# Patient Record
Sex: Female | Born: 1970 | Race: Black or African American | Hispanic: No | Smoking: Never smoker
Health system: Southern US, Community
[De-identification: ages and names within clinical notes are randomized; demographics above are authoritative.]

## PROBLEM LIST (undated history)

## (undated) ENCOUNTER — Encounter

## (undated) ENCOUNTER — Ambulatory Visit: Payer: MEDICARE | Attending: Psychologist | Primary: Psychologist

## (undated) ENCOUNTER — Encounter: Attending: Family Medicine | Primary: Family Medicine

## (undated) ENCOUNTER — Encounter: Attending: Anesthesiology | Primary: Anesthesiology

## (undated) ENCOUNTER — Encounter: Payer: MEDICARE | Attending: Clinical | Primary: Clinical

## (undated) ENCOUNTER — Telehealth: Attending: Family Medicine | Primary: Family Medicine

## (undated) ENCOUNTER — Ambulatory Visit: Payer: MEDICARE | Attending: Pain Medicine | Primary: Pain Medicine

## (undated) ENCOUNTER — Ambulatory Visit: Payer: MEDICARE

## (undated) ENCOUNTER — Telehealth

## (undated) ENCOUNTER — Encounter: Attending: Gynecology | Primary: Gynecology

## (undated) ENCOUNTER — Ambulatory Visit: Payer: MEDICARE | Attending: Family | Primary: Family

## (undated) ENCOUNTER — Ambulatory Visit: Payer: Medicare (Managed Care)

## (undated) ENCOUNTER — Encounter: Attending: Obstetrics & Gynecology | Primary: Obstetrics & Gynecology

## (undated) ENCOUNTER — Encounter: Attending: Sports Medicine | Primary: Sports Medicine

## (undated) ENCOUNTER — Ambulatory Visit
Payer: MEDICARE | Attending: Rehabilitative and Restorative Service Providers" | Primary: Rehabilitative and Restorative Service Providers"

## (undated) ENCOUNTER — Ambulatory Visit: Payer: Medicare (Managed Care) | Attending: Family Medicine | Primary: Family Medicine

## (undated) ENCOUNTER — Ambulatory Visit: Payer: MEDICARE | Attending: Internal Medicine | Primary: Internal Medicine

## (undated) ENCOUNTER — Encounter: Attending: Psychologist | Primary: Psychologist

## (undated) ENCOUNTER — Telehealth: Attending: Internal Medicine | Primary: Internal Medicine

## (undated) ENCOUNTER — Ambulatory Visit: Payer: MEDICARE | Attending: Sports Medicine | Primary: Sports Medicine

## (undated) ENCOUNTER — Ambulatory Visit

## (undated) ENCOUNTER — Ambulatory Visit: Payer: MEDICARE | Attending: Family Medicine | Primary: Family Medicine

## (undated) ENCOUNTER — Encounter: Attending: Family | Primary: Family

## (undated) ENCOUNTER — Telehealth: Attending: Sports Medicine | Primary: Sports Medicine

## (undated) ENCOUNTER — Encounter: Attending: Internal Medicine | Primary: Internal Medicine

## (undated) ENCOUNTER — Encounter: Payer: MEDICARE | Attending: Family Medicine | Primary: Family Medicine

## (undated) ENCOUNTER — Non-Acute Institutional Stay: Payer: MEDICARE

## (undated) ENCOUNTER — Encounter
Payer: MEDICARE | Attending: Rehabilitative and Restorative Service Providers" | Primary: Rehabilitative and Restorative Service Providers"

## (undated) ENCOUNTER — Encounter: Payer: MEDICARE | Attending: Gynecology | Primary: Gynecology

## (undated) ENCOUNTER — Ambulatory Visit: Payer: MEDICARE | Attending: Anesthesiology | Primary: Anesthesiology

## (undated) ENCOUNTER — Ambulatory Visit: Payer: MEDICARE | Attending: "Endocrinology | Primary: "Endocrinology

## (undated) ENCOUNTER — Encounter
Attending: Student in an Organized Health Care Education/Training Program | Primary: Student in an Organized Health Care Education/Training Program

## (undated) ENCOUNTER — Ambulatory Visit: Payer: Medicare (Managed Care) | Attending: Sports Medicine | Primary: Sports Medicine

## (undated) ENCOUNTER — Ambulatory Visit: Attending: Pain Medicine | Primary: Pain Medicine

## (undated) ENCOUNTER — Encounter: Payer: MEDICARE | Attending: "Endocrinology | Primary: "Endocrinology

## (undated) ENCOUNTER — Encounter: Payer: MEDICARE | Attending: Registered" | Primary: Registered"

## (undated) ENCOUNTER — Ambulatory Visit: Attending: Registered" | Primary: Registered"

## (undated) ENCOUNTER — Ambulatory Visit: Payer: MEDICARE | Attending: Obstetrics & Gynecology | Primary: Obstetrics & Gynecology

## (undated) ENCOUNTER — Ambulatory Visit
Payer: Medicare (Managed Care) | Attending: Rehabilitative and Restorative Service Providers" | Primary: Rehabilitative and Restorative Service Providers"

## (undated) ENCOUNTER — Ambulatory Visit: Payer: MEDICARE | Attending: Vascular Neurology | Primary: Vascular Neurology

## (undated) ENCOUNTER — Encounter: Payer: MEDICARE | Attending: Anesthesiology | Primary: Anesthesiology

## (undated) ENCOUNTER — Non-Acute Institutional Stay: Payer: MEDICARE | Attending: Family Medicine | Primary: Family Medicine

## (undated) ENCOUNTER — Encounter: Attending: "Endocrinology | Primary: "Endocrinology

## (undated) ENCOUNTER — Encounter: Attending: Pain Medicine | Primary: Pain Medicine

## (undated) ENCOUNTER — Ambulatory Visit: Payer: MEDICAID | Attending: Clinical | Primary: Clinical

## (undated) ENCOUNTER — Encounter: Payer: Medicare (Managed Care) | Attending: Internal Medicine | Primary: Internal Medicine

## (undated) ENCOUNTER — Ambulatory Visit: Attending: Clinical | Primary: Clinical

## (undated) ENCOUNTER — Encounter: Attending: Clinical | Primary: Clinical

## (undated) ENCOUNTER — Ambulatory Visit: Payer: MEDICARE | Attending: Adult Health | Primary: Adult Health

## (undated) ENCOUNTER — Telehealth
Attending: Rehabilitative and Restorative Service Providers" | Primary: Rehabilitative and Restorative Service Providers"

## (undated) ENCOUNTER — Ambulatory Visit: Payer: MEDICAID | Attending: Sports Medicine | Primary: Sports Medicine

## (undated) ENCOUNTER — Ambulatory Visit: Payer: Medicare (Managed Care) | Attending: Internal Medicine | Primary: Internal Medicine

## (undated) ENCOUNTER — Encounter: Payer: MEDICARE | Attending: Nutritionist | Primary: Nutritionist

## (undated) ENCOUNTER — Ambulatory Visit
Payer: Medicare (Managed Care) | Attending: Student in an Organized Health Care Education/Training Program | Primary: Student in an Organized Health Care Education/Training Program

## (undated) ENCOUNTER — Encounter: Payer: MEDICARE | Attending: Sports Medicine | Primary: Sports Medicine

## (undated) ENCOUNTER — Encounter: Attending: Physical Medicine & Rehabilitation | Primary: Physical Medicine & Rehabilitation

## (undated) ENCOUNTER — Encounter: Payer: Medicare (Managed Care) | Attending: Vascular Neurology | Primary: Vascular Neurology

## (undated) ENCOUNTER — Encounter: Payer: MEDICARE | Attending: Family | Primary: Family

## (undated) ENCOUNTER — Encounter: Payer: MEDICARE | Attending: Internal Medicine | Primary: Internal Medicine

## (undated) ENCOUNTER — Institutional Professional Consult (permissible substitution): Payer: MEDICARE

## (undated) ENCOUNTER — Ambulatory Visit: Attending: Pharmacist | Primary: Pharmacist

## (undated) DIAGNOSIS — G43909 Migraine, unspecified, not intractable, without status migrainosus: Secondary | ICD-10-CM

## (undated) DIAGNOSIS — E079 Disorder of thyroid, unspecified: Secondary | ICD-10-CM

## (undated) DIAGNOSIS — M549 Dorsalgia, unspecified: Secondary | ICD-10-CM

## (undated) HISTORY — PX: BREAST LUMPECTOMY: SHX2

## (undated) HISTORY — PX: BACK SURGERY: SHX140

## (undated) HISTORY — PX: ABDOMINAL HYSTERECTOMY: SHX81

---
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
## 1998-04-28 ENCOUNTER — Emergency Department (HOSPITAL_COMMUNITY): Admission: EM | Admit: 1998-04-28 | Discharge: 1998-04-28 | Payer: Self-pay | Admitting: Emergency Medicine

## 1998-05-13 ENCOUNTER — Encounter: Admission: RE | Admit: 1998-05-13 | Discharge: 1998-08-11 | Payer: Self-pay

## 1998-05-28 ENCOUNTER — Encounter: Admission: RE | Admit: 1998-05-28 | Discharge: 1998-05-28 | Payer: Self-pay | Admitting: Family Medicine

## 1998-09-19 ENCOUNTER — Other Ambulatory Visit: Admission: RE | Admit: 1998-09-19 | Discharge: 1998-09-19 | Payer: Self-pay | Admitting: *Deleted

## 1998-09-19 ENCOUNTER — Encounter: Admission: RE | Admit: 1998-09-19 | Discharge: 1998-09-19 | Payer: Self-pay | Admitting: Family Medicine

## 1998-09-23 ENCOUNTER — Encounter: Admission: RE | Admit: 1998-09-23 | Discharge: 1998-09-23 | Payer: Self-pay | Admitting: Family Medicine

## 1998-12-06 ENCOUNTER — Emergency Department (HOSPITAL_COMMUNITY): Admission: EM | Admit: 1998-12-06 | Discharge: 1998-12-06 | Payer: Self-pay | Admitting: Emergency Medicine

## 1999-02-10 ENCOUNTER — Encounter: Admission: RE | Admit: 1999-02-10 | Discharge: 1999-02-10 | Payer: Self-pay | Admitting: Sports Medicine

## 1999-03-24 ENCOUNTER — Encounter: Admission: RE | Admit: 1999-03-24 | Discharge: 1999-03-24 | Payer: Self-pay | Admitting: Sports Medicine

## 1999-03-27 ENCOUNTER — Encounter: Admission: RE | Admit: 1999-03-27 | Discharge: 1999-03-27 | Payer: Self-pay | Admitting: Family Medicine

## 1999-06-08 ENCOUNTER — Encounter: Admission: RE | Admit: 1999-06-08 | Discharge: 1999-06-08 | Payer: Self-pay | Admitting: Family Medicine

## 1999-06-25 ENCOUNTER — Emergency Department (HOSPITAL_COMMUNITY): Admission: EM | Admit: 1999-06-25 | Discharge: 1999-06-25 | Payer: Self-pay | Admitting: Emergency Medicine

## 1999-07-08 ENCOUNTER — Encounter: Admission: RE | Admit: 1999-07-08 | Discharge: 1999-07-08 | Payer: Self-pay | Admitting: Family Medicine

## 2001-06-20 ENCOUNTER — Encounter: Admission: RE | Admit: 2001-06-20 | Discharge: 2001-09-18 | Payer: Self-pay | Admitting: Anesthesiology

## 2001-07-06 ENCOUNTER — Encounter: Admission: RE | Admit: 2001-07-06 | Discharge: 2001-10-04 | Payer: Self-pay | Admitting: Anesthesiology

## 2001-12-14 ENCOUNTER — Ambulatory Visit (HOSPITAL_COMMUNITY): Admission: RE | Admit: 2001-12-14 | Discharge: 2001-12-14 | Payer: Self-pay | Admitting: Obstetrics and Gynecology

## 2001-12-14 ENCOUNTER — Encounter: Payer: Self-pay | Admitting: Obstetrics and Gynecology

## 2002-01-09 ENCOUNTER — Encounter: Admission: RE | Admit: 2002-01-09 | Discharge: 2002-01-09 | Payer: Self-pay | Admitting: Urology

## 2002-01-09 ENCOUNTER — Encounter: Payer: Self-pay | Admitting: Urology

## 2002-01-26 ENCOUNTER — Ambulatory Visit (HOSPITAL_COMMUNITY): Admission: RE | Admit: 2002-01-26 | Discharge: 2002-01-26 | Payer: Self-pay

## 2002-04-11 ENCOUNTER — Encounter (INDEPENDENT_AMBULATORY_CARE_PROVIDER_SITE_OTHER): Payer: Self-pay

## 2002-04-11 ENCOUNTER — Ambulatory Visit (HOSPITAL_COMMUNITY): Admission: RE | Admit: 2002-04-11 | Discharge: 2002-04-11 | Payer: Self-pay | Admitting: Obstetrics and Gynecology

## 2002-05-25 ENCOUNTER — Other Ambulatory Visit: Admission: RE | Admit: 2002-05-25 | Discharge: 2002-05-25 | Payer: Self-pay | Admitting: Obstetrics and Gynecology

## 2003-08-27 ENCOUNTER — Other Ambulatory Visit: Admission: RE | Admit: 2003-08-27 | Discharge: 2003-08-27 | Payer: Self-pay | Admitting: Obstetrics and Gynecology

## 2004-10-26 ENCOUNTER — Emergency Department (HOSPITAL_COMMUNITY): Admission: EM | Admit: 2004-10-26 | Discharge: 2004-10-26 | Payer: Self-pay | Admitting: Emergency Medicine

## 2005-01-09 ENCOUNTER — Emergency Department (HOSPITAL_COMMUNITY): Admission: EM | Admit: 2005-01-09 | Discharge: 2005-01-10 | Payer: Self-pay | Admitting: Emergency Medicine

## 2005-03-12 ENCOUNTER — Emergency Department (HOSPITAL_COMMUNITY): Admission: EM | Admit: 2005-03-12 | Discharge: 2005-03-12 | Payer: Self-pay | Admitting: Emergency Medicine

## 2005-08-30 ENCOUNTER — Emergency Department (HOSPITAL_COMMUNITY): Admission: EM | Admit: 2005-08-30 | Discharge: 2005-08-30 | Payer: Self-pay | Admitting: Emergency Medicine

## 2006-01-15 ENCOUNTER — Emergency Department (HOSPITAL_COMMUNITY): Admission: EM | Admit: 2006-01-15 | Discharge: 2006-01-15 | Payer: Self-pay | Admitting: Emergency Medicine

## 2006-01-17 ENCOUNTER — Emergency Department (HOSPITAL_COMMUNITY): Admission: EM | Admit: 2006-01-17 | Discharge: 2006-01-17 | Payer: Self-pay | Admitting: Emergency Medicine

## 2006-01-18 ENCOUNTER — Emergency Department (HOSPITAL_COMMUNITY): Admission: EM | Admit: 2006-01-18 | Discharge: 2006-01-19 | Payer: Self-pay | Admitting: Emergency Medicine

## 2006-04-19 ENCOUNTER — Emergency Department (HOSPITAL_COMMUNITY): Admission: EM | Admit: 2006-04-19 | Discharge: 2006-04-19 | Payer: Self-pay | Admitting: *Deleted

## 2007-02-24 ENCOUNTER — Emergency Department (HOSPITAL_COMMUNITY): Admission: EM | Admit: 2007-02-24 | Discharge: 2007-02-24 | Payer: Self-pay | Admitting: Emergency Medicine

## 2007-09-13 ENCOUNTER — Emergency Department (HOSPITAL_COMMUNITY): Admission: EM | Admit: 2007-09-13 | Discharge: 2007-09-13 | Payer: Self-pay | Admitting: Emergency Medicine

## 2007-09-22 ENCOUNTER — Ambulatory Visit (HOSPITAL_COMMUNITY): Admission: RE | Admit: 2007-09-22 | Discharge: 2007-09-22 | Payer: Self-pay | Admitting: Family Medicine

## 2007-11-11 ENCOUNTER — Emergency Department (HOSPITAL_COMMUNITY): Admission: EM | Admit: 2007-11-11 | Discharge: 2007-11-11 | Payer: Self-pay | Admitting: Emergency Medicine

## 2008-04-11 ENCOUNTER — Ambulatory Visit (HOSPITAL_COMMUNITY): Admission: RE | Admit: 2008-04-11 | Discharge: 2008-04-11 | Payer: Self-pay | Admitting: Obstetrics

## 2008-07-06 ENCOUNTER — Emergency Department (HOSPITAL_COMMUNITY): Admission: EM | Admit: 2008-07-06 | Discharge: 2008-07-06 | Payer: Self-pay | Admitting: Emergency Medicine

## 2008-08-20 ENCOUNTER — Encounter
Admission: RE | Admit: 2008-08-20 | Discharge: 2008-10-02 | Payer: Self-pay | Admitting: Physical Medicine and Rehabilitation

## 2008-09-04 ENCOUNTER — Encounter: Admission: RE | Admit: 2008-09-04 | Discharge: 2008-09-04 | Payer: Self-pay | Admitting: Neurology

## 2009-03-17 ENCOUNTER — Emergency Department (HOSPITAL_COMMUNITY): Admission: EM | Admit: 2009-03-17 | Discharge: 2009-03-18 | Payer: Self-pay | Admitting: Emergency Medicine

## 2009-08-05 ENCOUNTER — Encounter
Admission: RE | Admit: 2009-08-05 | Discharge: 2009-08-05 | Payer: Self-pay | Admitting: Physical Medicine and Rehabilitation

## 2010-06-12 NOTE — Consult Note (Signed)
[facility]  Patient:    Alexandria Hobbs, Alexandria Hobbs Visit Number: [id number] MRN: [id number]          Service Type: PMG Location: TPC Attending Physician:  Rolly Salter Dictated by:   Jewel Baize Stevphen Rochester, M.D. Admit Date:  06/21/2001                            Consultation Report  Alexandria [facility] and I evaluate and review health and history form, 14 point review of systems.  I review her MRI which is essentially normal.  She does have some facetal overlay and modest degenerative changes but I do not see any discogenic component or over neurologic peril.  She does have persistent myofascial pain most notably left greater than right.  She does have a right-sided component that extends to levator scapular.  I am going to go ahead and continue to treat her expectantly.  TENS technology will be trialed, home based therapy is discussed, and I do think trigger point injection is a reasonable approach.  We will trial this suprascapular, follow up with her in two to three weeks, and if she does not have significant improvement would consider moving toward facetal injection.  Our goal is to maintain a very progressive movement forward as she is a young individual.  I do want her to maintain a direction as to reintroduction to the work force. Do not believe she is a long-term disability candidate.  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  She does not have any neurological features or memory disturbance.  Has been treated with conservative therapy and done well.  She does not have any neurological advancement as described.  Myofascial pain seems to be most problematic.  Instructed to maintain contact with Dr. Dorena Bodo at [facility] and predicate any further treatment on conservative management, progress through our treatment profile, and  lifestyle enhancements.  OBJECTIVE:  BACK:  She has diffuse paracervical myofascial discomfort, impaired flexion/extension, lateral rotational pain, positive cervical facetal compression test left greater than right.  NEUROLOGIC:  She has no other overt neurologic deficit, motor, sensory, or reflexive.  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  PLAN:  Trigger point injection.  She has consented.  The skin is prepped.  Using 5 cc of lidocaine and 10 mg of Aristocort, I inject suprascapular right and left side with a levator scapular injection right and left-sided independent needle access points.  This is in divided dose.  Tolerates procedure well.  Assess within the context of activities of daily living.  Will see her in follow-up.  Discharge instructions given. Dictated by:   Jewel Baize Stevphen Rochester, M.D. Attending Physician:  Rolly Salter DD:  07/04/01 TD:  07/06/01 Job: [id number]

## 2010-06-12 NOTE — H&P (Signed)
NAME:  Alexandria Hobbs, Alexandria Hobbs                       ACCOUNT NO.:  [id number]   MEDICAL RECORD NO.:  [id number]                   PATIENT TYPE:  AMB   LOCATION:  SDC                                  FACILITY:  [facility]   PHYSICIAN:  Naima A. Dillard, M.D.              DATE OF BIRTH:  24-Nov-1970   DATE OF ADMISSION:  DATE OF DISCHARGE:                                HISTORY & PHYSICAL   CHIEF COMPLAINT:  Chronic pelvic pain.   HISTORY AND PHYSICAL:  The patient is a 40 year old African-American female  gravida 2, para 2 who has had pelvic pain that has been intermittent for a  year.  In November 2003 patient developed left lower quadrant pain that  worsened with menses.  The patient states that she always had dysmenorrhea  but this time it became worse than ever.  The patient denies any current  vaginal discharge, odor, any fever.  She does have some dyspareunia.  She  denies any UTI symptoms.  She does have a history of kidney stones.  She  denies having any constipation, diarrhea, or rectal bleeding.  She denies  having any nausea, vomiting.  The patient has no chance she could be  pregnant.  Denies any history of endometriosis, fibroids.  The patient has  had ultrasounds in the past which showed an ovarian cyst which resolved.  The patient did have Chlamydia in the past in which she was treated.   PAST MEDICAL HISTORY:  1. Chronic back pain.  2. Arthritis in her knee.  3. Tendinitis in her shoulder.   PAST SURGICAL HISTORY:  1. Laparoscopic tubal ligation.  2. Breast biopsy x2, both benign.  3. Back spinal fusion of L5 and S1.   PAST OBSTETRICAL HISTORY:  Significant for full-term vaginal deliveries x2  without any complications.   PAST GYNECOLOGIC HISTORY:  Significant for menarche at age 61 occurring  every 28 days lasting for two to three days.  She had Chlamydia at the age  of 10 which was treated.  No history of abnormal Pap smear.   FAMILY HISTORY:  Significant for diabetes  and hypertension and a history of  ovarian cancer in her maternal grandmother.   SOCIAL HISTORY:  Negative x3.   ALLERGIES:  The patient is allergic to PERCOCET.   MEDICATIONS:  1. Fentanyl patch.  2. Celebrex.  3. Neurontin.  4. Baclofen.   REVIEW OF SYSTEMS:  ENDOCRINE:  Unremarkable.  CARDIOVASCULAR:  Unremarkable.  GASTROINTESTINAL:  Unremarkable.  MUSCULOSKELETAL:  Significant for chronic back pain and arthritis.  GENITOURINARY:  Chronic  pelvic pain and history of kidney stones.  PSYCHIATRY:  Unremarkable.   PERTINENT LABORATORY DATA:  She had an ultrasound in February 2004 which  showed a normal sized retroverted uterus with normal ovaries and no masses.  She had gonorrhea and Chlamydia cultures done in January of 2004 which were  negative.  She did see Lindaann Slough, M.D.  for an evaluation of kidney  stones.  The patient feels that this pain that she has is definitely  different from the pelvic pain and is not associated with dysmenorrhea.  She  had a Pap smear in February of 2002 which was normal.   ASSESSMENT:  Chronic pelvic pain.   PLAN:  The patient was offered other medical treatments as well as Lupron  versus a diagnostic laparoscopy.  The patient chose diagnostic laparoscopy.  She understands that the risks are, but not limited to, bleeding, infection,  damage to internal organs such as bowel, bladder, major blood vessels.                                               Naima A. Normand Sloop, M.D.    NAD/MEDQ  D:  04/10/2002  T:  04/10/2002  Job:  [id number]

## 2010-06-12 NOTE — Op Note (Signed)
[facility]  Patient:    Alexandria Hobbs, Alexandria Hobbs Visit Number: [id number] MRN: [id number]          Service Type: PMG Location: TPC Attending Physician:  Rolly Salter Dictated by:   Jewel Baize Stevphen Rochester, M.D. Admit Date:  06/21/2001                             Operative Report  The patient comes in for pain management today. I evaluated her healthy and history form and 14-point review of systems.  1. The patient has done very well with the conservative management but    continues to breakthrough. Originally considered for cervical facetal    injection. She does have bilateral symptoms and I think in her best    interests, it would probably be reasonable to start with the cervical    epidural, low-dose steroid, prior to moving towards facetal injection. We    would consider a facetal injection if she significantly lateralizes, but    she has adequate range of motion, really not as much pain on extension, and    describes more of a global presentation. 2. I do not believe further imaging or diagnostics are warranted. 3. Instructed to maintain contact with Dr. Otho Ket.  Objectively, diffuse paracervical myofascial discomfort, impaired flexor, extensor, and lateral rotational pain. Positive cervical facetal compression test, right greater than left but no new neurological features, motor and sensory are reflexive.  IMPRESSION: 1. Cervicalgia, degenerative spinal disease, cervical spine. 2. Cervical facet syndrome.  PLAN:  Cervical epidural. Assess within the context of activities of daily living, predicated any further injection based on need and responsiveness. We are mindful of steroid exposure. She is consented.  PROCEDURE:  The patient taken to the procedure area and placed in the upright position, neck prepped and draped in the usual fashion. Using a Hustead needle, I advanced to C5-6 interspace. No evidence of CSF, heme, or paresthesia. Test block  uneventfully, followed 20 mg of Aristocort in a fresh needle.  Tolerated the procedure well. No complications from operable procedure. Discharge instructions are given. Dictated by:   Jewel Baize Stevphen Rochester, M.D. Attending Physician:  Rolly Salter DD:  07/25/01 TD:  07/27/01 Job: [id number]

## 2010-06-12 NOTE — Op Note (Signed)
NAME:  Alexandria Hobbs, Alexandria Hobbs                       ACCOUNT NO.:  [id number]   MEDICAL RECORD NO.:  [id number]                   PATIENT TYPE:  AMB   LOCATION:  SDC                                  FACILITY:  [facility]   PHYSICIAN:  Naima A. Dillard, M.D.              DATE OF BIRTH:  22-Oct-1970   DATE OF PROCEDURE:  04/11/2002  DATE OF DISCHARGE:                                 OPERATIVE REPORT   PREOPERATIVE DIAGNOSIS:  Chronic pelvic pain.   POSTOPERATIVE DIAGNOSIS:  Chronic pelvic pain.   PROCEDURE:  1. Diagnostic laparoscopy.  2. Biopsy of posterior cul-de-sac.   SURGEON:  Naima A. Normand Sloop, M.D.   ANESTHESIA:  General endotracheal tube.   COMPLICATIONS:  None.   ESTIMATED BLOOD LOSS:  Minimal.   IV FLUIDS:  1100 mL crystalloid.   URINE OUTPUT:  400 mL clear urine.   FINDINGS:  Normal appearing uterus, tubes, and ovaries bilaterally.  There  is a _______ in the posterior cul-de-sac.  Normal appendix.  Normal  abdominal anatomy.  Normal uterosacral ligaments.  The patient returned to  recovery room in stable condition.   PROCEDURE IN DETAIL:  The patient taken to the operating room where she was  given general anesthesia.  Placed in dorsal lithotomy position.  Prepped and  draped in a normal sterile fashion.  A bivalve speculum was placed into the  vagina.  The anterior lip of the cervix was grasped with a single tooth  tenaculum.  A Hulka tenaculum was placed into the uterine cavity.  The  single tooth tenaculum and speculum was removed.  Attention was then turned  to the patient's abdomen where 5 mL 0.25% Marcaine was used in  infraumbilical area.  A 10 mm incision was then made with a scalpel.  The  Veress needle was placed at a 45 degree angle while tenting abdominal wall.  Intra-abdominal placement was confirmed with fluid filled syringe and  decrease in CO2 pressure.  Abdomen was insufflated with 3 L CO2 gas.  The  Veress needle was removed.  10 mm trocars placed into  the abdominal cavity.  Intra-abdominal placement is confirmed with the laparoscope.  Findings noted  above were seen.  A biopsy was taken of the posterior cul-de-sac.  The area  did have some bleeding.  It was irrigated with saline and then fulgurated  with Kleppingers x3.  Hemostasis was noted.  Area was again irrigated and  good hemostasis noted.  All instruments were  removed from the abdomen.  The gas was allowed to leave the abdomen.  The  trocar was removed under direct visualization.  The fascia was closed with 0  Vicryl.  The skin was closed with 3-0 Vicryl in a subcuticular fashion.  Sponge, lap, and needle counts were correct x2.  The patient went to  recovery room in stable condition.  Naima A. Normand Sloop, M.D.    NAD/MEDQ  D:  04/11/2002  T:  04/11/2002  Job:  [id number]

## 2010-06-12 NOTE — Consult Note (Signed)
[facility]  Patient:    Alexandria Hobbs, Alexandria Hobbs Visit Number: [id number] MRN: [id number]          Service Type: PMG Location: TPC Attending Physician:  Rolly Salter Dictated by:   Jewel Baize Stevphen Rochester, M.D. Proc. Date: 08/15/01 Admit Date:  06/21/2001   CC:         Saverio Danker, M.D., [facility]   Consultation Report  BRIEF HISTORY:  The patient comes in for pre management today.  I evaluated her health and history form and 14 point review of systems.  1. The patient has done fairly well with cervical epidurals.  The patient has    adequate range of motion, but is still troubled by suprascapular    discomfort.  She has full range of motion and I do not think she has a    clear classic facetal overlay, but it could be present as suspected    etiology of her pain, as compression test is positive. 2. She does require some breakthrough medications.  I think it is reasonable    she has a few at home as she uses her medications sparingly and    appropriately.  Dr. Otho Ket will determine the course of care after she    returns to him in the next couple of weeks, and I am wondering if we should    not at least trial a single cervical facet injection.  The rational would    be to move to neurotomy as prolonged release cycling may be obtained this    route in an otherwise healthy functional individual. 3. I do not believe she is a disability candidate.  I would like her to    continue with vocational rehabilitation, and move into the work force if    possible.  I have asked her to discuss this with Dr. Saverio Danker as well.    Dr. Otho Ket will also determine if she is a surgical candidate.  If not,    would consider facetal injection.  Objectively, diffuse paracervical myofascial discomfort, impaired flexion and extension, and lateral rotational pain, and positive cervical facetal compression test, left greater than right.  No other overt motor  neurological deficit or motor sensory reflexes.  IMPRESSION:  Cervicalgia and degenerative spinal disease of cervical spine, and cervical facets.  PLAN:  Cervical facet injection if non surgical.  Norco for breakthrough pain to be used sparingly and appropriately.  Follow up with Dr. Otho Ket for further directed care. Dictated by:   Jewel Baize Stevphen Rochester, M.D. Attending Physician:  Rolly Salter DD:  08/15/01 TD:  08/18/01 Job: [id number]

## 2010-06-12 NOTE — Consult Note (Signed)
[facility]  Patient:    Alexandria Hobbs, Alexandria Hobbs Visit Number: [id number] MRN: [id number]          Service Type: PMG Location: TPC Attending Physician:  Rolly Salter Dictated by:   Jewel Baize Stevphen Rochester, M.D. Admit Date:  06/21/2001   CC:         Dorena Bodo, M.D.; [facility]   Consultation Report  DATE OF BIRTH:  12/29/1970  Alexandria Hobbs is a pleasant patient kindly referred to [place] by Dr. Rayna Sexton at [facility].  Alexandria Hobbs is a pleasant 40 year old female who comes to [place] complaining of left shoulder and neck pain.  She relates her pain as a 6/10 on a subjective scale, dull, achy, and throbbing and she has had this for a number of years.  She seems to be progressively declining in functional capacity and restored sleep capacity as well as lateral rotational capacity, most notably to the left.  She has also had low back problems that had been treated with facetal injection and surgical consideration.  She is improved in the paralumbar perspective, most problematic is her cervical perspective at todays visit.  She is relating her pain as a 6/10 on a subjective scale, dull, achy, and throbbing, sometimes burning and stabbing.  It is constant. Directed right and left, left greater than right with suprascapular amplification.  Her paralumbar pain is primarily directed left.  It goes to the great toe, but is not as problematic and is not primary complaint today. She is improved with medications.  She is made worse by walking, bending, and sitting.  MRI and x-rays have been obtained.  I review x-rays but she does not have her MRI with her today.  We ask her to obtain this.  She is relating no specific injury.  MEDICATIONS:  Flexeril, Bextra, Neurontin, Ultram.  ALLERGIES:  No known drug allergies.  States no wish to harm self or others.  The 14 point review of systems, health and history form reviewed.  Remarkable for headaches which she  has been evaluated.  She has had breast biopsy both sides benign, tubal ligation, and lumbar laminectomy with some relief cycling.  PAST MEDICAL HISTORY:  Otherwise denied.  FAMILY HISTORY:  Remarkable for hypertension, disability, diabetes, lung cancer, and heart disease.  SOCIAL HISTORY:  Nonsmoker, nondrinker.  She is married.  She is currently not working.  She is disabled secondary to her low back disease and has not been involved in vocational retraining.  REVIEW OF SYSTEMS:  Otherwise noncontributory to the pain problem.  PHYSICAL EXAMINATION  GENERAL:  Pleasant female sitting comfortably in bed.  Gait, affect, appearance are normal.  Oriented x3.  HEENT:  Unremarkable.  CHEST:  Clear to auscultation and percussion.  HEART:  Regular rate and rhythm without rub, murmur, or gallop.  ABDOMEN:  Soft, nontender, benign.  No hepatosplenomegaly.  BACK:  She has diffuse paracervical, suprascapular, paralumbar myofascial discomfort.  She is distractable to suprascapular rhomboideae components and paralumbar pain notable over PSIS.  Gaenslens and Patricks equivocal.  NEUROLOGIC:  Intact motor, sensory, reflexive.  IMPRESSION:  Cervicalgia, degenerative spinal disease cervical spine, cervical facet syndrome, degenerative spinal disease lumbar spine.  PLAN: 1. Would recommend the patient seek some type of gainful reintroduction into    employment secondary to her age and her overall presentation.  She remains    on non-narcotic medication alternatives which is encouraging. 2. Instructed to maintain contact with Dr. Rayna Sexton. 3. I will evaluate her MRI  and consider interventional procedure if warranted. 4. I review this with her. 5. Consider conservative management approach.  Lifestyle enhancements    discussed.  Will see her in follow-up to bring MRI for assessment.    Discharge instructions given. Dictated by:   Jewel Baize Stevphen Rochester, M.D. Attending Physician:  Rolly Salter DD:  06/20/01 TD:  06/21/01 Job: [id number]

## 2010-07-15 ENCOUNTER — Emergency Department (HOSPITAL_COMMUNITY)
Admission: EM | Admit: 2010-07-15 | Discharge: 2010-07-15 | Disposition: A | Discharge status: Home / Self Care | Payer: Medicare Other | Attending: Emergency Medicine | Admitting: Emergency Medicine

## 2010-07-15 DIAGNOSIS — M129 Arthropathy, unspecified: Secondary | ICD-10-CM | POA: Insufficient documentation

## 2010-07-15 DIAGNOSIS — E876 Hypokalemia: Secondary | ICD-10-CM | POA: Insufficient documentation

## 2010-07-15 DIAGNOSIS — R197 Diarrhea, unspecified: Secondary | ICD-10-CM | POA: Insufficient documentation

## 2010-07-15 DIAGNOSIS — R112 Nausea with vomiting, unspecified: Secondary | ICD-10-CM | POA: Insufficient documentation

## 2010-07-15 DIAGNOSIS — G932 Benign intracranial hypertension: Secondary | ICD-10-CM | POA: Insufficient documentation

## 2010-07-15 LAB — URINALYSIS, ROUTINE W REFLEX MICROSCOPIC
Bilirubin Urine: NEGATIVE
Leukocytes, UA: NEGATIVE
Nitrite: NEGATIVE
Specific Gravity, Urine: 1.029 (ref 1.005–1.030)
Urobilinogen, UA: 0.2 mg/dL (ref 0.0–1.0)

## 2010-07-15 LAB — COMPREHENSIVE METABOLIC PANEL
AST: 26 U/L (ref 0–37)
Albumin: 4.1 g/dL (ref 3.5–5.2)
Alkaline Phosphatase: 62 U/L (ref 39–117)
BUN: 13 mg/dL (ref 6–23)
CO2: 17 mEq/L — ABNORMAL LOW (ref 19–32)
Chloride: 109 mEq/L (ref 96–112)
Creatinine, Ser: 0.66 mg/dL (ref 0.50–1.10)
GFR calc non Af Amer: >60 mL/min (ref >60)
Potassium: 2.8 mEq/L — ABNORMAL LOW (ref 3.5–5.1)
Total Bilirubin: 0.5 mg/dL (ref 0.3–1.2)

## 2010-07-15 LAB — CBC
MCV: 93.1 fL (ref 78.0–100.0)
Platelets: 159 10*3/uL (ref 150–400)
RBC: 4.52 MIL/uL (ref 3.87–5.11)
WBC: 11.9 10*3/uL — ABNORMAL HIGH (ref 4.0–10.5)

## 2010-07-15 LAB — DIFFERENTIAL
Basophils Relative: 0 % (ref 0–1)
Eosinophils Absolute: 0 10*3/uL (ref 0.0–0.7)
Eosinophils Relative: 0 % (ref 0–5)
Lymphocytes Relative: 4 % — ABNORMAL LOW (ref 12–46)
Neutro Abs: 11 10*3/uL — ABNORMAL HIGH (ref 1.7–7.7)

## 2010-07-15 LAB — URINE MICROSCOPIC-ADD ON

## 2010-08-01 ENCOUNTER — Emergency Department (HOSPITAL_COMMUNITY): Payer: Medicare Other

## 2010-08-01 ENCOUNTER — Emergency Department (HOSPITAL_COMMUNITY)
Admission: EM | Admit: 2010-08-01 | Discharge: 2010-08-02 | Disposition: A | Discharge status: Home / Self Care | Payer: Medicare Other | Attending: Emergency Medicine | Admitting: Emergency Medicine

## 2010-08-01 DIAGNOSIS — R079 Chest pain, unspecified: Secondary | ICD-10-CM | POA: Insufficient documentation

## 2010-08-01 DIAGNOSIS — R5383 Other fatigue: Secondary | ICD-10-CM | POA: Insufficient documentation

## 2010-08-01 DIAGNOSIS — E039 Hypothyroidism, unspecified: Secondary | ICD-10-CM | POA: Insufficient documentation

## 2010-08-01 DIAGNOSIS — G932 Benign intracranial hypertension: Secondary | ICD-10-CM | POA: Insufficient documentation

## 2010-08-01 DIAGNOSIS — M549 Dorsalgia, unspecified: Secondary | ICD-10-CM | POA: Insufficient documentation

## 2010-08-01 DIAGNOSIS — R5381 Other malaise: Secondary | ICD-10-CM | POA: Insufficient documentation

## 2010-08-01 DIAGNOSIS — R05 Cough: Secondary | ICD-10-CM | POA: Insufficient documentation

## 2010-08-01 DIAGNOSIS — M129 Arthropathy, unspecified: Secondary | ICD-10-CM | POA: Insufficient documentation

## 2010-08-01 DIAGNOSIS — R059 Cough, unspecified: Secondary | ICD-10-CM | POA: Insufficient documentation

## 2010-08-01 DIAGNOSIS — R509 Fever, unspecified: Secondary | ICD-10-CM | POA: Insufficient documentation

## 2010-08-01 DIAGNOSIS — G8929 Other chronic pain: Secondary | ICD-10-CM | POA: Insufficient documentation

## 2011-06-29 ENCOUNTER — Emergency Department (HOSPITAL_COMMUNITY)
Admission: EM | Admit: 2011-06-29 | Discharge: 2011-06-30 | Disposition: A | Discharge status: Home / Self Care | Payer: Medicare Other | Attending: Emergency Medicine | Admitting: Emergency Medicine

## 2011-06-29 ENCOUNTER — Encounter (HOSPITAL_COMMUNITY): Payer: Self-pay | Admitting: *Deleted

## 2011-06-29 DIAGNOSIS — H579 Unspecified disorder of eye and adnexa: Secondary | ICD-10-CM | POA: Insufficient documentation

## 2011-06-29 DIAGNOSIS — J302 Other seasonal allergic rhinitis: Secondary | ICD-10-CM

## 2011-06-29 MED ORDER — FEXOFENADINE-PSEUDOEPHED ER 60-120 MG PO TB12: 1.0000 | ORAL_TABLET | Freq: Two times a day (BID) | ORAL | Status: DC

## 2011-06-29 MED ORDER — IBUPROFEN 200 MG PO TABS
600.0000 mg | ORAL_TABLET | Freq: Once | ORAL | Status: AC
  Administered 2011-06-30: 600 mg via ORAL
  Filled 2011-06-29: qty 3

## 2011-06-29 NOTE — ED Provider Notes (Signed)
History     CSN: [id number]  Arrival date & time 06/29/11  2122   First MD Initiated Contact with Patient 06/29/11 2306      Chief Complaint  Patient presents with  . Pruritis  . Eye Drainage    (Consider location/radiation/quality/duration/timing/severity/associated sxs/prior treatment) HPI Comments: Patient reports that she has a history of seasonal allergies.  She has been taking 1/2 tablet of Benadryl daily at night, but does not feel that it is helping.  She reports that in the past her PCP has prescribed Allegra for the allergies, which has helped.  She is requesting a Rx for Allegra.  She reports that she has been having bilateral itchy watery eyes, nasal congestion, and rhinorrhea for the past couple of weeks, which has been worse over the past four days.  She denies any SOB or wheezing.  Denies sinus pain or pressure.  No rash.  The history is provided by the patient.    History reviewed. No pertinent past medical history.  Past Surgical History  Procedure Date  . Back surgery     spinal fusion L4-L5-S1  . Abdominal hysterectomy     History reviewed. No pertinent family history.  History  Substance Use Topics  . Smoking status: Not on file  . Smokeless tobacco: Not on file  . Alcohol Use: No    OB History    Grav Para Term Preterm Abortions TAB SAB Ect Mult Living                  Review of Systems  Constitutional: Negative for fever and chills.  HENT: Positive for congestion, rhinorrhea and sneezing. Negative for sore throat, facial swelling and sinus pressure.   Eyes: Positive for itching.  Respiratory: Negative for chest tightness, shortness of breath and wheezing.   Skin: Negative for rash.    Allergies  Percocet  Home Medications   Current Outpatient Rx  Name Route Sig Dispense Refill  . ACETAZOLAMIDE 250 MG PO TABS Oral Take 250 mg by mouth 3 (three) times daily.    Marland Kitchen BACLOFEN 10 MG PO TABS Oral Take 10 mg by mouth 3 (three) times daily.      Marland Kitchen ESTRACE PO Oral Take 2 tablets by mouth daily. The patient was not able to give medication dose. Pharmacy to be called 06/30/11 for information.    Marland Kitchen FEXOFENADINE HCL 180 MG PO TABS Oral Take 180 mg by mouth daily.    Marland Kitchen GABAPENTIN 300 MG PO CAPS Oral Take 300 mg by mouth at bedtime.    Marland Kitchen LEVOTHYROXINE SODIUM 25 MCG PO TABS Oral Take 25 mcg by mouth daily.      BP 115/67  Pulse 56  Temp(Src) 97 F (36.1 C) (Oral)  Resp 18  Ht 5\' 1"  (1.549 m)  Wt 128 lb (58.06 kg)  BMI 24.19 kg/m2  SpO2 100%  Physical Exam  Nursing note and vitals reviewed. Constitutional: She appears well-developed and well-nourished. No distress.  HENT:  Head: Normocephalic and atraumatic.  Right Ear: Tympanic membrane and ear canal normal.  Left Ear: Tympanic membrane and ear canal normal.  Nose: Mucosal edema and rhinorrhea present. Right sinus exhibits no maxillary sinus tenderness and no frontal sinus tenderness. Left sinus exhibits no maxillary sinus tenderness and no frontal sinus tenderness.  Mouth/Throat: Uvula is midline, oropharynx is clear and moist and mucous membranes are normal.  Eyes: EOM are normal. Pupils are equal, round, and reactive to light. Right eye exhibits no discharge. Left eye  exhibits no discharge.  Neck: Normal range of motion. Neck supple.  Cardiovascular: Normal rate, regular rhythm and normal heart sounds.   Pulmonary/Chest: Effort normal and breath sounds normal. No respiratory distress. She has no wheezes.  Neurological: She is alert.  Skin: Skin is warm and dry. No rash noted. She is not diaphoretic.  Psychiatric: She has a normal mood and affect.    ED Course  Procedures (including critical care time)  Labs Reviewed - No data to display No results found.   No diagnosis found.    MDM  Patient with a history of seasonal allergies comes in today with a chief complaint of itchy, watery eyes, sneezing, and congestion.  She is requesting a Rx for Allegra since it has  helped in the past.  Patient given prescription and discharged home.          Pascal Lux Morral, PA-C 06/30/11 [phone]

## 2011-06-29 NOTE — Discharge Instructions (Signed)
Allergies, Generic Allergies may happen from anything your body is sensitive to. This may be food, medicines, pollens, chemicals, and nearly anything around you in everyday life that produces allergens. An allergen is anything that causes an allergy producing substance. Heredity is often a factor in causing these problems. This means you may have some of the same allergies as your parents. Food allergies happen in all age groups. Food allergies are some of the most severe and life threatening. Some common food allergies are cow's milk, seafood, eggs, nuts, wheat, and soybeans. SYMPTOMS   Swelling around the mouth.   An itchy red rash or hives.   Vomiting or diarrhea.   Difficulty breathing.  SEVERE ALLERGIC REACTIONS ARE LIFE-THREATENING. This reaction is called anaphylaxis. It can cause the mouth and throat to swell and cause difficulty with breathing and swallowing. In severe reactions only a trace amount of food (for example, peanut oil in a salad) may cause death within seconds. Seasonal allergies occur in all age groups. These are seasonal because they usually occur during the same season every year. They may be a reaction to molds, grass pollens, or tree pollens. Other causes of problems are house dust mite allergens, pet dander, and mold spores. The symptoms often consist of nasal congestion, a runny itchy nose associated with sneezing, and tearing itchy eyes. There is often an associated itching of the mouth and ears. The problems happen when you come in contact with pollens and other allergens. Allergens are the particles in the air that the body reacts to with an allergic reaction. This causes you to release allergic antibodies. Through a chain of events, these eventually cause you to release histamine into the blood stream. Although it is meant to be protective to the body, it is this release that causes your discomfort. This is why you were given anti-histamines to feel better. If you are  unable to pinpoint the offending allergen, it may be determined by skin or blood testing. Allergies cannot be cured but can be controlled with medicine. Hay fever is a collection of all or some of the seasonal allergy problems. It may often be treated with simple over-the-counter medicine such as diphenhydramine. Take medicine as directed. Do not drink alcohol or drive while taking this medicine. Check with your caregiver or package insert for child dosages. If these medicines are not effective, there are many new medicines your caregiver can prescribe. Stronger medicine such as nasal spray, eye drops, and corticosteroids may be used if the first things you try do not work well. Other treatments such as immunotherapy or desensitizing injections can be used if all else fails. Follow up with your caregiver if problems continue. These seasonal allergies are usually not life threatening. They are generally more of a nuisance that can often be handled using medicine. HOME CARE INSTRUCTIONS   If unsure what causes a reaction, keep a diary of foods eaten and symptoms that follow. Avoid foods that cause reactions.   If hives or rash are present:   Take medicine as directed.   You may use an over-the-counter antihistamine (diphenhydramine) for hives and itching as needed.   Apply cold compresses (cloths) to the skin or take baths in cool water. Avoid hot baths or showers. Heat will make a rash and itching worse.   If you are severely allergic:   Following a treatment for a severe reaction, hospitalization is often required for closer follow-up.   Wear a medic-alert bracelet or necklace stating the allergy.     You and your family must learn how to give adrenaline or use an anaphylaxis kit.   If you have had a severe reaction, always carry your anaphylaxis kit or EpiPen with you. Use this medicine as directed by your caregiver if a severe reaction is occurring. Failure to do so could have a fatal  outcome.  SEEK MEDICAL CARE IF:  You suspect a food allergy. Symptoms generally happen within 30 minutes of eating a food.   Your symptoms have not gone away within 2 days or are getting worse.   You develop new symptoms.   You want to retest yourself or your child with a food or drink you think causes an allergic reaction. Never do this if an anaphylactic reaction to that food or drink has happened before. Only do this under the care of a caregiver.  SEEK IMMEDIATE MEDICAL CARE IF:   You have difficulty breathing, are wheezing, or have a tight feeling in your chest or throat.   You have a swollen mouth, or you have hives, swelling, or itching all over your body.   You have had a severe reaction that has responded to your anaphylaxis kit or an EpiPen. These reactions may return when the medicine has worn off. These reactions should be considered life threatening.  MAKE SURE YOU:   Understand these instructions.   Will watch your condition.   Will get help right away if you are not doing well or get worse.  Document Released: 04/06/2002 Document Revised: 12/31/2010 Document Reviewed: 09/11/2007 [employer] Patient Information 2012 [employer]. 

## 2011-06-29 NOTE — ED Notes (Addendum)
Pt c/o allergy symptoms since Saturday. Reports itchy, watery eyes, runny nose, sneezing. Reports no relief with home medication. Pt in NAD. Airway intact. No labored or difficulty breathing noted.

## 2011-06-30 NOTE — ED Notes (Signed)
Patient discharge via ambulatory with steady gait. Respirations equal and unlabored. Skin warm and dry. No acute distress noted. 

## 2011-06-30 NOTE — ED Provider Notes (Signed)
Medical screening examination/treatment/procedure(s) were performed by non-physician practitioner and as supervising physician I was immediately available for consultation/collaboration.    Malakhai Beitler R Shalev Helminiak, MD 06/30/11 1103 

## 2011-08-23 ENCOUNTER — Emergency Department (HOSPITAL_COMMUNITY)
Admission: EM | Admit: 2011-08-23 | Discharge: 2011-08-23 | Disposition: A | Discharge status: Home / Self Care | Payer: Medicare Other | Attending: Emergency Medicine | Admitting: Emergency Medicine

## 2011-08-23 ENCOUNTER — Encounter (HOSPITAL_COMMUNITY): Payer: Self-pay | Admitting: Emergency Medicine

## 2011-08-23 DIAGNOSIS — R05 Cough: Secondary | ICD-10-CM

## 2011-08-23 DIAGNOSIS — J069 Acute upper respiratory infection, unspecified: Secondary | ICD-10-CM | POA: Insufficient documentation

## 2011-08-23 MED ORDER — PSEUDOEPHEDRINE HCL ER 120 MG PO TB12: 120.0000 mg | ORAL_TABLET | ORAL | Status: DC

## 2011-08-23 MED ORDER — PSEUDOEPHEDRINE HCL ER 120 MG PO TB12
120.0000 mg | ORAL_TABLET | Freq: Two times a day (BID) | ORAL | Status: DC
  Administered 2011-08-23: 120 mg via ORAL
  Filled 2011-08-23 (×2): qty 1

## 2011-08-23 MED ORDER — BENZONATATE 100 MG PO CAPS
100.0000 mg | ORAL_CAPSULE | Freq: Once | ORAL | Status: AC
  Administered 2011-08-23: 100 mg via ORAL
  Filled 2011-08-23: qty 1

## 2011-08-23 MED ORDER — BENZONATATE 100 MG PO CAPS: 100.0000 mg | ORAL_CAPSULE | Freq: Two times a day (BID) | ORAL | Status: AC | PRN

## 2011-08-23 NOTE — ED Notes (Signed)
Pt alert, arrives from home, c/o "reaaly bad cough", onset was several days ago, resp even unlabored, skin pwd

## 2011-08-23 NOTE — ED Provider Notes (Signed)
History     CSN: [id number]  Arrival date & time 08/23/11  4098   First MD Initiated Contact with Patient 08/23/11 0502      Chief Complaint  Patient presents with  . URI    (Consider location/radiation/quality/duration/timing/severity/associated sxs/prior treatment) HPI Comments: Patient has had URI symptoms for the past 2, days.  She been taking over-the-counter toxin for her cough, which worsens at night, while she is lying back.  Denies fever, shortness of breath, but states there's something loose in my chest, although her cough is nonproductive.  She does not have a history of heart failure, asthma, COPD  The history is provided by the patient.    History reviewed. No pertinent past medical history.  Past Surgical History  Procedure Date  . Back surgery     spinal fusion L4-L5-S1  . Abdominal hysterectomy     No family history on file.  History  Substance Use Topics  . Smoking status: Not on file  . Smokeless tobacco: Not on file  . Alcohol Use: No    OB History    Grav Para Term Preterm Abortions TAB SAB Ect Mult Living                  Review of Systems  Constitutional: Negative for fever.  HENT: Positive for rhinorrhea and postnasal drip. Negative for ear pain, congestion and sneezing.   Respiratory: Negative for shortness of breath.   Gastrointestinal: Negative for nausea.  Neurological: Negative for dizziness, weakness and numbness.    Allergies  Percocet  Home Medications   Current Outpatient Rx  Name Route Sig Dispense Refill  . ACETAZOLAMIDE 250 MG PO TABS Oral Take 250 mg by mouth 3 (three) times daily.    Marland Kitchen BACLOFEN 10 MG PO TABS Oral Take 10 mg by mouth 3 (three) times daily.    Marland Kitchen ESTRADIOL 1 MG PO TABS Oral Take 1.5 mg by mouth daily.    Marland Kitchen FEXOFENADINE-PSEUDOEPHED ER 60-120 MG PO TB12 Oral Take 1 tablet by mouth every 12 (twelve) hours. 30 tablet 0  . GABAPENTIN 300 MG PO CAPS Oral Take 300 mg by mouth at bedtime.    Marland Kitchen LEVOTHYROXINE  SODIUM 25 MCG PO TABS Oral Take 25 mcg by mouth daily.    . OXYCODONE-ACETAMINOPHEN 10-325 MG PO TABS Oral Take 1 tablet by mouth every 8 (eight) hours as needed. Pain      BP 113/60  Pulse 58  Temp 97.7 F (36.5 C) (Oral)  Resp 16  SpO2 100%  Physical Exam  Constitutional: She appears well-developed and well-nourished.  HENT:  Head: Normocephalic.  Eyes: Pupils are equal, round, and reactive to light.  Neck: Normal range of motion.  Cardiovascular: Normal rate.   Pulmonary/Chest: Effort normal. No respiratory distress. She has no rales. She exhibits no tenderness.       Nonproductive cough  Abdominal: Soft.  Musculoskeletal: Normal range of motion.  Neurological: She is alert.  Skin: Skin is warm.    ED Course  Procedures (including critical care time)  Labs Reviewed - No data to display No results found.   No diagnosis found.    MDM  Will treat URI with Pseudo and tessalon perlas        Arman Filter, NP 08/26/11 [phone]

## 2011-08-26 NOTE — ED Provider Notes (Signed)
Medical screening examination/treatment/procedure(s) were performed by non-physician practitioner and as supervising physician I was immediately available for consultation/collaboration.    Celene Kras, MD 08/26/11 [phone]

## 2011-10-21 ENCOUNTER — Encounter (HOSPITAL_COMMUNITY): Payer: Self-pay | Admitting: Emergency Medicine

## 2011-10-21 ENCOUNTER — Emergency Department (HOSPITAL_COMMUNITY)
Admission: EM | Admit: 2011-10-21 | Discharge: 2011-10-21 | Disposition: A | Discharge status: Home / Self Care | Payer: Medicare Other | Attending: Emergency Medicine | Admitting: Emergency Medicine

## 2011-10-21 DIAGNOSIS — B373 Candidiasis of vulva and vagina: Secondary | ICD-10-CM

## 2011-10-21 DIAGNOSIS — Z885 Allergy status to narcotic agent status: Secondary | ICD-10-CM | POA: Insufficient documentation

## 2011-10-21 DIAGNOSIS — B3731 Acute candidiasis of vulva and vagina: Secondary | ICD-10-CM

## 2011-10-21 DIAGNOSIS — Z90710 Acquired absence of both cervix and uterus: Secondary | ICD-10-CM | POA: Insufficient documentation

## 2011-10-21 DIAGNOSIS — E079 Disorder of thyroid, unspecified: Secondary | ICD-10-CM | POA: Insufficient documentation

## 2011-10-21 DIAGNOSIS — H109 Unspecified conjunctivitis: Secondary | ICD-10-CM

## 2011-10-21 DIAGNOSIS — Z79899 Other long term (current) drug therapy: Secondary | ICD-10-CM | POA: Insufficient documentation

## 2011-10-21 HISTORY — DX: Disorder of thyroid, unspecified: E07.9

## 2011-10-21 LAB — URINALYSIS, ROUTINE W REFLEX MICROSCOPIC
Bilirubin Urine: NEGATIVE
Glucose, UA: NEGATIVE mg/dL
Ketones, ur: NEGATIVE mg/dL
Leukocytes, UA: NEGATIVE
Nitrite: NEGATIVE
Protein, ur: NEGATIVE mg/dL
Specific Gravity, Urine: 1.023 (ref 1.005–1.030)
Urobilinogen, UA: 1 mg/dL (ref 0.0–1.0)
pH: 6 (ref 5.0–8.0)

## 2011-10-21 LAB — WET PREP, GENITAL
Clue Cells Wet Prep HPF POC: NONE SEEN
Trich, Wet Prep: NONE SEEN
WBC, Wet Prep HPF POC: NONE SEEN
Yeast Wet Prep HPF POC: NONE SEEN

## 2011-10-21 LAB — URINE MICROSCOPIC-ADD ON

## 2011-10-21 LAB — POCT PREGNANCY, URINE: Preg Test, Ur: NEGATIVE

## 2011-10-21 MED ORDER — TOBRAMYCIN 0.3 % OP SOLN
2.0000 [drp] | OPHTHALMIC | Status: DC
  Administered 2011-10-21: 2 [drp] via OPHTHALMIC
  Filled 2011-10-21: qty 5

## 2011-10-21 MED ORDER — FLUORESCEIN SODIUM 1 MG OP STRP
1.0000 | ORAL_STRIP | Freq: Once | OPHTHALMIC | Status: AC
  Administered 2011-10-21: 11:00:00 via OPHTHALMIC
  Filled 2011-10-21: qty 1

## 2011-10-21 MED ORDER — TETRACAINE HCL 0.5 % OP SOLN
1.0000 [drp] | Freq: Once | OPHTHALMIC | Status: AC
  Administered 2011-10-21: 1 [drp] via OPHTHALMIC
  Filled 2011-10-21: qty 2

## 2011-10-21 MED ORDER — FLUCONAZOLE 200 MG PO TABS: 200.0000 mg | ORAL_TABLET | Freq: Every day | ORAL | Status: AC

## 2011-10-21 MED ORDER — FLUCONAZOLE 150 MG PO TABS
150.0000 mg | ORAL_TABLET | Freq: Once | ORAL | Status: AC
  Administered 2011-10-21: 150 mg via ORAL
  Filled 2011-10-21: qty 1

## 2011-10-21 NOTE — ED Notes (Signed)
Patient given bottle of Tobrex eye drops to use at home per MD

## 2011-10-21 NOTE — ED Notes (Signed)
Pt stated that she does not wear glasses and that she gets her vision checked once every year.

## 2011-10-21 NOTE — ED Notes (Signed)
Pt presenting to ed with c/o possible yeast infection symptoms pt states she's having a lot of vaginal itching x 2 weeks pt states she also woke up this morning with redness to her right eye. Pt states she noticed eye drainage this morning. Pt states no sexual partners in 2 years

## 2011-10-21 NOTE — ED Notes (Signed)
MD at bedside. 

## 2011-10-21 NOTE — Discharge Instructions (Signed)
 Conjunctivitis Conjunctivitis is commonly called pink eye. Conjunctivitis can be caused by bacterial or viral infection, allergies, or injuries. There is usually redness of the lining of the eye, itching, discomfort, and sometimes discharge. There may be deposits of matter along the eyelids. A viral infection usually causes a watery discharge, while a bacterial infection causes a yellowish, thick discharge. Pink eye is very contagious and spreads by direct contact. You may be given antibiotic eyedrops as part of your treatment. Before using your eye medicine, remove all drainage from the eye by washing gently with warm water and cotton balls. Continue to use the medication until you have awakened 2 mornings in a row without discharge from the eye. Do not rub your eye. This increases the irritation and helps spread infection. Use separate towels from other household members. Wash your hands with soap and water before and after touching your eyes. Use cold compresses to reduce pain and sunglasses to relieve irritation from light. Do not wear contact lenses or wear eye makeup until the infection is gone. SEEK MEDICAL CARE IF:   Your symptoms are not better after 3 days of treatment.   You have increased pain or trouble seeing.   The outer eyelids become very red or swollen.  Document Released: 02/19/2004 Document Revised: 12/31/2010 Document Reviewed: 01/11/2005 [facility] Patient Information 2012 [place], [place].    Vaginitis Vaginitis is an infection. It causes soreness, swelling, and redness (inflammation) of the vagina. Many of these infections are sexually transmitted diseases (STDs). Having unprotected sex can cause further problems and complications such as:  Chronic pelvic pain.   Infertility.   Unwanted pregnancy.   Abortion.   Tubal pregnancy.   Infection passed on to the newborn.   Cancer.  CAUSES   Monilia. This is a yeast or fungus infection, not an STD.   Bacterial  vaginosis. The normal balance of bacteria in the vagina is disrupted and is replaced by an overgrowth of certain bacteria.   Gonorrhea, chlamydia. These are bacterial infections that are STDs.   Vaginal sponges, diaphragms, and intrauterine devices.   Trichomoniasis. This is a STD infection caused by a parasite.   Viruses like herpes and human papillomavirus. Both are STDs.   Pregnancy.   Immunosuppression. This occurs with certain conditions such as HIV infection or cancer.   Using bubble bath.   Taking certain antibiotic medicines.   Sporadic recurrence can occur if you become sick.   Diabetes.   Steroids.   Allergic reaction. If you have an allergy to:   Douches.   Soaps.   Spermicides.   Condoms.   Scented tampons or vaginal sprays.  SYMPTOMS   Abnormal vaginal discharge.   Itching of the vagina.   Pain in the vagina.   Swelling of the vagina.  In some cases, there are no symptoms. TREATMENT  Treatment will vary depending on the type of infection.  Bacteria or trichomonas are usually treated with oral antibiotics and sometimes vaginal cream or suppositories.   Monilia vaginitis is usually treated with vaginal creams, suppositories, or oral antifungal pills.   Viral vaginitis has no cure. However, the symptoms of herpes (a viral vaginitis) can be treated to relieve the discomfort. Human papillomavirus has no symptoms. However, there are treatments for the diseases caused by human papillomavirus.   With allergic vaginitis, you need to stop using the product that is causing the problem. Vaginal creams can be used to treat the symptoms.   When treating an STD, the sex partner  should also be treated.  HOME CARE INSTRUCTIONS   Take all the medicines as directed by your caregiver.   Do not use scented tampons, soaps, or vaginal sprays.   Do not douche.   Tell your sex partner if you have a vaginal infection or an STD.   Do not have sexual intercourse  until you have treated the vaginitis.   Practice safe sex by using condoms.  SEEK MEDICAL CARE IF:   You have abdominal pain.   Your symptoms get worse during treatment.  Document Released: 11/08/2006 Document Revised: 12/31/2010 Document Reviewed: 07/04/2008 [facility] Patient Information 2012 [place], [place].

## 2011-10-21 NOTE — ED Provider Notes (Signed)
History     CSN: [id number]  Arrival date & time 10/21/11  0865   First MD Initiated Contact with Patient 10/21/11 0957      Chief Complaint  Patient presents with  . Vaginitis  . Eye Pain    (Consider location/radiation/quality/duration/timing/severity/associated sxs/prior treatment) HPI Comments: Pt reports h/o recurrent BV and yeast infections.  Her usual family practice physician had left, she used to have running refills of Cipro, flagyl and diflucan that she would take at home when she developed symptoms.  She no longer has any.  White, thick non odorous discharge for about 2 weeks with itching.  No abd pain, back pain, pelvic pain.  Is s/p complete hysterectomy.  No fevers, chills.  No ulcers.  She also woke with redness to right eye, no change in vision, no eye pain, no fevers, no recent cold symptoms.  She is concerned because has a 3 year old child at home that she doesn't want to spread anything to.  Pt has allergies, although not too bad currently.  No photophobia, change in vision, HA.    The history is provided by the patient.    Past Medical History  Diagnosis Date  . Thyroid disease     Past Surgical History  Procedure Date  . Back surgery     spinal fusion L4-L5-S1  . Abdominal hysterectomy     History reviewed. No pertinent family history.  History  Substance Use Topics  . Smoking status: Never Smoker   . Smokeless tobacco: Not on file  . Alcohol Use: No    OB History    Grav Para Term Preterm Abortions TAB SAB Ect Mult Living                  Review of Systems  HENT: Negative for congestion, rhinorrhea and postnasal drip.   Eyes: Positive for redness. Negative for photophobia, pain, discharge and visual disturbance.  Respiratory: Negative for cough.   Gastrointestinal: Negative for nausea and vomiting.  Genitourinary: Positive for vaginal discharge. Negative for dysuria, frequency, genital sores and vaginal pain.  Skin: Negative for rash.  All  other systems reviewed and are negative.    Allergies  Percocet  Home Medications   Current Outpatient Rx  Name Route Sig Dispense Refill  . ACETAZOLAMIDE 250 MG PO TABS Oral Take 250 mg by mouth 3 (three) times daily.    Marland Kitchen BACLOFEN 10 MG PO TABS Oral Take 10 mg by mouth 3 (three) times daily.    Marland Kitchen FEXOFENADINE-PSEUDOEPHED ER 60-120 MG PO TB12 Oral Take 1 tablet by mouth every 12 (twelve) hours. 30 tablet 0  . GABAPENTIN 300 MG PO CAPS Oral Take 300 mg by mouth at bedtime.    Marland Kitchen LEVOTHYROXINE SODIUM 25 MCG PO TABS Oral Take 25 mcg by mouth daily.    . OXYCODONE-ACETAMINOPHEN 10-325 MG PO TABS Oral Take 1 tablet by mouth every 8 (eight) hours as needed. Pain    . ESTRADIOL 1 MG PO TABS Oral Take 1.5 mg by mouth daily.    Marland Kitchen FLUCONAZOLE 200 MG PO TABS Oral Take 1 tablet (200 mg total) by mouth daily. 3 tablet 1    BP 116/69  Pulse 104  Temp 97.4 F (36.3 C) (Oral)  Resp 20  SpO2 100%  Physical Exam  Nursing note and vitals reviewed. Constitutional: She is oriented to person, place, and time. She appears well-developed and well-nourished.  HENT:  Head: Normocephalic and atraumatic.  Eyes: EOM are normal. Pupils  are equal, round, and reactive to light. Right eye exhibits no chemosis and no discharge. Right conjunctiva is injected. Right conjunctiva has no hemorrhage.  Pulmonary/Chest: Effort normal. No respiratory distress.  Abdominal: Soft. There is no tenderness. There is no rebound and no guarding.  Genitourinary: There is no rash on the right labia. There is no rash on the left labia. Uterus is not tender. No erythema, tenderness or bleeding around the vagina. Vaginal discharge found.       Chaperone present  Neurological: She is alert and oriented to person, place, and time.  Skin: Skin is warm and dry. No rash noted.    ED Course  Procedures (including critical care time)  Labs Reviewed  URINALYSIS, ROUTINE W REFLEX MICROSCOPIC - Abnormal; Notable for the following:     APPearance CLOUDY (*)     Hgb urine dipstick SMALL (*)     All other components within normal limits  URINE MICROSCOPIC-ADD ON - Abnormal; Notable for the following:    Squamous Epithelial / LPF FEW (*)     Bacteria, UA FEW (*)     All other components within normal limits  WET PREP, GENITAL  GC/CHLAMYDIA PROBE AMP, GENITAL   No results found.   1. Conjunctivitis of right eye   2. Yeast vaginitis     11:56 AM Wet prep is neg, however sig discharge is present that is thick, white, curdy.  Will  Presume yeast and treat accordingly.    MDM  Pt with likely yeast vaginitis.  Conjuntivitis.  Will start on abx drops, give diflucan.  GC/Chl sent, however pt reports no recent sexual contacts recently.  No fever, abd pain or tenderness.  Also pt is s/p hysterectomy so unlikely PID.          Gavin Pound. Vassie Kugel, MD 10/21/11 1157

## 2011-10-21 NOTE — ED Notes (Signed)
Patient reading at bedside.

## 2011-10-22 LAB — GC/CHLAMYDIA PROBE AMP, GENITAL
Chlamydia, DNA Probe: NEGATIVE
GC Probe Amp, Genital: NEGATIVE

## 2011-10-31 ENCOUNTER — Encounter (HOSPITAL_COMMUNITY): Payer: Self-pay | Admitting: Emergency Medicine

## 2011-10-31 ENCOUNTER — Emergency Department (HOSPITAL_COMMUNITY)
Admission: EM | Admit: 2011-10-31 | Discharge: 2011-10-31 | Disposition: A | Discharge status: Home / Self Care | Payer: Medicare Other | Attending: Emergency Medicine | Admitting: Emergency Medicine

## 2011-10-31 DIAGNOSIS — L03119 Cellulitis of unspecified part of limb: Secondary | ICD-10-CM

## 2011-10-31 DIAGNOSIS — E079 Disorder of thyroid, unspecified: Secondary | ICD-10-CM | POA: Insufficient documentation

## 2011-10-31 DIAGNOSIS — L02419 Cutaneous abscess of limb, unspecified: Secondary | ICD-10-CM | POA: Insufficient documentation

## 2011-10-31 MED ORDER — FLUCONAZOLE 150 MG PO TABS: 150.0000 mg | ORAL_TABLET | Freq: Once | ORAL | Status: DC

## 2011-10-31 MED ORDER — HYDROCORTISONE 1 % EX CREA: TOPICAL_CREAM | CUTANEOUS | Status: DC

## 2011-10-31 MED ORDER — CEPHALEXIN 500 MG PO CAPS: 500.0000 mg | ORAL_CAPSULE | Freq: Four times a day (QID) | ORAL | Status: DC

## 2011-10-31 NOTE — ED Notes (Signed)
Pt reports two insect bites on inner thighs. Area slightly swollen, red and itching. Drainage from "pimple" was cloudy

## 2011-10-31 NOTE — ED Provider Notes (Signed)
History     CSN: [id number]  Arrival date & time 10/31/11  1253   First MD Initiated Contact with Patient 10/31/11 1341      Chief Complaint  Patient presents with  . Insect Bite    C/o 3 days hx of stinging sensation and swelling at two .5cm sites on thighs. Areas slightly red    (Consider location/radiation/quality/duration/timing/severity/associated sxs/prior treatment) HPI Comments: Alexandria Hobbs is a 41 y.o. Female who presents with two lesions to the bilateral thighs. States 3 days ago, she had pants on and felt something bite her on the right thigh. States few minutes later felt another bite on left thigh. States areas were red, swollen, with central pimple. Pt states pimples "busted" on their own and now there is redness around them that is spreading. Denies fever, chills, malaise. States putting warm compresses with no improvement. States area is tender and itchy.    Past Medical History  Diagnosis Date  . Thyroid disease     Past Surgical History  Procedure Date  . Back surgery     spinal fusion L4-L5-S1  . Abdominal hysterectomy   . Breast lumpectomy     Family History  Problem Relation Age of Onset  . Hypertension Mother   . Heart failure Father   . Cancer Sister   . Hypertension Sister   . Heart failure Other     History  Substance Use Topics  . Smoking status: Never Smoker   . Smokeless tobacco: Not on file  . Alcohol Use: No    OB History    Grav Para Term Preterm Abortions TAB SAB Ect Mult Living                  Review of Systems  Constitutional: Negative for fever and chills.  HENT: Negative for facial swelling.   Respiratory: Negative.   Cardiovascular: Negative.   Skin: Positive for wound.  Neurological: Negative for dizziness, numbness and headaches.    Allergies  Lyrica; Morphine and related; and Percocet  Home Medications   Current Outpatient Rx  Name Route Sig Dispense Refill  . ACETAZOLAMIDE 250 MG PO TABS Oral Take  250 mg by mouth 3 (three) times daily.    Marland Kitchen BACLOFEN 10 MG PO TABS Oral Take 10 mg by mouth 3 (three) times daily.    Marland Kitchen DICLOFENAC SODIUM 1.5 % TD SOLN Transdermal Place 40 drops onto the skin 3 (three) times daily as needed. For pain.    Marland Kitchen ESTRADIOL 1 MG PO TABS Oral Take 1.5 mg by mouth daily.    Marland Kitchen FEXOFENADINE-PSEUDOEPHED ER 60-120 MG PO TB12 Oral Take 1 tablet by mouth every 12 (twelve) hours. For allergies.    Marland Kitchen GABAPENTIN 800 MG PO TABS Oral Take 3,200 mg by mouth at bedtime.    Marland Kitchen LEVOTHYROXINE SODIUM 25 MCG PO TABS Oral Take 25 mcg by mouth daily.    . OXYCODONE-ACETAMINOPHEN 10-325 MG PO TABS Oral Take 1 tablet by mouth every 8 (eight) hours as needed. Pain    . SUMATRIPTAN SUCCINATE 100 MG PO TABS Oral Take 100 mg by mouth every 2 (two) hours as needed. For migraines.      BP 94/57  Pulse 57  Temp 98.4 F (36.9 C) (Oral)  Resp 20  SpO2 100%  Physical Exam  Nursing note and vitals reviewed. Constitutional: She appears well-developed and well-nourished. No distress.  HENT:  Head: Normocephalic and atraumatic.  Eyes: Conjunctivae normal are normal.  Cardiovascular: Normal rate,  regular rhythm and normal heart sounds.   Pulmonary/Chest: Effort normal and breath sounds normal. No respiratory distress. She has no wheezes. She has no rales.  Neurological: She is alert.  Skin:       3cm area of erythema to the mid left medial thigh, warm to the touch, no induration of fluctuance. 4cm area of erythema to the left medial upper thigh, tender, small area of induration  Psychiatric: She has a normal mood and affect.    ED Course  Procedures (including critical care time)  Pt with two areas of possible cellulitis vs localized reaction to insect sting?   Pt afebrile, non toxic. Will treat wth antibiotic to cover for possible infection, topical steroid cream. Follow up closely.   1. Cellulitis of thigh       MDM          Lottie Mussel, PA 10/31/11 1423

## 2011-11-03 NOTE — ED Provider Notes (Signed)
Medical screening examination/treatment/procedure(s) were performed by non-physician practitioner and as supervising physician I was immediately available for consultation/collaboration.  Lauris Serviss L Delance Weide, MD 11/03/11 0741 

## 2012-01-06 ENCOUNTER — Ambulatory Visit (INDEPENDENT_AMBULATORY_CARE_PROVIDER_SITE_OTHER): Payer: Medicare Other | Admitting: Physician Assistant

## 2012-01-06 VITALS — BP 108/72 | HR 55 | Temp 97.9°F | Resp 16 | Ht 61.5 in | Wt 133.2 lb

## 2012-01-06 DIAGNOSIS — L678 Other hair color and hair shaft abnormalities: Secondary | ICD-10-CM

## 2012-01-06 DIAGNOSIS — L739 Follicular disorder, unspecified: Secondary | ICD-10-CM

## 2012-01-06 DIAGNOSIS — L738 Other specified follicular disorders: Secondary | ICD-10-CM

## 2012-01-06 MED ORDER — DOXYCYCLINE HYCLATE 100 MG PO CAPS: 100.0000 mg | ORAL_CAPSULE | Freq: Two times a day (BID) | ORAL | Status: DC

## 2012-01-06 NOTE — Patient Instructions (Signed)
Folliculitis   Folliculitis is redness, soreness, and swelling (inflammation) of the hair follicles. This condition can occur anywhere on the body. People with weakened immune systems, diabetes, or obesity have a greater risk of getting folliculitis.  CAUSES   Bacterial infection. This is the most common cause.   Fungal infection.   Viral infection.   Contact with certain chemicals, especially oils and tars.  Long-term folliculitis can result from bacteria that live in the nostrils. The bacteria may trigger multiple outbreaks of folliculitis over time.  SYMPTOMS  Folliculitis most commonly occurs on the scalp, thighs, legs, back, buttocks, and areas where hair is shaved frequently. An early sign of folliculitis is a small, white or yellow, pus-filled, itchy lesion (pustule). These lesions appear on a red, inflamed follicle. They are usually less than 0.2 inches (5 mm) wide. When there is an infection of the follicle that goes deeper, it becomes a boil or furuncle. A group of closely packed boils creates a larger lesion (carbuncle). Carbuncles tend to occur in hairy, sweaty areas of the body.  DIAGNOSIS   Your caregiver can usually tell what is wrong by doing a physical exam. A sample may be taken from one of the lesions and tested in a lab. This can help determine what is causing your folliculitis.  TREATMENT   Treatment may include:   Applying warm compresses to the affected areas.   Taking antibiotic medicines orally or applying them to the skin.   Draining the lesions if they contain a large amount of pus or fluid.   Laser hair removal for cases of long-lasting folliculitis. This helps to prevent regrowth of the hair.  HOME CARE INSTRUCTIONS   Apply warm compresses to the affected areas as directed by your caregiver.   If antibiotics are prescribed, take them as directed. Finish them even if you start to feel better.   You may take over-the-counter medicines to relieve itching.   Do not shave  irritated skin.   Follow up with your caregiver as directed.  SEEK IMMEDIATE MEDICAL CARE IF:    You have increasing redness, swelling, or pain in the affected area.   You have a fever.  MAKE SURE YOU:   Understand these instructions.   Will watch your condition.   Will get help right away if you are not doing well or get worse.  Document Released: 03/22/2001 Document Revised: 07/13/2011 Document Reviewed: 04/13/2011  [employer] Patient Information 2013 [employer].

## 2012-01-06 NOTE — Progress Notes (Signed)
  Subjective:    Patient ID: Alexandria Hobbs, female    DOB: 04-14-1970, 41 y.o.   MRN: [id number]  HPI 41 year old female presents with about 1 week history of "hair bumps" on right axillae.  States these arose after using Darene Lamer.  Over the past week the bumps have grown in size, drained, and then reformed.  She says they "come up" again each time after she uses deodorant.  Denies fever, chills, nausea, vomiting, or abdominal pain. States her bra was irritating this area today which is why it is more tender today. No drainage in about 4 days.  Denies warmth or fluctuance.     Review of Systems  Constitutional: Negative for fever and chills.  Genitourinary: Negative for genital sores and vaginal pain.  Skin: Positive for rash and wound.  Neurological: Negative for headaches.  All other systems reviewed and are negative.       Objective:   Physical Exam  Constitutional: She is oriented to person, place, and time. She appears well-developed and well-nourished.  HENT:  Head: Normocephalic and atraumatic.  Right Ear: External ear normal.  Left Ear: External ear normal.  Eyes: Conjunctivae normal are normal.  Neck: Normal range of motion.  Cardiovascular: Normal rate.   Pulmonary/Chest: Effort normal.  Neurological: She is alert and oriented to person, place, and time.  Skin:       1 cm indurated nodule under right axilla with erythema. No warmth, fluctuance, or drainage.   Psychiatric: She has a normal mood and affect. Her behavior is normal. Judgment and thought content normal.          Assessment & Plan:   1. Folliculitis  doxycycline (VIBRAMYCIN) 100 MG capsule, DISCONTINUED: doxycycline (VIBRAMYCIN) 100 MG capsule   Doxycycline bid x 10 days Warm compresses Keep clean and dry for at least 48 hours If no improvement after completion of antibiotic, recommend recheck. If area develops fluctuance or starts draining, return sooner for possible I&D.

## 2012-01-09 ENCOUNTER — Telehealth: Payer: Self-pay

## 2012-01-09 NOTE — Telephone Encounter (Signed)
PT IS THROWING UP AND HAS A YEAST INFECTION.    THINKS IT IS THE ANTIBIOTICS   [facility] ON [address]   BEST NUMBER IS [id number]

## 2012-01-10 ENCOUNTER — Telehealth: Payer: Self-pay | Admitting: Radiology

## 2012-01-10 MED ORDER — FLUCONAZOLE 150 MG PO TABS: 150.0000 mg | ORAL_TABLET | Freq: Once | ORAL | Status: DC

## 2012-01-10 NOTE — Telephone Encounter (Signed)
Alexandria Hobbs I will forward this to you.

## 2012-01-10 NOTE — Telephone Encounter (Signed)
Has she been using warm compresses to the area? Has it drained anything? She was advised that if it started to drain or become "softer" she may need to return for I&D. If it is not improving this is what she may have to do.

## 2012-01-10 NOTE — Telephone Encounter (Signed)
Called pt advised we can send in Diflucan,for the yeast infection. She states folliculitis no better. She is advised to discontinue the antibiotic (doxy) and I will call her back with further instructions.

## 2012-01-10 NOTE — Telephone Encounter (Signed)
error 

## 2012-01-10 NOTE — Telephone Encounter (Signed)
Called patient to advise. She will use warm compresses and return if worse.

## 2013-01-21 ENCOUNTER — Encounter (HOSPITAL_COMMUNITY): Payer: Self-pay | Admitting: Emergency Medicine

## 2013-01-21 ENCOUNTER — Emergency Department (HOSPITAL_COMMUNITY)
Admission: EM | Admit: 2013-01-21 | Discharge: 2013-01-21 | Disposition: A | Discharge status: Home / Self Care | Payer: Medicare Other | Attending: Emergency Medicine | Admitting: Emergency Medicine

## 2013-01-21 DIAGNOSIS — R509 Fever, unspecified: Secondary | ICD-10-CM | POA: Insufficient documentation

## 2013-01-21 DIAGNOSIS — R51 Headache: Secondary | ICD-10-CM | POA: Insufficient documentation

## 2013-01-21 DIAGNOSIS — Z79899 Other long term (current) drug therapy: Secondary | ICD-10-CM | POA: Insufficient documentation

## 2013-01-21 DIAGNOSIS — R109 Unspecified abdominal pain: Secondary | ICD-10-CM | POA: Insufficient documentation

## 2013-01-21 DIAGNOSIS — J029 Acute pharyngitis, unspecified: Secondary | ICD-10-CM | POA: Insufficient documentation

## 2013-01-21 DIAGNOSIS — Z8709 Personal history of other diseases of the respiratory system: Secondary | ICD-10-CM | POA: Insufficient documentation

## 2013-01-21 DIAGNOSIS — J3489 Other specified disorders of nose and nasal sinuses: Secondary | ICD-10-CM | POA: Insufficient documentation

## 2013-01-21 DIAGNOSIS — J069 Acute upper respiratory infection, unspecified: Secondary | ICD-10-CM

## 2013-01-21 DIAGNOSIS — E079 Disorder of thyroid, unspecified: Secondary | ICD-10-CM | POA: Insufficient documentation

## 2013-01-21 MED ORDER — NAPROXEN 500 MG PO TABS: 500.0000 mg | ORAL_TABLET | Freq: Two times a day (BID) | ORAL | Status: DC

## 2013-01-21 MED ORDER — OXYMETAZOLINE HCL 0.05 % NA SOLN: 1.0000 | Freq: Two times a day (BID) | NASAL | Status: AC

## 2013-01-21 MED ORDER — BENZONATATE 100 MG PO CAPS: 100.0000 mg | ORAL_CAPSULE | Freq: Three times a day (TID) | ORAL | Status: DC

## 2013-01-21 MED ORDER — KETOROLAC TROMETHAMINE 60 MG/2ML IM SOLN
60.0000 mg | Freq: Once | INTRAMUSCULAR | Status: AC
Start: 2013-01-21 — End: 2013-01-21
  Administered 2013-01-21: 60 mg via INTRAMUSCULAR
  Filled 2013-01-21: qty 2

## 2013-01-21 NOTE — ED Notes (Signed)
Pt has had toradol before OK to be discharged

## 2013-01-21 NOTE — ED Provider Notes (Signed)
CSN: [id number]     Arrival date & time 01/21/13  1557 History  This chart was scribed for non-physician practitioner Renne Crigler, PA-C, working with Hurman Horn, MD by Dorothey Baseman, ED Scribe. This patient was seen in room WTR5/WTR5 and the patient's care was started at 6:15 PM.    Chief Complaint  Patient presents with  . Cough  . Sore Throat   The history is provided by the patient. No language interpreter was used.   HPI Comments: Alexandria Hobbs is a 42 y.o. female with a history of bronchitis who presents to the Emergency Department complaining of a constant, productive cough with green/yellow sputum with associated sore throat, sinus pressure/congestion, diffuse headache, and diffuse abdominal pain secondary to the cough onset 3 days ago. Patient also reports an associated subjective fever (patient is afebrile in the ED). She reports taking OTC cold medication, Nyquil, and Dayquil at home without significant relief. She denies myalgias. She denies history of asthma. Patient has allergies to Percocet and morphine. Patient has a history of thyroid disease.    Past Medical History  Diagnosis Date  . Thyroid disease    Past Surgical History  Procedure Laterality Date  . Back surgery      spinal fusion L4-L5-S1  . Abdominal hysterectomy    . Breast lumpectomy     Family History  Problem Relation Age of Onset  . Hypertension Mother   . Heart failure Father   . Cancer Sister   . Hypertension Sister   . Heart failure Other    History  Substance Use Topics  . Smoking status: Never Smoker   . Smokeless tobacco: Not on file  . Alcohol Use: No   OB History   Grav Para Term Preterm Abortions TAB SAB Ect Mult Living                 Review of Systems  Constitutional: Positive for fever (subjective).  HENT: Positive for congestion, sinus pressure and sore throat.   Respiratory: Positive for cough.   Gastrointestinal: Positive for abdominal pain.  Musculoskeletal: Negative  for myalgias.  Neurological: Positive for headaches.  All other systems reviewed and are negative.    Allergies  Lyrica; Morphine and related; and Percocet  Home Medications   Current Outpatient Rx  Name  Route  Sig  Dispense  Refill  . acetaZOLAMIDE (DIAMOX) 250 MG tablet   Oral   Take 250 mg by mouth 3 (three) times daily.         . baclofen (LIORESAL) 10 MG tablet   Oral   Take 10 mg by mouth 3 (three) times daily.         . cephALEXin (KEFLEX) 500 MG capsule   Oral   Take 1 capsule (500 mg total) by mouth 4 (four) times daily.   28 capsule   0   . Diclofenac Sodium (PENNSAID) 1.5 % SOLN   Transdermal   Place 40 drops onto the skin 3 (three) times daily as needed. For pain.         Marland Kitchen doxycycline (VIBRAMYCIN) 100 MG capsule   Oral   Take 1 capsule (100 mg total) by mouth 2 (two) times daily.   20 capsule   0   . estradiol (ESTRACE) 1 MG tablet   Oral   Take 1.5 mg by mouth daily.         . fluconazole (DIFLUCAN) 150 MG tablet   Oral   Take 1 tablet (150  mg total) by mouth once.   1 tablet   0   . fluconazole (DIFLUCAN) 150 MG tablet   Oral   Take 1 tablet (150 mg total) by mouth once. Repeat if needed   2 tablet   0   . gabapentin (NEURONTIN) 800 MG tablet   Oral   Take 3,200 mg by mouth at bedtime.         . hydrocortisone cream 1 %      Apply to affected area 2 times daily   15 g   0   . levothyroxine (SYNTHROID, LEVOTHROID) 25 MCG tablet   Oral   Take 25 mcg by mouth daily.         Marland Kitchen oxyCODONE-acetaminophen (PERCOCET) 10-325 MG per tablet   Oral   Take 1 tablet by mouth every 8 (eight) hours as needed. Pain         . SUMAtriptan (IMITREX) 100 MG tablet   Oral   Take 100 mg by mouth every 2 (two) hours as needed. For migraines.          Triage Vitals: BP 112/59  Pulse 64  Temp(Src) 97.9 F (36.6 C) (Oral)  Resp 14  SpO2 100%  Physical Exam  Nursing note and vitals reviewed. Constitutional: She appears  well-developed and well-nourished. No distress.  HENT:  Head: Normocephalic and atraumatic.  Right Ear: Hearing, tympanic membrane, external ear and ear canal normal.  Left Ear: Hearing, tympanic membrane, external ear and ear canal normal.  Nose: Nose normal. No mucosal edema or rhinorrhea.  Mouth/Throat: Uvula is midline, oropharynx is clear and moist and mucous membranes are normal. Mucous membranes are not dry. No oral lesions. No trismus in the jaw. No uvula swelling. No oropharyngeal exudate, posterior oropharyngeal edema, posterior oropharyngeal erythema or tonsillar abscesses.  Rhinorrhea and nasal mucosa edema.  Eyes: Conjunctivae are normal. Right eye exhibits no discharge. Left eye exhibits no discharge.  Neck: Normal range of motion. Neck supple.  Cardiovascular: Normal rate, regular rhythm and normal heart sounds.   Pulmonary/Chest: Effort normal and breath sounds normal. No respiratory distress. She has no wheezes. She has no rales.  Abdominal: Soft. She exhibits no distension. There is no tenderness.  Musculoskeletal: Normal range of motion.  Lymphadenopathy:    She has no cervical adenopathy.  Neurological: She is alert.  Skin: Skin is warm and dry.  Psychiatric: She has a normal mood and affect. Her behavior is normal.    ED Course  Procedures (including critical care time)  DIAGNOSTIC STUDIES: Oxygen Saturation is 100% on room air, normal by my interpretation.    COORDINATION OF CARE: 6:19 PM- Discussed that symptoms are likely due to a viral URI and that antibiotics will not be necessary at this time. Will order medication to manage symptoms. Will discharge patient with a cough suppressant. Advised patient to use nasal spray, such as Afrin, at home. Discussed treatment plan with patient at bedside and patient verbalized agreement.   Labs Review Labs Reviewed - No data to display Imaging Review No results found.  EKG Interpretation   None      Patient urged  to return with worsening symptoms or other concerns. Patient verbalized understanding and agrees with plan.    MDM   1. Upper respiratory tract infection    Patient with symptoms consistent with a viral syndrome.  Vitals are stable, no fever.  No signs of dehydration.  Lung exam normal, no signs of pneumonia.  Supportive therapy indicated with  return if symptoms worsen.     I personally performed the services described in this documentation, which was scribed in my presence. The recorded information has been reviewed and is accurate.     Renne Crigler, PA-C 01/21/13 2037

## 2013-01-21 NOTE — ED Notes (Signed)
Pt c/o cough and sore throat since christmas.

## 2013-01-22 NOTE — ED Provider Notes (Signed)
Medical screening examination/treatment/procedure(s) were performed by non-physician practitioner and as supervising physician I was immediately available for consultation/collaboration.  Darrian Goodwill M Nautica Hotz, MD 01/22/13 1627 

## 2013-04-21 ENCOUNTER — Encounter (HOSPITAL_COMMUNITY): Payer: Self-pay | Admitting: Emergency Medicine

## 2013-04-21 ENCOUNTER — Emergency Department (HOSPITAL_COMMUNITY): Payer: Medicare Other

## 2013-04-21 ENCOUNTER — Emergency Department (HOSPITAL_COMMUNITY)
Admission: EM | Admit: 2013-04-21 | Discharge: 2013-04-21 | Disposition: A | Discharge status: Home / Self Care | Payer: Medicare Other | Attending: Emergency Medicine | Admitting: Emergency Medicine

## 2013-04-21 DIAGNOSIS — R109 Unspecified abdominal pain: Secondary | ICD-10-CM | POA: Insufficient documentation

## 2013-04-21 DIAGNOSIS — E079 Disorder of thyroid, unspecified: Secondary | ICD-10-CM | POA: Insufficient documentation

## 2013-04-21 DIAGNOSIS — R509 Fever, unspecified: Secondary | ICD-10-CM | POA: Insufficient documentation

## 2013-04-21 DIAGNOSIS — R05 Cough: Secondary | ICD-10-CM | POA: Insufficient documentation

## 2013-04-21 DIAGNOSIS — J040 Acute laryngitis: Secondary | ICD-10-CM | POA: Insufficient documentation

## 2013-04-21 DIAGNOSIS — J3489 Other specified disorders of nose and nasal sinuses: Secondary | ICD-10-CM | POA: Insufficient documentation

## 2013-04-21 DIAGNOSIS — Z79899 Other long term (current) drug therapy: Secondary | ICD-10-CM | POA: Insufficient documentation

## 2013-04-21 DIAGNOSIS — R059 Cough, unspecified: Secondary | ICD-10-CM | POA: Insufficient documentation

## 2013-04-21 DIAGNOSIS — J22 Unspecified acute lower respiratory infection: Secondary | ICD-10-CM

## 2013-04-21 DIAGNOSIS — Z791 Long term (current) use of non-steroidal anti-inflammatories (NSAID): Secondary | ICD-10-CM | POA: Insufficient documentation

## 2013-04-21 MED ORDER — HYDROCODONE-HOMATROPINE 5-1.5 MG/5ML PO SYRP: 5.0000 mL | ORAL_SOLUTION | Freq: Four times a day (QID) | ORAL | Status: DC | PRN

## 2013-04-21 MED ORDER — PREDNISONE 20 MG PO TABS: 40.0000 mg | ORAL_TABLET | Freq: Every day | ORAL | Status: DC

## 2013-04-21 NOTE — ED Notes (Signed)
Pt states she has had cough and nasal congestion x1 week.  Pt did not measure her temperature at home, but endorses chills.  Pt denies N/V/D.  Pt states cough is not productive.  Pt's lung sounds are clear.  Pt c/o throat ache, and chest and abdominal pain because she has been coughing so harshly.

## 2013-04-21 NOTE — ED Provider Notes (Signed)
Medical screening examination/treatment/procedure(s) were performed by non-physician practitioner and as supervising physician I was immediately available for consultation/collaboration.   EKG Interpretation None       Doug SouSam Martinez Boxx, MD 04/21/13 1344

## 2013-04-21 NOTE — ED Provider Notes (Signed)
CSN: [id number]     Arrival date & time 04/21/13  1140 History    Chief Complaint  Patient presents with  . Cough  . Nasal Congestion  . Sore Throat   This chart was scribed for non-physician practitioner, Arthor CaptainAbigail Khylan Sawyer, PA-C working with Doug SouSam Jacubowitz, MD, by Andrew Auaven Small, ED Scribe. This patient was seen in room WTR7/WTR7  and the patient's care was started at 12:03 PM.  Patient is a 43 y.o. female presenting with cough and pharyngitis.  Cough Associated symptoms: fever and sore throat   Associated symptoms: no chills   Sore Throat Associated symptoms include abdominal pain.    HPI Comments:  Alexandria Hobbs is a 43 y.o. female who presents to the Emergency Department complaining of worsening cough onset 1 week ago. Pt reports that when she cough she has burning and tightness in her chest. Pt reports that 2 days ago she became hoarse. Pt reports that she has taken Theraflu with minor relief to symptoms . She reports associated subjective fever, abdominal pain when coughing, congestion and sore throat. She denies dysuria, chills, body aches and weakness. Pt reports that her son has recently had an irritated throat. Pt reports that she has chronic back pain. She denies any other medical illnesses.   Past Medical History  Diagnosis Date  . Thyroid disease    Past Surgical History  Procedure Laterality Date  . Back surgery      spinal fusion L4-L5-S1  . Abdominal hysterectomy    . Breast lumpectomy     Family History  Problem Relation Age of Onset  . Hypertension Mother   . Heart failure Father   . Cancer Sister   . Hypertension Sister   . Heart failure Other    History  Substance Use Topics  . Smoking status: Never Smoker   . Smokeless tobacco: Never Used  . Alcohol Use: No   OB History   Grav Para Term Preterm Abortions TAB SAB Ect Mult Living                 Review of Systems  Constitutional: Positive for fever. Negative for chills.  HENT: Positive for  congestion and sore throat.   Respiratory: Positive for cough and chest tightness.   Gastrointestinal: Positive for abdominal pain. Negative for nausea and vomiting.  Genitourinary: Negative for dysuria.  Neurological: Negative for weakness.   Allergies  Lyrica; Morphine and related; and Percocet  Home Medications   Current Outpatient Rx  Name  Route  Sig  Dispense  Refill  . acetaZOLAMIDE (DIAMOX) 250 MG tablet   Oral   Take 250 mg by mouth 3 (three) times daily.         . baclofen (LIORESAL) 10 MG tablet   Oral   Take 10 mg by mouth 3 (three) times daily.         . butalbital-acetaminophen-caffeine (FIORICET, ESGIC) 50-325-40 MG per tablet   Oral   Take 1 tablet by mouth 2 (two) times daily as needed for headache or migraine.         Marland Kitchen. estradiol (ESTRACE) 1 MG tablet   Oral   Take 1.5 mg by mouth daily.         Marland Kitchen. gabapentin (NEURONTIN) 800 MG tablet   Oral   Take 3,200 mg by mouth at bedtime.         Marland Kitchen. ketorolac (TORADOL) 10 MG tablet   Oral   Take 10 mg by mouth every 6 (six)  hours as needed for moderate pain (migraine).         Marland Kitchen levothyroxine (SYNTHROID, LEVOTHROID) 25 MCG tablet   Oral   Take 25 mcg by mouth daily.         . naproxen (NAPROSYN) 500 MG tablet   Oral   Take 1 tablet (500 mg total) by mouth 2 (two) times daily.   20 tablet   0   . oxyCODONE-acetaminophen (PERCOCET) 10-325 MG per tablet   Oral   Take 1 tablet by mouth every 8 (eight) hours as needed for pain.          Marland Kitchen oxymetazoline (AFRIN NASAL SPRAY) 0.05 % nasal spray   Each Nare   Place 1 spray into both nostrils 2 (two) times daily.   30 mL   0   . SUMAtriptan (IMITREX) 100 MG tablet   Oral   Take 100 mg by mouth every 2 (two) hours as needed for migraine.           BP 106/64  Pulse 63  Temp(Src) 97.5 F (36.4 C) (Oral)  Resp 17  SpO2 100% Physical Exam  Nursing note and vitals reviewed. Constitutional: She is oriented to person, place, and time. She  appears well-developed and well-nourished. No distress.  HENT:  Head: Normocephalic and atraumatic.  Mouth/Throat: No posterior oropharyngeal erythema.  Eyes: EOM are normal.  Neck: Neck supple.  No pre or post lymphadenopathy   Cardiovascular: Normal rate and regular rhythm.   No murmur heard. Pulmonary/Chest: Effort normal and breath sounds normal. No respiratory distress.  Musculoskeletal: Normal range of motion.  Lymphadenopathy:    She has cervical adenopathy ( minor).  Neurological: She is alert and oriented to person, place, and time.  Skin: Skin is warm and dry.  Psychiatric: She has a normal mood and affect. Her behavior is normal.   ED Course  Procedures DIAGNOSTIC STUDIES: Oxygen Saturation is 100% on RA, normal by my interpretation.    COORDINATION OF CARE: 12:31 PM-Discussed treatment plan which includes and pt agreed to plan.   Labs Review Labs Reviewed - No data to display Imaging Review No results found.   EKG Interpretation None     MDM   Final diagnoses:  Chest cold  Laryngitis    Pt CXR negative for acute infiltrate. Patients symptoms are consistent with URI, likely viral etiology. Discussed that antibiotics are not indicated for viral infections. Pt will be discharged with symptomatic treatment.  Verbalizes understanding and is agreeable with plan. Pt is hemodynamically stable & in NAD prior to dc.  I personally performed the services described in this documentation, which was scribed in my presence. The recorded information has been reviewed and is accurate.      Arthor Captain, PA-C 04/21/13 1306

## 2013-04-21 NOTE — Discharge Instructions (Signed)
Read the instructions below on reasons to return to the emergency department and to learn more about your diagnosis.  Use over the counter medications for symptomatic relief as we discussed (musinex as a decongestant, Tylenol for fever/pain, Motrin/Ibuprofen for muscle aches). If prescribed a cough suppressant during your visit, do not operate heavy machinery with in 5 hours of taking this medication. Followup with your primary care doctor in 4 days if your symptoms persist.  Your more than welcome to return to the emergency department if symptoms worsen or become concerning.  Upper Respiratory Infection, Adult  An upper respiratory infection (URI) is also sometimes known as the common cold. Most people improve within 1 week, but symptoms can last up to 2 weeks. A residual cough may last even longer.   URI is most commonly caused by a virus. Viruses are NOT treated with antibiotics. You can easily spread the virus to others by oral contact. This includes kissing, sharing a glass, coughing, or sneezing. Touching your mouth or nose and then touching a surface, which is then touched by another person, can also spread the virus.   TREATMENT  Treatment is directed at relieving symptoms. There is no cure. Antibiotics are not effective, because the infection is caused by a virus, not by bacteria. Treatment may include:  Increased fluid intake. Sports drinks offer valuable electrolytes, sugars, and fluids.  Breathing heated mist or steam (vaporizer or shower).  Eating chicken soup or other clear broths, and maintaining good nutrition.  Getting plenty of rest.  Using gargles or lozenges for comfort.  Controlling fevers with ibuprofen or acetaminophen as directed by your caregiver.  Increasing usage of your inhaler if you have asthma.  Return to work when your temperature has returned to normal.   SEEK MEDICAL CARE IF:  After the first few days, you feel you are getting worse rather than better.  You  develop worsening shortness of breath, or brown or red sputum. These may be signs of pneumonia.  You develop yellow or brown nasal discharge or pain in the face, especially when you bend forward. These may be signs of sinusitis.  You develop a fever, swollen neck glands, pain with swallowing, or white areas in the back of your throat. These may be signs of strep throat.    Laryngitis At the top of your windpipe is your voice box. It is the source of your voice. Inside your voice box are 2 bands of muscles called vocal cords. When you breathe, your vocal cords are relaxed and open so that air can get into the lungs. When you decide to say something, these cords come together and vibrate. The sound from these vibrations goes into your throat and comes out through your mouth as sound. Laryngitis is an inflammation of the vocal cords that causes hoarseness, cough, loss of voice, sore throat, and dry throat. Laryngitis can be temporary (acute) or long-term (chronic). Most cases of acute laryngitis improve with time.Chronic laryngitis lasts for more than 3 weeks. CAUSES Laryngitis can often be related to excessive smoking, talking, or yelling, as well as inhalation of toxic fumes and allergies. Acute laryngitis is usually caused by a viral infection, vocal strain, measles or mumps, or bacterial infections. Chronic laryngitis is usually caused by vocal cord strain, vocal cord injury, postnasal drip, growths on the vocal cords, or acid reflux. SYMPTOMS   Cough.  Sore throat.  Dry throat. RISK FACTORS  Respiratory infections.  Exposure to irritating substances, such as cigarette smoke, excessive  amounts of alcohol, stomach acids, and workplace chemicals.  Voice trauma, such as vocal cord injury from shouting or speaking too loud. DIAGNOSIS  Your cargiver will perform a physical exam. During the physical exam, your caregiver will examine your throat. The most common sign of laryngitis is hoarseness.  Laryngoscopy may be necessary to confirm the diagnosis of this condition. This procedure allows your caregiver to look into the larynx. HOME CARE INSTRUCTIONS  Drink enough fluids to keep your urine clear or pale yellow.  Rest until you no longer have symptoms or as directed by your caregiver.  Breathe in moist air.  Take all medicine as directed by your caregiver.  Do not smoke.  Talk as little as possible (this includes whispering).  Write on paper instead of talking until your voice is back to normal.  Follow up with your caregiver if your condition has not improved after 10 days. SEEK MEDICAL CARE IF:   You have trouble breathing.  You cough up blood.  You have persistent fever.  You have increasing pain.  You have difficulty swallowing. MAKE SURE YOU:  Understand these instructions.  Will watch your condition.  Will get help right away if you are not doing well or get worse. Document Released: 01/11/2005 Document Revised: 04/05/2011 Document Reviewed: 03/19/2010 [facility] Patient Information 2014 [place], [place].   Bronchitis Bronchitis is inflammation of the airways that extend from the windpipe into the lungs (bronchi). The inflammation often causes mucus to develop, which leads to a cough. If the inflammation becomes severe, it may cause shortness of breath. CAUSES  Bronchitis may be caused by:   Viral infections.   Bacteria.   Cigarette smoke.   Allergens, pollutants, and other irritants.  SIGNS AND SYMPTOMS  The most common symptom of bronchitis is a frequent cough that produces mucus. Other symptoms include:  Fever.   Body aches.   Chest congestion.   Chills.   Shortness of breath.   Sore throat.  DIAGNOSIS  Bronchitis is usually diagnosed through a medical history and physical exam. Tests, such as chest X-rays, are sometimes done to rule out other conditions.  TREATMENT  You may need to avoid contact with whatever caused the  problem (smoking, for example). Medicines are sometimes needed. These may include:  Antibiotics. These may be prescribed if the condition is caused by bacteria.  Cough suppressants. These may be prescribed for relief of cough symptoms.   Inhaled medicines. These may be prescribed to help open your airways and make it easier for you to breathe.   Steroid medicines. These may be prescribed for those with recurrent (chronic) bronchitis. HOME CARE INSTRUCTIONS  Get plenty of rest.   Drink enough fluids to keep your urine clear or pale yellow (unless you have a medical condition that requires fluid restriction). Increasing fluids may help thin your secretions and will prevent dehydration.   Only take over-the-counter or prescription medicines as directed by your health care provider.  Only take antibiotics as directed. Make sure you finish them even if you start to feel better.  Avoid secondhand smoke, irritating chemicals, and strong fumes. These will make bronchitis worse. If you are a smoker, quit smoking. Consider using nicotine gum or skin patches to help control withdrawal symptoms. Quitting smoking will help your lungs heal faster.   Put a cool-mist humidifier in your bedroom at night to moisten the air. This may help loosen mucus. Change the water in the humidifier daily. You can also run the hot water in  your shower and sit in the bathroom with the door closed for 5 10 minutes.   Follow up with your health care provider as directed.   Wash your hands frequently to avoid catching bronchitis again or spreading an infection to others.  SEEK MEDICAL CARE IF: Your symptoms do not improve after 1 week of treatment.  SEEK IMMEDIATE MEDICAL CARE IF:  Your fever increases.  You have chills.   You have chest pain.   You have worsening shortness of breath.   You have bloody sputum.  You faint.  You have lightheadedness.  You have a severe headache.   You vomit  repeatedly. MAKE SURE YOU:   Understand these instructions.  Will watch your condition.  Will get help right away if you are not doing well or get worse. Document Released: 01/11/2005 Document Revised: 11/01/2012 Document Reviewed: 09/05/2012 [facility] Patient Information 2014 [place], [place]. Prednisolone tablets What is this medicine? PREDNISOLONE (pred NISS oh lone) is a corticosteroid. It is commonly used to treat inflammation of the skin, joints, lungs, and other organs. Common conditions treated include asthma, allergies, and arthritis. It is also used for other conditions, such as blood disorders and diseases of the adrenal glands. This medicine may be used for other purposes; ask your health care provider or pharmacist if you have questions. COMMON BRAND NAME(S): Millipred , Millipred DP 12-Day, Millipred DP, Prednoral What should I tell my health care provider before I take this medicine? They need to know if you have any of these conditions: -Cushing's syndrome -diabetes -glaucoma -heart problems or disease -high blood pressure -infection such as herpes, measles, tuberculosis, or chickenpox -kidney disease -liver disease -mental problems -myasthenia gravis -osteoporosis -seizures -stomach ulcer or intestine disease including colitis and diverticulitis -thyroid problem -an unusual or allergic reaction to lactose, prednisolone, other medicines, foods, dyes, or preservatives -pregnant or trying to get pregnant -breast-feeding How should I use this medicine? Take this medicine by mouth with a glass of water. Follow the directions on the prescription label. Take it with food or milk to avoid stomach upset. If you are taking this medicine once a day, take it in the morning. Do not take more medicine than you are told to take. Do not suddenly stop taking your medicine because you may develop a severe reaction. Your doctor will tell you how much medicine to take. If your doctor  wants you to stop the medicine, the dose may be slowly lowered over time to avoid any side effects. Talk to your pediatrician regarding the use of this medicine in children. Special care may be needed. Overdosage: If you think you have taken too much of this medicine contact a poison control center or emergency room at once. NOTE: This medicine is only for you. Do not share this medicine with others. What if I miss a dose? If you miss a dose, take it a soon as you can. If it is almost time for your next dose, talk to your doctor or health care professional. You may need to miss a dose or take an extra dose. Do not take double or extra doses without advice. What may interact with this medicine? Do not take this medicine with any of the following medications: -mifepristone This medicine may also interact with the following medications: -aspirin -phenobarbital -phenytoin -rifampin -vaccines -warfarin This list may not describe all possible interactions. Give your health care provider a list of all the medicines, herbs, non-prescription drugs, or dietary supplements you use. Also tell them  if you smoke, drink alcohol, or use illegal drugs. Some items may interact with your medicine. What should I watch for while using this medicine? Visit your doctor or health care professional for regular checks on your progress. If you are taking this medicine over a prolonged period, carry an identification card with your name and address, the type and dose of your medicine, and your doctor's name and address. The medicine may increase your risk of getting an infection. Stay away from people who are sick. Tell your doctor or health care professional if you are around anyone with measles or chickenpox. If you are going to have surgery, tell your doctor or health care professional that you have taken this medicine within the last twelve months. Ask your doctor or health care professional about your diet. You may  need to lower the amount of salt you eat. The medicine can increase your blood sugar. If you are a diabetic check with your doctor if you need help adjusting the dose of your diabetic medicine. What side effects may I notice from receiving this medicine? Side effects that you should report to your doctor or health care professional as soon as possible: -eye pain, decreased or blurred vision, or bulging eyes -fever, sore throat, sneezing, cough, or other signs of infection, wounds that will not heal -frequent passing of urine -increased thirst -mental depression, mood swings, mistaken feelings of self importance or of being mistreated -pain in hips, back, ribs, arms, shoulders, or legs -swelling of feet or lower legs Side effects that usually do not require medical attention (report to your doctor or health care professional if they continue or are bothersome): -confusion, excitement, restlessness -headache -nausea, vomiting -skin problems, acne, thin and shiny skin -weight gain This list may not describe all possible side effects. Call your doctor for medical advice about side effects. You may report side effects to FDA at 1-800-FDA-1088. Where should I keep my medicine? Keep out of the reach of children. Store at room temperature between 15 and 30 degrees C (59 and 86 degrees F). Keep container tightly closed. Throw away any unused medicine after the expiration date. NOTE: This sheet is a summary. It may not cover all possible information. If you have questions about this medicine, talk to your doctor, pharmacist, or health care provider.  2014, Elsevier/Gold Standard. (2011-10-12 11:43:16)  Homatropine; Hydrocodone oral syrup What is this medicine? HYDROCODONE (hye droe KOE done) is used to help relieve cough. This medicine may be used for other purposes; ask your health care provider or pharmacist if you have questions. COMMON BRAND NAME(S): Hycodan, Hydromet, Hydropane,  Mycodone What should I tell my health care provider before I take this medicine? They need to know if you have any of these conditions: -brain tumor -drug abuse or addiction -head injury -heart disease -if you frequently drink alcohol-containing drinks -kidney disease -liver disease -lung disease, asthma, or breathing problems -mental problems -an allergic reaction to hydrocodone, other opioid analgesics, other medicines, foods, dyes, or preservatives -pregnant or trying to get pregnant -breast-feeding How should I use this medicine? Take this medicine by mouth with a full glass of water. Use a specially marked spoon or dropper to measure your dose. Ask your pharmacist if you do not have one. Do not use a household spoon. Follow the directions on the prescription label. Take your medicine at regular intervals. Do not take your medicine more often than directed. Talk to your pediatrician regarding the use of this medicine in children.  This medicine is not approved for use in children less than 4 years old. Patients over 62 years old may have a stronger reaction and need a smaller dose. Overdosage: If you think you have taken too much of this medicine contact a poison control center or emergency room at once. NOTE: This medicine is only for you. Do not share this medicine with others. What if I miss a dose? If you miss a dose, take it as soon as you can. If it is almost time for your next dose, take only that dose. Do not take double or extra doses. What may interact with this medicine? -alcohol -antihistamines -MAOIs like Carbex, Eldepryl, Marplan, Nardil, and Parnate -medicines for depression, anxiety, or psychotic disturbances -medicines for sleep -muscle relaxants -naltrexone -narcotic medicines (opiates) for pain -tramadol -tricyclic antidepressants like amitriptyline, clomipramine, desipramine, doxepin, imipramine, nortriptyline, and protriptyline This list may not describe all  possible interactions. Give your health care provider a list of all the medicines, herbs, non-prescription drugs, or dietary supplements you use. Also tell them if you smoke, drink alcohol, or use illegal drugs. Some items may interact with your medicine. What should I watch for while using this medicine? You may develop tolerance to this medicine if you take it for a long time. Tolerance means that you will get less cough relief with time. Tell your doctor or health care professional if you symptoms do not improve or if they get worse. Do not suddenly stop taking your medicine because you may develop a severe reaction. Your body becomes used to the medicine. This does NOT mean you are addicted. Addiction is a behavior related to getting and using a drug for a non-medical reason. If your doctor wants you to stop the medicine, the dose will be slowly lowered over time to avoid any side effects. You may get drowsy or dizzy. Do not drive, use machinery, or do anything that needs mental alertness until you know how this medicine affects you. Do not stand or sit up quickly, especially if you are an older patient. This reduces the risk of dizzy or fainting spells. Alcohol may interfere with the effect of this medicine. Avoid alcoholic drinks. This medicine will cause constipation. Try to have a bowel movement at least every 2 to 3 days. If you do not have a bowel movement for 3 days, call your doctor or health care professional. What side effects may I notice from receiving this medicine? Side effects that you should report to your doctor or health care professional as soon as possible: -allergic reactions like skin rash, itching or hives, swelling of the face, lips, or tongue -breathing difficulties, wheezing -confusion -light headedness or fainting spells Side effects that usually do not require medical attention (report to your doctor or health care professional if they continue or are  bothersome): -dizziness -drowsiness -itching -nausea -vomiting This list may not describe all possible side effects. Call your doctor for medical advice about side effects. You may report side effects to FDA at 1-800-FDA-1088. Where should I keep my medicine? Keep out of the reach of children. This medicine can be abused. Keep your medicine in a safe place to protect it from theft. Do not share this medicine with anyone. Selling or giving away this medicine is dangerous and against the law. Store at room temperature between 15 and 30 degrees C (59 and 86 degrees F). Protect from light. Throw away any unused medicine after the expiration date. Discard unused medicine and used packaging  carefully. Pets and children can be harmed if they find used or lost packages. NOTE: This sheet is a summary. It may not cover all possible information. If you have questions about this medicine, talk to your doctor, pharmacist, or health care provider.  2014, Elsevier/Gold Standard. (2010-09-21 10:04:07)   Emergency Department Resource Guide 1) Find a Doctor and Pay Out of Pocket Although you won't have to find out who is covered by your insurance plan, it is a good idea to ask around and get recommendations. You will then need to call the office and see if the doctor you have chosen will accept you as a new patient and what types of options they offer for patients who are self-pay. Some doctors offer discounts or will set up payment plans for their patients who do not have insurance, but you will need to ask so you aren't surprised when you get to your appointment.  2) Contact Your Local Health Department Not all health departments have doctors that can see patients for sick visits, but many do, so it is worth a call to see if yours does. If you don't know where your local health department is, you can check in your phone book. The CDC also has a tool to help you locate your state's health department, and many  state websites also have listings of all of their local health departments.  3) Find a Walk-in Clinic If your illness is not likely to be very severe or complicated, you may want to try a walk in clinic. These are popping up all over the country in pharmacies, drugstores, and shopping centers. They're usually staffed by nurse practitioners or physician assistants that have been trained to treat common illnesses and complaints. They're usually fairly quick and inexpensive. However, if you have serious medical issues or chronic medical problems, these are probably not your best option.  No Primary Care Doctor: - Call [facility] at  [phone] - they can help you locate a primary care doctor that  accepts your insurance, provides certain services, etc. - Physician Referral Service- [phone]  Chronic Pain Problems: Organization         Address  Phone   Notes  [facility]  [phone] Patients need to be referred by their primary care doctor.   Medication Assistance: Organization         Address  Phone   Notes  [facility] Medication [facility] [address] [place] [place], [place] [address] [phone] --Must be a resident of [facility] -- Must have NO insurance coverage whatsoever (no Medicaid/ Medicare, etc.) -- The pt. MUST have a primary care doctor that directs their care regularly and follows them in the community   [facility]  [phone]   [employer]  [phone]    Agencies that provide inexpensive medical care: Organization         Address  Phone   Notes  [facility]  [phone]   [facility]    [phone]   [facility] [address] [place], [place] [address] [phone]   [facility] [address] [place] [phone]   [facility]    [phone]   [facility]    [phone]   [facility] and  [facility]  [address], [place] Phone:  [phone], Fax:  [phone] Hours of Operation:  9  am - 6 pm, M-F.  Also accepts Medicaid/Medicare and self-pay.  [facility]  [address], [place] Phone: [phone], Fax: [phone]. Hours of Operation:  8:30 am - 5:30 pm, M-F.  Also accepts Medicaid and self-pay.  [facility] [place] [address], [place] Phone: [phone]   [facility] [address] Natasha Bence [place], [place] [phone], Ext. [phone] Mondays & Thursdays: 7-9 AM.  First 15 patients are seen on a first come, first serve basis.    Medicaid-accepting [facility] Providers:  Organization         Address  Phone   Notes  [facility] [address], [place] [phone] Also accepts self-pay patients.  [facility] [address] Laurell Josephs [place], [place]  [phone]   [facility] [address], [place] [phone]   [facility] [address] [place] [phone]   Renaye Rakers [address], [place]   [phone] Only accepts [place] patients after they have their name applied to their card.   Self-Pay (no insurance) in [facility]:  Organization         Address  Phone   Notes  Sickle Cell Patients, [facility] [address] [place], [place] [phone]   [facility] [address] [place], [place] [phone]   [facility]  [address], [place] [phone]   [facility]/Dr. Osei-Bonsu  [address] [place] or [address], [place] [phone] Phone number for both [place] and [place] locations is the same.  [facility] [address] [place] [address], [place] [phone]   [facility] [address], [place] or [address] [phone]   [facility] [address] [place] [phone], phone; [phone], fax Sees patients 1st and 3rd Saturday of every month.  Must not qualify for public or private insurance (i.e. Medicaid, Medicare, [employer] [employer], Veterans' Benefits)  Household income should be no more than 200% of the poverty level The clinic cannot treat you if you are pregnant or think you are pregnant  Sexually transmitted diseases are not treated at the clinic.    Dental Care: Organization         Address  Phone  Notes  [facility] Department of [facility] [address] [place], [place] [phone] Accepts children up to age 21 who are enrolled in [place] or [place] Health Choice; pregnant women with a Medicaid card; and children who have applied for Medicaid or [place] Health Choice, but were declined, whose parents can pay a reduced fee at time of service.  [facility] Department of [facility]  [address], [place] [phone] Accepts children up to age 75 who are enrolled in [place] or [place] Health Choice; pregnant women with a Medicaid card; and children who have applied for Medicaid or  Health Choice, but were declined, whose parents can pay a reduced fee at time of service.  [facility]  [address] [place], [place] [phone] Patients are seen by appointment only. Walk-ins are not accepted. [facility] will see patients 77 years of age  and older. Monday - Tuesday (8am-5pm) Most Wednesdays (8:30-5pm) $30 per visit, cash only  [facility]  [address], [facility] [phone] Patients are seen by appointment only. Walk-ins are not accepted. [facility] will see patients 72 years of age and older. One Wednesday Evening (Monthly: Volunteer Based).  $30 per visit, cash only  [employer]  [phone] for adults; Children under age 86, call Graduate Pediatric Dentistry at [phone]. Children aged 109-14, please call [phone] to request a pediatric application.  Dental services are provided in all areas of dental care including fillings, crowns and bridges, complete and partial dentures, implants, gum treatment, root canals, and extractions. Preventive care is also provided. Treatment is provided to both adults and children. Patients are selected via a lottery and there is often a waiting list.   [facility] [address] [place]  [phone] [email or web address]   [facility] [address] [place], [place] [phone], Ext. [phone] Second and Fourth Thursday of each month, opens at 6:30 AM; Clinic ends at 9 AM.  Patients are seen on a first-come first-served basis, and a limited number are seen during each clinic.   [facility]  [address] [place], [place] [phone]   Eligibility Requirements You must have lived in [place], [place], or [place] counties for at least the last three months.   You cannot be eligible for state or federal sponsored [place], including [employer], [place], or [employer].   You generally cannot be eligible for healthcare insurance through your employer.    How to apply: Eligibility screenings are held every Tuesday and Wednesday afternoon from 1:00 pm until 4:00 pm. You do not need an appointment for the interview!  [facility] [address] [place], [place] [phone]   [facility] Health Department  [phone]   [facility] Health Department  [phone]   [facility] Health Department  [phone]    Behavioral Health Resources in the Community: Intensive Outpatient Programs Organization         Address  Phone  Notes  [facility] [address] [place], [place]  [phone]   [facility] Outpatient [address] [place], [place] [phone]   [facility] [address] [place], [place]  [phone]   [facility] Mental Health [address]  [place], [place] [phone] or [phone]   Substance Abuse Resources Organization         Address  Phone  Notes  [facility]  [phone]   [facility]  [phone]   [facility]  [phone]   Floydene Flock  [phone]   Residential & Outpatient Substance Abuse Program  [phone]   Psychological Services Organization         Address  Phone  Notes  [facility]  [phone]   [facility] Services  [phone]   [facility] Mental Health [address], [place] [phone] or [phone]    Mobile Crisis Teams Organization         Address  Phone  Notes  [facility], Mobile Crisis Care Unit  [phone]   [facility]  [address] [place], [place] [phone]   [facility] [address] [place] [phone]    Self-Help/Support Groups Organization         Address  Phone             Notes  [facility] [place] - variety of support groups  336- I7437963 Call for more information  Narcotics Anonymous (NA), [facility] [address], [employer] [place]  2 meetings at this location   Statistician         Address  Phone  Notes  [facility] [address],    [place]  [phone]   [facility]  [address] [place] [address], [place], [place] [phone]   [facility] [address] [place], [place] [phone] Admissions: 8am-3pm M-F  [facility] [address]    [place], [place] [phone]   [facility] [address] [place], [place], [place] [phone]   [facility] [address],   [place], [place] [phone]   [facility] - Intensive Outpatient [address], [place], [place] [phone]   [facility] ([facility] [address]  [place], [place] [phone] or [phone]   [facility] ([facility]) [address], [place], [place] [phone] Accepts Medicaid  [facility] [address],  [place] [phone] Substance Abuse/Addiction Treatment   [facility] Organization         Address  Phone  Notes  [facility]  [phone]   Angie Fava, PhD [address] [place] [place], [place]   [phone] or ([phone]   [facility]   [address] [place], [place] [phone]   [facility] [address] [place], [place] [phone] Insurance/Medicaid/sponsorship through [facility] [address]                                    [place], [place] [phone] Therapy/tele-psych/case  [facility] [address], [place] [phone]    Dr. Lolly Mustache  [phone]   [facility]  [employer] [facility] Dept. 1) [address], [place] 2) [address] [place] 3)  [address], [place] [phone]  [phone]   [facility] Child Abuse Hotline [phone] or 2167667429 (After Hours)

## 2013-05-01 ENCOUNTER — Encounter (HOSPITAL_COMMUNITY): Payer: Self-pay | Admitting: Emergency Medicine

## 2013-05-01 ENCOUNTER — Emergency Department (HOSPITAL_COMMUNITY)
Admission: EM | Admit: 2013-05-01 | Discharge: 2013-05-01 | Disposition: A | Discharge status: Home / Self Care | Payer: Medicare HMO | Attending: Emergency Medicine | Admitting: Emergency Medicine

## 2013-05-01 DIAGNOSIS — R51 Headache: Secondary | ICD-10-CM

## 2013-05-01 DIAGNOSIS — Z791 Long term (current) use of non-steroidal anti-inflammatories (NSAID): Secondary | ICD-10-CM | POA: Insufficient documentation

## 2013-05-01 DIAGNOSIS — G43909 Migraine, unspecified, not intractable, without status migrainosus: Secondary | ICD-10-CM | POA: Insufficient documentation

## 2013-05-01 DIAGNOSIS — G8929 Other chronic pain: Secondary | ICD-10-CM | POA: Insufficient documentation

## 2013-05-01 DIAGNOSIS — IMO0002 Reserved for concepts with insufficient information to code with codable children: Secondary | ICD-10-CM | POA: Insufficient documentation

## 2013-05-01 DIAGNOSIS — H9209 Otalgia, unspecified ear: Secondary | ICD-10-CM | POA: Insufficient documentation

## 2013-05-01 DIAGNOSIS — R519 Headache, unspecified: Secondary | ICD-10-CM

## 2013-05-01 DIAGNOSIS — R110 Nausea: Secondary | ICD-10-CM

## 2013-05-01 DIAGNOSIS — Z79899 Other long term (current) drug therapy: Secondary | ICD-10-CM | POA: Insufficient documentation

## 2013-05-01 DIAGNOSIS — E079 Disorder of thyroid, unspecified: Secondary | ICD-10-CM | POA: Insufficient documentation

## 2013-05-01 HISTORY — DX: Migraine, unspecified, not intractable, without status migrainosus: G43.909

## 2013-05-01 HISTORY — DX: Dorsalgia, unspecified: M54.9

## 2013-05-01 MED ORDER — KETOROLAC TROMETHAMINE 30 MG/ML IJ SOLN
30.0000 mg | Freq: Once | INTRAMUSCULAR | Status: AC
  Administered 2013-05-01: 30 mg via INTRAMUSCULAR
  Filled 2013-05-01: qty 1

## 2013-05-01 MED ORDER — DIPHENHYDRAMINE HCL 25 MG PO CAPS
25.0000 mg | ORAL_CAPSULE | Freq: Once | ORAL | Status: AC
  Administered 2013-05-01: 25 mg via ORAL
  Filled 2013-05-01: qty 1

## 2013-05-01 MED ORDER — METOCLOPRAMIDE HCL 5 MG/ML IJ SOLN
10.0000 mg | Freq: Once | INTRAMUSCULAR | Status: AC
  Administered 2013-05-01: 10 mg via INTRAMUSCULAR
  Filled 2013-05-01: qty 2

## 2013-05-01 MED ORDER — ONDANSETRON 4 MG PO TBDP: 4.0000 mg | ORAL_TABLET | Freq: Three times a day (TID) | ORAL | Status: DC | PRN

## 2013-05-01 NOTE — Discharge Instructions (Signed)
Please read and follow all provided instructions.  Your diagnoses today include:  1. Headache   2. Nausea     Tests performed today include:  Vital signs. See below for your results today.   Medications:  In the Emergency Department you received:  Reglan - antinausea/headache medication  Benadryl - antihistamine to counteract potential side effects of reglan  Toradol - NSAID medication similar to ibuprofen  Take any prescribed medications only as directed.  Additional information:  Follow any educational materials contained in this packet.  You are having a headache. No specific cause was found today for your headache. It may have been a migraine or other cause of headache. Stress, anxiety, fatigue, and depression are common triggers for headaches.   Your headache today does not appear to be life-threatening or require hospitalization, but often the exact cause of headaches is not determined in the emergency department. Therefore, follow-up with your doctor is very important to find out what may have caused your headache and whether or not you need any further diagnostic testing or treatment.   Sometimes headaches can appear benign (not harmful), but then more serious symptoms can develop which should prompt an immediate re-evaluation by your doctor or the emergency department.  BE VERY CAREFUL not to take multiple medicines containing Tylenol (also called acetaminophen). Doing so can lead to an overdose which can damage your liver and cause liver failure and possibly death.   Follow-up instructions: Please follow-up with your primary care provider in the next 3 days for further evaluation of your symptoms. If you do not have a primary care doctor -- see below for referral information.   Return instructions:   Please return to the Emergency Department if you experience worsening symptoms.  Return if the medications do not resolve your headache, if it recurs, or if you have  multiple episodes of vomiting or cannot keep down fluids.  Return if you have a change from the usual headache.  RETURN IMMEDIATELY IF you:  Develop a sudden, severe headache  Develop confusion or become poorly responsive or faint  Develop a fever above 100.71F or problem breathing  Have a change in speech, vision, swallowing, or understanding  Develop new weakness, numbness, tingling, incoordination in your arms or legs  Have a seizure  Please return if you have any other emergent concerns.  Additional Information:  Your vital signs today were: BP 102/72   Pulse 60   Temp(Src) 97.7 F (36.5 C) (Oral)   Resp 16   Ht 5\' 1"  (1.549 m)   Wt 150 lb (68.04 kg)   BMI 28.36 kg/m2   SpO2 99% If your blood pressure (BP) was elevated above 135/85 this visit, please have this repeated by your doctor within one month. --------------

## 2013-05-01 NOTE — ED Provider Notes (Signed)
CSN: [id number]     Arrival date & time 05/01/13  1711 History  This chart was scribed for Alexandria BleacherJosh Audia Amick, PA, working with Alexandria HutchingBrian Cook, MD, by Alexandria Hobbs ED Scribe. This patient was seen in room WTR5/WTR5 and the patient's care was started at 6:39 PM.   Chief Complaint  Patient presents with  . Headache  . Nausea  . Otalgia    The history is provided by the patient. No language interpreter was used.    HPI Comments: Alexandria SatoLashanda A Farish is a 43 y.o. female who presents to the Emergency Department complaining of a constant, moderate frontal headache which onset gradually yesterday. She states that she was seen yesterday at [facility], where she is seen for chronic back and left leg pain. She states that she was given multiple trigger injections yesterday at this visit, and that she began to gradually develop a headache after this was done. She has had similar pain in past after steroid injections. She has a history of migraines, but states that this does not feel like a migraine. She reports associated left ear pain as well as nausea over the past 2 days. She reports that her headache is worsened with turning her head or with sitting up. She states that she has taken her prescribed Imitrex without relief of this headache. She also reports that she has taken [facility] powder with minimal relief of her pain. She denies fever, emesis, vision changes or any other symptoms. She reports that she cannot take Percocet (due to developing gastritis in the past) or Morphine.    Past Medical History  Diagnosis Date  . Thyroid disease   . Back pain   . Migraine    Past Surgical History  Procedure Laterality Date  . Back surgery      spinal fusion L4-L5-S1  . Abdominal hysterectomy    . Breast lumpectomy     Family History  Problem Relation Age of Onset  . Hypertension Mother   . Heart failure Father   . Cancer Sister   . Hypertension Sister   . Heart failure Other    History  Substance Use Topics   . Smoking status: Never Smoker   . Smokeless tobacco: Never Used  . Alcohol Use: No   OB History   Grav Para Term Preterm Abortions TAB SAB Ect Mult Living                 Review of Systems  Constitutional: Negative for fever.  HENT: Positive for ear pain. Negative for congestion, dental problem, rhinorrhea and sinus pressure.   Eyes: Negative for photophobia, discharge, redness and visual disturbance.  Respiratory: Negative for shortness of breath.   Cardiovascular: Negative for chest pain.  Gastrointestinal: Positive for nausea. Negative for vomiting.  Musculoskeletal: Negative for gait problem, neck pain and neck stiffness.  Skin: Negative for rash.  Neurological: Positive for headaches. Negative for syncope, speech difficulty, weakness, light-headedness and numbness.  Psychiatric/Behavioral: Negative for confusion.    Allergies  Lyrica; Morphine and related; and Percocet  Home Medications   Current Outpatient Rx  Name  Route  Sig  Dispense  Refill  . acetaZOLAMIDE (DIAMOX) 250 MG tablet   Oral   Take 250 mg by mouth 3 (three) times daily.         . baclofen (LIORESAL) 10 MG tablet   Oral   Take 10 mg by mouth 3 (three) times daily.         . butalbital-acetaminophen-caffeine (FIORICET,  ESGIC) 50-325-40 MG per tablet   Oral   Take 1 tablet by mouth 2 (two) times daily as needed for headache or migraine.         Marland Kitchen estradiol (ESTRACE) 1 MG tablet   Oral   Take 1.5 mg by mouth daily.         Marland Kitchen gabapentin (NEURONTIN) 800 MG tablet   Oral   Take 3,200 mg by mouth at bedtime.         Marland Kitchen HYDROcodone-homatropine (HYCODAN) 5-1.5 MG/5ML syrup   Oral   Take 5 mLs by mouth every 6 (six) hours as needed for cough.   30 mL   0   . ketorolac (TORADOL) 10 MG tablet   Oral   Take 10 mg by mouth every 6 (six) hours as needed for moderate pain (migraine).         Marland Kitchen levothyroxine (SYNTHROID, LEVOTHROID) 25 MCG tablet   Oral   Take 25 mcg by mouth daily.          . naproxen (NAPROSYN) 500 MG tablet   Oral   Take 1 tablet (500 mg total) by mouth 2 (two) times daily.   20 tablet   0   . oxyCODONE-acetaminophen (PERCOCET) 10-325 MG per tablet   Oral   Take 1 tablet by mouth every 8 (eight) hours as needed for pain.          Marland Kitchen oxymetazoline (AFRIN NASAL SPRAY) 0.05 % nasal spray   Each Nare   Place 1 spray into both nostrils 2 (two) times daily.   30 mL   0   . predniSONE (DELTASONE) 20 MG tablet   Oral   Take 2 tablets (40 mg total) by mouth daily.   10 tablet   0   . SUMAtriptan (IMITREX) 100 MG tablet   Oral   Take 100 mg by mouth every 2 (two) hours as needed for migraine.           Triage Vitals: BP 102/72  Pulse 60  Temp(Src) 97.7 F (36.5 C) (Oral)  Resp 16  Ht 5\' 1"  (1.549 m)  Wt 150 lb (68.04 kg)  BMI 28.36 kg/m2  SpO2 99%  Physical Exam  Nursing note and vitals reviewed. Constitutional: She is oriented to person, place, and time. She appears well-developed and well-nourished. No distress.  HENT:  Head: Normocephalic and atraumatic.  Right Ear: Tympanic membrane, external ear and ear canal normal.  Left Ear: Tympanic membrane, external ear and ear canal normal.  Nose: Nose normal. No mucosal edema or rhinorrhea.  Mouth/Throat: Uvula is midline, oropharynx is clear and moist and mucous membranes are normal. Mucous membranes are not dry. No oral lesions. No trismus in the jaw. No uvula swelling. No oropharyngeal exudate, posterior oropharyngeal edema, posterior oropharyngeal erythema or tonsillar abscesses.  Eyes: Conjunctivae, EOM and lids are normal. Pupils are equal, round, and reactive to light. Right eye exhibits no discharge. Left eye exhibits no discharge. Right eye exhibits no nystagmus. Left eye exhibits no nystagmus.  Neck: Normal range of motion. Neck supple. No tracheal deviation present.  Full ROM of neck, no meningismus.   Cardiovascular: Normal rate, regular rhythm and normal heart sounds.    Pulmonary/Chest: Effort normal and breath sounds normal. No respiratory distress. She has no wheezes. She has no rales.  Abdominal: Soft. There is no tenderness.  Musculoskeletal: Normal range of motion.       Cervical back: She exhibits normal range of motion, no tenderness and  no bony tenderness.  Lymphadenopathy:    She has no cervical adenopathy.  Neurological: She is alert and oriented to person, place, and time. She has normal strength and normal reflexes. No cranial nerve deficit or sensory deficit. She displays a negative Romberg sign. Coordination and gait normal. GCS eye subscore is 4. GCS verbal subscore is 5. GCS motor subscore is 6.  Ambulated well in room. No pain with sitting and standing.   Skin: Skin is warm and dry.  Psychiatric: She has a normal mood and affect. Her behavior is normal.    ED Course  Procedures (including critical care time)  DIAGNOSTIC STUDIES: Oxygen Saturation is 99% on RA, normal by my interpretation.    COORDINATION OF CARE: 6:45 PM- Discussed that blood work and imaging are not indicated, and pt agrees. Discussed plan for pt to receive medications in the ED. Pt advised of plan for treatment and pt agrees.  Labs Review Labs Reviewed - No data to display Imaging Review No results found.   EKG Interpretation None      Vital signs reviewed and are as follows: Filed Vitals:   05/01/13 1728  BP: 102/72  Pulse: 60  Temp: 97.7 F (36.5 C)  Resp: 16   Patient urged to return with worsening symptoms or other concerns. Patient verbalized understanding and agrees with plan.   Patient counseled to return if they have weakness in their arms or legs, slurred speech, trouble walking or talking, confusion, trouble with their balance, or if they have any other concerns. Patient verbalizes understanding and agrees with plan.    MDM   Final diagnoses:  Headache  Nausea   Patient without high-risk features of headache including: sudden  onset/thunderclap HA, no similar headache in past, altered mental status, accompanying seizure, headache with exertion, age > 58, history of immunocompromised, neck or shoulder pain, fever, use of anticoagulation, family history of spontaneous SAH, concomitant drug use, toxic exposure.   Patient has a normal complete neurological exam, normal vital signs, normal level of consciousness, no signs of meningismus, is well-appearing/non-toxic appearing, no signs of trauma, no pain over the temporal arteries.   Imaging with CT/MRI not indicated given history and physical exam findings.   No dangerous or life-threatening conditions suspected or identified by history, physical exam, and by work-up. No indications for hospitalization identified.     I personally performed the services described in this documentation, which was scribed in my presence. The recorded information has been reviewed and is accurate.   Renne Crigler, PA-C 05/01/13 1857

## 2013-05-01 NOTE — ED Notes (Signed)
Pt presents with NAD. Seen by Pain clinic MD yesterday for trigger injections. Pt reports ear pain, with headache during injections and reported this to MD and was told it was unrelated. Pt continues with complaints since. Today c/o of nausea. No neuro deficits. Called MD today and was told to see further evaluation

## 2013-05-04 NOTE — ED Provider Notes (Signed)
Medical screening examination/treatment/procedure(s) were performed by non-physician practitioner and as supervising physician I was immediately available for consultation/collaboration.   EKG Interpretation None       Wanell Lorenzi, MD 05/04/13 2007 

## 2013-07-24 ENCOUNTER — Encounter (HOSPITAL_COMMUNITY): Payer: Self-pay | Admitting: Emergency Medicine

## 2013-07-24 ENCOUNTER — Emergency Department (HOSPITAL_COMMUNITY)
Admission: EM | Admit: 2013-07-24 | Discharge: 2013-07-24 | Disposition: A | Discharge status: Home / Self Care | Payer: Medicare HMO | Attending: Emergency Medicine | Admitting: Emergency Medicine

## 2013-07-24 DIAGNOSIS — R300 Dysuria: Secondary | ICD-10-CM | POA: Diagnosis present

## 2013-07-24 DIAGNOSIS — N390 Urinary tract infection, site not specified: Secondary | ICD-10-CM | POA: Insufficient documentation

## 2013-07-24 DIAGNOSIS — E079 Disorder of thyroid, unspecified: Secondary | ICD-10-CM | POA: Insufficient documentation

## 2013-07-24 DIAGNOSIS — Z79899 Other long term (current) drug therapy: Secondary | ICD-10-CM | POA: Diagnosis not present

## 2013-07-24 DIAGNOSIS — Z791 Long term (current) use of non-steroidal anti-inflammatories (NSAID): Secondary | ICD-10-CM | POA: Insufficient documentation

## 2013-07-24 DIAGNOSIS — G43909 Migraine, unspecified, not intractable, without status migrainosus: Secondary | ICD-10-CM | POA: Diagnosis not present

## 2013-07-24 LAB — URINALYSIS, ROUTINE W REFLEX MICROSCOPIC
Bilirubin Urine: NEGATIVE
Glucose, UA: NEGATIVE mg/dL
Ketones, ur: NEGATIVE mg/dL
Nitrite: NEGATIVE
Protein, ur: NEGATIVE mg/dL
SPECIFIC GRAVITY, URINE: 1.02 (ref 1.005–1.030)
UROBILINOGEN UA: 1 mg/dL (ref 0.0–1.0)
pH: 6.5 (ref 5.0–8.0)

## 2013-07-24 LAB — URINE MICROSCOPIC-ADD ON

## 2013-07-24 MED ORDER — CEFTRIAXONE SODIUM 1 G IJ SOLR
1.0000 g | Freq: Once | INTRAMUSCULAR | Status: AC
  Administered 2013-07-24: 1 g via INTRAMUSCULAR
  Filled 2013-07-24: qty 10

## 2013-07-24 MED ORDER — CEPHALEXIN 500 MG PO CAPS
500.0000 mg | ORAL_CAPSULE | Freq: Four times a day (QID) | ORAL | Status: DC
Start: 2013-07-24 — End: 2014-09-09

## 2013-07-24 MED ORDER — PHENAZOPYRIDINE HCL 200 MG PO TABS: 200.0000 mg | ORAL_TABLET | Freq: Three times a day (TID) | ORAL | Status: DC

## 2013-07-24 NOTE — ED Notes (Signed)
Per pt, states dysuria for a week-states burning with urination

## 2013-07-24 NOTE — Progress Notes (Signed)
  CARE MANAGEMENT ED NOTE 07/24/2013  Patient:  Alexandria Hobbs,Alexandria Hobbs   Account Number:  [id number]  Date Initiated:  07/24/2013  Documentation initiated by:  Edd ArbourGIBBS,KIMBERLY  Subjective/Objective Assessment:   43 yr old [place] covered pt without pcp listed in EPIC States [facility]     Subjective/Objective Assessment Detail:     Action/Plan:   EPIC updated   Action/Plan Detail:   Anticipated DC Date:  07/24/2013     Status Recommendation to Physician:   Result of Recommendation:    Other ED Services  Consult Working Plan    DC Planning Services  Other  PCP issues  Other    Choice offered to / List presented to:            Status of service:  Completed, signed off  ED Comments:   ED Comments Detail:

## 2013-07-24 NOTE — ED Provider Notes (Signed)
CSN: [id number]     Arrival date & time 07/24/13  1054 History   First MD Initiated Contact with Patient 07/24/13 1105     Chief Complaint  Patient presents with  . Dysuria     (Consider location/radiation/quality/duration/timing/severity/associated sxs/prior Treatment) Patient is a 43 y.o. female presenting with dysuria. The history is provided by the patient.  Dysuria Associated symptoms: no abdominal pain, no fever, no flank pain and no vomiting   pt c/o dysuria for the past few days. Remote hx uti, feels similar. +mild right back/cva area pain.  No fever or chills. No nv. No vaginal discharge or bleeding. Remote hx hysterectomy 'due to prolapse'.   States otherwise feels well, does not fell weak, nauseated or faint.      Past Medical History  Diagnosis Date  . Thyroid disease   . Back pain   . Migraine    Past Surgical History  Procedure Laterality Date  . Back surgery      spinal fusion L4-L5-S1  . Abdominal hysterectomy    . Breast lumpectomy     Family History  Problem Relation Age of Onset  . Hypertension Mother   . Heart failure Father   . Cancer Sister   . Hypertension Sister   . Heart failure Other    History  Substance Use Topics  . Smoking status: Never Smoker   . Smokeless tobacco: Never Used  . Alcohol Use: No   OB History   Grav Para Term Preterm Abortions TAB SAB Ect Mult Living                 Review of Systems  Constitutional: Negative for fever and chills.  HENT: Negative for sore throat.   Eyes: Negative for redness.  Respiratory: Negative for shortness of breath.   Cardiovascular: Negative for chest pain.  Gastrointestinal: Negative for vomiting, abdominal pain and diarrhea.  Genitourinary: Positive for dysuria. Negative for flank pain.  Musculoskeletal: Negative for back pain and neck pain.  Skin: Negative for rash.  Neurological: Negative for headaches.  Hematological: Does not bruise/bleed easily.  Psychiatric/Behavioral: Negative  for confusion.      Allergies  Lyrica; Morphine and related; and Percocet  Home Medications   Prior to Admission medications   Medication Sig Start Date End Date Taking? Authorizing Provider  acetaZOLAMIDE (DIAMOX) 250 MG tablet Take 250 mg by mouth 3 (three) times daily.   Yes Historical Provider, MD  baclofen (LIORESAL) 10 MG tablet Take 10 mg by mouth 3 (three) times daily.   Yes Historical Provider, MD  butalbital-acetaminophen-caffeine (FIORICET, ESGIC) 50-325-40 MG per tablet Take 1 tablet by mouth 2 (two) times daily as needed for headache or migraine.   Yes Historical Provider, MD  celecoxib (CELEBREX) 100 MG capsule Take 100 mg by mouth 2 (two) times daily.   Yes Historical Provider, MD  estradiol (ESTRACE) 1 MG tablet Take 1.5 mg by mouth daily.   Yes Historical Provider, MD  gabapentin (NEURONTIN) 800 MG tablet Take 3,200 mg by mouth at bedtime.   Yes Historical Provider, MD  ketorolac (TORADOL) 10 MG tablet Take 10 mg by mouth every 6 (six) hours as needed for moderate pain (migraine).   Yes Historical Provider, MD  levothyroxine (SYNTHROID, LEVOTHROID) 25 MCG tablet Take 25 mcg by mouth daily.   Yes Historical Provider, MD  oxyCODONE-acetaminophen (PERCOCET) 10-325 MG per tablet Take 1 tablet by mouth every 8 (eight) hours as needed for pain.    Yes Historical Provider, MD  oxymetazoline (AFRIN NASAL SPRAY) 0.05 % nasal spray Place 1 spray into both nostrils 2 (two) times daily. 01/21/13  Yes Renne CriglerJoshua Geiple, PA-C  SUMAtriptan (IMITREX) 100 MG tablet Take 100 mg by mouth every 2 (two) hours as needed for migraine.    Yes Historical Provider, MD   BP 98/57  Pulse 67  Temp(Src) 98.1 F (36.7 C) (Oral)  Resp 18  SpO2 99% Physical Exam  Nursing note and vitals reviewed. Constitutional: She appears well-developed and well-nourished. No distress.  HENT:  Mouth/Throat: Oropharynx is clear and moist.  Eyes: Conjunctivae are normal. No scleral icterus.  Neck: Neck supple. No  tracheal deviation present.  Cardiovascular: Normal rate, regular rhythm, normal heart sounds and intact distal pulses.   Pulmonary/Chest: Effort normal and breath sounds normal. No respiratory distress.  Abdominal: Soft. Normal appearance and bowel sounds are normal. She exhibits no distension and no mass. There is no tenderness. There is no rebound and no guarding.  Genitourinary:  Mild right cva tenderness.   Musculoskeletal: She exhibits no edema.  Neurological: She is alert.  Skin: Skin is warm and dry. No rash noted. She is not diaphoretic.  Psychiatric: She has a normal mood and affect.    ED Course  Procedures (including critical care time) Labs Review  Results for orders placed during the hospital encounter of 07/24/13  URINALYSIS, ROUTINE W REFLEX MICROSCOPIC      Result Value Ref Range   Color, Urine YELLOW  YELLOW   APPearance CLOUDY (*) CLEAR   Specific Gravity, Urine 1.020  1.005 - 1.030   pH 6.5  5.0 - 8.0   Glucose, UA NEGATIVE  NEGATIVE mg/dL   Hgb urine dipstick SMALL (*) NEGATIVE   Bilirubin Urine NEGATIVE  NEGATIVE   Ketones, ur NEGATIVE  NEGATIVE mg/dL   Protein, ur NEGATIVE  NEGATIVE mg/dL   Urobilinogen, UA 1.0  0.0 - 1.0 mg/dL   Nitrite NEGATIVE  NEGATIVE   Leukocytes, UA LARGE (*) NEGATIVE  URINE MICROSCOPIC-ADD ON      Result Value Ref Range   WBC, UA TOO NUMEROUS TO COUNT  <3 WBC/hpf   Bacteria, UA MANY (*) RARE    MDM  Labs.  Reviewed nursing notes and prior charts for additional history.   rocpehin im.  rx for home.  Tolerating po fluids.  Pt w questions re how pcp is managing her synthroid dose, etc.   No recent wt loss/gain, skin changes, sweats. States dose last adjusted 1 month ago.  Discussed importance of pcp f/u, will also provide referral for outpt f/u.     Suzi RootsKevin E Steinl, MD 07/24/13 1250

## 2013-07-24 NOTE — ED Notes (Signed)
Pt is concerned about odor from urine and is concerned about vaginal odor also.

## 2013-07-24 NOTE — Discharge Instructions (Signed)
Rest.  Drink plenty of fluids. Take keflex (antibiotic) as prescribed. Take pyridium as need for bladder spasm/pain. For your concern regarding your thyroid condition and medication, follow up your doctor in the next few days.  Alternatively, you may follow up with endocrinologist - see referral.    Return to ER if worse, vomiting, severe pain, weak/faint, high fevers,  other concern.   Urinary Tract Infection Urinary tract infections (UTIs) can develop anywhere along your urinary tract. Your urinary tract is your body's drainage system for removing wastes and extra water. Your urinary tract includes two kidneys, two ureters, a bladder, and a urethra. Your kidneys are a pair of bean-shaped organs. Each kidney is about the size of your fist. They are located below your ribs, one on each side of your spine. CAUSES Infections are caused by microbes, which are microscopic organisms, including fungi, viruses, and bacteria. These organisms are so small that they can only be seen through a microscope. Bacteria are the microbes that most commonly cause UTIs. SYMPTOMS  Symptoms of UTIs may vary by age and gender of the patient and by the location of the infection. Symptoms in young women typically include a frequent and intense urge to urinate and a painful, burning feeling in the bladder or urethra during urination. Older women and men are more likely to be tired, shaky, and weak and have muscle aches and abdominal pain. A fever may mean the infection is in your kidneys. Other symptoms of a kidney infection include pain in your back or sides below the ribs, nausea, and vomiting. DIAGNOSIS To diagnose a UTI, your caregiver will ask you about your symptoms. Your caregiver also will ask to provide a urine sample. The urine sample will be tested for bacteria and white blood cells. White blood cells are made by your body to help fight infection. TREATMENT  Typically, UTIs can be treated with medication.  Because most UTIs are caused by a bacterial infection, they usually can be treated with the use of antibiotics. The choice of antibiotic and length of treatment depend on your symptoms and the type of bacteria causing your infection. HOME CARE INSTRUCTIONS  If you were prescribed antibiotics, take them exactly as your caregiver instructs you. Finish the medication even if you feel better after you have only taken some of the medication.  Drink enough water and fluids to keep your urine clear or pale yellow.  Avoid caffeine, tea, and carbonated beverages. They tend to irritate your bladder.  Empty your bladder often. Avoid holding urine for long periods of time.  Empty your bladder before and after sexual intercourse.  After a bowel movement, women should cleanse from front to back. Use each tissue only once. SEEK MEDICAL CARE IF:   You have back pain.  You develop a fever.  Your symptoms do not begin to resolve within 3 days. SEEK IMMEDIATE MEDICAL CARE IF:   You have severe back pain or lower abdominal pain.  You develop chills.  You have nausea or vomiting.  You have continued burning or discomfort with urination. MAKE SURE YOU:   Understand these instructions.  Will watch your condition.  Will get help right away if you are not doing well or get worse. Document Released: 10/21/2004 Document Revised: 07/13/2011 Document Reviewed: 02/19/2011 [facility] Patient Information 2015 [employer] [place]. This information is not intended to replace advice given to you by your health care provider. Make sure you discuss any questions you have with your health care provider.

## 2014-08-28 ENCOUNTER — Encounter (HOSPITAL_COMMUNITY): Payer: Self-pay | Admitting: Emergency Medicine

## 2014-08-28 ENCOUNTER — Emergency Department (HOSPITAL_COMMUNITY)
Admission: EM | Admit: 2014-08-28 | Discharge: 2014-08-29 | Disposition: A | Discharge status: Home / Self Care | Payer: Medicare HMO | Attending: Emergency Medicine | Admitting: Emergency Medicine

## 2014-08-28 DIAGNOSIS — M549 Dorsalgia, unspecified: Secondary | ICD-10-CM | POA: Diagnosis not present

## 2014-08-28 DIAGNOSIS — G43909 Migraine, unspecified, not intractable, without status migrainosus: Secondary | ICD-10-CM | POA: Insufficient documentation

## 2014-08-28 DIAGNOSIS — Z90710 Acquired absence of both cervix and uterus: Secondary | ICD-10-CM | POA: Diagnosis not present

## 2014-08-28 DIAGNOSIS — Z792 Long term (current) use of antibiotics: Secondary | ICD-10-CM | POA: Insufficient documentation

## 2014-08-28 DIAGNOSIS — E079 Disorder of thyroid, unspecified: Secondary | ICD-10-CM | POA: Diagnosis not present

## 2014-08-28 DIAGNOSIS — K2970 Gastritis, unspecified, without bleeding: Secondary | ICD-10-CM | POA: Diagnosis not present

## 2014-08-28 DIAGNOSIS — R1013 Epigastric pain: Secondary | ICD-10-CM | POA: Diagnosis present

## 2014-08-28 NOTE — ED Notes (Signed)
Pt states she is having upper abd pain and back pain that started about 2 hrs ago  Pt has nausea without vomiting

## 2014-08-29 ENCOUNTER — Emergency Department (HOSPITAL_COMMUNITY): Payer: Medicare HMO

## 2014-08-29 DIAGNOSIS — K2970 Gastritis, unspecified, without bleeding: Secondary | ICD-10-CM | POA: Diagnosis not present

## 2014-08-29 LAB — COMPREHENSIVE METABOLIC PANEL
ALBUMIN: 4 g/dL (ref 3.5–5.0)
ALT: 42 U/L (ref 14–54)
AST: 85 U/L — ABNORMAL HIGH (ref 15–41)
Alkaline Phosphatase: 55 U/L (ref 38–126)
Anion gap: 6 (ref 5–15)
BUN: 14 mg/dL (ref 6–20)
CALCIUM: 9.2 mg/dL (ref 8.9–10.3)
CO2: 21 mmol/L — ABNORMAL LOW (ref 22–32)
Chloride: 112 mmol/L — ABNORMAL HIGH (ref 101–111)
Creatinine, Ser: 0.87 mg/dL (ref 0.44–1.00)
GFR calc Af Amer: >60 mL/min (ref >60)
Glucose, Bld: 82 mg/dL (ref 65–99)
Potassium: 4.5 mmol/L (ref 3.5–5.1)
Sodium: 139 mmol/L (ref 135–145)
Total Bilirubin: 1.1 mg/dL (ref 0.3–1.2)
Total Protein: 7.6 g/dL (ref 6.5–8.1)

## 2014-08-29 LAB — CBC
HCT: 42.5 % (ref 36.0–46.0)
Hemoglobin: 13.3 g/dL (ref 12.0–15.0)
MCH: 30.2 pg (ref 26.0–34.0)
MCHC: 31.3 g/dL (ref 30.0–36.0)
MCV: 96.6 fL (ref 78.0–100.0)
Platelets: 183 10*3/uL (ref 150–400)
RBC: 4.4 MIL/uL (ref 3.87–5.11)
RDW: 12.9 % (ref 11.5–15.5)
WBC: 7.9 10*3/uL (ref 4.0–10.5)

## 2014-08-29 LAB — LIPASE, BLOOD: Lipase: 27 U/L (ref 22–51)

## 2014-08-29 MED ORDER — PANTOPRAZOLE SODIUM 40 MG IV SOLR
40.0000 mg | Freq: Once | INTRAVENOUS | Status: AC
  Administered 2014-08-29: 40 mg via INTRAVENOUS
  Filled 2014-08-29: qty 40

## 2014-08-29 MED ORDER — GI COCKTAIL ~~LOC~~
30.0000 mL | Freq: Once | ORAL | Status: AC
  Administered 2014-08-29: 30 mL via ORAL
  Filled 2014-08-29: qty 30

## 2014-08-29 MED ORDER — PANTOPRAZOLE SODIUM 20 MG PO TBEC: 20.0000 mg | DELAYED_RELEASE_TABLET | Freq: Every day | ORAL | Status: DC

## 2014-08-29 MED ORDER — METOCLOPRAMIDE HCL 5 MG/ML IJ SOLN
10.0000 mg | Freq: Once | INTRAMUSCULAR | Status: AC
  Administered 2014-08-29: 10 mg via INTRAVENOUS
  Filled 2014-08-29: qty 2

## 2014-08-29 MED ORDER — ONDANSETRON HCL 4 MG/2ML IJ SOLN
4.0000 mg | Freq: Once | INTRAMUSCULAR | Status: AC
  Administered 2014-08-29: 4 mg via INTRAVENOUS
  Filled 2014-08-29: qty 2

## 2014-08-29 NOTE — ED Notes (Signed)
Bed: WA06 Expected date:  Expected time:  Means of arrival:  Comments: 

## 2014-08-29 NOTE — Discharge Instructions (Signed)
Gastritis, Adult °Gastritis is soreness and puffiness (inflammation) of the lining of the stomach. If you do not get help, gastritis can cause bleeding and sores (ulcers) in the stomach. °HOME CARE  °· Only take medicine as told by your doctor. °· If you were given antibiotic medicines, take them as told. Finish the medicines even if you start to feel better. °· Drink enough fluids to keep your pee (urine) clear or pale yellow. °· Avoid foods and drinks that make your problems worse. Foods you may want to avoid include: °¨ Caffeine or alcohol. °¨ Chocolate. °¨ Mint. °¨ Garlic and onions. °¨ Spicy foods. °¨ Citrus fruits, including oranges, lemons, or limes. °¨ Food containing tomatoes, including sauce, chili, salsa, and pizza. °¨ Fried and fatty foods. °· Eat small meals throughout the day instead of large meals. °GET HELP RIGHT AWAY IF:  °· You have black or dark red poop (stools). °· You throw up (vomit) blood. It may look like coffee grounds. °· You cannot keep fluids down. °· Your belly (abdominal) pain gets worse. °· You have a fever. °· You do not feel better after 1 week. °· You have any other questions or concerns. °MAKE SURE YOU:  °· Understand these instructions. °· Will watch your condition. °· Will get help right away if you are not doing well or get worse. °Document Released: 06/30/2007 Document Revised: 04/05/2011 Document Reviewed: 02/24/2011 °[employer]® Patient Information ©2015 [employer]. This information is not intended to replace advice given to you by your health care provider. Make sure you discuss any questions you have with your health care provider. ° °

## 2014-08-29 NOTE — ED Provider Notes (Signed)
CSN: [id number]     Arrival date & time 08/28/14  2345 History   First MD Initiated Contact with Patient 08/29/14 0018     Chief Complaint  Patient presents with  . Abdominal Pain     (Consider location/radiation/quality/duration/timing/severity/associated sxs/prior Treatment) HPI Comments: Patient without significant other medical history presents with c/o epigastric abdominal pain for the past 3 hours. Nausea, no vomiting. No fever. No change in bowel movements. Pain radiates into the back. She has had pain like this before diagnosed as "gastritis".   Patient is a 44 y.o. female presenting with abdominal pain. The history is provided by the patient. No language interpreter was used.  Abdominal Pain Associated symptoms: chest pain and nausea   Associated symptoms: no chills, no diarrhea, no dysuria, no fever, no shortness of breath and no vomiting     Past Medical History  Diagnosis Date  . Thyroid disease   . Back pain   . Migraine    Past Surgical History  Procedure Laterality Date  . Back surgery      spinal fusion L4-L5-S1  . Abdominal hysterectomy    . Breast lumpectomy     Family History  Problem Relation Age of Onset  . Hypertension Mother   . Heart failure Father   . Cancer Sister   . Hypertension Sister   . Heart failure Other    History  Substance Use Topics  . Smoking status: Never Smoker   . Smokeless tobacco: Never Used  . Alcohol Use: No   OB History    No data available     Review of Systems  Constitutional: Negative for fever and chills.  Respiratory: Negative.  Negative for shortness of breath.   Cardiovascular: Positive for chest pain.  Gastrointestinal: Positive for nausea and abdominal pain. Negative for vomiting, diarrhea and blood in stool.  Genitourinary: Negative for dysuria.  Musculoskeletal: Positive for back pain.  Skin: Negative.   Neurological: Negative.       Allergies  Lyrica; Morphine and related; and Percocet  Home  Medications   Prior to Admission medications   Medication Sig Start Date End Date Taking? Authorizing Provider  acetaZOLAMIDE (DIAMOX) 250 MG tablet Take 250 mg by mouth 3 (three) times daily.    Historical Provider, MD  baclofen (LIORESAL) 10 MG tablet Take 10 mg by mouth 3 (three) times daily.    Historical Provider, MD  butalbital-acetaminophen-caffeine (FIORICET, ESGIC) 50-325-40 MG per tablet Take 1 tablet by mouth 2 (two) times daily as needed for headache or migraine.    Historical Provider, MD  celecoxib (CELEBREX) 100 MG capsule Take 100 mg by mouth 2 (two) times daily.    Historical Provider, MD  cephALEXin (KEFLEX) 500 MG capsule Take 1 capsule (500 mg total) by mouth 4 (four) times daily. 07/24/13   Cathren Laine, MD  estradiol (ESTRACE) 1 MG tablet Take 1.5 mg by mouth daily.    Historical Provider, MD  gabapentin (NEURONTIN) 800 MG tablet Take 3,200 mg by mouth at bedtime.    Historical Provider, MD  ketorolac (TORADOL) 10 MG tablet Take 10 mg by mouth every 6 (six) hours as needed for moderate pain (migraine).    Historical Provider, MD  levothyroxine (SYNTHROID, LEVOTHROID) 25 MCG tablet Take 25 mcg by mouth daily.    Historical Provider, MD  oxyCODONE-acetaminophen (PERCOCET) 10-325 MG per tablet Take 1 tablet by mouth every 8 (eight) hours as needed for pain.     Historical Provider, MD  oxymetazoline Vickie Epley NASAL  SPRAY) 0.05 % nasal spray Place 1 spray into both nostrils 2 (two) times daily. 01/21/13   Renne Crigler, PA-C  phenazopyridine (PYRIDIUM) 200 MG tablet Take 1 tablet (200 mg total) by mouth 3 (three) times daily. 07/24/13   Cathren Laine, MD  SUMAtriptan (IMITREX) 100 MG tablet Take 100 mg by mouth every 2 (two) hours as needed for migraine.     Historical Provider, MD   BP 127/72 mmHg  Pulse 59  Temp(Src) 97.5 F (36.4 C) (Oral)  Resp 18  Ht  (1.549 m)  Wt 149 lb 6 oz (67.756 kg)  BMI 28.24 kg/m2  SpO2 100% Physical Exam  Constitutional: She appears  well-developed and well-nourished.  HENT:  Head: Normocephalic.  Neck: Normal range of motion. Neck supple.  Cardiovascular: Normal rate and regular rhythm.   Pulmonary/Chest: Effort normal and breath sounds normal.  Abdominal: Soft. Bowel sounds are normal. There is tenderness. There is no rebound and no guarding.  Epigastric abdominal tenderness. No RUQ or LUQ tenderness. No distention. BS + x 4 quadrants.  Musculoskeletal: Normal range of motion.  Neurological: She is alert. No cranial nerve deficit.  Skin: Skin is warm and dry. No rash noted.  Psychiatric: She has a normal mood and affect.    ED Course  Procedures (including critical care time) Labs Review Labs Reviewed  CBC  LIPASE, BLOOD  COMPREHENSIVE METABOLIC PANEL    Imaging Review No results found.   EKG Interpretation None      MDM   Final diagnoses:  None    1. Gastritis  VSS, afebrile. Labs reassuring. She reports no improvement with Zofran and GI cocktail but feels better after Reglan. She is stable for discharge home with Protonix and PCP follow up .    Elpidio Anis, PA-C 08/29/14 0500  Marisa Severin, MD 08/29/14 [phone]

## 2014-09-08 ENCOUNTER — Encounter (HOSPITAL_COMMUNITY): Payer: Self-pay | Admitting: Emergency Medicine

## 2014-09-08 ENCOUNTER — Emergency Department (HOSPITAL_COMMUNITY)
Admission: EM | Admit: 2014-09-08 | Discharge: 2014-09-09 | Disposition: A | Discharge status: Home / Self Care | Payer: Medicare HMO | Attending: Emergency Medicine | Admitting: Emergency Medicine

## 2014-09-08 DIAGNOSIS — Z79899 Other long term (current) drug therapy: Secondary | ICD-10-CM | POA: Insufficient documentation

## 2014-09-08 DIAGNOSIS — G43909 Migraine, unspecified, not intractable, without status migrainosus: Secondary | ICD-10-CM | POA: Diagnosis not present

## 2014-09-08 DIAGNOSIS — N390 Urinary tract infection, site not specified: Secondary | ICD-10-CM | POA: Diagnosis not present

## 2014-09-08 DIAGNOSIS — R51 Headache: Secondary | ICD-10-CM

## 2014-09-08 DIAGNOSIS — E079 Disorder of thyroid, unspecified: Secondary | ICD-10-CM | POA: Insufficient documentation

## 2014-09-08 DIAGNOSIS — R519 Headache, unspecified: Secondary | ICD-10-CM

## 2014-09-08 NOTE — ED Notes (Signed)
Pt from home c/o headache x several days and dysuria. Pt reports taking ecxedrine without relief.

## 2014-09-09 DIAGNOSIS — G43909 Migraine, unspecified, not intractable, without status migrainosus: Secondary | ICD-10-CM | POA: Diagnosis not present

## 2014-09-09 LAB — URINALYSIS, ROUTINE W REFLEX MICROSCOPIC
BILIRUBIN URINE: NEGATIVE
Glucose, UA: NEGATIVE mg/dL
Ketones, ur: NEGATIVE mg/dL
Leukocytes, UA: NEGATIVE
Nitrite: NEGATIVE
PROTEIN: NEGATIVE mg/dL
Specific Gravity, Urine: 1.029 (ref 1.005–1.030)
UROBILINOGEN UA: 1 mg/dL (ref 0.0–1.0)
pH: 6.5 (ref 5.0–8.0)

## 2014-09-09 LAB — URINE MICROSCOPIC-ADD ON

## 2014-09-09 MED ORDER — MAGNESIUM SULFATE 2 GM/50ML IV SOLN
2.0000 g | Freq: Once | INTRAVENOUS | Status: AC
  Administered 2014-09-09: 2 g via INTRAVENOUS
  Filled 2014-09-09: qty 50

## 2014-09-09 MED ORDER — DIPHENHYDRAMINE HCL 50 MG/ML IJ SOLN
25.0000 mg | Freq: Once | INTRAMUSCULAR | Status: AC
  Administered 2014-09-09: 25 mg via INTRAVENOUS
  Filled 2014-09-09: qty 1

## 2014-09-09 MED ORDER — CEPHALEXIN 500 MG PO CAPS: 500.0000 mg | ORAL_CAPSULE | Freq: Three times a day (TID) | ORAL | Status: AC

## 2014-09-09 MED ORDER — METOCLOPRAMIDE HCL 5 MG/ML IJ SOLN
10.0000 mg | Freq: Once | INTRAMUSCULAR | Status: AC
  Administered 2014-09-09: 10 mg via INTRAVENOUS
  Filled 2014-09-09: qty 2

## 2014-09-09 MED ORDER — SODIUM CHLORIDE 0.9 % IV BOLUS (SEPSIS)
1000.0000 mL | Freq: Once | INTRAVENOUS | Status: AC
  Administered 2014-09-09: 1000 mL via INTRAVENOUS

## 2014-09-09 MED ORDER — KETOROLAC TROMETHAMINE 30 MG/ML IJ SOLN
30.0000 mg | Freq: Once | INTRAMUSCULAR | Status: AC
  Administered 2014-09-09: 30 mg via INTRAVENOUS
  Filled 2014-09-09: qty 1

## 2014-09-09 NOTE — ED Provider Notes (Signed)
CSN: [id number]     Arrival date & time 09/08/14  2338 History  This chart was scribed for Alexandria Albe, MD by Doreatha Martin, ED Scribe. This patient was seen in room WA12/WA12 and the patient's care was started at 1:16 AM.     Chief Complaint  Patient presents with  . Headache  . Dysuria   The history is provided by the patient. No language interpreter was used.    HPI Comments: Alexandria Hobbs is a 44 y.o. female who presents to the Emergency Department complaining of moderate generalized, pressure, throbbing, tight, HA, worst in the back, onset 2 days ago. She states associated dysuria, vaginal discharge, frequency, malodorous urine, Rt sided lateral/flank abdominal pain. She states Hx of migraines several times a month and describes this HA as similar. She normally takes Imitrex and Toradol, but has not taken any for this episode because she has not refilled her medications. Pt was seen in the ED on 08/29/14 for epigastric abdominal pain with a Dx of gastritis. Non-smoker. Occasional drinker. She is on disability for chronic back pain. She denies nausea, vomiting, fever, blurred vision, numbness or tingling or her extremities, hematuria, photophobia, phonophobia.   PCP with [facility] Dr. Alberteen Sam.  No neurologist.  She is seen by pain management at [facility].   Past Medical History  Diagnosis Date  . Thyroid disease   . Back pain   . Migraine    Past Surgical History  Procedure Laterality Date  . Back surgery      spinal fusion L4-L5-S1  . Abdominal hysterectomy    . Breast lumpectomy     Family History  Problem Relation Age of Onset  . Hypertension Mother   . Heart failure Father   . Cancer Sister   . Hypertension Sister   . Heart failure Other    Social History  Substance Use Topics  . Smoking status: Never Smoker   . Smokeless tobacco: Never Used  . Alcohol Use: No   On disability for chronic back pain  OB History    No data available     Review of Systems   Constitutional: Negative for fever.  Eyes: Negative for photophobia and visual disturbance.  Gastrointestinal: Positive for abdominal pain. Negative for nausea and vomiting.  Genitourinary: Positive for dysuria, frequency and vaginal discharge. Negative for hematuria.  Neurological: Positive for headaches. Negative for numbness.  All other systems reviewed and are negative.  Allergies  Boric acid; Lyrica; Morphine and related; and Percocet  Home Medications   Prior to Admission medications   Medication Sig Start Date End Date Taking? Authorizing Provider  acetaZOLAMIDE (DIAMOX) 250 MG tablet Take 250 mg by mouth 3 (three) times daily.    Historical Provider, MD  baclofen (LIORESAL) 10 MG tablet Take 10 mg by mouth 3 (three) times daily.    Historical Provider, MD  butalbital-acetaminophen-caffeine (FIORICET, ESGIC) 50-325-40 MG per tablet Take 1 tablet by mouth 2 (two) times daily as needed for headache or migraine.    Historical Provider, MD  celecoxib (CELEBREX) 100 MG capsule Take 100 mg by mouth 2 (two) times daily.    Historical Provider, MD  cephALEXin (KEFLEX) 500 MG capsule Take 1 capsule (500 mg total) by mouth 4 (four) times daily. 07/24/13   Cathren Laine, MD  estradiol (ESTRACE) 1 MG tablet Take 1.5 mg by mouth daily.    Historical Provider, MD  gabapentin (NEURONTIN) 800 MG tablet Take 3,200 mg by mouth at bedtime.  Historical Provider, MD  ketorolac (TORADOL) 10 MG tablet Take 10 mg by mouth every 6 (six) hours as needed for moderate pain (migraine).    Historical Provider, MD  levothyroxine (SYNTHROID, LEVOTHROID) 25 MCG tablet Take 25 mcg by mouth daily.    Historical Provider, MD  oxyCODONE-acetaminophen (PERCOCET) 10-325 MG per tablet Take 1 tablet by mouth every 8 (eight) hours as needed for pain.     Historical Provider, MD  oxymetazoline (AFRIN NASAL SPRAY) 0.05 % nasal spray Place 1 spray into both nostrils 2 (two) times daily. 01/21/13   Renne Crigler, PA-C   pantoprazole (PROTONIX) 20 MG tablet Take 1 tablet (20 mg total) by mouth daily. 08/29/14   Elpidio Anis, PA-C  phenazopyridine (PYRIDIUM) 200 MG tablet Take 1 tablet (200 mg total) by mouth 3 (three) times daily. 07/24/13   Cathren Laine, MD  SUMAtriptan (IMITREX) 100 MG tablet Take 100 mg by mouth every 2 (two) hours as needed for migraine.     Historical Provider, MD   BP 124/80 mmHg  Pulse 61  Temp(Src) 97.8 F (36.6 C) (Oral)  Resp 18  SpO2 100%  Vital signs normal   Physical Exam  Constitutional: She is oriented to person, place, and time. She appears well-developed and well-nourished.  Non-toxic appearance. She does not appear ill. No distress.  HENT:  Head: Normocephalic and atraumatic.  Right Ear: External ear normal.  Left Ear: External ear normal.  Nose: Nose normal. No mucosal edema or rhinorrhea.  Mouth/Throat: Oropharynx is clear and moist and mucous membranes are normal. No dental abscesses or uvula swelling.  Eyes: Conjunctivae and EOM are normal. Pupils are equal, round, and reactive to light.  Neck: Normal range of motion and full passive range of motion without pain. Neck supple.  Cardiovascular: Normal rate, regular rhythm and normal heart sounds.  Exam reveals no gallop and no friction rub.   No murmur heard. Pulmonary/Chest: Effort normal and breath sounds normal. No respiratory distress. She has no wheezes. She has no rhonchi. She has no rales. She exhibits no tenderness and no crepitus.  Abdominal: Soft. Normal appearance and bowel sounds are normal. She exhibits no distension. There is tenderness. There is no rebound and no guarding.  TTP over the suprapubic area.   Musculoskeletal: Normal range of motion. She exhibits no edema or tenderness.  Moves all extremities well.   Neurological: She is alert and oriented to person, place, and time. She has normal strength. No cranial nerve deficit.  Skin: Skin is warm, dry and intact. No rash noted. No erythema. No  pallor.  Psychiatric: Her speech is normal and behavior is normal. Her mood appears not anxious.  Flat affect.   Nursing note and vitals reviewed.   ED Course  Procedures (including critical care time)  Medications  sodium chloride 0.9 % bolus 1,000 mL (1,000 mLs Intravenous New Bag/Given 09/09/14 0135)  metoCLOPramide (REGLAN) injection 10 mg (10 mg Intravenous Given 09/09/14 0139)  diphenhydrAMINE (BENADRYL) injection 25 mg (25 mg Intravenous Given 09/09/14 0136)  ketorolac (TORADOL) 30 MG/ML injection 30 mg (30 mg Intravenous Given 09/09/14 0135)  magnesium sulfate IVPB 2 g 50 mL (2 g Intravenous New Bag/Given 09/09/14 0410)    DIAGNOSTIC STUDIES: Oxygen Saturation is 100% on RA, normal by my interpretation.    COORDINATION OF CARE: 1:22 AM Discussed treatment plan with pt at bedside and pt agreed to plan.   Review of the [place] so she gets Percocet on a monthly basis, she receives #  150 Percocet 10/325 monthly, the last was August 8.  3:46 AM Snoring loudly and very hard to wake up. Immediately states that her head is hurting.  05:40 pt states her headache is better and is ready to be discharged.   Labs Review Results for orders placed or performed during the hospital encounter of 09/08/14  Urinalysis, Routine w reflex microscopic-may I&O cath if menses (not at [facility])  Result Value Ref Range   Color, Urine YELLOW YELLOW   APPearance CLOUDY (A) CLEAR   Specific Gravity, Urine 1.029 1.005 - 1.030   pH 6.5 5.0 - 8.0   Glucose, UA NEGATIVE NEGATIVE mg/dL   Hgb urine dipstick MODERATE (A) NEGATIVE   Bilirubin Urine NEGATIVE NEGATIVE   Ketones, ur NEGATIVE NEGATIVE mg/dL   Protein, ur NEGATIVE NEGATIVE mg/dL   Urobilinogen, UA 1.0 0.0 - 1.0 mg/dL   Nitrite NEGATIVE NEGATIVE   Leukocytes, UA NEGATIVE NEGATIVE  Urine microscopic-add on  Result Value Ref Range   Squamous Epithelial / LPF RARE RARE   RBC / HPF 3-6 <3 RBC/hpf   Bacteria, UA MANY (A) RARE    Urine-Other MUCOUS PRESENT     Laboratory interpretation all normal except possible UTI  Imaging Review No results found. Dg Abd Acute W/chest  08/29/2014   CLINICAL DATA:  Sudden onset of epigastric and chest pain.  Nausea.  EXAM: DG ABDOMEN ACUTE W/ 1V CHEST  COMPARISON:  04/21/2013  FINDINGS: The abdominal gas pattern is negative for obstruction or perforation. No biliary or urinary calculi are evident. There is streaky opacity in the left lung base which could represent early lower lobe infiltrate.  IMPRESSION: Unremarkable abdominal gas pattern. Question developing infiltrate in the left lung base.   Electronically Signed   By: Ellery Plunk M.D.   On: 08/29/2014 02:33    EKG Interpretation None      MDM   Final diagnoses:  Nonintractable headache  Urinary tract infection without hematuria, site unspecified    New Prescriptions   CEPHALEXIN (KEFLEX) 500 MG CAPSULE    Take 1 capsule (500 mg total) by mouth 3 (three) times daily.    Plan discharge  Alexandria Albe, MD, FACEP   I personally performed the services described in this documentation, which was scribed in my presence. The recorded information has been reviewed and considered.  Alexandria Albe, MD, Concha Pyo, MD 09/09/14 [phone]

## 2014-09-09 NOTE — Discharge Instructions (Signed)
Drink plenty of fluids. Get your migraine medications filled so you will have them if you need them. Take the antibiotic until gone.  Recheck if you get worse such as fever, vomiting, pain in your sides.

## 2014-12-10 ENCOUNTER — Encounter (HOSPITAL_COMMUNITY): Payer: Self-pay

## 2014-12-10 ENCOUNTER — Emergency Department (HOSPITAL_COMMUNITY)
Admission: EM | Admit: 2014-12-10 | Discharge: 2014-12-10 | Disposition: A | Discharge status: Home / Self Care | Payer: Medicare HMO | Attending: Emergency Medicine | Admitting: Emergency Medicine

## 2014-12-10 ENCOUNTER — Emergency Department (HOSPITAL_COMMUNITY): Payer: Medicare HMO

## 2014-12-10 DIAGNOSIS — J069 Acute upper respiratory infection, unspecified: Secondary | ICD-10-CM | POA: Insufficient documentation

## 2014-12-10 DIAGNOSIS — E079 Disorder of thyroid, unspecified: Secondary | ICD-10-CM | POA: Insufficient documentation

## 2014-12-10 DIAGNOSIS — Z792 Long term (current) use of antibiotics: Secondary | ICD-10-CM | POA: Insufficient documentation

## 2014-12-10 DIAGNOSIS — Z79899 Other long term (current) drug therapy: Secondary | ICD-10-CM | POA: Insufficient documentation

## 2014-12-10 DIAGNOSIS — M549 Dorsalgia, unspecified: Secondary | ICD-10-CM | POA: Insufficient documentation

## 2014-12-10 DIAGNOSIS — G43909 Migraine, unspecified, not intractable, without status migrainosus: Secondary | ICD-10-CM | POA: Diagnosis not present

## 2014-12-10 DIAGNOSIS — Z791 Long term (current) use of non-steroidal anti-inflammatories (NSAID): Secondary | ICD-10-CM | POA: Diagnosis not present

## 2014-12-10 DIAGNOSIS — R079 Chest pain, unspecified: Secondary | ICD-10-CM | POA: Diagnosis not present

## 2014-12-10 DIAGNOSIS — R05 Cough: Secondary | ICD-10-CM | POA: Diagnosis present

## 2014-12-10 LAB — RAPID STREP SCREEN (MED CTR MEBANE ONLY): STREPTOCOCCUS, GROUP A SCREEN (DIRECT): NEGATIVE

## 2014-12-10 MED ORDER — IBUPROFEN 600 MG PO TABS: 600.0000 mg | ORAL_TABLET | Freq: Four times a day (QID) | ORAL | Status: AC | PRN

## 2014-12-10 MED ORDER — ACETAMINOPHEN 500 MG PO TABS: 500.0000 mg | ORAL_TABLET | Freq: Four times a day (QID) | ORAL | Status: AC | PRN

## 2014-12-10 MED ORDER — HYDROCOD POLST-CPM POLST ER 10-8 MG/5ML PO SUER: 5.0000 mL | Freq: Every evening | ORAL | Status: AC | PRN

## 2014-12-10 MED ORDER — IBUPROFEN 800 MG PO TABS
800.0000 mg | ORAL_TABLET | Freq: Once | ORAL | Status: AC
  Administered 2014-12-10: 800 mg via ORAL
  Filled 2014-12-10: qty 1

## 2014-12-10 MED ORDER — BENZONATATE 100 MG PO CAPS
100.0000 mg | ORAL_CAPSULE | Freq: Once | ORAL | Status: AC
Start: 2014-12-10 — End: 2014-12-10
  Administered 2014-12-10: 100 mg via ORAL
  Filled 2014-12-10: qty 1

## 2014-12-10 NOTE — ED Provider Notes (Signed)
CSN: [id number]     Arrival date & time 12/10/14  1016 History   First MD Initiated Contact with Patient 12/10/14 1046     Chief Complaint  Patient presents with  . Cough  . Sore Throat    HPI   Alexandria Hobbs is a 44 y.o. female with a PMH of back pain and migraines who presents to the ED with nasal congestion, sore throat, and nonproductive cough x 1 week. She denies exacerbating factors, though states she experiences chest pain and back pain with coughing. She has tried OTC cold and cough medication without significant symptom relief. She reports associated chills and mild headache. She denies lightheadedness, dizziness, shortness of breath, abdominal pain, N/V, history of malignancy, bowel or bladder incontinence, saddle anesthesia, weakness, IVDU, anticoagulant use.    Past Medical History  Diagnosis Date  . Thyroid disease   . Back pain   . Migraine    Past Surgical History  Procedure Laterality Date  . Back surgery      spinal fusion L4-L5-S1  . Abdominal hysterectomy    . Breast lumpectomy     Family History  Problem Relation Age of Onset  . Hypertension Mother   . Heart failure Father   . Cancer Sister   . Hypertension Sister   . Heart failure Other    Social History  Substance Use Topics  . Smoking status: Never Smoker   . Smokeless tobacco: Never Used  . Alcohol Use: No   OB History    No data available      Review of Systems  Constitutional: Positive for chills. Negative for fever.  HENT: Positive for congestion.   Respiratory: Positive for cough. Negative for shortness of breath.   Cardiovascular: Positive for chest pain.  Gastrointestinal: Negative for nausea, vomiting and abdominal pain.  Musculoskeletal: Positive for back pain.  Neurological: Positive for headaches. Negative for dizziness and light-headedness.      Allergies  Boric acid; Lyrica; Morphine and related; and Percocet  Home Medications   Prior to Admission medications    Medication Sig Start Date End Date Taking? Authorizing Provider  acetaminophen (TYLENOL) 500 MG tablet Take 1 tablet (500 mg total) by mouth every 6 (six) hours as needed. 12/10/14   Mady Gemma, PA-C  acetaZOLAMIDE (DIAMOX) 250 MG tablet Take 250 mg by mouth 3 (three) times daily.    Historical Provider, MD  baclofen (LIORESAL) 10 MG tablet Take 10 mg by mouth 3 (three) times daily.    Historical Provider, MD  butalbital-acetaminophen-caffeine (FIORICET, ESGIC) 50-325-40 MG per tablet Take 1 tablet by mouth 2 (two) times daily as needed for headache or migraine.    Historical Provider, MD  celecoxib (CELEBREX) 100 MG capsule Take 100 mg by mouth 2 (two) times daily.    Historical Provider, MD  cephALEXin (KEFLEX) 500 MG capsule Take 1 capsule (500 mg total) by mouth 3 (three) times daily. 09/09/14   [name], MD  chlorpheniramine-HYDROcodone (TUSSIONEX PENNKINETIC ER) 10-8 MG/5ML SUER Take 5 mLs by mouth at bedtime as needed for cough. 12/10/14   Mady Gemma, PA-C  estradiol (ESTRACE) 1 MG tablet Take 1.5 mg by mouth daily.    Historical Provider, MD  gabapentin (NEURONTIN) 800 MG tablet Take 3,200 mg by mouth at bedtime.    Historical Provider, MD  ibuprofen (ADVIL,MOTRIN) 600 MG tablet Take 1 tablet (600 mg total) by mouth every 6 (six) hours as needed. 12/10/14   Mady Gemma, PA-C  ketorolac (  TORADOL) 10 MG tablet Take 10 mg by mouth every 6 (six) hours as needed for moderate pain (migraine).    Historical Provider, MD  levothyroxine (SYNTHROID, LEVOTHROID) 25 MCG tablet Take 25 mcg by mouth daily.    Historical Provider, MD  oxyCODONE-acetaminophen (PERCOCET) 10-325 MG per tablet Take 1 tablet by mouth every 8 (eight) hours as needed for pain.     Historical Provider, MD  oxymetazoline (AFRIN NASAL SPRAY) 0.05 % nasal spray Place 1 spray into both nostrils 2 (two) times daily. Patient taking differently: Place 1 spray into both nostrils 2 (two) times daily as needed  for congestion.  01/21/13   Renne CriglerJoshua Geiple, PA-C  pantoprazole (PROTONIX) 20 MG tablet Take 1 tablet (20 mg total) by mouth daily. 08/29/14   Elpidio AnisShari Upstill, PA-C  phenazopyridine (PYRIDIUM) 200 MG tablet Take 1 tablet (200 mg total) by mouth 3 (three) times daily. Patient not taking: Reported on 09/09/2014 07/24/13   Cathren LaineKevin Steinl, MD  SUMAtriptan (IMITREX) 100 MG tablet Take 100 mg by mouth every 2 (two) hours as needed for migraine.     Historical Provider, MD    BP 124/68 mmHg  Pulse 66  Temp(Src) 98.3 F (36.8 C) (Oral)  Resp 16  SpO2 99% Physical Exam  Constitutional: She is oriented to person, place, and time. She appears well-developed and well-nourished. No distress.  HENT:  Head: Normocephalic and atraumatic.  Right Ear: External ear normal.  Left Ear: External ear normal.  Nose: Nose normal.  Mouth/Throat: Oropharynx is clear and moist. No oropharyngeal exudate.  Eyes: Conjunctivae and EOM are normal. Pupils are equal, round, and reactive to light. Right eye exhibits no discharge. Left eye exhibits no discharge. No scleral icterus.  Neck: Normal range of motion. Neck supple.  Cardiovascular: Normal rate, regular rhythm, normal heart sounds and intact distal pulses.   Pulmonary/Chest: Effort normal and breath sounds normal. No respiratory distress. She has no wheezes. She has no rales. She exhibits tenderness.  Mild TTP of anterior chest wall.  Abdominal: Soft. Bowel sounds are normal. She exhibits no distension and no mass. There is no tenderness. There is no rebound and no guarding.  Musculoskeletal: Normal range of motion. She exhibits tenderness. She exhibits no edema.  Mild diffuse TTP of thoracic paraspinal muscles. No focal midline tenderness, step-off, or deformity.  Neurological: She is alert and oriented to person, place, and time. She has normal strength. No cranial nerve deficit or sensory deficit.  Skin: Skin is warm and dry. She is not diaphoretic.  Psychiatric: She  has a normal mood and affect. Her behavior is normal.  Nursing note and vitals reviewed.   ED Course  Procedures (including critical care time)  Labs Review Labs Reviewed  [facility])  CULTURE, GROUP A STREP    Imaging Review Dg Chest 2 View  12/10/2014  CLINICAL DATA:  Central chest and upper back pain for several days, cough, shortness of breath, feels hot, question fever EXAM: CHEST  2 VIEW COMPARISON:  08/29/2014 FINDINGS: Normal heart size, mediastinal contours, and pulmonary vascularity. Lungs clear. No pneumothorax. Bones unremarkable. IMPRESSION: Normal exam. Electronically Signed   By: Ulyses SouthwardMark  Boles M.D.   On: 12/10/2014 12:08     I have personally reviewed and evaluated these images and lab results as part of my medical decision-making.   EKG Interpretation None      MDM   Final diagnoses:  URI (upper respiratory infection)    44 year old female presents with nasal  congestion, sore throat, and nonproductive cough x 1 week. Reports chest pain and back pain with coughing as well as chills and mild headache. States she has a history of migraines, and her headache is not as severe. Denies lightheadedness, dizziness, shortness of breath, abdominal pain, N/V, history of malignancy, bowel or bladder incontinence, saddle anesthesia, weakness, IVDU, anticoagulant use.   Patient is afebrile. Vital signs stable. Heart regular rate and rhythm. Lungs clear to auscultation bilaterally. Mild TTP of anterior chest wall, patient reports this reproduces her chest pain with coughing. Abdomen soft, nontender, nondistended. No lower extremity edema. Mild diffuse TTP of thoracic paraspinal muscles. No focal midline tenderness, step-off, or deformity. Normal neuro exam with no deficit.  Will obtain CXR and rapid strep. Patient given ibuprofen and tessalon for symptom relief.  Chest x-ray negative for acute cardiopulmonary disease. Rapid strep negative.  Symptoms most  likely viral. Patient is well-appearing and nontoxic. Feel she is stable for discharge at this time. Will treat with cough medicine. Advised patient can alternate taking ibuprofen and tylenol for pain. Also recommended trying warm honey, tea, and throat lozenges for additional symptom relief. Return precautions discussed. Patient to follow up PCP this week. Patient verbalizes her understanding and is in agreement with pain.  BP 124/68 mmHg  Pulse 66  Temp(Src) 98.3 F (36.8 C) (Oral)  Resp 16  SpO2 99%     Mady Gemma, PA-C 12/10/14 1240  Lorre Nick, MD 12/13/14 [phone]

## 2014-12-10 NOTE — Discharge Instructions (Signed)
1. Medications: alternate taking ibuprofen and tylenol, take cough medicine as needed at night, usual home medications 2. Treatment: rest, drink plenty of fluids; you can try warm honey, tea, throat lozenges for additional symptom relief 3. Follow Up: please followup with your primary doctor this week for discussion of your diagnoses and further evaluation after today's visit; please return to the ER for high fever, severe pain, new or worsening symptoms   Upper Respiratory Infection, Adult Most upper respiratory infections (URIs) are a viral infection of the air passages leading to the lungs. A URI affects the nose, throat, and upper air passages. The most common type of URI is nasopharyngitis and is typically referred to as "the common cold." URIs run their course and usually go away on their own. Most of the time, a URI does not require medical attention, but sometimes a bacterial infection in the upper airways can follow a viral infection. This is called a secondary infection. Sinus and middle ear infections are common types of secondary upper respiratory infections. Bacterial pneumonia can also complicate a URI. A URI can worsen asthma and chronic obstructive pulmonary disease (COPD). Sometimes, these complications can require emergency medical care and may be life threatening.  CAUSES Almost all URIs are caused by viruses. A virus is a type of germ and can spread from one person to another.  RISKS FACTORS You may be at risk for a URI if:   You smoke.   You have chronic heart or lung disease.  You have a weakened defense (immune) system.   You are very young or very old.   You have nasal allergies or asthma.  You work in crowded or poorly ventilated areas.  You work in health care facilities or schools. SIGNS AND SYMPTOMS  Symptoms typically develop 2-3 days after you come in contact with a cold virus. Most viral URIs last 7-10 days. However, viral URIs from the influenza virus  (flu virus) can last 14-18 days and are typically more severe. Symptoms may include:   Runny or stuffy (congested) nose.   Sneezing.   Cough.   Sore throat.   Headache.   Fatigue.   Fever.   Loss of appetite.   Pain in your forehead, behind your eyes, and over your cheekbones (sinus pain).  Muscle aches.  DIAGNOSIS  Your health care provider may diagnose a URI by:  Physical exam.  Tests to check that your symptoms are not due to another condition such as:  Strep throat.  Sinusitis.  Pneumonia.  Asthma. TREATMENT  A URI goes away on its own with time. It cannot be cured with medicines, but medicines may be prescribed or recommended to relieve symptoms. Medicines may help:  Reduce your fever.  Reduce your cough.  Relieve nasal congestion. HOME CARE INSTRUCTIONS   Take medicines only as directed by your health care provider.   Gargle warm saltwater or take cough drops to comfort your throat as directed by your health care provider.  Use a warm mist humidifier or inhale steam from a shower to increase air moisture. This may make it easier to breathe.  Drink enough fluid to keep your urine clear or pale yellow.   Eat soups and other clear broths and maintain good nutrition.   Rest as needed.   Return to work when your temperature has returned to normal or as your health care provider advises. You may need to stay home longer to avoid infecting others. You can also use a face mask  and careful hand washing to prevent spread of the virus.  Increase the usage of your inhaler if you have asthma.   Do not use any tobacco products, including cigarettes, chewing tobacco, or electronic cigarettes. If you need help quitting, ask your health care provider. PREVENTION  The best way to protect yourself from getting a cold is to practice good hygiene.   Avoid oral or hand contact with people with cold symptoms.   Wash your hands often if contact occurs.   There is no clear evidence that vitamin C, vitamin E, echinacea, or exercise reduces the chance of developing a cold. However, it is always recommended to get plenty of rest, exercise, and practice good nutrition.  SEEK MEDICAL CARE IF:   You are getting worse rather than better.   Your symptoms are not controlled by medicine.   You have chills.  You have worsening shortness of breath.  You have brown or red mucus.  You have yellow or brown nasal discharge.  You have pain in your face, especially when you bend forward.  You have a fever.  You have swollen neck glands.  You have pain while swallowing.  You have white areas in the back of your throat. SEEK IMMEDIATE MEDICAL CARE IF:   You have severe or persistent:  Headache.  Ear pain.  Sinus pain.  Chest pain.  You have chronic lung disease and any of the following:  Wheezing.  Prolonged cough.  Coughing up blood.  A change in your usual mucus.  You have a stiff neck.  You have changes in your:  Vision.  Hearing.  Thinking.  Mood. MAKE SURE YOU:   Understand these instructions.  Will watch your condition.  Will get help right away if you are not doing well or get worse.   This information is not intended to replace advice given to you by your health care provider. Make sure you discuss any questions you have with your health care provider.   Document Released: 07/07/2000 Document Revised: 05/28/2014 Document Reviewed: 04/18/2013 Elsevier Interactive Patient Education [employer]

## 2014-12-10 NOTE — ED Notes (Signed)
Patient c/o non productive cough, sore throat, and soreness of the chest, back and abdomen from coughing x 5 days.

## 2014-12-10 NOTE — Progress Notes (Signed)
Confirms pcp is Birdena JubileeRobyn Zanard [address] [place] [id number] [phone] EPIC updated

## 2014-12-11 LAB — CULTURE, GROUP A STREP

## 2015-05-13 ENCOUNTER — Encounter (HOSPITAL_COMMUNITY): Payer: Self-pay

## 2015-05-13 ENCOUNTER — Emergency Department (HOSPITAL_COMMUNITY)
Admission: EM | Admit: 2015-05-13 | Discharge: 2015-05-13 | Disposition: A | Discharge status: Home / Self Care | Payer: Medicare HMO | Attending: Emergency Medicine | Admitting: Emergency Medicine

## 2015-05-13 DIAGNOSIS — K002 Abnormalities of size and form of teeth: Secondary | ICD-10-CM | POA: Insufficient documentation

## 2015-05-13 DIAGNOSIS — Z793 Long term (current) use of hormonal contraceptives: Secondary | ICD-10-CM | POA: Insufficient documentation

## 2015-05-13 DIAGNOSIS — G8929 Other chronic pain: Secondary | ICD-10-CM

## 2015-05-13 DIAGNOSIS — K047 Periapical abscess without sinus: Secondary | ICD-10-CM | POA: Insufficient documentation

## 2015-05-13 DIAGNOSIS — M549 Dorsalgia, unspecified: Secondary | ICD-10-CM

## 2015-05-13 DIAGNOSIS — K0889 Other specified disorders of teeth and supporting structures: Secondary | ICD-10-CM | POA: Diagnosis not present

## 2015-05-13 DIAGNOSIS — Z79899 Other long term (current) drug therapy: Secondary | ICD-10-CM | POA: Diagnosis not present

## 2015-05-13 DIAGNOSIS — M545 Low back pain: Secondary | ICD-10-CM | POA: Insufficient documentation

## 2015-05-13 DIAGNOSIS — G43909 Migraine, unspecified, not intractable, without status migrainosus: Secondary | ICD-10-CM | POA: Diagnosis not present

## 2015-05-13 DIAGNOSIS — K029 Dental caries, unspecified: Secondary | ICD-10-CM | POA: Diagnosis not present

## 2015-05-13 DIAGNOSIS — Z8639 Personal history of other endocrine, nutritional and metabolic disease: Secondary | ICD-10-CM | POA: Insufficient documentation

## 2015-05-13 MED ORDER — PENICILLIN V POTASSIUM 500 MG PO TABS: 500.0000 mg | ORAL_TABLET | Freq: Four times a day (QID) | ORAL | Status: AC

## 2015-05-13 MED ORDER — OXYCODONE-ACETAMINOPHEN 5-325 MG PO TABS
1.0000 | ORAL_TABLET | Freq: Once | ORAL | Status: AC
  Administered 2015-05-13: 1 via ORAL
  Filled 2015-05-13: qty 1

## 2015-05-13 MED ORDER — KETOROLAC TROMETHAMINE 60 MG/2ML IM SOLN
60.0000 mg | Freq: Once | INTRAMUSCULAR | Status: AC
  Administered 2015-05-13: 60 mg via INTRAMUSCULAR
  Filled 2015-05-13: qty 2

## 2015-05-13 NOTE — ED Provider Notes (Signed)
CSN: [id number]     Arrival date & time 05/13/15  6283 History   First MD Initiated Contact with Patient 05/13/15 0900     Chief Complaint  Patient presents with  . Dental Pain  . Back Pain     (Consider location/radiation/quality/duration/timing/severity/associated sxs/prior Treatment) HPI   Alexandria Hobbs is a 45 year old female with a past medical history of chronic back pain, migraines who presents to the ED today complaining of dental pain and back pain. Patient states she has had intermittent right lower dental pain for the last 2 months that got significantly worse the last week. Patient states she is unable to chew or sleep due to the pain but it is now causing her to have a headache. She states she has been sleeping funny due to the pain in her tooth which caused her to tweak her back. She is now having significant left lower back pain radiating down her left leg. Patient has chronic back pain secondary to back surgery several years ago. She is followed by pain management for this. She has been taking home Percocets and muscle relaxers without relief of her symptoms. Patient is ambulatory but states is painful. Denies saddle anesthesia, bowel or bladder incontinence, fevers, chills, dysphagia, difficulty breathing, facial swelling, fevers, chills.  Past Medical History  Diagnosis Date  . Thyroid disease   . Back pain   . Migraine    Past Surgical History  Procedure Laterality Date  . Back surgery      spinal fusion L4-L5-S1  . Abdominal hysterectomy    . Breast lumpectomy     Family History  Problem Relation Age of Onset  . Hypertension Mother   . Heart failure Father   . Cancer Sister   . Hypertension Sister   . Heart failure Other    Social History  Substance Use Topics  . Smoking status: Never Smoker   . Smokeless tobacco: Never Used  . Alcohol Use: No   OB History    No data available     Review of Systems  All other systems reviewed and are  negative.     Allergies  Boric acid; Lyrica; Morphine and related; and Oxycodone-acetaminophen  Home Medications   Prior to Admission medications   Medication Sig Start Date End Date Taking? Authorizing Provider  baclofen (LIORESAL) 10 MG tablet Take 10 mg by mouth 3 (three) times daily.   Yes Historical Provider, MD  butalbital-acetaminophen-caffeine (FIORICET, ESGIC) 50-325-40 MG per tablet Take 1 tablet by mouth 2 (two) times daily as needed for headache or migraine.   Yes Historical Provider, MD  celecoxib (CELEBREX) 200 MG capsule Take 200 mg by mouth daily. 03/24/15  Yes Historical Provider, MD  cetirizine-pseudoephedrine (ZYRTEC-D) 5-120 MG tablet Take 1 tablet by mouth 2 (two) times daily.   Yes Historical Provider, MD  desonide (DESOWEN) 0.05 % cream Apply 1 application topically 2 (two) times daily. 03/04/15  Yes Historical Provider, MD  ESTRACE VAGINAL 0.1 MG/GM vaginal cream Place 1 Applicatorful vaginally 3 (three) times a week.  03/12/15  Yes Historical Provider, MD  estradiol (CLIMARA - DOSED IN MG/24 HR) 0.025 mg/24hr patch Place 1 patch onto the skin every Tuesday. 03/22/15  Yes Historical Provider, MD  gabapentin (NEURONTIN) 800 MG tablet Take 3,200 mg by mouth at bedtime.   Yes Historical Provider, MD  oxymetazoline (AFRIN NASAL SPRAY) 0.05 % nasal spray Place 1 spray into both nostrils 2 (two) times daily. Patient taking differently: Place 1 spray into both  nostrils 2 (two) times daily as needed for congestion.  01/21/13  Yes Renne CriglerJoshua Geiple, PA-C  pantoprazole (PROTONIX) 40 MG tablet Take 40 mg by mouth daily.   Yes Historical Provider, MD  PERCOCET 10-325 MG tablet Take 1 tablet by mouth 5 (five) times daily as needed for pain.  04/27/15  Yes Historical Provider, MD  progesterone (PROMETRIUM) 100 MG capsule Take 100 mg by mouth daily. 03/24/15  Yes Historical Provider, MD  SUMAtriptan (IMITREX) 100 MG tablet Take 100 mg by mouth every 2 (two) hours as needed for migraine.    Yes  Historical Provider, MD  VAGIFEM 10 MCG TABS vaginal tablet Place 1 tablet vaginally daily.  03/19/15  Yes Historical Provider, MD  acetaminophen (TYLENOL) 500 MG tablet Take 1 tablet (500 mg total) by mouth every 6 (six) hours as needed. Patient not taking: Reported on 05/13/2015 12/10/14   Mady GemmaElizabeth C Westfall, PA-C  cephALEXin (KEFLEX) 500 MG capsule Take 1 capsule (500 mg total) by mouth 3 (three) times daily. Patient not taking: Reported on 05/13/2015 09/09/14   Devoria AlbeIva Knapp, MD  chlorpheniramine-HYDROcodone [facility](TUSSIONEX PENNKINETIC ER) 10-8 MG/5ML SUER Take 5 mLs by mouth at bedtime as needed for cough. Patient not taking: Reported on 05/13/2015 12/10/14   Mady GemmaElizabeth C Westfall, PA-C  ibuprofen (ADVIL,MOTRIN) 600 MG tablet Take 1 tablet (600 mg total) by mouth every 6 (six) hours as needed. Patient not taking: Reported on 05/13/2015 12/10/14   Mady GemmaElizabeth C Westfall, PA-C   BP 124/85 mmHg  Pulse 89  Temp(Src) 98.5 F (36.9 C) (Oral)  Resp 18  SpO2 100% Physical Exam  Constitutional: She is oriented to person, place, and time. She appears well-developed and well-nourished. No distress.  HENT:  Head: Normocephalic and atraumatic.  Mouth/Throat: Oropharynx is clear and moist and mucous membranes are normal. No trismus in the jaw. Abnormal dentition. Dental abscesses and dental caries present.    Eyes: Conjunctivae are normal. Right eye exhibits no discharge. Left eye exhibits no discharge. No scleral icterus.  Cardiovascular: Normal rate.   Pulmonary/Chest: Effort normal.  Musculoskeletal:  No midline spinal tenderness. FROM of C, T, L spine. TTP of left lumbar paraspinal muscles.   Neurological: She is alert and oriented to person, place, and time. Coordination normal.  Strength 5/5 throughout. No sensory deficits.  No gait abnormality.  Skin: Skin is warm and dry. No rash noted. She is not diaphoretic. No erythema. No pallor.  Psychiatric: She has a normal mood and affect. Her behavior is  normal.  Nursing note and vitals reviewed.   ED Course  Procedures (including critical care time) Labs Review Labs Reviewed - No data to display  Imaging Review No results found. I have personally reviewed and evaluated these images and lab results as part of my medical decision-making.   EKG Interpretation None      MDM   Final diagnoses:  Pain, dental  Chronic back pain   Patient with toothache.  No gross abscess.  Exam unconcerning for Ludwig's angina or spread of infection.  Will treat with penicillin and pain medicine.  Urged patient to follow-up with dentist.  Pt also complaining of chronic back pain exacerbation. Pt sees pain management this and is under contract. She takes home percocet. No neurological deficits on exam. No sign of cauda equina, epidural abscess, spinal cord injury. Toradol IM given in ED. Recommend RICE precautions and follow up with PCP. Return precautions outlined in patient discharge instructions.       Dub MikesSamantha Tripp Jacie Tristan, PA-C  05/15/15 1610  Rolland Porter, MD 05/23/15 [phone]

## 2015-05-13 NOTE — ED Notes (Signed)
Dental pain x 2 months.  Sensitive to air.  Pt started having left lower back pain yesterday.  ? Slept wrong.  No change in urination.  No fever.

## 2015-05-13 NOTE — Discharge Instructions (Signed)
Chronic Back Pain  When back pain lasts longer than 3 months, it is called chronic back pain.People with chronic back pain often go through certain periods that are more intense (flare-ups).  CAUSES Chronic back pain can be caused by wear and tear (degeneration) on different structures in your back. These structures include:  The bones of your spine (vertebrae) and the joints surrounding your spinal cord and nerve roots (facets).  The strong, fibrous tissues that connect your vertebrae (ligaments). Degeneration of these structures may result in pressure on your nerves. This can lead to constant pain. HOME CARE INSTRUCTIONS  Avoid bending, heavy lifting, prolonged sitting, and activities which make the problem worse.  Take brief periods of rest throughout the day to reduce your pain. Lying down or standing usually is better than sitting while you are resting.  Take over-the-counter or prescription medicines only as directed by your caregiver. SEEK IMMEDIATE MEDICAL CARE IF:   You have weakness or numbness in one of your legs or feet.  You have trouble controlling your bladder or bowels.  You have nausea, vomiting, abdominal pain, shortness of breath, or fainting.   This information is not intended to replace advice given to you by your health care provider. Make sure you discuss any questions you have with your health care provider.   Document Released: 02/19/2004 Document Revised: 04/05/2011 Document Reviewed: 07/01/2014 Elsevier Interactive Patient Education 2016 [employer]  [employer] Dental The United [employer] 211 is a great source of information about community services available.  Access by dialing 2-1-1 from anywhere in [place], or by website -  PooledIncome.plwww.nc211.org.   Other Local Resources (Updated 01/2015)  Dental  Care   Services    Phone Number and Address  Cost  [facility] For children 480 - 44 years of age:    Cleaning  Tooth brushing/flossing instruction  Sealants, fillings, crowns  Extractions  Emergency treatment  [phone] [address] [place], [place] [id number] Charges based on family income.  Medicaid and some insurance plans accepted.     [facility]  Cleaning  Sealants, fillings, crowns  Extractions  Emergency treatment [phone] [address] [place], [place]  Pregnant women 44 years of age or older with a Medicaid card  [facility] [place]  Cleaning  Sealants, fillings, crowns  Extractions  Emergency treatment [phone] [address] [place], [place] Pregnant women 44 years of age or older with a Medicaid card  [facility] For children 260 - 44 years of age:   Cleaning  Tooth brushing/flossing instruction  Sealants, fillings, crowns  Extractions  Emergency treatment Limited orthodontic services for patients with Medicaid [phone] [address] [place], [place] [address] Medicaid and [facility] Health Choice cover for children up to age 44 and pregnant women.  Parents of children up to age 44 without Medicaid pay a reduced fee at time of service.  [facility] [place] For children 600 - 44 years of age:   Cleaning  Tooth brushing/flossing instruction  Sealants, fillings, crowns  Extractions  Emergency treatment Limited orthodontic services for patients with Medicaid [phone] [address] [place], [place].  Medicaid and [place] Health Choice cover for children up to age 44 and pregnant women.  Parents of children up to age 44 without Medicaid pay a reduced fee.  [facility]  Cleaning  Sealants, fillings, crowns  Extractions  Hours: Tuesdays and Thursdays, 4:15 - 8 pm [phone] [address] [place], [place]  [address] Services free of charge to [facility] residents ages 18-64 who do not have health insurance, Medicare, [place], or [place] benefits and fall within federal poverty guidelines  [employer]    Provides dental care in addition to primary medical care, nutritional counseling, and pharmacy:  Nurse, mental health, fillings, crowns  Extractions                  [phone] [facility], [address] [place], [place]  [phone] [facility], [address] [place], [place]  [phone] [facility] [place], [place]  [phone] [facility], [address] [place], [place]  [phone] [facility] [address] [place], [place] Accepts [place], [place], most insurance.  Also provides services available to all with fees adjusted based on ability to pay.    [facility]  Cleaning  Tooth brushing/flossing instruction  Sealants, fillings, crowns  Extractions  Emergency treatment Hours: Tuesdays, Thursdays, and Fridays from 8 am to 5 pm by appointment only. [phone] [address] [place], [place] [address] [facility] residents with Medicaid (depending on eligibility) and children with [facility] Health Choice - call for more information.  [facility]  Extractions only  Hours: 2nd and 4th Thursday of each month from 6:30 am - 9 am.   [phone] [address] [place], [place] [address] Ages 59 and older only.  Patients are seen on a first come, first served basis.  [employer]  [employer]  Extractions  Orthodontics  Endodontics  Implants/Crowns/Bridges  Complete and partial dentures [phone] [place], [place] Patients must complete an application for services.  There is often a waiting list.     Follow up with a dentist as soon as possible  for re-evaluation. Take antibiotics as prescribed. Take home pain medication as needed. Return to the ED if you experience difficulty swallowing, facial swelling, fevers, chills, numbness/tingling in both of your lower extremities, loss of bowel or bladder control.

## 2016-02-28 IMAGING — DX DG CHEST 2V
2 series · 2 of 2 positions shown · non-contrast
Comparison: 08/29/2014

CLINICAL DATA: Central chest and upper back pain for several days,
cough, shortness of breath, feels hot, question fever

EXAM:
CHEST  2 VIEW

[chest pa]
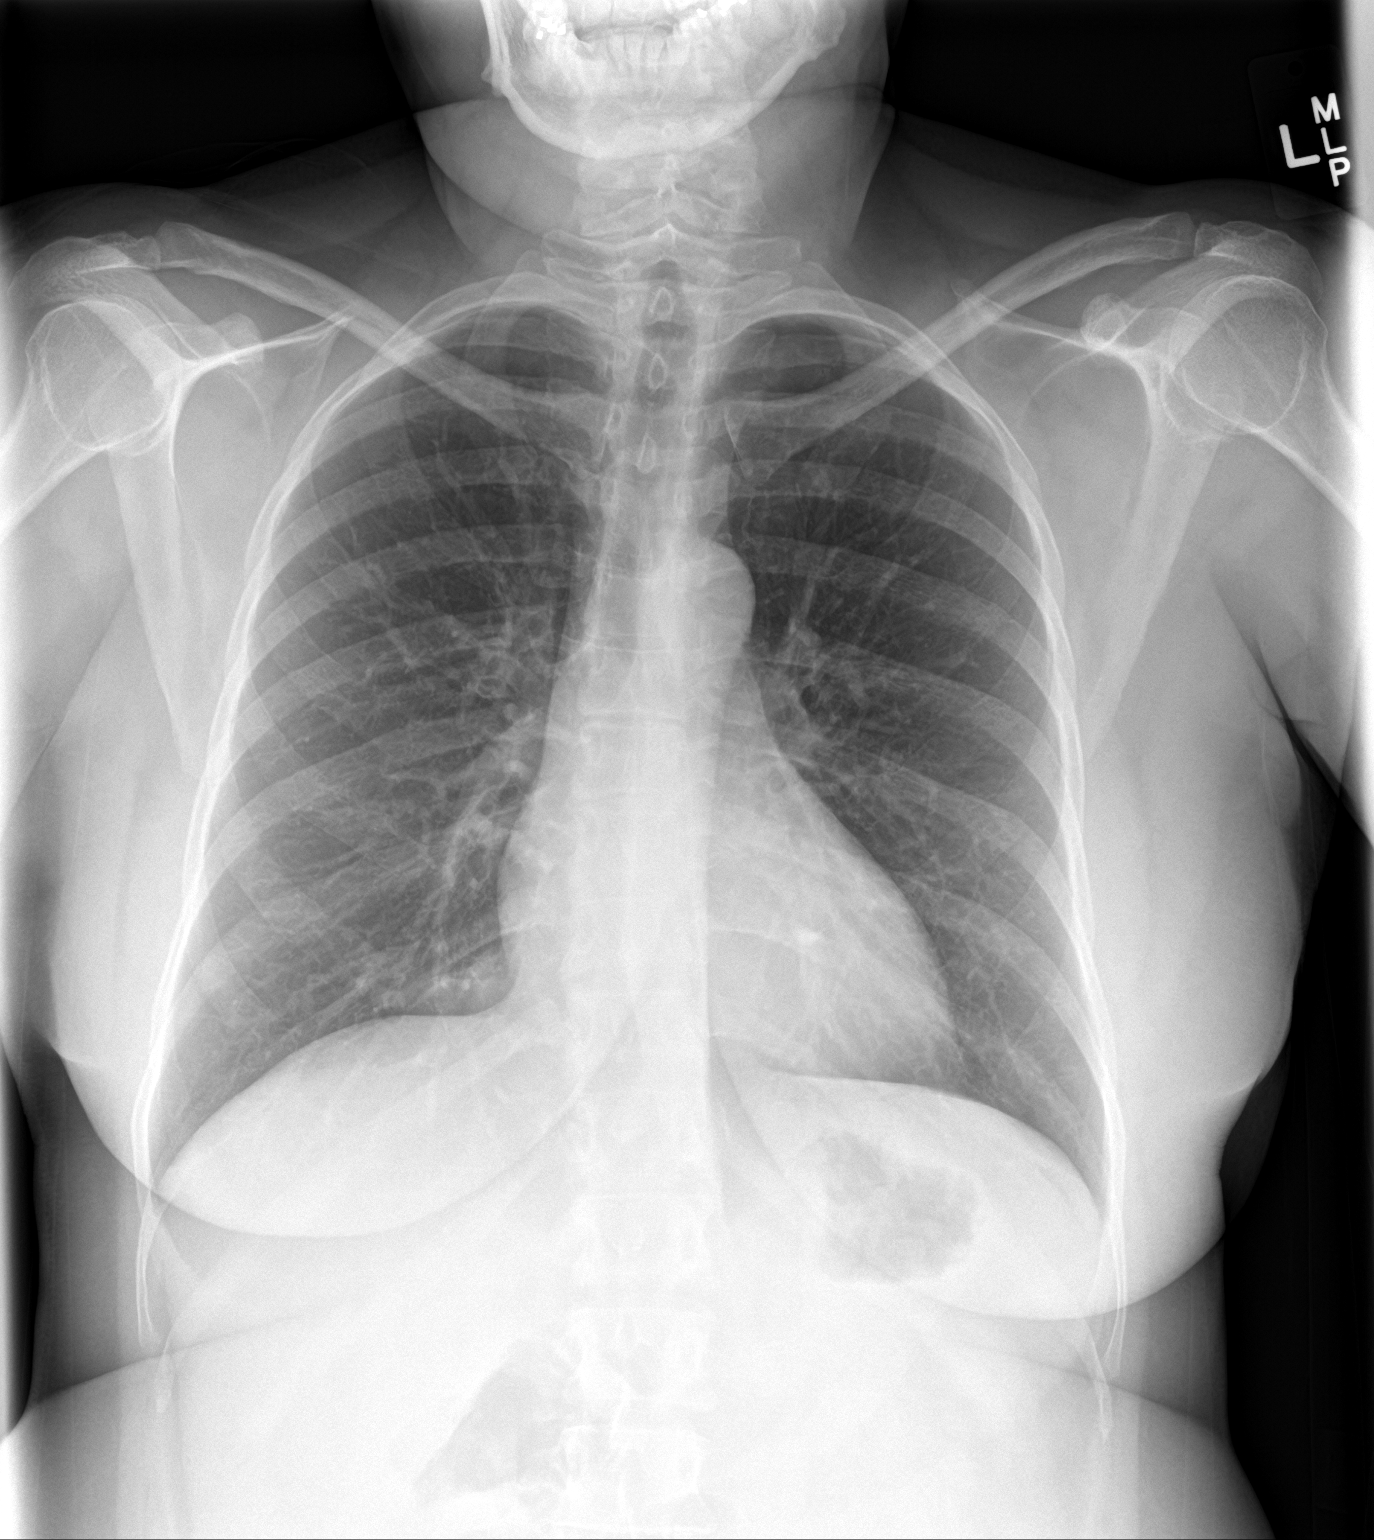

[chest lat]
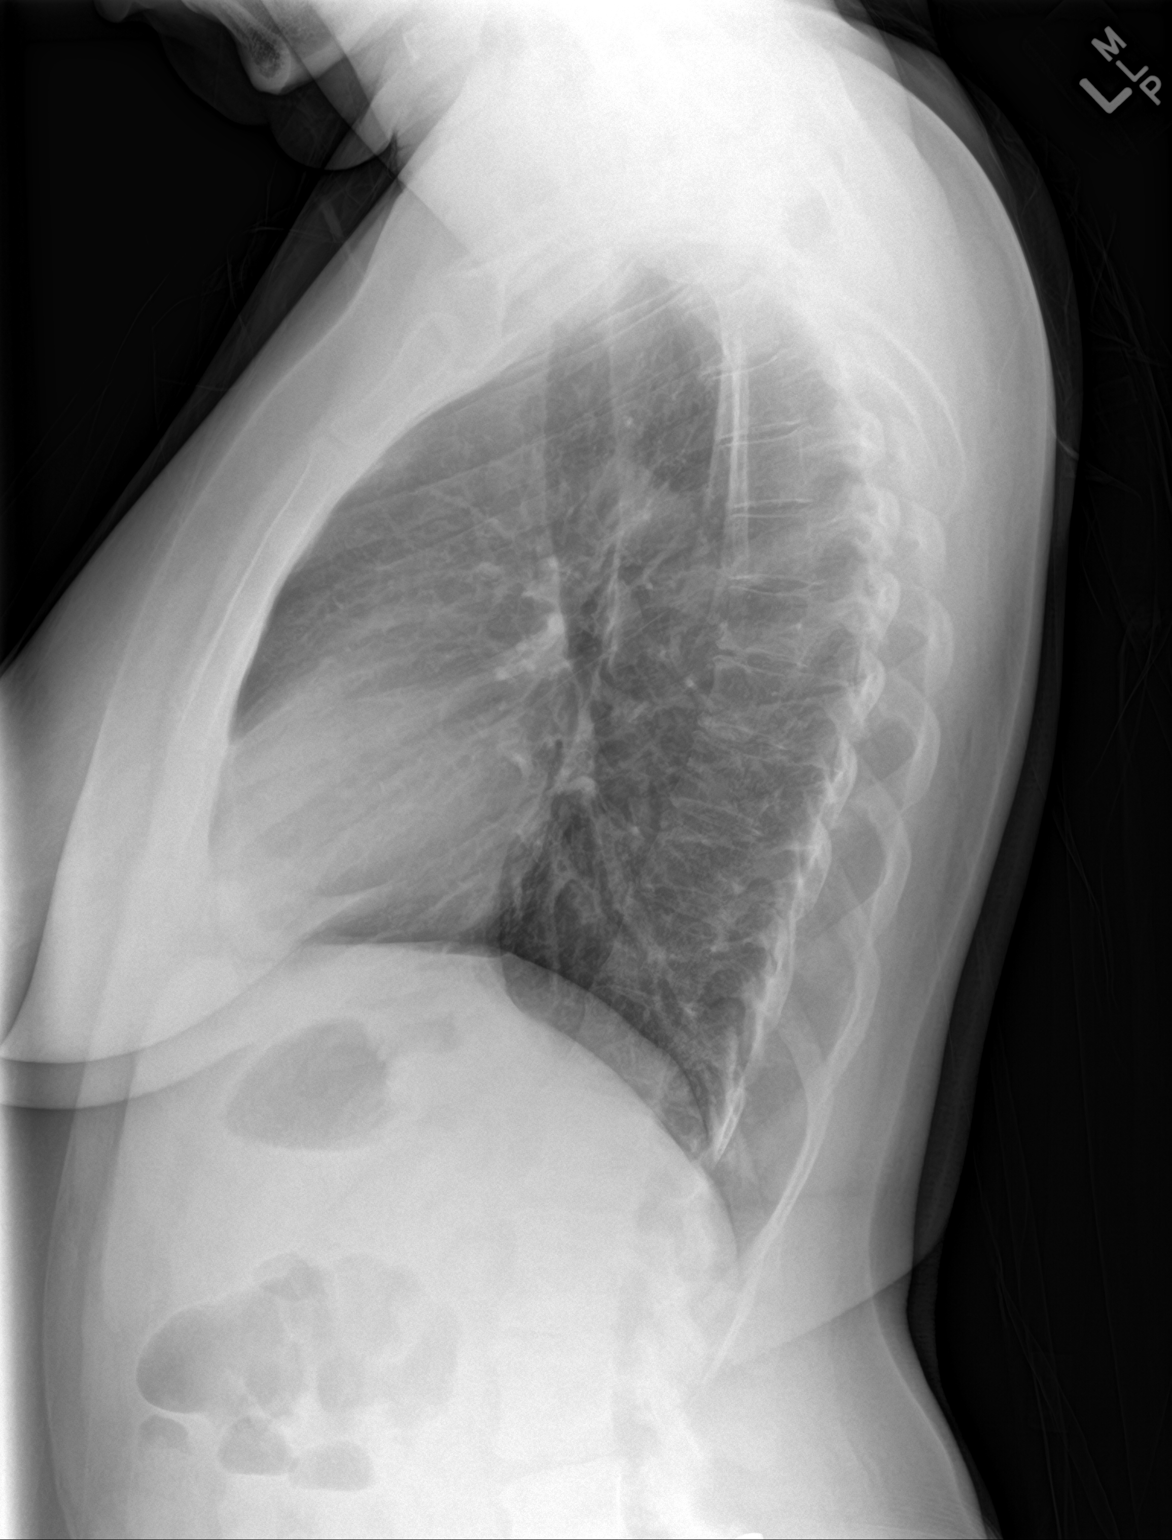

[2 of 2 positions shown; findings below may reference images not displayed]

FINDINGS: Normal heart size, mediastinal contours, and pulmonary vascularity.

Lungs clear.

No pneumothorax.

Bones unremarkable.
IMPRESSION: Normal exam.

## 2016-08-02 ENCOUNTER — Ambulatory Visit
Admission: RE | Admit: 2016-08-02 | Discharge: 2016-08-02 | Payer: MEDICARE | Attending: Family Medicine | Admitting: Family Medicine

## 2016-08-02 DIAGNOSIS — M7989 Other specified soft tissue disorders: Principal | ICD-10-CM

## 2016-08-02 DIAGNOSIS — M546 Pain in thoracic spine: Secondary | ICD-10-CM

## 2016-08-02 DIAGNOSIS — R3129 Other microscopic hematuria: Secondary | ICD-10-CM

## 2016-08-02 DIAGNOSIS — G43109 Migraine with aura, not intractable, without status migrainosus: Secondary | ICD-10-CM

## 2016-08-02 DIAGNOSIS — N200 Calculus of kidney: Secondary | ICD-10-CM

## 2016-08-02 DIAGNOSIS — G8929 Other chronic pain: Secondary | ICD-10-CM

## 2016-08-02 MED ORDER — SUMATRIPTAN 50 MG TABLET
ORAL_TABLET | 1 refills | 0 days | Status: CP
Start: 2016-08-02 — End: 2016-10-21

## 2016-08-09 MED ORDER — CETIRIZINE 5 MG-PSEUDOEPHEDRINE ER 120 MG TABLET,EXTENDED RELEASE,12HR: 1 | tablet | Freq: Two times a day (BID) | 0 refills | 0 days | Status: AC

## 2016-08-09 MED ORDER — CETIRIZINE 5 MG-PSEUDOEPHEDRINE ER 120 MG TABLET,EXTENDED RELEASE,12HR
ORAL_TABLET | Freq: Two times a day (BID) | ORAL | 0 refills | 0.00000 days | Status: CP | PRN
Start: 2016-08-09 — End: 2016-08-09

## 2016-08-16 MED ORDER — POTASSIUM CHLORIDE ER 20 MEQ TABLET,EXTENDED RELEASE(PART/CRYST)
ORAL_TABLET | Freq: Every day | ORAL | 1 refills | 0 days | Status: CP
Start: 2016-08-16 — End: 2016-10-21

## 2016-08-27 ENCOUNTER — Ambulatory Visit
Admission: RE | Admit: 2016-08-27 | Discharge: 2016-08-27 | Disposition: A | Discharge status: Home / Self Care | Payer: MEDICARE | Attending: Anesthesiology | Admitting: Anesthesiology

## 2016-08-27 DIAGNOSIS — M170 Bilateral primary osteoarthritis of knee: Secondary | ICD-10-CM

## 2016-08-27 DIAGNOSIS — Z79891 Long term (current) use of opiate analgesic: Secondary | ICD-10-CM

## 2016-08-27 DIAGNOSIS — G43109 Migraine with aura, not intractable, without status migrainosus: Principal | ICD-10-CM

## 2016-08-27 DIAGNOSIS — M5416 Radiculopathy, lumbar region: Secondary | ICD-10-CM

## 2016-08-27 DIAGNOSIS — M47816 Spondylosis without myelopathy or radiculopathy, lumbar region: Secondary | ICD-10-CM

## 2016-08-27 DIAGNOSIS — M7918 Myalgia, other site: Secondary | ICD-10-CM

## 2016-08-27 DIAGNOSIS — M7062 Trochanteric bursitis, left hip: Secondary | ICD-10-CM

## 2016-08-27 DIAGNOSIS — M7061 Trochanteric bursitis, right hip: Secondary | ICD-10-CM

## 2016-08-27 MED ORDER — DICLOFENAC 1 % TOPICAL GEL
Freq: Four times a day (QID) | TOPICAL | 5 refills | 0 days | Status: CP
Start: 2016-08-27 — End: 2017-01-28

## 2016-08-27 MED ORDER — CELECOXIB 200 MG CAPSULE
ORAL_CAPSULE | Freq: Two times a day (BID) | ORAL | 2 refills | 0 days | Status: CP
Start: 2016-08-27 — End: 2016-11-16

## 2016-08-27 MED ORDER — GABAPENTIN 800 MG TABLET
ORAL_TABLET | Freq: Four times a day (QID) | ORAL | 2 refills | 0.00000 days | Status: CP
Start: 2016-08-27 — End: 2016-11-16

## 2016-08-27 MED ORDER — BACLOFEN 10 MG TABLET
ORAL_TABLET | Freq: Three times a day (TID) | ORAL | 4 refills | 0 days | Status: CP | PRN
Start: 2016-08-27 — End: 2017-01-28

## 2016-08-30 MED ORDER — HYDROCHLOROTHIAZIDE 25 MG TABLET
ORAL_TABLET | 0 refills | 0 days | Status: CP
Start: 2016-08-30 — End: 2016-11-11

## 2016-09-17 ENCOUNTER — Ambulatory Visit: Admission: RE | Admit: 2016-09-17 | Discharge: 2016-09-17 | Payer: MEDICARE | Attending: Family Medicine

## 2016-09-17 DIAGNOSIS — L731 Pseudofolliculitis barbae: Principal | ICD-10-CM

## 2016-09-17 MED ORDER — SULFAMETHOXAZOLE 800 MG-TRIMETHOPRIM 160 MG TABLET
ORAL_TABLET | Freq: Two times a day (BID) | ORAL | 0 refills | 0 days | Status: CP
Start: 2016-09-17 — End: 2016-09-24

## 2016-09-28 ENCOUNTER — Ambulatory Visit: Admission: RE | Admit: 2016-09-28 | Discharge: 2016-09-28 | Disposition: A | Discharge status: Home / Self Care

## 2016-09-28 ENCOUNTER — Ambulatory Visit: Admission: RE | Admit: 2016-09-28 | Discharge: 2016-09-28 | Payer: MEDICARE | Attending: Family Medicine

## 2016-09-28 DIAGNOSIS — H109 Unspecified conjunctivitis: Principal | ICD-10-CM

## 2016-09-28 DIAGNOSIS — M961 Postlaminectomy syndrome, not elsewhere classified: Principal | ICD-10-CM

## 2016-09-28 DIAGNOSIS — N760 Acute vaginitis: Secondary | ICD-10-CM

## 2016-09-28 DIAGNOSIS — M170 Bilateral primary osteoarthritis of knee: Secondary | ICD-10-CM

## 2016-09-28 MED ORDER — SULFACETAMIDE SODIUM 10 % EYE DROPS
OPHTHALMIC | 0 refills | 0 days | Status: CP
Start: 2016-09-28 — End: 2016-10-08

## 2016-09-28 MED ORDER — FLUCONAZOLE 150 MG TABLET
ORAL_TABLET | Freq: Once | ORAL | 0 refills | 0.00000 days | Status: CP
Start: 2016-09-28 — End: 2016-09-28

## 2016-09-28 MED ORDER — OXYCODONE-ACETAMINOPHEN 10 MG-325 MG TABLET
ORAL_TABLET | Freq: Four times a day (QID) | ORAL | 0 refills | 0 days | Status: CP | PRN
Start: 2016-09-28 — End: 2016-11-16

## 2016-10-20 ENCOUNTER — Ambulatory Visit
Admission: RE | Admit: 2016-10-20 | Discharge: 2016-10-20 | Payer: MEDICARE | Attending: Anesthesiology | Admitting: Anesthesiology

## 2016-10-20 ENCOUNTER — Ambulatory Visit
Admission: RE | Admit: 2016-10-20 | Discharge: 2016-10-20 | Disposition: A | Discharge status: Home / Self Care | Payer: MEDICARE

## 2016-10-20 DIAGNOSIS — M5417 Radiculopathy, lumbosacral region: Secondary | ICD-10-CM

## 2016-10-20 DIAGNOSIS — M7918 Myalgia, other site: Secondary | ICD-10-CM

## 2016-10-20 DIAGNOSIS — M961 Postlaminectomy syndrome, not elsewhere classified: Principal | ICD-10-CM

## 2016-10-20 DIAGNOSIS — M5137 Other intervertebral disc degeneration, lumbosacral region: Principal | ICD-10-CM

## 2016-10-21 ENCOUNTER — Ambulatory Visit
Admission: RE | Admit: 2016-10-21 | Discharge: 2016-10-21 | Payer: MEDICARE | Attending: Family Medicine | Admitting: Family Medicine

## 2016-10-21 DIAGNOSIS — E876 Hypokalemia: Secondary | ICD-10-CM

## 2016-10-21 DIAGNOSIS — R0683 Snoring: Secondary | ICD-10-CM

## 2016-10-21 DIAGNOSIS — R3129 Other microscopic hematuria: Principal | ICD-10-CM

## 2016-10-21 DIAGNOSIS — R51 Headache: Secondary | ICD-10-CM

## 2016-10-21 DIAGNOSIS — G43109 Migraine with aura, not intractable, without status migrainosus: Secondary | ICD-10-CM

## 2016-10-21 MED ORDER — POTASSIUM CHLORIDE ER 20 MEQ TABLET,EXTENDED RELEASE(PART/CRYST)
ORAL_TABLET | Freq: Every day | ORAL | 1 refills | 0.00000 days | Status: CP
Start: 2016-10-21 — End: 2017-09-09

## 2016-10-21 MED ORDER — SUMATRIPTAN 50 MG TABLET
ORAL_TABLET | 1 refills | 0 days | Status: CP
Start: 2016-10-21 — End: 2017-03-01

## 2016-10-22 ENCOUNTER — Ambulatory Visit
Admission: RE | Admit: 2016-10-22 | Discharge: 2016-10-22 | Disposition: A | Discharge status: Home / Self Care | Payer: MEDICARE | Attending: Family Medicine | Admitting: Family Medicine

## 2016-10-22 ENCOUNTER — Ambulatory Visit
Admission: RE | Admit: 2016-10-22 | Discharge: 2016-10-22 | Disposition: A | Discharge status: Home / Self Care | Payer: MEDICARE

## 2016-10-22 DIAGNOSIS — M25561 Pain in right knee: Principal | ICD-10-CM

## 2016-10-22 DIAGNOSIS — M25562 Pain in left knee: Secondary | ICD-10-CM

## 2016-10-22 DIAGNOSIS — M1710 Unilateral primary osteoarthritis, unspecified knee: Secondary | ICD-10-CM

## 2016-10-28 ENCOUNTER — Ambulatory Visit
Admission: RE | Admit: 2016-10-28 | Discharge: 2016-10-28 | Disposition: A | Discharge status: Home / Self Care | Payer: MEDICARE | Attending: Anesthesiology | Admitting: Anesthesiology

## 2016-10-28 ENCOUNTER — Ambulatory Visit
Admission: RE | Admit: 2016-10-28 | Discharge: 2016-10-28 | Disposition: A | Discharge status: Home / Self Care | Payer: MEDICARE

## 2016-10-28 DIAGNOSIS — R52 Pain, unspecified: Principal | ICD-10-CM

## 2016-10-28 DIAGNOSIS — M7918 Myalgia, other site: Principal | ICD-10-CM

## 2016-11-14 MED ORDER — HYDROCHLOROTHIAZIDE 25 MG TABLET
ORAL_TABLET | 1 refills | 0 days | Status: CP
Start: 2016-11-14 — End: 2017-07-08

## 2016-11-15 ENCOUNTER — Ambulatory Visit: Admission: RE | Admit: 2016-11-15 | Discharge: 2016-11-15 | Payer: MEDICARE | Attending: Family Medicine

## 2016-11-15 ENCOUNTER — Ambulatory Visit: Admission: RE | Admit: 2016-11-15 | Discharge: 2016-11-15 | Payer: MEDICARE

## 2016-11-15 DIAGNOSIS — R109 Unspecified abdominal pain: Secondary | ICD-10-CM

## 2016-11-15 DIAGNOSIS — R3129 Other microscopic hematuria: Principal | ICD-10-CM

## 2016-11-15 DIAGNOSIS — N200 Calculus of kidney: Secondary | ICD-10-CM

## 2016-11-16 ENCOUNTER — Ambulatory Visit
Admission: RE | Admit: 2016-11-16 | Discharge: 2016-11-16 | Disposition: A | Discharge status: Home / Self Care | Payer: MEDICARE | Attending: Anesthesiology | Admitting: Anesthesiology

## 2016-11-16 DIAGNOSIS — M7918 Myalgia, other site: Principal | ICD-10-CM

## 2016-11-16 DIAGNOSIS — M47816 Spondylosis without myelopathy or radiculopathy, lumbar region: Secondary | ICD-10-CM

## 2016-11-16 DIAGNOSIS — M5417 Radiculopathy, lumbosacral region: Secondary | ICD-10-CM

## 2016-11-16 DIAGNOSIS — M170 Bilateral primary osteoarthritis of knee: Secondary | ICD-10-CM

## 2016-11-16 DIAGNOSIS — M5416 Radiculopathy, lumbar region: Secondary | ICD-10-CM

## 2016-11-16 MED ORDER — GABAPENTIN 800 MG TABLET
ORAL_TABLET | Freq: Four times a day (QID) | ORAL | 2 refills | 0.00000 days | Status: CP
Start: 2016-11-16 — End: 2017-01-28

## 2016-11-16 MED ORDER — OXYCODONE-ACETAMINOPHEN 10 MG-325 MG TABLET
ORAL_TABLET | Freq: Four times a day (QID) | ORAL | 0 refills | 0.00000 days | Status: CP | PRN
Start: 2016-11-16 — End: 2017-01-28

## 2016-11-16 MED ORDER — CELECOXIB 200 MG CAPSULE
ORAL_CAPSULE | Freq: Two times a day (BID) | ORAL | 2 refills | 0 days | Status: CP
Start: 2016-11-16 — End: 2017-01-28

## 2016-11-23 ENCOUNTER — Ambulatory Visit
Admission: RE | Admit: 2016-11-23 | Discharge: 2016-11-23 | Payer: MEDICARE | Attending: Gynecology | Admitting: Gynecology

## 2016-11-23 DIAGNOSIS — N951 Menopausal and female climacteric states: Principal | ICD-10-CM

## 2016-11-23 DIAGNOSIS — N952 Postmenopausal atrophic vaginitis: Secondary | ICD-10-CM

## 2016-12-03 ENCOUNTER — Ambulatory Visit
Admission: RE | Admit: 2016-12-03 | Discharge: 2016-12-03 | Disposition: A | Discharge status: Home / Self Care | Payer: MEDICARE

## 2016-12-03 DIAGNOSIS — R3129 Other microscopic hematuria: Principal | ICD-10-CM

## 2016-12-03 DIAGNOSIS — N200 Calculus of kidney: Secondary | ICD-10-CM

## 2016-12-15 ENCOUNTER — Ambulatory Visit: Admit: 2016-12-15 | Discharge: 2016-12-16 | Payer: MEDICARE

## 2016-12-15 DIAGNOSIS — R0683 Snoring: Secondary | ICD-10-CM

## 2016-12-15 DIAGNOSIS — R51 Headache: Principal | ICD-10-CM

## 2016-12-20 ENCOUNTER — Ambulatory Visit
Admission: RE | Admit: 2016-12-20 | Discharge: 2016-12-20 | Disposition: A | Discharge status: Home / Self Care | Payer: MEDICARE

## 2016-12-20 DIAGNOSIS — R0683 Snoring: Secondary | ICD-10-CM

## 2016-12-20 DIAGNOSIS — R51 Headache: Principal | ICD-10-CM

## 2016-12-20 MED ORDER — POTASSIUM CHLORIDE ER 20 MEQ TABLET,EXTENDED RELEASE(PART/CRYST)
ORAL_TABLET | 0 refills | 0 days | Status: CP
Start: 2016-12-20 — End: 2017-01-15

## 2016-12-21 ENCOUNTER — Ambulatory Visit
Admission: RE | Admit: 2016-12-21 | Discharge: 2016-12-21 | Payer: MEDICARE | Attending: Gynecology | Admitting: Gynecology

## 2016-12-21 DIAGNOSIS — N951 Menopausal and female climacteric states: Principal | ICD-10-CM

## 2016-12-21 DIAGNOSIS — R8290 Unspecified abnormal findings in urine: Secondary | ICD-10-CM

## 2016-12-21 DIAGNOSIS — R5383 Other fatigue: Secondary | ICD-10-CM

## 2016-12-21 DIAGNOSIS — N952 Postmenopausal atrophic vaginitis: Secondary | ICD-10-CM

## 2016-12-21 MED ORDER — ESTRADIOL 10 MCG VAGINAL TABLET
ORAL_TABLET | Freq: Every day | VAGINAL | 0 refills | 0 days | Status: CP
Start: 2016-12-21 — End: 2017-06-09

## 2016-12-21 MED ORDER — PROGESTERONE MICRONIZED 100 MG CAPSULE
ORAL_CAPSULE | Freq: Every evening | ORAL | 0 refills | 0 days | Status: CP
Start: 2016-12-21 — End: 2017-03-11

## 2016-12-22 ENCOUNTER — Ambulatory Visit: Admission: RE | Admit: 2016-12-22 | Discharge: 2016-12-22 | Payer: MEDICARE | Attending: Family Medicine

## 2016-12-22 DIAGNOSIS — J208 Acute bronchitis due to other specified organisms: Principal | ICD-10-CM

## 2016-12-22 DIAGNOSIS — J0110 Acute frontal sinusitis, unspecified: Secondary | ICD-10-CM

## 2016-12-22 MED ORDER — DOXYCYCLINE HYCLATE 100 MG CAPSULE
ORAL_CAPSULE | Freq: Every day | ORAL | 0 refills | 0 days | Status: CP
Start: 2016-12-22 — End: 2016-12-30

## 2016-12-23 MED ORDER — ESTRADIOL 0.0375 MG/24 HR SEMIWEEKLY TRANSDERMAL PATCH
MEDICATED_PATCH | TRANSDERMAL | 0 refills | 0 days | Status: CP
Start: 2016-12-23 — End: 2016-12-31

## 2016-12-27 MED ORDER — AZITHROMYCIN 250 MG TABLET
ORAL_TABLET | ORAL | 0 refills | 0.00000 days | Status: CP
Start: 2016-12-27 — End: 2016-12-30

## 2016-12-30 MED ORDER — AZITHROMYCIN 250 MG TABLET: tablet | 0 refills | 0 days

## 2016-12-30 MED ORDER — FLUCONAZOLE 150 MG TABLET
ORAL_TABLET | Freq: Once | ORAL | 0 refills | 0 days | Status: CP
Start: 2016-12-30 — End: 2016-12-30

## 2016-12-31 MED ORDER — ESTRADIOL 0.5 MG TABLET
ORAL_TABLET | Freq: Every day | ORAL | 1 refills | 0.00000 days | Status: CP
Start: 2016-12-31 — End: 2017-02-08

## 2017-01-19 MED ORDER — POTASSIUM CHLORIDE ER 20 MEQ TABLET,EXTENDED RELEASE(PART/CRYST)
ORAL_TABLET | 0 refills | 0 days | Status: CP
Start: 2017-01-19 — End: 2017-02-26

## 2017-01-21 ENCOUNTER — Ambulatory Visit
Admission: RE | Admit: 2017-01-21 | Discharge: 2017-01-21 | Disposition: A | Discharge status: Home / Self Care | Payer: MEDICARE

## 2017-01-21 DIAGNOSIS — R3129 Other microscopic hematuria: Principal | ICD-10-CM

## 2017-01-28 ENCOUNTER — Encounter: Admit: 2017-01-28 | Discharge: 2017-01-29 | Payer: MEDICARE

## 2017-01-28 DIAGNOSIS — M170 Bilateral primary osteoarthritis of knee: Principal | ICD-10-CM

## 2017-01-28 DIAGNOSIS — M5416 Radiculopathy, lumbar region: Secondary | ICD-10-CM

## 2017-01-28 DIAGNOSIS — M5417 Radiculopathy, lumbosacral region: Secondary | ICD-10-CM

## 2017-01-28 DIAGNOSIS — M7918 Myalgia, other site: Secondary | ICD-10-CM

## 2017-01-28 DIAGNOSIS — M47816 Spondylosis without myelopathy or radiculopathy, lumbar region: Secondary | ICD-10-CM

## 2017-01-28 MED ORDER — CELECOXIB 200 MG CAPSULE
ORAL_CAPSULE | Freq: Two times a day (BID) | ORAL | 2 refills | 0.00000 days | Status: CP
Start: 2017-01-28 — End: 2017-07-08

## 2017-01-28 MED ORDER — OXYCODONE-ACETAMINOPHEN 10 MG-325 MG TABLET
ORAL_TABLET | Freq: Four times a day (QID) | ORAL | 0 refills | 0 days | Status: CP | PRN
Start: 2017-01-28 — End: 2017-03-23

## 2017-01-28 MED ORDER — BACLOFEN 10 MG TABLET
ORAL_TABLET | Freq: Three times a day (TID) | ORAL | 5 refills | 0 days | Status: CP | PRN
Start: 2017-01-28 — End: 2017-08-02

## 2017-01-28 MED ORDER — GABAPENTIN 800 MG TABLET
ORAL_TABLET | Freq: Four times a day (QID) | ORAL | 5 refills | 0 days | Status: CP
Start: 2017-01-28 — End: 2017-08-02

## 2017-01-28 MED ORDER — DICLOFENAC 1 % TOPICAL GEL
Freq: Four times a day (QID) | TOPICAL | 5 refills | 0.00000 days | Status: CP
Start: 2017-01-28 — End: 2017-08-02

## 2017-02-08 MED ORDER — ESTRADIOL 0.5 MG TABLET
ORAL_TABLET | Freq: Every day | ORAL | 5 refills | 0 days | Status: CP
Start: 2017-02-08 — End: 2017-08-08

## 2017-02-09 ENCOUNTER — Encounter: Admit: 2017-02-09 | Discharge: 2017-02-09 | Payer: MEDICARE

## 2017-02-09 ENCOUNTER — Ambulatory Visit: Admit: 2017-02-09 | Discharge: 2017-02-09 | Payer: MEDICARE | Attending: Anesthesiology | Primary: Anesthesiology

## 2017-02-09 DIAGNOSIS — M5416 Radiculopathy, lumbar region: Principal | ICD-10-CM

## 2017-02-09 DIAGNOSIS — M5137 Other intervertebral disc degeneration, lumbosacral region: Principal | ICD-10-CM

## 2017-02-09 DIAGNOSIS — M7918 Myalgia, other site: Secondary | ICD-10-CM

## 2017-03-01 MED ORDER — POTASSIUM CHLORIDE ER 20 MEQ TABLET,EXTENDED RELEASE(PART/CRYST)
ORAL_TABLET | 0 refills | 0 days | Status: CP
Start: 2017-03-01 — End: 2017-03-10

## 2017-03-01 MED ORDER — SUMATRIPTAN 50 MG TABLET
ORAL_TABLET | 4 refills | 0 days | Status: CP
Start: 2017-03-01 — End: 2017-03-17

## 2017-03-10 MED ORDER — POTASSIUM CHLORIDE ER 20 MEQ TABLET,EXTENDED RELEASE(PART/CRYST)
ORAL_TABLET | 0 refills | 0 days | Status: CP
Start: 2017-03-10 — End: 2017-04-09

## 2017-03-11 MED ORDER — PROGESTERONE MICRONIZED 100 MG CAPSULE
ORAL_CAPSULE | Freq: Every evening | ORAL | 0 refills | 0.00000 days | Status: CP
Start: 2017-03-11 — End: 2017-04-25

## 2017-03-17 MED ORDER — SUMATRIPTAN 50 MG TABLET
ORAL_TABLET | 4 refills | 0 days | Status: CP
Start: 2017-03-17 — End: 2017-04-26

## 2017-03-23 ENCOUNTER — Ambulatory Visit: Admit: 2017-03-23 | Discharge: 2017-03-24 | Payer: MEDICARE

## 2017-03-23 DIAGNOSIS — M5416 Radiculopathy, lumbar region: Secondary | ICD-10-CM

## 2017-03-23 DIAGNOSIS — M5417 Radiculopathy, lumbosacral region: Secondary | ICD-10-CM

## 2017-03-23 DIAGNOSIS — M47816 Spondylosis without myelopathy or radiculopathy, lumbar region: Secondary | ICD-10-CM

## 2017-03-23 DIAGNOSIS — M7918 Myalgia, other site: Principal | ICD-10-CM

## 2017-03-23 DIAGNOSIS — M170 Bilateral primary osteoarthritis of knee: Secondary | ICD-10-CM

## 2017-03-23 DIAGNOSIS — M5412 Radiculopathy, cervical region: Secondary | ICD-10-CM

## 2017-03-23 MED ORDER — OXYCODONE-ACETAMINOPHEN 10 MG-325 MG TABLET
ORAL_TABLET | Freq: Four times a day (QID) | ORAL | 0 refills | 0.00000 days | Status: CP | PRN
Start: 2017-03-23 — End: 2017-05-24

## 2017-03-28 ENCOUNTER — Encounter: Admit: 2017-03-28 | Discharge: 2017-03-28 | Payer: MEDICARE | Attending: Family Medicine | Primary: Family Medicine

## 2017-03-28 ENCOUNTER — Encounter: Admit: 2017-03-28 | Discharge: 2017-03-28 | Payer: MEDICARE | Attending: Gynecology | Primary: Gynecology

## 2017-03-28 DIAGNOSIS — N951 Menopausal and female climacteric states: Secondary | ICD-10-CM

## 2017-03-28 DIAGNOSIS — R0981 Nasal congestion: Secondary | ICD-10-CM

## 2017-03-28 DIAGNOSIS — N952 Postmenopausal atrophic vaginitis: Principal | ICD-10-CM

## 2017-03-28 DIAGNOSIS — E669 Obesity, unspecified: Secondary | ICD-10-CM

## 2017-03-28 DIAGNOSIS — J0100 Acute maxillary sinusitis, unspecified: Secondary | ICD-10-CM

## 2017-03-28 DIAGNOSIS — R6882 Decreased libido: Secondary | ICD-10-CM

## 2017-03-28 DIAGNOSIS — J343 Hypertrophy of nasal turbinates: Principal | ICD-10-CM

## 2017-03-28 MED ORDER — AMOXICILLIN 500 MG CAPSULE
ORAL_CAPSULE | Freq: Three times a day (TID) | ORAL | 0 refills | 0 days | Status: CP
Start: 2017-03-28 — End: 2017-04-07

## 2017-03-28 MED ORDER — FLUTICASONE PROPIONATE 50 MCG/ACTUATION NASAL SPRAY,SUSPENSION
Freq: Every evening | NASAL | 11 refills | 0.00000 days | Status: CP
Start: 2017-03-28 — End: 2018-03-07

## 2017-03-28 MED ORDER — AZELASTINE 137 MCG (0.1 %) NASAL SPRAY AEROSOL
Freq: Two times a day (BID) | NASAL | 2 refills | 0.00000 days | Status: CP
Start: 2017-03-28 — End: 2017-04-26

## 2017-04-11 MED ORDER — POTASSIUM CHLORIDE ER 20 MEQ TABLET,EXTENDED RELEASE(PART/CRYST)
ORAL_TABLET | 0 refills | 0 days | Status: CP
Start: 2017-04-11 — End: 2017-05-09

## 2017-04-25 ENCOUNTER — Encounter: Admit: 2017-04-25 | Discharge: 2017-04-26 | Payer: MEDICARE | Attending: Gynecology | Primary: Gynecology

## 2017-04-25 DIAGNOSIS — N898 Other specified noninflammatory disorders of vagina: Principal | ICD-10-CM

## 2017-04-25 MED ORDER — PROGESTERONE MICRONIZED 100 MG CAPSULE
ORAL_CAPSULE | Freq: Every evening | ORAL | 3 refills | 0 days | Status: CP
Start: 2017-04-25 — End: 2017-07-06

## 2017-04-26 ENCOUNTER — Encounter: Admit: 2017-04-26 | Discharge: 2017-04-27 | Payer: MEDICARE | Attending: Family Medicine | Primary: Family Medicine

## 2017-04-26 DIAGNOSIS — J3089 Other allergic rhinitis: Secondary | ICD-10-CM

## 2017-04-26 DIAGNOSIS — G43109 Migraine with aura, not intractable, without status migrainosus: Secondary | ICD-10-CM

## 2017-04-26 DIAGNOSIS — J302 Other seasonal allergic rhinitis: Secondary | ICD-10-CM

## 2017-04-26 DIAGNOSIS — H1013 Acute atopic conjunctivitis, bilateral: Secondary | ICD-10-CM

## 2017-04-26 DIAGNOSIS — J0101 Acute recurrent maxillary sinusitis: Principal | ICD-10-CM

## 2017-04-26 DIAGNOSIS — J343 Hypertrophy of nasal turbinates: Secondary | ICD-10-CM

## 2017-04-26 MED ORDER — OLOPATADINE 0.2 % EYE DROPS
Freq: Two times a day (BID) | OPHTHALMIC | 5 refills | 0 days | Status: CP | PRN
Start: 2017-04-26 — End: 2017-05-27

## 2017-04-26 MED ORDER — AMOXICILLIN 875 MG-POTASSIUM CLAVULANATE 125 MG TABLET
ORAL_TABLET | Freq: Two times a day (BID) | ORAL | 0 refills | 0.00000 days | Status: CP
Start: 2017-04-26 — End: 2017-05-27

## 2017-04-26 MED ORDER — RIZATRIPTAN 5 MG DISINTEGRATING TABLET
ORAL_TABLET | Freq: Once | ORAL | 1 refills | 0 days | Status: CP | PRN
Start: 2017-04-26 — End: 2017-09-17

## 2017-04-26 MED ORDER — AZELASTINE 137 MCG (0.1 %) NASAL SPRAY AEROSOL
Freq: Two times a day (BID) | NASAL | 3 refills | 0 days | Status: CP
Start: 2017-04-26 — End: 2018-03-07

## 2017-04-26 MED ORDER — PREDNISONE 5 MG TABLET
ORAL_TABLET | 0 refills | 0 days | Status: CP
Start: 2017-04-26 — End: 2017-05-30

## 2017-05-03 ENCOUNTER — Ambulatory Visit: Admit: 2017-05-03 | Discharge: 2017-05-04 | Payer: MEDICARE | Attending: Family Medicine | Primary: Family Medicine

## 2017-05-03 DIAGNOSIS — N760 Acute vaginitis: Principal | ICD-10-CM

## 2017-05-03 MED ORDER — FLUCONAZOLE 150 MG TABLET
ORAL_TABLET | Freq: Once | ORAL | 0 refills | 0.00000 days | Status: CP
Start: 2017-05-03 — End: 2017-05-16

## 2017-05-09 MED ORDER — POTASSIUM CHLORIDE ER 20 MEQ TABLET,EXTENDED RELEASE(PART/CRYST)
ORAL_TABLET | 0 refills | 0 days | Status: CP
Start: 2017-05-09 — End: 2017-05-27

## 2017-05-16 MED ORDER — FLUCONAZOLE 150 MG TABLET
ORAL_TABLET | Freq: Once | ORAL | 0 refills | 0 days | Status: CP
Start: 2017-05-16 — End: 2017-05-16

## 2017-05-23 ENCOUNTER — Encounter: Admit: 2017-05-23 | Discharge: 2017-05-24 | Payer: MEDICARE

## 2017-05-23 DIAGNOSIS — Z111 Encounter for screening for respiratory tuberculosis: Principal | ICD-10-CM

## 2017-05-24 MED ORDER — OXYCODONE-ACETAMINOPHEN 10 MG-325 MG TABLET
ORAL_TABLET | Freq: Four times a day (QID) | ORAL | 0 refills | 0 days | Status: CP | PRN
Start: 2017-05-24 — End: 2017-05-30

## 2017-05-25 ENCOUNTER — Encounter: Admit: 2017-05-25 | Discharge: 2017-05-25 | Payer: MEDICARE

## 2017-05-25 ENCOUNTER — Encounter: Admit: 2017-05-25 | Discharge: 2017-05-25 | Payer: MEDICARE | Attending: Registered" | Primary: Registered"

## 2017-05-27 ENCOUNTER — Encounter: Admit: 2017-05-27 | Discharge: 2017-05-28 | Payer: MEDICARE | Attending: Family Medicine | Primary: Family Medicine

## 2017-05-27 DIAGNOSIS — H1013 Acute atopic conjunctivitis, bilateral: Secondary | ICD-10-CM

## 2017-05-27 DIAGNOSIS — Z1239 Encounter for other screening for malignant neoplasm of breast: Secondary | ICD-10-CM

## 2017-05-27 DIAGNOSIS — L299 Pruritus, unspecified: Secondary | ICD-10-CM

## 2017-05-27 DIAGNOSIS — R072 Precordial pain: Principal | ICD-10-CM

## 2017-05-27 MED ORDER — OLOPATADINE 0.2 % EYE DROPS
Freq: Two times a day (BID) | OPHTHALMIC | 5 refills | 0.00000 days | Status: CP | PRN
Start: 2017-05-27 — End: 2018-06-26

## 2017-05-27 MED ORDER — HYDROXYZINE HCL 25 MG TABLET
ORAL_TABLET | 0 refills | 0 days | Status: CP
Start: 2017-05-27 — End: 2017-09-20

## 2017-05-27 MED ORDER — PANTOPRAZOLE 40 MG TABLET,DELAYED RELEASE
ORAL_TABLET | Freq: Every day | ORAL | 0 refills | 0 days | Status: CP
Start: 2017-05-27 — End: 2017-06-29

## 2017-05-30 ENCOUNTER — Encounter: Admit: 2017-05-30 | Discharge: 2017-05-31 | Payer: MEDICARE

## 2017-05-30 DIAGNOSIS — M5417 Radiculopathy, lumbosacral region: Secondary | ICD-10-CM

## 2017-05-30 DIAGNOSIS — M5416 Radiculopathy, lumbar region: Secondary | ICD-10-CM

## 2017-05-30 DIAGNOSIS — M47816 Spondylosis without myelopathy or radiculopathy, lumbar region: Secondary | ICD-10-CM

## 2017-05-30 DIAGNOSIS — M170 Bilateral primary osteoarthritis of knee: Secondary | ICD-10-CM

## 2017-05-30 DIAGNOSIS — M7918 Myalgia, other site: Principal | ICD-10-CM

## 2017-05-30 MED ORDER — OXYCODONE-ACETAMINOPHEN 10 MG-325 MG TABLET
ORAL_TABLET | Freq: Four times a day (QID) | ORAL | 0 refills | 0 days | Status: CP | PRN
Start: 2017-05-30 — End: 2017-07-08

## 2017-06-02 ENCOUNTER — Encounter: Admit: 2017-06-02 | Discharge: 2017-06-03 | Payer: MEDICARE | Attending: Family Medicine | Primary: Family Medicine

## 2017-06-02 DIAGNOSIS — R198 Other specified symptoms and signs involving the digestive system and abdomen: Secondary | ICD-10-CM

## 2017-06-02 DIAGNOSIS — M7989 Other specified soft tissue disorders: Principal | ICD-10-CM

## 2017-06-02 DIAGNOSIS — R7989 Other specified abnormal findings of blood chemistry: Secondary | ICD-10-CM

## 2017-06-02 DIAGNOSIS — R0600 Dyspnea, unspecified: Secondary | ICD-10-CM

## 2017-06-02 DIAGNOSIS — R0789 Other chest pain: Secondary | ICD-10-CM

## 2017-06-02 DIAGNOSIS — R61 Generalized hyperhidrosis: Secondary | ICD-10-CM

## 2017-06-09 ENCOUNTER — Encounter: Admit: 2017-06-09 | Discharge: 2017-06-09 | Payer: MEDICARE

## 2017-06-09 DIAGNOSIS — M5417 Radiculopathy, lumbosacral region: Principal | ICD-10-CM

## 2017-06-09 MED ORDER — ESTRADIOL 10 MCG VAGINAL TABLET
ORAL_TABLET | Freq: Every day | VAGINAL | 2 refills | 0 days | Status: CP
Start: 2017-06-09 — End: 2018-06-20

## 2017-06-29 ENCOUNTER — Ambulatory Visit: Admit: 2017-06-29 | Discharge: 2017-06-30 | Payer: MEDICARE

## 2017-06-29 DIAGNOSIS — R3129 Other microscopic hematuria: Principal | ICD-10-CM

## 2017-06-29 MED ORDER — PANTOPRAZOLE 40 MG TABLET,DELAYED RELEASE
ORAL_TABLET | 0 refills | 0 days | Status: CP
Start: 2017-06-29 — End: 2017-08-25

## 2017-06-30 ENCOUNTER — Encounter: Admit: 2017-06-30 | Discharge: 2017-07-01 | Payer: MEDICARE | Attending: Gynecology | Primary: Gynecology

## 2017-06-30 ENCOUNTER — Encounter: Admit: 2017-06-30 | Discharge: 2017-07-01 | Payer: MEDICARE | Attending: Clinical | Primary: Clinical

## 2017-06-30 DIAGNOSIS — Z0000 Encounter for general adult medical examination without abnormal findings: Principal | ICD-10-CM

## 2017-07-06 ENCOUNTER — Encounter: Admit: 2017-07-06 | Discharge: 2017-07-07 | Payer: MEDICARE | Attending: Gynecology | Primary: Gynecology

## 2017-07-06 DIAGNOSIS — Z01419 Encounter for gynecological examination (general) (routine) without abnormal findings: Principal | ICD-10-CM

## 2017-07-06 MED ORDER — PROGESTERONE MICRONIZED 100 MG CAPSULE
ORAL_CAPSULE | 0 refills | 0 days | Status: CP
Start: 2017-07-06 — End: 2017-11-24

## 2017-07-08 ENCOUNTER — Encounter: Admit: 2017-07-08 | Discharge: 2017-07-09 | Payer: MEDICARE

## 2017-07-08 DIAGNOSIS — M5416 Radiculopathy, lumbar region: Secondary | ICD-10-CM

## 2017-07-08 DIAGNOSIS — M7918 Myalgia, other site: Principal | ICD-10-CM

## 2017-07-08 DIAGNOSIS — M5417 Radiculopathy, lumbosacral region: Secondary | ICD-10-CM

## 2017-07-08 DIAGNOSIS — M47816 Spondylosis without myelopathy or radiculopathy, lumbar region: Secondary | ICD-10-CM

## 2017-07-08 DIAGNOSIS — M170 Bilateral primary osteoarthritis of knee: Secondary | ICD-10-CM

## 2017-07-08 MED ORDER — CELECOXIB 200 MG CAPSULE
ORAL_CAPSULE | Freq: Two times a day (BID) | ORAL | 2 refills | 0.00000 days | Status: CP
Start: 2017-07-08 — End: 2017-09-27

## 2017-07-08 MED ORDER — TIZANIDINE 4 MG TABLET
ORAL_TABLET | Freq: Four times a day (QID) | ORAL | 2 refills | 0 days | Status: CP | PRN
Start: 2017-07-08 — End: 2017-09-20

## 2017-07-08 MED ORDER — POTASSIUM CHLORIDE ER 20 MEQ TABLET,EXTENDED RELEASE(PART/CRYST)
ORAL_TABLET | 0 refills | 0 days | Status: CP
Start: 2017-07-08 — End: 2017-08-07

## 2017-07-08 MED ORDER — OXYCODONE-ACETAMINOPHEN 10 MG-325 MG TABLET
ORAL_TABLET | Freq: Four times a day (QID) | ORAL | 0 refills | 0 days | Status: CP | PRN
Start: 2017-07-08 — End: 2017-08-02

## 2017-07-14 ENCOUNTER — Encounter: Admit: 2017-07-14 | Discharge: 2017-07-15 | Payer: MEDICARE | Attending: Anesthesiology | Primary: Anesthesiology

## 2017-07-14 ENCOUNTER — Encounter: Admit: 2017-07-14 | Discharge: 2017-07-15 | Payer: MEDICARE

## 2017-07-14 DIAGNOSIS — M5137 Other intervertebral disc degeneration, lumbosacral region: Principal | ICD-10-CM

## 2017-07-14 DIAGNOSIS — M5416 Radiculopathy, lumbar region: Principal | ICD-10-CM

## 2017-07-14 DIAGNOSIS — M7918 Myalgia, other site: Secondary | ICD-10-CM

## 2017-07-22 ENCOUNTER — Encounter
Admit: 2017-07-22 | Discharge: 2017-07-22 | Disposition: A | Discharge status: Home / Self Care | Payer: MEDICARE | Attending: Emergency Medicine

## 2017-07-22 ENCOUNTER — Ambulatory Visit
Admit: 2017-07-22 | Discharge: 2017-07-22 | Disposition: A | Discharge status: Home / Self Care | Payer: MEDICARE | Attending: Emergency Medicine

## 2017-07-22 ENCOUNTER — Ambulatory Visit: Admit: 2017-07-22 | Discharge: 2017-07-23 | Payer: MEDICARE

## 2017-07-22 DIAGNOSIS — R0789 Other chest pain: Principal | ICD-10-CM

## 2017-07-22 DIAGNOSIS — R079 Chest pain, unspecified: Principal | ICD-10-CM

## 2017-07-22 DIAGNOSIS — R0602 Shortness of breath: Secondary | ICD-10-CM

## 2017-07-22 MED ORDER — ASPIRIN 325 MG TABLET,DELAYED RELEASE
ORAL_TABLET | Freq: Every day | ORAL | 3 refills | 0 days | Status: CP
Start: 2017-07-22 — End: 2017-07-22

## 2017-07-29 ENCOUNTER — Encounter: Admit: 2017-07-29 | Discharge: 2017-07-29 | Payer: MEDICARE

## 2017-07-29 DIAGNOSIS — R3129 Other microscopic hematuria: Principal | ICD-10-CM

## 2017-08-02 ENCOUNTER — Encounter: Admit: 2017-08-02 | Discharge: 2017-08-03 | Payer: MEDICARE | Attending: Anesthesiology | Primary: Anesthesiology

## 2017-08-02 ENCOUNTER — Encounter: Admit: 2017-08-02 | Discharge: 2017-08-03 | Payer: MEDICARE | Attending: Psychologist | Primary: Psychologist

## 2017-08-02 DIAGNOSIS — F419 Anxiety disorder, unspecified: Secondary | ICD-10-CM

## 2017-08-02 DIAGNOSIS — M5416 Radiculopathy, lumbar region: Secondary | ICD-10-CM

## 2017-08-02 DIAGNOSIS — M5417 Radiculopathy, lumbosacral region: Secondary | ICD-10-CM

## 2017-08-02 DIAGNOSIS — M7918 Myalgia, other site: Secondary | ICD-10-CM

## 2017-08-02 DIAGNOSIS — M47816 Spondylosis without myelopathy or radiculopathy, lumbar region: Secondary | ICD-10-CM

## 2017-08-02 DIAGNOSIS — M170 Bilateral primary osteoarthritis of knee: Secondary | ICD-10-CM

## 2017-08-02 DIAGNOSIS — F1190 Opioid use, unspecified, uncomplicated: Secondary | ICD-10-CM

## 2017-08-02 MED ORDER — GABAPENTIN 800 MG TABLET
ORAL_TABLET | Freq: Four times a day (QID) | ORAL | 5 refills | 0 days | Status: CP
Start: 2017-08-02 — End: 2017-09-27

## 2017-08-02 MED ORDER — OXYCODONE-ACETAMINOPHEN 10 MG-325 MG TABLET
ORAL_TABLET | Freq: Four times a day (QID) | ORAL | 0 refills | 0 days | Status: CP | PRN
Start: 2017-08-02 — End: 2017-09-27

## 2017-08-02 MED ORDER — DICLOFENAC 1 % TOPICAL GEL
Freq: Four times a day (QID) | TOPICAL | 5 refills | 0 days | Status: CP
Start: 2017-08-02 — End: 2017-11-17

## 2017-08-08 MED ORDER — POTASSIUM CHLORIDE ER 20 MEQ TABLET,EXTENDED RELEASE(PART/CRYST)
ORAL_TABLET | 0 refills | 0 days | Status: CP
Start: 2017-08-08 — End: 2017-09-06

## 2017-08-08 MED ORDER — ESTRADIOL 0.5 MG TABLET
ORAL_TABLET | Freq: Every day | ORAL | 1 refills | 0 days | Status: CP
Start: 2017-08-08 — End: 2017-08-12

## 2017-08-11 ENCOUNTER — Encounter: Admit: 2017-08-11 | Discharge: 2017-08-12 | Payer: MEDICARE | Attending: Anesthesiology | Primary: Anesthesiology

## 2017-08-11 ENCOUNTER — Encounter: Admit: 2017-08-11 | Discharge: 2017-08-12 | Payer: MEDICARE

## 2017-08-11 DIAGNOSIS — M7918 Myalgia, other site: Principal | ICD-10-CM

## 2017-08-11 DIAGNOSIS — R52 Pain, unspecified: Principal | ICD-10-CM

## 2017-08-12 MED ORDER — ESTRADIOL 0.5 MG TABLET
ORAL_TABLET | Freq: Every day | ORAL | 0 refills | 0 days | Status: CP
Start: 2017-08-12 — End: 2017-11-11

## 2017-08-17 ENCOUNTER — Ambulatory Visit: Admit: 2017-08-17 | Discharge: 2017-08-18 | Payer: MEDICARE | Attending: Family Medicine | Primary: Family Medicine

## 2017-08-17 DIAGNOSIS — R0789 Other chest pain: Secondary | ICD-10-CM

## 2017-08-17 DIAGNOSIS — R198 Other specified symptoms and signs involving the digestive system and abdomen: Principal | ICD-10-CM

## 2017-08-17 DIAGNOSIS — Z1239 Encounter for other screening for malignant neoplasm of breast: Secondary | ICD-10-CM

## 2017-08-17 DIAGNOSIS — R0602 Shortness of breath: Secondary | ICD-10-CM

## 2017-08-26 MED ORDER — PANTOPRAZOLE 40 MG TABLET,DELAYED RELEASE
ORAL_TABLET | 0 refills | 0 days | Status: CP
Start: 2017-08-26 — End: 2017-09-27

## 2017-09-06 MED ORDER — POTASSIUM CHLORIDE ER 20 MEQ TABLET,EXTENDED RELEASE(PART/CRYST)
ORAL_TABLET | 0 refills | 0 days | Status: CP
Start: 2017-09-06 — End: 2017-09-09

## 2017-09-09 ENCOUNTER — Ambulatory Visit: Admit: 2017-09-09 | Discharge: 2017-09-09 | Disposition: A | Discharge status: Home / Self Care | Payer: MEDICARE

## 2017-09-09 MED ORDER — SULFAMETHOXAZOLE 800 MG-TRIMETHOPRIM 160 MG TABLET
ORAL_TABLET | Freq: Two times a day (BID) | ORAL | 0 refills | 0 days | Status: CP
Start: 2017-09-09 — End: 2017-09-16

## 2017-09-13 MED ORDER — BACLOFEN 10 MG TABLET
ORAL_TABLET | Freq: Three times a day (TID) | ORAL | 0 refills | 0 days | Status: CP
Start: 2017-09-13 — End: 2017-10-06

## 2017-09-14 ENCOUNTER — Non-Acute Institutional Stay: Admit: 2017-09-14 | Discharge: 2017-09-15 | Payer: MEDICARE

## 2017-09-14 ENCOUNTER — Ambulatory Visit: Admit: 2017-09-14 | Discharge: 2017-09-15 | Payer: MEDICARE | Attending: Family Medicine | Primary: Family Medicine

## 2017-09-14 DIAGNOSIS — R3129 Other microscopic hematuria: Secondary | ICD-10-CM

## 2017-09-14 DIAGNOSIS — R001 Bradycardia, unspecified: Principal | ICD-10-CM

## 2017-09-14 DIAGNOSIS — R42 Dizziness and giddiness: Secondary | ICD-10-CM

## 2017-09-18 MED ORDER — RIZATRIPTAN 5 MG DISINTEGRATING TABLET
ORAL_TABLET | 2 refills | 0 days | Status: CP
Start: 2017-09-18 — End: 2018-03-13

## 2017-09-20 ENCOUNTER — Encounter: Admit: 2017-09-20 | Discharge: 2017-09-21 | Payer: MEDICARE | Attending: Family Medicine | Primary: Family Medicine

## 2017-09-20 DIAGNOSIS — R42 Dizziness and giddiness: Secondary | ICD-10-CM

## 2017-09-20 DIAGNOSIS — L299 Pruritus, unspecified: Secondary | ICD-10-CM

## 2017-09-20 DIAGNOSIS — N760 Acute vaginitis: Principal | ICD-10-CM

## 2017-09-20 MED ORDER — FLUCONAZOLE 150 MG TABLET
ORAL_TABLET | Freq: Once | ORAL | 0 refills | 0 days | Status: CP
Start: 2017-09-20 — End: 2017-09-20

## 2017-09-20 MED ORDER — DESONIDE 0.05 % TOPICAL CREAM
Freq: Two times a day (BID) | TOPICAL | 0 refills | 0 days | Status: CP | PRN
Start: 2017-09-20 — End: 2018-07-03

## 2017-09-20 MED ORDER — HYDROXYZINE HCL 25 MG TABLET
ORAL_TABLET | 0 refills | 0 days | Status: CP
Start: 2017-09-20 — End: 2017-10-16

## 2017-09-27 ENCOUNTER — Encounter: Admit: 2017-09-27 | Discharge: 2017-09-28 | Payer: MEDICARE | Attending: Anesthesiology | Primary: Anesthesiology

## 2017-09-27 DIAGNOSIS — M5416 Radiculopathy, lumbar region: Secondary | ICD-10-CM

## 2017-09-27 DIAGNOSIS — M170 Bilateral primary osteoarthritis of knee: Secondary | ICD-10-CM

## 2017-09-27 DIAGNOSIS — M7918 Myalgia, other site: Secondary | ICD-10-CM

## 2017-09-27 DIAGNOSIS — Z0289 Encounter for other administrative examinations: Secondary | ICD-10-CM

## 2017-09-27 DIAGNOSIS — M5417 Radiculopathy, lumbosacral region: Secondary | ICD-10-CM

## 2017-09-27 DIAGNOSIS — G894 Chronic pain syndrome: Principal | ICD-10-CM

## 2017-09-27 DIAGNOSIS — M47816 Spondylosis without myelopathy or radiculopathy, lumbar region: Secondary | ICD-10-CM

## 2017-09-27 MED ORDER — CELECOXIB 200 MG CAPSULE
ORAL_CAPSULE | Freq: Two times a day (BID) | ORAL | 2 refills | 0 days | Status: CP
Start: 2017-09-27 — End: 2018-01-12

## 2017-09-27 MED ORDER — GABAPENTIN 800 MG TABLET
ORAL_TABLET | Freq: Two times a day (BID) | ORAL | 5 refills | 0 days | Status: CP
Start: 2017-09-27 — End: 2018-02-22

## 2017-09-27 MED ORDER — OXYCODONE-ACETAMINOPHEN 10 MG-325 MG TABLET
ORAL_TABLET | Freq: Four times a day (QID) | ORAL | 0 refills | 0.00000 days | Status: CP | PRN
Start: 2017-09-27 — End: 2017-11-17

## 2017-09-28 MED ORDER — TIZANIDINE 4 MG TABLET
ORAL_TABLET | 0 refills | 0 days | Status: CP
Start: 2017-09-28 — End: 2017-11-24

## 2017-09-28 MED ORDER — PANTOPRAZOLE 40 MG TABLET,DELAYED RELEASE
ORAL_TABLET | 0 refills | 0 days | Status: CP
Start: 2017-09-28 — End: 2017-11-05

## 2017-10-06 MED ORDER — POTASSIUM CHLORIDE ER 20 MEQ TABLET,EXTENDED RELEASE(PART/CRYST)
ORAL_TABLET | 0 refills | 0 days | Status: CP
Start: 2017-10-06 — End: 2017-11-05

## 2017-10-07 MED ORDER — BACLOFEN 10 MG TABLET
ORAL_TABLET | 0 refills | 0 days | Status: CP
Start: 2017-10-07 — End: 2017-11-07

## 2017-10-09 MED ORDER — PROGESTERONE MICRONIZED 100 MG CAPSULE
ORAL_CAPSULE | 5 refills | 0 days | Status: CP
Start: 2017-10-09 — End: 2018-04-04

## 2017-10-12 ENCOUNTER — Encounter: Admit: 2017-10-12 | Discharge: 2017-10-12 | Disposition: A | Discharge status: Home / Self Care | Payer: MEDICARE

## 2017-10-17 MED ORDER — HYDROXYZINE HCL 25 MG TABLET
ORAL_TABLET | 0 refills | 0 days | Status: CP
Start: 2017-10-17 — End: 2018-03-22

## 2017-10-18 ENCOUNTER — Encounter: Admit: 2017-10-18 | Discharge: 2017-10-19 | Payer: MEDICARE | Attending: Family Medicine | Primary: Family Medicine

## 2017-10-18 ENCOUNTER — Ambulatory Visit: Admit: 2017-10-18 | Discharge: 2017-10-19 | Payer: MEDICARE

## 2017-10-18 DIAGNOSIS — M170 Bilateral primary osteoarthritis of knee: Principal | ICD-10-CM

## 2017-10-18 DIAGNOSIS — M25551 Pain in right hip: Principal | ICD-10-CM

## 2017-10-18 DIAGNOSIS — M25552 Pain in left hip: Secondary | ICD-10-CM

## 2017-10-27 ENCOUNTER — Encounter: Admit: 2017-10-27 | Discharge: 2017-10-27 | Payer: MEDICARE

## 2017-10-27 ENCOUNTER — Ambulatory Visit: Admit: 2017-10-27 | Discharge: 2017-10-27 | Payer: MEDICARE | Attending: Anesthesiology | Primary: Anesthesiology

## 2017-10-27 DIAGNOSIS — G894 Chronic pain syndrome: Principal | ICD-10-CM

## 2017-10-27 DIAGNOSIS — M5416 Radiculopathy, lumbar region: Principal | ICD-10-CM

## 2017-10-27 DIAGNOSIS — M7918 Myalgia, other site: Secondary | ICD-10-CM

## 2017-11-07 MED ORDER — BACLOFEN 10 MG TABLET
ORAL_TABLET | 0 refills | 0 days | Status: CP
Start: 2017-11-07 — End: 2017-12-05

## 2017-11-08 MED ORDER — PANTOPRAZOLE 40 MG TABLET,DELAYED RELEASE
ORAL_TABLET | 0 refills | 0 days | Status: CP
Start: 2017-11-08 — End: 2017-12-05

## 2017-11-08 MED ORDER — POTASSIUM CHLORIDE ER 20 MEQ TABLET,EXTENDED RELEASE(PART/CRYST)
ORAL_TABLET | 0 refills | 0 days | Status: CP
Start: 2017-11-08 — End: 2017-12-05

## 2017-11-17 ENCOUNTER — Encounter: Admit: 2017-11-17 | Discharge: 2017-11-18 | Payer: MEDICARE

## 2017-11-17 DIAGNOSIS — M5416 Radiculopathy, lumbar region: Secondary | ICD-10-CM

## 2017-11-17 DIAGNOSIS — G894 Chronic pain syndrome: Principal | ICD-10-CM

## 2017-11-17 DIAGNOSIS — M5417 Radiculopathy, lumbosacral region: Secondary | ICD-10-CM

## 2017-11-17 DIAGNOSIS — M170 Bilateral primary osteoarthritis of knee: Secondary | ICD-10-CM

## 2017-11-17 DIAGNOSIS — M7918 Myalgia, other site: Secondary | ICD-10-CM

## 2017-11-17 DIAGNOSIS — M47816 Spondylosis without myelopathy or radiculopathy, lumbar region: Secondary | ICD-10-CM

## 2017-11-17 MED ORDER — OXYCODONE-ACETAMINOPHEN 10 MG-325 MG TABLET
ORAL_TABLET | Freq: Four times a day (QID) | ORAL | 0 refills | 0.00000 days | Status: CP | PRN
Start: 2017-11-17 — End: 2017-12-05

## 2017-11-17 MED ORDER — DICLOFENAC 1 % TOPICAL GEL
Freq: Four times a day (QID) | TOPICAL | 5 refills | 0 days | Status: CP
Start: 2017-11-17 — End: 2018-05-23

## 2017-11-18 MED ORDER — ESTRADIOL 0.5 MG TABLET
ORAL_TABLET | 5 refills | 0 days | Status: CP
Start: 2017-11-18 — End: 2017-11-24

## 2017-11-24 ENCOUNTER — Ambulatory Visit: Admit: 2017-11-24 | Discharge: 2017-11-25 | Payer: MEDICARE | Attending: Family Medicine | Primary: Family Medicine

## 2017-11-24 DIAGNOSIS — R51 Headache: Secondary | ICD-10-CM

## 2017-11-24 DIAGNOSIS — R3129 Other microscopic hematuria: Secondary | ICD-10-CM

## 2017-11-24 DIAGNOSIS — R350 Frequency of micturition: Secondary | ICD-10-CM

## 2017-11-24 DIAGNOSIS — E782 Mixed hyperlipidemia: Secondary | ICD-10-CM

## 2017-11-24 DIAGNOSIS — L299 Pruritus, unspecified: Principal | ICD-10-CM

## 2017-11-24 DIAGNOSIS — G932 Benign intracranial hypertension: Secondary | ICD-10-CM

## 2017-11-24 MED ORDER — FLUOCINOLONE 0.01 % SCALP OIL AND SHOWER CAP
Freq: Every day | TOPICAL | 1 refills | 0 days | Status: CP
Start: 2017-11-24 — End: 2018-11-24

## 2017-11-24 MED ORDER — ACETAZOLAMIDE 250 MG TABLET
ORAL_TABLET | Freq: Three times a day (TID) | ORAL | 0 refills | 0.00000 days | Status: CP
Start: 2017-11-24 — End: 2017-12-19

## 2017-12-05 ENCOUNTER — Encounter: Admit: 2017-12-05 | Discharge: 2017-12-06 | Payer: MEDICARE | Attending: Psychologist | Primary: Psychologist

## 2017-12-05 ENCOUNTER — Encounter: Admit: 2017-12-05 | Discharge: 2017-12-06 | Payer: MEDICARE | Attending: Anesthesiology | Primary: Anesthesiology

## 2017-12-05 DIAGNOSIS — M7918 Myalgia, other site: Principal | ICD-10-CM

## 2017-12-05 DIAGNOSIS — F1190 Opioid use, unspecified, uncomplicated: Secondary | ICD-10-CM

## 2017-12-05 DIAGNOSIS — M47816 Spondylosis without myelopathy or radiculopathy, lumbar region: Secondary | ICD-10-CM

## 2017-12-05 DIAGNOSIS — M5417 Radiculopathy, lumbosacral region: Secondary | ICD-10-CM

## 2017-12-05 DIAGNOSIS — M170 Bilateral primary osteoarthritis of knee: Secondary | ICD-10-CM

## 2017-12-05 DIAGNOSIS — F419 Anxiety disorder, unspecified: Principal | ICD-10-CM

## 2017-12-05 DIAGNOSIS — M5416 Radiculopathy, lumbar region: Secondary | ICD-10-CM

## 2017-12-05 MED ORDER — BACLOFEN 10 MG TABLET
ORAL_TABLET | Freq: Three times a day (TID) | ORAL | 2 refills | 0 days | Status: CP | PRN
Start: 2017-12-05 — End: 2018-02-06

## 2017-12-06 MED ORDER — PANTOPRAZOLE 40 MG TABLET,DELAYED RELEASE
ORAL_TABLET | 0 refills | 0 days | Status: CP
Start: 2017-12-06 — End: 2018-01-04

## 2017-12-06 MED ORDER — POTASSIUM CHLORIDE ER 20 MEQ TABLET,EXTENDED RELEASE(PART/CRYST)
ORAL_TABLET | 0 refills | 0 days | Status: CP
Start: 2017-12-06 — End: 2018-01-04

## 2017-12-14 ENCOUNTER — Encounter: Admit: 2017-12-14 | Discharge: 2017-12-15 | Payer: MEDICARE

## 2017-12-14 DIAGNOSIS — G932 Benign intracranial hypertension: Secondary | ICD-10-CM

## 2017-12-14 DIAGNOSIS — R3129 Other microscopic hematuria: Secondary | ICD-10-CM

## 2017-12-14 DIAGNOSIS — E782 Mixed hyperlipidemia: Secondary | ICD-10-CM

## 2017-12-14 DIAGNOSIS — R350 Frequency of micturition: Principal | ICD-10-CM

## 2017-12-15 ENCOUNTER — Ambulatory Visit: Admit: 2017-12-15 | Discharge: 2017-12-16 | Payer: MEDICARE | Attending: Family Medicine | Primary: Family Medicine

## 2017-12-15 DIAGNOSIS — H9203 Otalgia, bilateral: Secondary | ICD-10-CM

## 2017-12-15 DIAGNOSIS — E7800 Pure hypercholesterolemia, unspecified: Principal | ICD-10-CM

## 2017-12-15 DIAGNOSIS — R8290 Unspecified abnormal findings in urine: Secondary | ICD-10-CM

## 2017-12-15 MED ORDER — ATORVASTATIN 20 MG TABLET
ORAL_TABLET | 3 refills | 0 days | Status: CP
Start: 2017-12-15 — End: 2018-03-22

## 2017-12-16 MED ORDER — OXYCODONE-ACETAMINOPHEN 10 MG-325 MG TABLET
ORAL_TABLET | Freq: Four times a day (QID) | ORAL | 0 refills | 0 days | Status: CP | PRN
Start: 2017-12-16 — End: 2018-02-22

## 2017-12-16 MED ORDER — CEPHALEXIN 500 MG TABLET
ORAL_TABLET | Freq: Three times a day (TID) | ORAL | 0 refills | 0.00000 days | Status: CP
Start: 2017-12-16 — End: 2017-12-18

## 2017-12-18 MED ORDER — CEPHALEXIN 500 MG CAPSULE
ORAL_CAPSULE | Freq: Three times a day (TID) | ORAL | 0 refills | 0 days | Status: CP
Start: 2017-12-18 — End: 2018-03-07

## 2017-12-19 MED ORDER — ACETAZOLAMIDE 250 MG TABLET
ORAL_TABLET | 0 refills | 0 days | Status: CP
Start: 2017-12-19 — End: 2018-01-16

## 2018-01-04 MED ORDER — POTASSIUM CHLORIDE ER 20 MEQ TABLET,EXTENDED RELEASE(PART/CRYST)
ORAL_TABLET | 2 refills | 0 days | Status: CP
Start: 2018-01-04 — End: 2018-04-04

## 2018-01-04 MED ORDER — PANTOPRAZOLE 40 MG TABLET,DELAYED RELEASE
ORAL_TABLET | 2 refills | 0 days | Status: CP
Start: 2018-01-04 — End: 2018-04-04

## 2018-01-05 ENCOUNTER — Ambulatory Visit: Admit: 2018-01-05 | Discharge: 2018-01-06 | Payer: MEDICARE | Attending: Family Medicine | Primary: Family Medicine

## 2018-01-05 DIAGNOSIS — R0989 Other specified symptoms and signs involving the circulatory and respiratory systems: Secondary | ICD-10-CM

## 2018-01-05 DIAGNOSIS — N760 Acute vaginitis: Principal | ICD-10-CM

## 2018-01-05 MED ORDER — FLUCONAZOLE 150 MG TABLET
ORAL_TABLET | 0 refills | 0 days | Status: CP
Start: 2018-01-05 — End: 2018-02-09

## 2018-01-12 MED ORDER — CELECOXIB 200 MG CAPSULE
ORAL_CAPSULE | Freq: Two times a day (BID) | ORAL | 2 refills | 0.00000 days | Status: CP
Start: 2018-01-12 — End: 2018-02-22

## 2018-01-15 MED ORDER — OXYCODONE-ACETAMINOPHEN 10 MG-325 MG TABLET
ORAL_TABLET | Freq: Four times a day (QID) | ORAL | 0 refills | 0 days | Status: CP | PRN
Start: 2018-01-15 — End: 2018-02-22

## 2018-01-16 MED ORDER — ACETAZOLAMIDE 250 MG TABLET
ORAL_TABLET | 0 refills | 0 days | Status: CP
Start: 2018-01-16 — End: 2018-06-27

## 2018-01-24 ENCOUNTER — Encounter: Admit: 2018-01-24 | Discharge: 2018-01-25 | Payer: MEDICARE

## 2018-01-24 DIAGNOSIS — Z111 Encounter for screening for respiratory tuberculosis: Principal | ICD-10-CM

## 2018-01-25 ENCOUNTER — Encounter: Admit: 2018-01-25 | Discharge: 2018-01-25 | Payer: MEDICARE

## 2018-01-25 ENCOUNTER — Encounter: Admit: 2018-01-25 | Discharge: 2018-01-25 | Payer: MEDICARE | Attending: Anesthesiology | Primary: Anesthesiology

## 2018-01-26 ENCOUNTER — Non-Acute Institutional Stay: Admit: 2018-01-26 | Discharge: 2018-01-27 | Payer: MEDICARE

## 2018-02-06 MED ORDER — BACLOFEN 10 MG TABLET
ORAL_TABLET | Freq: Three times a day (TID) | ORAL | 2 refills | 0 days | Status: CP | PRN
Start: 2018-02-06 — End: 2018-02-22

## 2018-02-09 ENCOUNTER — Encounter: Admit: 2018-02-09 | Discharge: 2018-02-10 | Payer: MEDICARE | Attending: Family Medicine | Primary: Family Medicine

## 2018-02-09 DIAGNOSIS — N3001 Acute cystitis with hematuria: Secondary | ICD-10-CM

## 2018-02-09 DIAGNOSIS — N760 Acute vaginitis: Secondary | ICD-10-CM

## 2018-02-09 DIAGNOSIS — R300 Dysuria: Secondary | ICD-10-CM

## 2018-02-09 DIAGNOSIS — R3989 Other symptoms and signs involving the genitourinary system: Principal | ICD-10-CM

## 2018-02-09 MED ORDER — NITROFURANTOIN MONOHYDRATE/MACROCRYSTALS 100 MG CAPSULE
ORAL_CAPSULE | Freq: Two times a day (BID) | ORAL | 0 refills | 0 days | Status: CP
Start: 2018-02-09 — End: 2018-03-07

## 2018-02-09 MED ORDER — PHENAZOPYRIDINE 200 MG TABLET
ORAL_TABLET | Freq: Three times a day (TID) | ORAL | 0 refills | 0 days | Status: CP | PRN
Start: 2018-02-09 — End: 2018-03-07

## 2018-02-09 MED ORDER — FLUCONAZOLE 150 MG TABLET
ORAL_TABLET | 0 refills | 0 days | Status: CP
Start: 2018-02-09 — End: 2018-03-07

## 2018-02-14 MED ORDER — OXYCODONE-ACETAMINOPHEN 10 MG-325 MG TABLET
ORAL_TABLET | Freq: Four times a day (QID) | ORAL | 0 refills | 0 days | Status: CP | PRN
Start: 2018-02-14 — End: 2018-02-22

## 2018-02-17 ENCOUNTER — Encounter: Admit: 2018-02-17 | Discharge: 2018-02-17 | Payer: MEDICARE | Attending: Anesthesiology | Primary: Anesthesiology

## 2018-02-17 ENCOUNTER — Encounter: Admit: 2018-02-17 | Discharge: 2018-02-17 | Payer: MEDICARE

## 2018-02-22 ENCOUNTER — Ambulatory Visit: Admit: 2018-02-22 | Discharge: 2018-02-22 | Payer: MEDICARE | Attending: Anesthesiology | Primary: Anesthesiology

## 2018-02-22 DIAGNOSIS — M5416 Radiculopathy, lumbar region: Secondary | ICD-10-CM

## 2018-02-22 DIAGNOSIS — M47816 Spondylosis without myelopathy or radiculopathy, lumbar region: Secondary | ICD-10-CM

## 2018-02-22 DIAGNOSIS — M5417 Radiculopathy, lumbosacral region: Secondary | ICD-10-CM

## 2018-02-22 DIAGNOSIS — M7918 Myalgia, other site: Principal | ICD-10-CM

## 2018-02-22 DIAGNOSIS — M170 Bilateral primary osteoarthritis of knee: Secondary | ICD-10-CM

## 2018-02-22 MED ORDER — OXYCODONE-ACETAMINOPHEN 10 MG-325 MG TABLET: 1 | tablet | Freq: Four times a day (QID) | 0 refills | 0 days | Status: AC

## 2018-02-22 MED ORDER — OXYCODONE-ACETAMINOPHEN 10 MG-325 MG TABLET: tablet | 0 refills | 0 days | Status: AC

## 2018-02-22 MED ORDER — BACLOFEN 10 MG TABLET
ORAL_TABLET | Freq: Three times a day (TID) | ORAL | 2 refills | 0 days | Status: CP | PRN
Start: 2018-02-22 — End: 2018-05-23

## 2018-02-22 MED ORDER — OXYCODONE-ACETAMINOPHEN 10 MG-325 MG TABLET
ORAL_TABLET | 0 refills | 0 days | Status: CP
Start: 2018-02-22 — End: 2018-05-23

## 2018-02-22 MED ORDER — CELECOXIB 200 MG CAPSULE
ORAL_CAPSULE | Freq: Two times a day (BID) | ORAL | 2 refills | 0 days | Status: CP
Start: 2018-02-22 — End: 2018-05-23

## 2018-02-22 MED ORDER — GABAPENTIN 800 MG TABLET
ORAL_TABLET | Freq: Two times a day (BID) | ORAL | 2 refills | 0 days | Status: CP
Start: 2018-02-22 — End: 2018-05-23

## 2018-03-07 ENCOUNTER — Ambulatory Visit: Admit: 2018-03-07 | Discharge: 2018-03-08 | Payer: MEDICARE | Attending: Family Medicine | Primary: Family Medicine

## 2018-03-07 DIAGNOSIS — G4710 Hypersomnia, unspecified: Secondary | ICD-10-CM

## 2018-03-07 DIAGNOSIS — J3089 Other allergic rhinitis: Secondary | ICD-10-CM

## 2018-03-07 DIAGNOSIS — J302 Other seasonal allergic rhinitis: Secondary | ICD-10-CM

## 2018-03-07 DIAGNOSIS — G4719 Other hypersomnia: Secondary | ICD-10-CM

## 2018-03-07 DIAGNOSIS — R0683 Snoring: Principal | ICD-10-CM

## 2018-03-07 DIAGNOSIS — J343 Hypertrophy of nasal turbinates: Secondary | ICD-10-CM

## 2018-03-07 MED ORDER — AZELASTINE 137 MCG (0.1 %) NASAL SPRAY AEROSOL
Freq: Two times a day (BID) | NASAL | 11 refills | 0 days | Status: CP
Start: 2018-03-07 — End: 2019-03-07

## 2018-03-07 MED ORDER — FLUTICASONE PROPIONATE 50 MCG/ACTUATION NASAL SPRAY,SUSPENSION
Freq: Every evening | NASAL | 11 refills | 0 days | Status: CP
Start: 2018-03-07 — End: 2019-03-07

## 2018-03-13 MED ORDER — RIZATRIPTAN 5 MG DISINTEGRATING TABLET
ORAL_TABLET | 2 refills | 0 days | Status: CP
Start: 2018-03-13 — End: 2018-06-06

## 2018-03-22 ENCOUNTER — Encounter: Admit: 2018-03-22 | Discharge: 2018-03-22 | Payer: MEDICARE | Attending: Anesthesiology | Primary: Anesthesiology

## 2018-03-22 ENCOUNTER — Encounter: Admit: 2018-03-22 | Discharge: 2018-03-22 | Payer: MEDICARE

## 2018-03-22 DIAGNOSIS — M47817 Spondylosis without myelopathy or radiculopathy, lumbosacral region: Principal | ICD-10-CM

## 2018-03-22 DIAGNOSIS — M5417 Radiculopathy, lumbosacral region: Principal | ICD-10-CM

## 2018-03-22 DIAGNOSIS — M7918 Myalgia, other site: Secondary | ICD-10-CM

## 2018-03-22 DIAGNOSIS — M5416 Radiculopathy, lumbar region: Secondary | ICD-10-CM

## 2018-03-23 MED ORDER — ATORVASTATIN 20 MG TABLET
ORAL_TABLET | 3 refills | 0 days | Status: CP
Start: 2018-03-23 — End: 2018-06-26

## 2018-03-23 MED ORDER — HYDROXYZINE HCL 25 MG TABLET
ORAL_TABLET | 0 refills | 0 days | Status: CP
Start: 2018-03-23 — End: 2018-06-26

## 2018-03-24 MED ORDER — OXYCODONE-ACETAMINOPHEN 10 MG-325 MG TABLET
ORAL_TABLET | Freq: Four times a day (QID) | ORAL | 0 refills | 0.00000 days | Status: CP | PRN
Start: 2018-03-24 — End: 2018-02-22

## 2018-04-04 DIAGNOSIS — E876 Hypokalemia: Principal | ICD-10-CM

## 2018-04-04 DIAGNOSIS — R072 Precordial pain: Principal | ICD-10-CM

## 2018-04-04 MED ORDER — PROGESTERONE MICRONIZED 100 MG CAPSULE
ORAL_CAPSULE | 0 refills | 0 days | Status: CP
Start: 2018-04-04 — End: 2018-07-03

## 2018-04-04 MED ORDER — POTASSIUM CHLORIDE ER 20 MEQ TABLET,EXTENDED RELEASE(PART/CRYST)
ORAL_TABLET | 2 refills | 0 days | Status: CP
Start: 2018-04-04 — End: 2018-07-03

## 2018-04-04 MED ORDER — PANTOPRAZOLE 40 MG TABLET,DELAYED RELEASE
ORAL_TABLET | 2 refills | 0 days | Status: CP
Start: 2018-04-04 — End: 2018-07-03

## 2018-04-14 ENCOUNTER — Telehealth: Admit: 2018-04-14 | Discharge: 2018-04-15 | Payer: MEDICARE

## 2018-04-14 DIAGNOSIS — Z20828 Contact with and (suspected) exposure to other viral communicable diseases: Principal | ICD-10-CM

## 2018-04-23 MED ORDER — OXYCODONE-ACETAMINOPHEN 10 MG-325 MG TABLET
ORAL_TABLET | Freq: Four times a day (QID) | ORAL | 0 refills | 0.00000 days | Status: CP | PRN
Start: 2018-04-23 — End: 2018-02-22

## 2018-05-01 ENCOUNTER — Encounter: Admit: 2018-05-01 | Discharge: 2018-05-01 | Payer: MEDICARE | Attending: Family Medicine | Primary: Family Medicine

## 2018-05-01 ENCOUNTER — Encounter: Admit: 2018-05-01 | Discharge: 2018-05-01 | Payer: MEDICARE

## 2018-05-01 DIAGNOSIS — R300 Dysuria: Principal | ICD-10-CM

## 2018-05-05 MED ORDER — ESTRADIOL 0.5 MG TABLET
ORAL_TABLET | 2 refills | 0 days | Status: CP
Start: 2018-05-05 — End: 2018-08-04

## 2018-05-11 ENCOUNTER — Encounter: Admit: 2018-05-11 | Discharge: 2018-05-12 | Payer: MEDICARE

## 2018-05-11 DIAGNOSIS — R3989 Other symptoms and signs involving the genitourinary system: Principal | ICD-10-CM

## 2018-05-15 MED ORDER — FLUCONAZOLE 150 MG TABLET
ORAL_TABLET | Freq: Once | ORAL | 0 refills | 0 days | Status: CP
Start: 2018-05-15 — End: 2018-05-15

## 2018-05-15 MED ORDER — PHENAZOPYRIDINE 200 MG TABLET
ORAL_TABLET | Freq: Three times a day (TID) | ORAL | 0 refills | 0 days | Status: CP | PRN
Start: 2018-05-15 — End: 2018-05-17

## 2018-05-17 MED ORDER — PHENAZOPYRIDINE 100 MG TABLET
ORAL_TABLET | Freq: Three times a day (TID) | ORAL | 0 refills | 0 days | Status: CP | PRN
Start: 2018-05-17 — End: 2018-05-19

## 2018-05-23 ENCOUNTER — Encounter: Admit: 2018-05-23 | Discharge: 2018-05-24 | Payer: MEDICARE | Attending: Anesthesiology | Primary: Anesthesiology

## 2018-05-23 DIAGNOSIS — M7918 Myalgia, other site: Principal | ICD-10-CM

## 2018-05-23 DIAGNOSIS — M47816 Spondylosis without myelopathy or radiculopathy, lumbar region: Secondary | ICD-10-CM

## 2018-05-23 DIAGNOSIS — M5417 Radiculopathy, lumbosacral region: Secondary | ICD-10-CM

## 2018-05-23 DIAGNOSIS — M170 Bilateral primary osteoarthritis of knee: Secondary | ICD-10-CM

## 2018-05-23 DIAGNOSIS — M5416 Radiculopathy, lumbar region: Secondary | ICD-10-CM

## 2018-05-23 MED ORDER — DICLOFENAC 1 % TOPICAL GEL
Freq: Four times a day (QID) | TOPICAL | 5 refills | 0.00000 days | Status: CP
Start: 2018-05-23 — End: ?

## 2018-05-23 MED ORDER — NALOXONE 4 MG/ACTUATION NASAL SPRAY
0 refills | 0 days | Status: CP
Start: 2018-05-23 — End: ?

## 2018-05-23 MED ORDER — GABAPENTIN 800 MG TABLET
ORAL_TABLET | Freq: Two times a day (BID) | ORAL | 2 refills | 0.00000 days | Status: CP
Start: 2018-05-23 — End: 2018-08-15

## 2018-05-23 MED ORDER — CELECOXIB 200 MG CAPSULE
ORAL_CAPSULE | Freq: Two times a day (BID) | ORAL | 2 refills | 0 days | Status: CP | PRN
Start: 2018-05-23 — End: 2018-08-15

## 2018-05-23 MED ORDER — BACLOFEN 10 MG TABLET
ORAL_TABLET | Freq: Three times a day (TID) | ORAL | 2 refills | 0 days | Status: CP | PRN
Start: 2018-05-23 — End: 2018-08-15

## 2018-06-06 MED ORDER — RIZATRIPTAN 5 MG DISINTEGRATING TABLET
ORAL_TABLET | 2 refills | 0 days | Status: CP
Start: 2018-06-06 — End: 2018-07-03

## 2018-06-07 MED ORDER — OXYCODONE-ACETAMINOPHEN 10 MG-325 MG TABLET
ORAL_TABLET | ORAL | 0 refills | 0 days | Status: CP | PRN
Start: 2018-06-07 — End: 2018-08-15

## 2018-06-20 MED ORDER — ESTRADIOL 10 MCG VAGINAL TABLET
ORAL_TABLET | 0 refills | 0 days | Status: CP
Start: 2018-06-20 — End: ?

## 2018-06-27 ENCOUNTER — Telehealth: Admit: 2018-06-27 | Discharge: 2018-06-28 | Payer: MEDICARE | Attending: Family Medicine | Primary: Family Medicine

## 2018-06-27 DIAGNOSIS — N760 Acute vaginitis: Principal | ICD-10-CM

## 2018-06-27 DIAGNOSIS — B9689 Other specified bacterial agents as the cause of diseases classified elsewhere: Secondary | ICD-10-CM

## 2018-06-27 DIAGNOSIS — R51 Headache: Secondary | ICD-10-CM

## 2018-06-27 MED ORDER — METRONIDAZOLE 500 MG TABLET
ORAL_TABLET | Freq: Two times a day (BID) | ORAL | 0 refills | 0.00000 days | Status: CP
Start: 2018-06-27 — End: 2018-06-30

## 2018-06-27 MED ORDER — FLUCONAZOLE 150 MG TABLET
ORAL_TABLET | Freq: Once | ORAL | 0 refills | 0 days | Status: CP
Start: 2018-06-27 — End: 2018-06-27

## 2018-06-27 MED ORDER — OLOPATADINE 0.2 % EYE DROPS
Freq: Two times a day (BID) | OPHTHALMIC | 5 refills | 0 days | Status: CP | PRN
Start: 2018-06-27 — End: ?

## 2018-06-27 MED ORDER — ATORVASTATIN 20 MG TABLET
ORAL_TABLET | 3 refills | 0 days | Status: CP
Start: 2018-06-27 — End: ?

## 2018-06-27 MED ORDER — NORTRIPTYLINE 10 MG CAPSULE
ORAL_CAPSULE | Freq: Every evening | ORAL | 3 refills | 0.00000 days | Status: CP
Start: 2018-06-27 — End: 2019-06-27

## 2018-06-27 MED ORDER — HYDROXYZINE HCL 25 MG TABLET
ORAL_TABLET | 0 refills | 0 days | Status: CP
Start: 2018-06-27 — End: ?

## 2018-06-30 MED ORDER — CLINDAMYCIN HCL 300 MG CAPSULE
ORAL_CAPSULE | Freq: Two times a day (BID) | ORAL | 0 refills | 0.00000 days | Status: CP
Start: 2018-06-30 — End: 2018-08-15

## 2018-07-03 MED ORDER — PROGESTERONE MICRONIZED 100 MG CAPSULE
ORAL_CAPSULE | 0 refills | 0 days | Status: CP
Start: 2018-07-03 — End: 2018-09-25

## 2018-07-03 MED ORDER — PANTOPRAZOLE 40 MG TABLET,DELAYED RELEASE
ORAL_TABLET | 2 refills | 0 days | Status: CP
Start: 2018-07-03 — End: 2018-09-25

## 2018-07-03 MED ORDER — POTASSIUM CHLORIDE ER 20 MEQ TABLET,EXTENDED RELEASE(PART/CRYST)
ORAL_TABLET | 2 refills | 0 days | Status: CP
Start: 2018-07-03 — End: 2018-09-25

## 2018-07-04 MED ORDER — RIZATRIPTAN 5 MG DISINTEGRATING TABLET
ORAL_TABLET | 2 refills | 0 days | Status: CP
Start: 2018-07-04 — End: ?

## 2018-07-04 MED ORDER — DESONIDE 0.05 % TOPICAL CREAM
Freq: Two times a day (BID) | TOPICAL | 0 refills | 0 days | Status: CP | PRN
Start: 2018-07-04 — End: ?

## 2018-07-07 MED ORDER — OXYCODONE-ACETAMINOPHEN 10 MG-325 MG TABLET
ORAL_TABLET | ORAL | 0 refills | 0 days | Status: CP | PRN
Start: 2018-07-07 — End: 2018-08-14

## 2018-07-17 ENCOUNTER — Encounter: Admit: 2018-07-17 | Discharge: 2018-07-18 | Payer: MEDICARE | Attending: Gynecology | Primary: Gynecology

## 2018-07-17 DIAGNOSIS — Z1231 Encounter for screening mammogram for malignant neoplasm of breast: Secondary | ICD-10-CM

## 2018-07-17 DIAGNOSIS — N898 Other specified noninflammatory disorders of vagina: Secondary | ICD-10-CM

## 2018-07-17 DIAGNOSIS — N952 Postmenopausal atrophic vaginitis: Secondary | ICD-10-CM

## 2018-07-17 DIAGNOSIS — Z01419 Encounter for gynecological examination (general) (routine) without abnormal findings: Principal | ICD-10-CM

## 2018-07-17 DIAGNOSIS — E2839 Other primary ovarian failure: Secondary | ICD-10-CM

## 2018-08-03 ENCOUNTER — Telehealth: Admit: 2018-08-03 | Discharge: 2018-08-04 | Payer: MEDICARE | Attending: Family Medicine | Primary: Family Medicine

## 2018-08-03 DIAGNOSIS — G932 Benign intracranial hypertension: Principal | ICD-10-CM

## 2018-08-03 DIAGNOSIS — G43709 Chronic migraine without aura, not intractable, without status migrainosus: Secondary | ICD-10-CM

## 2018-08-03 MED ORDER — BUTALBITAL 25 MG-ACETAMINOPHEN 325 MG TABLET
ORAL_TABLET | 0 refills | 0 days | Status: CP
Start: 2018-08-03 — End: 2018-08-08

## 2018-08-03 MED ORDER — ACETAZOLAMIDE 250 MG TABLET
ORAL_TABLET | Freq: Two times a day (BID) | ORAL | 2 refills | 0 days | Status: CP
Start: 2018-08-03 — End: 2018-09-04

## 2018-08-04 MED ORDER — ESTRADIOL 0.5 MG TABLET
ORAL_TABLET | 5 refills | 0 days | Status: CP
Start: 2018-08-04 — End: ?

## 2018-08-06 MED ORDER — OXYCODONE-ACETAMINOPHEN 10 MG-325 MG TABLET
ORAL_TABLET | ORAL | 0 refills | 0 days | Status: CP | PRN
Start: 2018-08-06 — End: 2018-08-14

## 2018-08-14 ENCOUNTER — Encounter: Admit: 2018-08-14 | Discharge: 2018-08-15 | Payer: MEDICARE | Attending: Family Medicine | Primary: Family Medicine

## 2018-08-14 DIAGNOSIS — Z87828 Personal history of other (healed) physical injury and trauma: Principal | ICD-10-CM

## 2018-08-14 DIAGNOSIS — M549 Dorsalgia, unspecified: Secondary | ICD-10-CM

## 2018-08-15 ENCOUNTER — Encounter: Admit: 2018-08-15 | Discharge: 2018-08-16 | Payer: MEDICARE | Attending: Anesthesiology | Primary: Anesthesiology

## 2018-08-15 DIAGNOSIS — M5416 Radiculopathy, lumbar region: Secondary | ICD-10-CM

## 2018-08-15 DIAGNOSIS — M47816 Spondylosis without myelopathy or radiculopathy, lumbar region: Secondary | ICD-10-CM

## 2018-08-15 DIAGNOSIS — M7918 Myalgia, other site: Principal | ICD-10-CM

## 2018-08-15 DIAGNOSIS — M170 Bilateral primary osteoarthritis of knee: Secondary | ICD-10-CM

## 2018-08-15 DIAGNOSIS — M5417 Radiculopathy, lumbosacral region: Secondary | ICD-10-CM

## 2018-08-15 MED ORDER — CELECOXIB 200 MG CAPSULE
ORAL_CAPSULE | Freq: Two times a day (BID) | ORAL | 2 refills | 30 days | Status: CP | PRN
Start: 2018-08-15 — End: ?

## 2018-08-15 MED ORDER — GABAPENTIN 800 MG TABLET
ORAL_TABLET | Freq: Two times a day (BID) | ORAL | 2 refills | 30 days | Status: CP
Start: 2018-08-15 — End: ?

## 2018-08-15 MED ORDER — BACLOFEN 10 MG TABLET
ORAL_TABLET | Freq: Three times a day (TID) | ORAL | 2 refills | 30.00000 days | Status: CP | PRN
Start: 2018-08-15 — End: ?

## 2018-08-18 MED ORDER — FLUCONAZOLE 150 MG TABLET
ORAL_TABLET | Freq: Once | ORAL | 0 refills | 1 days | Status: CP
Start: 2018-08-18 — End: 2018-09-04

## 2018-09-01 MED ORDER — OXYCODONE-ACETAMINOPHEN 10 MG-325 MG TABLET
ORAL_TABLET | ORAL | 0 refills | 22 days | Status: CP | PRN
Start: 2018-09-01 — End: ?

## 2018-09-04 ENCOUNTER — Ambulatory Visit: Admit: 2018-09-04 | Discharge: 2018-09-05 | Payer: MEDICARE

## 2018-09-04 ENCOUNTER — Encounter: Admit: 2018-09-04 | Discharge: 2018-09-05 | Payer: MEDICARE | Attending: Family Medicine | Primary: Family Medicine

## 2018-09-04 DIAGNOSIS — Z87828 Personal history of other (healed) physical injury and trauma: Secondary | ICD-10-CM

## 2018-09-04 DIAGNOSIS — G44319 Acute post-traumatic headache, not intractable: Principal | ICD-10-CM

## 2018-09-04 DIAGNOSIS — R410 Disorientation, unspecified: Secondary | ICD-10-CM

## 2018-09-04 DIAGNOSIS — M47816 Spondylosis without myelopathy or radiculopathy, lumbar region: Secondary | ICD-10-CM

## 2018-09-14 ENCOUNTER — Encounter: Admit: 2018-09-14 | Discharge: 2018-09-15 | Payer: MEDICARE

## 2018-09-21 ENCOUNTER — Encounter: Admit: 2018-09-21 | Discharge: 2018-09-22 | Payer: MEDICARE | Attending: Gynecology | Primary: Gynecology

## 2018-09-21 DIAGNOSIS — B373 Candidiasis of vulva and vagina: Principal | ICD-10-CM

## 2018-09-21 MED ORDER — FLUCONAZOLE 150 MG TABLET
ORAL_TABLET | Freq: Once | ORAL | 0 refills | 1 days | Status: CP
Start: 2018-09-21 — End: 2018-09-21

## 2018-09-25 MED ORDER — PANTOPRAZOLE 40 MG TABLET,DELAYED RELEASE
ORAL_TABLET | 2 refills | 0 days | Status: CP
Start: 2018-09-25 — End: ?

## 2018-09-25 MED ORDER — POTASSIUM CHLORIDE ER 20 MEQ TABLET,EXTENDED RELEASE(PART/CRYST)
ORAL_TABLET | 2 refills | 0 days | Status: CP
Start: 2018-09-25 — End: ?

## 2018-09-25 MED ORDER — PROGESTERONE MICRONIZED 100 MG CAPSULE
ORAL_CAPSULE | 2 refills | 0 days | Status: CP
Start: 2018-09-25 — End: ?

## 2018-10-01 MED ORDER — OXYCODONE-ACETAMINOPHEN 10 MG-325 MG TABLET
ORAL_TABLET | ORAL | 0 refills | 22 days | Status: CP | PRN
Start: 2018-10-01 — End: 2018-09-04

## 2018-10-17 DIAGNOSIS — G932 Benign intracranial hypertension: Secondary | ICD-10-CM

## 2018-10-17 DIAGNOSIS — G43709 Chronic migraine without aura, not intractable, without status migrainosus: Secondary | ICD-10-CM

## 2018-10-18 MED ORDER — FLUCONAZOLE 150 MG TABLET
ORAL_TABLET | Freq: Once | ORAL | 0 refills | 1.00000 days | Status: CP
Start: 2018-10-18 — End: 2018-10-31

## 2018-10-20 DIAGNOSIS — R51 Headache: Secondary | ICD-10-CM

## 2018-10-20 MED ORDER — NORTRIPTYLINE 10 MG CAPSULE
ORAL_CAPSULE | 0 refills | 0 days | Status: CP
Start: 2018-10-20 — End: ?

## 2018-10-28 DIAGNOSIS — G932 Benign intracranial hypertension: Secondary | ICD-10-CM

## 2018-10-30 MED ORDER — ESTRADIOL 10 MCG VAGINAL TABLET
ORAL_TABLET | 0 refills | 0 days | Status: CP
Start: 2018-10-30 — End: ?

## 2018-10-30 MED ORDER — ACETAZOLAMIDE 250 MG TABLET
ORAL_TABLET | 2 refills | 0 days | Status: CP
Start: 2018-10-30 — End: ?

## 2018-10-31 ENCOUNTER — Encounter: Admit: 2018-10-31 | Discharge: 2018-10-31 | Payer: MEDICARE | Attending: Family Medicine | Primary: Family Medicine

## 2018-10-31 DIAGNOSIS — M546 Pain in thoracic spine: Secondary | ICD-10-CM

## 2018-10-31 DIAGNOSIS — E782 Mixed hyperlipidemia: Secondary | ICD-10-CM

## 2018-10-31 DIAGNOSIS — M542 Cervicalgia: Secondary | ICD-10-CM

## 2018-10-31 DIAGNOSIS — G8929 Other chronic pain: Secondary | ICD-10-CM

## 2018-10-31 DIAGNOSIS — M545 Low back pain: Secondary | ICD-10-CM

## 2018-10-31 MED ORDER — OXYCODONE-ACETAMINOPHEN 10 MG-325 MG TABLET
ORAL_TABLET | ORAL | 0 refills | 22.00000 days | Status: CP | PRN
Start: 2018-10-31 — End: ?

## 2018-11-03 ENCOUNTER — Encounter: Admit: 2018-11-03 | Discharge: 2018-11-04 | Payer: MEDICARE

## 2018-11-03 ENCOUNTER — Ambulatory Visit: Admit: 2018-11-03 | Discharge: 2018-11-04 | Payer: MEDICARE

## 2018-11-03 DIAGNOSIS — M545 Low back pain: Principal | ICD-10-CM

## 2018-11-03 DIAGNOSIS — G8929 Other chronic pain: Secondary | ICD-10-CM

## 2018-11-03 DIAGNOSIS — M546 Pain in thoracic spine: Principal | ICD-10-CM

## 2018-11-06 DIAGNOSIS — B373 Candidiasis of vulva and vagina: Principal | ICD-10-CM

## 2018-11-06 MED ORDER — FLUCONAZOLE 150 MG TABLET: 150 mg | tablet | Freq: Once | 0 refills | 1 days | Status: AC

## 2018-11-09 ENCOUNTER — Ambulatory Visit: Admit: 2018-11-09 | Discharge: 2018-11-10 | Payer: MEDICARE | Attending: Anesthesiology | Primary: Anesthesiology

## 2018-11-09 DIAGNOSIS — M47816 Spondylosis without myelopathy or radiculopathy, lumbar region: Principal | ICD-10-CM

## 2018-11-09 DIAGNOSIS — M5417 Radiculopathy, lumbosacral region: Principal | ICD-10-CM

## 2018-11-09 DIAGNOSIS — G894 Chronic pain syndrome: Principal | ICD-10-CM

## 2018-11-09 DIAGNOSIS — M7918 Myalgia, other site: Principal | ICD-10-CM

## 2018-11-09 DIAGNOSIS — M545 Low back pain: Principal | ICD-10-CM

## 2018-11-09 DIAGNOSIS — M170 Bilateral primary osteoarthritis of knee: Principal | ICD-10-CM

## 2018-11-09 DIAGNOSIS — M5416 Radiculopathy, lumbar region: Principal | ICD-10-CM

## 2018-11-09 MED ORDER — DICLOFENAC 1 % TOPICAL GEL: 4 g | g | Freq: Four times a day (QID) | 5 refills | 32 days | Status: AC

## 2018-11-09 MED ORDER — OXYCODONE-ACETAMINOPHEN 10 MG-325 MG TABLET: 1 | tablet | 0 refills | 23 days | Status: AC

## 2018-11-09 MED ORDER — GABAPENTIN 800 MG TABLET: 800 mg | tablet | Freq: Two times a day (BID) | 2 refills | 30 days | Status: AC

## 2018-11-09 MED ORDER — CELECOXIB 200 MG CAPSULE: 200 mg | capsule | Freq: Two times a day (BID) | 2 refills | 30 days | Status: AC

## 2018-11-09 MED ORDER — BACLOFEN 10 MG TABLET: 10 mg | tablet | Freq: Three times a day (TID) | 2 refills | 30 days | Status: AC

## 2018-11-11 DIAGNOSIS — G43109 Migraine with aura, not intractable, without status migrainosus: Principal | ICD-10-CM

## 2018-11-13 DIAGNOSIS — G43109 Migraine with aura, not intractable, without status migrainosus: Principal | ICD-10-CM

## 2018-11-13 DIAGNOSIS — H1013 Acute atopic conjunctivitis, bilateral: Principal | ICD-10-CM

## 2018-11-13 DIAGNOSIS — J302 Other seasonal allergic rhinitis: Principal | ICD-10-CM

## 2018-11-13 DIAGNOSIS — J343 Hypertrophy of nasal turbinates: Principal | ICD-10-CM

## 2018-11-13 DIAGNOSIS — J3089 Other allergic rhinitis: Principal | ICD-10-CM

## 2018-11-13 DIAGNOSIS — L299 Pruritus, unspecified: Principal | ICD-10-CM

## 2018-11-13 MED ORDER — RIZATRIPTAN 5 MG DISINTEGRATING TABLET: tablet | 2 refills | 0 days | Status: AC

## 2018-11-14 MED ORDER — OLOPATADINE 0.2 % EYE DROPS: 1 [drp] | mL | Freq: Two times a day (BID) | 10 refills | 25 days | Status: AC

## 2018-11-14 MED ORDER — FLUOCINOLONE 0.01 % SCALP OIL AND SHOWER CAP: mL | Freq: Every day | 1 refills | 0 days | Status: AC

## 2018-11-14 MED ORDER — HYDROXYZINE HCL 25 MG TABLET: tablet | 0 refills | 0 days | Status: AC

## 2018-11-14 MED ORDER — AZELASTINE 137 MCG (0.1 %) NASAL SPRAY AEROSOL: 1 | mL | Freq: Two times a day (BID) | 10 refills | 0 days | Status: AC

## 2018-11-14 MED ORDER — DESONIDE 0.05 % TOPICAL CREAM: 1 | g | Freq: Two times a day (BID) | 0 refills | 0 days | Status: AC

## 2018-11-15 DIAGNOSIS — R51 Headache: Principal | ICD-10-CM

## 2018-11-15 MED ORDER — NORTRIPTYLINE 10 MG CAPSULE: capsule | 3 refills | 0 days | Status: AC

## 2018-11-22 ENCOUNTER — Encounter: Admit: 2018-11-22 | Discharge: 2018-11-23 | Payer: MEDICARE | Attending: Anesthesiology | Primary: Anesthesiology

## 2018-11-22 ENCOUNTER — Encounter: Admit: 2018-11-22 | Discharge: 2018-11-23 | Payer: MEDICARE

## 2018-11-22 DIAGNOSIS — M5136 Other intervertebral disc degeneration, lumbar region: Principal | ICD-10-CM

## 2018-11-22 DIAGNOSIS — M7918 Myalgia, other site: Principal | ICD-10-CM

## 2018-11-25 DIAGNOSIS — E876 Hypokalemia: Principal | ICD-10-CM

## 2018-11-25 DIAGNOSIS — R072 Precordial pain: Principal | ICD-10-CM

## 2018-11-27 MED ORDER — PANTOPRAZOLE 40 MG TABLET,DELAYED RELEASE
ORAL_TABLET | 2 refills | 0 days | Status: CP
Start: 2018-11-27 — End: ?

## 2018-11-27 MED ORDER — POTASSIUM CHLORIDE ER 20 MEQ TABLET,EXTENDED RELEASE(PART/CRYST)
ORAL_TABLET | 2 refills | 0 days | Status: CP
Start: 2018-11-27 — End: ?

## 2018-12-05 ENCOUNTER — Encounter: Admit: 2018-12-05 | Discharge: 2018-12-06 | Payer: MEDICARE | Attending: Family Medicine | Primary: Family Medicine

## 2018-12-05 DIAGNOSIS — J3089 Other allergic rhinitis: Principal | ICD-10-CM

## 2018-12-05 DIAGNOSIS — R413 Other amnesia: Principal | ICD-10-CM

## 2018-12-05 DIAGNOSIS — Z113 Encounter for screening for infections with a predominantly sexual mode of transmission: Principal | ICD-10-CM

## 2018-12-05 DIAGNOSIS — Z87828 Personal history of other (healed) physical injury and trauma: Principal | ICD-10-CM

## 2018-12-05 DIAGNOSIS — N898 Other specified noninflammatory disorders of vagina: Principal | ICD-10-CM

## 2018-12-14 DIAGNOSIS — N760 Acute vaginitis: Principal | ICD-10-CM

## 2018-12-14 DIAGNOSIS — B9689 Other specified bacterial agents as the cause of diseases classified elsewhere: Principal | ICD-10-CM

## 2018-12-14 MED ORDER — METRONIDAZOLE 500 MG TABLET
ORAL_TABLET | Freq: Two times a day (BID) | ORAL | 0 refills | 7 days | Status: CP
Start: 2018-12-14 — End: 2018-12-21

## 2018-12-18 MED ORDER — FLUCONAZOLE 150 MG TABLET
ORAL_TABLET | Freq: Once | ORAL | 0 refills | 1 days | Status: CP
Start: 2018-12-18 — End: 2018-12-18

## 2019-01-03 MED ORDER — ESTRADIOL 0.5 MG TABLET
ORAL_TABLET | 5 refills | 0 days | Status: CP
Start: 2019-01-03 — End: ?

## 2019-02-06 ENCOUNTER — Telehealth: Admit: 2019-02-06 | Discharge: 2019-02-07 | Payer: MEDICARE

## 2019-02-06 DIAGNOSIS — M47816 Spondylosis without myelopathy or radiculopathy, lumbar region: Principal | ICD-10-CM

## 2019-02-06 DIAGNOSIS — M7918 Myalgia, other site: Principal | ICD-10-CM

## 2019-02-06 DIAGNOSIS — M170 Bilateral primary osteoarthritis of knee: Principal | ICD-10-CM

## 2019-02-06 DIAGNOSIS — L299 Pruritus, unspecified: Principal | ICD-10-CM

## 2019-02-06 DIAGNOSIS — M5416 Radiculopathy, lumbar region: Principal | ICD-10-CM

## 2019-02-06 DIAGNOSIS — M5417 Radiculopathy, lumbosacral region: Principal | ICD-10-CM

## 2019-02-06 MED ORDER — BACLOFEN 10 MG TABLET
ORAL_TABLET | 2 refills | 0 days | Status: CP
Start: 2019-02-06 — End: ?

## 2019-02-06 MED ORDER — HYDROXYZINE HCL 25 MG TABLET
ORAL_TABLET | 0 refills | 0 days | Status: CP
Start: 2019-02-06 — End: ?

## 2019-02-06 MED ORDER — GABAPENTIN 800 MG TABLET
ORAL_TABLET | Freq: Two times a day (BID) | ORAL | 2 refills | 30 days | Status: CP
Start: 2019-02-06 — End: ?

## 2019-02-06 MED ORDER — CELECOXIB 200 MG CAPSULE
ORAL_CAPSULE | Freq: Two times a day (BID) | ORAL | 2 refills | 30 days | Status: CP | PRN
Start: 2019-02-06 — End: ?

## 2019-02-08 DIAGNOSIS — M7918 Myalgia, other site: Principal | ICD-10-CM

## 2019-02-16 ENCOUNTER — Ambulatory Visit: Admit: 2019-02-16 | Discharge: 2019-02-17 | Payer: MEDICARE | Attending: Family Medicine | Primary: Family Medicine

## 2019-02-20 ENCOUNTER — Encounter: Admit: 2019-02-20 | Discharge: 2019-02-21 | Payer: MEDICARE

## 2019-02-20 DIAGNOSIS — N898 Other specified noninflammatory disorders of vagina: Principal | ICD-10-CM

## 2019-02-20 DIAGNOSIS — R600 Localized edema: Principal | ICD-10-CM

## 2019-02-20 DIAGNOSIS — E8941 Symptomatic postprocedural ovarian failure: Principal | ICD-10-CM

## 2019-02-20 MED ORDER — FLUCONAZOLE 150 MG TABLET
ORAL_TABLET | 3 refills | 0 days | Status: CP
Start: 2019-02-20 — End: ?

## 2019-02-20 MED ORDER — ESTRADIOL 10 MCG VAGINAL TABLET
ORAL_TABLET | 1 refills | 0 days | Status: CP
Start: 2019-02-20 — End: ?

## 2019-02-20 MED ORDER — HYDROCHLOROTHIAZIDE 12.5 MG TABLET
ORAL_TABLET | Freq: Every day | ORAL | 1 refills | 30 days | Status: CP
Start: 2019-02-20 — End: 2019-03-22

## 2019-02-20 MED ORDER — PROGESTERONE MICRONIZED 100 MG CAPSULE
ORAL_CAPSULE | Freq: Every day | ORAL | 1 refills | 90.00000 days | Status: CP
Start: 2019-02-20 — End: 2019-05-21

## 2019-02-20 MED ORDER — ESTRADIOL 0.5 MG TABLET
ORAL_TABLET | Freq: Every day | ORAL | 0 refills | 90 days | Status: CP
Start: 2019-02-20 — End: 2019-05-21

## 2019-02-27 ENCOUNTER — Encounter: Admit: 2019-02-27 | Discharge: 2019-02-28 | Payer: MEDICARE | Attending: Family Medicine | Primary: Family Medicine

## 2019-02-27 MED ORDER — OXYCODONE-ACETAMINOPHEN 10 MG-325 MG TABLET
ORAL_TABLET | ORAL | 0 refills | 23 days | Status: CP | PRN
Start: 2019-02-27 — End: ?

## 2019-03-14 DIAGNOSIS — M7918 Myalgia, other site: Principal | ICD-10-CM

## 2019-03-16 ENCOUNTER — Encounter: Admit: 2019-03-16 | Discharge: 2019-03-16 | Payer: MEDICARE

## 2019-03-16 ENCOUNTER — Encounter: Admit: 2019-03-16 | Discharge: 2019-03-16 | Payer: MEDICARE | Attending: Anesthesiology | Primary: Anesthesiology

## 2019-03-16 ENCOUNTER — Ambulatory Visit: Admit: 2019-03-16 | Discharge: 2019-03-16 | Payer: MEDICARE | Attending: Anesthesiology | Primary: Anesthesiology

## 2019-03-21 DIAGNOSIS — J3089 Other allergic rhinitis: Principal | ICD-10-CM

## 2019-03-21 DIAGNOSIS — J302 Other seasonal allergic rhinitis: Principal | ICD-10-CM

## 2019-03-21 DIAGNOSIS — J343 Hypertrophy of nasal turbinates: Principal | ICD-10-CM

## 2019-03-21 DIAGNOSIS — R51 Headache: Principal | ICD-10-CM

## 2019-03-21 MED ORDER — NORTRIPTYLINE 10 MG CAPSULE
ORAL_CAPSULE | 3 refills | 0 days | Status: CP
Start: 2019-03-21 — End: ?

## 2019-03-21 MED ORDER — FLUTICASONE PROPIONATE 50 MCG/ACTUATION NASAL SPRAY,SUSPENSION
11 refills | 0 days | Status: CP
Start: 2019-03-21 — End: ?

## 2019-03-26 DIAGNOSIS — E7800 Pure hypercholesterolemia, unspecified: Principal | ICD-10-CM

## 2019-03-28 MED ORDER — ATORVASTATIN 20 MG TABLET
ORAL_TABLET | 3 refills | 0 days | Status: CP
Start: 2019-03-28 — End: ?

## 2019-03-29 MED ORDER — OXYCODONE-ACETAMINOPHEN 10 MG-325 MG TABLET
ORAL_TABLET | ORAL | 0 refills | 23 days | Status: CP | PRN
Start: 2019-03-29 — End: ?

## 2019-04-03 ENCOUNTER — Ambulatory Visit: Admit: 2019-04-03 | Discharge: 2019-04-04 | Payer: MEDICARE | Attending: Family Medicine | Primary: Family Medicine

## 2019-04-03 DIAGNOSIS — R600 Localized edema: Principal | ICD-10-CM

## 2019-04-03 DIAGNOSIS — L299 Pruritus, unspecified: Principal | ICD-10-CM

## 2019-04-03 DIAGNOSIS — Z114 Encounter for screening for human immunodeficiency virus [HIV]: Principal | ICD-10-CM

## 2019-04-03 DIAGNOSIS — M791 Myalgia, unspecified site: Secondary | ICD-10-CM

## 2019-04-03 DIAGNOSIS — Z1159 Encounter for screening for other viral diseases: Principal | ICD-10-CM

## 2019-04-03 DIAGNOSIS — T466X5A Adverse effect of antihyperlipidemic and antiarteriosclerotic drugs, initial encounter: Secondary | ICD-10-CM

## 2019-04-03 DIAGNOSIS — Z634 Disappearance and death of family member: Principal | ICD-10-CM

## 2019-04-03 DIAGNOSIS — R413 Other amnesia: Principal | ICD-10-CM

## 2019-04-03 DIAGNOSIS — R51 Headache: Principal | ICD-10-CM

## 2019-04-03 DIAGNOSIS — E782 Mixed hyperlipidemia: Principal | ICD-10-CM

## 2019-04-03 DIAGNOSIS — E559 Vitamin D deficiency, unspecified: Principal | ICD-10-CM

## 2019-04-03 DIAGNOSIS — Z7289 Other problems related to lifestyle: Principal | ICD-10-CM

## 2019-04-03 DIAGNOSIS — R0683 Snoring: Principal | ICD-10-CM

## 2019-04-03 DIAGNOSIS — Z113 Encounter for screening for infections with a predominantly sexual mode of transmission: Principal | ICD-10-CM

## 2019-04-03 DIAGNOSIS — F1120 Opioid dependence, uncomplicated: Principal | ICD-10-CM

## 2019-04-03 DIAGNOSIS — E669 Obesity, unspecified: Principal | ICD-10-CM

## 2019-04-03 MED ORDER — HYDROXYZINE HCL 25 MG TABLET
ORAL_TABLET | 0 refills | 0 days | Status: CP
Start: 2019-04-03 — End: ?

## 2019-04-03 MED ORDER — HYDROCHLOROTHIAZIDE 12.5 MG TABLET
ORAL_TABLET | Freq: Every day | ORAL | 1 refills | 90.00000 days | Status: CP
Start: 2019-04-03 — End: 2019-05-03

## 2019-04-04 DIAGNOSIS — R7989 Other specified abnormal findings of blood chemistry: Principal | ICD-10-CM

## 2019-04-04 DIAGNOSIS — E559 Vitamin D deficiency, unspecified: Principal | ICD-10-CM

## 2019-04-04 DIAGNOSIS — E038 Other specified hypothyroidism: Principal | ICD-10-CM

## 2019-04-04 DIAGNOSIS — E0590 Thyrotoxicosis, unspecified without thyrotoxic crisis or storm: Principal | ICD-10-CM

## 2019-04-04 MED ORDER — CHOLECALCIFEROL (VITAMIN D3) 1,250 MCG (50,000 UNIT) CAPSULE
ORAL_CAPSULE | 0 refills | 0 days | Status: CP
Start: 2019-04-04 — End: ?

## 2019-04-06 DIAGNOSIS — G43109 Migraine with aura, not intractable, without status migrainosus: Principal | ICD-10-CM

## 2019-04-09 MED ORDER — RIZATRIPTAN 5 MG DISINTEGRATING TABLET
ORAL_TABLET | 2 refills | 0 days | Status: CP
Start: 2019-04-09 — End: ?

## 2019-04-16 DIAGNOSIS — E876 Hypokalemia: Principal | ICD-10-CM

## 2019-04-16 DIAGNOSIS — R072 Precordial pain: Principal | ICD-10-CM

## 2019-04-16 MED ORDER — PANTOPRAZOLE 40 MG TABLET,DELAYED RELEASE
ORAL_TABLET | 2 refills | 0 days | Status: CP
Start: 2019-04-16 — End: ?

## 2019-04-16 MED ORDER — POTASSIUM CHLORIDE ER 20 MEQ TABLET,EXTENDED RELEASE(PART/CRYST)
ORAL_TABLET | 2 refills | 0 days | Status: CP
Start: 2019-04-16 — End: ?

## 2019-04-21 DIAGNOSIS — M47816 Spondylosis without myelopathy or radiculopathy, lumbar region: Principal | ICD-10-CM

## 2019-04-21 DIAGNOSIS — M7918 Myalgia, other site: Principal | ICD-10-CM

## 2019-04-21 DIAGNOSIS — M5417 Radiculopathy, lumbosacral region: Principal | ICD-10-CM

## 2019-04-23 ENCOUNTER — Encounter: Admit: 2019-04-23 | Discharge: 2019-04-24 | Payer: MEDICARE

## 2019-04-23 MED ORDER — BACLOFEN 10 MG TABLET
ORAL_TABLET | 0 refills | 0 days | Status: CP
Start: 2019-04-23 — End: ?

## 2019-04-28 MED ORDER — OXYCODONE-ACETAMINOPHEN 10 MG-325 MG TABLET
ORAL_TABLET | ORAL | 0 refills | 23 days | Status: CP | PRN
Start: 2019-04-28 — End: ?

## 2019-05-02 ENCOUNTER — Ambulatory Visit: Admit: 2019-05-02 | Discharge: 2019-05-03 | Payer: MEDICARE | Attending: Anesthesiology | Primary: Anesthesiology

## 2019-05-02 DIAGNOSIS — M47816 Spondylosis without myelopathy or radiculopathy, lumbar region: Principal | ICD-10-CM

## 2019-05-02 DIAGNOSIS — M5417 Radiculopathy, lumbosacral region: Principal | ICD-10-CM

## 2019-05-02 DIAGNOSIS — M5416 Radiculopathy, lumbar region: Principal | ICD-10-CM

## 2019-05-02 DIAGNOSIS — M170 Bilateral primary osteoarthritis of knee: Principal | ICD-10-CM

## 2019-05-02 DIAGNOSIS — M7918 Myalgia, other site: Principal | ICD-10-CM

## 2019-05-02 MED ORDER — BACLOFEN 10 MG TABLET
ORAL_TABLET | 2 refills | 0 days | Status: CP
Start: 2019-05-02 — End: ?

## 2019-05-02 MED ORDER — DICLOFENAC 1 % TOPICAL GEL
Freq: Four times a day (QID) | TOPICAL | 5 refills | 32.00000 days | Status: CP
Start: 2019-05-02 — End: ?

## 2019-05-02 MED ORDER — CELECOXIB 200 MG CAPSULE
ORAL_CAPSULE | Freq: Two times a day (BID) | ORAL | 2 refills | 30 days | Status: CP | PRN
Start: 2019-05-02 — End: ?

## 2019-05-02 MED ORDER — GABAPENTIN 800 MG TABLET
ORAL_TABLET | Freq: Two times a day (BID) | ORAL | 2 refills | 30 days | Status: CP
Start: 2019-05-02 — End: ?

## 2019-05-04 DIAGNOSIS — R600 Localized edema: Principal | ICD-10-CM

## 2019-05-04 MED ORDER — HYDROCHLOROTHIAZIDE 12.5 MG TABLET
ORAL_TABLET | Freq: Every day | ORAL | 1 refills | 90 days | Status: CP
Start: 2019-05-04 — End: 2019-06-03

## 2019-05-08 ENCOUNTER — Encounter: Admit: 2019-05-08 | Discharge: 2019-05-09 | Payer: MEDICARE | Attending: Family Medicine | Primary: Family Medicine

## 2019-05-08 MED ORDER — PREDNISONE 10 MG TABLET
ORAL_TABLET | 0 refills | 0 days | Status: CP
Start: 2019-05-08 — End: ?

## 2019-05-14 ENCOUNTER — Encounter: Admit: 2019-05-14 | Discharge: 2019-05-15 | Payer: MEDICARE

## 2019-05-25 ENCOUNTER — Ambulatory Visit: Admit: 2019-05-25 | Discharge: 2019-05-26 | Payer: MEDICARE | Attending: Anesthesiology | Primary: Anesthesiology

## 2019-05-25 ENCOUNTER — Encounter: Admit: 2019-05-25 | Discharge: 2019-05-26 | Payer: MEDICARE

## 2019-05-25 DIAGNOSIS — M7918 Myalgia, other site: Principal | ICD-10-CM

## 2019-05-25 DIAGNOSIS — M5136 Other intervertebral disc degeneration, lumbar region: Principal | ICD-10-CM

## 2019-05-27 MED ORDER — OXYCODONE-ACETAMINOPHEN 10 MG-325 MG TABLET
ORAL_TABLET | ORAL | 0 refills | 23.00000 days | Status: CP | PRN
Start: 2019-05-27 — End: ?

## 2019-06-05 ENCOUNTER — Ambulatory Visit: Admit: 2019-06-05 | Discharge: 2019-06-04 | Payer: MEDICARE

## 2019-06-05 DIAGNOSIS — J302 Other seasonal allergic rhinitis: Principal | ICD-10-CM

## 2019-06-05 DIAGNOSIS — L282 Other prurigo: Principal | ICD-10-CM

## 2019-06-05 MED ORDER — TRIAMCINOLONE ACETONIDE 0.1 % TOPICAL CREAM
0 refills | 0 days | Status: CP
Start: 2019-06-05 — End: ?

## 2019-06-19 ENCOUNTER — Ambulatory Visit
Admit: 2019-06-19 | Discharge: 2019-06-20 | Payer: MEDICARE | Attending: Physical Medicine & Rehabilitation | Primary: Physical Medicine & Rehabilitation

## 2019-06-19 DIAGNOSIS — F0781 Postconcussional syndrome: Principal | ICD-10-CM

## 2019-06-19 DIAGNOSIS — R519 Recurrent headache: Principal | ICD-10-CM

## 2019-06-19 DIAGNOSIS — R413 Other amnesia: Principal | ICD-10-CM

## 2019-06-21 ENCOUNTER — Ambulatory Visit: Admit: 2019-06-21 | Discharge: 2019-06-22 | Payer: MEDICARE

## 2019-06-21 DIAGNOSIS — R3129 Other microscopic hematuria: Principal | ICD-10-CM

## 2019-06-21 MED ORDER — CIPROFLOXACIN 250 MG TABLET
ORAL_TABLET | Freq: Two times a day (BID) | ORAL | 0 refills | 10.00000 days | Status: CP
Start: 2019-06-21 — End: 2019-06-24

## 2019-06-26 MED ORDER — OXYCODONE-ACETAMINOPHEN 10 MG-325 MG TABLET
ORAL_TABLET | ORAL | 0 refills | 23.00000 days | Status: CP | PRN
Start: 2019-06-26 — End: ?

## 2019-06-27 ENCOUNTER — Encounter: Admit: 2019-06-27 | Discharge: 2019-06-28 | Payer: MEDICARE

## 2019-06-27 MED ORDER — TOPIRAMATE 25 MG TABLET
ORAL_TABLET | Freq: Every evening | ORAL | 2 refills | 60.00000 days | Status: CP
Start: 2019-06-27 — End: 2019-07-27

## 2019-06-27 MED ORDER — RIZATRIPTAN 10 MG TABLET
ORAL_TABLET | Freq: Once | ORAL | 2 refills | 0 days | Status: CP | PRN
Start: 2019-06-27 — End: 2020-06-27

## 2019-07-07 DIAGNOSIS — E559 Vitamin D deficiency, unspecified: Principal | ICD-10-CM

## 2019-07-09 DIAGNOSIS — J301 Allergic rhinitis due to pollen: Principal | ICD-10-CM

## 2019-07-09 DIAGNOSIS — R0789 Other chest pain: Principal | ICD-10-CM

## 2019-07-09 MED ORDER — ALBUTEROL SULFATE HFA 90 MCG/ACTUATION AEROSOL INHALER
Freq: Four times a day (QID) | RESPIRATORY_TRACT | 0 refills | 0 days | Status: CP | PRN
Start: 2019-07-09 — End: 2020-07-08

## 2019-07-10 DIAGNOSIS — R0789 Other chest pain: Principal | ICD-10-CM

## 2019-07-10 DIAGNOSIS — J301 Allergic rhinitis due to pollen: Principal | ICD-10-CM

## 2019-07-10 MED ORDER — ALBUTEROL SULFATE HFA 90 MCG/ACTUATION AEROSOL INHALER
0 refills | 0 days | Status: CP
Start: 2019-07-10 — End: ?

## 2019-07-11 ENCOUNTER — Encounter: Admit: 2019-07-11 | Discharge: 2019-07-12 | Payer: MEDICARE | Attending: Family Medicine | Primary: Family Medicine

## 2019-07-11 DIAGNOSIS — F0781 Postconcussional syndrome: Principal | ICD-10-CM

## 2019-07-11 DIAGNOSIS — R519 Recurrent headache: Principal | ICD-10-CM

## 2019-07-12 MED ORDER — KETOROLAC 10 MG TABLET
ORAL_TABLET | Freq: Three times a day (TID) | ORAL | 0 refills | 10.00000 days | Status: CP | PRN
Start: 2019-07-12 — End: ?

## 2019-07-13 ENCOUNTER — Ambulatory Visit: Admit: 2019-07-13 | Payer: MEDICARE

## 2019-07-16 DIAGNOSIS — E876 Hypokalemia: Principal | ICD-10-CM

## 2019-07-16 DIAGNOSIS — R519 Recurrent headache: Principal | ICD-10-CM

## 2019-07-16 DIAGNOSIS — R072 Precordial pain: Principal | ICD-10-CM

## 2019-07-16 MED ORDER — PANTOPRAZOLE 40 MG TABLET,DELAYED RELEASE
ORAL_TABLET | 2 refills | 0 days | Status: CP
Start: 2019-07-16 — End: ?

## 2019-07-16 MED ORDER — NORTRIPTYLINE 10 MG CAPSULE
ORAL_CAPSULE | 3 refills | 0 days | Status: CP
Start: 2019-07-16 — End: ?

## 2019-07-16 MED ORDER — POTASSIUM CHLORIDE ER 20 MEQ TABLET,EXTENDED RELEASE(PART/CRYST)
ORAL_TABLET | 2 refills | 0 days | Status: CP
Start: 2019-07-16 — End: ?

## 2019-07-25 ENCOUNTER — Ambulatory Visit: Admit: 2019-07-25 | Discharge: 2019-07-26 | Payer: MEDICARE

## 2019-07-25 DIAGNOSIS — J351 Hypertrophy of tonsils: Principal | ICD-10-CM

## 2019-07-25 DIAGNOSIS — R0683 Snoring: Principal | ICD-10-CM

## 2019-07-25 DIAGNOSIS — G4733 Obstructive sleep apnea (adult) (pediatric): Principal | ICD-10-CM

## 2019-07-25 MED ORDER — FLUTICASONE PROPIONATE 50 MCG/ACTUATION NASAL SPRAY,SUSPENSION
Freq: Every day | NASAL | 11 refills | 0 days | Status: CP
Start: 2019-07-25 — End: ?

## 2019-07-26 MED ORDER — OXYCODONE-ACETAMINOPHEN 10 MG-325 MG TABLET
ORAL_TABLET | ORAL | 0 refills | 23 days | Status: CP | PRN
Start: 2019-07-26 — End: ?

## 2019-08-01 ENCOUNTER — Encounter: Admit: 2019-08-01 | Payer: MEDICARE

## 2019-08-03 ENCOUNTER — Ambulatory Visit: Admit: 2019-08-03 | Discharge: 2019-08-04 | Payer: MEDICARE

## 2019-08-03 DIAGNOSIS — M47816 Spondylosis without myelopathy or radiculopathy, lumbar region: Principal | ICD-10-CM

## 2019-08-03 DIAGNOSIS — M5417 Radiculopathy, lumbosacral region: Principal | ICD-10-CM

## 2019-08-03 DIAGNOSIS — M7918 Myalgia, other site: Principal | ICD-10-CM

## 2019-08-03 DIAGNOSIS — M170 Bilateral primary osteoarthritis of knee: Principal | ICD-10-CM

## 2019-08-03 DIAGNOSIS — G894 Chronic pain syndrome: Principal | ICD-10-CM

## 2019-08-03 DIAGNOSIS — M5416 Radiculopathy, lumbar region: Principal | ICD-10-CM

## 2019-08-03 MED ORDER — GABAPENTIN 800 MG TABLET
ORAL_TABLET | Freq: Two times a day (BID) | ORAL | 2 refills | 30 days | Status: CP
Start: 2019-08-03 — End: ?

## 2019-08-03 MED ORDER — BACLOFEN 10 MG TABLET
ORAL_TABLET | 2 refills | 0 days | Status: CP
Start: 2019-08-03 — End: ?

## 2019-08-03 MED ORDER — CELECOXIB 200 MG CAPSULE
ORAL_CAPSULE | Freq: Two times a day (BID) | ORAL | 2 refills | 30 days | Status: CP | PRN
Start: 2019-08-03 — End: ?

## 2019-08-06 ENCOUNTER — Ambulatory Visit: Admit: 2019-08-06 | Discharge: 2019-08-07 | Payer: MEDICARE | Attending: Family Medicine | Primary: Family Medicine

## 2019-08-06 MED ORDER — PREDNISONE 10 MG TABLET
ORAL_TABLET | 0 refills | 0 days | Status: CP
Start: 2019-08-06 — End: ?

## 2019-08-06 MED ORDER — CHOLECALCIFEROL (VITAMIN D3) 1,250 MCG (50,000 UNIT) CAPSULE
ORAL_CAPSULE | 0 refills | 0 days | Status: CP
Start: 2019-08-06 — End: ?

## 2019-08-06 MED ORDER — AMOXICILLIN 500 MG CAPSULE
ORAL_CAPSULE | Freq: Three times a day (TID) | ORAL | 0 refills | 10.00000 days | Status: CP
Start: 2019-08-06 — End: 2019-08-16

## 2019-08-08 MED ORDER — FLUCONAZOLE 150 MG TABLET
ORAL_TABLET | Freq: Once | ORAL | 0 refills | 1 days | Status: CP
Start: 2019-08-08 — End: 2019-08-08

## 2019-08-10 DIAGNOSIS — E7800 Pure hypercholesterolemia, unspecified: Principal | ICD-10-CM

## 2019-08-10 MED ORDER — ATORVASTATIN 20 MG TABLET
ORAL_TABLET | 3 refills | 0 days | Status: CP
Start: 2019-08-10 — End: ?

## 2019-08-21 ENCOUNTER — Encounter: Admit: 2019-08-21 | Discharge: 2019-08-22 | Payer: MEDICARE | Attending: Family Medicine | Primary: Family Medicine

## 2019-08-21 ENCOUNTER — Encounter: Admit: 2019-08-21 | Discharge: 2019-08-22 | Payer: MEDICARE | Attending: Pain Medicine | Primary: Pain Medicine

## 2019-08-21 DIAGNOSIS — H6121 Impacted cerumen, right ear: Principal | ICD-10-CM

## 2019-08-21 DIAGNOSIS — Z1231 Encounter for screening mammogram for malignant neoplasm of breast: Principal | ICD-10-CM

## 2019-08-21 DIAGNOSIS — N6332 Unspecified lump in axillary tail of the left breast: Principal | ICD-10-CM

## 2019-08-21 MED ORDER — BACLOFEN 20 MG TABLET
ORAL_TABLET | 4 refills | 0 days | Status: CP
Start: 2019-08-21 — End: ?

## 2019-08-21 MED ORDER — TOPIRAMATE 25 MG TABLET
ORAL_TABLET | 4 refills | 0 days | Status: CP
Start: 2019-08-21 — End: ?

## 2019-08-21 MED ORDER — NORTRIPTYLINE 10 MG CAPSULE
ORAL_CAPSULE | 4 refills | 0 days | Status: CP
Start: 2019-08-21 — End: ?

## 2019-08-21 MED ORDER — CARBAMIDE PEROXIDE 6.5 % EAR DROPS
Freq: Two times a day (BID) | OTIC | 0 refills | 0.00000 days | Status: CP
Start: 2019-08-21 — End: 2020-08-20

## 2019-08-23 ENCOUNTER — Ambulatory Visit: Admit: 2019-08-23 | Discharge: 2019-08-24 | Payer: MEDICARE

## 2019-08-23 DIAGNOSIS — J029 Acute pharyngitis, unspecified: Principal | ICD-10-CM

## 2019-08-23 DIAGNOSIS — M7989 Other specified soft tissue disorders: Principal | ICD-10-CM

## 2019-08-23 DIAGNOSIS — H6121 Impacted cerumen, right ear: Principal | ICD-10-CM

## 2019-08-23 DIAGNOSIS — J020 Streptococcal pharyngitis: Principal | ICD-10-CM

## 2019-08-23 MED ORDER — CEPHALEXIN 500 MG CAPSULE
ORAL_CAPSULE | Freq: Two times a day (BID) | ORAL | 0 refills | 10 days | Status: CP
Start: 2019-08-23 — End: 2019-09-02

## 2019-08-23 MED ORDER — CARBAMIDE PEROXIDE 6.5 % EAR DROPS
Freq: Two times a day (BID) | OTIC | 0 refills | 0.00000 days | Status: CP
Start: 2019-08-23 — End: 2020-08-22

## 2019-08-25 MED ORDER — OXYCODONE-ACETAMINOPHEN 10 MG-325 MG TABLET
ORAL_TABLET | ORAL | 0 refills | 23 days | Status: CP | PRN
Start: 2019-08-25 — End: ?

## 2019-08-27 DIAGNOSIS — J029 Acute pharyngitis, unspecified: Principal | ICD-10-CM

## 2019-08-27 DIAGNOSIS — J020 Streptococcal pharyngitis: Principal | ICD-10-CM

## 2019-08-27 MED ORDER — AMOXICILLIN 500 MG CAPSULE
ORAL_CAPSULE | Freq: Three times a day (TID) | ORAL | 0 refills | 10.00000 days | Status: CP
Start: 2019-08-27 — End: 2019-09-06

## 2019-09-03 ENCOUNTER — Encounter: Admit: 2019-09-03 | Discharge: 2019-09-03 | Payer: MEDICARE | Attending: Family Medicine | Primary: Family Medicine

## 2019-09-03 ENCOUNTER — Ambulatory Visit: Admit: 2019-09-03 | Discharge: 2019-09-03 | Payer: MEDICARE

## 2019-09-03 DIAGNOSIS — G4730 Sleep apnea, unspecified: Principal | ICD-10-CM

## 2019-09-03 DIAGNOSIS — J351 Hypertrophy of tonsils: Principal | ICD-10-CM

## 2019-09-03 MED ORDER — TRIAMCINOLONE ACETONIDE 0.1 % TOPICAL CREAM
0 refills | 0 days | Status: CP
Start: 2019-09-03 — End: ?

## 2019-09-03 MED ORDER — FLUOCINOLONE 0.01 % SCALP OIL AND SHOWER CAP
Freq: Every day | TOPICAL | 1 refills | 0.00000 days | Status: CP
Start: 2019-09-03 — End: 2020-09-02

## 2019-09-03 MED ORDER — NEOMYCIN-POLYMYXIN-HYDROCORTISONE OTIC SOLN T-HOME
Freq: Four times a day (QID) | OTIC | 0 refills | 10.00000 days | Status: CP
Start: 2019-09-03 — End: 2019-09-13

## 2019-09-03 MED ORDER — MONTELUKAST 10 MG TABLET
ORAL_TABLET | Freq: Every day | ORAL | 4 refills | 30.00000 days | Status: CP
Start: 2019-09-03 — End: ?

## 2019-09-04 ENCOUNTER — Ambulatory Visit: Admit: 2019-09-04 | Discharge: 2019-09-05 | Payer: MEDICARE | Attending: Anesthesiology | Primary: Anesthesiology

## 2019-09-04 DIAGNOSIS — M542 Cervicalgia: Principal | ICD-10-CM

## 2019-09-04 DIAGNOSIS — M7918 Myalgia, other site: Principal | ICD-10-CM

## 2019-09-04 DIAGNOSIS — M170 Bilateral primary osteoarthritis of knee: Principal | ICD-10-CM

## 2019-09-04 DIAGNOSIS — M5416 Radiculopathy, lumbar region: Principal | ICD-10-CM

## 2019-09-04 DIAGNOSIS — M5417 Radiculopathy, lumbosacral region: Principal | ICD-10-CM

## 2019-09-04 DIAGNOSIS — G894 Chronic pain syndrome: Principal | ICD-10-CM

## 2019-09-04 DIAGNOSIS — M47816 Spondylosis without myelopathy or radiculopathy, lumbar region: Principal | ICD-10-CM

## 2019-09-10 ENCOUNTER — Encounter: Admit: 2019-09-10 | Discharge: 2019-09-11 | Payer: MEDICARE | Attending: Family Medicine | Primary: Family Medicine

## 2019-09-10 ENCOUNTER — Ambulatory Visit: Admit: 2019-09-10 | Discharge: 2019-09-11 | Payer: MEDICARE

## 2019-09-10 DIAGNOSIS — Z79890 Hormone replacement therapy (postmenopausal): Principal | ICD-10-CM

## 2019-09-10 DIAGNOSIS — J020 Streptococcal pharyngitis: Principal | ICD-10-CM

## 2019-09-10 DIAGNOSIS — H9202 Otalgia, left ear: Principal | ICD-10-CM

## 2019-09-10 DIAGNOSIS — J029 Acute pharyngitis, unspecified: Principal | ICD-10-CM

## 2019-09-10 DIAGNOSIS — N951 Menopausal and female climacteric states: Principal | ICD-10-CM

## 2019-09-10 DIAGNOSIS — J3489 Other specified disorders of nose and nasal sinuses: Principal | ICD-10-CM

## 2019-09-10 DIAGNOSIS — H6121 Impacted cerumen, right ear: Principal | ICD-10-CM

## 2019-09-10 MED ORDER — PROGESTERONE MICRONIZED 100 MG CAPSULE
ORAL_CAPSULE | Freq: Every day | ORAL | 0 refills | 90 days | Status: CP
Start: 2019-09-10 — End: 2020-09-09

## 2019-09-10 MED ORDER — ESTRADIOL 10 MCG VAGINAL TABLET
ORAL_TABLET | 1 refills | 0 days | Status: CP
Start: 2019-09-10 — End: ?

## 2019-09-10 MED ORDER — CLARITHROMYCIN 250 MG TABLET
ORAL_TABLET | Freq: Two times a day (BID) | ORAL | 0 refills | 10.00000 days | Status: CP
Start: 2019-09-10 — End: ?

## 2019-09-17 ENCOUNTER — Encounter: Admit: 2019-09-17 | Discharge: 2019-09-18 | Payer: MEDICARE

## 2019-09-17 ENCOUNTER — Ambulatory Visit: Admit: 2019-09-17 | Discharge: 2019-09-18 | Payer: MEDICARE

## 2019-09-17 DIAGNOSIS — H6121 Impacted cerumen, right ear: Principal | ICD-10-CM

## 2019-09-17 DIAGNOSIS — H9202 Otalgia, left ear: Principal | ICD-10-CM

## 2019-09-17 DIAGNOSIS — H9311 Tinnitus, right ear: Principal | ICD-10-CM

## 2019-09-20 ENCOUNTER — Ambulatory Visit
Admit: 2019-09-20 | Discharge: 2019-09-21 | Payer: MEDICARE | Attending: Physical Medicine & Rehabilitation | Primary: Physical Medicine & Rehabilitation

## 2019-09-20 DIAGNOSIS — R519 Recurrent headache: Principal | ICD-10-CM

## 2019-09-20 DIAGNOSIS — M7918 Myalgia, other site: Principal | ICD-10-CM

## 2019-09-20 DIAGNOSIS — R413 Other amnesia: Principal | ICD-10-CM

## 2019-09-20 DIAGNOSIS — F0781 Postconcussional syndrome: Principal | ICD-10-CM

## 2019-09-24 MED ORDER — OXYCODONE-ACETAMINOPHEN 10 MG-325 MG TABLET
ORAL_TABLET | ORAL | 0 refills | 23 days | Status: CP | PRN
Start: 2019-09-24 — End: ?

## 2019-09-25 ENCOUNTER — Ambulatory Visit: Admit: 2019-09-25 | Discharge: 2019-09-26 | Payer: MEDICARE

## 2019-09-25 DIAGNOSIS — J321 Chronic frontal sinusitis: Principal | ICD-10-CM

## 2019-09-25 DIAGNOSIS — J029 Acute pharyngitis, unspecified: Principal | ICD-10-CM

## 2019-09-25 MED ORDER — CETIRIZINE 10 MG TABLET
ORAL_TABLET | Freq: Every day | ORAL | 0 refills | 90 days | Status: CP
Start: 2019-09-25 — End: 2019-12-24

## 2019-09-25 MED ORDER — AMOXICILLIN 875 MG-POTASSIUM CLAVULANATE 125 MG TABLET
ORAL_TABLET | Freq: Two times a day (BID) | ORAL | 0 refills | 7.00000 days | Status: CP
Start: 2019-09-25 — End: 2019-10-02

## 2019-09-25 MED ORDER — METHYLPREDNISOLONE 4 MG TABLETS IN A DOSE PACK
Freq: Every day | ORAL | 0 refills | 1.00000 days | Status: CP
Start: 2019-09-25 — End: 2020-09-24

## 2019-09-25 MED ORDER — FLUCONAZOLE 150 MG TABLET
ORAL_TABLET | 3 refills | 0 days | Status: CP
Start: 2019-09-25 — End: ?

## 2019-10-05 ENCOUNTER — Encounter: Admit: 2019-10-05 | Discharge: 2019-10-06 | Payer: MEDICARE

## 2019-10-05 DIAGNOSIS — G894 Chronic pain syndrome: Principal | ICD-10-CM

## 2019-10-05 DIAGNOSIS — Z01818 Encounter for other preprocedural examination: Principal | ICD-10-CM

## 2019-10-05 DIAGNOSIS — M542 Cervicalgia: Principal | ICD-10-CM

## 2019-10-05 DIAGNOSIS — F1190 Opioid use, unspecified, uncomplicated: Principal | ICD-10-CM

## 2019-10-07 ENCOUNTER — Encounter: Admit: 2019-10-07 | Discharge: 2019-10-07 | Payer: MEDICARE

## 2019-10-07 ENCOUNTER — Ambulatory Visit: Admit: 2019-10-07 | Discharge: 2019-10-08 | Payer: MEDICARE

## 2019-10-07 DIAGNOSIS — Z01818 Encounter for other preprocedural examination: Principal | ICD-10-CM

## 2019-10-09 ENCOUNTER — Ambulatory Visit: Admit: 2019-10-09 | Discharge: 2019-10-10 | Payer: MEDICARE

## 2019-10-09 ENCOUNTER — Encounter: Admit: 2019-10-09 | Discharge: 2019-10-09 | Disposition: A | Discharge status: Home / Self Care | Payer: MEDICARE

## 2019-10-09 DIAGNOSIS — J3501 Chronic tonsillitis: Principal | ICD-10-CM

## 2019-10-09 DIAGNOSIS — S161XXA Strain of muscle, fascia and tendon at neck level, initial encounter: Principal | ICD-10-CM

## 2019-10-09 DIAGNOSIS — G4730 Sleep apnea, unspecified: Principal | ICD-10-CM

## 2019-10-09 DIAGNOSIS — S39012A Strain of muscle, fascia and tendon of lower back, initial encounter: Principal | ICD-10-CM

## 2019-10-09 DIAGNOSIS — J351 Hypertrophy of tonsils: Principal | ICD-10-CM

## 2019-10-09 MED ORDER — HYDROMORPHONE 1 MG/ML ORAL LIQUID
ORAL | 0 refills | 5.00000 days | Status: CP | PRN
Start: 2019-10-09 — End: ?

## 2019-10-09 MED ORDER — PREDNISONE 20 MG TABLET
ORAL_TABLET | 0 refills | 0 days | Status: CP
Start: 2019-10-09 — End: ?

## 2019-10-09 MED ORDER — ONDANSETRON 4 MG DISINTEGRATING TABLET
ORAL_TABLET | Freq: Four times a day (QID) | ORAL | 0 refills | 3 days | Status: CP | PRN
Start: 2019-10-09 — End: ?

## 2019-10-09 MED ORDER — CYCLOBENZAPRINE 10 MG TABLET
ORAL_TABLET | Freq: Three times a day (TID) | ORAL | 0 refills | 10 days | Status: CP | PRN
Start: 2019-10-09 — End: 2019-10-19

## 2019-10-10 ENCOUNTER — Encounter: Admit: 2019-10-10 | Discharge: 2019-10-10 | Payer: MEDICARE | Attending: Anesthesiology | Primary: Anesthesiology

## 2019-10-10 ENCOUNTER — Encounter: Admit: 2019-10-10 | Discharge: 2019-10-10 | Payer: MEDICARE

## 2019-10-10 DIAGNOSIS — R072 Precordial pain: Principal | ICD-10-CM

## 2019-10-10 DIAGNOSIS — E876 Hypokalemia: Principal | ICD-10-CM

## 2019-10-10 MED ORDER — POTASSIUM CHLORIDE ER 20 MEQ TABLET,EXTENDED RELEASE(PART/CRYST)
ORAL_TABLET | 4 refills | 0 days | Status: CP
Start: 2019-10-10 — End: ?

## 2019-10-10 MED ORDER — PANTOPRAZOLE 40 MG TABLET,DELAYED RELEASE
ORAL_TABLET | 4 refills | 0 days | Status: CP
Start: 2019-10-10 — End: ?

## 2019-10-15 DIAGNOSIS — M7918 Myalgia, other site: Principal | ICD-10-CM

## 2019-10-15 DIAGNOSIS — M5417 Radiculopathy, lumbosacral region: Principal | ICD-10-CM

## 2019-10-15 DIAGNOSIS — R519 Recurrent headache: Principal | ICD-10-CM

## 2019-10-15 DIAGNOSIS — M170 Bilateral primary osteoarthritis of knee: Principal | ICD-10-CM

## 2019-10-15 DIAGNOSIS — M5416 Radiculopathy, lumbar region: Principal | ICD-10-CM

## 2019-10-15 DIAGNOSIS — M47816 Spondylosis without myelopathy or radiculopathy, lumbar region: Principal | ICD-10-CM

## 2019-10-15 MED ORDER — GABAPENTIN 800 MG TABLET
ORAL_TABLET | Freq: Two times a day (BID) | ORAL | 2 refills | 30.00000 days | Status: CP
Start: 2019-10-15 — End: ?

## 2019-10-15 MED ORDER — NORTRIPTYLINE 10 MG CAPSULE
ORAL_CAPSULE | 4 refills | 0 days | Status: CP
Start: 2019-10-15 — End: ?

## 2019-10-17 DIAGNOSIS — J301 Allergic rhinitis due to pollen: Principal | ICD-10-CM

## 2019-10-17 DIAGNOSIS — M47816 Spondylosis without myelopathy or radiculopathy, lumbar region: Principal | ICD-10-CM

## 2019-10-17 DIAGNOSIS — M5417 Radiculopathy, lumbosacral region: Principal | ICD-10-CM

## 2019-10-17 DIAGNOSIS — L282 Other prurigo: Principal | ICD-10-CM

## 2019-10-17 DIAGNOSIS — M170 Bilateral primary osteoarthritis of knee: Principal | ICD-10-CM

## 2019-10-17 DIAGNOSIS — J302 Other seasonal allergic rhinitis: Principal | ICD-10-CM

## 2019-10-17 DIAGNOSIS — R0789 Other chest pain: Principal | ICD-10-CM

## 2019-10-17 DIAGNOSIS — M5416 Radiculopathy, lumbar region: Principal | ICD-10-CM

## 2019-10-17 DIAGNOSIS — M7918 Myalgia, other site: Principal | ICD-10-CM

## 2019-10-17 MED ORDER — TRIAMCINOLONE ACETONIDE 0.1 % TOPICAL CREAM
0 refills | 0 days | Status: CP
Start: 2019-10-17 — End: ?

## 2019-10-17 MED ORDER — FLUTICASONE PROPIONATE 50 MCG/ACTUATION NASAL SPRAY,SUSPENSION
Freq: Every day | NASAL | 11 refills | 0 days | Status: CP
Start: 2019-10-17 — End: ?

## 2019-10-18 MED ORDER — ALBUTEROL SULFATE HFA 90 MCG/ACTUATION AEROSOL INHALER
RESPIRATORY_TRACT | 0 refills | 0.00000 days | Status: CP | PRN
Start: 2019-10-18 — End: 2020-01-16

## 2019-10-19 ENCOUNTER — Ambulatory Visit: Admit: 2019-10-19 | Payer: MEDICARE

## 2019-10-19 DIAGNOSIS — J3501 Chronic tonsillitis: Principal | ICD-10-CM

## 2019-10-19 MED ORDER — HYDROMORPHONE 1 MG/ML ORAL LIQUID
ORAL | 0 refills | 3 days | Status: CP | PRN
Start: 2019-10-19 — End: ?

## 2019-10-19 MED ORDER — AZITHROMYCIN 250 MG TABLET
ORAL_TABLET | 0 refills | 0 days | Status: CP
Start: 2019-10-19 — End: ?

## 2019-10-22 ENCOUNTER — Ambulatory Visit: Admit: 2019-10-22 | Payer: MEDICARE | Attending: Family Medicine | Primary: Family Medicine

## 2019-10-24 ENCOUNTER — Encounter: Admit: 2019-10-24 | Payer: MEDICARE

## 2019-10-26 ENCOUNTER — Encounter: Admit: 2019-10-26 | Discharge: 2019-10-26 | Payer: MEDICARE

## 2019-10-26 ENCOUNTER — Telehealth: Admit: 2019-10-26 | Discharge: 2019-10-27 | Payer: MEDICARE

## 2019-10-26 DIAGNOSIS — J3501 Chronic tonsillitis: Principal | ICD-10-CM

## 2019-10-26 DIAGNOSIS — M5416 Radiculopathy, lumbar region: Principal | ICD-10-CM

## 2019-10-26 DIAGNOSIS — F1190 Opioid use, unspecified, uncomplicated: Principal | ICD-10-CM

## 2019-10-26 DIAGNOSIS — G894 Chronic pain syndrome: Principal | ICD-10-CM

## 2019-10-26 MED ORDER — AZITHROMYCIN 250 MG TABLET
ORAL_TABLET | 0 refills | 0 days | Status: CP
Start: 2019-10-26 — End: ?

## 2019-10-27 MED ORDER — OXYCODONE-ACETAMINOPHEN 10 MG-325 MG TABLET
ORAL_TABLET | Freq: Three times a day (TID) | ORAL | 0 refills | 10 days | Status: CP | PRN
Start: 2019-10-27 — End: 2019-11-06

## 2019-10-29 ENCOUNTER — Encounter: Admit: 2019-10-29 | Payer: MEDICARE

## 2019-10-30 ENCOUNTER — Encounter: Admit: 2019-10-30 | Discharge: 2019-10-31 | Payer: MEDICARE

## 2019-10-30 DIAGNOSIS — R350 Frequency of micturition: Principal | ICD-10-CM

## 2019-10-30 DIAGNOSIS — M7918 Myalgia, other site: Principal | ICD-10-CM

## 2019-10-30 DIAGNOSIS — R42 Dizziness and giddiness: Principal | ICD-10-CM

## 2019-10-30 DIAGNOSIS — M5417 Radiculopathy, lumbosacral region: Principal | ICD-10-CM

## 2019-10-30 DIAGNOSIS — M47816 Spondylosis without myelopathy or radiculopathy, lumbar region: Principal | ICD-10-CM

## 2019-11-05 ENCOUNTER — Institutional Professional Consult (permissible substitution): Admit: 2019-11-05 | Discharge: 2019-11-06 | Payer: MEDICARE

## 2019-11-05 DIAGNOSIS — Z23 Encounter for immunization: Principal | ICD-10-CM

## 2019-11-06 ENCOUNTER — Ambulatory Visit: Admit: 2019-11-06 | Discharge: 2019-11-07 | Payer: MEDICARE | Attending: Anesthesiology | Primary: Anesthesiology

## 2019-11-06 DIAGNOSIS — M5416 Radiculopathy, lumbar region: Principal | ICD-10-CM

## 2019-11-06 DIAGNOSIS — M170 Bilateral primary osteoarthritis of knee: Principal | ICD-10-CM

## 2019-11-06 DIAGNOSIS — G43009 Migraine without aura, not intractable, without status migrainosus: Principal | ICD-10-CM

## 2019-11-06 DIAGNOSIS — R519 Recurrent headache: Principal | ICD-10-CM

## 2019-11-06 DIAGNOSIS — F0781 Postconcussional syndrome: Principal | ICD-10-CM

## 2019-11-06 DIAGNOSIS — G894 Chronic pain syndrome: Principal | ICD-10-CM

## 2019-11-06 DIAGNOSIS — M47816 Spondylosis without myelopathy or radiculopathy, lumbar region: Principal | ICD-10-CM

## 2019-11-06 DIAGNOSIS — F1190 Opioid use, unspecified, uncomplicated: Principal | ICD-10-CM

## 2019-11-06 MED ORDER — OXYCODONE-ACETAMINOPHEN 10 MG-325 MG TABLET
ORAL_TABLET | ORAL | 0 refills | 0.00000 days | Status: CP
Start: 2019-11-06 — End: 2019-11-06

## 2019-11-06 MED ORDER — BUPRENORPHINE HCL 150 MCG BUCCAL FILM: 150 ug | Film | Freq: Two times a day (BID) | 0 refills | 30 days | Status: AC

## 2019-11-06 MED ORDER — OXYCODONE-ACETAMINOPHEN 10 MG-325 MG TABLET: tablet | 0 refills | 0 days | Status: AC

## 2019-11-06 MED ORDER — RIZATRIPTAN 10 MG TABLET
ORAL_TABLET | Freq: Once | ORAL | 5 refills | 0.00000 days | Status: CP | PRN
Start: 2019-11-06 — End: 2020-11-06

## 2019-11-06 MED ORDER — BUPRENORPHINE HCL 150 MCG BUCCAL FILM
ORAL_FILM | Freq: Two times a day (BID) | BUCCAL | 0 refills | 30.00000 days | Status: CP
Start: 2019-11-06 — End: 2019-11-06

## 2019-11-15 ENCOUNTER — Encounter: Admit: 2019-11-15 | Discharge: 2019-11-16 | Payer: MEDICARE

## 2019-11-15 DIAGNOSIS — F1190 Opioid use, unspecified, uncomplicated: Principal | ICD-10-CM

## 2019-11-15 DIAGNOSIS — G894 Chronic pain syndrome: Principal | ICD-10-CM

## 2019-11-15 MED ORDER — TAPENTADOL 50 MG TABLET
ORAL_TABLET | Freq: Three times a day (TID) | ORAL | 0 refills | 30 days | Status: CP | PRN
Start: 2019-11-15 — End: ?

## 2019-11-20 ENCOUNTER — Ambulatory Visit: Admit: 2019-11-20 | Discharge: 2019-11-20 | Payer: MEDICARE

## 2019-11-20 ENCOUNTER — Encounter: Admit: 2019-11-20 | Discharge: 2019-11-21 | Payer: MEDICARE | Attending: Pain Medicine | Primary: Pain Medicine

## 2019-11-20 ENCOUNTER — Encounter: Admit: 2019-11-20 | Discharge: 2019-11-20 | Payer: MEDICARE

## 2019-11-20 DIAGNOSIS — J301 Allergic rhinitis due to pollen: Principal | ICD-10-CM

## 2019-11-20 DIAGNOSIS — J0101 Acute recurrent maxillary sinusitis: Principal | ICD-10-CM

## 2019-11-20 MED ORDER — CETIRIZINE 10 MG TABLET
ORAL_TABLET | Freq: Every day | ORAL | 0 refills | 90 days | Status: CP
Start: 2019-11-20 — End: 2020-02-18

## 2019-11-20 MED ORDER — NORTRIPTYLINE 25 MG CAPSULE
ORAL_CAPSULE | 5 refills | 0 days | Status: CP
Start: 2019-11-20 — End: ?

## 2019-11-20 MED ORDER — BACLOFEN 20 MG TABLET
ORAL_TABLET | Freq: Three times a day (TID) | ORAL | 5 refills | 30 days | Status: CP
Start: 2019-11-20 — End: ?

## 2019-11-20 MED ORDER — AZITHROMYCIN 250 MG TABLET
ORAL_TABLET | 0 refills | 0 days | Status: CP
Start: 2019-11-20 — End: ?

## 2019-11-26 ENCOUNTER — Encounter: Admit: 2019-11-26 | Discharge: 2019-11-27 | Payer: MEDICARE

## 2019-11-26 ENCOUNTER — Encounter: Admit: 2019-11-26 | Discharge: 2019-11-27 | Payer: MEDICARE | Attending: Anesthesiology | Primary: Anesthesiology

## 2019-11-26 DIAGNOSIS — M545 Low back pain of over 3 months duration: Principal | ICD-10-CM

## 2019-11-26 DIAGNOSIS — K219 Gastro-esophageal reflux disease without esophagitis: Principal | ICD-10-CM

## 2019-11-26 DIAGNOSIS — G932 Benign intracranial hypertension: Principal | ICD-10-CM

## 2019-11-26 DIAGNOSIS — F329 Major depressive disorder, single episode, unspecified: Principal | ICD-10-CM

## 2019-11-26 DIAGNOSIS — Z885 Allergy status to narcotic agent status: Principal | ICD-10-CM

## 2019-11-26 DIAGNOSIS — M7918 Myalgia, other site: Principal | ICD-10-CM

## 2019-11-26 DIAGNOSIS — M25512 Pain in left shoulder: Principal | ICD-10-CM

## 2019-11-26 DIAGNOSIS — M5417 Radiculopathy, lumbosacral region: Principal | ICD-10-CM

## 2019-11-26 DIAGNOSIS — M546 Pain in thoracic spine: Principal | ICD-10-CM

## 2019-11-26 DIAGNOSIS — M25562 Pain in left knee: Principal | ICD-10-CM

## 2019-11-26 DIAGNOSIS — Z780 Asymptomatic menopausal state: Principal | ICD-10-CM

## 2019-11-26 DIAGNOSIS — E039 Hypothyroidism, unspecified: Principal | ICD-10-CM

## 2019-11-26 DIAGNOSIS — M25511 Pain in right shoulder: Principal | ICD-10-CM

## 2019-11-26 DIAGNOSIS — M5136 Other intervertebral disc degeneration, lumbar region: Principal | ICD-10-CM

## 2019-11-26 DIAGNOSIS — M170 Bilateral primary osteoarthritis of knee: Principal | ICD-10-CM

## 2019-11-26 DIAGNOSIS — Z6830 Body mass index (BMI) 30.0-30.9, adult: Principal | ICD-10-CM

## 2019-11-26 DIAGNOSIS — G709 Myoneural disorder, unspecified: Principal | ICD-10-CM

## 2019-11-26 DIAGNOSIS — E669 Obesity, unspecified: Principal | ICD-10-CM

## 2019-11-26 DIAGNOSIS — R293 Abnormal posture: Principal | ICD-10-CM

## 2019-11-26 DIAGNOSIS — M25561 Pain in right knee: Principal | ICD-10-CM

## 2019-11-26 DIAGNOSIS — Z79899 Other long term (current) drug therapy: Principal | ICD-10-CM

## 2019-11-26 DIAGNOSIS — M5416 Radiculopathy, lumbar region: Principal | ICD-10-CM

## 2019-11-26 DIAGNOSIS — Z981 Arthrodesis status: Principal | ICD-10-CM

## 2019-11-26 DIAGNOSIS — G8929 Other chronic pain: Principal | ICD-10-CM

## 2019-11-26 DIAGNOSIS — M542 Cervicalgia: Principal | ICD-10-CM

## 2019-11-26 DIAGNOSIS — Z79890 Hormone replacement therapy (postmenopausal): Principal | ICD-10-CM

## 2019-11-26 DIAGNOSIS — D573 Sickle-cell trait: Principal | ICD-10-CM

## 2019-11-26 DIAGNOSIS — G894 Chronic pain syndrome: Principal | ICD-10-CM

## 2019-11-26 MED ORDER — OXYCODONE-ACETAMINOPHEN 10 MG-325 MG TABLET
ORAL_TABLET | Freq: Three times a day (TID) | ORAL | 0 refills | 30 days | Status: CP | PRN
Start: 2019-11-26 — End: 2019-12-24

## 2019-11-28 ENCOUNTER — Ambulatory Visit: Admit: 2019-11-28 | Discharge: 2019-11-29 | Payer: MEDICARE | Attending: Anesthesiology | Primary: Anesthesiology

## 2019-11-28 DIAGNOSIS — G894 Chronic pain syndrome: Principal | ICD-10-CM

## 2019-11-28 DIAGNOSIS — M7918 Myalgia, other site: Principal | ICD-10-CM

## 2019-11-28 DIAGNOSIS — M545 Low back pain of over 3 months duration: Principal | ICD-10-CM

## 2019-11-28 DIAGNOSIS — M542 Cervicalgia: Principal | ICD-10-CM

## 2019-11-29 ENCOUNTER — Institutional Professional Consult (permissible substitution): Admit: 2019-11-29 | Discharge: 2019-11-29 | Payer: MEDICARE | Attending: Anesthesiology | Primary: Anesthesiology

## 2019-11-29 DIAGNOSIS — Z760 Encounter for issue of repeat prescription: Principal | ICD-10-CM

## 2019-12-03 DIAGNOSIS — N951 Menopausal and female climacteric states: Principal | ICD-10-CM

## 2019-12-03 DIAGNOSIS — Z79890 Hormone replacement therapy (postmenopausal): Principal | ICD-10-CM

## 2019-12-03 MED ORDER — ESTRADIOL 10 MCG VAGINAL TABLET
ORAL_TABLET | 0 refills | 0 days | Status: CP
Start: 2019-12-03 — End: ?

## 2019-12-04 DIAGNOSIS — Z79890 Hormone replacement therapy (postmenopausal): Principal | ICD-10-CM

## 2019-12-04 DIAGNOSIS — N951 Menopausal and female climacteric states: Principal | ICD-10-CM

## 2019-12-07 DIAGNOSIS — R0789 Other chest pain: Principal | ICD-10-CM

## 2019-12-07 DIAGNOSIS — E2839 Other primary ovarian failure: Principal | ICD-10-CM

## 2019-12-07 DIAGNOSIS — J301 Allergic rhinitis due to pollen: Principal | ICD-10-CM

## 2019-12-07 DIAGNOSIS — N951 Menopausal and female climacteric states: Principal | ICD-10-CM

## 2019-12-07 DIAGNOSIS — Z79890 Hormone replacement therapy (postmenopausal): Principal | ICD-10-CM

## 2019-12-07 MED ORDER — PROGESTERONE MICRONIZED 100 MG CAPSULE
ORAL_CAPSULE | Freq: Every day | ORAL | 0 refills | 90.00000 days | Status: CP
Start: 2019-12-07 — End: 2020-01-10

## 2019-12-07 MED ORDER — ESTRADIOL 0.5 MG TABLET
ORAL_TABLET | Freq: Every day | ORAL | 0 refills | 30 days | Status: CP
Start: 2019-12-07 — End: 2020-03-06

## 2019-12-11 DIAGNOSIS — N951 Menopausal and female climacteric states: Principal | ICD-10-CM

## 2019-12-11 DIAGNOSIS — Z79890 Hormone replacement therapy (postmenopausal): Principal | ICD-10-CM

## 2019-12-12 ENCOUNTER — Encounter: Admit: 2019-12-12 | Discharge: 2019-12-13 | Payer: MEDICARE | Attending: Psychologist | Primary: Psychologist

## 2019-12-12 DIAGNOSIS — G894 Chronic pain syndrome: Principal | ICD-10-CM

## 2019-12-12 DIAGNOSIS — F1190 Opioid use, unspecified, uncomplicated: Principal | ICD-10-CM

## 2019-12-12 DIAGNOSIS — F419 Anxiety disorder, unspecified: Principal | ICD-10-CM

## 2019-12-15 DIAGNOSIS — E7800 Pure hypercholesterolemia, unspecified: Principal | ICD-10-CM

## 2019-12-17 ENCOUNTER — Encounter: Admit: 2019-12-17 | Discharge: 2019-12-18 | Payer: MEDICARE

## 2019-12-17 ENCOUNTER — Encounter: Admit: 2019-12-17 | Discharge: 2019-12-18 | Payer: MEDICARE | Attending: Anesthesiology | Primary: Anesthesiology

## 2019-12-17 DIAGNOSIS — Z981 Arthrodesis status: Principal | ICD-10-CM

## 2019-12-17 DIAGNOSIS — M7918 Myalgia, other site: Principal | ICD-10-CM

## 2019-12-17 DIAGNOSIS — M170 Bilateral primary osteoarthritis of knee: Principal | ICD-10-CM

## 2019-12-17 DIAGNOSIS — G894 Chronic pain syndrome: Principal | ICD-10-CM

## 2019-12-17 DIAGNOSIS — M545 Low back pain of over 3 months duration: Principal | ICD-10-CM

## 2019-12-17 DIAGNOSIS — M542 Cervicalgia: Principal | ICD-10-CM

## 2019-12-17 MED ORDER — ATORVASTATIN 20 MG TABLET
ORAL_TABLET | 3 refills | 0 days | Status: CP
Start: 2019-12-17 — End: ?

## 2019-12-24 ENCOUNTER — Encounter
Admit: 2019-12-24 | Discharge: 2019-12-24 | Payer: MEDICARE | Attending: Physical Medicine & Rehabilitation | Primary: Physical Medicine & Rehabilitation

## 2019-12-24 ENCOUNTER — Encounter: Admit: 2019-12-24 | Discharge: 2019-12-24 | Payer: MEDICARE

## 2019-12-24 DIAGNOSIS — G894 Chronic pain syndrome: Principal | ICD-10-CM

## 2019-12-24 DIAGNOSIS — F0781 Postconcussional syndrome: Principal | ICD-10-CM

## 2019-12-24 DIAGNOSIS — R519 Headache, unspecified: Principal | ICD-10-CM

## 2019-12-24 DIAGNOSIS — F1190 Opioid use, unspecified, uncomplicated: Principal | ICD-10-CM

## 2019-12-24 DIAGNOSIS — M7918 Myalgia, other site: Principal | ICD-10-CM

## 2019-12-24 DIAGNOSIS — M5417 Radiculopathy, lumbosacral region: Principal | ICD-10-CM

## 2019-12-24 DIAGNOSIS — R413 Other amnesia: Principal | ICD-10-CM

## 2019-12-24 DIAGNOSIS — G44309 Post-traumatic headache, unspecified, not intractable: Principal | ICD-10-CM

## 2019-12-26 MED ORDER — OXYCODONE-ACETAMINOPHEN 10 MG-325 MG TABLET
ORAL_TABLET | Freq: Three times a day (TID) | ORAL | 0 refills | 30.00000 days | Status: CP | PRN
Start: 2019-12-26 — End: 2020-01-25

## 2019-12-30 DIAGNOSIS — Z79890 Hormone replacement therapy (postmenopausal): Principal | ICD-10-CM

## 2019-12-30 DIAGNOSIS — N951 Menopausal and female climacteric states: Principal | ICD-10-CM

## 2019-12-30 DIAGNOSIS — E2839 Other primary ovarian failure: Principal | ICD-10-CM

## 2020-01-09 ENCOUNTER — Encounter: Admit: 2020-01-09 | Discharge: 2020-01-10 | Payer: MEDICARE

## 2020-01-09 DIAGNOSIS — R400 Somnolence: Principal | ICD-10-CM

## 2020-01-09 DIAGNOSIS — E559 Vitamin D deficiency, unspecified: Principal | ICD-10-CM

## 2020-01-09 DIAGNOSIS — G4730 Sleep apnea, unspecified: Principal | ICD-10-CM

## 2020-01-09 DIAGNOSIS — R5383 Other fatigue: Principal | ICD-10-CM

## 2020-01-09 DIAGNOSIS — H9313 Tinnitus, bilateral: Principal | ICD-10-CM

## 2020-01-09 DIAGNOSIS — J320 Chronic maxillary sinusitis: Principal | ICD-10-CM

## 2020-01-09 DIAGNOSIS — G43009 Migraine without aura, not intractable, without status migrainosus: Principal | ICD-10-CM

## 2020-01-09 MED ORDER — LEVOFLOXACIN 500 MG TABLET
ORAL_TABLET | Freq: Every day | ORAL | 0 refills | 7 days | Status: CP
Start: 2020-01-09 — End: 2020-01-16

## 2020-01-11 ENCOUNTER — Encounter: Admit: 2020-01-11 | Discharge: 2020-01-12 | Payer: MEDICARE | Attending: Psychologist | Primary: Psychologist

## 2020-01-11 DIAGNOSIS — J0101 Acute recurrent maxillary sinusitis: Principal | ICD-10-CM

## 2020-01-11 DIAGNOSIS — G894 Chronic pain syndrome: Principal | ICD-10-CM

## 2020-01-11 DIAGNOSIS — R7989 Other specified abnormal findings of blood chemistry: Principal | ICD-10-CM

## 2020-01-11 DIAGNOSIS — M5417 Radiculopathy, lumbosacral region: Principal | ICD-10-CM

## 2020-01-11 DIAGNOSIS — F419 Anxiety disorder, unspecified: Principal | ICD-10-CM

## 2020-01-11 MED ORDER — CETIRIZINE 10 MG TABLET
ORAL_TABLET | Freq: Every day | ORAL | 1 refills | 90.00000 days | Status: CP
Start: 2020-01-11 — End: 2020-02-29

## 2020-01-25 MED ORDER — OXYCODONE-ACETAMINOPHEN 10 MG-325 MG TABLET: 1 | tablet | Freq: Three times a day (TID) | 0 refills | 30 days | Status: AC

## 2020-01-25 MED ORDER — OXYCODONE-ACETAMINOPHEN 10 MG-325 MG TABLET
ORAL_TABLET | Freq: Three times a day (TID) | ORAL | 0 refills | 30.00000 days | Status: CP | PRN
Start: 2020-01-25 — End: 2020-01-31

## 2020-01-31 ENCOUNTER — Encounter: Admit: 2020-01-31 | Discharge: 2020-01-31 | Payer: MEDICARE | Attending: Anesthesiology | Primary: Anesthesiology

## 2020-01-31 ENCOUNTER — Encounter: Admit: 2020-01-31 | Discharge: 2020-01-31 | Payer: MEDICARE

## 2020-01-31 ENCOUNTER — Telehealth: Admit: 2020-01-31 | Discharge: 2020-02-01 | Payer: MEDICARE

## 2020-01-31 DIAGNOSIS — M5417 Radiculopathy, lumbosacral region: Principal | ICD-10-CM

## 2020-01-31 DIAGNOSIS — F1190 Opioid use, unspecified, uncomplicated: Principal | ICD-10-CM

## 2020-01-31 DIAGNOSIS — G894 Chronic pain syndrome: Principal | ICD-10-CM

## 2020-01-31 DIAGNOSIS — M7918 Myalgia, other site: Principal | ICD-10-CM

## 2020-01-31 MED ORDER — BACLOFEN 20 MG TABLET
ORAL_TABLET | Freq: Three times a day (TID) | ORAL | 2 refills | 35.00000 days | Status: CP | PRN
Start: 2020-01-31 — End: 2020-03-04

## 2020-01-31 MED ORDER — GABAPENTIN 800 MG TABLET
ORAL_TABLET | Freq: Two times a day (BID) | ORAL | 2 refills | 30 days | Status: CP
Start: 2020-01-31 — End: ?

## 2020-02-07 ENCOUNTER — Ambulatory Visit: Admit: 2020-02-07 | Discharge: 2020-02-08 | Payer: MEDICARE

## 2020-02-07 DIAGNOSIS — J019 Acute sinusitis, unspecified: Principal | ICD-10-CM

## 2020-02-07 DIAGNOSIS — J309 Allergic rhinitis, unspecified: Principal | ICD-10-CM

## 2020-02-07 DIAGNOSIS — H9313 Tinnitus, bilateral: Principal | ICD-10-CM

## 2020-02-07 MED ORDER — LEVOFLOXACIN 500 MG TABLET
ORAL_TABLET | Freq: Every day | ORAL | 0 refills | 14 days | Status: CP
Start: 2020-02-07 — End: 2020-02-21

## 2020-02-22 ENCOUNTER — Encounter: Admit: 2020-02-22 | Discharge: 2020-02-23 | Payer: MEDICARE | Attending: Psychologist | Primary: Psychologist

## 2020-02-22 DIAGNOSIS — F1190 Opioid use, unspecified, uncomplicated: Principal | ICD-10-CM

## 2020-02-22 DIAGNOSIS — F419 Anxiety disorder, unspecified: Principal | ICD-10-CM

## 2020-02-24 MED ORDER — OXYCODONE-ACETAMINOPHEN 10 MG-325 MG TABLET
ORAL_TABLET | Freq: Three times a day (TID) | ORAL | 0 refills | 30.00000 days | Status: CP | PRN
Start: 2020-02-24 — End: 2020-03-03

## 2020-02-29 DIAGNOSIS — L282 Other prurigo: Principal | ICD-10-CM

## 2020-02-29 DIAGNOSIS — J302 Other seasonal allergic rhinitis: Principal | ICD-10-CM

## 2020-02-29 DIAGNOSIS — H1013 Acute atopic conjunctivitis, bilateral: Principal | ICD-10-CM

## 2020-02-29 DIAGNOSIS — J0101 Acute recurrent maxillary sinusitis: Principal | ICD-10-CM

## 2020-02-29 MED ORDER — CETIRIZINE 10 MG TABLET
ORAL_TABLET | Freq: Every day | ORAL | 1 refills | 90 days | Status: CP
Start: 2020-02-29 — End: 2020-05-29

## 2020-02-29 MED ORDER — OLOPATADINE 0.2 % EYE DROPS
Freq: Two times a day (BID) | OPHTHALMIC | 3 refills | 25 days | Status: CP | PRN
Start: 2020-02-29 — End: ?

## 2020-02-29 MED ORDER — TRIAMCINOLONE ACETONIDE 0.1 % TOPICAL CREAM
0 refills | 0 days | Status: CP
Start: 2020-02-29 — End: ?

## 2020-03-04 ENCOUNTER — Encounter: Admit: 2020-03-04 | Discharge: 2020-03-05 | Payer: MEDICARE

## 2020-03-04 DIAGNOSIS — M5417 Radiculopathy, lumbosacral region: Principal | ICD-10-CM

## 2020-03-04 DIAGNOSIS — F1190 Opioid use, unspecified, uncomplicated: Principal | ICD-10-CM

## 2020-03-04 DIAGNOSIS — G894 Chronic pain syndrome: Principal | ICD-10-CM

## 2020-03-04 DIAGNOSIS — M5416 Radiculopathy, lumbar region: Principal | ICD-10-CM

## 2020-03-04 DIAGNOSIS — M7918 Myalgia, other site: Principal | ICD-10-CM

## 2020-03-04 MED ORDER — BACLOFEN 20 MG TABLET
ORAL_TABLET | Freq: Three times a day (TID) | ORAL | 2 refills | 35 days | Status: CP | PRN
Start: 2020-03-04 — End: 2020-03-05

## 2020-03-05 ENCOUNTER — Ambulatory Visit: Admit: 2020-03-05 | Discharge: 2020-03-05 | Payer: MEDICARE | Attending: Anesthesiology | Primary: Anesthesiology

## 2020-03-05 ENCOUNTER — Encounter: Admit: 2020-03-05 | Discharge: 2020-03-05 | Payer: MEDICARE

## 2020-03-05 DIAGNOSIS — Z6830 Body mass index (BMI) 30.0-30.9, adult: Principal | ICD-10-CM

## 2020-03-05 DIAGNOSIS — E039 Hypothyroidism, unspecified: Principal | ICD-10-CM

## 2020-03-05 DIAGNOSIS — Z885 Allergy status to narcotic agent status: Principal | ICD-10-CM

## 2020-03-05 DIAGNOSIS — M170 Bilateral primary osteoarthritis of knee: Principal | ICD-10-CM

## 2020-03-05 DIAGNOSIS — F32A Depression, unspecified: Principal | ICD-10-CM

## 2020-03-05 DIAGNOSIS — D573 Sickle-cell trait: Principal | ICD-10-CM

## 2020-03-05 DIAGNOSIS — Z981 Arthrodesis status: Principal | ICD-10-CM

## 2020-03-05 DIAGNOSIS — G932 Benign intracranial hypertension: Principal | ICD-10-CM

## 2020-03-05 DIAGNOSIS — Z79890 Hormone replacement therapy (postmenopausal): Principal | ICD-10-CM

## 2020-03-05 DIAGNOSIS — Z79899 Other long term (current) drug therapy: Principal | ICD-10-CM

## 2020-03-05 DIAGNOSIS — M7918 Myalgia, other site: Principal | ICD-10-CM

## 2020-03-05 DIAGNOSIS — M533 Sacrococcygeal disorders, not elsewhere classified: Principal | ICD-10-CM

## 2020-03-05 DIAGNOSIS — G709 Myoneural disorder, unspecified: Principal | ICD-10-CM

## 2020-03-05 DIAGNOSIS — E669 Obesity, unspecified: Principal | ICD-10-CM

## 2020-03-05 DIAGNOSIS — M7912 Myalgia of auxiliary muscles, head and neck: Principal | ICD-10-CM

## 2020-03-05 MED ORDER — BACLOFEN 20 MG TABLET
ORAL_TABLET | Freq: Four times a day (QID) | ORAL | 2 refills | 27 days | Status: CP | PRN
Start: 2020-03-05 — End: ?

## 2020-03-17 ENCOUNTER — Encounter: Admit: 2020-03-17 | Discharge: 2020-03-18 | Payer: MEDICARE

## 2020-03-19 ENCOUNTER — Encounter: Admit: 2020-03-19 | Discharge: 2020-03-20 | Payer: MEDICARE

## 2020-03-19 ENCOUNTER — Encounter: Admit: 2020-03-19 | Discharge: 2020-03-20 | Payer: MEDICARE | Attending: Anesthesiology | Primary: Anesthesiology

## 2020-03-19 DIAGNOSIS — M5136 Other intervertebral disc degeneration, lumbar region: Principal | ICD-10-CM

## 2020-03-20 DIAGNOSIS — E876 Hypokalemia: Principal | ICD-10-CM

## 2020-03-20 DIAGNOSIS — R072 Precordial pain: Principal | ICD-10-CM

## 2020-03-20 MED ORDER — POTASSIUM CHLORIDE ER 20 MEQ TABLET,EXTENDED RELEASE(PART/CRYST)
ORAL_TABLET | 4 refills | 0 days | Status: CP
Start: 2020-03-20 — End: ?

## 2020-03-20 MED ORDER — PANTOPRAZOLE 40 MG TABLET,DELAYED RELEASE
ORAL_TABLET | 4 refills | 0 days | Status: CP
Start: 2020-03-20 — End: ?

## 2020-03-24 ENCOUNTER — Encounter: Admit: 2020-03-24 | Discharge: 2020-03-25 | Payer: MEDICARE | Attending: Psychologist | Primary: Psychologist

## 2020-03-24 DIAGNOSIS — F419 Anxiety disorder, unspecified: Principal | ICD-10-CM

## 2020-03-24 DIAGNOSIS — F1190 Opioid use, unspecified, uncomplicated: Principal | ICD-10-CM

## 2020-03-25 MED ORDER — OXYCODONE-ACETAMINOPHEN 10 MG-325 MG TABLET
ORAL_TABLET | Freq: Three times a day (TID) | ORAL | 0 refills | 30 days | Status: CP | PRN
Start: 2020-03-25 — End: 2020-04-24

## 2020-04-01 ENCOUNTER — Encounter: Admit: 2020-04-01 | Discharge: 2020-04-02 | Payer: MEDICARE | Attending: Family Medicine | Primary: Family Medicine

## 2020-04-01 DIAGNOSIS — E038 Other specified hypothyroidism: Principal | ICD-10-CM

## 2020-04-01 DIAGNOSIS — R590 Localized enlarged lymph nodes: Principal | ICD-10-CM

## 2020-04-01 DIAGNOSIS — E876 Hypokalemia: Principal | ICD-10-CM

## 2020-04-01 MED ORDER — AZITHROMYCIN 250 MG TABLET
ORAL_TABLET | 0 refills | 0 days | Status: CP
Start: 2020-04-01 — End: ?

## 2020-04-02 ENCOUNTER — Other Ambulatory Visit: Admit: 2020-04-02 | Discharge: 2020-04-03 | Payer: MEDICARE

## 2020-04-02 ENCOUNTER — Encounter: Admit: 2020-04-02 | Discharge: 2020-04-03 | Payer: MEDICARE

## 2020-04-02 DIAGNOSIS — R7989 Other specified abnormal findings of blood chemistry: Principal | ICD-10-CM

## 2020-04-02 DIAGNOSIS — E876 Hypokalemia: Principal | ICD-10-CM

## 2020-04-02 DIAGNOSIS — R590 Localized enlarged lymph nodes: Principal | ICD-10-CM

## 2020-04-02 DIAGNOSIS — E038 Other specified hypothyroidism: Principal | ICD-10-CM

## 2020-04-10 ENCOUNTER — Encounter: Admit: 2020-04-10 | Discharge: 2020-04-11 | Payer: MEDICARE | Attending: Family Medicine | Primary: Family Medicine

## 2020-04-10 DIAGNOSIS — J3089 Other allergic rhinitis: Principal | ICD-10-CM

## 2020-04-10 MED ORDER — MONTELUKAST 10 MG TABLET
ORAL_TABLET | Freq: Every evening | ORAL | 1 refills | 90 days | Status: CP
Start: 2020-04-10 — End: ?

## 2020-04-11 MED ORDER — AMOXICILLIN 500 MG CAPSULE
ORAL_CAPSULE | Freq: Three times a day (TID) | ORAL | 0 refills | 10.00000 days | Status: CP
Start: 2020-04-11 — End: 2020-04-21

## 2020-04-11 MED ORDER — FLUCONAZOLE 150 MG TABLET
ORAL_TABLET | Freq: Once | ORAL | 0 refills | 1 days | Status: CP
Start: 2020-04-11 — End: 2020-04-11

## 2020-04-19 DIAGNOSIS — M7918 Myalgia, other site: Principal | ICD-10-CM

## 2020-04-19 DIAGNOSIS — M5417 Radiculopathy, lumbosacral region: Principal | ICD-10-CM

## 2020-04-21 ENCOUNTER — Ambulatory Visit: Admit: 2020-04-21 | Discharge: 2020-04-22 | Payer: MEDICARE

## 2020-04-21 ENCOUNTER — Encounter: Admit: 2020-04-21 | Discharge: 2020-04-22 | Payer: MEDICARE | Attending: Anesthesiology | Primary: Anesthesiology

## 2020-04-21 DIAGNOSIS — M7918 Myalgia, other site: Principal | ICD-10-CM

## 2020-04-21 DIAGNOSIS — R221 Localized swelling, mass and lump, neck: Principal | ICD-10-CM

## 2020-04-21 MED ORDER — GABAPENTIN 800 MG TABLET
ORAL_TABLET | 2 refills | 0 days | Status: CP
Start: 2020-04-21 — End: ?

## 2020-04-22 ENCOUNTER — Encounter: Admit: 2020-04-22 | Discharge: 2020-04-23 | Payer: MEDICARE

## 2020-04-22 DIAGNOSIS — G894 Chronic pain syndrome: Principal | ICD-10-CM

## 2020-04-22 DIAGNOSIS — M7918 Myalgia, other site: Principal | ICD-10-CM

## 2020-04-22 DIAGNOSIS — M5417 Radiculopathy, lumbosacral region: Principal | ICD-10-CM

## 2020-04-22 MED ORDER — GABAPENTIN 800 MG TABLET
ORAL_TABLET | Freq: Two times a day (BID) | ORAL | 2 refills | 30.00000 days | Status: CP
Start: 2020-04-22 — End: ?

## 2020-04-23 ENCOUNTER — Ambulatory Visit: Admit: 2020-04-23 | Payer: MEDICARE | Attending: "Endocrinology | Primary: "Endocrinology

## 2020-04-23 DIAGNOSIS — N760 Acute vaginitis: Principal | ICD-10-CM

## 2020-04-23 MED ORDER — FLUCONAZOLE 150 MG TABLET
ORAL_TABLET | Freq: Once | ORAL | 0 refills | 1 days | Status: CP
Start: 2020-04-23 — End: 2020-04-23

## 2020-04-24 MED ORDER — OXYCODONE-ACETAMINOPHEN 10 MG-325 MG TABLET
ORAL_TABLET | Freq: Three times a day (TID) | ORAL | 0 refills | 30.00000 days | Status: CP | PRN
Start: 2020-04-24 — End: 2020-05-24

## 2020-04-25 ENCOUNTER — Encounter: Admit: 2020-04-25 | Discharge: 2020-04-26 | Payer: MEDICARE | Attending: Psychologist | Primary: Psychologist

## 2020-04-25 DIAGNOSIS — F1190 Opioid use, unspecified, uncomplicated: Principal | ICD-10-CM

## 2020-04-25 DIAGNOSIS — F419 Anxiety disorder, unspecified: Principal | ICD-10-CM

## 2020-04-28 ENCOUNTER — Encounter: Admit: 2020-04-28 | Discharge: 2020-04-29 | Payer: MEDICARE

## 2020-04-28 DIAGNOSIS — M7918 Myalgia, other site: Principal | ICD-10-CM

## 2020-04-28 DIAGNOSIS — M1712 Unilateral primary osteoarthritis, left knee: Principal | ICD-10-CM

## 2020-04-28 DIAGNOSIS — M1711 Unilateral primary osteoarthritis, right knee: Principal | ICD-10-CM

## 2020-04-29 ENCOUNTER — Encounter: Admit: 2020-04-29 | Discharge: 2020-04-29 | Payer: MEDICARE | Attending: "Endocrinology | Primary: "Endocrinology

## 2020-04-29 ENCOUNTER — Encounter: Admit: 2020-04-29 | Discharge: 2020-04-29 | Payer: MEDICARE

## 2020-04-29 DIAGNOSIS — R7989 Other specified abnormal findings of blood chemistry: Principal | ICD-10-CM

## 2020-05-02 DIAGNOSIS — E7800 Pure hypercholesterolemia, unspecified: Principal | ICD-10-CM

## 2020-05-02 MED ORDER — ATORVASTATIN 20 MG TABLET
ORAL_TABLET | 3 refills | 0 days | Status: CP
Start: 2020-05-02 — End: ?

## 2020-05-05 ENCOUNTER — Encounter: Admit: 2020-05-05 | Discharge: 2020-05-06 | Payer: MEDICARE

## 2020-05-05 DIAGNOSIS — M25561 Pain in right knee: Principal | ICD-10-CM

## 2020-05-05 DIAGNOSIS — G8929 Other chronic pain: Principal | ICD-10-CM

## 2020-05-05 DIAGNOSIS — M170 Bilateral primary osteoarthritis of knee: Principal | ICD-10-CM

## 2020-05-05 DIAGNOSIS — M25562 Pain in left knee: Principal | ICD-10-CM

## 2020-05-06 ENCOUNTER — Encounter: Admit: 2020-05-06 | Discharge: 2020-05-07 | Payer: MEDICARE | Attending: Internal Medicine | Primary: Internal Medicine

## 2020-05-06 DIAGNOSIS — J0101 Acute recurrent maxillary sinusitis: Principal | ICD-10-CM

## 2020-05-06 DIAGNOSIS — L501 Idiopathic urticaria: Principal | ICD-10-CM

## 2020-05-06 DIAGNOSIS — J301 Allergic rhinitis due to pollen: Principal | ICD-10-CM

## 2020-05-06 DIAGNOSIS — E039 Hypothyroidism, unspecified: Principal | ICD-10-CM

## 2020-05-06 DIAGNOSIS — L508 Other urticaria: Principal | ICD-10-CM

## 2020-05-06 MED ORDER — OMALIZUMAB 150 MG/ML SUBCUTANEOUS SYRINGE
SUBCUTANEOUS | 11 refills | 28.00000 days | Status: CP
Start: 2020-05-06 — End: ?

## 2020-05-07 ENCOUNTER — Ambulatory Visit: Admit: 2020-05-07 | Discharge: 2020-05-08 | Payer: MEDICARE

## 2020-05-13 ENCOUNTER — Institutional Professional Consult (permissible substitution): Admit: 2020-05-13 | Discharge: 2020-05-14 | Payer: MEDICARE

## 2020-05-13 ENCOUNTER — Encounter: Admit: 2020-05-13 | Discharge: 2020-05-14 | Payer: MEDICARE

## 2020-05-13 DIAGNOSIS — R600 Localized edema: Principal | ICD-10-CM

## 2020-05-13 DIAGNOSIS — M170 Bilateral primary osteoarthritis of knee: Principal | ICD-10-CM

## 2020-05-13 DIAGNOSIS — L508 Other urticaria: Principal | ICD-10-CM

## 2020-05-13 DIAGNOSIS — M7918 Myalgia, other site: Principal | ICD-10-CM

## 2020-05-13 MED ORDER — HYDROCHLOROTHIAZIDE 12.5 MG TABLET
ORAL_TABLET | 0 refills | 0 days | Status: CP
Start: 2020-05-13 — End: ?

## 2020-05-13 MED ORDER — BACLOFEN 20 MG TABLET
ORAL_TABLET | Freq: Four times a day (QID) | ORAL | 2 refills | 27 days | Status: CP | PRN
Start: 2020-05-13 — End: ?

## 2020-05-14 ENCOUNTER — Encounter: Admit: 2020-05-14 | Discharge: 2020-05-15 | Payer: MEDICARE

## 2020-05-14 DIAGNOSIS — L501 Idiopathic urticaria: Principal | ICD-10-CM

## 2020-05-14 MED ORDER — EPINEPHRINE 0.3 MG/0.3 ML INJECTION, AUTO-INJECTOR
Freq: Once | INTRAMUSCULAR | 0 refills | 2 days | Status: CP
Start: 2020-05-14 — End: 2020-05-14

## 2020-05-19 ENCOUNTER — Encounter: Admit: 2020-05-19 | Discharge: 2020-05-20 | Payer: MEDICARE

## 2020-05-24 MED ORDER — OXYCODONE-ACETAMINOPHEN 10 MG-325 MG TABLET
ORAL_TABLET | Freq: Three times a day (TID) | ORAL | 0 refills | 30 days | Status: CP | PRN
Start: 2020-05-24 — End: 2020-06-23

## 2020-05-26 ENCOUNTER — Telehealth: Admit: 2020-05-26 | Discharge: 2020-05-27 | Payer: MEDICARE | Attending: Family Medicine | Primary: Family Medicine

## 2020-05-26 DIAGNOSIS — J0100 Acute maxillary sinusitis, unspecified: Principal | ICD-10-CM

## 2020-05-26 DIAGNOSIS — G43009 Migraine without aura, not intractable, without status migrainosus: Principal | ICD-10-CM

## 2020-05-26 DIAGNOSIS — N760 Acute vaginitis: Principal | ICD-10-CM

## 2020-05-26 MED ORDER — LEVOFLOXACIN 500 MG TABLET
ORAL_TABLET | Freq: Every day | ORAL | 0 refills | 10 days | Status: CP
Start: 2020-05-26 — End: 2020-06-05

## 2020-05-26 MED ORDER — FLUCONAZOLE 150 MG TABLET
ORAL_TABLET | Freq: Every day | ORAL | 0 refills | 2 days | Status: CP | PRN
Start: 2020-05-26 — End: ?

## 2020-05-26 MED ORDER — ASPIRIN-ACETAMINOPHEN-CAFFEINE 250 MG-250 MG-65 MG TABLET
ORAL_TABLET | Freq: Two times a day (BID) | ORAL | 0 refills | 15 days | Status: CP | PRN
Start: 2020-05-26 — End: ?

## 2020-05-30 ENCOUNTER — Encounter: Admit: 2020-05-30 | Discharge: 2020-05-31 | Payer: MEDICARE | Attending: Anesthesiology | Primary: Anesthesiology

## 2020-05-30 ENCOUNTER — Ambulatory Visit: Admit: 2020-05-30 | Discharge: 2020-05-31 | Payer: MEDICARE

## 2020-05-30 DIAGNOSIS — M7918 Myalgia, other site: Principal | ICD-10-CM

## 2020-06-05 ENCOUNTER — Ambulatory Visit: Admit: 2020-06-05 | Discharge: 2020-06-06 | Payer: MEDICARE | Attending: Anesthesiology | Primary: Anesthesiology

## 2020-06-05 DIAGNOSIS — M5417 Radiculopathy, lumbosacral region: Principal | ICD-10-CM

## 2020-06-05 DIAGNOSIS — G894 Chronic pain syndrome: Principal | ICD-10-CM

## 2020-06-05 DIAGNOSIS — M7918 Myalgia, other site: Principal | ICD-10-CM

## 2020-06-05 DIAGNOSIS — M542 Cervicalgia: Principal | ICD-10-CM

## 2020-06-05 MED ORDER — GABAPENTIN 800 MG TABLET
ORAL_TABLET | Freq: Two times a day (BID) | ORAL | 2 refills | 30.00000 days | Status: CP
Start: 2020-06-05 — End: 2020-06-05

## 2020-06-12 ENCOUNTER — Encounter: Admit: 2020-06-12 | Discharge: 2020-06-13 | Payer: MEDICARE

## 2020-06-12 DIAGNOSIS — L509 Urticaria, unspecified: Principal | ICD-10-CM

## 2020-06-16 ENCOUNTER — Ambulatory Visit: Admit: 2020-06-16 | Payer: MEDICARE

## 2020-06-17 MED ORDER — OXYCODONE-ACETAMINOPHEN 10 MG-325 MG TABLET
ORAL_TABLET | Freq: Four times a day (QID) | ORAL | 0 refills | 30.00000 days | Status: CP | PRN
Start: 2020-06-17 — End: 2020-06-05

## 2020-06-23 DIAGNOSIS — R519 Recurrent headache: Principal | ICD-10-CM

## 2020-07-01 ENCOUNTER — Encounter: Admit: 2020-07-01 | Discharge: 2020-07-02 | Payer: MEDICARE | Attending: Psychologist | Primary: Psychologist

## 2020-07-01 ENCOUNTER — Telehealth: Admit: 2020-07-01 | Discharge: 2020-07-02 | Payer: MEDICARE

## 2020-07-01 DIAGNOSIS — B9689 Other specified bacterial agents as the cause of diseases classified elsewhere: Principal | ICD-10-CM

## 2020-07-01 DIAGNOSIS — B379 Candidiasis, unspecified: Principal | ICD-10-CM

## 2020-07-01 DIAGNOSIS — N760 Acute vaginitis: Principal | ICD-10-CM

## 2020-07-01 DIAGNOSIS — J320 Chronic maxillary sinusitis: Principal | ICD-10-CM

## 2020-07-01 DIAGNOSIS — F419 Anxiety disorder, unspecified: Principal | ICD-10-CM

## 2020-07-01 DIAGNOSIS — F1190 Opioid use, unspecified, uncomplicated: Principal | ICD-10-CM

## 2020-07-01 MED ORDER — METRONIDAZOLE 500 MG TABLET
ORAL_TABLET | Freq: Two times a day (BID) | ORAL | 0 refills | 7 days | Status: CP
Start: 2020-07-01 — End: 2020-07-08

## 2020-07-01 MED ORDER — FLUCONAZOLE 150 MG TABLET
ORAL_TABLET | 3 refills | 0.00000 days | Status: CP
Start: 2020-07-01 — End: 2020-07-01

## 2020-07-01 MED ORDER — AZITHROMYCIN 250 MG TABLET
ORAL_TABLET | Freq: Every day | ORAL | 0 refills | 6.00000 days | Status: CP
Start: 2020-07-01 — End: 2020-07-06

## 2020-07-02 MED ORDER — DICLOFENAC 1 % TOPICAL GEL
0 refills | 0 days | Status: CP
Start: 2020-07-02 — End: ?

## 2020-07-04 DIAGNOSIS — E559 Vitamin D deficiency, unspecified: Principal | ICD-10-CM

## 2020-07-09 ENCOUNTER — Encounter: Admit: 2020-07-09 | Discharge: 2020-07-10 | Payer: MEDICARE

## 2020-07-09 ENCOUNTER — Encounter: Admit: 2020-07-09 | Discharge: 2020-07-10 | Payer: MEDICARE | Attending: Anesthesiology | Primary: Anesthesiology

## 2020-07-09 DIAGNOSIS — M7918 Myalgia, other site: Principal | ICD-10-CM

## 2020-07-11 DIAGNOSIS — G43009 Migraine without aura, not intractable, without status migrainosus: Principal | ICD-10-CM

## 2020-07-14 MED ORDER — ASPIRIN-ACETAMINOPHEN-CAFFEINE 250 MG-250 MG-65 MG TABLET
ORAL_TABLET | Freq: Two times a day (BID) | ORAL | 0 refills | 15 days | Status: CP | PRN
Start: 2020-07-14 — End: ?

## 2020-07-15 ENCOUNTER — Ambulatory Visit: Admit: 2020-07-15 | Discharge: 2020-07-16 | Payer: MEDICARE

## 2020-07-15 DIAGNOSIS — L501 Idiopathic urticaria: Principal | ICD-10-CM

## 2020-07-17 MED ORDER — OXYCODONE-ACETAMINOPHEN 10 MG-325 MG TABLET
ORAL_TABLET | Freq: Four times a day (QID) | ORAL | 0 refills | 30.00000 days | Status: CP | PRN
Start: 2020-07-17 — End: 2020-06-05

## 2020-08-08 ENCOUNTER — Encounter: Admit: 2020-08-08 | Discharge: 2020-08-09 | Disposition: A | Discharge status: Home / Self Care | Payer: MEDICARE

## 2020-08-08 ENCOUNTER — Emergency Department: Admit: 2020-08-08 | Discharge: 2020-08-09 | Disposition: A | Discharge status: Home / Self Care | Payer: MEDICARE

## 2020-08-08 DIAGNOSIS — S0990XA Unspecified injury of head, initial encounter: Principal | ICD-10-CM

## 2020-08-08 DIAGNOSIS — R600 Localized edema: Principal | ICD-10-CM

## 2020-08-08 DIAGNOSIS — S161XXA Strain of muscle, fascia and tendon at neck level, initial encounter: Principal | ICD-10-CM

## 2020-08-08 DIAGNOSIS — S39012A Strain of muscle, fascia and tendon of lower back, initial encounter: Principal | ICD-10-CM

## 2020-08-08 DIAGNOSIS — S8012XA Contusion of left lower leg, initial encounter: Principal | ICD-10-CM

## 2020-08-08 MED ORDER — NAPROXEN 500 MG TABLET
ORAL_TABLET | Freq: Two times a day (BID) | ORAL | 0 refills | 10 days | Status: CP | PRN
Start: 2020-08-08 — End: 2021-08-08

## 2020-08-08 MED ORDER — MONTELUKAST 10 MG TABLET
ORAL_TABLET | Freq: Every evening | ORAL | 1 refills | 90 days | Status: CP
Start: 2020-08-08 — End: ?

## 2020-08-08 MED ORDER — AMOXICILLIN 500 MG CAPSULE
ORAL_CAPSULE | Freq: Three times a day (TID) | ORAL | 0 refills | 10 days | Status: CP
Start: 2020-08-08 — End: 2020-08-18

## 2020-08-08 MED ORDER — FLUCONAZOLE 150 MG TABLET
ORAL_TABLET | 0 refills | 0 days | Status: CP
Start: 2020-08-08 — End: ?

## 2020-08-11 ENCOUNTER — Encounter: Admit: 2020-08-11 | Discharge: 2020-08-12 | Payer: MEDICARE

## 2020-08-11 DIAGNOSIS — R519 Intractable headache, unspecified chronicity pattern, unspecified headache type: Principal | ICD-10-CM

## 2020-08-11 DIAGNOSIS — M7918 Myalgia, other site: Principal | ICD-10-CM

## 2020-08-11 DIAGNOSIS — M5417 Radiculopathy, lumbosacral region: Principal | ICD-10-CM

## 2020-08-11 DIAGNOSIS — G894 Chronic pain syndrome: Principal | ICD-10-CM

## 2020-08-11 MED ORDER — BACLOFEN 20 MG TABLET
ORAL_TABLET | Freq: Four times a day (QID) | ORAL | 2 refills | 27.00000 days | Status: CP | PRN
Start: 2020-08-11 — End: ?

## 2020-08-11 MED ORDER — HYDROCHLOROTHIAZIDE 12.5 MG TABLET
ORAL_TABLET | 0 refills | 0 days | Status: CP
Start: 2020-08-11 — End: ?

## 2020-08-11 MED ORDER — BUTALBITAL-ACETAMINOPHEN-CAFFEINE 50 MG-325 MG-40 MG TABLET
ORAL_TABLET | Freq: Every day | ORAL | 0 refills | 30.00000 days | Status: CP | PRN
Start: 2020-08-11 — End: ?

## 2020-08-13 ENCOUNTER — Encounter: Admit: 2020-08-13 | Discharge: 2020-08-14 | Payer: MEDICARE

## 2020-08-13 ENCOUNTER — Encounter: Admit: 2020-08-13 | Discharge: 2020-08-14 | Payer: MEDICARE | Attending: Psychologist | Primary: Psychologist

## 2020-08-13 DIAGNOSIS — L501 Idiopathic urticaria: Principal | ICD-10-CM

## 2020-08-16 MED ORDER — OXYCODONE-ACETAMINOPHEN 10 MG-325 MG TABLET
ORAL_TABLET | Freq: Four times a day (QID) | ORAL | 0 refills | 30 days | Status: CP | PRN
Start: 2020-08-16 — End: ?

## 2020-08-18 DIAGNOSIS — M5417 Radiculopathy, lumbosacral region: Principal | ICD-10-CM

## 2020-08-18 DIAGNOSIS — E876 Hypokalemia: Principal | ICD-10-CM

## 2020-08-18 MED ORDER — POTASSIUM CHLORIDE ER 20 MEQ TABLET,EXTENDED RELEASE(PART/CRYST)
ORAL_TABLET | 4 refills | 0 days | Status: CP
Start: 2020-08-18 — End: ?

## 2020-08-20 DIAGNOSIS — R072 Precordial pain: Principal | ICD-10-CM

## 2020-08-21 ENCOUNTER — Encounter: Admit: 2020-08-21 | Discharge: 2020-08-22 | Payer: MEDICARE | Attending: Anesthesiology | Primary: Anesthesiology

## 2020-08-21 DIAGNOSIS — M5417 Radiculopathy, lumbosacral region: Principal | ICD-10-CM

## 2020-08-21 DIAGNOSIS — M7918 Myalgia, other site: Principal | ICD-10-CM

## 2020-08-21 MED ORDER — PANTOPRAZOLE 40 MG TABLET,DELAYED RELEASE
ORAL_TABLET | 4 refills | 0 days | Status: CP
Start: 2020-08-21 — End: ?

## 2020-08-23 ENCOUNTER — Encounter: Admit: 2020-08-23 | Discharge: 2020-08-24 | Payer: MEDICARE

## 2020-08-23 DIAGNOSIS — H6505 Acute serous otitis media, recurrent, left ear: Principal | ICD-10-CM

## 2020-08-23 DIAGNOSIS — H6123 Impacted cerumen, bilateral: Principal | ICD-10-CM

## 2020-08-23 MED ORDER — PREDNISONE 20 MG TABLET
ORAL_TABLET | Freq: Every day | ORAL | 0 refills | 4 days | Status: CP
Start: 2020-08-23 — End: 2020-08-27

## 2020-08-26 ENCOUNTER — Encounter: Admit: 2020-08-26 | Discharge: 2020-08-27 | Payer: MEDICARE

## 2020-08-26 DIAGNOSIS — J0100 Acute maxillary sinusitis, unspecified: Principal | ICD-10-CM

## 2020-08-26 DIAGNOSIS — G43009 Migraine without aura, not intractable, without status migrainosus: Principal | ICD-10-CM

## 2020-08-26 DIAGNOSIS — B9689 Other specified bacterial agents as the cause of diseases classified elsewhere: Principal | ICD-10-CM

## 2020-08-26 DIAGNOSIS — N760 Acute vaginitis: Principal | ICD-10-CM

## 2020-08-26 MED ORDER — BUTALBITAL-ACETAMINOPHEN-CAFFEINE 50 MG-325 MG-40 MG TABLET
ORAL_TABLET | Freq: Every day | ORAL | 0 refills | 30 days | Status: CP | PRN
Start: 2020-08-26 — End: ?

## 2020-08-26 MED ORDER — METRONIDAZOLE 500 MG TABLET
ORAL_TABLET | Freq: Two times a day (BID) | ORAL | 0 refills | 7 days | Status: CP
Start: 2020-08-26 — End: 2020-09-02

## 2020-08-26 MED ORDER — LEVOFLOXACIN 500 MG TABLET
ORAL_TABLET | Freq: Every day | ORAL | 0 refills | 7.00000 days | Status: CP
Start: 2020-08-26 — End: 2020-09-02

## 2020-09-02 DIAGNOSIS — J0100 Acute maxillary sinusitis, unspecified: Principal | ICD-10-CM

## 2020-09-02 DIAGNOSIS — J320 Chronic maxillary sinusitis: Principal | ICD-10-CM

## 2020-09-02 MED ORDER — AZITHROMYCIN 250 MG TABLET
ORAL_TABLET | Freq: Every day | ORAL | 0 refills | 6 days | Status: CP
Start: 2020-09-02 — End: 2020-09-07

## 2020-09-05 DIAGNOSIS — L501 Idiopathic urticaria: Principal | ICD-10-CM

## 2020-09-05 MED ORDER — OMALIZUMAB 150 MG/ML SUBCUTANEOUS SYRINGE
SUBCUTANEOUS | 11 refills | 28.00000 days | Status: CP
Start: 2020-09-05 — End: ?

## 2020-09-10 ENCOUNTER — Institutional Professional Consult (permissible substitution): Admit: 2020-09-10 | Discharge: 2020-09-11 | Payer: MEDICARE

## 2020-09-10 DIAGNOSIS — E7800 Pure hypercholesterolemia, unspecified: Principal | ICD-10-CM

## 2020-09-10 DIAGNOSIS — L501 Idiopathic urticaria: Principal | ICD-10-CM

## 2020-09-12 MED ORDER — ATORVASTATIN 20 MG TABLET
ORAL_TABLET | 0 refills | 0 days | Status: CP
Start: 2020-09-12 — End: ?

## 2020-09-14 DIAGNOSIS — E7800 Pure hypercholesterolemia, unspecified: Principal | ICD-10-CM

## 2020-09-15 DIAGNOSIS — G894 Chronic pain syndrome: Principal | ICD-10-CM

## 2020-09-15 MED ORDER — OXYCODONE-ACETAMINOPHEN 10 MG-325 MG TABLET
ORAL_TABLET | Freq: Four times a day (QID) | ORAL | 0 refills | 30.00000 days | Status: CP | PRN
Start: 2020-09-15 — End: ?

## 2020-09-24 ENCOUNTER — Telehealth: Admit: 2020-09-24 | Discharge: 2020-09-25 | Payer: MEDICARE | Attending: Psychologist | Primary: Psychologist

## 2020-09-24 DIAGNOSIS — F419 Anxiety disorder, unspecified: Principal | ICD-10-CM

## 2020-09-24 DIAGNOSIS — F1190 Opioid use, unspecified, uncomplicated: Principal | ICD-10-CM

## 2020-09-25 ENCOUNTER — Telehealth: Admit: 2020-09-25 | Discharge: 2020-09-26 | Payer: MEDICARE | Attending: Family Medicine | Primary: Family Medicine

## 2020-09-25 ENCOUNTER — Ambulatory Visit: Admit: 2020-09-25 | Discharge: 2020-09-26 | Payer: MEDICARE | Attending: Anesthesiology | Primary: Anesthesiology

## 2020-09-25 DIAGNOSIS — H6692 Otitis media, unspecified, left ear: Principal | ICD-10-CM

## 2020-09-25 DIAGNOSIS — M47816 Spondylosis without myelopathy or radiculopathy, lumbar region: Principal | ICD-10-CM

## 2020-09-25 DIAGNOSIS — B379 Candidiasis, unspecified: Principal | ICD-10-CM

## 2020-09-25 DIAGNOSIS — M7918 Myalgia, other site: Principal | ICD-10-CM

## 2020-09-25 MED ORDER — AMOXICILLIN 500 MG CAPSULE
ORAL_CAPSULE | Freq: Two times a day (BID) | ORAL | 0 refills | 10.00000 days | Status: CP
Start: 2020-09-25 — End: 2020-10-05

## 2020-09-25 MED ORDER — FLUCONAZOLE 150 MG TABLET
ORAL_TABLET | 0 refills | 0 days | Status: CP
Start: 2020-09-25 — End: ?

## 2020-09-30 ENCOUNTER — Ambulatory Visit: Admit: 2020-09-30 | Discharge: 2020-10-01 | Payer: MEDICARE

## 2020-09-30 DIAGNOSIS — M1712 Unilateral primary osteoarthritis, left knee: Principal | ICD-10-CM

## 2020-09-30 DIAGNOSIS — M1711 Unilateral primary osteoarthritis, right knee: Principal | ICD-10-CM

## 2020-09-30 DIAGNOSIS — M7061 Trochanteric bursitis, right hip: Principal | ICD-10-CM

## 2020-10-02 ENCOUNTER — Telehealth: Admit: 2020-10-02 | Discharge: 2020-10-03 | Payer: MEDICARE | Attending: Family | Primary: Family

## 2020-10-02 DIAGNOSIS — G4440 Drug-induced headache, not elsewhere classified, not intractable: Principal | ICD-10-CM

## 2020-10-02 DIAGNOSIS — G44221 Chronic tension-type headache, intractable: Principal | ICD-10-CM

## 2020-10-02 DIAGNOSIS — G43019 Migraine without aura, intractable, without status migrainosus: Principal | ICD-10-CM

## 2020-10-02 MED ORDER — RIZATRIPTAN 10 MG DISINTEGRATING TABLET
ORAL_TABLET | 5 refills | 0 days | Status: CP
Start: 2020-10-02 — End: ?

## 2020-10-02 MED ORDER — NORTRIPTYLINE 10 MG CAPSULE
ORAL_CAPSULE | Freq: Every evening | ORAL | 1 refills | 90 days | Status: CP
Start: 2020-10-02 — End: ?

## 2020-10-08 ENCOUNTER — Telehealth: Admit: 2020-10-08 | Discharge: 2020-10-09 | Payer: MEDICARE

## 2020-10-08 ENCOUNTER — Ambulatory Visit: Admit: 2020-10-08 | Discharge: 2020-10-09 | Payer: MEDICARE

## 2020-10-08 DIAGNOSIS — G894 Chronic pain syndrome: Principal | ICD-10-CM

## 2020-10-08 DIAGNOSIS — M542 Cervicalgia: Principal | ICD-10-CM

## 2020-10-08 DIAGNOSIS — F1190 Opioid use, unspecified, uncomplicated: Principal | ICD-10-CM

## 2020-10-08 DIAGNOSIS — M170 Bilateral primary osteoarthritis of knee: Principal | ICD-10-CM

## 2020-10-08 DIAGNOSIS — M7918 Myalgia, other site: Principal | ICD-10-CM

## 2020-10-08 DIAGNOSIS — M5417 Radiculopathy, lumbosacral region: Principal | ICD-10-CM

## 2020-10-15 ENCOUNTER — Telehealth: Admit: 2020-10-15 | Discharge: 2020-10-16 | Payer: MEDICARE | Attending: Psychologist | Primary: Psychologist

## 2020-10-15 MED ORDER — OXYCODONE-ACETAMINOPHEN 10 MG-325 MG TABLET
ORAL_TABLET | Freq: Four times a day (QID) | ORAL | 0 refills | 30 days | Status: CP | PRN
Start: 2020-10-15 — End: ?

## 2020-10-17 DIAGNOSIS — N951 Menopausal and female climacteric states: Principal | ICD-10-CM

## 2020-10-17 DIAGNOSIS — Z79890 Hormone replacement therapy (postmenopausal): Principal | ICD-10-CM

## 2020-10-17 MED ORDER — DICLOFENAC 1 % TOPICAL GEL
Freq: Four times a day (QID) | TOPICAL | 0 refills | 32.00000 days | Status: CP
Start: 2020-10-17 — End: ?

## 2020-10-22 ENCOUNTER — Ambulatory Visit: Admit: 2020-10-22 | Payer: MEDICARE

## 2020-10-23 ENCOUNTER — Institutional Professional Consult (permissible substitution): Admit: 2020-10-23 | Discharge: 2020-10-24 | Payer: MEDICARE

## 2020-10-23 DIAGNOSIS — L509 Urticaria, unspecified: Principal | ICD-10-CM

## 2020-10-28 ENCOUNTER — Ambulatory Visit: Admit: 2020-10-28 | Discharge: 2020-10-29 | Payer: MEDICARE | Attending: Anesthesiology | Primary: Anesthesiology

## 2020-10-28 DIAGNOSIS — M47816 Spondylosis without myelopathy or radiculopathy, lumbar region: Principal | ICD-10-CM

## 2020-11-01 DIAGNOSIS — M7918 Myalgia, other site: Principal | ICD-10-CM

## 2020-11-03 MED ORDER — BACLOFEN 20 MG TABLET
ORAL_TABLET | 2 refills | 0 days | Status: CP
Start: 2020-11-03 — End: ?

## 2020-11-04 ENCOUNTER — Ambulatory Visit: Admit: 2020-11-04 | Discharge: 2020-11-05 | Payer: MEDICARE | Attending: Internal Medicine | Primary: Internal Medicine

## 2020-11-04 ENCOUNTER — Ambulatory Visit: Admit: 2020-11-04 | Discharge: 2020-11-05 | Payer: MEDICARE

## 2020-11-04 DIAGNOSIS — M1711 Unilateral primary osteoarthritis, right knee: Principal | ICD-10-CM

## 2020-11-04 DIAGNOSIS — M1712 Unilateral primary osteoarthritis, left knee: Principal | ICD-10-CM

## 2020-11-04 DIAGNOSIS — L501 Idiopathic urticaria: Principal | ICD-10-CM

## 2020-11-04 DIAGNOSIS — L299 Pruritus, unspecified: Principal | ICD-10-CM

## 2020-11-04 DIAGNOSIS — M7061 Trochanteric bursitis, right hip: Principal | ICD-10-CM

## 2020-11-04 MED ORDER — DICLOFENAC 20 MG/GRAM/ACTUATION (2 %) TOPICAL SOLN METERED-DOSE PUMP
TOPICAL | 2 refills | 0.00000 days | Status: CP
Start: 2020-11-04 — End: ?

## 2020-11-04 MED ORDER — AZELASTINE 137 MCG (0.1 %) NASAL SPRAY AEROSOL
Freq: Two times a day (BID) | NASAL | 12 refills | 0.00000 days | Status: CP
Start: 2020-11-04 — End: ?

## 2020-11-05 ENCOUNTER — Ambulatory Visit: Admit: 2020-11-05 | Discharge: 2020-11-06 | Payer: MEDICARE | Attending: Anesthesiology | Primary: Anesthesiology

## 2020-11-05 DIAGNOSIS — M7918 Myalgia, other site: Principal | ICD-10-CM

## 2020-11-10 DIAGNOSIS — E7800 Pure hypercholesterolemia, unspecified: Principal | ICD-10-CM

## 2020-11-10 MED ORDER — ATORVASTATIN 20 MG TABLET
ORAL_TABLET | Freq: Every day | ORAL | 1 refills | 30.00000 days | Status: CP
Start: 2020-11-10 — End: 2020-11-10

## 2020-11-11 ENCOUNTER — Ambulatory Visit: Admit: 2020-11-11 | Discharge: 2020-11-12 | Payer: MEDICARE | Attending: Anesthesiology | Primary: Anesthesiology

## 2020-11-11 DIAGNOSIS — M47816 Spondylosis without myelopathy or radiculopathy, lumbar region: Principal | ICD-10-CM

## 2020-11-12 ENCOUNTER — Ambulatory Visit: Admit: 2020-11-12 | Discharge: 2020-11-13 | Payer: MEDICARE

## 2020-11-12 ENCOUNTER — Telehealth: Admit: 2020-11-12 | Discharge: 2020-11-13 | Payer: MEDICARE | Attending: Psychologist | Primary: Psychologist

## 2020-11-12 DIAGNOSIS — Z0289 Encounter for other administrative examinations: Principal | ICD-10-CM

## 2020-11-12 DIAGNOSIS — F419 Anxiety disorder, unspecified: Principal | ICD-10-CM

## 2020-11-12 DIAGNOSIS — F1190 Opioid use, unspecified, uncomplicated: Principal | ICD-10-CM

## 2020-11-12 DIAGNOSIS — G894 Chronic pain syndrome: Principal | ICD-10-CM

## 2020-11-12 MED ORDER — OXYCODONE-ACETAMINOPHEN 10 MG-325 MG TABLET
ORAL_TABLET | Freq: Four times a day (QID) | ORAL | 0 refills | 30 days | Status: CP | PRN
Start: 2020-11-12 — End: ?

## 2020-11-14 ENCOUNTER — Ambulatory Visit: Admit: 2020-11-14 | Discharge: 2020-11-15 | Payer: MEDICARE

## 2020-11-14 DIAGNOSIS — R600 Localized edema: Principal | ICD-10-CM

## 2020-11-14 DIAGNOSIS — M7918 Myalgia, other site: Principal | ICD-10-CM

## 2020-11-14 DIAGNOSIS — J0191 Acute recurrent sinusitis, unspecified: Principal | ICD-10-CM

## 2020-11-14 DIAGNOSIS — M5417 Radiculopathy, lumbosacral region: Principal | ICD-10-CM

## 2020-11-14 MED ORDER — HYDROCHLOROTHIAZIDE 12.5 MG TABLET
ORAL_TABLET | 0 refills | 0 days | Status: CP
Start: 2020-11-14 — End: ?

## 2020-11-14 MED ORDER — GABAPENTIN 800 MG TABLET
ORAL_TABLET | Freq: Two times a day (BID) | ORAL | 2 refills | 30.00000 days | Status: CP
Start: 2020-11-14 — End: ?

## 2020-11-16 DIAGNOSIS — E876 Hypokalemia: Principal | ICD-10-CM

## 2020-11-17 MED ORDER — POTASSIUM CHLORIDE ER 20 MEQ TABLET,EXTENDED RELEASE(PART/CRYST)
ORAL_TABLET | 4 refills | 0 days | Status: CP
Start: 2020-11-17 — End: ?

## 2020-11-21 ENCOUNTER — Institutional Professional Consult (permissible substitution): Admit: 2020-11-21 | Discharge: 2020-11-22 | Payer: MEDICARE

## 2020-11-21 DIAGNOSIS — L508 Other urticaria: Principal | ICD-10-CM

## 2020-12-08 ENCOUNTER — Telehealth: Admit: 2020-12-08 | Discharge: 2020-12-09 | Payer: MEDICARE | Attending: Psychologist | Primary: Psychologist

## 2020-12-08 DIAGNOSIS — F1190 Opioid use, unspecified, uncomplicated: Principal | ICD-10-CM

## 2020-12-08 DIAGNOSIS — F419 Anxiety disorder, unspecified: Principal | ICD-10-CM

## 2020-12-08 DIAGNOSIS — G894 Chronic pain syndrome: Principal | ICD-10-CM

## 2020-12-10 ENCOUNTER — Ambulatory Visit: Admit: 2020-12-10 | Discharge: 2020-12-11 | Payer: MEDICARE

## 2020-12-10 ENCOUNTER — Ambulatory Visit: Admit: 2020-12-10 | Discharge: 2020-12-11 | Payer: MEDICARE | Attending: Anesthesiology | Primary: Anesthesiology

## 2020-12-10 DIAGNOSIS — M47817 Spondylosis without myelopathy or radiculopathy, lumbosacral region: Principal | ICD-10-CM

## 2020-12-14 MED ORDER — OXYCODONE-ACETAMINOPHEN 10 MG-325 MG TABLET
ORAL_TABLET | Freq: Four times a day (QID) | ORAL | 0 refills | 30 days | Status: CP | PRN
Start: 2020-12-14 — End: ?

## 2020-12-23 ENCOUNTER — Ambulatory Visit: Admit: 2020-12-23 | Discharge: 2020-12-23 | Payer: MEDICARE

## 2020-12-23 ENCOUNTER — Ambulatory Visit: Admit: 2020-12-23 | Discharge: 2020-12-23 | Payer: MEDICARE | Attending: Family Medicine | Primary: Family Medicine

## 2020-12-23 DIAGNOSIS — Z1231 Encounter for screening mammogram for malignant neoplasm of breast: Principal | ICD-10-CM

## 2020-12-23 DIAGNOSIS — R350 Frequency of micturition: Principal | ICD-10-CM

## 2020-12-23 DIAGNOSIS — K047 Periapical abscess without sinus: Principal | ICD-10-CM

## 2020-12-23 DIAGNOSIS — K0500 Acute gingivitis, plaque induced: Principal | ICD-10-CM

## 2020-12-23 MED ORDER — FLUCONAZOLE 150 MG TABLET
ORAL_TABLET | 0 refills | 0 days | Status: CP
Start: 2020-12-23 — End: ?

## 2020-12-23 MED ORDER — AMOXICILLIN 500 MG CAPSULE
ORAL_CAPSULE | Freq: Three times a day (TID) | ORAL | 0 refills | 10.00000 days | Status: CP
Start: 2020-12-23 — End: ?

## 2020-12-23 MED ORDER — CHLORHEXIDINE GLUCONATE 0.12 % MOUTHWASH
Freq: Two times a day (BID) | OROMUCOSAL | 0 refills | 16 days | Status: CP
Start: 2020-12-23 — End: ?

## 2020-12-26 DIAGNOSIS — Z1211 Encounter for screening for malignant neoplasm of colon: Principal | ICD-10-CM

## 2020-12-31 ENCOUNTER — Ambulatory Visit: Admit: 2020-12-31 | Discharge: 2021-01-01 | Payer: MEDICARE | Attending: Anesthesiology | Primary: Anesthesiology

## 2020-12-31 DIAGNOSIS — M7918 Myalgia, other site: Principal | ICD-10-CM

## 2020-12-31 DIAGNOSIS — M542 Cervicalgia: Principal | ICD-10-CM

## 2020-12-31 DIAGNOSIS — G894 Chronic pain syndrome: Principal | ICD-10-CM

## 2020-12-31 MED ORDER — BACLOFEN 20 MG TABLET
ORAL_TABLET | Freq: Three times a day (TID) | ORAL | 2 refills | 35 days | Status: CP
Start: 2020-12-31 — End: 2021-01-30

## 2020-12-31 MED ORDER — NALOXONE 4 MG/ACTUATION NASAL SPRAY
0 refills | 0 days | Status: CP
Start: 2020-12-31 — End: ?

## 2021-01-05 ENCOUNTER — Institutional Professional Consult (permissible substitution): Admit: 2021-01-05 | Discharge: 2021-01-06 | Payer: MEDICARE | Attending: Psychologist | Primary: Psychologist

## 2021-01-05 DIAGNOSIS — F1190 Opioid use, unspecified, uncomplicated: Principal | ICD-10-CM

## 2021-01-05 DIAGNOSIS — F419 Anxiety disorder, unspecified: Principal | ICD-10-CM

## 2021-01-06 ENCOUNTER — Ambulatory Visit: Admit: 2021-01-06 | Discharge: 2021-01-06 | Payer: MEDICARE

## 2021-01-06 ENCOUNTER — Ambulatory Visit: Admit: 2021-01-06 | Discharge: 2021-01-06 | Payer: MEDICARE | Attending: Anesthesiology | Primary: Anesthesiology

## 2021-01-06 ENCOUNTER — Telehealth: Admit: 2021-01-06 | Discharge: 2021-01-06 | Payer: MEDICARE | Attending: Family | Primary: Family

## 2021-01-06 DIAGNOSIS — G43019 Migraine without aura, intractable, without status migrainosus: Principal | ICD-10-CM

## 2021-01-06 DIAGNOSIS — Z1211 Encounter for screening for malignant neoplasm of colon: Principal | ICD-10-CM

## 2021-01-06 DIAGNOSIS — M7918 Myalgia, other site: Principal | ICD-10-CM

## 2021-01-06 DIAGNOSIS — G44221 Chronic tension-type headache, intractable: Principal | ICD-10-CM

## 2021-01-06 MED ORDER — SUTAB 1.479-0.188-0.225 GRAM TABLET
ORAL_TABLET | 0 refills | 0 days | Status: CP
Start: 2021-01-06 — End: ?

## 2021-01-06 MED ORDER — NORTRIPTYLINE 25 MG CAPSULE
ORAL_CAPSULE | Freq: Every evening | ORAL | 1 refills | 90 days | Status: CP
Start: 2021-01-06 — End: ?

## 2021-01-06 MED ORDER — RIZATRIPTAN 10 MG DISINTEGRATING TABLET
ORAL_TABLET | 5 refills | 0 days | Status: CP
Start: 2021-01-06 — End: ?

## 2021-01-12 MED ORDER — PEG-ELECTROLYTE SOLUTION 420 GRAM ORAL SOLUTION
0 refills | 0 days | Status: CP
Start: 2021-01-12 — End: ?

## 2021-01-13 MED ORDER — OXYCODONE-ACETAMINOPHEN 10 MG-325 MG TABLET
ORAL_TABLET | Freq: Four times a day (QID) | ORAL | 0 refills | 30.00000 days | Status: CP | PRN
Start: 2021-01-13 — End: ?

## 2021-01-26 DIAGNOSIS — E7800 Pure hypercholesterolemia, unspecified: Principal | ICD-10-CM

## 2021-01-26 MED ORDER — ROSUVASTATIN 10 MG TABLET
ORAL_TABLET | Freq: Every evening | ORAL | 1 refills | 90 days | Status: CP
Start: 2021-01-26 — End: 2022-01-26

## 2021-01-29 ENCOUNTER — Institutional Professional Consult (permissible substitution): Admit: 2021-01-29 | Discharge: 2021-01-30 | Payer: MEDICARE

## 2021-01-29 DIAGNOSIS — R072 Precordial pain: Principal | ICD-10-CM

## 2021-01-29 MED ORDER — FLUCONAZOLE 150 MG TABLET
ORAL_TABLET | 0 refills | 0 days | Status: CP
Start: 2021-01-29 — End: ?

## 2021-01-29 MED ORDER — METRONIDAZOLE 500 MG TABLET
ORAL_TABLET | Freq: Two times a day (BID) | ORAL | 0 refills | 7.00000 days | Status: CP
Start: 2021-01-29 — End: 2021-02-05

## 2021-01-30 MED ORDER — PANTOPRAZOLE 40 MG TABLET,DELAYED RELEASE
ORAL_TABLET | 4 refills | 0 days | Status: CP
Start: 2021-01-30 — End: ?

## 2021-02-12 ENCOUNTER — Ambulatory Visit: Admit: 2021-02-12 | Discharge: 2021-02-13 | Payer: MEDICARE | Attending: Family Medicine | Primary: Family Medicine

## 2021-02-12 DIAGNOSIS — Z7251 High risk heterosexual behavior: Principal | ICD-10-CM

## 2021-02-12 DIAGNOSIS — J3089 Other allergic rhinitis: Principal | ICD-10-CM

## 2021-02-12 DIAGNOSIS — E7800 Pure hypercholesterolemia, unspecified: Principal | ICD-10-CM

## 2021-02-12 DIAGNOSIS — G43009 Migraine without aura, not intractable, without status migrainosus: Principal | ICD-10-CM

## 2021-02-12 DIAGNOSIS — Z113 Encounter for screening for infections with a predominantly sexual mode of transmission: Principal | ICD-10-CM

## 2021-02-12 DIAGNOSIS — N644 Mastodynia: Principal | ICD-10-CM

## 2021-02-12 MED ORDER — OXYCODONE-ACETAMINOPHEN 10 MG-325 MG TABLET
ORAL_TABLET | Freq: Four times a day (QID) | ORAL | 0 refills | 30 days | Status: CP | PRN
Start: 2021-02-12 — End: ?

## 2021-02-12 MED ORDER — XHANCE 93 MCG/ACTUATION BREATH ACTIVATED AEROSOL
Freq: Two times a day (BID) | NASAL | 6 refills | 0 days | Status: CP | PRN
Start: 2021-02-12 — End: ?

## 2021-02-12 MED ORDER — RIZATRIPTAN 10 MG TABLET
ORAL_TABLET | 5 refills | 0 days | Status: CP
Start: 2021-02-12 — End: 2021-02-12

## 2021-02-15 DIAGNOSIS — E876 Hypokalemia: Principal | ICD-10-CM

## 2021-02-16 ENCOUNTER — Ambulatory Visit: Admit: 2021-02-16 | Discharge: 2021-02-17 | Payer: MEDICARE

## 2021-02-16 DIAGNOSIS — E876 Hypokalemia: Principal | ICD-10-CM

## 2021-02-16 MED ORDER — POTASSIUM CHLORIDE ER 20 MEQ TABLET,EXTENDED RELEASE(PART/CRYST)
ORAL_TABLET | 5 refills | 0 days | Status: CP
Start: 2021-02-16 — End: ?

## 2021-02-18 ENCOUNTER — Ambulatory Visit: Admit: 2021-02-18 | Discharge: 2021-02-19 | Payer: MEDICARE

## 2021-02-19 DIAGNOSIS — R600 Localized edema: Principal | ICD-10-CM

## 2021-02-19 MED ORDER — HYDROCHLOROTHIAZIDE 12.5 MG TABLET
ORAL_TABLET | 1 refills | 0 days | Status: CP
Start: 2021-02-19 — End: ?

## 2021-02-24 ENCOUNTER — Ambulatory Visit: Admit: 2021-02-24 | Discharge: 2021-02-24 | Payer: MEDICARE

## 2021-03-02 ENCOUNTER — Ambulatory Visit: Admit: 2021-03-02 | Discharge: 2021-03-02 | Payer: MEDICARE | Attending: Family Medicine | Primary: Family Medicine

## 2021-03-02 ENCOUNTER — Ambulatory Visit: Admit: 2021-03-02 | Discharge: 2021-03-02 | Payer: MEDICARE | Attending: Anesthesiology | Primary: Anesthesiology

## 2021-03-02 DIAGNOSIS — M7918 Myalgia, other site: Principal | ICD-10-CM

## 2021-03-02 DIAGNOSIS — R590 Localized enlarged lymph nodes: Principal | ICD-10-CM

## 2021-03-02 DIAGNOSIS — F1120 Opioid dependence, uncomplicated: Principal | ICD-10-CM

## 2021-03-03 ENCOUNTER — Ambulatory Visit: Admit: 2021-03-03 | Discharge: 2021-03-04 | Payer: MEDICARE

## 2021-03-09 ENCOUNTER — Telehealth: Admit: 2021-03-09 | Discharge: 2021-03-10 | Payer: MEDICARE | Attending: Psychologist | Primary: Psychologist

## 2021-03-09 DIAGNOSIS — F1190 Opioid use, unspecified, uncomplicated: Principal | ICD-10-CM

## 2021-03-09 DIAGNOSIS — F419 Anxiety disorder, unspecified: Principal | ICD-10-CM

## 2021-03-14 MED ORDER — OXYCODONE-ACETAMINOPHEN 10 MG-325 MG TABLET
ORAL_TABLET | Freq: Four times a day (QID) | ORAL | 0 refills | 30.00000 days | Status: CP | PRN
Start: 2021-03-14 — End: ?

## 2021-03-16 ENCOUNTER — Ambulatory Visit: Admit: 2021-03-16 | Discharge: 2021-03-17 | Payer: MEDICARE

## 2021-03-23 DIAGNOSIS — M7918 Myalgia, other site: Principal | ICD-10-CM

## 2021-03-23 MED ORDER — BACLOFEN 20 MG TABLET
ORAL_TABLET | Freq: Three times a day (TID) | ORAL | 2 refills | 35 days | Status: CP
Start: 2021-03-23 — End: 2021-04-22

## 2021-04-01 ENCOUNTER — Ambulatory Visit: Admit: 2021-04-01 | Discharge: 2021-04-02 | Payer: MEDICARE | Attending: Family Medicine | Primary: Family Medicine

## 2021-04-01 DIAGNOSIS — N6321 Unspecified lump in the left breast, upper outer quadrant: Principal | ICD-10-CM

## 2021-04-01 DIAGNOSIS — N644 Mastodynia: Principal | ICD-10-CM

## 2021-04-01 DIAGNOSIS — R590 Localized enlarged lymph nodes: Principal | ICD-10-CM

## 2021-04-02 DIAGNOSIS — G43019 Migraine without aura, intractable, without status migrainosus: Principal | ICD-10-CM

## 2021-04-02 DIAGNOSIS — G44221 Chronic tension-type headache, intractable: Principal | ICD-10-CM

## 2021-04-03 MED ORDER — BACLOFEN 20 MG TABLET
ORAL_TABLET | Freq: Three times a day (TID) | ORAL | 2 refills | 35 days | Status: CP
Start: 2021-04-03 — End: 2021-05-03

## 2021-04-04 MED ORDER — NORTRIPTYLINE 10 MG CAPSULE
ORAL_CAPSULE | 1 refills | 0 days | Status: CP
Start: 2021-04-04 — End: ?

## 2021-04-07 ENCOUNTER — Ambulatory Visit: Admit: 2021-04-07 | Discharge: 2021-04-08 | Payer: MEDICARE

## 2021-04-07 DIAGNOSIS — M5417 Radiculopathy, lumbosacral region: Principal | ICD-10-CM

## 2021-04-07 DIAGNOSIS — G894 Chronic pain syndrome: Principal | ICD-10-CM

## 2021-04-07 DIAGNOSIS — M7918 Myalgia, other site: Principal | ICD-10-CM

## 2021-04-07 MED ORDER — GABAPENTIN 800 MG TABLET
ORAL_TABLET | Freq: Two times a day (BID) | ORAL | 2 refills | 30 days | Status: CP
Start: 2021-04-07 — End: ?

## 2021-04-07 MED ORDER — BACLOFEN 20 MG TABLET
ORAL_TABLET | Freq: Three times a day (TID) | ORAL | 2 refills | 35 days | Status: CP
Start: 2021-04-07 — End: 2021-05-07

## 2021-04-13 ENCOUNTER — Ambulatory Visit: Admit: 2021-04-13 | Discharge: 2021-04-14 | Payer: MEDICARE | Attending: Family Medicine | Primary: Family Medicine

## 2021-04-13 DIAGNOSIS — N951 Menopausal and female climacteric states: Principal | ICD-10-CM

## 2021-04-13 DIAGNOSIS — E876 Hypokalemia: Principal | ICD-10-CM

## 2021-04-13 DIAGNOSIS — J0100 Acute maxillary sinusitis, unspecified: Principal | ICD-10-CM

## 2021-04-13 DIAGNOSIS — J3089 Other allergic rhinitis: Principal | ICD-10-CM

## 2021-04-13 DIAGNOSIS — N760 Acute vaginitis: Principal | ICD-10-CM

## 2021-04-13 MED ORDER — POTASSIUM CHLORIDE ER 20 MEQ TABLET,EXTENDED RELEASE(PART/CRYST)
ORAL_TABLET | Freq: Every day | ORAL | 2 refills | 90 days | Status: CP
Start: 2021-04-13 — End: ?

## 2021-04-13 MED ORDER — AMOXICILLIN 875 MG-POTASSIUM CLAVULANATE 125 MG TABLET
ORAL_TABLET | Freq: Two times a day (BID) | ORAL | 0 refills | 10 days | Status: CP
Start: 2021-04-13 — End: 2021-04-23

## 2021-04-13 MED ORDER — FLUCONAZOLE 150 MG TABLET
ORAL_TABLET | Freq: Once | ORAL | 0 refills | 2 days | Status: CP
Start: 2021-04-13 — End: 2021-04-13

## 2021-04-13 MED ORDER — XHANCE 93 MCG/ACTUATION BREATH ACTIVATED AEROSOL
Freq: Two times a day (BID) | NASAL | 6 refills | 0 days | Status: CP | PRN
Start: 2021-04-13 — End: ?

## 2021-04-13 MED ORDER — OXYCODONE-ACETAMINOPHEN 10 MG-325 MG TABLET
ORAL_TABLET | Freq: Four times a day (QID) | ORAL | 0 refills | 30 days | Status: CP | PRN
Start: 2021-04-13 — End: ?

## 2021-04-13 MED ORDER — AZELASTINE 137 MCG (0.1 %) NASAL SPRAY AEROSOL
Freq: Two times a day (BID) | NASAL | 12 refills | 0 days | Status: CP
Start: 2021-04-13 — End: ?

## 2021-04-13 MED ORDER — ESTRADIOL 0.5 MG TABLET
ORAL_TABLET | Freq: Every day | ORAL | 0 refills | 30 days | Status: CP
Start: 2021-04-13 — End: ?

## 2021-04-14 ENCOUNTER — Ambulatory Visit
Admit: 2021-04-14 | Payer: MEDICARE | Attending: Rehabilitative and Restorative Service Providers" | Primary: Rehabilitative and Restorative Service Providers"

## 2021-04-14 ENCOUNTER — Telehealth: Admit: 2021-04-14 | Discharge: 2021-04-15 | Payer: MEDICARE | Attending: Psychologist | Primary: Psychologist

## 2021-04-14 DIAGNOSIS — F1190 Opioid use, unspecified, uncomplicated: Principal | ICD-10-CM

## 2021-04-14 DIAGNOSIS — F419 Anxiety disorder, unspecified: Principal | ICD-10-CM

## 2021-04-14 MED ORDER — ESTRADIOL 0.5 MG TABLET
ORAL_TABLET | 0 refills | 0 days | Status: CP
Start: 2021-04-14 — End: ?

## 2021-04-14 MED ORDER — AZELASTINE 0.05 % EYE DROPS
Freq: Every day | OPHTHALMIC | 5 refills | 60 days | Status: CP | PRN
Start: 2021-04-14 — End: ?

## 2021-04-15 ENCOUNTER — Telehealth: Admit: 2021-04-15 | Discharge: 2021-04-16 | Payer: MEDICARE | Attending: Family Medicine | Primary: Family Medicine

## 2021-04-15 DIAGNOSIS — J0100 Acute maxillary sinusitis, unspecified: Principal | ICD-10-CM

## 2021-04-15 DIAGNOSIS — J040 Acute laryngitis: Principal | ICD-10-CM

## 2021-04-15 DIAGNOSIS — H938X3 Other specified disorders of ear, bilateral: Principal | ICD-10-CM

## 2021-04-15 DIAGNOSIS — J3089 Other allergic rhinitis: Principal | ICD-10-CM

## 2021-04-15 DIAGNOSIS — R499 Unspecified voice and resonance disorder: Principal | ICD-10-CM

## 2021-04-15 MED ORDER — PREDNISONE 10 MG TABLET
ORAL_TABLET | 0 refills | 0 days | Status: CP
Start: 2021-04-15 — End: ?

## 2021-04-23 ENCOUNTER — Ambulatory Visit: Admit: 2021-04-23 | Discharge: 2021-04-24 | Payer: MEDICARE | Attending: Anesthesiology | Primary: Anesthesiology

## 2021-04-23 DIAGNOSIS — M7918 Myalgia, other site: Principal | ICD-10-CM

## 2021-05-05 ENCOUNTER — Telehealth: Admit: 2021-05-05 | Discharge: 2021-05-06 | Payer: MEDICARE | Attending: Family Medicine | Primary: Family Medicine

## 2021-05-05 DIAGNOSIS — J3089 Other allergic rhinitis: Principal | ICD-10-CM

## 2021-05-05 DIAGNOSIS — H1013 Acute atopic conjunctivitis, bilateral: Principal | ICD-10-CM

## 2021-05-05 DIAGNOSIS — R590 Localized enlarged lymph nodes: Principal | ICD-10-CM

## 2021-05-05 DIAGNOSIS — R221 Localized swelling, mass and lump, neck: Principal | ICD-10-CM

## 2021-05-05 MED ORDER — XHANCE 93 MCG/ACTUATION BREATH ACTIVATED AEROSOL
Freq: Two times a day (BID) | NASAL | 6 refills | 0 days | Status: CP | PRN
Start: 2021-05-05 — End: ?

## 2021-05-05 MED ORDER — OLOPATADINE 0.1 % EYE DROPS
Freq: Two times a day (BID) | OPHTHALMIC | 1 refills | 50 days | Status: CP
Start: 2021-05-05 — End: 2022-05-05

## 2021-05-06 ENCOUNTER — Institutional Professional Consult (permissible substitution): Admit: 2021-05-06 | Discharge: 2021-05-07 | Payer: MEDICARE

## 2021-05-06 DIAGNOSIS — Z111 Encounter for screening for respiratory tuberculosis: Principal | ICD-10-CM

## 2021-05-08 ENCOUNTER — Institutional Professional Consult (permissible substitution): Admit: 2021-05-08 | Discharge: 2021-05-09 | Payer: MEDICARE

## 2021-05-12 ENCOUNTER — Ambulatory Visit: Admit: 2021-05-12 | Discharge: 2021-05-13 | Payer: MEDICARE | Attending: Internal Medicine | Primary: Internal Medicine

## 2021-05-12 DIAGNOSIS — J301 Allergic rhinitis due to pollen: Principal | ICD-10-CM

## 2021-05-12 DIAGNOSIS — R61 Generalized hyperhidrosis: Principal | ICD-10-CM

## 2021-05-12 DIAGNOSIS — R6889 Other general symptoms and signs: Principal | ICD-10-CM

## 2021-05-12 DIAGNOSIS — R591 Generalized enlarged lymph nodes: Principal | ICD-10-CM

## 2021-05-12 DIAGNOSIS — L508 Other urticaria: Principal | ICD-10-CM

## 2021-05-12 MED ORDER — XOLAIR 150 MG/ML SUBCUTANEOUS SYRINGE
SUBCUTANEOUS | 12 refills | 28 days | Status: CP
Start: 2021-05-12 — End: ?
  Filled 2021-05-14: qty 2, 28d supply, fill #0

## 2021-05-13 LAB — ANA: ANTINUCLEAR ANTIBODIES (ANA): NEGATIVE

## 2021-05-13 MED ORDER — OXYCODONE-ACETAMINOPHEN 10 MG-325 MG TABLET
ORAL_TABLET | Freq: Four times a day (QID) | ORAL | 0 refills | 30 days | Status: CP | PRN
Start: 2021-05-13 — End: ?

## 2021-05-13 NOTE — Unmapped (Signed)
[facility] Specialty Medication Onboarding    Specialty Medication: Xolair  Prior Authorization: Not Required   Financial Assistance: No - copay  <$25  Final Copay/Day Supply: $0 / 28    Insurance Restrictions: None     Notes to Pharmacist: None    The triage team has completed the benefits investigation and has determined that the patient is able to fill this medication at [facility]. Please contact the patient to complete the onboarding or follow up with the prescribing physician as needed.

## 2021-05-14 LAB — PROTEIN ELECTROPHORESIS, SERUM
ALBUMIN (SPE): 4.1 g/dL (ref 3.5–5.0)
ALPHA-1 GLOBULIN: 0.3 g/dL (ref 0.2–0.5)
ALPHA-2 GLOBULIN: 0.7 g/dL (ref 0.5–1.1)
BETA-1 GLOBULIN: 0.4 g/dL (ref 0.3–0.6)
BETA-2 GLOBULIN: 0.5 g/dL (ref 0.2–0.6)
GAMMAGLOBULIN: 1.4 g/dL (ref 0.5–1.5)
PROTEIN TOTAL: 7.4 g/dL

## 2021-05-14 NOTE — Unmapped (Signed)
[facility]   Patient Onboarding/Medication Counseling    Natalie [name] is a 51 y.o. female with chronic urticaria who I am counseling today on initiation of therapy.  I am speaking to the patient.    Was a Nurse, learning disability used for this call? No    Verified patient's date of birth / HIPAA.    Specialty medication(s) to be sent: Inflammatory Disorders: Xolair      Non-specialty medications/supplies to be sent: n/a - Pt confirmed having an Epi-pen at home      Medications not needed at this time: n/a       Xolair Syringes (omalizumab)    Medication & Administration     Dosage: Inject 300 mg under the skin every 28 days    Administration:     Prefilled Syringe:   1. Gather all supplies needed for injection on a clean, flat working surface: medication syringe(s) removed from packaging, alcohol swab, sharps container, etc.  2. Look at the medication label - look for correct medication, correct dose, and check the expiration date  3. Look at the medication - the liquid in the syringe should appear clear and colorless to slightly yellow  4. Lay the syringe on a flat surface and allow it to warm up to room temperature for at least 15-30 minutes  5. Select injection site - you can use the front of your thigh or your belly (but not the area 2 inches around your belly button); if someone else is giving you the injection you can also use your upper arm in the skin covering your triceps muscle or in the buttocks  6. Prepare injection site - wash your hands and clean the skin at the injection site with an alcohol swab and let it air dry, do not touch the injection site again before the injection  7. Pull off the needle safety cap, do not remove until immediately prior to injection  8. Pinch the skin - with your hand not holding the syringe pinch up a fold of skin at the injection site using your forefinger and thumb  9. Insert the needle into the fold of skin at about a 45 degree angle - it's best to use a quick dart-like motion  10. Push the plunger down slowly as far as it will go until the syringe is empty, if the plunger is not fully depressed the needle shield will not extend to cover the needle when it is removed, hold the syringe in place for a full 5 seconds  11. Check that the syringe is empty and keep pressing down on the plunger while you pull the needle out at the same angle as inserted; after the needle is removed completely from the skin, release the plunger allowing the needle shield to activate and cover the used needle  12. Dispose of the used syringe immediately in your sharps disposal container, do not attempt to recap the needle prior to disposing  13. If you see any blood at the injection site, press a cotton ball or gauze on the site and maintain pressure until the bleeding stops, do not rub the injection site    Adherence/Missed dose instructions: If you miss a dose take as soon as you remember.  Resume the correct dosing schedule.        Goals of Therapy     ??? To treat asthma and or chronic idiopathic urticaria    Side Effects & Monitoring Parameters     Commonly reported  side effects  ??? Headache  ??? Nausea, vomiting   ??? Injection site reaction  ??? Loss of strength and energy  ??? Common cold symptoms, sore throat, stuffy nose  ??? Ear pain  ??? Painful extremities     The following side effects should be reported to the provider:  ??? Signs of cerebrovascular disease (change in strength on one side is greater than the other, trouble speaking or thinking, change in balance or vision changes)  ??? Signs of DVT (swelling, warmth, numbness, change in color or pain in extremities)  ??? Signs of anaphylaxis (wheezing, chest tightness, swelling of face, lips, tongue or throat)    Monitoring Parameters:   ??? Anaphylactic/hypersensitivity reactions (observe patients for 2 hours after the first 3 injections and 30 minutes after subsequent injections or in accordance with individual institution policies and procedures); ??? Baseline serum total IgE; FEV1, peak flow, and/or other pulmonary function tests  ??? Monitor for signs of infection    Contraindications, Warnings, & Precautions   [place] Boxed Warning]: Anaphylaxis, including delayed-onset anaphylaxis, has been reported following administration; anaphylaxis may present as bronchospasm, hypotension, syncope, urticaria, and/or angioedema of the throat or tongue. Anaphylaxis has occurred after the first dose and in some cases >1 year after initiation of regular treatment. Due to the risk, patients should be observed closely for an appropriate time period after administration and should receive treatment only under direct medical supervision. Healthcare providers should be prepared to administer appropriate therapy for managing potentially life-threatening anaphylaxis. Patients should be instructed on identifying signs/symptoms of anaphylaxis and to seek immediate care if they arise.      Contraindications  ??? Severe hypersensitivity reaction to omalizumab or any component of the formulation    Warnings & Precautions  ??? Cardiovascular effects: Cerebrovascular events, including transient ischemic attack and ischemic stroke, have been reported.  ??? Eosinophilia and vasculitis: In rare cases, patients may present with systemic eosinophilia, sometimes presenting with clinical features of vasculitis.    ??? Fever/arthralgia/rash: Reports of a constellation of symptoms including fever, arthritis or arthralgia, rash, and lymphadenopathy have been reported with post-marketing use.  ??? Malignant neoplasms: Have been reported rarely with use in short-term studies; impact of long-term use is not known.  ??? Parasitic infections: Use with caution and monitor patients at high risk for parasitic infections; risk of infection may be increased; appropriate duration of continued monitoring following therapy discontinuation has not been established.  ??? Corticosteroid therapy: Gradually taper systemic or inhaled corticosteroid therapy; do not discontinue corticosteroids abruptly following initiation of omalizumab therapy. The combined use of omalizumab and corticosteroids in patients with chronic idiopathic urticaria has not been evaluated.  ??? Latex: Prefilled syringe: The needle cap may contain natural rubber latex.  ??? Appropriate use: Therapy has not been shown to alleviate acute asthma exacerbations; do not use to treat acute bronchospasm, status asthmaticus, or other allergic conditions. Do not use to treat forms of urticaria other than chronic idiopathic urticaria.  ??? Dosing/IgE levels: Dosing for asthma is based on body weight and pretreatment total IgE serum levels. IgE levels remain elevated up to 1 year following treatment; therefore, levels taken during treatment or for up to 1 year following treatment cannot and should not be used as a dosage guide. Dosing in chronic idiopathic urticaria is not dependent on serum IgE (free or total) level or body weight.  ??? Pregnancy Considerations: Omalizumab is a humanized monoclonal antibody (IgG1). Potential placental transfer of human IgG is dependent upon the IgG subclass and  gestational age, generally increasing as pregnancy progresses.  ??? Breastfeeding Considerations: It is not known if omalizumab is present in breast milk; however, IgG is excreted in human milk.  Based on information from the pregnancy exposure registry, an increased risk of adverse events was not observed in breastfed infants of mothers using omalizumab. According to the manufacturer, the decision to breastfeed during therapy should consider the risk of infant exposure, the benefits of breastfeeding to the infant, and benefits of treatment to the mother      Drug/Food Interactions     ??? Medication list reviewed in Epic. The patient was instructed to inform the care team before taking any new medications or supplements. No drug interactions identified.     Storage, Handling Precautions, & Disposal ??? Store this medication in the refrigerator, 2??C to 8??C (36??F to 46??F). in the original carton.  Protect from direct sunlight and do not freeze. Must be used within 4 hours after removal from refrigerator.  ??? Do not shake.  ??? Dispose of used syringes in a sharps disposal container.      Current Medications (including OTC/herbals), Comorbidities and Allergies     Current Outpatient Medications   Medication Sig Dispense Refill   ??? albuterol HFA 90 mcg/actuation inhaler Inhale 2 puffs every four (4) hours as needed for wheezing. 54 g 0   ??? azelastine (ASTELIN) 137 mcg (0.1 %) nasal spray 2 sprays into each nostril Two (2) times a day. 30 mL 12   ??? azelastine (OPTIVAR) 0.05 % ophthalmic solution Administer 2 drops to both eyes daily as needed. 6 mL 5   ??? cetirizine (ZYRTEC) 10 MG tablet      ??? diclofenac sodium (PENNSAID) 20 mg/gram /actuation(2 %) sopm Apply 2 pumps to affected area up to four times a day. 112 g 2   ??? EPINEPHrine (EPIPEN) 0.3 mg/0.3 mL injection Inject 0.3 mL (0.3 mg total) into the muscle once for 1 dose. 2 each 0   ??? estradioL (ESTRACE) 0.5 MG tablet TAKE 1 TABLET(0.5 MG) BY MOUTH DAILY 90 tablet 0   ??? gabapentin (NEURONTIN) 800 MG tablet Take 1 tablet (800 mg total) by mouth Two (2) times a day. 60 tablet 2   ??? hydroCHLOROthiazide (HYDRODIURIL) 12.5 MG tablet TAKE 1 TABLET(12.5 MG) BY MOUTH DAILY AS NEEDED FOR SWELLING 90 tablet 1   ??? montelukast (SINGULAIR) 10 mg tablet      ??? naloxone (NARCAN) 4 mg nasal spray One spray in either nostril once for known/suspected opioid overdose. May repeat every 2-3 minutes in alternating nostril til EMS arrives 1 each 0   ??? nortriptyline (PAMELOR) 25 MG capsule Take 1 capsule (25 mg total) by mouth nightly. 90 capsule 1   ??? olopatadine (PATANOL) 0.1 % ophthalmic solution Administer 1 drop to both eyes Two (2) times a day. 5 mL 1   ??? omalizumab (XOLAIR) 150 mg/mL syringe Inject the contents of 1 syringes (300 mg total) under the skin every twenty-eight (28) days. 2 mL 12   ??? oxyCODONE-acetaminophen (PERCOCET) 10-325 mg per tablet Take 1 tablet by mouth every six (6) hours as needed for pain. Max 4 tabs/day. Fill on or after: 04/13/2021. Brand name only due to allergy. 120 tablet 0   ??? oxyCODONE-acetaminophen (PERCOCET) 10-325 mg per tablet Take 1 tablet by mouth every six (6) hours as needed for pain. Max 4 tabs/day. Fill on or after: 05/13/2021. Brand name only due to allergy. 120 tablet 0   ??? [START ON 06/12/2021] oxyCODONE-acetaminophen (  PERCOCET) 10-325 mg per tablet Take 1 tablet by mouth every six (6) hours as needed for pain. Max 4 tabs/day. Fill on or after: 06/12/2021. Brand name only due to allergy. 120 tablet 0   ??? pantoprazole (PROTONIX) 40 MG tablet TAKE 1 TABLET(40 MG) BY MOUTH DAILY 30 tablet 4   ??? potassium chloride 20 MEQ CR tablet Take 1 tablet (20 mEq total) by mouth daily. 90 tablet 2   ??? predniSONE (DELTASONE) 10 MG tablet Take 6 pills on day 1, 5 pills on day 2, 4 pills on day 3, 3 pills on day 4, 2 pills on day 5, and 1 pill on day 6 21 tablet 0   ??? PROCHAMBER Spcr      ??? rizatriptan (MAXALT-MLT) 10 MG disintegrating tablet Take 1 tablet by mouth at ONSET of migraine or aura. May repeat in 2 HOURS if needed. DO NOT EXCEED 2 doses/day, 2 days/week, 8 doses/month. 10 tablet 5   ??? rosuvastatin (CRESTOR) 10 MG tablet Take 1 tablet (10 mg total) by mouth nightly. 90 tablet 1   ??? triamcinolone (KENALOG) 0.1 % cream Apply 1 application topically two (2) times a day as needed. WHEN ALLERGIES FLARE-UP AND CAUSES RASH / ITCHING 80 g 0   ??? XHANCE 93 mcg/actuation AerB 1 spray into each nostril two (2) times a day as needed. 16 mL 6     No current facility-administered medications for this visit.       Allergies   Allergen Reactions   ??? Buprenorphine Hcl Itching and Swelling   ??? Pregabalin Swelling   ??? Adhesive Tape-Silicones Itching     Band-aids ok.   ??? Doxycycline Hyclate (Bulk) Nausea And Vomiting     GI Upset   ??? Keflex [Cephalexin] Rash   ??? Opioids - Morphine Analogues Itching   ??? Oxycodone-Acetaminophen Itching and Nausea And Vomiting     GI Upset- GENERIC ONLY- able to take the brand name Percocets       Patient Active Problem List   Diagnosis   ??? Hypothyroidism   ??? Chronic pain syndrome   ??? Encounter for Medicare annual wellness exam   ??? Premature menopause   ??? Facet arthritis of lumbar region   ??? Pseudotumor cerebri   ??? Allergic rhinitis   ??? Arthritis of wrist, right   ??? Primary osteoarthritis of both knees   ??? Trochanteric bursitis of both hips   ??? Migraine without aura and without status migrainosus, not intractable   ??? Myofascial pain syndrome   ??? Allergic conjunctivitis   ??? Rash due to allergy   ??? Uncomplicated opioid dependence (CMS-HCC)   ??? Obesity (BMI 30-39.9)   ??? Educated about COVID-19 virus infection   ??? Chronic tension-type headache, intractable   ??? Greater trochanteric bursitis of right hip   ??? Primary osteoarthritis of right knee   ??? Primary osteoarthritis of left knee   ??? Pain medication agreement signed       Reviewed and up to date in Epic.    Appropriateness of Therapy     Acute infections noted within Epic:  No active infections  Patient reported infection: None    Is medication and dose appropriate based on diagnosis and infection status? Yes    Prescription has been clinically reviewed: Yes      Baseline Quality of Life Assessment      How many days over the past month did your chronic urticaria  keep you from your normal activities? For example,  brushing your teeth or getting up in the morning. Natalie [name] reports experiencing hives several times a week. She describes hives as itchy and bothersome.      Financial Information     Medication Assistance provided: None Required    Anticipated copay of $0 / 28 days reviewed with patient. Verified delivery address.    Delivery Information     Scheduled delivery date: 05/15/21    Expected start date: TBD - pt will need to schedule an appt to receive Xolair in clinic    Medication will be delivered via [facility] to the temporary address in Epic [place].  This shipment will not require a signature.      Explained the services we provide at [facility] and that each month we would call to set up refills.  Stressed importance of returning phone calls so that we could ensure they receive their medications in time each month.  Informed patient that we should be setting up refills 7-10 days prior to when they will run out of medication.  A pharmacist will reach out to perform a clinical assessment periodically.  Informed patient that a welcome packet, containing information about our pharmacy and other support services, a Notice of Privacy Practices, and a drug information handout will be sent.      The patient or caregiver noted above participated in the development of this care plan and knows that they can request review of or adjustments to the care plan at any time.      Patient or caregiver verbalized understanding of the above information as well as how to contact the pharmacy at [phone] option 4 with any questions/concerns.  The pharmacy is open Monday through Friday 8:30am-4:30pm.  A pharmacist is available 24/7 via pager to answer any clinical questions they may have.    Patient Specific Needs     - Does the patient have any physical, cognitive, or cultural barriers? No    - Does the patient have adequate living arrangements? (i.e. the ability to store and take their medication appropriately) Yes    - Did you identify any home environmental safety or security hazards? No    - Patient prefers to have medications discussed with  Patient     - Is the patient or caregiver able to read and understand education materials at a high school level or above? Yes    - Patient's primary language is  English     - Is the patient high risk? No    SOCIAL DETERMINANTS OF HEALTH     At the [facility], we have learned that life circumstances - like trouble affording food, housing, utilities, or transportation can affect the health of many of our patients.   That is why we wanted to ask: are you currently experiencing any life circumstances that are negatively impacting your health and/or quality of life? Patient declined to answer    Social Determinants of Health     Financial Resource Strain: Not on file   Internet Connectivity: Not on file   Food Insecurity: Food Insecurity Present   ??? Worried About Programme researcher, broadcasting/film/video in the Last Year: Sometimes true   ??? Ran Out of Food in the Last Year: Sometimes true   Tobacco Use: Low Risk    ??? Smoking Tobacco Use: Never   ??? Smokeless Tobacco Use: Never   ??? Passive Exposure: Past   Housing/Utilities: High Risk   ???  Within the past 12 months, have you ever stayed: outside, in a car, in a tent, in an overnight shelter, or temporarily in someone else's home (i.e. couch-surfing)?: No   ??? Are you worried about losing your housing?: Yes   ??? Within the past 12 months, have you been unable to get utilities (heat, electricity) when it was really needed?: Yes   Alcohol Use: Not on file   Transportation Needs: Not on file   Substance Use: Not on file   Health Literacy: Low Risk    ??? : Never   Physical Activity: Not on file   Interpersonal Safety: Not on file   Stress: Not on file   Intimate Partner Violence: Not on file   Depression: Not on file   Social Connections: Not on file       Would you be willing to receive help with any of the needs that you have identified today? Not applicable       Oliva Bustard  [facility] Specialty Pharmacist

## 2021-05-14 NOTE — Unmapped (Signed)
This patient is receiving Xolair as a clinic administered medication. Patient is aware of copay of $0. The patient prefers all future communication to occur with the clinic. This patient is being disenrolled from our specialty management program and will be added to a patient list for appropriate follow up.

## 2021-05-15 DIAGNOSIS — J3089 Other allergic rhinitis: Principal | ICD-10-CM

## 2021-05-15 MED ORDER — MONTELUKAST 10 MG TABLET
ORAL_TABLET | Freq: Every day | ORAL | 6 refills | 30 days | Status: CP
Start: 2021-05-15 — End: ?

## 2021-05-15 NOTE — Unmapped (Signed)
Unable to complete refill request, last Rx written by historical provider, need a new script written please. Natalie [name]   Please advise on how to proceed.Patient is requesting the following refill  Requested Prescriptions     Pending Prescriptions Disp Refills    montelukast (SINGULAIR) 10 mg tablet [Pharmacy Med Name: MONTELUKAST 10MG  TABLETS] 30 tablet 0     Sig: TAKE 1 TABLET(10 MG) BY MOUTH DAILY       Recent Visits  Date Type Provider Dept   05/05/21 Telemedicine Venkat Rama Dallas Breeding, MD [place] [facility]   04/15/21 Telemedicine Venkat Rama Dallas Breeding, MD [place] [facility]   04/13/21 Office Visit Venkat Rama Dallas Breeding, MD [place] [facility]   04/01/21 Office Visit Venkat Rama Dallas Breeding, MD [place] [facility]   03/02/21 Office Visit Venkat Rama Dallas Breeding, MD [place] [facility]   02/12/21 Office Visit Venkat Rama Dallas Breeding, MD [place] [facility]   12/23/20 Office Visit Venkat Rama Dallas Breeding, MD [place] [facility]   09/25/20 Telemedicine Venkat Rama Dallas Breeding, MD [place] [facility]   08/26/20 Office Visit Kaddijatou Henriette Combs, PA [place] [facility]   07/01/20 Telemedicine Kaddijatou Henriette Combs, PA [place] [facility]   Showing recent visits within past 365 days with a meds authorizing provider and meeting all other requirements  Future Appointments  No visits were found meeting these conditions.  Showing future appointments within next 365 days with a meds authorizing provider and meeting all other requirements       Labs: Not applicable this refill

## 2021-05-25 ENCOUNTER — Ambulatory Visit
Admit: 2021-05-25 | Discharge: 2021-05-26 | Payer: MEDICARE | Attending: Orthopaedic Surgery | Primary: Orthopaedic Surgery

## 2021-05-25 DIAGNOSIS — M79675 Pain in left toe(s): Principal | ICD-10-CM

## 2021-05-25 MED ORDER — IBUPROFEN 600 MG TABLET
ORAL_TABLET | Freq: Three times a day (TID) | ORAL | 2 refills | 20 days | Status: CP | PRN
Start: 2021-05-25 — End: 2021-06-24

## 2021-05-25 MED ORDER — NAPROXEN 500 MG TABLET
ORAL_TABLET | Freq: Two times a day (BID) | ORAL | 1 refills | 30 days | Status: CP
Start: 2021-05-25 — End: 2021-05-25

## 2021-05-25 NOTE — Unmapped (Signed)
Name: Natalie [name]  Date of Birth: 23-Oct-1970  Encounter Date: 05/25/21    Subjective:  HPI: 51 y.o. female presents for initial evaluation of pain of the left big toe. 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 She states that the pain in her left great toe has been ongoing for about 6 weeks without significant injury. She states her pain increases the longer she is on her feet or when she accidentally bumps against her toe. She takes oxycodone for her back pain, and it does not help with the pain in her toe.    I have reviewed past medical, surgical, medications, allergies, social and family histories today and updated them in Epic where appropriate.    ROS:  Review of systems negative unless otherwise noted as per HPI.    Allergies:  Buprenorphine hcl, Pregabalin, Adhesive tape-silicones, Doxycycline hyclate (bulk), Keflex [cephalexin], Opioids - morphine analogues, and Oxycodone-acetaminophen  Medications:     Current Outpatient Medications:   ???  albuterol HFA 90 mcg/actuation inhaler, Inhale 2 puffs every four (4) hours as needed for wheezing., Disp: 54 g, Rfl: 0  ???  azelastine (ASTELIN) 137 mcg (0.1 %) nasal spray, 2 sprays into each nostril Two (2) times a day., Disp: 30 mL, Rfl: 12  ???  azelastine (OPTIVAR) 0.05 % ophthalmic solution, Administer 2 drops to both eyes daily as needed., Disp: 6 mL, Rfl: 5  ???  cetirizine (ZYRTEC) 10 MG tablet, , Disp: , Rfl:   ???  diclofenac sodium (PENNSAID) 20 mg/gram /actuation(2 %) sopm, Apply 2 pumps to affected area up to four times a day., Disp: 112 g, Rfl: 2  ???  EPINEPHrine (EPIPEN) 0.3 mg/0.3 mL injection, Inject 0.3 mL (0.3 mg total) into the muscle once for 1 dose., Disp: 2 each, Rfl: 0  ???  estradioL (ESTRACE) 0.5 MG tablet, TAKE 1 TABLET(0.5 MG) BY MOUTH DAILY, Disp: 90 tablet, Rfl: 0  ???  gabapentin (NEURONTIN) 800 MG tablet, Take 1 tablet (800 mg total) by mouth Two (2) times a day., Disp: 60 tablet, Rfl: 2  ??? hydroCHLOROthiazide (HYDRODIURIL) 12.5 MG tablet, TAKE 1 TABLET(12.5 MG) BY MOUTH DAILY AS NEEDED FOR SWELLING, Disp: 90 tablet, Rfl: 1  ???  montelukast (SINGULAIR) 10 mg tablet, Take 1 tablet (10 mg total) by mouth daily. TAKE 1 TABLET(10 MG) BY MOUTH DAILY, Disp: 30 tablet, Rfl: 6  ???  naloxone (NARCAN) 4 mg nasal spray, One spray in either nostril once for known/suspected opioid overdose. May repeat every 2-3 minutes in alternating nostril til EMS arrives, Disp: 1 each, Rfl: 0  ???  nortriptyline (PAMELOR) 25 MG capsule, Take 1 capsule (25 mg total) by mouth nightly., Disp: 90 capsule, Rfl: 1  ???  olopatadine (PATANOL) 0.1 % ophthalmic solution, Administer 1 drop to both eyes Two (2) times a day., Disp: 5 mL, Rfl: 1  ???  omalizumab (XOLAIR) 150 mg/mL syringe, Inject the contents of 2 syringes (300 mg total) under the skin every twenty-eight (28) days., Disp: 2 mL, Rfl: 12  ???  oxyCODONE-acetaminophen (PERCOCET) 10-325 mg per tablet, Take 1 tablet by mouth every six (6) hours as needed for pain. Max 4 tabs/day. Fill on or after: 04/13/2021. Brand name only due to allergy., Disp: 120 tablet, Rfl: 0  ???  oxyCODONE-acetaminophen (PERCOCET) 10-325 mg per tablet, Take 1 tablet by mouth every six (6) hours as needed for pain. Max 4 tabs/day. Fill on or after: 05/13/2021. Brand name only due to  allergy., Disp: 120 tablet, Rfl: 0  ???  [START ON 06/12/2021] oxyCODONE-acetaminophen (PERCOCET) 10-325 mg per tablet, Take 1 tablet by mouth every six (6) hours as needed for pain. Max 4 tabs/day. Fill on or after: 06/12/2021. Brand name only due to allergy., Disp: 120 tablet, Rfl: 0  ???  pantoprazole (PROTONIX) 40 MG tablet, TAKE 1 TABLET(40 MG) BY MOUTH DAILY, Disp: 30 tablet, Rfl: 4  ???  potassium chloride 20 MEQ CR tablet, Take 1 tablet (20 mEq total) by mouth daily., Disp: 90 tablet, Rfl: 2  ???  predniSONE (DELTASONE) 10 MG tablet, Take 6 pills on day 1, 5 pills on day 2, 4 pills on day 3, 3 pills on day 4, 2 pills on day 5, and 1 pill on day 6, Disp: 21 tablet, Rfl: 0  ???  PROCHAMBER Spcr, , Disp: , Rfl:   ???  rizatriptan (MAXALT-MLT) 10 MG disintegrating tablet, Take 1 tablet by mouth at ONSET of migraine or aura. May repeat in 2 HOURS if needed. DO NOT EXCEED 2 doses/day, 2 days/week, 8 doses/month., Disp: 10 tablet, Rfl: 5  ???  rosuvastatin (CRESTOR) 10 MG tablet, Take 1 tablet (10 mg total) by mouth nightly., Disp: 90 tablet, Rfl: 1  ???  triamcinolone (KENALOG) 0.1 % cream, Apply 1 application topically two (2) times a day as needed. WHEN ALLERGIES FLARE-UP AND CAUSES RASH / ITCHING, Disp: 80 g, Rfl: 0  ???  XHANCE 93 mcg/actuation AerB, 1 spray into each nostril two (2) times a day as needed., Disp: 16 mL, Rfl: 6   Past Medical/Surgical:   Past Medical History:   Diagnosis Date   ??? Allergic conjunctivitis 11/23/2018   ??? Anemia 1986   ??? Arthritis    ??? Breast mass    ??? Chondromalacia of left knee    ??? Chondromalacia of right knee    ??? Depressive disorder    ??? Fibrocystic breast    ??? GERD (gastroesophageal reflux disease)    ??? Hematuria    ??? History of kidney stones    ??? History of transfusion    ??? Hyperlipidemia 1999   ??? Hypothyroidism    ??? Lumbar disc disease 1998    She was injured at work at the age of 46 and then had disk surgery 2 years later    ??? Menopause ovarian failure    ??? Menopause, premature    ??? Migraine with aura    ??? Neuromuscular disorder (CMS-HCC) 1999   ??? Obesity (BMI 30-39.9) 11/23/2018   ??? Ovarian cyst    ??? Plantar fasciitis    ??? Pseudotumor cerebri    ??? Rash due to allergy 11/23/2018   ??? Sickle cell trait (CMS-HCC)    ??? Urinary tract infection    ??? Vaginitis      Past Surgical History:   Procedure Laterality Date   ??? BACK SURGERY  2001   ??? BREAST BIOPSY Bilateral     When patient was 16 & 17 Both Negative   ??? HYSTERECTOMY  2008   ??? PR REMOVAL OF TONSILS,12+ Y/O Bilateral 10/10/2019    Procedure: TONSILLECTOMY;  Surgeon: Lona Millard, MD;  Location: OR [place];  Service: ENT   ??? SALPINGOOPHORECTOMY Bilateral 2007   ??? SPINE SURGERY  2001   ??? TOTAL VAGINAL HYSTERECTOMY  01/04/2006    Uterine Prolapse-Dr. Leeanne Rio OP Note   ??? TUBAL LIGATION  1995     Social History:  Social History     Tobacco Use   ???  Smoking status: Never     Passive exposure: Past   ??? Smokeless tobacco: Never   Vaping Use   ??? Vaping Use: Never used   Substance Use Topics   ??? Alcohol use: Never   ??? Drug use: Never       Objective:       05/25/21 1445   Weight: 73 kg (161 lb)   Height: 154.9 cm (5' 1)     Body mass index is 30.42 kg/m??.    Physical Exam:    Left foot: Tenderness at the great toe first MCP joint but no significant warmth or swelling; tenderness great toe IP joint with discomfort on maximal internal flexion of the IP joint and first MCP joint; no significant crepitus; no significant limitation range of motion  Imaging:   XR Foot 3 Or More Views Left  AP, lateral, oblique view left foot indicates normal bony alignment   without evidence of acute fracture or dislocation; no degenerative   sclerosis       Assessment & Plan:     ICD-10-CM   1. Great toe pain, left  M79.675        Assessment/Plan: Left great toe pain-first MTP joint capsulitis  Patient will need to decrease her high-impact activity by 50% over the next 6 weeks.  She will begin ibuprofen 600 mg twice or 3 times daily over the next 3 days with weaning to once daily in 2 weeks.  She will begin Epsom salt soaks in warm water 20 minutes nightly over the next 5 days    She will use firm soled shoes to help take pressure off her forefoot.  If ongoing difficulty in a month we will place her in a postop shoe to decrease the pressure in the forefoot  No surgical intervention is indicated        Return in about 8 weeks (around 07/20/2021) for with Lanora Manis.    Bernadene Person, MD

## 2021-05-28 ENCOUNTER — Ambulatory Visit: Admit: 2021-05-28 | Discharge: 2021-05-29 | Payer: MEDICARE | Attending: Sports Medicine | Primary: Sports Medicine

## 2021-05-28 DIAGNOSIS — M25562 Pain in left knee: Principal | ICD-10-CM

## 2021-05-28 DIAGNOSIS — G8929 Other chronic pain: Principal | ICD-10-CM

## 2021-05-28 MED ADMIN — BUPivacaine (PF) (MARCAINE) 0.5 % (5 mg/mL) injection (PF) 5 mL: 5 mL | INTRA_ARTICULAR | @ 18:00:00 | Stop: 2021-05-28

## 2021-05-28 MED ADMIN — triamcinolone acetonide (KENALOG) injection 20 mg: 20 mg | INTRA_ARTICULAR | @ 18:00:00 | Stop: 2021-05-28

## 2021-05-28 NOTE — Unmapped (Signed)
PATIENT: Natalie [name]      AGE: 51 y.o.     DOB: November 21, 1970    Medical Record Number:  [id number]            Date of Exam:  05/28/2021      Primary Care Provider:  Bernita Raisin, MD       Chief Complaint     Chief Complaint   Patient presents with    Left Knee - Pain        History Of Present Illness   Natalie [name] is a  51 y.o.  female who presents today for evaluation of left knee.  She has been complaining of pain in her knee for the past several years.  She has managed this with viscosupplementation as well as corticosteroid injections.  She underwent her last Visco supplementation nearly a year ago.  It was not quite as effective but she did get relief.  Her prior injections lasted about 2 years.  She complains of pain and stiffness in the knee.  The pain is achy and dull but can become sharp at times.  She is felt some discomfort going up and down stairs as well as pivoting.  She has used naproxen as well as Percocet at times to manage the symptoms.          Home Medications     Current Outpatient Medications:     azelastine (ASTELIN) 137 mcg (0.1 %) nasal spray, 2 sprays into each nostril Two (2) times a day., Disp: 30 mL, Rfl: 12    azelastine (OPTIVAR) 0.05 % ophthalmic solution, Administer 2 drops to both eyes daily as needed., Disp: 6 mL, Rfl: 5    cetirizine (ZYRTEC) 10 MG tablet, , Disp: , Rfl:     diclofenac sodium (PENNSAID) 20 mg/gram /actuation(2 %) sopm, Apply 2 pumps to affected area up to four times a day., Disp: 112 g, Rfl: 2    estradioL (ESTRACE) 0.5 MG tablet, TAKE 1 TABLET(0.5 MG) BY MOUTH DAILY, Disp: 90 tablet, Rfl: 0    gabapentin (NEURONTIN) 800 MG tablet, Take 1 tablet (800 mg total) by mouth Two (2) times a day., Disp: 60 tablet, Rfl: 2    hydroCHLOROthiazide (HYDRODIURIL) 12.5 MG tablet, TAKE 1 TABLET(12.5 MG) BY MOUTH DAILY AS NEEDED FOR SWELLING, Disp: 90 tablet, Rfl: 1    ibuprofen (MOTRIN) 600 MG tablet, Take 1 tablet (600 mg total) by mouth every eight (8) hours as needed for pain., Disp: 60 tablet, Rfl: 2    montelukast (SINGULAIR) 10 mg tablet, Take 1 tablet (10 mg total) by mouth daily. TAKE 1 TABLET(10 MG) BY MOUTH DAILY, Disp: 30 tablet, Rfl: 6    naloxone (NARCAN) 4 mg nasal spray, One spray in either nostril once for known/suspected opioid overdose. May repeat every 2-3 minutes in alternating nostril til EMS arrives, Disp: 1 each, Rfl: 0    naproxen (NAPROSYN) 500 MG tablet, Take 1 tablet (500 mg total) by mouth in the morning and 1 tablet (500 mg total) in the evening. Take with meals., Disp: , Rfl:     nortriptyline (PAMELOR) 25 MG capsule, Take 1 capsule (25 mg total) by mouth nightly., Disp: 90 capsule, Rfl: 1    olopatadine (PATANOL) 0.1 % ophthalmic solution, Administer 1 drop to both eyes Two (2) times a day., Disp: 5 mL, Rfl: 1    omalizumab (XOLAIR) 150 mg/mL syringe, Inject the contents of 2 syringes (300 mg total) under the skin every twenty-eight (28)  days., Disp: 2 mL, Rfl: 12    oxyCODONE-acetaminophen (PERCOCET) 10-325 mg per tablet, Take 1 tablet by mouth every six (6) hours as needed for pain. Max 4 tabs/day. Fill on or after: 04/13/2021. Brand name only due to allergy., Disp: 120 tablet, Rfl: 0    oxyCODONE-acetaminophen (PERCOCET) 10-325 mg per tablet, Take 1 tablet by mouth every six (6) hours as needed for pain. Max 4 tabs/day. Fill on or after: 05/13/2021. Brand name only due to allergy., Disp: 120 tablet, Rfl: 0    [START ON 06/12/2021] oxyCODONE-acetaminophen (PERCOCET) 10-325 mg per tablet, Take 1 tablet by mouth every six (6) hours as needed for pain. Max 4 tabs/day. Fill on or after: 06/12/2021. Brand name only due to allergy., Disp: 120 tablet, Rfl: 0    pantoprazole (PROTONIX) 40 MG tablet, TAKE 1 TABLET(40 MG) BY MOUTH DAILY, Disp: 30 tablet, Rfl: 4    potassium chloride 20 MEQ CR tablet, Take 1 tablet (20 mEq total) by mouth daily., Disp: 90 tablet, Rfl: 2    predniSONE (DELTASONE) 10 MG tablet, Take 6 pills on day 1, 5 pills on day 2, 4 pills on day 3, 3 pills on day 4, 2 pills on day 5, and 1 pill on day 6, Disp: 21 tablet, Rfl: 0    PROCHAMBER Spcr, , Disp: , Rfl:     rizatriptan (MAXALT-MLT) 10 MG disintegrating tablet, Take 1 tablet by mouth at ONSET of migraine or aura. May repeat in 2 HOURS if needed. DO NOT EXCEED 2 doses/day, 2 days/week, 8 doses/month., Disp: 10 tablet, Rfl: 5    rosuvastatin (CRESTOR) 10 MG tablet, Take 1 tablet (10 mg total) by mouth nightly., Disp: 90 tablet, Rfl: 1    triamcinolone (KENALOG) 0.1 % cream, Apply 1 application topically two (2) times a day as needed. WHEN ALLERGIES FLARE-UP AND CAUSES RASH / ITCHING, Disp: 80 g, Rfl: 0    XHANCE 93 mcg/actuation AerB, 1 spray into each nostril two (2) times a day as needed., Disp: 16 mL, Rfl: 6    albuterol HFA 90 mcg/actuation inhaler, Inhale 2 puffs every four (4) hours as needed for wheezing., Disp: 54 g, Rfl: 0    EPINEPHrine (EPIPEN) 0.3 mg/0.3 mL injection, Inject 0.3 mL (0.3 mg total) into the muscle once for 1 dose., Disp: 2 each, Rfl: 0     Allergies   Buprenorphine hcl, Pregabalin, Adhesive tape-silicones, Doxycycline hyclate (bulk), Keflex [cephalexin], Opioids - morphine analogues, and Oxycodone-acetaminophen    Medical History     Past Medical History:   Diagnosis Date    Allergic conjunctivitis 11/23/2018    Anemia 1986    Arthritis     Breast mass     Chondromalacia of left knee     Chondromalacia of right knee     Depressive disorder     Fibrocystic breast     GERD (gastroesophageal reflux disease)     Hematuria     History of kidney stones     History of transfusion     Hyperlipidemia 1999    Hypothyroidism     Lumbar disc disease 1998    She was injured at work at the age of 11 and then had disk surgery 2 years later     Menopause ovarian failure     Menopause, premature     Migraine with aura     Neuromuscular disorder (CMS-HCC) 1999    Obesity (BMI 30-39.9) 11/23/2018    Ovarian cyst     Plantar  fasciitis Pseudotumor cerebri     Rash due to allergy 11/23/2018    Sickle cell trait (CMS-HCC)     Urinary tract infection     Vaginitis        Surgical History     Past Surgical History:   Procedure Laterality Date    BACK SURGERY  2001    BREAST BIOPSY Bilateral     When patient was 16 & 17 Both Negative    HYSTERECTOMY  2008    PR REMOVAL OF TONSILS,12+ Y/O Bilateral 10/10/2019    Procedure: TONSILLECTOMY;  Surgeon: Lona Millard, MD;  Location: OR [place];  Service: ENT    SALPINGOOPHORECTOMY Bilateral 2007    SPINE SURGERY  2001    TOTAL VAGINAL HYSTERECTOMY  01/04/2006    Uterine Prolapse-Dr. Leeanne Rio OP Note    TUBAL LIGATION  1995        Social History     Past Medical History:   Diagnosis Date    Allergic conjunctivitis 11/23/2018    Anemia 1986    Arthritis     Breast mass     Chondromalacia of left knee     Chondromalacia of right knee     Depressive disorder     Fibrocystic breast     GERD (gastroesophageal reflux disease)     Hematuria     History of kidney stones     History of transfusion     Hyperlipidemia 1999    Hypothyroidism     Lumbar disc disease 1998    She was injured at work at the age of 63 and then had disk surgery 2 years later     Menopause ovarian failure     Menopause, premature     Migraine with aura     Neuromuscular disorder (CMS-HCC) 1999    Obesity (BMI 30-39.9) 11/23/2018    Ovarian cyst     Plantar fasciitis     Pseudotumor cerebri     Rash due to allergy 11/23/2018    Sickle cell trait (CMS-HCC)     Urinary tract infection     Vaginitis         Family History     Family History   Problem Relation Age of Onset    Heart attack Father     Mental illness Father     Asthma Father     Hypertension Mother     Heart disease Mother     Arthritis Mother     Depression Mother     Drug abuse Mother     Mental illness Mother     Ulcers Mother     COPD Mother     Heart attack Maternal Grandmother     Heart disease Maternal Grandmother     Hypertension Maternal Grandmother     Thyroid disease Maternal Grandmother     Breast cancer Cousin 83    Cancer Sister         lymphoma    COPD Maternal Aunt     Depression Son     Drug abuse Son     Mental illness Son     Stroke Paternal Grandfather     Thyroid disease Other     Alcohol abuse Neg Hx      Family Status   Relation Name Status    Father DJ Deceased    Mother Annice Pih Alive    [employer]/textile technologist (Not Specified)    Cousin  Deceased    Sister Marvis Repress (  Not Specified)    Mat Aunt Amada Jupiter Deceased    Son Cloretta Ned (Not Specified)    PGF HV (Not Specified)    Other  (Not Specified)    Neg Hx  (Not Specified)       Physical Exam          05/28/21 1332   BP: 123/86   Pulse: 79   Weight: 73 kg (161 lb)   Height: 154.9 cm (5' 1)        Appearance: Well nourished/well developed.  Psychiatric: Mood and affect appropriate.    Head:  Normocephalic, atraumatic.   Skin:  Clean, dry, no lesions, no rashes.  Neuro:  Alert and oriented.  Respiratory: Unlabored.       FOCUSED EXAM:  Knee Exam:  ROM          Extension: 0          Flexion: 130  Effusion:           None   Palpation            Tenderness medial joint line, lateral patellar facet  Ligament Testing:          Lachman's: Negative           Posterior Drawer: Negative           Collateral ligamentous instability: Negative   Meniscus Testing:          McMurray's: Positive     Patella Testing:          Patellar crepitus: Mild   Pain with patella grind          Patellar Tracking: Normal           Patellar Mobility: Normal     Hip:           Normal   Other Findings: Normal gait today.                                  Imaging   No results found.      Assessment & Plan     Diagnosis:   1. Chronic pain of left knee          Prescription:      Your Medication List            Accurate as of May 28, 2021  4:04 PM. If you have any questions, ask your nurse or doctor.                CONTINUE taking these medications      albuterol 90 mcg/actuation inhaler  Commonly known as: PROVENTIL HFA;VENTOLIN HFA  Inhale 2 puffs every four (4) hours as needed for wheezing.     azelastine 137 mcg (0.1 %) nasal spray  Commonly known as: ASTELIN  2 sprays into each nostril Two (2) times a day.     azelastine 0.05 % ophthalmic solution  Commonly known as: OPTIVAR  Administer 2 drops to both eyes daily as needed.     cetirizine 10 MG tablet  Commonly known as: ZyrTEC     diclofenac sodium 20 mg/gram /actuation(2 %) Sopm  Commonly known as: PENNSAID  Apply 2 pumps to affected area up to four times a day.     EPINEPHrine 0.3 mg/0.3 mL injection  Commonly known as: EPIPEN  Inject 0.3 mL (0.3 mg total) into the muscle once for 1 dose.     estradioL  0.5 MG tablet  Commonly known as: ESTRACE  TAKE 1 TABLET(0.5 MG) BY MOUTH DAILY     gabapentin 800 MG tablet  Commonly known as: NEURONTIN  Take 1 tablet (800 mg total) by mouth Two (2) times a day.     hydroCHLOROthiazide 12.5 MG tablet  Commonly known as: HYDRODIURIL  TAKE 1 TABLET(12.5 MG) BY MOUTH DAILY AS NEEDED FOR SWELLING     ibuprofen 600 MG tablet  Commonly known as: MOTRIN  Take 1 tablet (600 mg total) by mouth every eight (8) hours as needed for pain.     montelukast 10 mg tablet  Commonly known as: SINGULAIR  Take 1 tablet (10 mg total) by mouth daily. TAKE 1 TABLET(10 MG) BY MOUTH DAILY     naloxone 4 mg/actuation nasal spray  Commonly known as: NARCAN  One spray in either nostril once for known/suspected opioid overdose. May repeat every 2-3 minutes in alternating nostril til EMS arrives     naproxen 500 MG tablet  Commonly known as: NAPROSYN  Take 1 tablet (500 mg total) by mouth in the morning and 1 tablet (500 mg total) in the evening. Take with meals.     nortriptyline 25 MG capsule  Commonly known as: PAMELOR  Take 1 capsule (25 mg total) by mouth nightly.     olopatadine 0.1 % ophthalmic solution  Commonly known as: PatanoL  Administer 1 drop to both eyes Two (2) times a day.     oxyCODONE-acetaminophen 10-325 mg per tablet  Commonly known as: PERCOCET  Take 1 tablet by mouth every six (6) hours as needed for pain. Max 4 tabs/day. Fill on or after: 04/13/2021. Brand name only due to allergy.     oxyCODONE-acetaminophen 10-325 mg per tablet  Commonly known as: PERCOCET  Take 1 tablet by mouth every six (6) hours as needed for pain. Max 4 tabs/day. Fill on or after: 05/13/2021. Brand name only due to allergy.     oxyCODONE-acetaminophen 10-325 mg per tablet  Commonly known as: PERCOCET  Take 1 tablet by mouth every six (6) hours as needed for pain. Max 4 tabs/day. Fill on or after: 06/12/2021. Brand name only due to allergy.  Start taking on: Jun 12, 2021     pantoprazole 40 MG tablet  Commonly known as: PROTONIX  TAKE 1 TABLET(40 MG) BY MOUTH DAILY     potassium chloride 20 MEQ ER tablet  Take 1 tablet (20 mEq total) by mouth daily.     predniSONE 10 MG tablet  Commonly known as: DELTASONE  Take 6 pills on day 1, 5 pills on day 2, 4 pills on day 3, 3 pills on day 4, 2 pills on day 5, and 1 pill on day 6     PROCHAMBER Spcr  Generic drug: inhalational spacing device     rizatriptan 10 MG disintegrating tablet  Commonly known as: MAXALT-MLT  Take 1 tablet by mouth at ONSET of migraine or aura. May repeat in 2 HOURS if needed. DO NOT EXCEED 2 doses/day, 2 days/week, 8 doses/month.     rosuvastatin 10 MG tablet  Commonly known as: CRESTOR  Take 1 tablet (10 mg total) by mouth nightly.     triamcinolone 0.1 % cream  Commonly known as: KENALOG  Apply 1 application topically two (2) times a day as needed. WHEN ALLERGIES FLARE-UP AND CAUSES RASH / ITCHING     XHANCE 93 mcg/actuation Aerb  Generic drug: fluticasone propionate  1 spray into each nostril two (  2) times a day as needed.     XOLAIR 150 mg/mL syringe  Generic drug: omalizumab  Inject the contents of 2 syringes (300 mg total) under the skin every twenty-eight (28) days.              Plan: We reviewed the findings today.  The patient continues to have chronic pain in her left knee.  She only has mild arthritis on her x-rays.  This has been going on for a number of years. Given the overall continued symptoms and lack of resolution, we will schedule her for an MRI at this point to evaluate her knee further for possible meniscal injury in addition to the degeneration we are seeing of her articular cartilage.  In the meantime because of her pain level, we proceeded with a intra-articular corticosteroid injection and we will see her back after the MRI.      Lg Joint Inj: L knee on 05/28/2021 1:45 PM  Indications: pain  Details: 22 G needle  Laterality: left  Location: knee  Medications: 20 mg triamcinolone acetonide 10 mg/mL; 5 mL BUPivacaine (PF) 0.5 % (5 mg/mL)  Procedure, treatment alternatives, risks and benefits explained, specific risks discussed. Consent was given by the patient. Immediately prior to procedure a time out was called to verify the correct patient, procedure, equipment, support staff and site/side marked as required. Patient was prepped and draped in the usual sterile fashion.     Provider Attestation: The information documented by members of my medical care team was reviewed and verified for accuracy by me.        No follow-ups on file.    Lorraine Lax, MD  Date: 05/28/2021  Time: 4:04 PM

## 2021-05-28 NOTE — Unmapped (Addendum)
Trigger Point Injection of 3+ muscle groups: Bilateral cervical, thoracic and lumbar paraspinals, bilateral trapezius, rhomboids, and levator scapulae, bilateral quadratus and gluteus medius  Pre-operative diagnosis: Myofascial pain  Post-operative diagnosis: Same  Attending Physician: Dr. Criss Rosales  Assistant: Dr. Manson Passey    Brief HPI:  The patient is a 51 y.o. female with past medical history of pseudotumor cerebri, bilateral knee osteoarthritis, myofascial pain syndrome, L5-S1 spinal fusion in 2001, coccydynia. She presents today for treatment of her myofascial paracervical, shoulders, upper mid and lower back areas with repeat trigger point injections, last done in March 2023 with good benefit.     5/5 - Last 3/30, which were helpful.  No new complaints or concerns today.       Serial consent signed on 01/06/21, due for update at next visit     After risks and benefits were explained including bleeding, infection, worsening of the pain, damage to the area being injected, weakness, allergic reaction to medications, vascular injection, pneumothorax and nerve damage, signed consent was obtained.  All questions were answered.   The patient is not taking antiplatelet or anticoagulation medications and does not have a driver today.    The area of the trigger point was identified and the skin prepped with chlorhexidine.  Next, a 25 gauge 1.5 inch needle was placed in the area of the trigger points in the lumbar paraspinals and the gluteus medius; a 27 gauge 0.5 inch needle was used for everything else above.  Dry needling was performed in the area of the trigger point.  Once reproduction of the pain was elicited and negative aspiration confirmed, the trigger point was injected with 0.5-1 mL of 0.25% bupivacaine and the needle removed.     The patient did tolerate the procedure well and there were not complications.      Trigger points injected: 20    Trigger point locations:  bilateral cervical, thoracic and lumbar paraspinals, bilateral trapezius, rhomboids, and levator scapulae, bilateral quadratus, and gluteus medius    The patient was monitored for 15 minutes after the procedure.  Vital signs remained normal and the patient ambulated out of clinic    Pre-procedure pain score: 6/10  Post-procedure pain score: 2/10    DISPO:    - Repeat TPI ordered for 6-8 weeks      Orders Placed This Encounter   Procedures    Trigger Point Injs 3+ Muscles (57846)     TPIs, Dr Oda Kilts, 30 mins, quad or spine center, no PIV, no driver  No anticoag  6-8 weeks     Standing Status:   Future     Standing Expiration Date:   05/30/2022     Back Pain Questionnaire (Submitted on 05/26/2021)  Chief Complaint: Back pain  Chronicity: chronic  Onset: more than 1 year ago  Frequency: constantly  Progression since onset: unchanged  Pain location: gluteal, lumbar spine, sacro-iliac, thoracic spine  Pain quality: aching, burning, cramping, shooting, stabbing  Radiates to: left foot, left knee, left thigh, right foot, right knee  Pain - numeric: 9/10  Pain is: the same all the time  Aggravated by: bending, coughing, position, lying down, sitting, standing, twisting  Stiffness is present: all day  abdominal pain: No  bladder incontinence: No  bowel incontinence: No  chest pain: No  dysuria: No  fever: No  headaches: Yes  leg pain: Yes  numbness: Yes  paresis: No  paresthesias: Yes  pelvic pain: No  perianal numbness: No  tingling: Yes  weakness:  No  weight loss: No  Risk factors: menopause

## 2021-05-28 NOTE — Unmapped (Signed)
Pre-procedure call made and RN spoke with patient.    Denies recent infection/antibiotics.   Denies being diabetic.  Denies taking any recent blood thinners/NSAIDs.   Denies any electronic implants  Denies pregnancy    Driver necessary - no    Patient informed to arrive 30 minutes before procedure appointment time.  Patient verbalized understanding to all.

## 2021-05-29 ENCOUNTER — Ambulatory Visit: Admit: 2021-05-29 | Discharge: 2021-05-30 | Payer: MEDICARE | Attending: Anesthesiology | Primary: Anesthesiology

## 2021-05-29 DIAGNOSIS — M7918 Myalgia, other site: Principal | ICD-10-CM

## 2021-05-29 MED ADMIN — BUPivacaine HCl (MARCAINE) 0.25 % (2.5 mg/mL) injection 50 mg: 20 mL | @ 15:00:00 | Stop: 2021-05-29

## 2021-05-29 NOTE — Unmapped (Signed)
AVS reviewed with patient and she verbalized understanding to all.

## 2021-05-29 NOTE — Unmapped (Addendum)
POST PROCEDURE INSTRUCTIONS   You may apply an ice pack 20-30 minutes at a time to the injection site if you experience soreness.     Keep the injection site clean and dry. You make remove the band-aid one day following the procedure.     You may take a shower but AVOID getting in to baths, pools or whirlpools for 48 HOURS AFTER THE PROCEDURE.       ACTIVITY   Refrain from heavy activity for the next 24 to 48 hours. General walking is okay. You may resume your normal activities the day following the procedure.   You may start or resume your individualized exercise program or physical therapy 48 hours after the procedure.   MEDICATIONS   Please note that it is okay to continue other prescribed medications (blood pressure, insulin, water pill, depression/anxiety pill, etc.) as well as other prescribed pain medications such as Neurontin, Lyrical, Celebrex, Ultram, Vicodin, Norco and acetaminophen (Tylenol).   SIDE EFFECTS   Increase in pain during the first 24 to 48 hours.     You might experience:   1. Mild to moderate swelling at the joint.   2. Possible bruising at the injection site.     WHEN TO CALL THE DOCTOR/NURSE   Severe pain, worse or different that the pain you had before the procedure.     Fever or chills.     Redness, or swelling around the injection site.     Call the Pain Management  Procedural nurses [phone],  during normal business hours (8:00 am - 4:30 pm). If it is AFTER HOURS or during a weekend or holiday, call the hospital operator and ask for the Anesthesia Pain physician on call at [phone].   FOR EMERGENCIES, CALL 911 OR GO TO THE NEAREST HOSPITAL EMERGENCY DEPARTMENT.   ?   TO SCHEDULE APPOINTMENTS OR FOR QUESTIONS RELATED TO MEDICATIONS   Call the Pain Management Clinic at [phone]

## 2021-05-29 NOTE — Unmapped (Signed)
[facility] Administered Medication Refill Coordination Note      NAME:Natalie [name] DOB: December 27, 1970      Medication: Xolair    Day Supply: 28 days      SHIPPING      Next delivery from [facility] [phone]) to [name] is scheduled for 05/18.    Clinic contact: [name]    Patient's next nurse visit for administration: unsure.    We will follow up with clinic monthly for standard refill processing and delivery.      Chanel Mcadams Samella Parr  Specialty Pharmacy Technician

## 2021-05-31 NOTE — Unmapped (Signed)
Addended by: Carolynne Edouard on: 05/31/2021 05:49 AM     Modules accepted: Orders

## 2021-06-01 MED ORDER — DICLOFENAC 20 MG/GRAM/ACTUATION (2 %) TOPICAL SOLN METERED-DOSE PUMP
2 refills | 0 days | Status: CP
Start: 2021-06-01 — End: ?

## 2021-06-02 DIAGNOSIS — M7918 Myalgia, other site: Principal | ICD-10-CM

## 2021-06-02 DIAGNOSIS — J3089 Other allergic rhinitis: Principal | ICD-10-CM

## 2021-06-02 DIAGNOSIS — E876 Hypokalemia: Principal | ICD-10-CM

## 2021-06-02 DIAGNOSIS — M5417 Radiculopathy, lumbosacral region: Principal | ICD-10-CM

## 2021-06-02 DIAGNOSIS — R600 Localized edema: Principal | ICD-10-CM

## 2021-06-02 MED ORDER — POTASSIUM CHLORIDE ER 20 MEQ TABLET,EXTENDED RELEASE
ORAL_TABLET | 5 refills | 0.00000 days | Status: CP
Start: 2021-06-02 — End: ?

## 2021-06-02 MED ORDER — MONTELUKAST 10 MG TABLET
ORAL_TABLET | 3 refills | 0 days | Status: CP
Start: 2021-06-02 — End: ?

## 2021-06-02 MED ORDER — HYDROCHLOROTHIAZIDE 12.5 MG TABLET
ORAL_TABLET | 1 refills | 0 days | Status: CP
Start: 2021-06-02 — End: ?

## 2021-06-02 MED ORDER — GABAPENTIN 800 MG TABLET
ORAL_TABLET | 2 refills | 0 days | Status: CP
Start: 2021-06-02 — End: ?

## 2021-06-02 NOTE — Unmapped (Signed)
Patient is requesting the following refill  Requested Prescriptions     Pending Prescriptions Disp Refills    montelukast (SINGULAIR) 10 mg tablet [Pharmacy Med Name: MONTELUKAST 10MG  TABLETS] 90 tablet 1     Sig: TAKE 1 TABLET(10 MG) BY MOUTH EVERY NIGHT    hydroCHLOROthiazide (HYDRODIURIL) 12.5 MG tablet [Pharmacy Med Name: HYDROCHLOROTHIAZIDE 12.5MG  TABLETS] 90 tablet 1     Sig: TAKE 1 TABLET(12.5 MG) BY MOUTH DAILY AS NEEDED FOR SWELLING    potassium chloride 20 mEq TbER [Pharmacy Med Name: POTASSIUM CHLORIDE ER TABLETS] 30 tablet 0     Sig: TAKE 1 TABLET BY MOUTH DAILY       Recent Visits  Date Type Provider Dept   05/05/21 Telemedicine Venkat Rama Dallas Breeding, MD [facility]   04/15/21 Telemedicine Venkat Rama Dallas Breeding, MD [place] [facility]   04/13/21 Office Visit Venkat Rama Dallas Breeding, MD [place] [facility]   04/01/21 Office Visit Venkat Rama Dallas Breeding, MD [place] [facility]   03/02/21 Office Visit Venkat Rama Dallas Breeding, MD [place] [facility]   02/12/21 Office Visit Venkat Rama Dallas Breeding, MD [place] [facility]   12/23/20 Office Visit Venkat Rama Dallas Breeding, MD [place] [facility]   09/25/20 Telemedicine Venkat Rama Dallas Breeding, MD [place] [facility]   08/26/20 Office Visit Kaddijatou Henriette Combs, PA [place] [facility]   07/01/20 Telemedicine Kaddijatou Henriette Combs, PA [place] [facility]   Showing recent visits within past 365 days with a meds authorizing provider and meeting all other requirements  Future Appointments  No visits were found meeting these conditions.  Showing future appointments within next 365 days with a meds authorizing provider and meeting all other requirements       Labs: Creatinine:   Creatinine, Serum (mg/dL)   Date Value   16/10/9602 0.80     Creatinine   Date Value   02/16/2021 0.63 mg/dL   54/09/8117 0.8 mg/dl    Potassium:   Potassium, Serum (mEq/L)   Date Value   12/19/2012 4.1     Potassium (mmol/L)   Date Value   02/16/2021 3.3 (L)   01/02/2015 3.9    Sodium:   Sodium (mmol/L)   Date Value   02/16/2021 143   01/02/2015 141    Vitals:   BP Readings from Last 3 Encounters:   05/29/21 139/84   05/28/21 123/86   05/12/21 121/82    and   Pulse Readings from Last 3 Encounters:   05/29/21 81   05/28/21 79   05/12/21 73

## 2021-06-08 ENCOUNTER — Ambulatory Visit: Admit: 2021-06-08 | Discharge: 2021-06-09 | Payer: MEDICARE | Attending: Family Medicine | Primary: Family Medicine

## 2021-06-08 DIAGNOSIS — R232 Flushing: Principal | ICD-10-CM

## 2021-06-08 DIAGNOSIS — E0590 Thyrotoxicosis, unspecified without thyrotoxic crisis or storm: Principal | ICD-10-CM

## 2021-06-08 NOTE — Unmapped (Unsigned)
Symptoms for about a month

## 2021-06-08 NOTE — Unmapped (Signed)
Name:  Natalie [name]  DOB: 09-Sep-1970  Today's Date: 06/08/2021  Age:  51 y.o.    Assessment and Plan:     There are no diagnoses linked to this encounter.    Medication adherence and barriers to the treatment plan have been addressed. Opportunities to optimize healthy behaviors have been discussed. Patient / caregiver voiced understanding.      HPI:      Natalie [name]  is here for   Chief Complaint   Patient presents with   ??? Hot Flashes   ??? Night Sweats   ??? cold      Hot flashes, sweating, followed by cool sensation, going on for the last month. It could be during daytime, or night.  She will be excessively sweating at night, which would wake her up from sleep. Almost daily basis.  Occasional vaginal dryness, and dysperunia.  No recent anxiety / stress.  Has had hysterectomy with ovaries removed in 2007, due to uterine prolapse.    She has been on estradiol since hysterectomy.    PHQ:       [PHQ-2 Score]       [PHQ-9 Total Score Depression Severity]    ASCVD risk:  The 10-year ASCVD risk score (Arnett DK, et al., 2019) is: 2.6%    Values used to calculate the score:      Age: 64 years      Sex: Female      Is Non-Hispanic African American: Yes      Diabetic: No      Tobacco smoker: No      Systolic Blood Pressure: 122 mmHg      Is BP treated: Yes      HDL Cholesterol: 45 mg/dL      Total Cholesterol: 134 mg/dL    Note: For patients with SBP <90 or >200, Total Cholesterol <130 or >320, HDL <20 or >100 which are outside of the allowable range, the calculator will use these upper or lower values to calculate the patient???s risk score.       Past Medical/Surgical History:     Past Medical History:   Diagnosis Date   ??? Allergic conjunctivitis 11/23/2018   ??? Anemia 1986   ??? Arthritis    ??? Breast mass    ??? Chondromalacia of left knee    ??? Chondromalacia of right knee    ??? Depressive disorder    ??? Fibrocystic breast    ??? GERD (gastroesophageal reflux disease)    ??? Hematuria    ??? History of kidney stones ??? History of transfusion    ??? Hyperlipidemia 1999   ??? Hypothyroidism    ??? Lumbar disc disease 1998    She was injured at work at the age of 43 and then had disk surgery 2 years later    ??? Menopause ovarian failure    ??? Menopause, premature    ??? Migraine with aura    ??? Neuromuscular disorder (CMS-HCC) 1999   ??? Obesity (BMI 30-39.9) 11/23/2018   ??? Ovarian cyst    ??? Plantar fasciitis    ??? Pseudotumor cerebri    ??? Rash due to allergy 11/23/2018   ??? Sickle cell trait (CMS-HCC)    ??? Urinary tract infection    ??? Vaginitis      Past Surgical History:   Procedure Laterality Date   ??? BACK SURGERY  2001   ??? BREAST BIOPSY Bilateral     When patient was 16 & 17 Both  Negative   ??? HYSTERECTOMY  2008   ??? PR REMOVAL OF TONSILS,13+ Y/O Bilateral 10/10/2019    Procedure: TONSILLECTOMY;  Surgeon: Lona Millard, MD;  Location: OR [place];  Service: ENT   ??? SALPINGOOPHORECTOMY Bilateral 2007   ??? SPINE SURGERY  2001   ??? TOTAL VAGINAL HYSTERECTOMY  01/04/2006    Uterine Prolapse-Dr. Leeanne Rio OP Note   ??? TUBAL LIGATION  1995       Family History:     Family History   Problem Relation Age of Onset   ??? Heart attack Father    ??? Mental illness Father    ??? Asthma Father    ??? Hypertension Mother    ??? Heart disease Mother    ??? Arthritis Mother    ??? Depression Mother    ??? Drug abuse Mother    ??? Mental illness Mother    ??? Ulcers Mother    ??? COPD Mother    ??? Heart attack Maternal Grandmother    ??? Heart disease Maternal Grandmother    ??? Hypertension Maternal Grandmother    ??? Thyroid disease Maternal Grandmother    ??? Breast cancer Cousin 32   ??? Cancer Sister         lymphoma   ??? COPD Maternal Aunt    ??? Depression Son    ??? Drug abuse Son    ??? Mental illness Son    ??? Stroke Paternal Grandfather    ??? Thyroid disease Other    ??? Alcohol abuse Neg Hx        Social History:     Social History     Socioeconomic History   ??? Marital status: Single     Spouse name: None   ??? Number of children: 2   ??? Years of education: 15   ??? Highest education level: None   Occupational History     Employer: NOT EMPLOYED   Tobacco Use   ??? Smoking status: Never     Passive exposure: Past   ??? Smokeless tobacco: Never   Vaping Use   ??? Vaping Use: Never used   Substance and Sexual Activity   ??? Alcohol use: Never   ??? Drug use: Never   ??? Sexual activity: Yes     Partners: Male     Birth control/protection: Post-menopausal, Surgical     Comment: steady partner for 4 yrs, as on 04/03/19.   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, forensic)     Country of Origin - [place]                      Social Determinants of Health     Food Insecurity: Geophysicist/field seismologist Present   ??? Worried About Programme researcher, broadcasting/film/video in the Last Year: Sometimes true   ??? Barista in the Last Year: Sometimes true       Allergies:     Buprenorphine hcl, Pregabalin, Adhesive tape-silicones, Doxycycline hyclate (bulk), Keflex [cephalexin], Opioids - morphine analogues,  and Oxycodone-acetaminophen    Current Medications:     Current Outpatient Medications   Medication Sig Dispense Refill   ??? albuterol HFA 90 mcg/actuation inhaler Inhale 2 puffs every four (4) hours as needed for wheezing. 54 g 0   ??? azelastine (ASTELIN) 137 mcg (0.1 %) nasal spray 2 sprays into each nostril Two (2) times a day. 30 mL 12   ??? azelastine (OPTIVAR) 0.05 % ophthalmic solution Administer 2 drops to both eyes daily as needed. 6 mL 5   ??? cetirizine (ZYRTEC) 10 MG tablet      ??? diclofenac sodium (PENNSAID) 20 mg/gram /actuation(2 %) sopm Apply 2 pumps to affected area up to four times a day. 112 g 2   ??? estradioL (ESTRACE) 0.5 MG tablet TAKE 1 TABLET(0.5 MG) BY MOUTH DAILY 90 tablet 0   ??? gabapentin (NEURONTIN) 800 MG tablet TAKE 1 TABLET(800 MG) BY MOUTH TWICE DAILY 60 tablet 2   ??? hydroCHLOROthiazide (HYDRODIURIL) 12.5 MG tablet TAKE 1 TABLET(12.5 MG) BY MOUTH DAILY AS NEEDED FOR SWELLING 90 tablet 1   ??? ibuprofen (MOTRIN) 600 MG tablet Take 1 tablet (600 mg total) by mouth every eight (8) hours as needed for pain. 60 tablet 2   ??? naproxen (NAPROSYN) 500 MG tablet Take 1 tablet (500 mg total) by mouth in the morning and 1 tablet (500 mg total) in the evening. Take with meals.     ??? nortriptyline (PAMELOR) 25 MG capsule Take 1 capsule (25 mg total) by mouth nightly. 90 capsule 1   ??? olopatadine (PATANOL) 0.1 % ophthalmic solution Administer 1 drop to both eyes Two (2) times a day. 5 mL 1   ??? oxyCODONE-acetaminophen (PERCOCET) 10-325 mg per tablet Take 1 tablet by mouth every six (6) hours as needed for pain. Max 4 tabs/day. Fill on or after: 05/13/2021. Brand name only due to allergy. 120 tablet 0   ??? pantoprazole (PROTONIX) 40 MG tablet TAKE 1 TABLET(40 MG) BY MOUTH DAILY 30 tablet 4   ??? potassium chloride 20 MEQ CR tablet Take 1 tablet (20 mEq total) by mouth daily. 90 tablet 2   ??? rizatriptan (MAXALT-MLT) 10 MG disintegrating tablet Take 1 tablet by mouth at ONSET of migraine or aura. May repeat in 2 HOURS if needed. DO NOT EXCEED 2 doses/day, 2 days/week, 8 doses/month. 10 tablet 5   ??? rosuvastatin (CRESTOR) 10 MG tablet Take 1 tablet (10 mg total) by mouth nightly. 90 tablet 1   ??? triamcinolone (KENALOG) 0.1 % cream Apply 1 application topically two (2) times a day as needed. WHEN ALLERGIES FLARE-UP AND CAUSES RASH / ITCHING 80 g 0   ??? XHANCE 93 mcg/actuation AerB 1 spray into each nostril two (2) times a day as needed. 16 mL 6   ??? EPINEPHrine (EPIPEN) 0.3 mg/0.3 mL injection Inject 0.3 mL (0.3 mg total) into the muscle once for 1 dose. 2 each 0   ??? montelukast (SINGULAIR) 10 mg tablet TAKE 1 TABLET(10 MG) BY MOUTH EVERY NIGHT (Patient not taking: Reported on 06/08/2021) 90 tablet 3   ??? naloxone (NARCAN) 4 mg nasal spray One spray in nightly. 90 tablet 1    triamcinolone (KENALOG) 0.1 % cream Apply 1 application topically two (2) times a day as needed. WHEN ALLERGIES FLARE-UP AND CAUSES RASH / ITCHING 80 g 0    XHANCE 93 mcg/actuation AerB 1 spray into each nostril two (2) times a day as needed. 16 mL 6    EPINEPHrine (EPIPEN) 0.3 mg/0.3  mL injection Inject 0.3 mL (0.3 mg total) into the muscle once for 1 dose. 2 each 0    naloxone (NARCAN) 4 mg nasal spray One spray in either nostril once for known/suspected opioid overdose. May repeat every 2-3 minutes in alternating nostril til EMS arrives 1 each 0    omalizumab (XOLAIR) 150 mg/mL syringe Inject the contents of 2 syringes (300 mg total) under the skin every twenty-eight (28) days. 2 mL 12    oxyCODONE-acetaminophen (PERCOCET) 10-325 mg per tablet Take 1 tablet by mouth every six (6) hours as needed for pain. Max 4 tabs/day. Fill on or after: 04/13/2021. Brand name only due to allergy. 120 tablet 0    [START ON 06/12/2021] oxyCODONE-acetaminophen (PERCOCET) 10-325 mg per tablet Take 1 tablet by mouth every six (6) hours as needed for pain. Max 4 tabs/day. Fill on or after: 06/12/2021. Brand name only due to allergy. 120 tablet 0    potassium chloride 20 mEq TbER TAKE 1 TABLET BY MOUTH DAILY 30 tablet 5    PROCHAMBER Spcr        No current facility-administered medications for this visit.       Health Maintenance:     Health Maintenance Summary w/Most Recent Date    -        Overdue - Zoster Vaccines (1 of 2) Overdue - never done      No completion history exists for this topic.              Ordered - Serum Creatinine Monitoring (Yearly) Ordered on 06/08/2021      02/16/2021  Creatinine component of Comprehensive Metabolic Panel    04/02/2020  Creatinine component of Basic Metabolic Panel    01/09/2020  Creatinine component of Comprehensive Metabolic Panel    10/10/2019  Creatinine component of Basic Metabolic Panel    04/03/2019  Creatinine component of Comprehensive Metabolic Panel    Only the first 5 history entries have been loaded, but more history exists.              Ordered - Potassium Monitoring (Yearly) Ordered on 06/08/2021      02/16/2021  Potassium component of Comprehensive Metabolic Panel    04/02/2020  Potassium component of Basic Metabolic Panel    01/09/2020  Potassium component of Comprehensive Metabolic Panel    10/10/2019  Potassium component of Basic Metabolic Panel    04/03/2019  Potassium component of Comprehensive Metabolic Panel    Only the first 5 history entries have been loaded, but more history exists.              Mammogram Start Age 50 (Yearly) Next due on 02/23/2022      02/23/2021  Mammo Digital Screening W Tomo Bilateral W CAD    08/28/2019  Mammo Digital Diagnostic Tomo Bilateral W Cad    08/27/2019  Mammo Digital Diagnostic Bilateral W CAD    08/31/2018  [facility] MAMMOGRAPHY    08/31/2018  Mammo Digital Screening Bilateral W CAD    Only the first 5 history entries have been loaded, but more history exists.              DTaP/Tdap/Td Vaccines (2 - Td or Tdap) Next due on 09/29/2026      09/28/2016  Imm Admin: TdaP              Ordered - Colon Cancer Screening (Colonoscopy - Every 10 Years) Ordered on 02/25/2021      02/24/2021  Colonoscopy  Hepatitis C Screen  Completed      11/21/2014  Hepatitis C Antibody    10/27/2010  Hepatitis C Antibody              Influenza Vaccine (Series Information) Completed      09/25/2020  Imm Admin: INFLUENZA INJ MDCK PF, QUAD,(FLUCELVAX)(72MO AND UP EGG FREE)    11/05/2019  Imm Admin: INFLUENZA INJ MDCK PF, QUAD,(FLUCELVAX)(72MO AND UP EGG FREE)    09/28/2018  Imm Admin: Influenza Virus Vaccine, unspecified formulation    09/21/2018  Imm Admin: Influenza Vaccine Quad (IIV4 PF)(Afluria)54mo-Adult    10/11/2017  Imm Admin: Influenza Virus Vaccine, unspecified formulation    Only the first 5 history entries have been loaded, but more history exists.              COVID-19 Vaccine (Series Information) Completed      01/08/2021  Imm Admin: COVID-19 completion history exists for this topic.                Immunizations:     Immunization History   Administered Date(s) Administered   ??? COVID-19 VAC,BIVALENT,MODERNA 12/24/2020, 01/08/2021   ??? COVID-19 VACC,MRNA,(PFIZER)(PF) 04/23/2019, 05/14/2019   ??? COVID-19 VACCINE,MRNA(MODERNA)(PF) 04/02/2020   ??? INFLUENZA INJ MDCK PF, QUAD,(FLUCELVAX)(72MO AND UP EGG FREE) 11/05/2019, 09/25/2020   ??? INFLUENZA TIV (TRI) 72MO+ W/ PRESERV (IM) 02/07/2012   ??? Influenza Vaccine Quad (IIV4 PF)(Afluria)35mo-Adult 09/21/2018   ??? Influenza Virus Vaccine, unspecified formulation 09/26/2014, 09/28/2015, 08/25/2016, 10/11/2017, 09/28/2018   ??? PPD Test 05/23/2017, 01/24/2018, 05/06/2021   ??? TdaP 09/28/2016       I have reviewed and (if needed) updated the patient's problem list, medications, allergies, past medical and surgical history, social and family history.    ROS:     Review of Systems    Vital Signs:     Wt Readings from Last 3 Encounters:   06/08/21 72 kg (158 lb 12.8 oz)   05/28/21 73 kg (161 lb)   05/25/21 73 kg (161 lb)     Temp Readings from Last 3 Encounters:   06/08/21 36.6 ??C (97.9 ??F)   05/29/21 36.2 ??C (97.2 ??F) (Skin)   04/23/21 36.3 ??C (97.3 ??F) (Skin)     BP Readings from Last 3 Encounters:   06/08/21 122/76   05/29/21 139/84   05/28/21 123/86     Pulse Readings from Last 3 Encounters:   06/08/21 82   05/29/21 81   05/28/21 79     Body mass index is 30 kg/m??.    Objective:      Physical Exam      POC Testing and Recent Labs:     Results for orders placed or performed in visit on 05/12/21   Serum Protein Electrophoresis   Result Value Ref Range    T Albumin 4.1 3.5 - 5.0 g/dL    Alpha-1 Globulin 0.3 0.2 - 0.5 g/dL    Alpha-2 Globulin 0.7 0.5 - 1.1 g/dL    Beta-1 Globulin 0.4 0.3 - 0.6 g/dL    Beta-2 Globulin 0.5 0.2 - 0.6 g/dL    Gammaglobulin 1.4 0.5 - 1.5 g/dL    SPE Interpretation      Total Protein 7.4 g/dL   Erythrocyte sedimentation rate (ESR)   Result Value Ref Range    Sed Rate 47 (H) 0 - 30 mm/h   C-reactive protein (CRP)   Result Value Ref Range    CRP <4.0 <=10.0 mg/L   Anti-Nuclear Antibody (ANA)   Result  Value Ref Range    Antinuclear Antibodies (ANA) Negative Negative   CBC w/ Differential   Result Value Ref Range    WBC 6.0 3.6 - 11.2 10*9/L    RBC 4.32 3.95 - 5.13 10*12/L    HGB 13.0 11.3 - 14.9 g/dL    HCT 16.1 09.6 - 04.5 %    MCV 91.2 77.6 - 95.7 fL    MCH 30.2 25.9 - 32.4 pg    MCHC 33.1 32.0 - 36.0 g/dL    RDW 40.9 81.1 - 91.4 %    MPV 10.3 6.8 - 10.7 fL    Platelet 229 150 - 450 10*9/L    Neutrophils % 58.4 %    Lymphocytes % 29.7 %    Monocytes % 7.5 %    Eosinophils % 3.1 %    Basophils % 1.3 %    Absolute Neutrophils 3.5 1.8 - 7.8 10*9/L    Absolute Lymphocytes 1.8 1.1 - 3.6 10*9/L    Absolute Monocytes 0.5 0.3 - 0.8 10*9/L    Absolute Eosinophils 0.2 0.0 - 0.5 10*9/L    Absolute Basophils 0.1 0.0 - 0.1 10*9/L     *Note: Due to a large number of results and/or encounters for the requested time period, some results have not been displayed. A complete set of results can be found in Results Review.       {Common Labs - NW:29562}    Bernita Raisin, MD  06/08/2021 0.5 0.3 - 0.8 10*9/L    Absolute Eosinophils 0.2 0.0 - 0.5 10*9/L    Absolute Basophils 0.1 0.0 - 0.1 10*9/L     *Note: Due to a large number of results and/or encounters for the requested time period, some results have not been displayed. A complete set of results can be found in Results Review.         Bernita Raisin, MD  06/09/2021

## 2021-06-10 MED FILL — XOLAIR 150 MG/ML SUBCUTANEOUS SYRINGE: SUBCUTANEOUS | 28 days supply | Qty: 2 | Fill #1

## 2021-06-11 ENCOUNTER — Ambulatory Visit: Admit: 2021-06-11 | Payer: MEDICARE | Attending: Sports Medicine | Primary: Sports Medicine

## 2021-06-12 MED ORDER — OXYCODONE-ACETAMINOPHEN 10 MG-325 MG TABLET
ORAL_TABLET | Freq: Four times a day (QID) | ORAL | 0 refills | 30 days | Status: CP | PRN
Start: 2021-06-12 — End: ?

## 2021-06-16 ENCOUNTER — Telehealth: Admit: 2021-06-16 | Discharge: 2021-06-17 | Payer: MEDICARE | Attending: Psychologist | Primary: Psychologist

## 2021-06-16 DIAGNOSIS — F419 Anxiety disorder, unspecified: Principal | ICD-10-CM

## 2021-06-16 DIAGNOSIS — F1190 Opioid use, unspecified, uncomplicated: Principal | ICD-10-CM

## 2021-06-16 NOTE — Unmapped (Signed)
[facility]   Confidential Psychological Therapy Session    Patient Name: Natalie [name]  Medical Record Number: [id number]  Date of Service: Jun 16, 2021  Attending Psychologist: Caroline More, PhD  CPT Procedure Code: 81191 for 45 minutes of face to face counseling    This visit was performed face to face with interactive technology using a HIPPA compliant audio/visual platform. We reviewed confidentiality today. The patient was present in [place], a state in which this provider is licensed and able to provide care (location and contact information confirmed), attended this visit alone, and consented to this virtual pain psychology visit.    REFERRING PHYSICIAN: Criss Rosales, MD    CHIEF COMPLAINT AND REASON FOR REFERRAL:  COM follow up evaluation/pain coping skills; CBT/ACT for pain    SUBJECTIVE / HISTORY OF PRESENT ILLNESS: [name] is a very pleasant 51 y.o.  female with chronic lower back pain, chronic headaches, and myofascial pain. Her pain started in 1999 in her lower back.  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  She had a spinal fusion in 2001 at the L5-S1 level.  She has had bilateral shoulder pain, neck pain, lower back pain, and b/l knees.  She also described a radiculopathy b/l that goes from her back to her bilateral buttocks and down to her toes.      Pt initially established with Dr. Oda Kilts in 08/2016 and then was was first evaluated by me in 07/2017. Last follow up with me was 03/25/21. Pt participates actively today. She is in good spirits and reports improved mood and sleep. She is working as an Product manager 4 days a week now and is feeling better physically and emotionally. She now falls asleep without any sleep medication. She is tired at the end of the day and just falls asleep easily. She feels like she is still tired but notes this is likely a reflection of her being so active, thus feeling tired is perceived as good versus bad. Pt notes she has talked to her class attendants about the value of pain psychology and adds I've given your name out a lot. Pt also notes that she has not lost weight but she has changed sizes down quite a bit and she feels stronger. She notes she has gained more muscle and she feels good about how her body is functioning and how it looks. Pt trying to cut back on her gabapentin the last few weeks - is hopeful she doesn't need it and that she might feel more energetic during the day. Pt reports good adherence to her pain contract. Agrees to continue using pacing and ARC with all of her activity.     Patient still taking opioid pain medication. Notes I still have breakthrough pain. Processed her pain experience and overall improvement.  Takes opioid pain medication, often breaking in half. Pt seems more confident when talking about all other medications, somewhat anxious when discussing opioids. Will work slowly - did set a goal of decreasing need for opioid pain medication slowly over time.    OBJECTIVE / MENTAL STATUS:    Appearance:   Appears stated age and Clean/Neat   Motor:  No abnormal movements   Speech/Language:   Normal rate, volume, tone, fluency   Mood:  Euthymic, improved   Affect:  Bright! Mood congruent   Thought process:  Logical, linear, clear, coherent, goal directed   Thought content:  Denies SI, HI, self harm, delusions, obsessions, paranoid ideation, or ideas of reference   Perceptual disturbances:    Denies auditory and visual hallucinations, behavior not concerning for response to internal stimuli   Orientation:  Oriented to person, place, time, and general circumstances   Attention:  Able to fully attend without fluctuations in consciousness   Concentration:  Able to fully concentrate and attend   Memory:  Immediate, short-term, long-term, and recall grossly intact    Fund of knowledge:   Consistent with level of education and development   Insight:    Fair   Judgment:   Intact   Impulse Control:  Intact       DIAGNOSTIC IMPRESSION:   Anxiety NOS  Chronic continuou use of opiates    ASSESSMENT:   [name] is a very pleasant 51 y.o.  female from [place], [place] with chronic lower back pain, chronic headaches, and myofascial pain. Her pain started in 1999 in her lower back.  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  She had a spinal fusion in 2001 at the L5-S1 level.  She has had bilateral shoulder pain, neck pain, lower back pain, and b/l knees.  She also described a radiculopathy b/l that goes from her back to her bilateral buttocks and down to her toes.  Pt initially met with Dr. Oda Kilts on 08/27/2016. Last follow up with Dr. Oda Kilts was on 07/14/2017 for Trigger Point Injection of 3+ muscle groups: cervical paraspinal, trapezius, rhomboids, levator scapulae, lumbar paraspinal, thoracic paraspinal, quadratus lumborum to address myofascial pain.    The patient is currently considered to be high risk due to prior nonadherence, but appropriate with a behavioral adherence plan in place.  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    PLAN:   (1) COM - high risk but remains appropriate with a behavioral adherence plan also in place.    -Patient MUST meet with pain psychology/me once a month.   -No additional overuse of opioids will be tolerated.    -Patient should always bring her medication to clinic for pill counts.  -Patient was informed she has had numerous infractions with respect to her pain contract over the years, and that no further infractions will be tolerated.    If any additional pain contract or behavioral adherence contract infractions occur, I recommend stopping opioids.    Pt doing well with adherence, per our last few visits.     (2) Pain coping skills- addressed compliance, as well as pacing and mindful breathing, body scan, and problem solving    (3) follow-up with me next month for a virtual visit

## 2021-06-17 DIAGNOSIS — N760 Acute vaginitis: Principal | ICD-10-CM

## 2021-06-17 MED ORDER — FLUCONAZOLE 150 MG TABLET
ORAL_TABLET | 0 refills | 0 days
Start: 2021-06-17 — End: ?

## 2021-06-17 NOTE — Unmapped (Signed)
Unable to complete refill request, medication is not included in the refill protocol. .   Please advise on how to proceed.  Patient is requesting the following refill  Requested Prescriptions     Pending Prescriptions Disp Refills    fluconazole (DIFLUCAN) 150 MG tablet [Pharmacy Med Name: FLUCONAZOLE 150MG  TABLETS] 2 tablet 0     Sig: TAKE 1 TABLET(150 MG) BY MOUTH 1 TIME FOR 1 DOSE       Recent Visits  Date Type Provider Dept   06/08/21 Office Visit Venkat Rama Dallas Breeding, MD [facility]   05/05/21 Telemedicine Venkat Rama Dallas Breeding, MD [facility]   04/15/21 Telemedicine Venkat Rama Dallas Breeding, MD [facility]   04/13/21 Office Visit Venkat Rama Dallas Breeding, MD [facility] [facility]   04/01/21 Office Visit Venkat Rama Dallas Breeding, MD [facility] [facility]   03/02/21 Office Visit Venkat Rama Dallas Breeding, MD [facility]   02/12/21 Office Visit Venkat Rama Dallas Breeding, MD [facility]   12/23/20 Office Visit Venkat Rama Dallas Breeding, MD [facility]   09/25/20 Telemedicine Venkat Rama Dallas Breeding, MD [place] [facility]   08/26/20 Office Visit Kaddijatou Henriette Combs, PA [facility] [facility]   Showing recent visits within past 365 days with a meds authorizing provider and meeting all other requirements  Future Appointments  Date Type Provider Dept   07/23/21 Appointment Venkat Rama Dallas Breeding, MD [place] [facility]   Showing future appointments within next 365 days with a meds authorizing provider and meeting all other requirements       Labs: Not applicable this refill

## 2021-06-18 MED ORDER — FLUCONAZOLE 150 MG TABLET
ORAL_TABLET | Freq: Once | ORAL | 0 refills | 1 days | Status: CP
Start: 2021-06-18 — End: 2021-06-18

## 2021-06-25 NOTE — Unmapped (Signed)
Abstraction Result Flowsheet Data    This patient's last AWV date: [facility] Last Medicare Wellness Visit Date: 06/30/2017  This patients last WCC/CPE date: : Not Found      Reason for Encounter  Reason for Encounter: Outreach  Primary Reason for Outreach: AWV  Outreach Call Outcome: No voicemail available

## 2021-06-29 NOTE — Unmapped (Unsigned)
PATIENT: Natalie [name]      AGE: 51 y.o.     DOB: 03/14/1970    Medical Record Number:  [id number]            Date of Exam:  06/29/2021      Primary Care Provider:  Bernita Raisin, MD       Chief Complaint     No chief complaint on file.       History Of Present Illness   Natalie [name] is a  51 y.o.  female who presents today for evaluation of left knee.  She has been complaining of pain in her knee for the past several years.  She has managed this with viscosupplementation as well as corticosteroid injections.  She underwent her last Visco supplementation nearly a year ago.  It was not quite as effective but she did get relief.  Her prior injections lasted about 2 years.  She complains of pain and stiffness in the knee.  The pain is achy and dull but can become sharp at times.  She is felt some discomfort going up and down stairs as well as pivoting.  She has used naproxen as well as Percocet at times to manage the symptoms.    Today's History Update 06/29/2021 - Patient presents for MRI results review of left knee. ***       Home Medications     Current Outpatient Medications:     albuterol HFA 90 mcg/actuation inhaler, Inhale 2 puffs every four (4) hours as needed for wheezing., Disp: 54 g, Rfl: 0    azelastine (ASTELIN) 137 mcg (0.1 %) nasal spray, 2 sprays into each nostril Two (2) times a day., Disp: 30 mL, Rfl: 12    azelastine (OPTIVAR) 0.05 % ophthalmic solution, Administer 2 drops to both eyes daily as needed., Disp: 6 mL, Rfl: 5    cetirizine (ZYRTEC) 10 MG tablet, , Disp: , Rfl:     diclofenac sodium (PENNSAID) 20 mg/gram /actuation(2 %) sopm, Apply 2 pumps to affected area up to four times a day., Disp: 112 g, Rfl: 2    EPINEPHrine (EPIPEN) 0.3 mg/0.3 mL injection, Inject 0.3 mL (0.3 mg total) into the muscle once for 1 dose., Disp: 2 each, Rfl: 0    estradioL (ESTRACE) 0.5 MG tablet, TAKE 1 TABLET(0.5 MG) BY MOUTH DAILY, Disp: 90 tablet, Rfl: 0    gabapentin (NEURONTIN) 800 MG tablet, TAKE 1 TABLET(800 MG) BY MOUTH TWICE DAILY, Disp: 60 tablet, Rfl: 2    hydroCHLOROthiazide (HYDRODIURIL) 12.5 MG tablet, TAKE 1 TABLET(12.5 MG) BY MOUTH DAILY AS NEEDED FOR SWELLING, Disp: 90 tablet, Rfl: 1    naloxone (NARCAN) 4 mg nasal spray, One spray in either nostril once for known/suspected opioid overdose. May repeat every 2-3 minutes in alternating nostril til EMS arrives, Disp: 1 each, Rfl: 0    naproxen (NAPROSYN) 500 MG tablet, Take 1 tablet (500 mg total) by mouth in the morning and 1 tablet (500 mg total) in the evening. Take with meals., Disp: , Rfl:     nortriptyline (PAMELOR) 25 MG capsule, Take 1 capsule (25 mg total) by mouth nightly., Disp: 90 capsule, Rfl: 1    olopatadine (PATANOL) 0.1 % ophthalmic solution, Administer 1 drop to both eyes Two (2) times a day., Disp: 5 mL, Rfl: 1    omalizumab (XOLAIR) 150 mg/mL syringe, Inject the contents of 2 syringes (300 mg total) under the skin every twenty-eight (28) days., Disp: 2 mL, Rfl: 12  oxyCODONE-acetaminophen (PERCOCET) 10-325 mg per tablet, Take 1 tablet by mouth every six (6) hours as needed for pain. Max 4 tabs/day. Fill on or after: 04/13/2021. Brand name only due to allergy., Disp: 120 tablet, Rfl: 0    oxyCODONE-acetaminophen (PERCOCET) 10-325 mg per tablet, Take 1 tablet by mouth every six (6) hours as needed for pain. Max 4 tabs/day. Fill on or after: 05/13/2021. Brand name only due to allergy., Disp: 120 tablet, Rfl: 0    oxyCODONE-acetaminophen (PERCOCET) 10-325 mg per tablet, Take 1 tablet by mouth every six (6) hours as needed for pain. Max 4 tabs/day. Fill on or after: 06/12/2021. Brand name only due to allergy., Disp: 120 tablet, Rfl: 0    pantoprazole (PROTONIX) 40 MG tablet, TAKE 1 TABLET(40 MG) BY MOUTH DAILY, Disp: 30 tablet, Rfl: 4    potassium chloride 20 MEQ CR tablet, Take 1 tablet (20 mEq total) by mouth daily., Disp: 90 tablet, Rfl: 2    potassium chloride 20 mEq TbER, TAKE 1 TABLET BY MOUTH DAILY, Disp: 30 tablet, Rfl: 5    PROCHAMBER Spcr, , Disp: , Rfl:     rizatriptan (MAXALT-MLT) 10 MG disintegrating tablet, Take 1 tablet by mouth at ONSET of migraine or aura. May repeat in 2 HOURS if needed. DO NOT EXCEED 2 doses/day, 2 days/week, 8 doses/month., Disp: 10 tablet, Rfl: 5    rosuvastatin (CRESTOR) 10 MG tablet, Take 1 tablet (10 mg total) by mouth nightly., Disp: 90 tablet, Rfl: 1    triamcinolone (KENALOG) 0.1 % cream, Apply 1 application topically two (2) times a day as needed. WHEN ALLERGIES FLARE-UP AND CAUSES RASH / ITCHING, Disp: 80 g, Rfl: 0    XHANCE 93 mcg/actuation AerB, 1 spray into each nostril two (2) times a day as needed., Disp: 16 mL, Rfl: 6     Allergies   Buprenorphine hcl, Pregabalin, Adhesive tape-silicones, Doxycycline hyclate (bulk), Keflex [cephalexin], Opioids - morphine analogues, and Oxycodone-acetaminophen    Medical History     Past Medical History:   Diagnosis Date    Allergic conjunctivitis 11/23/2018    Anemia 1986    Arthritis     Breast mass     Chondromalacia of left knee     Chondromalacia of right knee     Depressive disorder     Fibrocystic breast     GERD (gastroesophageal reflux disease)     Hematuria     History of kidney stones     History of transfusion     Hyperlipidemia 1999    Hypothyroidism     Lumbar disc disease 1998    She was injured at work at the age of 43 and then had disk surgery 2 years later     Menopause ovarian failure     Menopause, premature     Migraine with aura     Neuromuscular disorder (CMS-HCC) 1999    Obesity (BMI 30-39.9) 11/23/2018    Ovarian cyst     Plantar fasciitis     Pseudotumor cerebri     Rash due to allergy 11/23/2018    Sickle cell trait (CMS-HCC)     Urinary tract infection     Vaginitis        Surgical History     Past Surgical History:   Procedure Laterality Date    BACK SURGERY  2001    BREAST BIOPSY Bilateral     When patient was 16 & 17 Both Negative    HYSTERECTOMY  2008  PR REMOVAL OF TONSILS,13+ Y/O Bilateral 10/10/2019    Procedure: TONSILLECTOMY;  Surgeon: Lona Millard, MD;  Location: OR [place];  Service: ENT    SALPINGOOPHORECTOMY Bilateral 2007    SPINE SURGERY  2001    TOTAL VAGINAL HYSTERECTOMY  01/04/2006    Uterine Prolapse-Dr. Leeanne Rio OP Note    TUBAL LIGATION  1995        Social History     Past Medical History:   Diagnosis Date    Allergic conjunctivitis 11/23/2018    Anemia 1986    Arthritis     Breast mass     Chondromalacia of left knee     Chondromalacia of right knee     Depressive disorder     Fibrocystic breast     GERD (gastroesophageal reflux disease)     Hematuria     History of kidney stones     History of transfusion     Hyperlipidemia 1999    Hypothyroidism     Lumbar disc disease 1998    She was injured at work at the age of 30 and then had disk surgery 2 years later     Menopause ovarian failure     Menopause, premature     Migraine with aura     Neuromuscular disorder (CMS-HCC) 1999    Obesity (BMI 30-39.9) 11/23/2018    Ovarian cyst     Plantar fasciitis     Pseudotumor cerebri     Rash due to allergy 11/23/2018    Sickle cell trait (CMS-HCC)     Urinary tract infection     Vaginitis         Family History     Family History   Problem Relation Age of Onset    Heart attack Father     Mental illness Father     Asthma Father     Hypertension Mother     Heart disease Mother     Arthritis Mother     Depression Mother     Drug abuse Mother     Mental illness Mother     Ulcers Mother     COPD Mother     Heart attack Maternal Grandmother     Heart disease Maternal Grandmother     Hypertension Maternal Grandmother     Thyroid disease Maternal Grandmother     Breast cancer Cousin 17    Cancer Sister         lymphoma    COPD Maternal Aunt     Depression Son     Drug abuse Son     Mental illness Son     Stroke Paternal Grandfather     Thyroid disease Other     Alcohol abuse Neg Hx      Family Status   Relation Name Status    Father DJ Deceased    Mother Particia Nearing    [employer]/textile technologist (Not Specified)    Cousin  Deceased    Sister Marvis Repress (Not Specified)    Mat Aunt Amada Jupiter Deceased    Son Cloretta Ned (Not Specified)    PGF HV (Not Specified)    Other  (Not Specified)    Neg Hx  (Not Specified)       Physical Exam     There were no vitals filed for this visit.       Appearance: Well nourished/well developed.  Psychiatric: Mood and affect appropriate.    Head:  Normocephalic, atraumatic.   Skin:  Clean,  dry, no lesions, no rashes.  Neuro:  Alert and oriented.  Respiratory: Unlabored.       FOCUSED EXAM:  Knee Exam:  ROM          Extension: 0          Flexion: 130  Effusion:           None   Palpation            Tenderness medial joint line, lateral patellar facet  Ligament Testing:          Lachman's: Negative           Posterior Drawer: Negative           Collateral ligamentous instability: Negative   Meniscus Testing:          McMurray's: Positive     Patella Testing:          Patellar crepitus: Mild   Pain with patella grind          Patellar Tracking: Normal           Patellar Mobility: Normal     Hip:           Normal   Other Findings: Normal gait today.                                  Imaging   No results found.      Assessment & Plan     Diagnosis:   No diagnosis found.        Prescription:      Your Medication List            Accurate as of June 29, 2021  9:14 AM. If you have any questions, ask your nurse or doctor.                CONTINUE taking these medications      albuterol 90 mcg/actuation inhaler  Commonly known as: PROVENTIL HFA;VENTOLIN HFA  Inhale 2 puffs every four (4) hours as needed for wheezing.     azelastine 137 mcg (0.1 %) nasal spray  Commonly known as: ASTELIN  2 sprays into each nostril Two (2) times a day.     azelastine 0.05 % ophthalmic solution  Commonly known as: OPTIVAR  Administer 2 drops to both eyes daily as needed.     cetirizine 10 MG tablet  Commonly known as: ZyrTEC     diclofenac sodium 20 mg/gram /actuation(2 %) Sopm  Commonly known as: PENNSAID  Apply 2 pumps to affected area up to four times a day.     EPINEPHrine 0.3 mg/0.3 mL injection  Commonly known as: EPIPEN  Inject 0.3 mL (0.3 mg total) into the muscle once for 1 dose.     estradioL 0.5 MG tablet  Commonly known as: ESTRACE  TAKE 1 TABLET(0.5 MG) BY MOUTH DAILY     gabapentin 800 MG tablet  Commonly known as: NEURONTIN  TAKE 1 TABLET(800 MG) BY MOUTH TWICE DAILY     hydroCHLOROthiazide 12.5 MG tablet  Commonly known as: HYDRODIURIL  TAKE 1 TABLET(12.5 MG) BY MOUTH DAILY AS NEEDED FOR SWELLING     naloxone 4 mg/actuation nasal spray  Commonly known as: NARCAN  One spray in either nostril once for known/suspected opioid overdose. May repeat every 2-3 minutes in alternating nostril til EMS arrives     naproxen 500 MG tablet  Commonly known as: NAPROSYN  Take 1 tablet (500 mg total) by mouth in the morning and 1 tablet (500 mg total) in the evening. Take with meals.     nortriptyline 25 MG capsule  Commonly known as: PAMELOR  Take 1 capsule (25 mg total) by mouth nightly.     olopatadine 0.1 % ophthalmic solution  Commonly known as: PatanoL  Administer 1 drop to both eyes Two (2) times a day.     oxyCODONE-acetaminophen 10-325 mg per tablet  Commonly known as: PERCOCET  Take 1 tablet by mouth every six (6) hours as needed for pain. Max 4 tabs/day. Fill on or after: 04/13/2021. Brand name only due to allergy.     oxyCODONE-acetaminophen 10-325 mg per tablet  Commonly known as: PERCOCET  Take 1 tablet by mouth every six (6) hours as needed for pain. Max 4 tabs/day. Fill on or after: 05/13/2021. Brand name only due to allergy.     oxyCODONE-acetaminophen 10-325 mg per tablet  Commonly known as: PERCOCET  Take 1 tablet by mouth every six (6) hours as needed for pain. Max 4 tabs/day. Fill on or after: 06/12/2021. Brand name only due to allergy.     pantoprazole 40 MG tablet  Commonly known as: PROTONIX  TAKE 1 TABLET(40 MG) BY MOUTH DAILY     potassium chloride 20 MEQ ER tablet  Take 1 tablet (20 mEq total) by mouth daily. potassium chloride 20 mEq Tber  TAKE 1 TABLET BY MOUTH DAILY     PROCHAMBER Spcr  Generic drug: inhalational spacing device     rizatriptan 10 MG disintegrating tablet  Commonly known as: MAXALT-MLT  Take 1 tablet by mouth at ONSET of migraine or aura. May repeat in 2 HOURS if needed. DO NOT EXCEED 2 doses/day, 2 days/week, 8 doses/month.     rosuvastatin 10 MG tablet  Commonly known as: CRESTOR  Take 1 tablet (10 mg total) by mouth nightly.     triamcinolone 0.1 % cream  Commonly known as: KENALOG  Apply 1 application topically two (2) times a day as needed. WHEN ALLERGIES FLARE-UP AND CAUSES RASH / ITCHING     XHANCE 93 mcg/actuation Aerb  Generic drug: fluticasone propionate  1 spray into each nostril two (2) times a day as needed.     XOLAIR 150 mg/mL syringe  Generic drug: omalizumab  Inject the contents of 2 syringes (300 mg total) under the skin every twenty-eight (28) days.              Plan: We reviewed the findings today.  The patient continues to have chronic pain in her left knee.  She only has mild arthritis on her x-rays.  This has been going on for a number of years.  Given the overall continued symptoms and lack of resolution, we will schedule her for an MRI at this point to evaluate her knee further for possible meniscal injury in addition to the degeneration we are seeing of her articular cartilage.  In the meantime because of her pain level, we proceeded with a intra-articular corticosteroid injection and we will see her back after the MRI.      Procedures      No follow-ups on file.    Reed Pandy Jadon Ressler, CMA  Date: 06/29/2021  Time: 9:14 AM

## 2021-07-02 ENCOUNTER — Ambulatory Visit: Admit: 2021-07-02 | Discharge: 2021-07-03 | Payer: MEDICARE | Attending: Sports Medicine | Primary: Sports Medicine

## 2021-07-02 DIAGNOSIS — M1712 Unilateral primary osteoarthritis, left knee: Principal | ICD-10-CM

## 2021-07-02 NOTE — Unmapped (Signed)
Please order a round of viscosupplementation with Lanora Manis.
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
## 2021-07-03 NOTE — Unmapped (Signed)
Left knee

## 2021-07-03 NOTE — Unmapped (Signed)
[facility] Administered Medication Refill Coordination Note      NAME:Natalie [name] DOB: 1970-10-12      Medication: Xolair    Day Supply: 28 days      SHIPPING      Next delivery from [facility] [phone]) to [facility] for Natalie [name] is scheduled for 06/14.    Clinic contact: [name]    Patient's next nurse visit for administration: n/a.    We will follow up with clinic monthly for standard refill processing and delivery.      Rosalee Tolley Samella Parr  Specialty Pharmacy Technician
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
## 2021-07-06 ENCOUNTER — Ambulatory Visit: Admit: 2021-07-06 | Discharge: 2021-07-07 | Payer: MEDICARE

## 2021-07-06 DIAGNOSIS — F1190 Opioid use, unspecified, uncomplicated: Principal | ICD-10-CM

## 2021-07-06 DIAGNOSIS — M5417 Radiculopathy, lumbosacral region: Principal | ICD-10-CM

## 2021-07-06 DIAGNOSIS — M7918 Myalgia, other site: Principal | ICD-10-CM

## 2021-07-06 DIAGNOSIS — G894 Chronic pain syndrome: Principal | ICD-10-CM

## 2021-07-06 MED ORDER — BACLOFEN 20 MG TABLET
ORAL_TABLET | 2 refills | 0 days | Status: CP
Start: 2021-07-06 — End: ?

## 2021-07-06 NOTE — Unmapped (Addendum)
It was nice to see you.  Today we did the following:    1.  Refill oxycodone. Refill dates: 07/10/21, 08/09/21 and 09/08/21.    2. Continue baclofen, gabapentin, Voltaren Gel and nortriptyline.    3. Follow up in 3 months.    Thank you for visiting the [facility].      -Remember to bring all your opioid(narcotic) pill bottles/boxes to every clinic visit, in their original containers.      -Because of the high volume of calls we receive and the high demand for our clinical services, we are occupied all day providing care for patients in the clinic. This leaves little time to respond to phone calls, and we are generally unable to discuss patient care advice over the telephone. If you are experiencing a medication side effect or complication, you can call and let us know, but we will typically not make a medication substitution or change over the telephone.      We are generally unable to respond acutely to a flare up of pain, as this is quite common in our patients and needs to be dealt with as part of the long term management plan. Please make an appointment with [place] if you wish to discuss a matter in any detail. Should you still need to call, please do so at [phone].      Please do not call for early medication refills.      For additional information and services provided by our clinic you may visit our Pain Management Clinic website at:   [email or web address]      Thank you for choosing [facility]. It was a pleasure to see you in clinic today. Please contact us with any questions or concerns at [phone].       Thank you,   Al Corpus, PharmD, CPP
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
## 2021-07-08 MED FILL — XOLAIR 150 MG/ML SUBCUTANEOUS SYRINGE: SUBCUTANEOUS | 28 days supply | Qty: 2 | Fill #2

## 2021-07-10 ENCOUNTER — Institutional Professional Consult (permissible substitution): Admit: 2021-07-10 | Discharge: 2021-07-11 | Payer: MEDICARE

## 2021-07-10 DIAGNOSIS — M1712 Unilateral primary osteoarthritis, left knee: Principal | ICD-10-CM

## 2021-07-10 MED ORDER — OXYCODONE-ACETAMINOPHEN 10 MG-325 MG TABLET
ORAL_TABLET | Freq: Four times a day (QID) | ORAL | 0 refills | 30 days | Status: CP | PRN
Start: 2021-07-10 — End: ?

## 2021-07-10 MED ADMIN — sodium hyaluronate (viscosup) (GELSYN-3) 16.8 mg/2 mL injection 16.8 mg: 16.8 mg | INTRA_ARTICULAR | @ 14:00:00 | Stop: 2021-07-10

## 2021-07-10 NOTE — Unmapped (Signed)
KA, this patient would like to have visco for her right knee as well. We did her first left knee injection today. Would it be possible to add the right?

## 2021-07-10 NOTE — Unmapped (Signed)
Left knee severe medial compartment osteoarthritis, Gelsyn injection #1/3 (Dr. Zenda Alpers; improvement with previous cortisone injections and visco)    We discussed the Gelsyn injection series for the left knee and the patient desired to proceed.    Procedure: After sterile prep, 2ml Gelsyn was injected into the left knee. The patient tolerated the procedure well.    Follow-up in 1 week for the second left knee Gelsyn injection.  She would also like to have a viscosupplementation injection series for the right knee.  We will try to get this arranged with her insurance and see her back next week for her first right knee viscosupplementation and second left knee viscosupplementation injections.

## 2021-07-11 DIAGNOSIS — E7800 Pure hypercholesterolemia, unspecified: Principal | ICD-10-CM

## 2021-07-11 MED ORDER — ROSUVASTATIN 10 MG TABLET
ORAL_TABLET | 1 refills | 0 days
Start: 2021-07-11 — End: ?

## 2021-07-13 DIAGNOSIS — N760 Acute vaginitis: Principal | ICD-10-CM

## 2021-07-13 MED ORDER — FLUCONAZOLE 150 MG TABLET
ORAL_TABLET | 0 refills | 0.00000 days | Status: CP
Start: 2021-07-13 — End: ?

## 2021-07-13 MED ORDER — ROSUVASTATIN 10 MG TABLET
ORAL_TABLET | 1 refills | 0 days | Status: CP
Start: 2021-07-13 — End: ?

## 2021-07-13 NOTE — Unmapped (Signed)
Patient is requesting the following refill  Requested Prescriptions     Pending Prescriptions Disp Refills    rosuvastatin (CRESTOR) 10 MG tablet [Pharmacy Med Name: ROSUVASTATIN 10MG  TABLETS] 90 tablet 1     Sig: TAKE 1 TABLET(10 MG) BY MOUTH EVERY NIGHT       Recent Visits  Date Type Provider Dept   06/08/21 Office Visit Venkat Rama Dallas Breeding, MD [facility]   05/05/21 Telemedicine Venkat Rama Dallas Breeding, MD [place] [facility]   04/15/21 Telemedicine Venkat Rama Dallas Breeding, MD [place] [facility]   04/13/21 Office Visit Venkat Rama Dallas Breeding, MD [place] [facility]   04/01/21 Office Visit Venkat Rama Dallas Breeding, MD [place] [facility]   03/02/21 Office Visit Venkat Rama Dallas Breeding, MD [place] [facility]   02/12/21 Office Visit Venkat Rama Dallas Breeding, MD [place] [facility]   12/23/20 Office Visit Venkat Rama Dallas Breeding, MD [place] [facility]   09/25/20 Telemedicine Venkat Rama Dallas Breeding, MD [place] [facility]   08/26/20 Office Visit Kaddijatou Henriette Combs, PA [place] [facility]   Showing recent visits within past 365 days with a meds authorizing provider and meeting all other requirements  Future Appointments  Date Type Provider Dept   07/23/21 Appointment Venkat Rama Dallas Breeding, MD [place] [facility]   Showing future appointments within next 365 days with a meds authorizing provider and meeting all other requirements       Labs: Cholesterol:   Cholesterol (mg/dL)   Date Value   60/45/4098 134     Cholesterol, Total (mg/dL)   Date Value   11/91/4782 238 (H)   ,   Triglycerides (mg/dL)   Date Value   95/62/1308 159 (H)   01/02/2015 150   ,   HDL (mg/dL)   Date Value   65/78/4696 45   01/02/2015 60   ,   LDL calculated (mg/dl)   Date Value   29/52/8413 149 (H)     LDL Calculated (mg/dL)   Date Value   24/40/1027 57

## 2021-07-13 NOTE — Unmapped (Signed)
Unable to complete refill request, medication is not included in the refill protocol. .   Please advise on how to proceed.  Patient is requesting the following refill  Requested Prescriptions     Pending Prescriptions Disp Refills    fluconazole (DIFLUCAN) 150 MG tablet [Pharmacy Med Name: FLUCONAZOLE 150MG  TABLETS] 1 tablet 0     Sig: TAKE 1 TABLET BY MOUTH 1 TIME AS NEEDED FOR VAGINAL YEAST INFECTION       Recent Visits  Date Type Provider Dept   06/08/21 Office Visit Venkat Rama Dallas Breeding, MD [facility]   05/05/21 Telemedicine Venkat Rama Dallas Breeding, MD [place] [facility]   04/15/21 Telemedicine Venkat Rama Dallas Breeding, MD [place] [facility]   04/13/21 Office Visit Venkat Rama Dallas Breeding, MD [place] [facility]   04/01/21 Office Visit Venkat Rama Dallas Breeding, MD [place] [facility]   03/02/21 Office Visit Venkat Rama Dallas Breeding, MD [place] [facility]   02/12/21 Office Visit Venkat Rama Dallas Breeding, MD [place] [facility]   12/23/20 Office Visit Venkat Rama Dallas Breeding, MD [place] [facility]   09/25/20 Telemedicine Venkat Rama Dallas Breeding, MD [place] [facility]   08/26/20 Office Visit Kaddijatou Henriette Combs, PA [place] [facility]   Showing recent visits within past 365 days with a meds authorizing provider and meeting all other requirements  Future Appointments  Date Type Provider Dept   07/23/21 Appointment Venkat Rama Dallas Breeding, MD [place] [facility]   Showing future appointments within next 365 days with a meds authorizing provider and meeting all other requirements       Labs: Not applicable this refill

## 2021-07-14 DIAGNOSIS — G44221 Chronic tension-type headache, intractable: Principal | ICD-10-CM

## 2021-07-14 DIAGNOSIS — G43019 Migraine without aura, intractable, without status migrainosus: Principal | ICD-10-CM

## 2021-07-14 MED ORDER — NORTRIPTYLINE 25 MG CAPSULE
ORAL_CAPSULE | 1 refills | 0 days | Status: CP
Start: 2021-07-14 — End: ?

## 2021-07-14 NOTE — Unmapped (Signed)
Last Visit Date: Visit date not found  Next Visit Date: Visit date not found    No results found for: CBC, CMP     Results for orders placed or performed during the hospital encounter of 07/22/17   ECG 12 Lead   Result Value Ref Range    EKG Systolic BP  mmHg    EKG Diastolic BP  mmHg    EKG Ventricular Rate 58 BPM    EKG Atrial Rate 58 BPM    EKG P-R Interval 138 ms    EKG QRS Duration 82 ms    EKG Q-T Interval 386 ms    EKG QTC Calculation 378 ms    EKG Calculated P Axis 43 degrees    EKG Calculated R Axis 21 degrees    EKG Calculated T Axis 8 degrees    QTC Fredericia 381 ms

## 2021-07-14 NOTE — Unmapped (Signed)
Called but unable to leave a voicemail re: upcoming procedure appointment:    Appt date: Thursday, June 22  Appt time: 1030, advised to arrive 30 mins prior to app at 1000  Physician: Dr Oda Kilts  Location:  [address]  Clinic phone number: [phone]  Driver? NO  Advised to call clinic if experiencing COVID symptoms, signs of infection or recent use of antibiotics or steroids.    [facility]  [address], [address]

## 2021-07-16 ENCOUNTER — Ambulatory Visit: Admit: 2021-07-16 | Discharge: 2021-07-16 | Payer: MEDICARE | Attending: Anesthesiology | Primary: Anesthesiology

## 2021-07-16 ENCOUNTER — Ambulatory Visit: Admit: 2021-07-16 | Discharge: 2021-07-16 | Payer: MEDICARE

## 2021-07-16 DIAGNOSIS — M7918 Myalgia, other site: Principal | ICD-10-CM

## 2021-07-16 DIAGNOSIS — M5417 Radiculopathy, lumbosacral region: Principal | ICD-10-CM

## 2021-07-16 MED ADMIN — BUPivacaine HCl (MARCAINE) 0.25 % (2.5 mg/mL) injection 50 mg: 20 mL | @ 15:00:00 | Stop: 2021-07-16

## 2021-07-16 NOTE — Unmapped (Addendum)
Trigger Point Injection of 3+ muscle groups: Bilateral cervical, thoracic and lumbar paraspinals, bilateral trapezius, rhomboids, and levator scapulae, bilateral quadratus and gluteus medius  Pre-operative diagnosis: Myofascial pain  Post-operative diagnosis: Same  Attending Physician: Dr. Criss Rosales  Assistant: Dr. Manson Passey    Brief HPI:  The patient is a 51 y.o. female with past medical history of pseudotumor cerebri, bilateral knee osteoarthritis, myofascial pain syndrome, L5-S1 spinal fusion in 2001, coccydynia. She presents today for treatment of her myofascial paracervical, shoulders, upper mid and lower back areas with repeat trigger point injections, last done in May 2023 with good benefit.      6/22 - Last 5/5, which were 80% effective for 1 month, returned to baseline only recently. Patient also endorses bilateral low back pain with radiation to the BLEs into the feet. Has positive slump test bilaterally today. Had last caudal ESI on 08/21/20 with 85% relief for 10 months until return to base line.    Will repeat caudal ESI and then address her low back pain thereafter to evaluate if repeat RFAs are indicated.   No new complaints or concerns today.       Serial consent updated on 07/16/21     After risks and benefits were explained including bleeding, infection, worsening of the pain, damage to the area being injected, weakness, allergic reaction to medications, vascular injection, pneumothorax and nerve damage, signed consent was obtained.  All questions were answered.   The patient is not taking antiplatelet or anticoagulation medications and does not have a driver today.    The area of the trigger point was identified and the skin prepped with ChloraPrep  Next, a 25 gauge 1.5 inch needle was placed in the area of the trigger points in the lumbar paraspinals and the gluteus medius; a 27 gauge 0.5 inch needle was used for everything else above.  Dry needling was performed in the area of the trigger point. Once reproduction of the pain was elicited and negative aspiration confirmed, the trigger point was injected with 0.5-1 mL of 0.25% bupivacaine and the needle removed.     The patient did tolerate the procedure well and there were not complications.      Trigger points injected: 20    Trigger point locations:  bilateral cervical, thoracic and lumbar paraspinals, bilateral trapezius, rhomboids, and levator scapulae, bilateral quadratus, and gluteus medius    The patient was monitored for 15 minutes after the procedure.  Vital signs remained normal and the patient ambulated out of clinic    Pre-procedure pain score: 6/10  Post-procedure pain score: 7/10    DISPO:  - Repeat TPI ordered for 6-8 weeks  - Repeat Caudal ESI ordered.  Previously done 07/2020 with 85% relief for 10 months.  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        Orders Placed This Encounter   Procedures    Trigger Point Injs 3+ Muscles [phone])     TPIs, Dr. Oda Kilts, 15 or 30 mins, quad or spine center, no PIV, no driver needed  Has serial consent (from 07/16/21)  Not on anticoagulation   6-8 weeks please     Standing Status:   Future     Standing Expiration Date:   07/17/2022    [facility] DX/THER SBST INTRLMNR LMBR/SAC W/IMG GDN [phone])  Caudal ESI, Dr. Oda Kilts, 30 mins, quad or spine center, no PIV but will need driver  Not on anticoagulation     Standing Status:   Future     Standing Expiration Date:   07/17/2022       Answers submitted by the patient for this visit:  Back Pain Questionnaire (Submitted on 07/11/2021)  Chief Complaint: Back pain  Chronicity: chronic  Onset: more than 1 year ago  Frequency: constantly  Progression since onset: gradually worsening  Pain location: gluteal, lumbar spine, sacro-iliac, thoracic spine  Pain quality: aching, burning, cramping, shooting, stabbing  Radiates to: left foot, left knee, left thigh, right foot, right knee, right thigh  Pain - numeric: 7/10  Pain is: the same all the time  Aggravated by: bending, coughing, position, lying down, sitting, standing, twisting  Stiffness is present: all day  bladder incontinence: No  bowel incontinence: No  chest pain: No  dysuria: No  fever: No  headaches: Yes  leg pain: Yes  numbness: Yes  paresis: No  paresthesias: Yes  pelvic pain: No  perianal numbness: No  tingling: Yes  weakness: Yes  weight loss: No  Risk factors: menopause

## 2021-07-16 NOTE — Unmapped (Signed)
Reviewed AVS instructions with patient and she verbalized understanding.

## 2021-07-16 NOTE — Unmapped (Signed)
POST PROCEDURE INSTRUCTIONS   You may apply an ice pack 20-30 minutes at a time to the injection site if you experience soreness.     Keep the injection site clean and dry. You make remove the band-aid one day following the procedure.     You may take a shower but AVOID getting in to baths, pools or whirlpools for 48 HOURS AFTER THE PROCEDURE.       ACTIVITY   Refrain from heavy activity for the next 24 to 48 hours. General walking is okay. You may resume your normal activities the day following the procedure.   You may start or resume your individualized exercise program or physical therapy 48 hours after the procedure.   MEDICATIONS   Please note that it is okay to continue other prescribed medications (blood pressure, insulin, water pill, depression/anxiety pill, etc.) as well as other prescribed pain medications such as Neurontin, Lyrical, Celebrex, Ultram, Vicodin, Norco and acetaminophen (Tylenol).   SIDE EFFECTS   Increase in pain during the first 24 to 48 hours.     You might experience:   1. Mild to moderate swelling at the joint.   2. Possible bruising at the injection site.     WHEN TO CALL THE DOCTOR/NURSE   Severe pain, worse or different that the pain you had before the procedure.     Fever or chills.     Redness, or swelling around the injection site.     Call the Pain Management  Procedural nurses [phone],  during normal business hours (7 am-3 pm). If it is AFTER HOURS or during a weekend or holiday, call the hospital operator and ask for the Anesthesia Pain physician on call at [phone].   FOR EMERGENCIES, CALL 911 OR GO TO THE NEAREST HOSPITAL EMERGENCY DEPARTMENT.   ?   TO SCHEDULE APPOINTMENTS OR FOR QUESTIONS RELATED TO MEDICATIONS   Call the Pain Management Clinic at [phone]

## 2021-07-18 DIAGNOSIS — R072 Precordial pain: Principal | ICD-10-CM

## 2021-07-18 MED ORDER — PANTOPRAZOLE 40 MG TABLET,DELAYED RELEASE
ORAL_TABLET | 4 refills | 0 days
Start: 2021-07-18 — End: ?

## 2021-07-20 ENCOUNTER — Institutional Professional Consult (permissible substitution): Admit: 2021-07-20 | Discharge: 2021-07-21 | Payer: MEDICARE | Attending: Sports Medicine | Primary: Sports Medicine

## 2021-07-20 DIAGNOSIS — M170 Bilateral primary osteoarthritis of knee: Principal | ICD-10-CM

## 2021-07-20 MED ORDER — PANTOPRAZOLE 40 MG TABLET,DELAYED RELEASE
ORAL_TABLET | 4 refills | 0 days | Status: CP
Start: 2021-07-20 — End: ?

## 2021-07-20 MED ADMIN — sodium hyaluronate (viscosup) (GELSYN-3) 16.8 mg/2 mL injection 16.8 mg: 16.8 mg | INTRA_ARTICULAR | @ 14:00:00 | Stop: 2021-07-20

## 2021-07-20 NOTE — Unmapped (Signed)
Lg Joint Inj: bilateral knee on 07/20/2021 9:30 AM  Indications: pain  Details: 22 G needle  Laterality: bilateral  Location: knee  Medications (Right): 16.8 mg sodium hyaluronate (viscosup) 16.8 mg/2 mL  Medications (Left): 16.8 mg sodium hyaluronate (viscosup) 16.8 mg/2 mL  Procedure, treatment alternatives, risks and benefits explained, specific risks discussed. Consent was given by the patient. Immediately prior to procedure a time out was called to verify the correct patient, procedure, equipment, support staff and site/side marked as required. Patient was prepped and draped in the usual sterile fashion.     Provider Attestation: The information documented by members of my medical care team was reviewed and verified for accuracy by me.

## 2021-07-23 ENCOUNTER — Ambulatory Visit: Admit: 2021-07-23 | Discharge: 2021-07-24 | Payer: MEDICARE | Attending: Family Medicine | Primary: Family Medicine

## 2021-07-23 DIAGNOSIS — Z01419 Encounter for gynecological examination (general) (routine) without abnormal findings: Principal | ICD-10-CM

## 2021-07-23 DIAGNOSIS — R232 Flushing: Principal | ICD-10-CM

## 2021-07-23 DIAGNOSIS — E0590 Thyrotoxicosis, unspecified without thyrotoxic crisis or storm: Principal | ICD-10-CM

## 2021-07-23 DIAGNOSIS — N760 Acute vaginitis: Principal | ICD-10-CM

## 2021-07-23 DIAGNOSIS — Z23 Encounter for immunization: Principal | ICD-10-CM

## 2021-07-23 DIAGNOSIS — R300 Dysuria: Principal | ICD-10-CM

## 2021-07-23 DIAGNOSIS — R8271 Bacteriuria: Principal | ICD-10-CM

## 2021-07-23 DIAGNOSIS — Z79890 Hormone replacement therapy (postmenopausal): Principal | ICD-10-CM

## 2021-07-23 DIAGNOSIS — Z113 Encounter for screening for infections with a predominantly sexual mode of transmission: Principal | ICD-10-CM

## 2021-07-23 LAB — URINALYSIS WITH MICROSCOPY
BILIRUBIN UA: NEGATIVE
GLUCOSE UA: NEGATIVE
HYPHAL YEAST: NONE SEEN /HPF
KETONES UA: NEGATIVE
LEUKOCYTE ESTERASE UA: NEGATIVE
NITRITE UA: NEGATIVE
PH UA: 7 (ref 5.0–8.0)
PROTEIN UA: NEGATIVE
RBC UA: 3 /HPF (ref 0–3)
SPECIFIC GRAVITY UA: 1.012 (ref 1.005–1.030)
SQUAMOUS EPITHELIAL: <1 /HPF (ref 0–5)
UROBILINOGEN UA: 0.2
WBC CLUMPS: NONE SEEN /HPF
WBC UA: 1 /HPF (ref 0–2)
YEAST: NONE SEEN /HPF

## 2021-07-23 MED ORDER — SHINGRIX (PF) 50 MCG/0.5 ML INTRAMUSCULAR SUSPENSION, KIT
Freq: Once | INTRAMUSCULAR | 1 refills | 1 days | Status: CP
Start: 2021-07-23 — End: 2021-07-23

## 2021-07-23 NOTE — Unmapped (Signed)
Name:  Natalie [name]  DOB: [birth year]  Today's Date: 07/23/2021  Age:  51 y.o.    Assessment and Plan:     Natalie [name] was seen today for follow-up.    Diagnoses and all orders for this visit:    Acute vaginitis  -     Vaginitis Molecular Panel; Future  -     Chlamydia/Gonorrhoeae NAA; Future  -     Vaginitis Molecular Panel  -     Chlamydia/Gonorrhoeae NAA    Need for shingles vaccine  -     varicella-zoster gE-AS01B, PF, (SHINGRIX, PF,) 50 mcg/0.5 mL SusR injection; Inject 0.5 mL into the muscle once for 1 dose. Repeat 2nd dose in 2-6 months    Hot flashes  -     Ambulatory referral to Gynecology; Future  -     Estradiol (Estrogen) Level; Future  -     Follicle Stimulating Hormone Level [facility]); Future  -     TSH; Future  -     Comprehensive Metabolic Panel; Future    Hormone replacement therapy (postmenopausal)  -     Ambulatory referral to Gynecology; Future    Women's annual routine gynecological examination  -     Ambulatory referral to Gynecology; Future    Screen for sexually transmitted diseases  -     Vaginitis Molecular Panel; Future  -     Chlamydia/Gonorrhoeae NAA; Future  -     Vaginitis Molecular Panel  -     Chlamydia/Gonorrhoeae NAA    Dysuria  -     Urinalysis with Microscopy; Future  -     Urinalysis with Microscopy    Subclinical hyperthyroidism  -     TSH; Future      To evaluate her complaints of hot flashes, I will get sex hormones, and TSH checked.  We will reorder the labs.    After reviewing the labs, I Will consider adding an SSRI like paroxetine or venlafaxine, Vs increasing the estradiol dose.    She used to see GYN in the past, when she was earlier evaluated for menopausal symptoms, and was on oral and topical estrogen supplementation, and also progesterone supplement.  I will make a referral to another GYN, for further management of her hormone replacement therapy.    Medication adherence and barriers to the treatment plan have been addressed. Opportunities to optimize healthy behaviors have been discussed. Patient / caregiver voiced understanding.      HPI:      Natalie [name]  is here for   Chief Complaint   Patient presents with    Follow-up     She feels like she has irritation in vulvar region. No vaginal /vulvar discharge. Occasional itching and odor. Symptoms are more noticeable if she wears swim suit for long time. She does water aerobics 4 days a week, and swims on the other days.    Occasional episodes of hot flashes, sweating, followed by cool sensation, going on for the last 2 months. It could be during daytime, or night.  She will be excessively sweating at night, which would wake her up from sleep. Almost daily basis.  Occasional vaginal dryness, and dysperunia.  No recent anxiety / stress.  Has had hysterectomy with ovaries removed in 2007, due to uterine prolapse.    She has been on estradiol since hysterectomy. Uses vagifem from old script once or twice a week.    She hasn't had hormonal studies as recommended at last visit.  ASCVD risk:  The 10-year ASCVD risk score (Arnett DK, et al., 2019) is: 2.9%    Values used to calculate the score:      Age: 63 years      Sex: Female      Is Non-Hispanic African American: Yes      Diabetic: No      Tobacco smoker: No      Systolic Blood Pressure: 126 mmHg      Is BP treated: Yes      HDL Cholesterol: 45 mg/dL      Total Cholesterol: 134 mg/dL    Note: For patients with SBP <90 or >200, Total Cholesterol <130 or >320, HDL <20 or >100 which are outside of the allowable range, the calculator will use these upper or lower values to calculate the patient???s risk score.       Past Medical/Surgical History:     Past Medical History:   Diagnosis Date    Allergic conjunctivitis 11/23/2018    Anemia 1986    Arthritis     Breast mass     Chondromalacia of left knee     Chondromalacia of right knee     Depressive disorder     Fibrocystic breast     GERD (gastroesophageal reflux disease)     Hematuria     History of kidney stones History of transfusion     Hyperlipidemia 1999    Hypothyroidism     Lumbar disc disease 1998    She was injured at work at the age of 44 and then had disk surgery 2 years later     Menopause ovarian failure     Menopause, premature     Migraine with aura     Neuromuscular disorder (CMS-HCC) 1999    Obesity (BMI 30-39.9) 11/23/2018    Ovarian cyst     Plantar fasciitis     Pseudotumor cerebri     Rash due to allergy 11/23/2018    Sickle cell trait (CMS-HCC)     Urinary tract infection     Vaginitis      Past Surgical History:   Procedure Laterality Date    BACK SURGERY  2001    BREAST BIOPSY Bilateral     When patient was 16 & 17 Both Negative    HYSTERECTOMY  2008    PR REMOVAL OF TONSILS,13+ Y/O Bilateral 10/10/2019    Procedure: TONSILLECTOMY;  Surgeon: Lona Millard, MD;  Location: OR [place];  Service: ENT    SALPINGOOPHORECTOMY Bilateral 2007    SPINE SURGERY  2001    TOTAL VAGINAL HYSTERECTOMY  01/04/2006    Uterine Prolapse-Dr. Leeanne Rio OP Note    TUBAL LIGATION  1995       Family History:     Family History   Problem Relation Age of Onset    Heart attack Father     Mental illness Father     Asthma Father     Hypertension Mother     Heart disease Mother     Arthritis Mother     Depression Mother     Drug abuse Mother     Mental illness Mother     Ulcers Mother     COPD Mother     Heart attack Maternal Grandmother     Heart disease Maternal Grandmother     Hypertension Maternal Grandmother     Thyroid disease Maternal Grandmother     Breast cancer Cousin 65    Cancer Sister  lymphoma    COPD Maternal Aunt     Depression Son     Drug abuse Son     Mental illness Son     Stroke Paternal Grandfather     Thyroid disease Other     Alcohol abuse Neg Hx        Social History:     Social History     Socioeconomic History    Marital status: Single     Spouse name: None    Number of children: 2    Years of education: 15    Highest education level: None   Occupational History     Employer: NOT EMPLOYED   Tobacco Use    Smoking status: Never     Passive exposure: Past    Smokeless tobacco: Never   Vaping Use    Vaping Use: Never used   Substance and Sexual Activity    Alcohol use: Never    Drug use: Never    Sexual activity: Yes     Partners: Male     Birth control/protection: Post-menopausal, Surgical     Comment: steady partner for 4 yrs, as on 04/03/19.   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, forensic)     Country of Origin - [place]                      Social Determinants of Psychologist, prison and probation services Strain: Medium Risk    Difficulty of Paying Living Expenses: Somewhat hard   Food Insecurity: Geophysicist/field seismologist Present    Worried About Programme researcher, broadcasting/film/video in the Last Year: Sometimes true    Barista in the Last Year: Often true   Transportation Needs: Mudlogger (Medical): Yes    Lack of Transportation (Non-Medical): Yes       Allergies:     Buprenorphine hcl, Pregabalin, Adhesive tape-silicones, Doxycycline hyclate (bulk), Keflex [cephalexin], Opioids - morphine analogues, and Oxycodone-acetaminophen    Current Medications:     Current Outpatient Medications   Medication Sig Dispense Refill    albuterol HFA 90 mcg/actuation inhaler Inhale 2 puffs every four (4) hours as needed for wheezing. 54 g 0    azelastine (ASTELIN) 137 mcg (0.1 %) nasal spray 2 sprays into each nostril Two (2) times a day. 30 mL 12    azelastine (OPTIVAR) 0.05 % ophthalmic solution Administer 2 drops to both eyes daily as needed. 6 mL 5    baclofen (LIORESAL) 20 MG tablet 10 - 20 mg three times a day as needed for spasms. 90 tablet 2    cetirizine (ZYRTEC) 10 MG tablet diclofenac sodium (PENNSAID) 20 mg/gram /actuation(2 %) sopm Apply 2 pumps to affected area up to four times a day. 112 g 2    EPINEPHrine (EPIPEN) 0.3 mg/0.3 mL injection Inject 0.3 mL (0.3 mg total) into the muscle once for 1 dose. 2 each 0    estradioL (ESTRACE) 0.5 MG tablet TAKE  1 TABLET(0.5 MG) BY MOUTH DAILY 90 tablet 0    gabapentin (NEURONTIN) 800 MG tablet TAKE 1 TABLET(800 MG) BY MOUTH TWICE DAILY 60 tablet 2    hydroCHLOROthiazide (HYDRODIURIL) 12.5 MG tablet TAKE 1 TABLET(12.5 MG) BY MOUTH DAILY AS NEEDED FOR SWELLING 90 tablet 1    nortriptyline (PAMELOR) 25 MG capsule TAKE 1 CAPSULE(25 MG) BY MOUTH EVERY NIGHT 90 capsule 1    olopatadine (PATANOL) 0.1 % ophthalmic solution Administer 1 drop to both eyes Two (2) times a day. 5 mL 1    omalizumab (XOLAIR) 150 mg/mL syringe Inject the contents of 2 syringes (300 mg total) under the skin every twenty-eight (28) days. 2 mL 12    oxyCODONE-acetaminophen (PERCOCET) 10-325 mg per tablet Take 1 tablet by mouth every six (6) hours as needed for pain. Max 4 tabs/day. Fill on or after: 07/10/21. Brand name only due to allergy. 120 tablet 0    pantoprazole (PROTONIX) 40 MG tablet TAKE 1 TABLET(40 MG) BY MOUTH DAILY 30 tablet 4    potassium chloride 20 MEQ CR tablet Take 1 tablet (20 mEq total) by mouth daily. 90 tablet 2    PROCHAMBER Spcr       rizatriptan (MAXALT-MLT) 10 MG disintegrating tablet Take 1 tablet by mouth at ONSET of migraine or aura. May repeat in 2 HOURS if needed. DO NOT EXCEED 2 doses/day, 2 days/week, 8 doses/month. 10 tablet 5    rosuvastatin (CRESTOR) 10 MG tablet TAKE 1 TABLET(10 MG) BY MOUTH EVERY NIGHT 90 tablet 1    triamcinolone (KENALOG) 0.1 % cream Apply 1 application topically two (2) times a day as needed. WHEN ALLERGIES FLARE-UP AND CAUSES RASH / ITCHING 80 g 0    XHANCE 93 mcg/actuation AerB 1 spray into each nostril two (2) times a day as needed. 16 mL 6    naloxone (NARCAN) 4 mg nasal spray One spray in either nostril once for known/suspected opioid overdose. May repeat every 2-3 minutes in alternating nostril til EMS arrives 1 each 0    [START ON 08/09/2021] oxyCODONE-acetaminophen (PERCOCET) 10-325 mg per tablet Take 1 tablet by mouth every six (6) hours as needed for pain. Max 4 tabs/day. Fill on or after: 08/09/21 Brand name only due to allergy. 120 tablet 0    [START ON 09/08/2021] oxyCODONE-acetaminophen (PERCOCET) 10-325 mg per tablet Take 1 tablet by mouth every six (6) hours as needed for pain. Max 4 tabs/day. Fill on or after: 09/08/21. Brand name only due to allergy. 120 tablet 0    varicella-zoster gE-AS01B, PF, (SHINGRIX, PF,) 50 mcg/0.5 mL SusR injection Inject 0.5 mL into the muscle once for 1 dose. Repeat 2nd dose in 2-6 months 0.5 mL 1     No current facility-administered medications for this visit.       Health Maintenance:     Health Maintenance Summary w/Most Recent Date    -        Overdue - Zoster Vaccines (1 of 2) Overdue - never done      No completion history exists for this topic.              Ordered - Serum Creatinine Monitoring (Yearly) Ordered on 07/23/2021      02/16/2021  Creatinine component of Comprehensive Metabolic Panel    04/02/2020  Creatinine component of Basic Metabolic Panel    01/09/2020  Creatinine component of Comprehensive Metabolic Panel    10/10/2019  Creatinine component of Basic Metabolic Panel    04/03/2019  Creatinine component of Comprehensive Metabolic Panel    Only the first 5 history entries have been loaded, but more history exists.              Ordered - Potassium Monitoring (Yearly) Ordered on 07/23/2021      02/16/2021  Potassium component of Comprehensive Metabolic Panel    04/02/2020  Potassium component of Basic Metabolic Panel    01/09/2020  Potassium component of Comprehensive Metabolic Panel    10/10/2019  Potassium component of Basic Metabolic Panel    04/03/2019  Potassium component of Comprehensive Metabolic Panel    Only the first 5 history entries have been loaded, but more history exists.              Mammogram Start Age 42 (Yearly) Next due on 02/23/2022      02/23/2021  Mammo Digital Screening W Tomo Bilateral W CAD    08/28/2019  Mammo Digital Diagnostic Tomo Bilateral W Cad    08/27/2019  Mammo Digital Diagnostic Bilateral W CAD    08/31/2018  [facility] MAMMOGRAPHY    08/31/2018  Mammo Digital Screening Bilateral W CAD    Only the first 5 history entries have been loaded, but more history exists.              DTaP/Tdap/Td Vaccines (2 - Td or Tdap) Next due on 09/29/2026      09/28/2016  Imm Admin: TdaP              Ordered - Colon Cancer Screening (Colonoscopy - Every 10 Years) Ordered on 02/25/2021      02/24/2021  Colonoscopy              Hepatitis C Screen  Completed      11/21/2014  Hepatitis C Antibody    10/27/2010  Hepatitis C Antibody              Influenza Vaccine (Series Information) Completed      09/25/2020  Imm Admin: INFLUENZA INJ MDCK PF, QUAD,(FLUCELVAX)(22MO AND UP EGG FREE)    11/05/2019  Imm Admin: INFLUENZA INJ MDCK PF, QUAD,(FLUCELVAX)(22MO AND UP EGG FREE)    09/28/2018  Imm Admin: Influenza Virus Vaccine, unspecified formulation    09/21/2018  Imm Admin: Influenza Vaccine Quad (IIV4 PF)(Afluria)52mo-Adult    10/11/2017  Imm Admin: Influenza Virus Vaccine, unspecified formulation    Only the first 5 history entries have been loaded, but more history exists.              COVID-19 Vaccine (Series Information) Completed      01/08/2021  Imm Admin: COVID-19 VAC,BIVALENT(54YR UP)BOOST,MODERNA    12/24/2020  Imm Admin: COVID-19 VAC,BIVALENT,MODERNA    04/02/2020  Imm Admin: COVID-19 VACCINE,MRNA(MODERNA)(PF)    05/14/2019  Imm Admin: COVID-19 VACC,MRNA,(PFIZER)(PF)    04/23/2019  Imm Admin: COVID-19 VACC,MRNA,(PFIZER)(PF)              Pneumococcal Vaccine 0-64 (Series Information) Aged Out      No completion history exists for this topic.                    Immunizations:     Immunization History   Administered Date(s) Administered    COVID-19 VAC,BIVALENT,MODERNA(BLUE CAP) 12/24/2020, 01/08/2021    COVID-19 VACC,MRNA,(PFIZER)(PF) 04/23/2019, 05/14/2019    COVID-19 VACCINE,MRNA(MODERNA)(PF) 04/02/2020    INFLUENZA INJ MDCK PF, QUAD,(FLUCELVAX)(22MO AND UP EGG FREE) 11/05/2019, 09/25/2020    INFLUENZA TIV (TRI) 22MO+ W/ PRESERV (IM) 02/07/2012    Influenza Vaccine Quad (IIV4  PF)(Afluria)34mo-Adult 09/21/2018    Influenza Virus Vaccine, unspecified formulation 09/26/2014, 09/28/2015, 08/25/2016, 10/11/2017, 09/28/2018    PPD Test 05/23/2017, 01/24/2018, 05/06/2021    TdaP 09/28/2016       I have reviewed and (if needed) updated the patient's problem list, medications, allergies, past medical and surgical history, social and family history.    ROS:     Review of Systems    Vital Signs:     Wt Readings from Last 3 Encounters:   07/23/21 72.2 kg (159 lb 1.6 oz)   07/20/21 72.1 kg (159 lb)   07/10/21 72.1 kg (159 lb)     Temp Readings from Last 3 Encounters:   07/23/21 36.4 ??C (97.6 ??F)   07/16/21 36.3 ??C (97.3 ??F) (Temporal)   07/06/21 36.6 ??C (97.9 ??F) (Skin)     BP Readings from Last 3 Encounters:   07/23/21 126/82   07/20/21 145/90   07/16/21 116/79     Pulse Readings from Last 3 Encounters:   07/23/21 80   07/20/21 85   07/16/21 87     Body mass index is 30.06 kg/m??.    Objective:      Physical Exam  Vitals and nursing note reviewed. Exam conducted with a chaperone present.   Constitutional:       Appearance: She is well-developed. She is not diaphoretic.   Neck:      Thyroid: No thyromegaly.      Vascular: No JVD.      Comments: No thyromegaly.  Cardiovascular:      Rate and Rhythm: Normal rate and regular rhythm.      Heart sounds: Normal heart sounds. No murmur heard.  Pulmonary:      Effort: Pulmonary effort is normal. No respiratory distress.      Breath sounds: Normal breath sounds. No wheezing.   Abdominal:      Palpations: Abdomen is soft.      Tenderness: There is no abdominal tenderness. There is no right CVA tenderness or left CVA tenderness.   Genitourinary:     General: Normal vulva.      Exam position: Lithotomy position.      Labia:         Right: No rash.         Left: No rash.       Urethra: No prolapse or urethral lesion.      Vagina: Vaginal discharge present.      Uterus: Absent.       Comments: Examined with LPN Ms. Cyndi Bender as chaperone.    Thick whitish discharge noticed in the vaginal region.  Bimanual pelvic exam not done, as she does not have ovaries and uterus.  Musculoskeletal:      Cervical back: Neck supple.   Neurological:      Mental Status: She is alert and oriented to person, place, and time.   Psychiatric:         Mood and Affect: Mood normal.         Behavior: Behavior normal.         Thought Content: Thought content normal.         Judgment: Judgment normal.         POC Testing and Recent Labs:     Results for orders placed or performed in visit on 05/12/21   Serum Protein Electrophoresis   Result Value Ref Range    T Albumin 4.1 3.5 - 5.0 g/dL    Alpha-1 Globulin 0.3 0.2 -  0.5 g/dL    Alpha-2 Globulin 0.7 0.5 - 1.1 g/dL    Beta-1 Globulin 0.4 0.3 - 0.6 g/dL    Beta-2 Globulin 0.5 0.2 - 0.6 g/dL    Gammaglobulin 1.4 0.5 - 1.5 g/dL    SPE Interpretation      Total Protein 7.4 g/dL   Erythrocyte sedimentation rate (ESR)   Result Value Ref Range    Sed Rate 47 (H) 0 - 30 mm/h   C-reactive protein (CRP)   Result Value Ref Range    CRP <4.0 <=10.0 mg/L   Anti-Nuclear Antibody (ANA)   Result Value Ref Range    Antinuclear Antibodies (ANA) Negative Negative   CBC w/ Differential   Result Value Ref Range    WBC 6.0 3.6 - 11.2 10*9/L    RBC 4.32 3.95 - 5.13 10*12/L    HGB 13.0 11.3 - 14.9 g/dL    HCT 01.0 27.2 - 53.6 %    MCV 91.2 77.6 - 95.7 fL    MCH 30.2 25.9 - 32.4 pg    MCHC 33.1 32.0 - 36.0 g/dL    RDW 64.4 03.4 - 74.2 %    MPV 10.3 6.8 - 10.7 fL    Platelet 229 150 - 450 10*9/L    Neutrophils % 58.4 %    Lymphocytes % 29.7 %    Monocytes % 7.5 %    Eosinophils % 3.1 %    Basophils % 1.3 %    Absolute Neutrophils 3.5 1.8 - 7.8 10*9/L    Absolute Lymphocytes 1.8 1.1 - 3.6 10*9/L    Absolute Monocytes 0.5 0.3 - 0.8 10*9/L    Absolute Eosinophils 0.2 0.0 - 0.5 10*9/L    Absolute Basophils 0.1 0.0 - 0.1 10*9/L     *Note: Due to a large number of results and/or encounters for the requested time period, some results have not been displayed. A complete set of results can be found in Results Review.         Bernita Raisin, MD  07/23/2021

## 2021-07-27 NOTE — Unmapped (Unsigned)
Left knee severe medial compartment osteoarthritis, Gelsyn injection #3/3 (Dr. Zenda Alpers; improvement with previous cortisone injections and visco); Right knee osteoarthritis, Gelsyn injection #2/3    We discussed the Gelsyn injection series for the bilateral knee and the patient desired to proceed.    Procedure: After sterile prep, 2ml Gelsyn was injected into the left knee. The patient tolerated the procedure well.    Procedure: After sterile prep, 2ml Gelsyn was injected into the right knee. The patient tolerated the procedure well.    Follow-up in 1 week for the third right knee Gelsyn injection.

## 2021-08-03 ENCOUNTER — Institutional Professional Consult (permissible substitution): Admit: 2021-08-03 | Discharge: 2021-08-04 | Payer: MEDICARE

## 2021-08-03 DIAGNOSIS — M1712 Unilateral primary osteoarthritis, left knee: Principal | ICD-10-CM

## 2021-08-03 DIAGNOSIS — M170 Bilateral primary osteoarthritis of knee: Principal | ICD-10-CM

## 2021-08-03 DIAGNOSIS — M1711 Unilateral primary osteoarthritis, right knee: Principal | ICD-10-CM

## 2021-08-03 MED ADMIN — sodium hyaluronate (viscosup) (GELSYN-3) 16.8 mg/2 mL injection 16.8 mg: 16.8 mg | INTRA_ARTICULAR | @ 15:00:00 | Stop: 2021-08-03

## 2021-08-03 NOTE — Unmapped (Signed)
Left knee severe medial compartment osteoarthritis, Gelsyn injection #3/3 (Dr. Zenda Alpers; improvement with previous cortisone injections and visco); Right knee osteoarthritis, Gelsyn injection #3/3 (minimal improvement following first injections)    We discussed the Gelsyn injection series for the bilateral knee and the patient desired to proceed.    Procedure: After sterile prep, 2ml Gelsyn was injected into the left knee. The patient tolerated the procedure well.    Procedure: After sterile prep, 2ml Gelsyn was injected into the right knee. The patient tolerated the procedure well.    Follow-up in 1 week for the third right knee Gelsyn injection.

## 2021-08-04 ENCOUNTER — Telehealth: Admit: 2021-08-04 | Discharge: 2021-08-05 | Payer: MEDICARE | Attending: Psychologist | Primary: Psychologist

## 2021-08-04 DIAGNOSIS — F419 Anxiety disorder, unspecified: Principal | ICD-10-CM

## 2021-08-04 DIAGNOSIS — F1190 Opioid use, unspecified, uncomplicated: Principal | ICD-10-CM

## 2021-08-04 NOTE — Unmapped (Signed)
[facility] Administered Medication Refill Coordination Note      NAME:Natalie [name] DOB: September 10, 1970      Medication: Xolair    Day Supply: 28 days      SHIPPING      Next delivery from [facility] [phone]) to  [name]  for Natalie [name] is scheduled for n/a.    Clinic contact: Micki Riley    Patient's next nurse visit for administration: n/a clinic denied refill @ this time will follow-up in 3 weeks.    We will follow up with clinic monthly for standard refill processing and delivery.      Elton Catalano Samella Parr  Specialty Pharmacy Technician

## 2021-08-04 NOTE — Unmapped (Signed)
[facility]   Confidential Psychological Therapy Session    Patient Name: Natalie [name]  Medical Record Number: [id number]  Date of Service: August 04, 2021  Attending Psychologist: Caroline More, PhD  CPT Procedure Code: 81191 for 25 minutes of face to face counseling    This visit was performed face to face with interactive technology using a HIPPA compliant audio/visual platform. We reviewed confidentiality today. The patient was present in [place], a state in which this provider is licensed and able to provide care (location and contact information confirmed), attended this visit alone, and consented to this virtual pain psychology visit.    REFERRING PHYSICIAN: Criss Rosales, MD    CHIEF COMPLAINT AND REASON FOR REFERRAL:  COM follow up evaluation/pain coping skills; CBT/ACT for pain    SUBJECTIVE / HISTORY OF PRESENT ILLNESS: [name] is a very pleasant 51 y.o.  female with chronic lower back pain, chronic headaches, and myofascial pain. Her pain started in 1999 in her lower back.  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  She had a spinal fusion in 2001 at the L5-S1 level.  She has had bilateral shoulder pain, neck pain, lower back pain, and b/l knees.  She also described a radiculopathy b/l that goes from her back to her bilateral buttocks and down to her toes.      Pt initially established with Dr. Oda Kilts in 08/2016 and then was was first evaluated by me in 07/2017. Last follow up with me was 06/16/21. Pt participates actively today. She is in good spirits and reports improved mood and sleep. She is working as an Product manager 4 days a week now and is feeling better physically and emotionally. She now falls asleep without any sleep medication. She is tired at the end of the day and just falls asleep easily.  The patient notes she has received positive feedback from many individuals in her class and she feels good about her ability to continue to sobriety and the help of others.  Today we shortened our session because she has a court hearing to review her granddaughter's custody.  Apparently her granddaughter's mother was murdered last week.  She and the daughter's and has been sharing custody since the death, and they are going before the judge today to try to get custody of her.  Patient discusses how stressful this situation is.  After the court hearing she is going to work.  We scheduled a follow-up appointment next week to process the results of the hearing .     Patient still taking opioid pain medication. Notes I still have breakthrough pain. Processed her pain experience and overall improvement.  Takes opioid pain medication, often breaking in half. Pt seems more confident when talking about all other medications, somewhat anxious when discussing opioids. Will work slowly - did set a goal of decreasing need for opioid pain medication slowly over time.    Answers for HPI/ROS submitted by the patient on 08/03/2021  Chronicity: chronic  Onset: more than 1 year ago  Frequency: constantly  Progression since onset: unchanged  Pain location: gluteal, lumbar spine, sacro-iliac, thoracic spine  Pain quality: aching, burning, cramping, shooting, stabbing  Radiates to: left foot, left knee, left thigh, right foot, right knee  Pain - numeric: 9/10  Pain is: the same all the time  Aggravated by: bending, position, lying down, sitting, standing, twisting  Stiffness is present: all day  abdominal pain: No  bladder incontinence: No  bowel incontinence: No  chest pain: No  dysuria: No  fever: No  headaches: Yes  leg pain: Yes  numbness: Yes  paresis: No  paresthesias: Yes  pelvic pain: No  perianal numbness: No  tingling: Yes  weakness: No  weight loss: No  Risk factors: menopause, poor posture        OBJECTIVE / MENTAL STATUS:    Appearance:   Appears stated age and Clean/Neat   Motor:  No abnormal movements Speech/Language:   Normal rate, volume, tone, fluency   Mood:  Euthymic, improved   Affect:  Bright! Mood congruent   Thought process:  Logical, linear, clear, coherent, goal directed   Thought content:    Denies SI, HI, self harm, delusions, obsessions, paranoid ideation, or ideas of reference   Perceptual disturbances:    Denies auditory and visual hallucinations, behavior not concerning for response to internal stimuli   Orientation:  Oriented to person, place, time, and general circumstances   Attention:  Able to fully attend without fluctuations in consciousness   Concentration:  Able to fully concentrate and attend   Memory:  Immediate, short-term, long-term, and recall grossly intact    Fund of knowledge:   Consistent with level of education and development   Insight:    Fair   Judgment:   Intact   Impulse Control:  Intact       DIAGNOSTIC IMPRESSION:   Anxiety NOS  Chronic continuou use of opiates    ASSESSMENT:   [name] is a very pleasant 51 y.o.  female from [place], [place] with chronic lower back pain, chronic headaches, and myofascial pain. Her pain started in 1999 in her lower back.  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  She had a spinal fusion in 2001 at the L5-S1 level.  She has had bilateral shoulder pain, neck pain, lower back pain, and b/l knees.  She also described a radiculopathy b/l that goes from her back to her bilateral buttocks and down to her toes.  Pt initially met with Dr. Oda Kilts on 08/27/2016. Last follow up with Dr. Oda Kilts was on 07/14/2017 for Trigger Point Injection of 3+ muscle groups: cervical paraspinal, trapezius, rhomboids, levator scapulae, lumbar paraspinal, thoracic paraspinal, quadratus lumborum to address myofascial pain.    The patient is currently considered to be high risk due to prior nonadherence, but appropriate with a behavioral adherence plan in place.  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    PLAN:   (1) COM - high risk but remains appropriate with a behavioral adherence plan also in place.    -Patient MUST meet with pain psychology/me once a month.   -No additional overuse of opioids will be tolerated.    -Patient should always bring her medication to clinic for pill counts.  -Patient was informed she has had numerous infractions with respect to her pain contract over the years, and that no further infractions will be tolerated.    If any additional pain contract or behavioral adherence contract infractions occur, I recommend stopping opioids.    Pt doing well with adherence, per our last few visits.     (2) Pain coping skills- addressed compliance, as well as pacing and mindful breathing, body scan, and problem solving    (3) follow-up with me next month for a virtual  visit

## 2021-08-09 MED ORDER — OXYCODONE-ACETAMINOPHEN 10 MG-325 MG TABLET
ORAL_TABLET | Freq: Four times a day (QID) | ORAL | 0 refills | 30 days | Status: CP | PRN
Start: 2021-08-09 — End: ?

## 2021-08-10 ENCOUNTER — Institutional Professional Consult (permissible substitution): Admit: 2021-08-10 | Discharge: 2021-08-11 | Payer: MEDICARE

## 2021-08-10 DIAGNOSIS — M1711 Unilateral primary osteoarthritis, right knee: Principal | ICD-10-CM

## 2021-08-10 DIAGNOSIS — M170 Bilateral primary osteoarthritis of knee: Principal | ICD-10-CM

## 2021-08-10 MED ADMIN — sodium hyaluronate (viscosup) (GELSYN-3) 16.8 mg/2 mL injection 16.8 mg: 16.8 mg | INTRA_ARTICULAR | @ 15:00:00 | Stop: 2021-08-10

## 2021-08-10 NOTE — Unmapped (Signed)
Left knee severe medial compartment osteoarthritis, Gelsyn injection series completed (Dr. Zenda Alpers; improvement with previous cortisone injections and visco); Right knee osteoarthritis, Gelsyn injection #3/3 (50% improvement following first injections)    We discussed the Gelsyn injection series for the bilateral knee and the patient desired to proceed.    Procedure: After sterile prep, 2ml Gelsyn was injected into the right knee. The patient tolerated the procedure well.    Follow-up in 6 weeks as needed.

## 2021-08-12 ENCOUNTER — Telehealth: Admit: 2021-08-12 | Discharge: 2021-08-13 | Payer: MEDICARE | Attending: Psychologist | Primary: Psychologist

## 2021-08-12 DIAGNOSIS — F1190 Opioid use, unspecified, uncomplicated: Principal | ICD-10-CM

## 2021-08-12 DIAGNOSIS — F419 Anxiety disorder, unspecified: Principal | ICD-10-CM

## 2021-08-12 NOTE — Unmapped (Signed)
[facility]   Confidential Psychological Therapy Session    Patient Name: Natalie [name]  Medical Record Number: [id number]  Date of Service: August 12, 2021  Attending Psychologist: Caroline More, PhD  CPT Procedure Code: 81191 for 25 minutes of face to face counseling    This visit was performed face to face with interactive technology using a HIPPA compliant audio/visual platform. We reviewed confidentiality today. The patient was present in [place], a state in which this provider is licensed and able to provide care (location and contact information confirmed), attended this visit alone, and consented to this virtual pain psychology visit.    REFERRING PHYSICIAN: Criss Rosales, MD    CHIEF COMPLAINT AND REASON FOR REFERRAL:  COM follow up evaluation/pain coping skills; CBT/ACT for pain    SUBJECTIVE / HISTORY OF PRESENT ILLNESS: [name] is a very pleasant 51 y.o.  female with chronic lower back pain, chronic headaches, and myofascial pain. Her pain started in 1999 in her lower back.  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  She had a spinal fusion in 2001 at the L5-S1 level.  She has had bilateral shoulder pain, neck pain, lower back pain, and b/l knees.  She also described a radiculopathy b/l that goes from her back to her bilateral buttocks and down to her toes.      Pt initially established with Dr. Oda [name] in 08/2016 and then was was first evaluated by me in 07/2017. Last follow up with me was 08/03/21.  We scheduled the appointment today, to review progress related to custody of her granddaughter.  The patient reviews stressors related to exacerbation.  Unfortunately she is sick today, with a cough that is worsening, particularly at night (sounding like croup/airway inflammation). Today we reviewed self care strategies and problem solving. She plans to contact her PCP to assess the cough and need for additional medication. She saw urgent care 2 days ago and was given a cough suppressant but the cough is now worse. Currently afebrile. We ended with mindful breathing practice today. 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     OBJECTIVE / MENTAL STATUS:    Appearance:   Appears stated age and Clean/Neat   Motor:  No abnormal movements   Speech/Language:   Normal rate, volume, tone, fluency   Mood:  Euthymic, improved   Affect:  Bright! Mood congruent   Thought process:  Logical, linear, clear, coherent, goal directed   Thought content:    Denies SI, HI, self harm, delusions, obsessions, paranoid ideation, or ideas of reference   Perceptual disturbances:    Denies auditory and visual hallucinations, behavior not concerning for response to internal stimuli   Orientation:  Oriented to person, place, time, and general circumstances   Attention:  Able to fully attend without fluctuations in consciousness   Concentration:  Able to fully concentrate and attend   Memory:  Immediate, short-term, long-term, and recall grossly intact    Fund of knowledge:   Consistent with level of education and development   Insight:    Fair   Judgment:   Intact   Impulse Control:  Intact       DIAGNOSTIC IMPRESSION:   Anxiety NOS  Chronic continuou use of opiates    ASSESSMENT:   [name] is a very pleasant 51 y.o.  female from [place], [place] with chronic lower back  pain, chronic headaches, and myofascial pain. Her pain started in 1999 in her lower back.  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  She had a spinal fusion in 2001 at the L5-S1 level.  She has had bilateral shoulder pain, neck pain, lower back pain, and b/l knees.  She also described a radiculopathy b/l that goes from her back to her bilateral buttocks and down to her toes.  Pt initially met with Dr. Oda [name] on 08/27/2016. Last follow up with Dr. Oda [name] was on 07/14/2017 for Trigger Point Injection of 3+ muscle groups: cervical paraspinal, trapezius, rhomboids, levator scapulae, lumbar paraspinal, thoracic paraspinal, quadratus lumborum to address myofascial pain.    The patient is currently considered to be high risk due to prior nonadherence, but appropriate with a behavioral adherence plan in place.  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    PLAN:   (1) COM - high risk but remains appropriate with a behavioral adherence plan also in place.    -Patient MUST meet with pain psychology/me once a month.   -No additional overuse of opioids will be tolerated.    -Patient should always bring her medication to clinic for pill counts.  -Patient was informed she has had numerous infractions with respect to her pain contract over the years, and that no further infractions will be tolerated.    If any additional pain contract or behavioral adherence contract infractions occur, I recommend stopping opioids.    Pt doing well with adherence, per our last few visits.     (2) Pain coping skills- addressed compliance, as well as pacing and mindful breathing, body scan, and problem solving    (3) follow-up with me next month for a virtual visit

## 2021-08-13 ENCOUNTER — Ambulatory Visit: Admit: 2021-08-13 | Discharge: 2021-08-14 | Payer: MEDICARE

## 2021-08-13 DIAGNOSIS — J320 Chronic maxillary sinusitis: Principal | ICD-10-CM

## 2021-08-13 DIAGNOSIS — J301 Allergic rhinitis due to pollen: Principal | ICD-10-CM

## 2021-08-13 DIAGNOSIS — B379 Candidiasis, unspecified: Principal | ICD-10-CM

## 2021-08-13 DIAGNOSIS — R0789 Other chest pain: Principal | ICD-10-CM

## 2021-08-13 MED ORDER — HYDROCODONE 10 MG-CHLORPHENIRAMINE 8 MG/5 ML ORAL SUSP EXTEND.REL 12HR
Freq: Two times a day (BID) | ORAL | 0 refills | 10 days | Status: CP | PRN
Start: 2021-08-13 — End: 2021-08-23

## 2021-08-13 MED ORDER — FLUCONAZOLE 150 MG TABLET
ORAL_TABLET | 3 refills | 0 days | Status: CP
Start: 2021-08-13 — End: ?

## 2021-08-13 MED ORDER — ALBUTEROL SULFATE HFA 90 MCG/ACTUATION AEROSOL INHALER
RESPIRATORY_TRACT | 0 refills | 0 days | Status: CP | PRN
Start: 2021-08-13 — End: 2021-11-11

## 2021-08-13 MED ORDER — AZITHROMYCIN 250 MG TABLET
ORAL_TABLET | 0 refills | 0 days | Status: CP
Start: 2021-08-13 — End: ?

## 2021-08-13 NOTE — Unmapped (Signed)
Assessment/Plan:      Ingri was seen today for cough.    Diagnoses and all orders for this visit:    Chronic maxillary sinusitis  -     azithromycin (ZITHROMAX Z-PAK) 250 MG tablet; 2 by mouth today then, 1 by mouth daily for 4 days  -     HYDROcodone-chlorpheniramine polistirex (TUSSIONEX PENNKINETIC) 10-8 mg/5 mL ER suspension; Take 5 mL by mouth every twelve (12) hours as needed for cough for up to 10 days.    Chest tightness  -     albuterol HFA 90 mcg/actuation inhaler; Inhale 2 puffs every four (4) hours as needed for wheezing.  -     HYDROcodone-chlorpheniramine polistirex (TUSSIONEX PENNKINETIC) 10-8 mg/5 mL ER suspension; Take 5 mL by mouth every twelve (12) hours as needed for cough for up to 10 days.    Seasonal allergic rhinitis due to pollen  -     albuterol HFA 90 mcg/actuation inhaler; Inhale 2 puffs every four (4) hours as needed for wheezing.    Yeast infection  -     fluconazole (DIFLUCAN) 150 MG tablet; Take one tablet once. May repeat after 3 days as needed.  -     Since antibiotics give a yeast infection, will start on medication above         No follow-ups on file.     Diagnosis and plan along with any newly prescribed medication(s) were discussed in detail with this patient today. The patient verbalized understanding and agreed with the plan without language barriers or behavioral barriers to understanding unless otherwise noted.    Subjective:   HPI: Natalie [name] is a 51 y.o. female is here for    Chief Complaint   Patient presents with    Cough     Pt presents today c/o  cough x 7 days.  Cough is yellowish/greenish.  Has been to urgent care and was given cough syrup.  Admits to sinus congestion, post nasal drip,   Admits to SOB   Denies fever, chills, nausea and vomiting.    Has taken mucinex and theraflu     ROS:     Negative unless noted in hpi    Vital Signs:     Wt Readings from Last 3 Encounters:   08/13/21 71.6 kg (157 lb 12.8 oz)   08/10/21 72.1 kg (159 lb)   08/03/21 72.1 kg (159 lb)     Temp Readings from Last 3 Encounters:   08/13/21 36.7 ??C (98.1 ??F) (Temporal)   07/23/21 36.4 ??C (97.6 ??F)   07/16/21 36.3 ??C (97.3 ??F) (Temporal)     BP Readings from Last 3 Encounters:   08/13/21 110/70   08/10/21 119/79   08/03/21 114/76     Pulse Readings from Last 3 Encounters:   08/13/21 81   08/10/21 93   08/03/21 86     Body mass index is 29.82 kg/m??.    Objective:      Physical Exam  Constitutional:       Appearance: Normal appearance.   HENT:      Head: Normocephalic and atraumatic.      Ears:      Comments: Bilateral TM, mild serous fluid      Nose:      Comments: Nasal mucosa erythematous    Maxillary sinuses tender  Cardiovascular:      Rate and Rhythm: Normal rate and regular rhythm.      Pulses: Normal pulses.  Heart sounds: Normal heart sounds.   Lymphadenopathy:      Cervical: Cervical adenopathy present.   Skin:     General: Skin is warm and dry.   Neurological:      General: No focal deficit present.      Mental Status: She is alert and oriented to person, place, and time.   Psychiatric:         Mood and Affect: Mood normal.         Behavior: Behavior normal.         Labs:     No results found for this visit on 08/13/21.

## 2021-08-13 NOTE — Unmapped (Signed)
Pt says chest feels kind of tight today.

## 2021-08-19 NOTE — Unmapped (Signed)
Message received from pharmacy that patients insurance doesn't cover HYDROcodone-chlorpheniramine polistirex.     This medication is unable to be searched and started on cover my meds. Chart to [facility] to send alternate option to the pharmacy for patient.

## 2021-08-25 NOTE — Unmapped (Signed)
Unable to leave message on voice mail.    Procedure scheduled with Dr. Fayrene Fearing for Thurs 8/3 @ 0930am.  Please arrive with a driver @ [id number].  Please stop any NSAIDS or blood thinners until after the procedure.  If you've been on antibiotics in the last 2 weeks, please call [phone] to reschedule.

## 2021-08-25 NOTE — Unmapped (Signed)
Pre-procedure call made and RN spoke with patient.    Denies recent infection/antibiotics.   Denies being diabetic.  Denies taking any recent blood thinners/NSAIDs.   Denies any electronic implants  Denies pregnancy    Driver necessary    Patient informed to arrive 30 minutes before procedure appointment time.  Patient verbalized understanding to all.

## 2021-08-26 NOTE — Unmapped (Signed)
[facility] Administered Medication Refill Coordination Note      NAME:Natalie [name] DOB: April 02, 1970      Medication: Xolair    Day Supply: 28 days      SHIPPING      Next delivery from [facility] [phone]) to  [facility] Natalie [name]  for Natalie [name] is scheduled for 08/09.    Clinic contact: [name]    Patient's next nurse visit for administration: unsure.    We will follow up with clinic monthly for standard refill processing and delivery.      Natalie [name]  Specialty Pharmacy Technician

## 2021-08-27 ENCOUNTER — Ambulatory Visit: Admit: 2021-08-27 | Discharge: 2021-08-28 | Payer: MEDICARE | Attending: Anesthesiology | Primary: Anesthesiology

## 2021-08-27 DIAGNOSIS — M5417 Radiculopathy, lumbosacral region: Principal | ICD-10-CM

## 2021-08-27 NOTE — Unmapped (Addendum)
PROCEDURE: Caudal Epidural Steroid Injection Under Fluoroscopic Guidance    PRE-PROCEDURE DIAGNOSIS: Lumbosacral radiculopathy  POST-PROCEDURE DIAGNOSIS: same  PROCEDURE PERFORMED BY: Dr. Criss Rosales  ASSISTANT: Dr. Jerrell Mylar  Anesthesia: Local      The patient is a 51 y.o.  female with past medical history of pseudotumor cerebri, bilateral knee osteoarthritis, BMI of 32, myofascial pain syndrome, L5-S1 spinal fusion in 2001, coccydynia status post caudal ESI.     8/3-Last caudal ESI 08/21/20 with 85% relief for 10 months. She returns today for repeat.      Pre-procedure motor function  5/5 in lower extremities    Informed consent was obtained and potential risks discussed including, but not limited to: bleeding, bruising, severe allergic reaction to components of the injection materials, infection, nerve damage, postdural puncture headache, the possibility of no benefit (pain relief) derived from the injection, or in rare occasions worsening of pain. Questions were answered to the patient's satisfaction and the patient wishes to proceed.  Alternative options for treatment have previously been discussed and explored with the patient. The patient is not taking antiplatelet or anticoagulation medications and does  have a driver today.    DESCRIPTION OF PROCEDURE:     The patient was identified and taken to the fluoroscopy room where She was placed in a prone position. All pressure points were padded appropriately. A timeout was performed and the correct location was confirmed with the patient.  The patient's heart rate, blood pressure, and oxygen  saturation were continuously monitored throughout the procedure. The lumbosacral region was sterilely prepped with chlorhexidine and sterile drape. The physician wore a mask, hat and sterile gloves.    The sacral hiatus was identified under lateral fluoroscopic guidance. The skin and subcutaneous tissues overlying this area were topicalized with 1% lidocaine.  Then a 17gauge Tuohy needle was advanced through the sacral hiatus under fluoroscopic guidance. In the AP view, a 19 gauge epidural catheter was threaded under fluoroscopic guidance to the L5/S1 level.  After a negative aspiration of blood or CSF, Omnipaque dye was injected and demonstrated appropriate epidural spread. Next, 40mg  of 40 mg/cc of kenalog along with 2 ml of 1.5% lidocaine with 1:200,000 epi was injected, and then 6 ml of preservative-free normal saline for a total volume of 9 ml was injected slowly and incrementally.The needle and catheter were removed as a unit, intact.  A sterile dressing was applied.      The patient did tolerate the procedure well and there were no apparent complications. All injection sites were sterilely dressed. The patient was discharged after an appropriate period of observation and post procedural education was given.    Total fluoroscopic time 23 seconds.  Pre-procedure pain score was 7/10  Post-procedure pain score is 7/10    Post-procedure motor function  5/5 in lower extremities.     DISPO:  Has follow up in September for a clinic visit.    Requested Prescriptions      No prescriptions requested or ordered in this encounter     No orders of the defined types were placed in this encounter.    Answers submitted by the patient for this visit:  Back Pain Questionnaire (Submitted on 08/27/2021)  Chief Complaint: Back pain  Chronicity: chronic  Onset: more than 1 year ago  Frequency: constantly  Progression since onset: waxing and waning  Pain location: gluteal, lumbar spine, sacro-iliac, thoracic spine  Pain quality: aching, burning, cramping, shooting, stabbing  Radiates to: left foot, left knee, left  thigh, right foot, right knee  Pain - numeric: 9/10  Pain is: the same all the time  Aggravated by: bending, coughing, position, lying down, sitting, standing, twisting  Stiffness is present: all day  abdominal pain: No  bladder incontinence: No  bowel incontinence: No  chest pain: No  dysuria: No  fever: No  headaches: Yes  leg pain: Yes  numbness: Yes  paresis: No  paresthesias: Yes  pelvic pain: No  perianal numbness: No  tingling: Yes  weakness: No  weight loss: No  Risk factors: menopause, poor posture      I saw and evaluated the patient with the fellow.  I was scrubbed and participated with the entire procedure.  Criss Rosales, MD

## 2021-08-27 NOTE — Unmapped (Signed)
POST PROCEDURE INSTRUCTIONS   You may apply an ice pack 20-30 minutes at a time to the injection site if you experience soreness.     Keep the injection site clean and dry. You make remove the band-aid one day following the procedure.     You may take a shower but AVOID getting in to baths, pools or whirlpools for 48 HOURS AFTER THE PROCEDURE.     Remember that it takes a few days, even up to a week for the steroid medicine to reduce inflammation and pain. If you are diabetic, the steroid medicine may cause your blood sugar levels to increase. Please contact your internist regarding medication adjustments.    ACTIVITY   Refrain from heavy activity for the next 24 to 48 hours. General walking is okay. You may resume your normal activities the day following the procedure.   You may start or resume your individualized exercise program or physical therapy 48 hours after the procedure.   MEDICATIONS   Please note that it is okay to continue other prescribed medications (blood pressure, insulin, water pill, depression/anxiety pill, etc.) as well as other prescribed pain medications such as Neurontin, Lyrical, Celebrex, Ultram, Vicodin, Norco and acetaminophen (Tylenol).   SIDE EFFECTS   Increase in pain during the first 24 to 48 hours.     Trouble sleeping during the first one or two nights after the procedure-this is an effect of the steroid medicine.     Facial flushing (it feels like you have a fever, but you do not) - this is an effect of the steroid medicine.     You might experience:   1. Mild to moderate swelling at the joint.   2. Possible bruising at the injection site.     WHEN TO CALL THE DOCTOR/NURSE   Severe pain, worse or different that the pain you had before the procedure.     Fever or chills.     Redness, or swelling around the injection site.     Call the Pain Management  Procedural Nurses [phone] during normal business hours (7 am-3 pm). If it is AFTER HOURS or during a weekend or holiday, call the hospital operator and ask for the Anesthesia Pain physician on call at [phone].   FOR EMERGENCIES, CALL 911 OR GO TO THE NEAREST HOSPITAL EMERGENCY DEPARTMENT.   ?   TO SCHEDULE APPOINTMENTS OR FOR QUESTIONS RELATED TO MEDICATIONS   Call the Pain Management Clinic at [phone]

## 2021-08-27 NOTE — Unmapped (Signed)
AVS reviewed with patient and she verbalized understanding to all.  Patient states she has a driver.

## 2021-08-31 NOTE — Unmapped (Signed)
Pre-procedure call made and RN spoke with patient    Recent infection/antibiotics. No    Are you diabetic? No Monitor Blood Glucose Daily? N/A    Taking any recent blood thinners/NSAIDs. No    Any electronic implants No    Pregnancy N/A    Driver necessary No    Patient informed to arrive 30 minutes before procedure appointment time.  Patient verbalized understanding to all.

## 2021-09-01 ENCOUNTER — Ambulatory Visit: Admit: 2021-09-01 | Discharge: 2021-09-02 | Payer: MEDICARE | Attending: Anesthesiology | Primary: Anesthesiology

## 2021-09-01 DIAGNOSIS — M7918 Myalgia, other site: Principal | ICD-10-CM

## 2021-09-01 MED ADMIN — BUPivacaine HCl (MARCAINE) 0.25 % (2.5 mg/mL) injection 75 mg: 30 mL | @ 14:00:00 | Stop: 2021-09-01

## 2021-09-01 MED FILL — XOLAIR 150 MG/ML SUBCUTANEOUS SYRINGE: SUBCUTANEOUS | 28 days supply | Qty: 2 | Fill #3

## 2021-09-01 NOTE — Unmapped (Signed)
POST PROCEDURE INSTRUCTIONS   You may apply an ice pack 20-30 minutes at a time to the injection site if you experience soreness.     Keep the injection site clean and dry. You make remove the band-aid one day following the procedure.     You may take a shower but AVOID getting in to baths, pools or whirlpools for 48 HOURS AFTER THE PROCEDURE.       ACTIVITY   Refrain from heavy activity for the next 24 to 48 hours. General walking is okay. You may resume your normal activities the day following the procedure.   You may start or resume your individualized exercise program or physical therapy 48 hours after the procedure.   MEDICATIONS   Please note that it is okay to continue other prescribed medications (blood pressure, insulin, water pill, depression/anxiety pill, etc.) as well as other prescribed pain medications such as Neurontin, Lyrical, Celebrex, Ultram, Vicodin, Norco and acetaminophen (Tylenol).   SIDE EFFECTS   Increase in pain during the first 24 to 48 hours.     You might experience:   1. Mild to moderate swelling at the joint.   2. Possible bruising at the injection site.     WHEN TO CALL THE DOCTOR/NURSE   Severe pain, worse or different that the pain you had before the procedure.     Fever or chills.     Redness, or swelling around the injection site.     Call the Pain Management  Procedural nurses [phone],  during normal business hours (7 am-3 pm). If it is AFTER HOURS or during a weekend or holiday, call the hospital operator and ask for the Anesthesia Pain physician on call at [phone].   FOR EMERGENCIES, CALL 911 OR GO TO THE NEAREST HOSPITAL EMERGENCY DEPARTMENT.   ?   TO SCHEDULE APPOINTMENTS OR FOR QUESTIONS RELATED TO MEDICATIONS   Call the Pain Management Clinic at [phone]

## 2021-09-01 NOTE — Unmapped (Signed)
AVS reviewed with patient and she verbalized understanding to all.

## 2021-09-01 NOTE — Unmapped (Addendum)
Trigger Point Injection of 3+ muscle groups: Bilateral cervical, thoracic and lumbar paraspinals, bilateral trapezius, rhomboids, and levator scapulae, bilateral quadratus and gluteus medius  Pre-operative diagnosis: Myofascial pain  Post-operative diagnosis: Same  Attending Physician: Dr. Criss Rosales  Assistant: Dr. Marcelle Smiling     Brief HPI:  The patient is a 51 y.o. female with past medical history of pseudotumor cerebri, bilateral knee osteoarthritis, myofascial pain syndrome, L5-S1 spinal fusion in 2001, coccydynia. She presents today for treatment of her myofascial paracervical, shoulders, upper mid and lower back areas with repeat trigger point injections      8/8-Last TPIs were 6/22, she endorsed good relief for 6 weeks.   Most recent procedure was caudal ESI on 08/27/2021.  She states that she has had relief with her pain in the buttocks and shooting pain down her legs.      Has serial consent from 07/16/21     After risks and benefits were explained including bleeding, infection, worsening of the pain, damage to the area being injected, weakness, allergic reaction to medications, vascular injection, pneumothorax and nerve damage, signed consent was obtained.  All questions were answered.   The patient is not taking antiplatelet or anticoagulation medications and does not have a driver today.     The area of the trigger point was identified and the skin prepped with chlorhexidine.  Next, a 25 gauge 1.5 inch needle was placed in the area of the trigger points in the lumbar paraspinals and the gluteus medius; a 27 gauge 0.5 inch needle was used for everything else above.  Dry needling was performed in the area of the trigger point.  Once reproduction of the pain was elicited and negative aspiration confirmed, the trigger point was injected with 0.5-1 mL of 0.25% bupivacaine and the needle removed.      The patient did tolerate the procedure well and there were not complications.       Trigger points injected: 30     Trigger point locations:  bilateral cervical, thoracic and lumbar paraspinals, bilateral trapezius, rhomboids, and levator scapulae, bilateral quadratus, and gluteus medius     The patient was monitored for 15 minutes after the procedure.  Vital signs remained normal and the patient ambulated out of clinic     Pre-procedure pain score: 7/10  Post-procedure pain score: 3/10     DISPO:     - Repeat TPI ordered for 6-8 weeks  - Clinic appointment in September         Answers submitted by the patient for this visit:  Back Pain Questionnaire (Submitted on 08/28/2021)  Chief Complaint: Back pain  Chronicity: chronic  Onset: more than 1 year ago  Frequency: constantly  Progression since onset: gradually worsening  Pain location: gluteal, lumbar spine, sacro-iliac, thoracic spine  Pain quality: aching, burning, cramping, shooting, stabbing  Radiates to: left foot, left knee, left thigh, right foot, right knee, right thigh  Pain - numeric: 9/10  Pain is: the same all the time  Aggravated by: bending, coughing, position, lying down, sitting, standing, twisting  Stiffness is present: all day  abdominal pain: No  bladder incontinence: No  bowel incontinence: No  chest pain: No  dysuria: No  fever: No  headaches: Yes  leg pain: Yes  numbness: Yes  paresis: No  paresthesias: Yes  pelvic pain: No  perianal numbness: No  tingling: Yes  weakness: Yes  weight loss: No  Risk factors: menopause, poor posture

## 2021-09-08 MED ORDER — OXYCODONE-ACETAMINOPHEN 10 MG-325 MG TABLET
ORAL_TABLET | Freq: Four times a day (QID) | ORAL | 0 refills | 30 days | Status: CP | PRN
Start: 2021-09-08 — End: ?

## 2021-09-08 NOTE — Unmapped (Signed)
appt
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
## 2021-09-21 ENCOUNTER — Ambulatory Visit: Admit: 2021-09-21 | Payer: MEDICARE | Attending: Sports Medicine | Primary: Sports Medicine

## 2021-09-23 DIAGNOSIS — N951 Menopausal and female climacteric states: Principal | ICD-10-CM

## 2021-09-23 DIAGNOSIS — G44221 Chronic tension-type headache, intractable: Principal | ICD-10-CM

## 2021-09-23 DIAGNOSIS — M1712 Unilateral primary osteoarthritis, left knee: Principal | ICD-10-CM

## 2021-09-23 DIAGNOSIS — M5417 Radiculopathy, lumbosacral region: Principal | ICD-10-CM

## 2021-09-23 DIAGNOSIS — M7061 Trochanteric bursitis, right hip: Principal | ICD-10-CM

## 2021-09-23 DIAGNOSIS — G43019 Migraine without aura, intractable, without status migrainosus: Principal | ICD-10-CM

## 2021-09-23 DIAGNOSIS — M1711 Unilateral primary osteoarthritis, right knee: Principal | ICD-10-CM

## 2021-09-23 DIAGNOSIS — R600 Localized edema: Principal | ICD-10-CM

## 2021-09-23 DIAGNOSIS — M7918 Myalgia, other site: Principal | ICD-10-CM

## 2021-09-23 MED ORDER — GABAPENTIN 800 MG TABLET
ORAL_TABLET | Freq: Two times a day (BID) | ORAL | 0 refills | 30 days | Status: CP
Start: 2021-09-23 — End: ?

## 2021-09-23 MED ORDER — HYDROCHLOROTHIAZIDE 12.5 MG TABLET
ORAL_TABLET | 0 refills | 0 days | Status: CP
Start: 2021-09-23 — End: ?

## 2021-09-23 MED ORDER — ESTRADIOL 0.5 MG TABLET
ORAL_TABLET | Freq: Every day | ORAL | 0 refills | 0.00000 days | Status: CP
Start: 2021-09-23 — End: ?

## 2021-09-23 MED ORDER — DICLOFENAC 20 MG/GRAM/ACTUATION (2 %) TOPICAL SOLN METERED-DOSE PUMP
2 refills | 0 days | Status: CP
Start: 2021-09-23 — End: ?
  Filled 2021-12-21: qty 112, 30d supply, fill #0

## 2021-09-23 MED ORDER — NORTRIPTYLINE 10 MG CAPSULE
ORAL_CAPSULE | 1 refills | 0 days
Start: 2021-09-23 — End: ?

## 2021-09-23 NOTE — Unmapped (Signed)
Refill request received for patient.      Medication Requested: Gabapentin   Last Office Visit: 07/06/2021   Next Office Visit: 10/09/2021  Last Prescriber: Manson Passey [facility])     Please refill if appropriate

## 2021-09-23 NOTE — Unmapped (Signed)
Electronic request for gabapentin.  Filled with no refills as patient has appointment in September 2023.    Requested Prescriptions     Pending Prescriptions Disp Refills    gabapentin (NEURONTIN) 800 MG tablet 60 tablet 2

## 2021-09-23 NOTE — Unmapped (Signed)
Patient is requesting the following refill  Requested Prescriptions     Pending Prescriptions Disp Refills    estradioL (ESTRACE) 0.5 MG tablet 90 tablet 0       Last OV: 08/13/2021    Next OV: Visit date not found.     Labs: Not applicable this refill

## 2021-09-23 NOTE — Unmapped (Signed)
Patient is requesting the following refill  Requested Prescriptions     Pending Prescriptions Disp Refills    hydroCHLOROthiazide (HYDRODIURIL) 12.5 MG tablet [Pharmacy Med Name: HYDROCHLOROTHIAZIDE 12.5MG  TABLETS] 90 tablet 1     Sig: TAKE 1 TABLET(12.5 MG) BY MOUTH DAILY AS NEEDED FOR SWELLING    estradioL (ESTRACE) 0.5 MG tablet [Pharmacy Med Name: ESTRADIOL 0.5MG  TABLETS] 90 tablet 0     Sig: TAKE 1 TABLET(0.5 MG) BY MOUTH DAILY       Last OV: 08/13/2021    Next OV: Visit date not found.     Labs: Creatinine:   Creatinine, Serum (mg/dL)   Date Value   57/84/6962 0.80     Creatinine   Date Value   02/16/2021 0.63 mg/dL   95/28/4132 0.8 mg/dl    Potassium:   Potassium, Serum (mEq/L)   Date Value   12/19/2012 4.1     Potassium (mmol/L)   Date Value   02/16/2021 3.3 (L)   01/02/2015 3.9    Sodium:   Sodium (mmol/L)   Date Value   02/16/2021 143   01/02/2015 141    Vitals:   BP Readings from Last 3 Encounters:   09/01/21 126/83   08/27/21 144/82   08/13/21 110/70    and   Pulse Readings from Last 3 Encounters:   09/01/21 83   08/27/21 78   08/13/21 81     Patient has not completed labs. Please advise

## 2021-09-24 NOTE — Unmapped (Signed)
[facility] Administered Medication Refill Coordination Note      NAME:Natalie [name] DOB: [birth year]      Medication: Xolair    Day Supply: 28 days      SHIPPING      Next delivery from [facility] [phone]) to  [facility] Natalie [name]  for Sharla Kidney [place] is scheduled for n/a.    Clinic contact: [name]    Patient's next nurse visit for administration: n/a -clinic denied delivery @ this time advised will follow-up in 3 weeks.    We will follow up with clinic monthly for standard refill processing and delivery.      Rayquon Uselman Samella Parr  Specialty Pharmacy Technician

## 2021-09-24 NOTE — Unmapped (Signed)
Dear [name]    Our headache sub-specialist has retired.     At this time, we do not have a headache sub-specialist in our clinic.     We are transferring Dr.Jay's patients to our General Neurology Providers.     Patients are welcome to see [place] and must follow up with our team if we are to continue providing refills.     If patient prefers a headache subspecialist, in no order of preference, here's a list of headache subspecialty centers in the community:    [facility] in [facility] in [facility]  Headache Program at [facility] in [place]  Headache team at [facility] in [place].        Regards,     Donald Pore L. Andreas Newport, MD  Associate Professor of Neurology  Medical Director, [facility], [facility]

## 2021-09-25 NOTE — Unmapped (Signed)
Attempted to contact patient to discuss. Left message requesting a call back. No PHI left on message.

## 2021-09-29 DIAGNOSIS — R0789 Other chest pain: Principal | ICD-10-CM

## 2021-09-29 DIAGNOSIS — J3089 Other allergic rhinitis: Principal | ICD-10-CM

## 2021-09-29 DIAGNOSIS — J301 Allergic rhinitis due to pollen: Principal | ICD-10-CM

## 2021-09-29 MED ORDER — XHANCE 93 MCG/ACTUATION BREATH ACTIVATED AEROSOL
Freq: Two times a day (BID) | NASAL | 11 refills | 0 days | Status: CP | PRN
Start: 2021-09-29 — End: ?

## 2021-09-29 MED ORDER — ALBUTEROL SULFATE HFA 90 MCG/ACTUATION AEROSOL INHALER
RESPIRATORY_TRACT | 0 refills | 0 days | Status: CP | PRN
Start: 2021-09-29 — End: 2021-12-28

## 2021-09-29 NOTE — Unmapped (Signed)
Patient is requesting the following refill  Requested Prescriptions     Pending Prescriptions Disp Refills    albuterol HFA 90 mcg/actuation inhaler 54 g 0     Sig: Inhale 2 puffs every four (4) hours as needed for wheezing.       Recent Visits  Date Type Provider Dept   08/13/21 Office Visit Kaddijatou Henriette Combs, PA [place] [facility]   07/23/21 Office Visit Venkat Rama Dallas Breeding, MD [place] [facility]   06/08/21 Office Visit Venkat Rama Dallas Breeding, MD [place] [facility]   05/05/21 Telemedicine Venkat Rama Dallas Breeding, MD [place] [facility]   04/15/21 Telemedicine Venkat Rama Dallas Breeding, MD [place] [facility]   04/13/21 Office Visit Venkat Rama Dallas Breeding, MD [place] [facility]   04/01/21 Office Visit Venkat Rama Dallas Breeding, MD [place] [facility]   03/02/21 Office Visit Venkat Rama Dallas Breeding, MD [place] [facility]   02/12/21 Office Visit Venkat Rama Dallas Breeding, MD [place] [facility]   12/23/20 Office Visit Venkat Rama Dallas Breeding, MD  [facility]   Showing recent visits within past 365 days with a meds authorizing provider and meeting all other requirements  Future Appointments  No visits were found meeting these conditions.  Showing future appointments within next 365 days with a meds authorizing provider and meeting all other requirements       Labs: Not applicable this refill

## 2021-09-29 NOTE — Unmapped (Signed)
Unable to complete refill request, medication is not included in the refill protocol. .   Please advise on how to proceed.  Patient is requesting the following refill  Requested Prescriptions     Pending Prescriptions Disp Refills    XHANCE 93 mcg/actuation AerB 16 mL 6     Sig: 1 spray into each nostril two (2) times a day as needed.       Recent Visits  Date Type Provider Dept   08/13/21 Office Visit Kaddijatou Henriette Combs, PA [place] [facility]   07/23/21 Office Visit Venkat Rama Dallas Breeding, MD [place] [facility]   06/08/21 Office Visit Venkat Rama Dallas Breeding, MD [place] [facility]   05/05/21 Telemedicine Venkat Rama Dallas Breeding, MD [place] [facility]   04/15/21 Telemedicine Venkat Rama Dallas Breeding, MD [place] [facility]   04/13/21 Office Visit Venkat Rama Dallas Breeding, MD [place] [facility]   04/01/21 Office Visit Venkat Rama Dallas Breeding, MD [place] [facility]   03/02/21 Office Visit Venkat Rama Dallas Breeding, MD [place] [facility]   02/12/21 Office Visit Venkat Rama Dallas Breeding, MD [place] [facility]   12/23/20 Office Visit Venkat Rama Dallas Breeding, MD  [facility] At [facility]   Showing recent visits within past 365 days with a meds authorizing provider and meeting all other requirements  Future Appointments  No visits were found meeting these conditions.  Showing future appointments within next 365 days with a meds authorizing provider and meeting all other requirements       Labs: Not applicable this refill

## 2021-09-30 MED ORDER — NORTRIPTYLINE 10 MG CAPSULE
ORAL_CAPSULE | 1 refills | 0 days
Start: 2021-09-30 — End: ?

## 2021-10-01 ENCOUNTER — Ambulatory Visit: Admit: 2021-10-01 | Discharge: 2021-10-02 | Payer: MEDICARE | Attending: Sports Medicine | Primary: Sports Medicine

## 2021-10-01 DIAGNOSIS — G8929 Other chronic pain: Principal | ICD-10-CM

## 2021-10-01 DIAGNOSIS — M25562 Pain in left knee: Principal | ICD-10-CM

## 2021-10-01 DIAGNOSIS — S83232A Complex tear of medial meniscus, current injury, left knee, initial encounter: Principal | ICD-10-CM

## 2021-10-01 NOTE — Unmapped (Signed)
PATIENT: Natalie [name]      AGE: 51 y.o.     DOB: 1971/01/07    Medical Record Number:  [id number]            Date of Exam:  10/01/2021      Primary Care Provider:  Bernita Raisin, MD       Chief Complaint     Chief Complaint   Patient presents with    Left Knee - Follow-up    Right Knee - Follow-up        History Of Present Illness   Natalie [name] is a  51 y.o.  female who presents today for evaluation of left knee.  She has been complaining of pain in her knee for the past several years.  She has managed this with viscosupplementation as well as corticosteroid injections.  She underwent her last Visco supplementation nearly a year ago.  It was not quite as effective but she did get relief.  Her prior injections lasted about 2 years.  She complains of pain and stiffness in the knee.  The pain is achy and dull but can become sharp at times.  She is felt some discomfort going up and down stairs as well as pivoting.  She has used naproxen as well as Percocet at times to manage the symptoms.    Today's History Update 07/02/2021 - Patient presents for MRI results review of left knee.  She continues to complain of pain medially in the left knee.  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    Today's History Update 10/01/2021 - Patient presents for follow up of bilateral knees.  Her right knee is improved after the viscosupplementation however her left knee continues to be symptomatic.  She has been complaining of ongoing pain over the past 4 months or so in spite of corticosteroid injection as well as viscosupplementation.  She has noticed pain throughout the knee but notices specifically medially when she pivots on the knee and sits crosslegged.  Her prior MRI showed a tear of the medial meniscus as well as degeneration in the medial compartment  Home Medications     Current Outpatient Medications:     albuterol HFA 90 mcg/actuation inhaler, Inhale 2 puffs every four (4) hours as needed for wheezing., Disp: 25.5 g, Rfl: 0    azelastine (ASTELIN) 137 mcg (0.1 %) nasal spray, 2 sprays into each nostril Two (2) times a day., Disp: 30 mL, Rfl: 12    azelastine (OPTIVAR) 0.05 % ophthalmic solution, Administer 2 drops to both eyes daily as needed., Disp: 6 mL, Rfl: 5    azithromycin (ZITHROMAX Z-PAK) 250 MG tablet, 2 by mouth today then, 1 by mouth daily for 4 days, Disp: 6 tablet, Rfl: 0    baclofen (LIORESAL) 20 MG tablet, 10 - 20 mg three times a day as needed for spasms., Disp: 90 tablet, Rfl: 2    cetirizine (ZYRTEC) 10 MG tablet, , Disp: , Rfl:     diclofenac sodium (PENNSAID) 20 mg/gram /actuation(2 %) sopm, Apply 2 pumps to affected area up to four times a day., Disp: 112 g, Rfl: 2    estradioL (ESTRACE) 0.5 MG tablet, Take 1 tablet (0.5 mg total) by mouth daily., Disp: 90 tablet, Rfl: 0    fluconazole (DIFLUCAN) 150 MG tablet, Take one tablet once. May repeat after 3 days as needed., Disp: 3 tablet, Rfl: 3    gabapentin (NEURONTIN) 800 MG tablet, Take 1 tablet (  800 mg total) by mouth Two (2) times a day., Disp: 60 tablet, Rfl: 0    hydroCHLOROthiazide (HYDRODIURIL) 12.5 MG tablet, TAKE 1 TABLET(12.5 MG) BY MOUTH DAILY AS NEEDED FOR SWELLING, Disp: 30 tablet, Rfl: 0    naloxone (NARCAN) 4 mg nasal spray, One spray in either nostril once for known/suspected opioid overdose. May repeat every 2-3 minutes in alternating nostril til EMS arrives, Disp: 1 each, Rfl: 0    nortriptyline (PAMELOR) 25 MG capsule, TAKE 1 CAPSULE(25 MG) BY MOUTH EVERY NIGHT, Disp: 90 capsule, Rfl: 1    olopatadine (PATANOL) 0.1 % ophthalmic solution, Administer 1 drop to both eyes Two (2) times a day., Disp: 5 mL, Rfl: 1    omalizumab (XOLAIR) 150 mg/mL syringe, Inject the contents of 2 syringes (300 mg total) under the skin every twenty-eight (28) days., Disp: 2 mL, Rfl: 12    oxyCODONE-acetaminophen (PERCOCET) 10-325 mg per tablet, Take 1 tablet by mouth every six (6) hours as needed for pain. Max 4 tabs/day. Fill on or after: 07/10/21. Brand name only due to allergy., Disp: 120 tablet, Rfl: 0    oxyCODONE-acetaminophen (PERCOCET) 10-325 mg per tablet, Take 1 tablet by mouth every six (6) hours as needed for pain. Max 4 tabs/day. Fill on or after: 08/09/21 Brand name only due to allergy., Disp: 120 tablet, Rfl: 0    oxyCODONE-acetaminophen (PERCOCET) 10-325 mg per tablet, Take 1 tablet by mouth every six (6) hours as needed for pain. Max 4 tabs/day. Fill on or after: 09/08/21. Brand name only due to allergy., Disp: 120 tablet, Rfl: 0    pantoprazole (PROTONIX) 40 MG tablet, TAKE 1 TABLET(40 MG) BY MOUTH DAILY, Disp: 30 tablet, Rfl: 4    potassium chloride 20 MEQ CR tablet, Take 1 tablet (20 mEq total) by mouth daily., Disp: 90 tablet, Rfl: 2    PROCHAMBER Spcr, , Disp: , Rfl:     rizatriptan (MAXALT-MLT) 10 MG disintegrating tablet, Take 1 tablet by mouth at ONSET of migraine or aura. May repeat in 2 HOURS if needed. DO NOT EXCEED 2 doses/day, 2 days/week, 8 doses/month., Disp: 10 tablet, Rfl: 5    rosuvastatin (CRESTOR) 10 MG tablet, TAKE 1 TABLET(10 MG) BY MOUTH EVERY NIGHT, Disp: 90 tablet, Rfl: 1    triamcinolone (KENALOG) 0.1 % cream, Apply 1 application topically two (2) times a day as needed. WHEN ALLERGIES FLARE-UP AND CAUSES RASH / ITCHING, Disp: 80 g, Rfl: 0    XHANCE 93 mcg/actuation AerB, 1 spray into each nostril two (2) times a day as needed., Disp: 16 mL, Rfl: 11    EPINEPHrine (EPIPEN) 0.3 mg/0.3 mL injection, Inject 0.3 mL (0.3 mg total) into the muscle once for 1 dose., Disp: 2 each, Rfl: 0     Allergies   Buprenorphine hcl, Pregabalin, Adhesive tape-silicones, Doxycycline hyclate (bulk), Keflex [cephalexin], Opioids - morphine analogues, and Oxycodone-acetaminophen    Medical History     Past Medical History:   Diagnosis Date    Allergic conjunctivitis 11/23/2018    Anemia 1986    Arthritis     Breast mass     Chondromalacia of left knee     Chondromalacia of right knee Depressive disorder     Fibrocystic breast     GERD (gastroesophageal reflux disease)     Hematuria     History of kidney stones     History of transfusion     Hyperlipidemia 1999    Hypothyroidism     Lumbar disc disease 1998  She was injured at work at the age of 43 and then had disk surgery 2 years later     Menopause ovarian failure     Menopause, premature     Migraine with aura     Neuromuscular disorder (CMS-HCC) 1999    Obesity (BMI 30-39.9) 11/23/2018    Ovarian cyst     Plantar fasciitis     Pseudotumor cerebri     Rash due to allergy 11/23/2018    Sickle cell trait (CMS-HCC)     Urinary tract infection     Vaginitis        Surgical History     Past Surgical History:   Procedure Laterality Date    BACK SURGERY  2001    BREAST BIOPSY Bilateral     When patient was 16 & 17 Both Negative    HYSTERECTOMY  2008    PR REMOVAL OF TONSILS,13+ Y/O Bilateral 10/10/2019    Procedure: TONSILLECTOMY;  Surgeon: Lona Millard, MD;  Location: OR [place];  Service: ENT    SALPINGOOPHORECTOMY Bilateral 2007    SPINE SURGERY  2001    TOTAL VAGINAL HYSTERECTOMY  01/04/2006    Uterine Prolapse-Dr. Leeanne Rio OP Note    TUBAL LIGATION  1995        Social History     Past Medical History:   Diagnosis Date    Allergic conjunctivitis 11/23/2018    Anemia 1986    Arthritis     Breast mass     Chondromalacia of left knee     Chondromalacia of right knee     Depressive disorder     Fibrocystic breast     GERD (gastroesophageal reflux disease)     Hematuria     History of kidney stones     History of transfusion     Hyperlipidemia 1999    Hypothyroidism     Lumbar disc disease 1998    She was injured at work at the age of 54 and then had disk surgery 2 years later     Menopause ovarian failure     Menopause, premature     Migraine with aura     Neuromuscular disorder (CMS-HCC) 1999    Obesity (BMI 30-39.9) 11/23/2018    Ovarian cyst     Plantar fasciitis     Pseudotumor cerebri     Rash due to allergy 11/23/2018 Sickle cell trait (CMS-HCC)     Urinary tract infection     Vaginitis         Family History     Family History   Problem Relation Age of Onset    Heart attack Father     Mental illness Father     Asthma Father     Hypertension Mother     Heart disease Mother     Arthritis Mother     Depression Mother     Drug abuse Mother     Mental illness Mother     Ulcers Mother     COPD Mother     Heart attack Maternal Grandmother     Heart disease Maternal Grandmother     Hypertension Maternal Grandmother     Thyroid disease Maternal Grandmother     Breast cancer Cousin 8    Cancer Sister         lymphoma    COPD Maternal Aunt     Depression Son     Drug abuse Son     Mental illness Son  Stroke Paternal Grandfather     Thyroid disease Other     Alcohol abuse Neg Hx      Family Status   Relation Name Status    Father DJ Deceased    Mother Particia Nearing    [facility] Dennie Bible (Not Specified)    Cousin  Deceased    Sister Marvis Repress (Not Specified)    Mat Aunt Amada Jupiter Deceased    Son Cloretta Ned (Not Specified)    PGF HV (Not Specified)    Other  (Not Specified)    Neg Hx  (Not Specified)       Physical Exam          10/01/21 0937   BP: 137/88   Pulse: 62   Weight: 68.9 kg (152 lb)   Height: 154.9 cm (5' 1)        Appearance: Well nourished/well developed.  Psychiatric: Mood and affect appropriate.    Head:  Normocephalic, atraumatic.   Skin:  Clean, dry, no lesions, no rashes.  Neuro:  Alert and oriented.  Respiratory: Unlabored.       FOCUSED EXAM:  Knee Exam:  ROM          Extension: 0          Flexion: 130  Effusion:           None   Palpation            Tenderness medial joint line, lateral patellar facet  Ligament Testing:          Lachman's: Negative           Posterior Drawer: Negative           Collateral ligamentous instability: Negative   Meniscus Testing:          McMurray's: Positive     Patella Testing:          Patellar crepitus: Mild   Pain with patella grind          Patellar Tracking: Normal           Patellar Mobility: Normal Hip:           Normal   Other Findings: Normal gait today.                                  Imaging       MRI of the left knee was independently reviewed today.  The MRI showed significant area of grade 4 chondral loss involving the posterior aspect of the medial femoral condyle.  There was significant subchondral cystic changes and bone marrow edema.  There was an increase signal within the undersurface of the medial meniscus with subtle undersurface tear.  Articular cartilage on the tibial plateau medially was intact.  Patellofemoral articular cartilage was preserved as was the lateral articular compartment cartilage.      Assessment & Plan     Diagnosis:   No diagnosis found.          Prescription:      Your Medication List            Accurate as of October 01, 2021  9:43 AM. If you have any questions, ask your nurse or doctor.                CONTINUE taking these medications      albuterol 90 mcg/actuation inhaler  Commonly known as: PROVENTIL HFA;VENTOLIN HFA  Inhale 2 puffs  every four (4) hours as needed for wheezing.     azelastine 137 mcg (0.1 %) nasal spray  Commonly known as: ASTELIN  2 sprays into each nostril Two (2) times a day.     azelastine 0.05 % ophthalmic solution  Commonly known as: OPTIVAR  Administer 2 drops to both eyes daily as needed.     azithromycin 250 MG tablet  Commonly known as: ZITHROMAX Z-PAK  2 by mouth today then, 1 by mouth daily for 4 days     baclofen 20 MG tablet  Commonly known as: LIORESAL  10 - 20 mg three times a day as needed for spasms.     cetirizine 10 MG tablet  Commonly known as: ZyrTEC     diclofenac sodium 20 mg/gram /actuation(2 %) Sopm  Commonly known as: PENNSAID  Apply 2 pumps to affected area up to four times a day.     EPINEPHrine 0.3 mg/0.3 mL injection  Commonly known as: EPIPEN  Inject 0.3 mL (0.3 mg total) into the muscle once for 1 dose.     estradioL 0.5 MG tablet  Commonly known as: ESTRACE  Take 1 tablet (0.5 mg total) by mouth daily. fluconazole 150 MG tablet  Commonly known as: DIFLUCAN  Take one tablet once. May repeat after 3 days as needed.     gabapentin 800 MG tablet  Commonly known as: NEURONTIN  Take 1 tablet (800 mg total) by mouth Two (2) times a day.     hydroCHLOROthiazide 12.5 MG tablet  Commonly known as: HYDRODIURIL  TAKE 1 TABLET(12.5 MG) BY MOUTH DAILY AS NEEDED FOR SWELLING     naloxone 4 mg/actuation nasal spray  Commonly known as: NARCAN  One spray in either nostril once for known/suspected opioid overdose. May repeat every 2-3 minutes in alternating nostril til EMS arrives     nortriptyline 25 MG capsule  Commonly known as: PAMELOR  TAKE 1 CAPSULE(25 MG) BY MOUTH EVERY NIGHT     olopatadine 0.1 % ophthalmic solution  Commonly known as: PatanoL  Administer 1 drop to both eyes Two (2) times a day.     oxyCODONE-acetaminophen 10-325 mg per tablet  Commonly known as: PERCOCET  Take 1 tablet by mouth every six (6) hours as needed for pain. Max 4 tabs/day. Fill on or after: 07/10/21. Brand name only due to allergy.     oxyCODONE-acetaminophen 10-325 mg per tablet  Commonly known as: PERCOCET  Take 1 tablet by mouth every six (6) hours as needed for pain. Max 4 tabs/day. Fill on or after: 08/09/21 Brand name only due to allergy.     oxyCODONE-acetaminophen 10-325 mg per tablet  Commonly known as: PERCOCET  Take 1 tablet by mouth every six (6) hours as needed for pain. Max 4 tabs/day. Fill on or after: 09/08/21. Brand name only due to allergy.     pantoprazole 40 MG tablet  Commonly known as: PROTONIX  TAKE 1 TABLET(40 MG) BY MOUTH DAILY     potassium chloride 20 MEQ ER tablet  Take 1 tablet (20 mEq total) by mouth daily.     PROCHAMBER Spcr  Generic drug: inhalational spacing device     rizatriptan 10 MG disintegrating tablet  Commonly known as: MAXALT-MLT  Take 1 tablet by mouth at ONSET of migraine or aura. May repeat in 2 HOURS if needed. DO NOT EXCEED 2 doses/day, 2 days/week, 8 doses/month.     rosuvastatin 10 MG tablet  Commonly known as: CRESTOR  TAKE 1 TABLET(10  MG) BY MOUTH EVERY NIGHT     triamcinolone 0.1 % cream  Commonly known as: KENALOG  Apply 1 application topically two (2) times a day as needed. WHEN ALLERGIES FLARE-UP AND CAUSES RASH / ITCHING     XHANCE 93 mcg/actuation Aerb  Generic drug: fluticasone propionate  1 spray into each nostril two (2) times a day as needed.     XOLAIR 150 mg/mL syringe  Generic drug: omalizumab  Inject the contents of 2 syringes (300 mg total) under the skin every twenty-eight (28) days.              Plan: Unfortunately her left knee continues to be symptomatic and spite of the viscosupplementation corticosteroid injections.  She has noticed that the symptoms have been worse in the past 2 months after tweaking her knee pushing her nephew in a wheelchair.  She has noticed pain with pivoting.  Her prior MRI showed evidence of some degeneration of the medial compartment but with a meniscus tear as well.  We discussed the option of a knee arthroscopy to address the meniscus tear and arthroscopic chondroplasty.  At this point she is not achieved significant progress.  Her x-rays showed some degeneration but not to the point that would require knee replacement surgery     No follow-ups on file.    Reed Pandy Lett, CMA  Date: 10/01/2021  Time: 9:43 AM

## 2021-10-01 NOTE — Unmapped (Signed)
Chart #: [id number] Patient: Natalie [name]  [birth year]  [phone]   DATE OF SURGERY: 10/01/21    Location: [x] [facility]      [x] [facility] [] [facility] [] [facility] [] [facility]     Assistant: []    Doristine Bosworth, PA-C   []    Verlon Setting, PA-C   []    Veverly Fells, PA-C   []    Lysle Rubens, PA-C   Time  1    Diagnosis:        Left knee medial meniscus tear, chondromalacia patella, medial compartment    [] S83.512A-ACL Tear/Sprain of left knee  []     S83.511A-ACL Tear/Sprain  right knee        [] S83.272A-Complex tear of lateral meniscus,  left knee  []     S83.271A-Complex tear of lateral meniscus right knee    []  S83.282A-Other tear of lateral meniscus, left knee  [] S83.281A-Other tear of lateral meniscus, right knee    []  S83.252A-Bucket-handle tear of lateral meniscus, left knee  [] S83.251A-Bucket-handle tear of lateral meniscus right knee     []   M22.2x2-Patellofemoral disorders, left knee  []     M22.2x1-Patellofemoral disorders, right knee    []   M71.22-Synovial cyst of popliteal space [Baker], left knee  []     M71.21-Synovial cyst of popliteal space [Baker], right knee    []  M25.362-Other instability, left knee  []     M25.361-Other instability, right knee    []   M17.12-Unilateral primary osteoarthritis, left knee   []     M17.11-Unilateral primary osteoarthritis right knee     []   R60.0-Localized edema - for subchondroplasty          Procedure: Left knee arthroscopy with partial medial meniscectomy, chondroplasty      []    [phone], synovectomy, 2 or more compartments     []    29875, synovectomy, limited       []   27418, anterior medialization   []   29873, lateral release       []    27422, reconstruction, extensor realignment, VMO    []   29880, meniscectomy, medial and lateral   [x]    29881, meniscectomy, medial or lateral     []    27425, lateral retinacular release   []    [phone], meniscus repair       []   20920, bone grafting   []    29888, ACL reconstruction        []   27415, Micronized cartilage implantation   []    29879, microfracture          []  27380 primary patella tendon repair    []    29855 , subchondroplasty unicondylar     []  84696 primary quad or hamstring repair    []   29856, subchondroplasy bicondylar        []  27446 partial knee replacement   []  27447 TKA   []  29528  MACI implant     []    41324 chondroplasty    Anesthesia  General         Special Equipment:    Signature:    [place] Constable M.D.  October 01, 2021 10:19 AM       ESTIMATED PROCEDURE:

## 2021-10-07 NOTE — Unmapped (Signed)
10-07-2021 at 1:00 pm    Called patient x2 today the call did not go through.  I sent her an email to call me.  NP

## 2021-10-09 ENCOUNTER — Ambulatory Visit: Admit: 2021-10-09 | Discharge: 2021-10-10 | Payer: MEDICARE

## 2021-10-09 DIAGNOSIS — M542 Cervicalgia: Principal | ICD-10-CM

## 2021-10-09 DIAGNOSIS — G894 Chronic pain syndrome: Principal | ICD-10-CM

## 2021-10-09 DIAGNOSIS — M7918 Myalgia, other site: Principal | ICD-10-CM

## 2021-10-09 DIAGNOSIS — M5417 Radiculopathy, lumbosacral region: Principal | ICD-10-CM

## 2021-10-09 MED ORDER — BACLOFEN 20 MG TABLET
ORAL_TABLET | 2 refills | 0 days | Status: CP
Start: 2021-10-09 — End: ?

## 2021-10-09 MED ORDER — GABAPENTIN 800 MG TABLET
ORAL_TABLET | Freq: Two times a day (BID) | ORAL | 0 refills | 90 days | Status: CP
Start: 2021-10-09 — End: ?

## 2021-10-09 MED ORDER — OXYCODONE-ACETAMINOPHEN 10 MG-325 MG TABLET
ORAL_TABLET | Freq: Four times a day (QID) | ORAL | 0 refills | 30 days | Status: CP | PRN
Start: 2021-10-09 — End: ?
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
## 2021-10-09 NOTE — Unmapped (Signed)
Medication:oxyCODONE-acetaminophen (PERCOCET) 10-325 mg per tablet   [id number] 1 tablet by mouth every six (6) hours as needed for pain. Max 4 tabs/day. Brand name only due to allergy   Quantity on RX: #120  Filled on:09/08/21  Pill count today: # 0

## 2021-10-09 NOTE — Unmapped (Signed)
It was good to see you today.    I have refilled your medications with no changes.    We will see you in 3 months, or sooner if needed.

## 2021-10-13 NOTE — Unmapped (Signed)
Pre-procedure call made and RN spoke with patient.    Denies recent infection/antibiotics.   Denies being diabetic.  Denies taking any recent blood thinners/NSAIDs.   Denies any electronic implants  Denies pregnancy    Driver necessary - no    Patient informed to arrive 30 minutes before procedure appointment time.  Patient verbalized understanding to all.

## 2021-10-15 ENCOUNTER — Ambulatory Visit: Admit: 2021-10-15 | Payer: MEDICARE | Attending: Anesthesiology | Primary: Anesthesiology

## 2021-10-15 NOTE — Unmapped (Deleted)
Trigger Point Injection of 3+ muscle groups: Bilateral cervical, thoracic and lumbar paraspinals, bilateral trapezius, rhomboids, and levator scapulae, bilateral quadratus and gluteus medius  Pre-operative diagnosis: Myofascial pain  Post-operative diagnosis: Same  Attending Physician: Dr. Criss Rosales  Assistant: Dr. Marcelle Smiling     Brief HPI:  The patient is a 51 y.o. female with past medical history of pseudotumor cerebri, bilateral knee osteoarthritis, myofascial pain syndrome, L5-S1 spinal fusion in 2001, coccydynia. She presents today for treatment of her myofascial paracervical, shoulders, upper mid and lower back areas with repeat trigger point injections      8/8-Last TPIs were 6/22, she endorsed good relief for 6 weeks.   Most recent procedure was caudal ESI on 08/27/2021.  She states that she has had relief with her pain in the buttocks and shooting pain down her legs.      Has serial consent from 07/16/21     After risks and benefits were explained including bleeding, infection, worsening of the pain, damage to the area being injected, weakness, allergic reaction to medications, vascular injection, pneumothorax and nerve damage, signed consent was obtained.  All questions were answered.   The patient is not taking antiplatelet or anticoagulation medications and does not have a driver today.     The area of the trigger point was identified and the skin prepped with chlorhexidine.  Next, a 25 gauge 1.5 inch needle was placed in the area of the trigger points in the lumbar paraspinals and the gluteus medius; a 27 gauge 0.5 inch needle was used for everything else above.  Dry needling was performed in the area of the trigger point.  Once reproduction of the pain was elicited and negative aspiration confirmed, the trigger point was injected with 0.5-1 mL of 0.25% bupivacaine and the needle removed.      The patient did tolerate the procedure well and there were not complications.       Trigger points injected:      Trigger point locations:  bilateral cervical, thoracic and lumbar paraspinals, bilateral trapezius, rhomboids, and levator scapulae, bilateral quadratus, and gluteus medius     The patient was monitored for 15 minutes after the procedure.  Vital signs remained normal and the patient ambulated out of clinic     Pre-procedure pain score: /10  Post-procedure pain score: /10     DISPO:     - Repeat TPI ordered for 6-8 weeks  -Pain psychology appointment 10/9  -CPP appointment 12/7

## 2021-10-15 NOTE — Unmapped (Signed)
[facility] Administered Medication Refill Coordination Note      NAME:Natalie [name] DOB: March 12, 1970      Medication: Xolair    Day Supply: 28 days      SHIPPING      Next delivery from [facility] [phone]) to  [facility] [name]  for [name] is scheduled for n/a.    Clinic contact: [name]    Patient's next nurse visit for administration: n/a-clinic denied refill at this time advised will follow-up in 3 weeks.    We will follow up with clinic monthly for standard refill processing and delivery.      [name]  Specialty Pharmacy Technician

## 2021-10-19 NOTE — Unmapped (Signed)
Stuck in traffic, would like to reschedule TPI visit.

## 2021-10-20 ENCOUNTER — Ambulatory Visit: Admit: 2021-10-20 | Discharge: 2021-10-21 | Payer: MEDICARE | Attending: Family Medicine | Primary: Family Medicine

## 2021-10-20 DIAGNOSIS — N760 Acute vaginitis: Principal | ICD-10-CM

## 2021-10-20 DIAGNOSIS — R600 Localized edema: Principal | ICD-10-CM

## 2021-10-20 DIAGNOSIS — Z23 Encounter for immunization: Principal | ICD-10-CM

## 2021-10-20 DIAGNOSIS — J0100 Acute maxillary sinusitis, unspecified: Principal | ICD-10-CM

## 2021-10-20 MED ORDER — AMOXICILLIN 500 MG CAPSULE
ORAL_CAPSULE | Freq: Three times a day (TID) | ORAL | 0 refills | 10 days | Status: CP
Start: 2021-10-20 — End: 2021-10-30

## 2021-10-20 MED ORDER — BORIC ACID IN GELATIN CAPSULES
0 refills | 0.00000 days | Status: CN
Start: 2021-10-20 — End: ?

## 2021-10-20 MED ORDER — HYDROCHLOROTHIAZIDE 12.5 MG TABLET
ORAL_TABLET | 2 refills | 0 days | Status: CP
Start: 2021-10-20 — End: ?

## 2021-10-20 MED ORDER — FLUCONAZOLE 150 MG TABLET
ORAL_TABLET | 0 refills | 0 days | Status: CP
Start: 2021-10-20 — End: ?

## 2021-10-20 NOTE — Unmapped (Signed)
Patient is requesting the following refill  Requested Prescriptions     Pending Prescriptions Disp Refills    hydroCHLOROthiazide (HYDRODIURIL) 12.5 MG tablet [Pharmacy Med Name: HYDROCHLOROTHIAZIDE 12.5MG  TABLETS] 30 tablet 0     Sig: TAKE 1 TABLET(12.5 MG) BY MOUTH DAILY AS NEEDED FOR SWELLING       Recent Visits  Date Type Provider Dept   08/13/21 Office Visit Leana Gamer, [place] [facility]   07/23/21 Office Visit Neelagiri, Venkat Rama Gavin Pound, MD [place] [facility]   06/08/21 Office Visit Neelagiri, Venkat Rama Gavin Pound, MD [place] [facility]   05/05/21 Telemedicine Neelagiri, Venkat Rama Gavin Pound, MD [place] [facility]   04/15/21 Telemedicine Neelagiri, Venkat Rama Gavin Pound, MD [place] [facility]   04/13/21 Office Visit Neelagiri, Venkat Rama Gavin Pound, MD [place] [facility]   04/01/21 Office Visit Neelagiri, Venkat Rama Gavin Pound, MD [place] [facility]   03/02/21 Office Visit Neelagiri, Venkat Rama Gavin Pound, MD [place] [facility]   02/12/21 Office Visit Neelagiri, Venkat Rama Gavin Pound, MD [place] [facility]   12/23/20 Office Visit Neelagiri, Venkat Rama Gavin Pound, MD [place] [facility]   Showing recent visits within past 365 days with a meds authorizing provider and meeting all other requirements  Today's Visits  Date Type Provider Dept   10/20/21 Appointment Neelagiri, Venkat Rama Gavin Pound, MD [place] [facility]   Showing today's visits with a meds authorizing provider and meeting all other requirements  Future Appointments  No visits were found meeting these conditions.  Showing future appointments within next 365 days with a meds authorizing provider and meeting all other requirements Labs: Creatinine:   Creatinine, Serum (mg/dL)   Date Value   16/10/9602 0.80     Creatinine   Date Value   02/16/2021 0.63 mg/dL   54/09/8117 0.8 mg/dl    Potassium:   Potassium, Serum (mEq/L)   Date Value   12/19/2012 4.1     Potassium (mmol/L)   Date Value   02/16/2021 3.3 (L)   01/02/2015 3.9    Sodium:   Sodium (mmol/L)   Date Value   02/16/2021 143   01/02/2015 141    Vitals:   BP Readings from Last 3 Encounters:   10/09/21 130/83   10/01/21 137/88   09/01/21 126/83    and   Pulse Readings from Last 3 Encounters:   10/09/21 78   10/01/21 62   09/01/21 83

## 2021-10-20 NOTE — Unmapped (Signed)
Name:  Drema Staudt  DOB: 09-Sep-1970  Today's Date: 10/23/2021  Age:  51 y.o.    Assessment/Plan:      Randel was seen today for sinusitis, ingrown toenail and vaginitis.    Diagnoses and all orders for this visit:    Acute non-recurrent maxillary sinusitis  -     amoxicillin (AMOXIL) 500 MG capsule; Take 1 capsule (500 mg total) by mouth Three (3) times a day for 10 days.    Immunization due  -     INFLUENZA VACCINE (QUAD) IM - 6 MO-ADULT - PF    Acute vaginitis  -     fluconazole (DIFLUCAN) 150 MG tablet; Take one tablet once. May repeat after 3 days as needed.  -     POCT vaginal pH; Future  -     POCT vaginal pH      Antibiotic treatment for acute sinusitis.  Salt / warm water gargling, and saline nasal rinsing.   Advised to continue nasal Xhance and azelastine sprays, and oral anti-histamine tablet.    POCT vaginal PH test is negative, and I couldn't see anything abnormal on pelvic exam.  Oral fluconazole prescribed, if she were to develop any vaginal yeast infection in response to oral antibiotics.    She requests me to prescribe boric acid vaginal suppositories for vaginal infection, which her previous GYN used to prescribe in the past. I have never prescribed it to her.      Diagnosis and plan along with any newly prescribed medication(s) were discussed in detail with this patient today. The patient verbalized understanding and agreed with the plan without language barriers or behavioral barriers to understanding unless otherwise noted.    Subjective:      HPI: Camber Drinnen is a 51 y.o. female is here for    Chief Complaint   Patient presents with    Sinusitis    Ingrown Toenail    Vaginitis     She has been having vaginal itching and cream thick vaginal discharge for a week. It has strong odor to it.  She has occasional burning sensation.  No dysuria / urinary frequency.    She has dark green nasal drainage, and postnasal drainage. She has sinus congestion, ear fullness and bad breath. This has been for 2 weeks.   No fever, chest tightness, cough, or shortness of breath.  Using azelastine and XHANCE nasal spray daily.    Ingrown toe nail.         Past Medical/Surgical History:     Past Medical History:   Diagnosis Date    Allergic conjunctivitis 11/23/2018    Anemia 1986    Arthritis     Breast mass     Chondromalacia of left knee     Chondromalacia of right knee     Depressive disorder     Fibrocystic breast     GERD (gastroesophageal reflux disease)     Hematuria     History of kidney stones     History of transfusion     Hyperlipidemia 1999    Hypothyroidism     Lumbar disc disease 1998    She was injured at work at the age of 63 and then had disk surgery 2 years later     Menopause ovarian failure     Menopause, premature     Migraine with aura     Neuromuscular disorder (CMS-HCC) 1999    Obesity (BMI 30-39.9) 11/23/2018    Ovarian cyst  Plantar fasciitis     Pseudotumor cerebri     Rash due to allergy 11/23/2018    Sickle cell trait (CMS-HCC)     Urinary tract infection     Vaginitis      Past Surgical History:   Procedure Laterality Date    BACK SURGERY  2001    BREAST BIOPSY Bilateral     When patient was 16 & 17 Both Negative    HYSTERECTOMY  2008    PR REMOVAL OF TONSILS,13+ Y/O Bilateral 10/10/2019    Procedure: TONSILLECTOMY;  Surgeon: Lona Millard, MD;  Location: OR [place];  Service: ENT    SALPINGOOPHORECTOMY Bilateral 2007    SPINE SURGERY  2001    TOTAL VAGINAL HYSTERECTOMY  01/04/2006    Uterine Prolapse-Dr. Leeanne Rio OP Note    TUBAL LIGATION  1995       Family History:     Family History   Problem Relation Age of Onset    Heart attack Father     Mental illness Father     Asthma Father     Hypertension Mother     Heart disease Mother     Arthritis Mother     Depression Mother     Drug abuse Mother     Mental illness Mother     Ulcers Mother     COPD Mother     Heart attack Maternal Grandmother     Heart disease Maternal Grandmother     Hypertension Maternal Grandmother     Thyroid disease Maternal Grandmother     Breast cancer Cousin 62    Cancer Sister         lymphoma    COPD Maternal Aunt     Depression Son     Drug abuse Son     Mental illness Son     Stroke Paternal Grandfather     Thyroid disease Other     Alcohol abuse Neg Hx        Social History:     Social History     Socioeconomic History    Marital status: Single     Spouse name: None    Number of children: 2    Years of education: 15    Highest education level: None   Occupational History     Employer: NOT EMPLOYED   Tobacco Use    Smoking status: Never     Passive exposure: Past    Smokeless tobacco: Never   Vaping Use    Vaping Use: Never used   Substance and Sexual Activity    Alcohol use: Never    Drug use: Never    Sexual activity: Yes     Partners: Male     Birth control/protection: Post-menopausal, Surgical     Comment: steady partner for 4 yrs, as on 04/03/19.   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, forensic)     Country of Origin - [place]  Social Determinants of Health     Financial Resource Strain: High Risk (10/20/2021)    Overall Financial Resource Strain (CARDIA)     Difficulty of Paying Living Expenses: Hard   Food Insecurity: Food Insecurity Present (10/20/2021)    Hunger Vital Sign     Worried About Running Out of Food in the Last Year: Sometimes true     Ran Out of Food in the Last Year: Sometimes true   Transportation Needs: Unmet Transportation Needs (10/20/2021)    PRAPARE - Therapist, art (Medical): Yes     Lack of Transportation (Non-Medical): Yes       Allergies:     Buprenorphine hcl, Pregabalin, Adhesive tape-silicones, Doxycycline hyclate (bulk), Keflex [cephalexin], Opioids - morphine analogues, and Oxycodone-acetaminophen    Current Medications:     Current Outpatient Medications   Medication Sig Dispense Refill    albuterol HFA 90 mcg/actuation inhaler Inhale 2 puffs every four (4) hours as needed for wheezing. 25.5 g 0    azelastine (ASTELIN) 137 mcg (0.1 %) nasal spray 2 sprays into each nostril Two (2) times a day. 30 mL 12    azelastine (OPTIVAR) 0.05 % ophthalmic solution Administer 2 drops to both eyes daily as needed. 6 mL 5    baclofen (LIORESAL) 20 MG tablet 10 - 20 mg three times a day as needed for spasms. 90 tablet 2    cetirizine (ZYRTEC) 10 MG tablet       diclofenac sodium (PENNSAID) 20 mg/gram /actuation(2 %) sopm Apply 2 pumps to affected area up to four times a day. 112 g 2    estradioL (ESTRACE) 0.5 MG tablet Take 1 tablet (0.5 mg total) by mouth daily. 90 tablet 0    gabapentin (NEURONTIN) 800 MG tablet Take 1 tablet (800 mg total) by mouth Two (2) times a day. 180 tablet 0    hydroCHLOROthiazide (HYDRODIURIL) 12.5 MG tablet TAKE 1 TABLET(12.5 MG) BY MOUTH DAILY AS NEEDED FOR SWELLING 30 tablet 2    naloxone (NARCAN) 4 mg nasal spray One spray in either nostril once for known/suspected opioid overdose. May repeat every 2-3 minutes in alternating nostril til EMS arrives 1 each 0    nortriptyline (PAMELOR) 25 MG capsule TAKE 1 CAPSULE(25 MG) BY MOUTH EVERY NIGHT 90 capsule 1    olopatadine (PATANOL) 0.1 % ophthalmic solution Administer 1 drop to both eyes Two (2) times a day. 5 mL 1    omalizumab (XOLAIR) 150 mg/mL syringe Inject the contents of 2 syringes (300 mg total) under the skin every twenty-eight (28) days. 2 mL 12    oxyCODONE-acetaminophen (PERCOCET) 10-325 mg per tablet Take 1 tablet by mouth every six (6) hours as needed for pain. Max 4 tabs/day. Fill on or after: 10/09/21. Brand name only due to allergy. 120 tablet 0    [START ON 11/07/2021] oxyCODONE-acetaminophen (PERCOCET) 10-325 mg per tablet Take 1 tablet by mouth every six (6) hours as needed for pain. Max 4 tabs/day. Fill on or after: 11/08/21. Brand name only due to allergy. 120 tablet 0    [START ON 12/08/2021] oxyCODONE-acetaminophen (PERCOCET) 10-325 mg per tablet Take 1 tablet by mouth every six (6) hours as needed for pain. Max 4 tabs/day. Fill on or after: 12/08/21. Brand name only due to allergy. 120 tablet 0    pantoprazole (PROTONIX) 40 MG tablet TAKE 1 TABLET(40 MG) BY MOUTH DAILY 30 tablet 4    potassium chloride 20 MEQ CR tablet Take 1  tablet (20 mEq total) by mouth daily. 90 tablet 2    PROCHAMBER Spcr       rizatriptan (MAXALT-MLT) 10 MG disintegrating tablet Take 1 tablet by mouth at ONSET of migraine or aura. May repeat in 2 HOURS if needed. DO NOT EXCEED 2 doses/day, 2 days/week, 8 doses/month. 10 tablet 5    rosuvastatin (CRESTOR) 10 MG tablet TAKE 1 TABLET(10 MG) BY MOUTH EVERY NIGHT 90 tablet 1    triamcinolone (KENALOG) 0.1 % cream Apply 1 application topically two (2) times a day as needed. WHEN ALLERGIES FLARE-UP AND CAUSES RASH / ITCHING 80 g 0    XHANCE 93 mcg/actuation AerB 1 spray into each nostril two (2) times a day as needed. 16 mL 11    amoxicillin (AMOXIL) 500 MG capsule Take 1 capsule (500 mg total) by mouth Three (3) times a day for 10 days. 30 capsule 0    EPINEPHrine (EPIPEN) 0.3 mg/0.3 mL injection Inject 0.3 mL (0.3 mg total) into the muscle once for 1 dose. 2 each 0    fluconazole (DIFLUCAN) 150 MG tablet Take one tablet once. May repeat after 3 days as needed. 2 tablet 0     No current facility-administered medications for this visit.       ROS:     Review of Systems    Vital Signs:     Wt Readings from Last 3 Encounters:   10/20/21 70.3 kg (154 lb 14.4 oz)   10/09/21 70.4 kg (155 lb 4.8 oz)   10/01/21 68.9 kg (152 lb)     Temp Readings from Last 3 Encounters:   10/20/21 36.7 ??C (98 ??F) (Temporal)   10/09/21 36.7 ??C (98.1 ??F) (Skin)   09/01/21 36.3 ??C (97.4 ??F) (Skin)     BP Readings from Last 3 Encounters:   10/20/21 124/78 10/09/21 130/83   10/01/21 137/88     Pulse Readings from Last 3 Encounters:   10/20/21 75   10/09/21 78   10/01/21 62     Body mass index is 29.28 kg/m??.    Objective:      Physical Exam  Vitals and nursing note reviewed.   Constitutional:       General: She is not in acute distress.     Appearance: Normal appearance. She is well-developed.   HENT:      Right Ear: No tenderness. A middle ear effusion is present. Tympanic membrane is not injected.      Left Ear: No tenderness. A middle ear effusion is present. Tympanic membrane is not injected.      Nose: Congestion and rhinorrhea present.      Right Sinus: Maxillary sinus tenderness present. No frontal sinus tenderness.      Left Sinus: Maxillary sinus tenderness present. No frontal sinus tenderness.      Mouth/Throat:      Pharynx: No oropharyngeal exudate or posterior oropharyngeal erythema.   Neck:      Thyroid: No thyromegaly.   Cardiovascular:      Rate and Rhythm: Normal rate and regular rhythm.      Heart sounds: Normal heart sounds. No murmur heard.  Pulmonary:      Effort: Pulmonary effort is normal. No respiratory distress.      Breath sounds: Normal breath sounds. No stridor. No wheezing or rales.   Chest:      Chest wall: No tenderness.   Genitourinary:     General: Normal vulva.      Exam position: Lithotomy position.  Vagina: No vaginal discharge.      Cervix: Normal.      Comments: Examined with CMA Viann Shove as the chaperone.    Vaginal PH test (POCT) is negative.  Musculoskeletal:      Cervical back: Neck supple.   Lymphadenopathy:      Cervical: No cervical adenopathy.   Neurological:      Mental Status: She is alert.           Labs:     Results for orders placed or performed in visit on 10/20/21   POCT vaginal pH   Result Value Ref Range    pH NEGATIVE        VENKAT R Mrk Buzby, MD  10/23/2021

## 2021-10-27 ENCOUNTER — Ambulatory Visit: Admit: 2021-10-27 | Discharge: 2021-10-28 | Payer: MEDICARE

## 2021-10-27 DIAGNOSIS — E0590 Thyrotoxicosis, unspecified without thyrotoxic crisis or storm: Principal | ICD-10-CM

## 2021-10-27 DIAGNOSIS — R232 Flushing: Principal | ICD-10-CM

## 2021-10-27 LAB — COMPREHENSIVE METABOLIC PANEL
ALBUMIN: 3.8 g/dL (ref 3.4–5.0)
ALKALINE PHOSPHATASE: 54 U/L (ref 46–116)
ALT (SGPT): 53 U/L — ABNORMAL HIGH (ref 10–49)
ANION GAP: 5 mmol/L (ref 3–11)
AST (SGOT): 75 U/L — ABNORMAL HIGH (ref <34)
BILIRUBIN TOTAL: 0.3 mg/dL (ref 0.3–1.2)
BLOOD UREA NITROGEN: 10 mg/dL (ref 9–23)
BUN / CREAT RATIO: 16
CALCIUM: 9.8 mg/dL (ref 8.7–10.4)
CHLORIDE: 102 mmol/L (ref 98–107)
CO2: 34 mmol/L — ABNORMAL HIGH (ref 20.0–31.0)
CREATININE: 0.63 mg/dL (ref 0.55–1.02)
EGFR CKD-EPI (2021) FEMALE: >90 mL/min/{1.73_m2} (ref >=60)
GLUCOSE RANDOM: 94 mg/dL (ref 70–179)
POTASSIUM: 4.3 mmol/L (ref 3.5–5.1)
PROTEIN TOTAL: 7 g/dL (ref 5.7–8.2)
SODIUM: 141 mmol/L (ref 136–145)

## 2021-10-27 LAB — FOLLICLE STIMULATING HORMONE: FOLLICLE STIMULATING HORMONE: 130.7 m[IU]/mL

## 2021-10-27 LAB — TSH: THYROID STIMULATING HORMONE: 1.558 u[IU]/mL (ref 0.550–4.780)

## 2021-10-27 LAB — ESTRADIOL(ESTROGEN) LEVEL: ESTRADIOL LEVEL: <11.8 pg/mL

## 2021-11-02 DIAGNOSIS — N760 Acute vaginitis: Principal | ICD-10-CM

## 2021-11-02 MED ORDER — HYLAFEM BORICUM ACIDUM 2X VAGINAL SUPPOSITORY
Freq: Every day | VAGINAL | 0 refills | 7 days | Status: CP
Start: 2021-11-02 — End: 2021-11-09

## 2021-11-06 NOTE — Unmapped (Signed)
[facility] Administered Medication Refill Coordination Note      NAME:Audrinna Udy Pore DOB: 02-28-1970      Medication: Xolair    Day Supply: 28 days      SHIPPING      Next delivery from [facility] [phone]) to  [facility]   for [place] is scheduled for n/a.    Clinic contact: [name]    Patient's next nurse visit for administration: n/a clinic denied refill @ this time will follow-up in 3 weeks.    We will follow up with clinic monthly for standard refill processing and delivery.      Seth Friedlander Samella Parr  Specialty Pharmacy Technician

## 2021-11-07 DIAGNOSIS — M1711 Unilateral primary osteoarthritis, right knee: Principal | ICD-10-CM

## 2021-11-07 DIAGNOSIS — M1712 Unilateral primary osteoarthritis, left knee: Principal | ICD-10-CM

## 2021-11-07 DIAGNOSIS — M7061 Trochanteric bursitis, right hip: Principal | ICD-10-CM

## 2021-11-07 MED ORDER — DICLOFENAC 20 MG/GRAM/ACTUATION (2 %) TOPICAL SOLN METERED-DOSE PUMP
2 refills | 0 days
Start: 2021-11-07 — End: ?

## 2021-11-07 MED ORDER — OXYCODONE-ACETAMINOPHEN 10 MG-325 MG TABLET
ORAL_TABLET | Freq: Four times a day (QID) | ORAL | 0 refills | 30 days | Status: CP | PRN
Start: 2021-11-07 — End: ?

## 2021-11-09 MED ORDER — DICLOFENAC 20 MG/GRAM/ACTUATION (2 %) TOPICAL SOLN METERED-DOSE PUMP
2 refills | 0 days
Start: 2021-11-09 — End: ?

## 2021-11-12 ENCOUNTER — Telehealth: Admit: 2021-11-12 | Discharge: 2021-11-13 | Payer: MEDICARE

## 2021-11-12 DIAGNOSIS — N760 Acute vaginitis: Principal | ICD-10-CM

## 2021-11-12 DIAGNOSIS — J0100 Acute maxillary sinusitis, unspecified: Principal | ICD-10-CM

## 2021-11-12 MED ORDER — FLUCONAZOLE 150 MG TABLET
ORAL_TABLET | Freq: Once | ORAL | 0 refills | 0.00000 days | Status: CP
Start: 2021-11-12 — End: 2021-11-12

## 2021-11-12 MED ORDER — AMOXICILLIN 500 MG CAPSULE
ORAL_CAPSULE | Freq: Three times a day (TID) | ORAL | 0 refills | 10 days | Status: CP
Start: 2021-11-12 — End: 2021-11-22

## 2021-11-12 NOTE — Unmapped (Signed)
Name:  Natalie [name]  DOB: 04/28/1970  Today's Date: 11/12/2021  Age:  51 y.o.    Assessment/Plan:      There are no diagnoses linked to this encounter.    Diagnosis and plan along with any newly prescribed medication(s) were discussed in detail with this patient today. The patient verbalized understanding and agreed with the plan without language barriers or behavioral barriers to understanding unless otherwise noted.    Subjective:      HPI: Natalie [name] is a 51 y.o. female is here for    Chief Complaint   Patient presents with    Sinus Problem       {[id number]}     Past Medical/Surgical History:     Past Medical History:   Diagnosis Date    Allergic conjunctivitis 11/23/2018    Anemia 1986    Arthritis     Breast mass     Chondromalacia of left knee     Chondromalacia of right knee     Depressive disorder     Fibrocystic breast     GERD (gastroesophageal reflux disease)     Hematuria     History of kidney stones     History of transfusion     Hyperlipidemia 1999    Hypothyroidism     Lumbar disc disease 1998    She was injured at work at the age of 58 and then had disk surgery 2 years later     Menopause ovarian failure     Menopause, premature     Migraine with aura     Neuromuscular disorder (CMS-HCC) 1999    Obesity (BMI 30-39.9) 11/23/2018    Ovarian cyst     Plantar fasciitis     Pseudotumor cerebri     Rash due to allergy 11/23/2018    Sickle cell trait (CMS-HCC)     Urinary tract infection     Vaginitis      Past Surgical History:   Procedure Laterality Date    BACK SURGERY  2001    BREAST BIOPSY Bilateral     When patient was 16 & 17 Both Negative    HYSTERECTOMY  2008    PR REMOVAL OF TONSILS,13+ Y/O Bilateral 10/10/2019    Procedure: TONSILLECTOMY;  Surgeon: Lona Millard, MD;  Location: [facility];  Service: ENT    SALPINGOOPHORECTOMY Bilateral 2007    SPINE SURGERY  2001    TOTAL VAGINAL HYSTERECTOMY  01/04/2006    Uterine Prolapse-Dr. Leeanne Rio OP Note    TUBAL LIGATION  1995       Family History:     Family History   Problem Relation Age of Onset    Heart attack Father     Mental illness Father     Asthma Father     Hypertension Mother     Heart disease Mother     Arthritis Mother     Depression Mother     Drug abuse Mother     Mental illness Mother     Ulcers Mother     COPD Mother     Heart attack Maternal Grandmother     Heart disease Maternal Grandmother     Hypertension Maternal Grandmother     Thyroid disease Maternal Grandmother     Breast cancer Cousin 42    Cancer Sister         lymphoma    COPD Maternal Aunt     Depression Son  Drug abuse Son     Mental illness Son     Stroke Paternal Grandfather     Thyroid disease Other     Alcohol abuse Neg Hx        Social History:     Social History     Socioeconomic History    Marital status: Single     Spouse name: None    Number of children: 2    Years of education: 15    Highest education level: None   Occupational History     Employer: NOT EMPLOYED   Tobacco Use    Smoking status: Never     Passive exposure: Past    Smokeless tobacco: Never   Vaping Use    Vaping Use: Never used   Substance and Sexual Activity    Alcohol use: Never    Drug use: Never    Sexual activity: Yes     Partners: Male     Birth control/protection: Post-menopausal, Surgical     Comment: steady partner for 4 yrs, as on 04/03/19.   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, forensic)     Country of Origin - [place]                      Social Determinants of Health     Financial Resource Strain: High Risk (10/20/2021)    Overall Financial Resource Strain (CARDIA)     Difficulty of Paying Living Expenses: Hard   Food Insecurity: Food Insecurity children: 2    Years of education: 15    Highest education level: None   Occupational History     Employer: NOT EMPLOYED   Tobacco Use    Smoking status: Never     Passive exposure: Past    Smokeless tobacco: Never   Vaping Use    Vaping Use: Never used   Substance and Sexual Activity    Alcohol use: Never    Drug use: Never    Sexual activity: Yes     Partners: Male     Birth control/protection: Post-menopausal, Surgical     Comment: steady partner for 4 yrs, as on 04/03/19.   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, forensic)     Country of Origin - [place]                      Social Determinants of Health     Financial Resource Strain: High Risk (10/20/2021)    Overall Financial Resource Strain (CARDIA)     Difficulty of Paying  Living Expenses: Hard   Food Insecurity: Food Insecurity Present (10/20/2021)    Hunger Vital Sign     Worried About Running Out of Food in the Last Year: Sometimes true     Ran Out of Food in the Last Year: Sometimes true   Transportation Needs: Unmet Transportation Needs (10/20/2021)    PRAPARE - Therapist, art (Medical): Yes     Lack of Transportation (Non-Medical): Yes       Allergies:     Buprenorphine hcl, Pregabalin, Adhesive tape-silicones, Doxycycline hyclate (bulk), Keflex [cephalexin], Opioids - morphine analogues, and Oxycodone-acetaminophen    Current Medications:     Current Outpatient Medications   Medication Sig Dispense Refill    albuterol HFA 90 mcg/actuation inhaler Inhale 2 puffs every four (4) hours as needed for wheezing. 25.5 g 0    azelastine (ASTELIN) 137 mcg (0.1 %) nasal spray 2 sprays into each nostril Two (2) times a day. 30 mL 12 Two (2) times a day. 5 mL 1    omalizumab (XOLAIR) 150 mg/mL syringe Inject the contents of 2 syringes (300 mg total) under the skin every twenty-eight (28) days. 2 mL 12    oxyCODONE-acetaminophen (PERCOCET) 10-325 mg per tablet Take 1 tablet by mouth every six (6) hours as needed for pain. Max 4 tabs/day. Fill on or after: 11/08/21. Brand name only due to allergy. 120 tablet 0    pantoprazole (PROTONIX) 40 MG tablet TAKE 1 TABLET(40 MG) BY MOUTH DAILY 30 tablet 4    potassium chloride 20 MEQ CR tablet Take 1 tablet (20 mEq total) by mouth daily. 90 tablet 2    PROCHAMBER Spcr       rizatriptan (MAXALT-MLT) 10 MG disintegrating tablet Take 1 tablet by mouth at ONSET of migraine or aura. May repeat in 2 HOURS if needed. DO NOT EXCEED 2 doses/day, 2 days/week, 8 doses/month. 10 tablet 5    rosuvastatin (CRESTOR) 10 MG tablet TAKE 1 TABLET(10 MG) BY MOUTH EVERY NIGHT 90 tablet 1    triamcinolone (KENALOG) 0.1 % cream Apply 1 application topically two (2) times a day as needed. WHEN ALLERGIES FLARE-UP AND CAUSES RASH / ITCHING 80 g 0    XHANCE 93 mcg/actuation AerB 1 spray into each nostril two (2) times a day as needed. 16 mL 11    EPINEPHrine (EPIPEN) 0.3 mg/0.3 mL injection Inject 0.3 mL (0.3 mg total) into the muscle once for 1 dose. 2 each 0    naloxone (NARCAN) 4 mg nasal spray One spray in either nostril once for known/suspected opioid overdose. May repeat every 2-3 minutes in alternating nostril til EMS arrives 1 each 0    oxyCODONE-acetaminophen (PERCOCET) 10-325 mg per tablet Take 1 tablet by mouth every six (6) hours as needed for pain. Max 4 tabs/day. Fill on or after: 10/09/21. Brand name only due to allergy. (Patient not taking: Reported on 11/12/2021) 120 tablet 0    [START ON 12/08/2021] oxyCODONE-acetaminophen (PERCOCET) 10-325 mg per tablet Take 1 tablet by mouth every six (6) hours as needed for pain. Max 4 tabs/day. Fill on or after: 12/08/21. Brand name only due to allergy. 120 tablet 0     No current facility-administered medications for this visit.       ROS:     Review of Systems    Vital Signs:     Wt Readings from Last 3 Encounters:   10/20/21 70.3 kg (154 lb 14.4 oz)   10/09/21 70.4 kg (  155 lb 4.8 oz)   10/01/21 68.9 kg (152 lb)     Temp Readings from Last 3 Encounters:   10/20/21 36.7 ??C (98 ??F) (Temporal)   10/09/21 36.7 ??C (98.1 ??F) (Skin)   09/01/21 36.3 ??C (97.4 ??F) (Skin)     BP Readings from Last 3 Encounters:   10/20/21 124/78   10/09/21 130/83   10/01/21 137/88     Pulse Readings from Last 3 Encounters:   10/20/21 75   10/09/21 78   10/01/21 62     There is no height or weight on file to calculate BMI.    Objective:      Physical Exam      Labs:     No results found for this visit on 11/12/21.    Royal Hawthorn, MD  11/12/2021 MD  11/14/2021      The patient reports they are physically located in [place] and is currently: at home. I conducted a audio/video visit. I spent  10 on the video call with the patient. I spent an additional 2 minutes on pre- and post-visit activities on the date of service .

## 2021-11-18 NOTE — Unmapped (Addendum)
Pre-procedure call made and RN spoke with patient    Recent infection/antibiotics. Yes, completed amoxicillin one week ago for sinus infection. Feels better now.  Dr. Oda Kilts OK to proceed with procedure.    Are you diabetic? No Monitor Blood Glucose Daily? N/A    Taking any recent blood thinners/NSAIDs. No    Any electronic implants No    Pregnancy N/A    Driver necessary No    Patient informed to arrive 30 minutes before procedure appointment time.  Patient verbalized understanding to all.

## 2021-11-18 NOTE — Unmapped (Signed)
PATIENT: Natalie [name]      AGE: 51 y.o.     DOB: 1970/06/27    Medical Record Number:  [id number]            Date of Exam:  11/19/2021      Primary Care Provider:  Royal Hawthorn, MD       Chief Complaint     Chief Complaint   Patient presents with    Left Knee - Follow-up    Right Knee - Follow-up        History Of Present Illness   Natalie [name] is a  51 y.o.  female who presents today for evaluation of left knee.  She has been complaining of pain in her knee for the past several years.  She has managed this with viscosupplementation as well as corticosteroid injections.  She underwent her last Visco supplementation nearly a year ago.  It was not quite as effective but she did get relief.  Her prior injections lasted about 2 years.  She complains of pain and stiffness in the knee.  The pain is achy and dull but can become sharp at times.  She is felt some discomfort going up and down stairs as well as pivoting.  She has used naproxen as well as Percocet at times to manage the symptoms.    Today's History Update 07/02/2021 - Patient presents for MRI results review of left knee.  She continues to complain of pain medially in the left knee.  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    Today's History Update 10/01/2021 - Patient presents for follow up of bilateral knees.  Her right knee is improved after the viscosupplementation however her left knee continues to be symptomatic.  She has been complaining of ongoing pain over the past 4 months or so in spite of corticosteroid injection as well as viscosupplementation.  She has noticed pain throughout the knee but notices specifically medially when she pivots on the knee and sits crosslegged.  Her prior MRI showed a tear of the medial meniscus as well as degeneration in the medial compartment    Today's History Update 11/19/2021 - Patient presents for follow up of bilateral knees.  Her low knee is the more symptomatic of the 2.  She noticed the onset of her symptoms 2 to 3 weeks ago.  She was pushing her brother in a wheelchair up and inclined.  She feels that this aggravated her knee.  She denies twisting her knee.  She complains of pain medial in location.  She has had some difficulty walking.  She has had a previous MRI demonstrating a meniscus tear as well as some degeneration in the medial compartment.  She has had some benefit with viscosupplementation but symptoms acutely worse at this point.  Home Medications     Current Outpatient Medications:     albuterol HFA 90 mcg/actuation inhaler, Inhale 2 puffs every four (4) hours as needed for wheezing., Disp: 25.5 g, Rfl: 0    amoxicillin (AMOXIL) 500 MG capsule, Take 1 capsule (500 mg total) by mouth Three (3) times a day for 10 days., Disp: 30 capsule, Rfl: 0    azelastine (ASTELIN) 137 mcg (0.1 %) nasal spray, 2 sprays into each nostril Two (2) times a day., Disp: 30 mL, Rfl: 12    azelastine (OPTIVAR) 0.05 % ophthalmic solution, Administer 2 drops to both eyes daily as needed., Disp: 6 mL, Rfl: 5    baclofen (LIORESAL)  20 MG tablet, 10 - 20 mg three times a day as needed for spasms., Disp: 90 tablet, Rfl: 2    cetirizine (ZYRTEC) 10 MG tablet, , Disp: , Rfl:     diclofenac sodium (PENNSAID) 20 mg/gram /actuation(2 %) sopm, Apply 2 pumps to affected area up to four times a day., Disp: 112 g, Rfl: 2    estradioL (ESTRACE) 0.5 MG tablet, Take 1 tablet (0.5 mg total) by mouth daily., Disp: 90 tablet, Rfl: 0    gabapentin (NEURONTIN) 800 MG tablet, Take 1 tablet (800 mg total) by mouth Two (2) times a day., Disp: 180 tablet, Rfl: 0    hydroCHLOROthiazide (HYDRODIURIL) 12.5 MG tablet, TAKE 1 TABLET(12.5 MG) BY MOUTH DAILY AS NEEDED FOR SWELLING, Disp: 30 tablet, Rfl: 2    naloxone (NARCAN) 4 mg nasal spray, One spray in either nostril once for known/suspected opioid overdose. May repeat every 2-3 minutes in alternating nostril til EMS arrives, Disp: 1 each, Rfl: 0    nortriptyline (PAMELOR) 25 MG capsule, TAKE 1 CAPSULE(25 MG) BY MOUTH EVERY NIGHT, Disp: 90 capsule, Rfl: 1    olopatadine (PATANOL) 0.1 % ophthalmic solution, Administer 1 drop to both eyes Two (2) times a day., Disp: 5 mL, Rfl: 1    omalizumab (XOLAIR) 150 mg/mL syringe, Inject the contents of 2 syringes (300 mg total) under the skin every twenty-eight (28) days., Disp: 2 mL, Rfl: 12    oxyCODONE-acetaminophen (PERCOCET) 10-325 mg per tablet, Take 1 tablet by mouth every six (6) hours as needed for pain. Max 4 tabs/day. Fill on or after: 11/08/21. Brand name only due to allergy., Disp: 120 tablet, Rfl: 0    [START ON 12/08/2021] oxyCODONE-acetaminophen (PERCOCET) 10-325 mg per tablet, Take 1 tablet by mouth every six (6) hours as needed for pain. Max 4 tabs/day. Fill on or after: 12/08/21. Brand name only due to allergy., Disp: 120 tablet, Rfl: 0    pantoprazole (PROTONIX) 40 MG tablet, TAKE 1 TABLET(40 MG) BY MOUTH DAILY, Disp: 30 tablet, Rfl: 4    potassium chloride 20 MEQ CR tablet, Take 1 tablet (20 mEq total) by mouth daily., Disp: 90 tablet, Rfl: 2    PROCHAMBER Spcr, , Disp: , Rfl:     rizatriptan (MAXALT-MLT) 10 MG disintegrating tablet, Take 1 tablet by mouth at ONSET of migraine or aura. May repeat in 2 HOURS if needed. DO NOT EXCEED 2 doses/day, 2 days/week, 8 doses/month., Disp: 10 tablet, Rfl: 5    rosuvastatin (CRESTOR) 10 MG tablet, TAKE 1 TABLET(10 MG) BY MOUTH EVERY NIGHT, Disp: 90 tablet, Rfl: 1    triamcinolone (KENALOG) 0.1 % cream, Apply 1 application topically two (2) times a day as needed. WHEN ALLERGIES FLARE-UP AND CAUSES RASH / ITCHING, Disp: 80 g, Rfl: 0    XHANCE 93 mcg/actuation AerB, 1 spray into each nostril two (2) times a day as needed., Disp: 16 mL, Rfl: 11    EPINEPHrine (EPIPEN) 0.3 mg/0.3 mL injection, Inject 0.3 mL (0.3 mg total) into the muscle once for 1 dose., Disp: 2 each, Rfl: 0     Allergies   Buprenorphine hcl, Pregabalin, Adhesive tape-silicones, Doxycycline hyclate (bulk), Keflex [cephalexin], Opioids - morphine analogues, and Oxycodone-acetaminophen    Medical History     Past Medical History:   Diagnosis Date    Allergic conjunctivitis 11/23/2018    Anemia 1986    Arthritis     Breast mass     Chondromalacia of left knee     Chondromalacia of  right knee     Depressive disorder     Fibrocystic breast     GERD (gastroesophageal reflux disease)     Hematuria     History of kidney stones     History of transfusion     Hyperlipidemia 1999    Hypothyroidism     Lumbar disc disease 1998    She was injured at work at the age of 5 and then had disk surgery 2 years later     Menopause ovarian failure     Menopause, premature     Migraine with aura     Neuromuscular disorder (CMS-HCC) 1999    Obesity (BMI 30-39.9) 11/23/2018    Ovarian cyst     Plantar fasciitis     Pseudotumor cerebri     Rash due to allergy 11/23/2018    Sickle cell trait (CMS-HCC)     Urinary tract infection     Vaginitis        Surgical History     Past Surgical History:   Procedure Laterality Date    BACK SURGERY  2001    BREAST BIOPSY Bilateral     When patient was 16 & 17 Both Negative    HYSTERECTOMY  2008    PR REMOVAL OF TONSILS,13+ Y/O Bilateral 10/10/2019    Procedure: TONSILLECTOMY;  Surgeon: Lona Millard, MD;  Location: OR [place];  Service: ENT    SALPINGOOPHORECTOMY Bilateral 2007    SPINE SURGERY  2001    TOTAL VAGINAL HYSTERECTOMY  01/04/2006    Uterine Prolapse-Dr. Leeanne Rio OP Note    TUBAL LIGATION  1995        Social History     Past Medical History:   Diagnosis Date    Allergic conjunctivitis 11/23/2018    Anemia 1986    Arthritis     Breast mass     Chondromalacia of left knee     Chondromalacia of right knee     Depressive disorder     Fibrocystic breast     GERD (gastroesophageal reflux disease)     Hematuria     History of kidney stones     History of transfusion     Hyperlipidemia 1999    Hypothyroidism     Lumbar disc disease 1998    She was injured at work at the age of 63 and then had disk surgery 2 years later     Menopause ovarian failure     Menopause, premature     Migraine with aura     Neuromuscular disorder (CMS-HCC) 1999    Obesity (BMI 30-39.9) 11/23/2018    Ovarian cyst     Plantar fasciitis     Pseudotumor cerebri     Rash due to allergy 11/23/2018    Sickle cell trait (CMS-HCC)     Urinary tract infection     Vaginitis         Family History     Family History   Problem Relation Age of Onset    Heart attack Father     Mental illness Father     Asthma Father     Hypertension Mother     Heart disease Mother     Arthritis Mother     Depression Mother     Drug abuse Mother     Mental illness Mother     Ulcers Mother     COPD Mother     Heart attack Maternal Grandmother     Heart  disease Maternal Grandmother     Hypertension Maternal Grandmother     Thyroid disease Maternal Grandmother     Breast cancer Cousin 64    Cancer Sister         lymphoma    COPD Maternal Aunt     Depression Son     Drug abuse Son     Mental illness Son     Stroke Paternal Grandfather     Thyroid disease Other     Alcohol abuse Neg Hx      Family Status   Relation Name Status    Father DJ Deceased    Mother Particia Nearing    [employer]/textile technologist (Not Specified)    Cousin  Deceased    Sister Marvis Repress (Not Specified)    Mat Aunt Amada Jupiter Deceased    Son Cloretta Ned (Not Specified)    PGF HV (Not Specified)    Other  (Not Specified)    Neg Hx  (Not Specified)       Physical Exam          11/19/21 0925   BP: 134/84   Pulse: 79   Weight: 70.3 kg (155 lb)   Height: 154.9 cm (5' 0.98)          Appearance: Well nourished/well developed.  Psychiatric: Mood and affect appropriate.    Head:  Normocephalic, atraumatic.   Skin:  Clean, dry, no lesions, no rashes.  Neuro:  Alert and oriented.  Respiratory: Unlabored.       FOCUSED EXAM:  Knee Exam:  ROM          Extension: 0          Flexion: 130  Effusion:           None   Palpation            Tenderness medial joint line, lateral patellar facet  Ligament Testing: Lachman's: Negative           Posterior Drawer: Negative           Collateral ligamentous instability: Negative   Meniscus Testing:          McMurray's: Positive     Patella Testing:          Patellar crepitus: Mild   Pain with patella grind          Patellar Tracking: Normal           Patellar Mobility: Normal     Hip:           Normal   Other Findings: Normal gait today.                                  Imaging       MRI of the left knee was independently reviewed today.  The MRI showed significant area of grade 4 chondral loss involving the posterior aspect of the medial femoral condyle.  There was significant subchondral cystic changes and bone marrow edema.  There was an increase signal within the undersurface of the medial meniscus with subtle undersurface tear.  Articular cartilage on the tibial plateau medially was intact.  Patellofemoral articular cartilage was preserved as was the lateral articular compartment cartilage.      Assessment & Plan     Diagnosis:   No diagnosis found.          Prescription:      Your Medication List  Accurate as of November 19, 2021  9:29 AM. If you have any questions, ask your nurse or doctor.                CONTINUE taking these medications      albuterol 90 mcg/actuation inhaler  Commonly known as: PROVENTIL HFA;VENTOLIN HFA  Inhale 2 puffs every four (4) hours as needed for wheezing.     amoxicillin 500 MG capsule  Commonly known as: AMOXIL  Take 1 capsule (500 mg total) by mouth Three (3) times a day for 10 days.     azelastine 137 mcg (0.1 %) nasal spray  Commonly known as: ASTELIN  2 sprays into each nostril Two (2) times a day.     azelastine 0.05 % ophthalmic solution  Commonly known as: OPTIVAR  Administer 2 drops to both eyes daily as needed.     baclofen 20 MG tablet  Commonly known as: LIORESAL  10 - 20 mg three times a day as needed for spasms.     cetirizine 10 MG tablet  Commonly known as: ZyrTEC     diclofenac sodium 20 mg/gram /actuation(2 %) Sopm  Commonly known as: PENNSAID  Apply 2 pumps to affected area up to four times a day.     EPINEPHrine 0.3 mg/0.3 mL injection  Commonly known as: EPIPEN  Inject 0.3 mL (0.3 mg total) into the muscle once for 1 dose.     estradioL 0.5 MG tablet  Commonly known as: ESTRACE  Take 1 tablet (0.5 mg total) by mouth daily.     gabapentin 800 MG tablet  Commonly known as: NEURONTIN  Take 1 tablet (800 mg total) by mouth Two (2) times a day.     hydroCHLOROthiazide 12.5 MG tablet  Commonly known as: HYDRODIURIL  TAKE 1 TABLET(12.5 MG) BY MOUTH DAILY AS NEEDED FOR SWELLING     naloxone 4 mg/actuation nasal spray  Commonly known as: NARCAN  One spray in either nostril once for known/suspected opioid overdose. May repeat every 2-3 minutes in alternating nostril til EMS arrives     nortriptyline 25 MG capsule  Commonly known as: PAMELOR  TAKE 1 CAPSULE(25 MG) BY MOUTH EVERY NIGHT     olopatadine 0.1 % ophthalmic solution  Commonly known as: PatanoL  Administer 1 drop to both eyes Two (2) times a day.     oxyCODONE-acetaminophen 10-325 mg per tablet  Commonly known as: PERCOCET  Take 1 tablet by mouth every six (6) hours as needed for pain. Max 4 tabs/day. Fill on or after: 11/08/21. Brand name only due to allergy.     oxyCODONE-acetaminophen 10-325 mg per tablet  Commonly known as: PERCOCET  Take 1 tablet by mouth every six (6) hours as needed for pain. Max 4 tabs/day. Fill on or after: 12/08/21. Brand name only due to allergy.  Start taking on: December 08, 2021     pantoprazole 40 MG tablet  Commonly known as: PROTONIX  TAKE 1 TABLET(40 MG) BY MOUTH DAILY     potassium chloride 20 MEQ ER tablet  Take 1 tablet (20 mEq total) by mouth daily.     PROCHAMBER Spcr  Generic drug: inhalational spacing device     rizatriptan 10 MG disintegrating tablet  Commonly known as: MAXALT-MLT  Take 1 tablet by mouth at ONSET of migraine or aura. May repeat in 2 HOURS if needed. DO NOT EXCEED 2 doses/day, 2 days/week, 8 doses/month. rosuvastatin 10 MG tablet  Commonly known as: CRESTOR  TAKE 1  TABLET(10 MG) BY MOUTH EVERY NIGHT     triamcinolone 0.1 % cream  Commonly known as: KENALOG  Apply 1 application topically two (2) times a day as needed. WHEN ALLERGIES FLARE-UP AND CAUSES RASH / ITCHING     XHANCE 93 mcg/actuation Aerb  Generic drug: fluticasone propionate  1 spray into each nostril two (2) times a day as needed.     XOLAIR 150 mg/mL syringe  Generic drug: omalizumab  Inject the contents of 2 syringes (300 mg total) under the skin every twenty-eight (28) days.              Plan: We had discussed prior knee arthroscopy to address the meniscus tear.  She would like to hold off on that for the time being.  We elected to proceed with a corticosteroid injection at this time to provide her with some relief.  She will decide further whether she wants to proceed with the knee arthroscopy in the future.  She also has had some symptoms consistent with trochanteric bursitis and was given a handout for a home exercise program.    Lg Joint Inj: L knee on 11/19/2021 9:30 AM  Indications: pain  Details: 22 G needle  Laterality: left  Location: knee  Medications: 20 mg triamcinolone acetonide 10 mg/mL; 5 mL bupivacaine (PF) 0.5 % (5 mg/mL)  Procedure, treatment alternatives, risks and benefits explained, specific risks discussed. Consent was given by the patient. Immediately prior to procedure a time out was called to verify the correct patient, procedure, equipment, support staff and site/side marked as required. Patient was prepped and draped in the usual sterile fashion.     Provider Attestation: The information documented by members of my medical care team was reviewed and verified for accuracy by me.      No follow-ups on file.    Reed Pandy Lett, CMA  Date: 11/19/2021  Time: 9:30 AM

## 2021-11-19 ENCOUNTER — Ambulatory Visit: Admit: 2021-11-19 | Discharge: 2021-11-20 | Payer: MEDICARE | Attending: Sports Medicine | Primary: Sports Medicine

## 2021-11-19 DIAGNOSIS — M170 Bilateral primary osteoarthritis of knee: Principal | ICD-10-CM

## 2021-11-19 DIAGNOSIS — S83232A Complex tear of medial meniscus, current injury, left knee, initial encounter: Principal | ICD-10-CM

## 2021-11-19 MED ADMIN — triamcinolone acetonide (KENALOG) injection 20 mg: 20 mg | INTRA_ARTICULAR | @ 14:00:00 | Stop: 2021-11-19

## 2021-11-19 MED ADMIN — bupivacaine (PF) (MARCAINE) 0.5 % (5 mg/mL) injection (PF) 5 mL: 5 mL | INTRA_ARTICULAR | @ 14:00:00 | Stop: 2021-11-19

## 2021-11-20 ENCOUNTER — Ambulatory Visit: Admit: 2021-11-20 | Discharge: 2021-11-21 | Payer: MEDICARE

## 2021-11-20 ENCOUNTER — Ambulatory Visit: Admit: 2021-11-20 | Discharge: 2021-11-21 | Payer: MEDICARE | Attending: Anesthesiology | Primary: Anesthesiology

## 2021-11-20 DIAGNOSIS — M7918 Myalgia, other site: Principal | ICD-10-CM

## 2021-11-20 MED ADMIN — bupivacaine HCl (MARCAINE) 0.25 % (2.5 mg/mL) injection 50 mg: 20 mL | @ 13:00:00 | Stop: 2021-11-20

## 2021-11-20 NOTE — Unmapped (Unsigned)
PATIENT: Natalie [name]      AGE: 51 y.o.     DOB: Jan 21, 1971    Medical Record Number:  [id number]            Date of Exam:  11/20/2021      Primary Care Provider:  Royal Hawthorn, MD       Chief Complaint     No chief complaint on file.       History Of Present Illness   Natalie [name] is a  51 y.o.  female who presents today for evaluation of left knee.  She has been complaining of pain in her knee for the past several years.  She has managed this with viscosupplementation as well as corticosteroid injections.  She underwent her last Visco supplementation nearly a year ago.  It was not quite as effective but she did get relief.  Her prior injections lasted about 2 years.  She complains of pain and stiffness in the knee.  The pain is achy and dull but can become sharp at times.  She is felt some discomfort going up and down stairs as well as pivoting.  She has used naproxen as well as Percocet at times to manage the symptoms.    Today's History Update 07/02/2021 - Patient presents for MRI results review of left knee.  She continues to complain of pain medially in the left knee.  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    Today's History Update 10/01/2021 - Patient presents for follow up of bilateral knees.  Her right knee is improved after the viscosupplementation however her left knee continues to be symptomatic.  She has been complaining of ongoing pain over the past 4 months or so in spite of corticosteroid injection as well as viscosupplementation.  She has noticed pain throughout the knee but notices specifically medially when she pivots on the knee and sits crosslegged.  Her prior MRI showed a tear of the medial meniscus as well as degeneration in the medial compartment    Today's History Update 11/19/2021 - Patient presents for follow up of bilateral knees.  Her low knee is the more symptomatic of the 2.  She noticed the onset of her symptoms 2 to 3 weeks ago.  She was pushing her brother in a wheelchair up and inclined.  She feels that this aggravated her knee.  She denies twisting her knee.  She complains of pain medial in location.  She has had some difficulty walking.  She has had a previous MRI demonstrating a meniscus tear as well as some degeneration in the medial compartment.  She has had some benefit with viscosupplementation but symptoms acutely worse at this point.    Today's History Update 11/23/2021 - Patient presents for follow up of right knee. ***     Home Medications     Current Outpatient Medications:     albuterol HFA 90 mcg/actuation inhaler, Inhale 2 puffs every four (4) hours as needed for wheezing., Disp: 25.5 g, Rfl: 0    amoxicillin (AMOXIL) 500 MG capsule, Take 1 capsule (500 mg total) by mouth Three (3) times a day for 10 days., Disp: 30 capsule, Rfl: 0    azelastine (ASTELIN) 137 mcg (0.1 %) nasal spray, 2 sprays into each nostril Two (2) times a day., Disp: 30 mL, Rfl: 12    azelastine (OPTIVAR) 0.05 % ophthalmic solution, Administer 2 drops to both eyes daily as needed., Disp: 6 mL, Rfl: 5  baclofen (LIORESAL) 20 MG tablet, 10 - 20 mg three times a day as needed for spasms., Disp: 90 tablet, Rfl: 2    cetirizine (ZYRTEC) 10 MG tablet, , Disp: , Rfl:     diclofenac sodium (PENNSAID) 20 mg/gram /actuation(2 %) sopm, Apply 2 pumps to affected area up to four times a day., Disp: 112 g, Rfl: 2    EPINEPHrine (EPIPEN) 0.3 mg/0.3 mL injection, Inject 0.3 mL (0.3 mg total) into the muscle once for 1 dose., Disp: 2 each, Rfl: 0    estradioL (ESTRACE) 0.5 MG tablet, Take 1 tablet (0.5 mg total) by mouth daily., Disp: 90 tablet, Rfl: 0    gabapentin (NEURONTIN) 800 MG tablet, Take 1 tablet (800 mg total) by mouth Two (2) times a day., Disp: 180 tablet, Rfl: 0    hydroCHLOROthiazide (HYDRODIURIL) 12.5 MG tablet, TAKE 1 TABLET(12.5 MG) BY MOUTH DAILY AS NEEDED FOR SWELLING, Disp: 30 tablet, Rfl: 2    naloxone (NARCAN) 4 mg nasal spray, One spray in either nostril once for known/suspected opioid overdose. May repeat every 2-3 minutes in alternating nostril til EMS arrives, Disp: 1 each, Rfl: 0    nortriptyline (PAMELOR) 25 MG capsule, TAKE 1 CAPSULE(25 MG) BY MOUTH EVERY NIGHT, Disp: 90 capsule, Rfl: 1    olopatadine (PATANOL) 0.1 % ophthalmic solution, Administer 1 drop to both eyes Two (2) times a day., Disp: 5 mL, Rfl: 1    omalizumab (XOLAIR) 150 mg/mL syringe, Inject the contents of 2 syringes (300 mg total) under the skin every twenty-eight (28) days., Disp: 2 mL, Rfl: 12    oxyCODONE-acetaminophen (PERCOCET) 10-325 mg per tablet, Take 1 tablet by mouth every six (6) hours as needed for pain. Max 4 tabs/day. Fill on or after: 11/08/21. Brand name only due to allergy., Disp: 120 tablet, Rfl: 0    [START ON 12/08/2021] oxyCODONE-acetaminophen (PERCOCET) 10-325 mg per tablet, Take 1 tablet by mouth every six (6) hours as needed for pain. Max 4 tabs/day. Fill on or after: 12/08/21. Brand name only due to allergy., Disp: 120 tablet, Rfl: 0    pantoprazole (PROTONIX) 40 MG tablet, TAKE 1 TABLET(40 MG) BY MOUTH DAILY, Disp: 30 tablet, Rfl: 4    potassium chloride 20 MEQ CR tablet, Take 1 tablet (20 mEq total) by mouth daily., Disp: 90 tablet, Rfl: 2    PROCHAMBER Spcr, , Disp: , Rfl:     rizatriptan (MAXALT-MLT) 10 MG disintegrating tablet, Take 1 tablet by mouth at ONSET of migraine or aura. May repeat in 2 HOURS if needed. DO NOT EXCEED 2 doses/day, 2 days/week, 8 doses/month., Disp: 10 tablet, Rfl: 5    rosuvastatin (CRESTOR) 10 MG tablet, TAKE 1 TABLET(10 MG) BY MOUTH EVERY NIGHT, Disp: 90 tablet, Rfl: 1    triamcinolone (KENALOG) 0.1 % cream, Apply 1 application topically two (2) times a day as needed. WHEN ALLERGIES FLARE-UP AND CAUSES RASH / ITCHING, Disp: 80 g, Rfl: 0    XHANCE 93 mcg/actuation AerB, 1 spray into each nostril two (2) times a day as needed., Disp: 16 mL, Rfl: 11  No current facility-administered medications for this visit. Allergies   Buprenorphine hcl, Pregabalin, Adhesive tape-silicones, Doxycycline hyclate (bulk), Keflex [cephalexin], Opioids - morphine analogues, and Oxycodone-acetaminophen    Medical History     Past Medical History:   Diagnosis Date    Allergic conjunctivitis 11/23/2018    Anemia 1986    Arthritis     Breast mass     Chondromalacia of left knee  Chondromalacia of right knee     Depressive disorder     Fibrocystic breast     GERD (gastroesophageal reflux disease)     Hematuria     History of kidney stones     History of transfusion     Hyperlipidemia 1999    Hypothyroidism     Lumbar disc disease 1998    She was injured at work at the age of 73 and then had disk surgery 2 years later     Menopause ovarian failure     Menopause, premature     Migraine with aura     Neuromuscular disorder (CMS-HCC) 1999    Obesity (BMI 30-39.9) 11/23/2018    Ovarian cyst     Plantar fasciitis     Pseudotumor cerebri     Rash due to allergy 11/23/2018    Sickle cell trait (CMS-HCC)     Urinary tract infection     Vaginitis        Surgical History     Past Surgical History:   Procedure Laterality Date    BACK SURGERY  2001    BREAST BIOPSY Bilateral     When patient was 16 & 17 Both Negative    HYSTERECTOMY  2008    PR REMOVAL OF TONSILS,13+ Y/O Bilateral 10/10/2019    Procedure: TONSILLECTOMY;  Surgeon: Lona Millard, MD;  [facility];  Service: ENT    SALPINGOOPHORECTOMY Bilateral 2007    SPINE SURGERY  2001    TOTAL VAGINAL HYSTERECTOMY  01/04/2006    Uterine Prolapse-Dr. Leeanne Rio OP Note    TUBAL LIGATION  1995        Social History     Past Medical History:   Diagnosis Date    Allergic conjunctivitis 11/23/2018    Anemia 1986    Arthritis     Breast mass     Chondromalacia of left knee     Chondromalacia of right knee     Depressive disorder     Fibrocystic breast     GERD (gastroesophageal reflux disease)     Hematuria     History of kidney stones     History of transfusion     Hyperlipidemia 1999 Hypothyroidism     Lumbar disc disease 1998    She was injured at work at the age of 15 and then had disk surgery 2 years later     Menopause ovarian failure     Menopause, premature     Migraine with aura     Neuromuscular disorder (CMS-HCC) 1999    Obesity (BMI 30-39.9) 11/23/2018    Ovarian cyst     Plantar fasciitis     Pseudotumor cerebri     Rash due to allergy 11/23/2018    Sickle cell trait (CMS-HCC)     Urinary tract infection     Vaginitis         Family History     Family History   Problem Relation Age of Onset    Heart attack Father     Mental illness Father     Asthma Father     Hypertension Mother     Heart disease Mother     Arthritis Mother     Depression Mother     Drug abuse Mother     Mental illness Mother     Ulcers Mother     COPD Mother     Heart attack Maternal Grandmother     Heart disease  Maternal Grandmother     Hypertension Maternal Grandmother     Thyroid disease Maternal Grandmother     Breast cancer Cousin 47    Cancer Sister         lymphoma    COPD Maternal Aunt     Depression Son     Drug abuse Son     Mental illness Son     Stroke Paternal Grandfather     Thyroid disease Other     Alcohol abuse Neg Hx      Family Status   Relation Name Status    Father DJ Deceased    Mother Particia Nearing    [employer]/textile technologist (Not Specified)    Cousin  Deceased    Sister Marvis Repress (Not Specified)    Mat Aunt Amada Jupiter Deceased    Son Cloretta Ned (Not Specified)    PGF HV (Not Specified)    Other  (Not Specified)    Neg Hx  (Not Specified)       Physical Exam     There were no vitals filed for this visit.         Appearance: Well nourished/well developed.  Psychiatric: Mood and affect appropriate.    Head:  Normocephalic, atraumatic.   Skin:  Clean, dry, no lesions, no rashes.  Neuro:  Alert and oriented.  Respiratory: Unlabored.       FOCUSED EXAM:  Knee Exam:  ROM          Extension: 0          Flexion: 130  Effusion:           None   Palpation            Tenderness medial joint line, lateral patellar facet  Ligament Testing:          Lachman's: Negative           Posterior Drawer: Negative           Collateral ligamentous instability: Negative   Meniscus Testing:          McMurray's: Positive     Patella Testing:          Patellar crepitus: Mild   Pain with patella grind          Patellar Tracking: Normal           Patellar Mobility: Normal     Hip:           Normal   Other Findings: Normal gait today.                                  Imaging       MRI of the left knee was independently reviewed today.  The MRI showed significant area of grade 4 chondral loss involving the posterior aspect of the medial femoral condyle.  There was significant subchondral cystic changes and bone marrow edema.  There was an increase signal within the undersurface of the medial meniscus with subtle undersurface tear.  Articular cartilage on the tibial plateau medially was intact.  Patellofemoral articular cartilage was preserved as was the lateral articular compartment cartilage.      Assessment & Plan     Diagnosis:   No diagnosis found.          Prescription:      Your Medication List            Accurate as of November 20, 2021  3:01 PM. If you have any questions, ask your nurse or doctor.                CONTINUE taking these medications      albuterol 90 mcg/actuation inhaler  Commonly known as: PROVENTIL HFA;VENTOLIN HFA  Inhale 2 puffs every four (4) hours as needed for wheezing.     azelastine 137 mcg (0.1 %) nasal spray  Commonly known as: ASTELIN  2 sprays into each nostril Two (2) times a day.     azelastine 0.05 % ophthalmic solution  Commonly known as: OPTIVAR  Administer 2 drops to both eyes daily as needed.     baclofen 20 MG tablet  Commonly known as: LIORESAL  10 - 20 mg three times a day as needed for spasms.     cetirizine 10 MG tablet  Commonly known as: ZyrTEC     diclofenac sodium 20 mg/gram /actuation(2 %) Sopm  Commonly known as: PENNSAID  Apply 2 pumps to affected area up to four times a day.     EPINEPHrine 0.3 mg/0.3 mL injection  Commonly known as: EPIPEN  Inject 0.3 mL (0.3 mg total) into the muscle once for 1 dose.     estradioL 0.5 MG tablet  Commonly known as: ESTRACE  Take 1 tablet (0.5 mg total) by mouth daily.     gabapentin 800 MG tablet  Commonly known as: NEURONTIN  Take 1 tablet (800 mg total) by mouth Two (2) times a day.     hydroCHLOROthiazide 12.5 MG tablet  Commonly known as: HYDRODIURIL  TAKE 1 TABLET(12.5 MG) BY MOUTH DAILY AS NEEDED FOR SWELLING     naloxone 4 mg/actuation nasal spray  Commonly known as: NARCAN  One spray in either nostril once for known/suspected opioid overdose. May repeat every 2-3 minutes in alternating nostril til EMS arrives     nortriptyline 25 MG capsule  Commonly known as: PAMELOR  TAKE 1 CAPSULE(25 MG) BY MOUTH EVERY NIGHT     olopatadine 0.1 % ophthalmic solution  Commonly known as: PatanoL  Administer 1 drop to both eyes Two (2) times a day.     oxyCODONE-acetaminophen 10-325 mg per tablet  Commonly known as: PERCOCET  Take 1 tablet by mouth every six (6) hours as needed for pain. Max 4 tabs/day. Fill on or after: 11/08/21. Brand name only due to allergy.     oxyCODONE-acetaminophen 10-325 mg per tablet  Commonly known as: PERCOCET  Take 1 tablet by mouth every six (6) hours as needed for pain. Max 4 tabs/day. Fill on or after: 12/08/21. Brand name only due to allergy.  Start taking on: December 08, 2021     pantoprazole 40 MG tablet  Commonly known as: PROTONIX  TAKE 1 TABLET(40 MG) BY MOUTH DAILY     potassium chloride 20 MEQ ER tablet  Take 1 tablet (20 mEq total) by mouth daily.     PROCHAMBER Spcr  Generic drug: inhalational spacing device     rizatriptan 10 MG disintegrating tablet  Commonly known as: MAXALT-MLT  Take 1 tablet by mouth at ONSET of migraine or aura. May repeat in 2 HOURS if needed. DO NOT EXCEED 2 doses/day, 2 days/week, 8 doses/month.     rosuvastatin 10 MG tablet  Commonly known as: CRESTOR  TAKE 1 TABLET(10 MG) BY MOUTH EVERY NIGHT     triamcinolone 0.1 % cream  Commonly known as: KENALOG  Apply 1 application topically two (2) times a day as needed.  WHEN ALLERGIES FLARE-UP AND CAUSES RASH / ITCHING     XHANCE 93 mcg/actuation Aerb  Generic drug: fluticasone propionate  1 spray into each nostril two (2) times a day as needed.     XOLAIR 150 mg/mL syringe  Generic drug: omalizumab  Inject the contents of 2 syringes (300 mg total) under the skin every twenty-eight (28) days.              Plan: We had discussed prior knee arthroscopy to address the meniscus tear.  She would like to hold off on that for the time being.  We elected to proceed with a corticosteroid injection at this time to provide her with some relief.  She will decide further whether she wants to proceed with the knee arthroscopy in the future.  She also has had some symptoms consistent with trochanteric bursitis and was given a handout for a home exercise program.    Procedures    No follow-ups on file.    Reed Pandy Keno Caraway, CMA  Date: 11/20/2021  Time: 3:01 PM

## 2021-11-20 NOTE — Unmapped (Addendum)
Trigger Point Injection of 3+ muscle groups: Bilateral cervical, thoracic and lumbar paraspinals, bilateral trapezius, rhomboids, and levator scapulae, bilateral quadratus and gluteus medius  Pre-operative diagnosis: Myofascial pain  Post-operative diagnosis: Same  Attending Physician: Dr. Criss Rosales  Assistant: Dr. Trinna Post Mykelle Cockerell     Brief HPI:  The patient is a 51 y.o. female with past medical history of pseudotumor cerebri, bilateral knee osteoarthritis, myofascial pain syndrome, L5-S1 spinal fusion in 2001, coccydynia. She presents today for treatment of her myofascial paracervical, shoulders, upper mid and lower back areas with repeat trigger point injections     10/27-Last TPI 09/01/21 with benefit.  Was seen in clinic and wanted to discuss neck pain and other possible procedures.  A cervical XRAY was ordered but she has not obtained.  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      Has serial consent from 07/16/21     After risks and benefits were explained including bleeding, infection, worsening of the pain, damage to the area being injected, weakness, allergic reaction to medications, vascular injection, pneumothorax and nerve damage, signed consent was obtained.  All questions were answered.   The patient is not taking antiplatelet or anticoagulation medications and does not have a driver today.     The area of the trigger point was identified and the skin prepped with chlorhexidine.  Next, a 25 gauge 1.5 inch needle was placed in the area of the trigger points in the lumbar paraspinals and the gluteus medius; a 27 gauge 0.5 inch needle was used for everything else above.  Dry needling was performed in the area of the trigger point.  Once reproduction of the pain was elicited and negative aspiration confirmed, the trigger point was injected with 0.5-1 mL of 0.25% bupivacaine and the needle removed.      The patient did tolerate the procedure well and there were not complications.       Trigger points injected: 30     Trigger point locations:  bilateral cervical, thoracic and lumbar paraspinals, bilateral trapezius, rhomboids, and levator scapulae, bilateral quadratus, and gluteus medius     The patient was monitored for 15 minutes after the procedure.  Vital signs remained normal and the patient ambulated out of clinic     Pre-procedure pain score: 7/10  Post-procedure pain score: 8/10     DISPO:   -obtain cervical XRAY, once resulted can determine if there are further procedures (MBB/RFA)  - Repeat TPI ordered for 6-8 weeks  - Goetzinger 11/27/21  - [place] 12/31/21     Orders Placed This Encounter   Procedures    Trigger Point Injs 3+ Muscles (16109)     TPIs, Dr. Elizbeth Squires mins, quad or spine center, no PIV, no driver needed   Has serial consent (from 07/16/21)   Not on anticoagulation   6-8 weeks     Standing Status:   Future     Standing Expiration Date:   11/21/2022       Answers submitted by the patient for this visit:  Back Pain Questionnaire (Submitted on 11/13/2021)  Chief Complaint: Back pain  Chronicity: chronic  Onset: more than 1 year ago  Frequency: constantly  Progression since onset: unchanged  Pain location: gluteal, lumbar spine, sacro-iliac, thoracic spine  Pain quality: aching, burning, cramping, shooting, stabbing  Radiates to: left foot, left knee, left thigh, right foot, right knee  Pain - numeric: 7/10  Pain is: the same all the time  Aggravated by: bending, coughing, position, lying down, sitting, standing, twisting  Stiffness is present: all day  abdominal pain: No  bladder incontinence: No  bowel incontinence: No  chest pain: No  dysuria: No  fever: No  headaches: Yes  leg pain: Yes  numbness: Yes  paresis: No  paresthesias: Yes  pelvic pain: No  perianal numbness: No  tingling: Yes  weakness: Yes  weight loss: No  Risk factors: menopause

## 2021-11-20 NOTE — Unmapped (Signed)
AVS is reviewed with patient and she verbalized understanding

## 2021-11-20 NOTE — Unmapped (Signed)
POST PROCEDURE INSTRUCTIONS   You may apply an ice pack 20-30 minutes at a time to the injection site if you experience soreness.     Keep the injection site clean and dry. You make remove the band-aid one day following the procedure.     You may take a shower but AVOID getting in to baths, pools or whirlpools for 48 HOURS AFTER THE PROCEDURE.       ACTIVITY   Refrain from heavy activity for the next 24 to 48 hours. General walking is okay. You may resume your normal activities the day following the procedure.   You may start or resume your individualized exercise program or physical therapy 48 hours after the procedure.   MEDICATIONS   Please note that it is okay to continue other prescribed medications (blood pressure, insulin, water pill, depression/anxiety pill, etc.) as well as other prescribed pain medications such as Neurontin, Lyrical, Celebrex, Ultram, Vicodin, Norco and acetaminophen (Tylenol).   SIDE EFFECTS   Increase in pain during the first 24 to 48 hours.     You might experience:   1. Mild to moderate swelling at the joint.   2. Possible bruising at the injection site.     WHEN TO CALL THE DOCTOR/NURSE   Severe pain, worse or different that the pain you had before the procedure.     Fever or chills.     Redness, or swelling around the injection site.     Call the Pain Management  Procedural nurses [phone],  during normal business hours (7 am-3 pm). If it is AFTER HOURS or during a weekend or holiday, call the hospital operator and ask for the Anesthesia Pain physician on call at [phone].   FOR EMERGENCIES, CALL 911 OR GO TO THE NEAREST HOSPITAL EMERGENCY DEPARTMENT.   ?   TO SCHEDULE APPOINTMENTS OR FOR QUESTIONS RELATED TO MEDICATIONS   Call the Pain Management Clinic at [phone]

## 2021-11-21 DIAGNOSIS — N951 Menopausal and female climacteric states: Principal | ICD-10-CM

## 2021-11-21 MED ORDER — ESTRADIOL 0.5 MG TABLET
ORAL_TABLET | 0 refills | 0 days
Start: 2021-11-21 — End: ?

## 2021-11-23 MED ORDER — ESTRADIOL 0.5 MG TABLET
ORAL_TABLET | 0 refills | 0 days
Start: 2021-11-23 — End: ?

## 2021-11-23 NOTE — Unmapped (Signed)
Unable to complete refill request, medication is not included in the refill protocol. .   Please advise on how to proceed.  Patient is requesting the following refill  Requested Prescriptions     Pending Prescriptions Disp Refills    estradioL (ESTRACE) 0.5 MG tablet [Pharmacy Med Name: ESTRADIOL 0.5MG  TABLETS] 30 tablet 0     Sig: TAKE 1 TABLET(0.5 MG) BY MOUTH DAILY       Recent Visits  Date Type Provider Dept   11/12/21 Telemedicine Neelagiri, Ardyth Man, MD [place] [facility]   10/20/21 Office Visit Neelagiri, Ardyth Man, MD [place] [facility]   08/13/21 Office Visit Leana Gamer, [place] [facility]   07/23/21 Office Visit Neelagiri, Ardyth Man, MD [place] [facility]   06/08/21 Office Visit Neelagiri, Ardyth Man, MD [place] [facility]   05/05/21 Telemedicine Neelagiri, Ardyth Man, MD [place] [facility]   04/15/21 Telemedicine Neelagiri, Ardyth Man, MD [place] [facility]   04/13/21 Office Visit Neelagiri, Ardyth Man, MD [place] [facility]   04/01/21 Office Visit Neelagiri, Ardyth Man, MD [place] [facility]   03/02/21 Office Visit Neelagiri, Ardyth Man, MD [place] [facility]   Showing recent visits within past 365 days with a meds authorizing provider and meeting all other requirements  Future Appointments  No visits were found meeting these conditions.  Showing future appointments within next 365 days with a meds authorizing provider and meeting all other requirements       Labs: Not applicable this refill

## 2021-11-24 DIAGNOSIS — N951 Menopausal and female climacteric states: Principal | ICD-10-CM

## 2021-11-24 MED ORDER — ESTRADIOL 0.5 MG TABLET
ORAL_TABLET | Freq: Every day | ORAL | 1 refills | 30 days | Status: CP
Start: 2021-11-24 — End: ?
  Filled 2021-11-24: qty 90, 90d supply, fill #0

## 2021-11-24 NOTE — Unmapped (Signed)
She was already referred to GYN for refill on her hormonal treatment during June 2023

## 2021-11-24 NOTE — Unmapped (Signed)
Patient called regarding this medication. Advised pt of pcp message regarding referral. Pt says no one has contacted her. I will be sending patient a mychart message with information where referral was sent. Patient wants to know if you can send in prescription for her til she gets in with GYN. Please advise

## 2021-11-25 MED ORDER — ESTRADIOL 0.5 MG TABLET
ORAL_TABLET | 0 refills | 0 days
Start: 2021-11-25 — End: ?

## 2021-11-25 NOTE — Unmapped (Signed)
Duplicate request

## 2021-11-25 NOTE — Unmapped (Signed)
Natalie [name] has been contacted in regards to their refill of Xolair. At this time, they have declined refill due to  clinic denied refill @ this time will follow-up in 3 weeks . Refill assessment call date has been updated per the patient's request.

## 2021-11-26 ENCOUNTER — Ambulatory Visit: Admit: 2021-11-26 | Discharge: 2021-11-27 | Payer: MEDICARE | Attending: Sports Medicine | Primary: Sports Medicine

## 2021-11-26 DIAGNOSIS — G8929 Other chronic pain: Principal | ICD-10-CM

## 2021-11-26 DIAGNOSIS — M25561 Pain in right knee: Principal | ICD-10-CM

## 2021-11-26 MED ADMIN — triamcinolone acetonide (KENALOG) injection 20 mg: 20 mg | INTRA_ARTICULAR | @ 18:00:00 | Stop: 2021-11-26

## 2021-11-26 MED ADMIN — bupivacaine (PF) (MARCAINE) 0.5 % (5 mg/mL) injection (PF) 5 mL: 5 mL | INTRA_ARTICULAR | @ 18:00:00 | Stop: 2021-11-26

## 2021-11-26 NOTE — Unmapped (Signed)
@  [email or web address]@    Post Steroid Injection Instructions    - It is possible to experience a flare-up of discomfort, swelling or redness around the injection site up to 72 hours after initial steroid injection.    - You may experience facial and neck flushing (sunburn or redness appearance of your skin). If this is bothersome you may take Benadryl    - It is recommended that you elevate the injected extremity and use ice as needed to help reduce the swelling and discomfort    - You may take an anti-inflammatory such as over the counter Ibuprofen, Motrin, or Advil, 600mg  three times a day with meals or Tylenol to help with any discomfort and swelling.    - If discomfort continues after 72 hours or you experience any fevers greater than 101.5, please contact our triage line at [phone] ext [phone]    - If you are a diabetic, you may experience an increase in your blood sugar levels post-injection. Please contact your Primary Care Physician for further recommendations.

## 2021-11-26 NOTE — Unmapped (Signed)
PATIENT: Natalie [name]      AGE: 51 y.o.     DOB: [birth year]    Medical Record Number:  [id number]            Date of Exam:  11/26/2021      Primary Care Provider:  Royal Hawthorn, MD       Chief Complaint     Chief Complaint   Patient presents with   ??? Right Knee - Follow-up        History Of Present Illness   Natalie [name] is a  51 y.o.  female who presents today for evaluation of left knee.  She has been complaining of pain in her knee for the past several years.  She has managed this with viscosupplementation as well as corticosteroid injections.  She underwent her last Visco supplementation nearly a year ago.  It was not quite as effective but she did get relief.  Her prior injections lasted about 2 years.  She complains of pain and stiffness in the knee.  The pain is achy and dull but can become sharp at times.  She is felt some discomfort going up and down stairs as well as pivoting.  She has used naproxen as well as Percocet at times to manage the symptoms.    Today's History Update 07/02/2021 - Patient presents for MRI results review of left knee.  She continues to complain of pain medially in the left knee.  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    Today's History Update 10/01/2021 - Patient presents for follow up of bilateral knees.  Her right knee is improved after the viscosupplementation however her left knee continues to be symptomatic.  She has been complaining of ongoing pain over the past 4 months or so in spite of corticosteroid injection as well as viscosupplementation.  She has noticed pain throughout the knee but notices specifically medially when she pivots on the knee and sits crosslegged.  Her prior MRI showed a tear of the medial meniscus as well as degeneration in the medial compartment    Today's History Update 11/19/2021 - Patient presents for follow up of bilateral knees.  Her low knee is the more symptomatic of the 2. She noticed the onset of her symptoms 2 to 3 weeks ago.  She was pushing her brother in a wheelchair up and inclined.  She feels that this aggravated her knee.  She denies twisting her knee.  She complains of pain medial in location.  She has had some difficulty walking.  She has had a previous MRI demonstrating a meniscus tear as well as some degeneration in the medial compartment.  She has had some benefit with viscosupplementation but symptoms acutely worse at this point.    Today's History Update 11/26/2021 - Patient presents for follow up of right knee.  She underwent a corticosteroid injection for the left knee which was helpful.  The right knee however has continued to be painful and felt would benefit from a corticosteroid injection.    Home Medications     Current Outpatient Medications:   ???  albuterol HFA 90 mcg/actuation inhaler, Inhale 2 puffs every four (4) hours as needed for wheezing., Disp: 25.5 g, Rfl: 0  ???  azelastine (ASTELIN) 137 mcg (0.1 %) nasal spray, 2 sprays into each nostril Two (2) times a day., Disp: 30 mL, Rfl: 12  ???  azelastine (OPTIVAR) 0.05 % ophthalmic solution, Administer 2 drops to both eyes daily  as needed., Disp: 6 mL, Rfl: 5  ???  baclofen (LIORESAL) 20 MG tablet, 10 - 20 mg three times a day as needed for spasms., Disp: 90 tablet, Rfl: 2  ???  cetirizine (ZYRTEC) 10 MG tablet, , Disp: , Rfl:   ???  diclofenac sodium (PENNSAID) 20 mg/gram /actuation(2 %) sopm, Apply 2 pumps to affected area up to four times a day., Disp: 112 g, Rfl: 2  ???  estradioL (ESTRACE) 0.5 MG tablet, Take 1 tablet (0.5 mg total) by mouth daily., Disp: 30 tablet, Rfl: 1  ???  gabapentin (NEURONTIN) 800 MG tablet, Take 1 tablet (800 mg total) by mouth Two (2) times a day., Disp: 180 tablet, Rfl: 0  ???  hydroCHLOROthiazide (HYDRODIURIL) 12.5 MG tablet, TAKE 1 TABLET(12.5 MG) BY MOUTH DAILY AS NEEDED FOR SWELLING, Disp: 30 tablet, Rfl: 2  ???  naloxone (NARCAN) 4 mg nasal spray, One spray in either nostril once for known/suspected opioid overdose. May repeat every 2-3 minutes in alternating nostril til EMS arrives, Disp: 1 each, Rfl: 0  ???  nortriptyline (PAMELOR) 25 MG capsule, TAKE 1 CAPSULE(25 MG) BY MOUTH EVERY NIGHT, Disp: 90 capsule, Rfl: 1  ???  olopatadine (PATANOL) 0.1 % ophthalmic solution, Administer 1 drop to both eyes Two (2) times a day., Disp: 5 mL, Rfl: 1  ???  omalizumab (XOLAIR) 150 mg/mL syringe, Inject the contents of 2 syringes (300 mg total) under the skin every twenty-eight (28) days., Disp: 2 mL, Rfl: 12  ???  oxyCODONE-acetaminophen (PERCOCET) 10-325 mg per tablet, Take 1 tablet by mouth every six (6) hours as needed for pain. Max 4 tabs/day. Fill on or after: 11/08/21. Brand name only due to allergy., Disp: 120 tablet, Rfl: 0  ???  [START ON 12/08/2021] oxyCODONE-acetaminophen (PERCOCET) 10-325 mg per tablet, Take 1 tablet by mouth every six (6) hours as needed for pain. Max 4 tabs/day. Fill on or after: 12/08/21. Brand name only due to allergy., Disp: 120 tablet, Rfl: 0  ???  pantoprazole (PROTONIX) 40 MG tablet, TAKE 1 TABLET(40 MG) BY MOUTH DAILY, Disp: 30 tablet, Rfl: 4  ???  potassium chloride 20 MEQ CR tablet, Take 1 tablet (20 mEq total) by mouth daily., Disp: 90 tablet, Rfl: 2  ???  PROCHAMBER Spcr, , Disp: , Rfl:   ???  rizatriptan (MAXALT-MLT) 10 MG disintegrating tablet, Take 1 tablet by mouth at ONSET of migraine or aura. May repeat in 2 HOURS if needed. DO NOT EXCEED 2 doses/day, 2 days/week, 8 doses/month., Disp: 10 tablet, Rfl: 5  ???  rosuvastatin (CRESTOR) 10 MG tablet, TAKE 1 TABLET(10 MG) BY MOUTH EVERY NIGHT, Disp: 90 tablet, Rfl: 1  ???  triamcinolone (KENALOG) 0.1 % cream, Apply 1 application topically two (2) times a day as needed. WHEN ALLERGIES FLARE-UP AND CAUSES RASH / ITCHING, Disp: 80 g, Rfl: 0  ???  XHANCE 93 mcg/actuation AerB, 1 spray into each nostril two (2) times a day as needed., Disp: 16 mL, Rfl: 11  ???  EPINEPHrine (EPIPEN) 0.3 mg/0.3 mL injection, Inject 0.3 mL (0.3 mg total) into the muscle once for 1 dose., Disp: 2 each, Rfl: 0     Allergies   Buprenorphine hcl, Pregabalin, Adhesive tape-silicones, Doxycycline hyclate (bulk), Keflex [cephalexin], Opioids - morphine analogues, and Oxycodone-acetaminophen    Medical History     Past Medical History:   Diagnosis Date   ??? Allergic conjunctivitis 11/23/2018   ??? Anemia 1986   ??? Arthritis    ??? Breast mass    ???  Chondromalacia of left knee    ??? Chondromalacia of right knee    ??? Depressive disorder    ??? Fibrocystic breast    ??? GERD (gastroesophageal reflux disease)    ??? Hematuria    ??? History of kidney stones    ??? History of transfusion    ??? Hyperlipidemia 1999   ??? Hypothyroidism    ??? Lumbar disc disease 1998    She was injured at work at the age of 89 and then had disk surgery 2 years later    ??? Menopause ovarian failure    ??? Menopause, premature    ??? Migraine with aura    ??? Neuromuscular disorder (CMS-HCC) 1999   ??? Obesity (BMI 30-39.9) 11/23/2018   ??? Ovarian cyst    ??? Plantar fasciitis    ??? Pseudotumor cerebri    ??? Rash due to allergy 11/23/2018   ??? Sickle cell trait (CMS-HCC)    ??? Urinary tract infection    ??? Vaginitis        Surgical History     Past Surgical History:   Procedure Laterality Date   ??? BACK SURGERY  2001   ??? BREAST BIOPSY Bilateral     When patient was 16 & 17 Both Negative   ??? HYSTERECTOMY  2008   ??? PR REMOVAL OF TONSILS,13+ Y/O Bilateral 10/10/2019    Procedure: TONSILLECTOMY;  Surgeon: Lona Millard, MD;  Location: OR [facility];  Service: ENT   ??? SALPINGOOPHORECTOMY Bilateral 2007   ??? SPINE SURGERY  2001   ??? TOTAL VAGINAL HYSTERECTOMY  01/04/2006    Uterine Prolapse-Dr. Leeanne Rio OP Note   ??? TUBAL LIGATION  1995        Social History     Past Medical History:   Diagnosis Date   ??? Allergic conjunctivitis 11/23/2018   ??? Anemia 1986   ??? Arthritis    ??? Breast mass    ??? Chondromalacia of left knee    ??? Chondromalacia of right knee    ??? Depressive disorder    ??? Fibrocystic breast    ??? GERD (gastroesophageal reflux disease)    ??? Hematuria    ??? History of kidney stones    ??? History of transfusion    ??? Hyperlipidemia 1999   ??? Hypothyroidism    ??? Lumbar disc disease 1998    She was injured at work at the age of 10 and then had disk surgery 2 years later    ??? Menopause ovarian failure    ??? Menopause, premature    ??? Migraine with aura    ??? Neuromuscular disorder (CMS-HCC) 1999   ??? Obesity (BMI 30-39.9) 11/23/2018   ??? Ovarian cyst    ??? Plantar fasciitis    ??? Pseudotumor cerebri    ??? Rash due to allergy 11/23/2018   ??? Sickle cell trait (CMS-HCC)    ??? Urinary tract infection    ??? Vaginitis         Family History     Family History   Problem Relation Age of Onset   ??? Heart attack Father    ??? Mental illness Father    ??? Asthma Father    ??? Hypertension Mother    ??? Heart disease Mother    ??? Arthritis Mother    ??? Depression Mother    ??? Drug abuse Mother    ??? Mental illness Mother    ??? Ulcers Mother    ??? COPD Mother    ???  Heart attack Maternal Grandmother    ??? Heart disease Maternal Grandmother    ??? Hypertension Maternal Grandmother    ??? Thyroid disease Maternal Grandmother    ??? Breast cancer Cousin 32   ??? Cancer Sister         lymphoma   ??? COPD Maternal Aunt    ??? Depression Son    ??? Drug abuse Son    ??? Mental illness Son    ??? Stroke Paternal Grandfather    ??? Thyroid disease Other    ??? Alcohol abuse Neg Hx      Family Status   Relation Name Status   ??? Father DJ Deceased   ??? Mother Particia Nearing   ??? MGM Pat (Not Specified)   ??? Cousin  Deceased   ??? Sister Marvis Repress (Not Specified)   ??? Mat Gaspar Bidding Deceased   ??? Son Cloretta Ned (Not Specified)   ??? PGF HV (Not Specified)   ??? Other  (Not Specified)   ??? Neg Hx  (Not Specified)       Physical Exam          11/26/21 1311   BP: 145/86   Pulse: 90   Weight: 70.3 kg (155 lb)   Height: 154.9 cm (5' 0.98)          Appearance: Well nourished/well developed.  Psychiatric: Mood and affect appropriate.    Head:  Normocephalic, atraumatic.   Skin:  Clean, dry, no lesions, no rashes.  Neuro:  Alert and oriented.  Respiratory: Unlabored.       FOCUSED EXAM:  Knee Exam:  Evaluation the right knee revealed no effusion.  There was some tenderness medial joint line and pain with patellar grind.  Ligamentously stable.  No pain with McMurray's test today.                              Imaging       MRI of the left knee was independently reviewed today.  The MRI showed significant area of grade 4 chondral loss involving the posterior aspect of the medial femoral condyle.  There was significant subchondral cystic changes and bone marrow edema.  There was an increase signal within the undersurface of the medial meniscus with subtle undersurface tear.  Articular cartilage on the tibial plateau medially was intact.  Patellofemoral articular cartilage was preserved as was the lateral articular compartment cartilage.      Assessment & Plan     Diagnosis:   No diagnosis found.          Prescription:      Your Medication List            Accurate as of November 26, 2021  1:16 PM. If you have any questions, ask your nurse or doctor.                CONTINUE taking these medications      albuterol 90 mcg/actuation inhaler  Commonly known as: PROVENTIL HFA;VENTOLIN HFA  Inhale 2 puffs every four (4) hours as needed for wheezing.     azelastine 137 mcg (0.1 %) nasal spray  Commonly known as: ASTELIN  2 sprays into each nostril Two (2) times a day.     azelastine 0.05 % ophthalmic solution  Commonly known as: OPTIVAR  Administer 2 drops to both eyes daily as needed.     baclofen 20 MG tablet  Commonly known as: LIORESAL  10 -  20 mg three times a day as needed for spasms.     cetirizine 10 MG tablet  Commonly known as: ZYRTEC     diclofenac sodium 20 mg/gram /actuation(2 %) Sopm  Commonly known as: PENNSAID  Apply 2 pumps to affected area up to four times a day.     EPINEPHrine 0.3 mg/0.3 mL injection  Commonly known as: EPIPEN  Inject 0.3 mL (0.3 mg total) into the muscle once for 1 dose.     estradioL 0.5 MG tablet  Commonly known as: ESTRACE  Take 1 tablet (0.5 mg total) by mouth daily.     gabapentin 800 MG tablet  Commonly known as: NEURONTIN  Take 1 tablet (800 mg total) by mouth Two (2) times a day.     hydroCHLOROthiazide 12.5 MG tablet  Commonly known as: HYDRODIURIL  TAKE 1 TABLET(12.5 MG) BY MOUTH DAILY AS NEEDED FOR SWELLING     naloxone 4 mg/actuation nasal spray  Commonly known as: NARCAN  One spray in either nostril once for known/suspected opioid overdose. May repeat every 2-3 minutes in alternating nostril til EMS arrives     nortriptyline 25 MG capsule  Commonly known as: PAMELOR  TAKE 1 CAPSULE(25 MG) BY MOUTH EVERY NIGHT     olopatadine 0.1 % ophthalmic solution  Commonly known as: PatanoL  Administer 1 drop to both eyes Two (2) times a day.     oxyCODONE-acetaminophen 10-325 mg per tablet  Commonly known as: PERCOCET  Take 1 tablet by mouth every six (6) hours as needed for pain. Max 4 tabs/day. Fill on or after: 11/08/21. Brand name only due to allergy.     oxyCODONE-acetaminophen 10-325 mg per tablet  Commonly known as: PERCOCET  Take 1 tablet by mouth every six (6) hours as needed for pain. Max 4 tabs/day. Fill on or after: 12/08/21. Brand name only due to allergy.  Start taking on: December 08, 2021     pantoprazole 40 MG tablet  Commonly known as: PROTONIX  TAKE 1 TABLET(40 MG) BY MOUTH DAILY     potassium chloride 20 MEQ ER tablet  Take 1 tablet (20 mEq total) by mouth daily.     PROCHAMBER Spcr  Generic drug: inhalational spacing device     rizatriptan 10 MG disintegrating tablet  Commonly known as: MAXALT-MLT  Take 1 tablet by mouth at ONSET of migraine or aura. May repeat in 2 HOURS if needed. DO NOT EXCEED 2 doses/day, 2 days/week, 8 doses/month.     rosuvastatin 10 MG tablet  Commonly known as: CRESTOR  TAKE 1 TABLET(10 MG) BY MOUTH EVERY NIGHT     triamcinolone 0.1 % cream  Commonly known as: KENALOG  Apply 1 application topically two (2) times a day as needed. WHEN ALLERGIES FLARE-UP AND CAUSES RASH / ITCHING XHANCE 93 mcg/actuation Aerb  Generic drug: fluticasone propionate  1 spray into each nostril two (2) times a day as needed.     XOLAIR 150 mg/mL syringe  Generic drug: omalizumab  Inject the contents of 2 syringes (300 mg total) under the skin every twenty-eight (28) days.              Plan: We discussed the findings today.  Her right knee has been symptomatic over the past couple of months as well.  We did not do specific x-rays of the knee today and I suspect that she does have some arthritic changes in this knee as well although not severe.  We elected to proceed with  a corticosteroid injection which did help her on the left side.  She will continue doing her home exercise program.    Lg Joint Inj: R knee on 11/26/2021 1:30 PM  Indications: pain  Details: 22 G needle  Laterality: right  Location: knee  Medications: 20 mg triamcinolone acetonide 10 mg/mL; 5 mL bupivacaine (PF) 0.5 % (5 mg/mL)  Procedure, treatment alternatives, risks and benefits explained, specific risks discussed. Consent was given by the patient. Immediately prior to procedure a time out was called to verify the correct patient, procedure, equipment, support staff and site/side marked as required. Patient was prepped and draped in the usual sterile fashion.     Provider Attestation: The information documented by members of my medical care team was reviewed and verified for accuracy by me.    No follow-ups on file.    Reed Pandy Lett, CMA  Date: 11/26/2021  Time: 1:16 PM

## 2021-11-26 NOTE — Unmapped (Signed)
Spoke with patient advising Rx was filled. Also advised PCP will no longer prescribe so it is best to reach back out to OBGYN and reschedule visit for future refills.

## 2021-11-27 ENCOUNTER — Telehealth: Admit: 2021-11-27 | Discharge: 2021-11-28 | Payer: MEDICARE | Attending: Psychologist | Primary: Psychologist

## 2021-11-27 DIAGNOSIS — F419 Anxiety disorder, unspecified: Principal | ICD-10-CM

## 2021-11-27 DIAGNOSIS — F1190 Opioid use, unspecified, uncomplicated: Principal | ICD-10-CM

## 2021-11-27 DIAGNOSIS — F4321 Adjustment disorder with depressed mood: Principal | ICD-10-CM
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
## 2021-12-07 ENCOUNTER — Telehealth: Admit: 2021-12-07 | Discharge: 2021-12-08 | Payer: MEDICARE | Attending: Psychologist | Primary: Psychologist

## 2021-12-07 DIAGNOSIS — F1190 Opioid use, unspecified, uncomplicated: Principal | ICD-10-CM

## 2021-12-07 DIAGNOSIS — F419 Anxiety disorder, unspecified: Principal | ICD-10-CM

## 2021-12-07 DIAGNOSIS — F4321 Adjustment disorder with depressed mood: Principal | ICD-10-CM

## 2021-12-07 DIAGNOSIS — M7918 Myalgia, other site: Principal | ICD-10-CM

## 2021-12-07 DIAGNOSIS — M5417 Radiculopathy, lumbosacral region: Principal | ICD-10-CM

## 2021-12-07 MED ORDER — GABAPENTIN 800 MG TABLET
ORAL_TABLET | 0 refills | 0 days
Start: 2021-12-07 — End: ?

## 2021-12-07 NOTE — Unmapped (Signed)
[facility]   Confidential Psychological Therapy Session    Patient Name: Natalie [name]  Medical Record Number: [id number]  Date of Service: December 07, 2021  Attending Psychologist: Caroline More, PhD  CPT Procedure Code: 81191 for 45 minutes of face to face counseling    This visit was performed face to face with interactive technology using a HIPPA compliant audio/visual platform. We reviewed confidentiality today. The patient was present in [place], a state in which this provider is licensed and able to provide care (location and contact information confirmed), attended this visit alone, and consented to this virtual pain psychology visit.    REFERRING PHYSICIAN: Criss Rosales, MD    CHIEF COMPLAINT AND REASON FOR REFERRAL:  COM follow up evaluation/pain coping skills; CBT/ACT for pain; grief    SUBJECTIVE / HISTORY OF PRESENT ILLNESS: [name] is a very pleasant 51 y.o.  female with chronic lower back pain, chronic headaches, and myofascial pain. Her pain started in 1999 in her lower back.  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  She had a spinal fusion in 2001 at the L5-S1 level.  She has had bilateral shoulder pain, neck pain, lower back pain, and b/l knees.  She also described a radiculopathy b/l that goes from her back to her bilateral buttocks and down to her toes.      Pt initially established with Dr. Oda Kilts in 08/2016 and then was was first evaluated by me in 07/2017. Last follow up with me was 11/27/21.  The patient discusses her stress and frustration with a recent legal predicament.  She was able to turn herself in and is out on bail.  She has a Clinical research associate.  She will fight the charge.  She particularly expresses frustration with her boyfriend's response.  She describes how she was there for him when his father died, and this was before they were dating, 2 months into their friendship.  Patient notes that he initially was very unsupportive, and now has been making emotionally supportive statements after practically being unsupportive initially.  The patient has not decided how she will proceed in terms of action, but she is trying to focus her time and energy on proactive and healthy test and coping for her.  She noted she felt lighter and having a greater sense of ease after expressing her frustrations today in session.  She reports good adherence to her pain management regimen.  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      OBJECTIVE / MENTAL STATUS:    Appearance:   Appears stated age and Clean/Neat   Motor:  No abnormal movements   Speech/Language:   Normal rate, volume, tone, fluency   Mood:  Euthymic, improved   Affect:  Bright! Mood congruent   Thought process:  Logical, linear, clear, coherent, goal directed   Thought content:    Denies SI, HI, self harm, delusions, obsessions, paranoid ideation, or ideas of reference   Perceptual disturbances:    Denies auditory and visual hallucinations, behavior not concerning for response to internal stimuli   Orientation:  Oriented to person, place, time, and general circumstances   Attention:  Able to fully attend without fluctuations in consciousness   Concentration:  Able to fully concentrate and attend   Memory:  Immediate, short-term, long-term, and recall grossly intact    Fund of knowledge:   Consistent with level of education and development  Insight:    Fair   Judgment:   Intact   Impulse Control:  Intact       DIAGNOSTIC IMPRESSION:   Anxiety NOS  Chronic continuou use of opiates  Grief    ASSESSMENT:   [name] is a very pleasant 50 y.o.  female from [place], [place] with chronic lower back pain, chronic headaches, and myofascial pain. Her pain started in 1999 in her lower back.  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  She had a spinal fusion in 2001 at the L5-S1 level.  She has had bilateral shoulder pain, neck pain, lower back pain, and b/l knees.  She also described a radiculopathy b/l that goes from her back to her bilateral buttocks and down to her toes.  The patient is currently considered to be high risk due to prior nonadherence, but appropriate with a behavioral adherence plan in place.  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    PLAN:   (1) COM - high risk but remains appropriate with a behavioral adherence plan also in place.    -Patient MUST meet with pain psychology/me once a month.   -No additional overuse of opioids will be tolerated.    -Patient should always bring her medication to clinic for pill counts.  -Patient was informed she has had numerous infractions with respect to her pain contract over the years, and that no further infractions will be tolerated.    If any additional pain contract or behavioral adherence contract infractions occur, I recommend stopping opioids.    Pt doing well with adherence, per our last few visits.     (2) Pain coping skills- addressed compliance, as well as pacing and mindful breathing, body scan, and problem solving    (3) follow-up with me next month for a virtual visit

## 2021-12-08 MED ORDER — GABAPENTIN 800 MG TABLET
ORAL_TABLET | 0 refills | 0 days | Status: CP
Start: 2021-12-08 — End: ?

## 2021-12-08 MED ORDER — OXYCODONE-ACETAMINOPHEN 10 MG-325 MG TABLET
ORAL_TABLET | Freq: Four times a day (QID) | ORAL | 0 refills | 30 days | Status: CP | PRN
Start: 2021-12-08 — End: ?

## 2021-12-11 NOTE — Unmapped (Signed)
Natalie [name] has been contacted in regards to their refill of Xolair. At this time, they have declined refill due to  clinic denied refill @ this time will follow-up in 3 weeks . Refill assessment call date has been updated per the patient's request.

## 2021-12-15 DIAGNOSIS — M5417 Radiculopathy, lumbosacral region: Principal | ICD-10-CM

## 2021-12-15 DIAGNOSIS — M7918 Myalgia, other site: Principal | ICD-10-CM

## 2021-12-15 MED ORDER — GABAPENTIN 800 MG TABLET
ORAL_TABLET | 0 refills | 0 days
Start: 2021-12-15 — End: ?

## 2021-12-19 DIAGNOSIS — G43019 Migraine without aura, intractable, without status migrainosus: Principal | ICD-10-CM

## 2021-12-19 DIAGNOSIS — G44221 Chronic tension-type headache, intractable: Principal | ICD-10-CM

## 2021-12-19 DIAGNOSIS — E7800 Pure hypercholesterolemia, unspecified: Principal | ICD-10-CM

## 2021-12-19 MED ORDER — NORTRIPTYLINE 25 MG CAPSULE
ORAL_CAPSULE | 1 refills | 0 days
Start: 2021-12-19 — End: ?

## 2021-12-19 MED ORDER — ROSUVASTATIN 10 MG TABLET
ORAL_TABLET | 1 refills | 0 days
Start: 2021-12-19 — End: ?

## 2021-12-21 MED ORDER — NORTRIPTYLINE 25 MG CAPSULE
ORAL_CAPSULE | 1 refills | 0 days
Start: 2021-12-21 — End: ?

## 2021-12-22 MED ORDER — ROSUVASTATIN 10 MG TABLET
ORAL_TABLET | 0 refills | 0 days | Status: CP
Start: 2021-12-22 — End: ?

## 2021-12-22 NOTE — Unmapped (Unsigned)
Patient called and left message on nurse line that she had to cancel her 12/7 appointment due to personal matters had to reschedule to 12/18 and she will be out of her Oxycodone-APAP 10-325 mg on 12/14 asking if we would be able to send in meds until her appointment.      Per PMP last filled 12/08/21

## 2021-12-23 NOTE — Unmapped (Signed)
Patient previously had an appointment with [place] prior to her refills running out however due to personal matters, she had to postpone her appointment. We will refill her opioid prescription for one month with close follow up prior to filling other prescriptions.     Requested Prescriptions     Signed Prescriptions Disp Refills    oxyCODONE-acetaminophen (PERCOCET) 10-325 mg per tablet 120 tablet 0     Sig: Take 1 tablet by mouth every six (6) hours as needed for pain. Max 4 tabs/day. Fill on or after: 01/07/22. Brand name only due to allergy.     Authorizing Provider: Knut Rondinelli P

## 2021-12-24 ENCOUNTER — Ambulatory Visit: Admit: 2021-12-24 | Discharge: 2021-12-25 | Payer: MEDICARE | Attending: Sports Medicine | Primary: Sports Medicine

## 2021-12-24 DIAGNOSIS — M7062 Trochanteric bursitis, left hip: Principal | ICD-10-CM

## 2021-12-24 DIAGNOSIS — M7061 Trochanteric bursitis, right hip: Principal | ICD-10-CM

## 2021-12-24 MED ADMIN — bupivacaine (PF) (MARCAINE) 0.5 % (5 mg/mL) injection (PF) 5 mL: 5 mL | INTRA_ARTICULAR | @ 16:00:00 | Stop: 2021-12-24

## 2021-12-24 MED ADMIN — triamcinolone acetonide (KENALOG) injection 20 mg: 20 mg | INTRA_ARTICULAR | @ 16:00:00 | Stop: 2021-12-24

## 2021-12-24 NOTE — Unmapped (Signed)
PATIENT: Natalie [name]      AGE: 51 y.o.     DOB: 1970/04/01    Medical Record Number:  [id number]            Date of Exam:  12/24/2021      Primary Care Provider:  Royal Hawthorn, MD       Chief Complaint     Chief Complaint   Patient presents with    Left Hip - Pain    Right Hip - Pain        History Of Present Illness   Natalie [name] is a  51 y.o.  female who presents today for evaluation of bilateral hips.  She states that she has been treated in the past for trochanteric bursitis.  She was last treated for her hips about 4 years ago and had corticosteroid injections.  She relates that she believes that her symptoms are related to chronic low back pain as well.  She complains of lateral sided hip pain.  This is exacerbated when she lays on her side at night.  She is treated pain clinic for lower back pain and takes Percocet for the pain.  This is not helped her hip substantially though.            Home Medications     Current Outpatient Medications:     albuterol HFA 90 mcg/actuation inhaler, Inhale 2 puffs every four (4) hours as needed for wheezing., Disp: 25.5 g, Rfl: 0    azelastine (ASTELIN) 137 mcg (0.1 %) nasal spray, 2 sprays into each nostril Two (2) times a day., Disp: 30 mL, Rfl: 12    azelastine (OPTIVAR) 0.05 % ophthalmic solution, Administer 2 drops to both eyes daily as needed., Disp: 6 mL, Rfl: 5    baclofen (LIORESAL) 20 MG tablet, 10 - 20 mg three times a day as needed for spasms., Disp: 90 tablet, Rfl: 2    cetirizine (ZYRTEC) 10 MG tablet, , Disp: , Rfl:     diclofenac sodium (PENNSAID) 20 mg/gram /actuation(2 %) sopm, Apply 2 pumps to affected area up to four times a day., Disp: 112 g, Rfl: 2    estradioL (ESTRACE) 0.5 MG tablet, Take 1 tablet (0.5 mg total) by mouth daily., Disp: 30 tablet, Rfl: 1    gabapentin (NEURONTIN) 800 MG tablet, TAKE 1 TABLET(800 MG) BY MOUTH TWICE DAILY, Disp: 180 tablet, Rfl: 0    hydroCHLOROthiazide (HYDRODIURIL) 12.5 MG tablet, TAKE 1 TABLET(12.5 MG) BY MOUTH DAILY AS NEEDED FOR SWELLING, Disp: 30 tablet, Rfl: 2    naloxone (NARCAN) 4 mg nasal spray, One spray in either nostril once for known/suspected opioid overdose. May repeat every 2-3 minutes in alternating nostril til EMS arrives, Disp: 1 each, Rfl: 0    nortriptyline (PAMELOR) 25 MG capsule, TAKE 1 CAPSULE(25 MG) BY MOUTH EVERY NIGHT, Disp: 90 capsule, Rfl: 1    olopatadine (PATANOL) 0.1 % ophthalmic solution, Administer 1 drop to both eyes Two (2) times a day., Disp: 5 mL, Rfl: 1    omalizumab (XOLAIR) 150 mg/mL syringe, Inject the contents of 2 syringes (300 mg total) under the skin every twenty-eight (28) days., Disp: 2 mL, Rfl: 12    oxyCODONE-acetaminophen (PERCOCET) 10-325 mg per tablet, Take 1 tablet by mouth every six (6) hours as needed for pain. Max 4 tabs/day. Fill on or after: 11/08/21. Brand name only due to allergy., Disp: 120 tablet, Rfl: 0    [START ON 01/07/2022] oxyCODONE-acetaminophen (PERCOCET) 10-325 mg per  tablet, Take 1 tablet by mouth every six (6) hours as needed for pain. Max 4 tabs/day. Fill on or after: 01/07/22. Brand name only due to allergy., Disp: 120 tablet, Rfl: 0    pantoprazole (PROTONIX) 40 MG tablet, TAKE 1 TABLET(40 MG) BY MOUTH DAILY, Disp: 30 tablet, Rfl: 4    potassium chloride 20 MEQ CR tablet, Take 1 tablet (20 mEq total) by mouth daily., Disp: 90 tablet, Rfl: 2    PROCHAMBER Spcr, , Disp: , Rfl:     rizatriptan (MAXALT-MLT) 10 MG disintegrating tablet, Take 1 tablet by mouth at ONSET of migraine or aura. May repeat in 2 HOURS if needed. DO NOT EXCEED 2 doses/day, 2 days/week, 8 doses/month., Disp: 10 tablet, Rfl: 5    rosuvastatin (CRESTOR) 10 MG tablet, TAKE 1 TABLET(10 MG) BY MOUTH EVERY NIGHT, Disp: 90 tablet, Rfl: 0    triamcinolone (KENALOG) 0.1 % cream, Apply 1 application topically two (2) times a day as needed. WHEN ALLERGIES FLARE-UP AND CAUSES RASH / ITCHING, Disp: 80 g, Rfl: 0    XHANCE 93 mcg/actuation AerB, 1 spray into each nostril two (2) times a day as needed., Disp: 16 mL, Rfl: 11    EPINEPHrine (EPIPEN) 0.3 mg/0.3 mL injection, Inject 0.3 mL (0.3 mg total) into the muscle once for 1 dose., Disp: 2 each, Rfl: 0     Allergies   Buprenorphine hcl, Pregabalin, Adhesive tape-silicones, Doxycycline hyclate (bulk), Keflex [cephalexin], Opioids - morphine analogues, and Oxycodone-acetaminophen    Medical History     Past Medical History:   Diagnosis Date    Allergic conjunctivitis 11/23/2018    Anemia 1986    Arthritis     Breast mass     Chondromalacia of left knee     Chondromalacia of right knee     Depressive disorder     Fibrocystic breast     GERD (gastroesophageal reflux disease)     Hematuria     History of kidney stones     History of transfusion     Hyperlipidemia 1999    Hypothyroidism     Lumbar disc disease 1998    She was injured at work at the age of 59 and then had disk surgery 2 years later     Menopause ovarian failure     Menopause, premature     Migraine with aura     Neuromuscular disorder (CMS-HCC) 1999    Obesity (BMI 30-39.9) 11/23/2018    Ovarian cyst     Plantar fasciitis     Pseudotumor cerebri     Rash due to allergy 11/23/2018    Sickle cell trait (CMS-HCC)     Urinary tract infection     Vaginitis        Surgical History     Past Surgical History:   Procedure Laterality Date    BACK SURGERY  2001    BREAST BIOPSY Bilateral     When patient was 16 & 17 Both Negative    HYSTERECTOMY  2008    PR REMOVAL OF TONSILS,13+ Y/O Bilateral 10/10/2019    Procedure: TONSILLECTOMY;  Surgeon: Lona Millard, MD;  Location: OR [facility];  Service: ENT    SALPINGOOPHORECTOMY Bilateral 2007    SPINE SURGERY  2001    TOTAL VAGINAL HYSTERECTOMY  01/04/2006    Uterine Prolapse-Dr. Leeanne Rio OP Note    TUBAL LIGATION  1995        Social History     Past Medical History:  Diagnosis Date    Allergic conjunctivitis 11/23/2018    Anemia 1986    Arthritis     Breast mass     Chondromalacia of left knee Chondromalacia of right knee     Depressive disorder     Fibrocystic breast     GERD (gastroesophageal reflux disease)     Hematuria     History of kidney stones     History of transfusion     Hyperlipidemia 1999    Hypothyroidism     Lumbar disc disease 1998    She was injured at work at the age of 2 and then had disk surgery 2 years later     Menopause ovarian failure     Menopause, premature     Migraine with aura     Neuromuscular disorder (CMS-HCC) 1999    Obesity (BMI 30-39.9) 11/23/2018    Ovarian cyst     Plantar fasciitis     Pseudotumor cerebri     Rash due to allergy 11/23/2018    Sickle cell trait (CMS-HCC)     Urinary tract infection     Vaginitis         Family History     Family History   Problem Relation Age of Onset    Heart attack Father     Mental illness Father     Asthma Father     Hypertension Mother     Heart disease Mother     Arthritis Mother     Depression Mother     Drug abuse Mother     Mental illness Mother     Ulcers Mother     COPD Mother     Heart attack Maternal Grandmother     Heart disease Maternal Grandmother     Hypertension Maternal Grandmother     Thyroid disease Maternal Grandmother     Breast cancer Cousin 86    Cancer Sister         lymphoma    COPD Maternal Aunt     Depression Son     Drug abuse Son     Mental illness Son     Stroke Paternal Grandfather     Thyroid disease Other     Alcohol abuse Neg Hx      Family Status   Relation Name Status    Father DJ Deceased    Mother Annice Pih Alive    [employer]/textile technologist (Not Specified)    Cousin  Deceased    Sister Marvis Repress (Not Specified)    Mat Aunt Amada Jupiter Deceased    Son Cloretta Ned (Not Specified)    PGF HV (Not Specified)    Other  (Not Specified)    Neg Hx  (Not Specified)       Physical Exam          12/24/21 1053   Weight: 70.3 kg (155 lb)   Height: 154.9 cm (5' 0.98)        Appearance: Well nourished/well developed.  Psychiatric: Mood and affect appropriate.    Head:  Normocephalic, atraumatic.   Skin:  Clean, dry, no lesions, no rashes.  Neuro:  Alert and oriented.  Respiratory: Unlabored.       FOCUSED EXAM: Evaluation of both hips reveals localized tenderness over the greater trochanter.  No significant pain with internal or external rotation of the hip joint.  She walked with a normal gait today.  No significant pain or weakness with resisted hip abduction.    Imaging  XR Hips Bilateral w Pelvis 3 or 4 views    Result Date: 12/24/2021  AP and lateral views of both hips were obtained today and independently reviewed.  The x-rays showed minimal degenerative change in the right hip.  The x-rays are otherwise unremarkable.       Assessment & Plan     Diagnosis:   1. Greater trochanteric bursitis of both hips          Prescription:      Your Medication List            Accurate as of December 24, 2021  4:50 PM. If you have any questions, ask your nurse or doctor.                CONTINUE taking these medications      albuterol 90 mcg/actuation inhaler  Commonly known as: PROVENTIL HFA;VENTOLIN HFA  Inhale 2 puffs every four (4) hours as needed for wheezing.     azelastine 137 mcg (0.1 %) nasal spray  Commonly known as: ASTELIN  2 sprays into each nostril Two (2) times a day.     azelastine 0.05 % ophthalmic solution  Commonly known as: OPTIVAR  Administer 2 drops to both eyes daily as needed.     baclofen 20 MG tablet  Commonly known as: LIORESAL  10 - 20 mg three times a day as needed for spasms.     cetirizine 10 MG tablet  Commonly known as: ZYRTEC     diclofenac sodium 20 mg/gram /actuation(2 %) Sopm  Commonly known as: PENNSAID  Apply 2 pumps to affected area up to four times a day.     EPINEPHrine 0.3 mg/0.3 mL injection  Commonly known as: EPIPEN  Inject 0.3 mL (0.3 mg total) into the muscle once for 1 dose.     estradioL 0.5 MG tablet  Commonly known as: ESTRACE  Take 1 tablet (0.5 mg total) by mouth daily.     gabapentin 800 MG tablet  Commonly known as: NEURONTIN  TAKE 1 TABLET(800 MG) BY MOUTH TWICE DAILY     hydroCHLOROthiazide 12.5 MG tablet  Commonly known as: HYDRODIURIL  TAKE 1 TABLET(12.5 MG) BY MOUTH DAILY AS NEEDED FOR SWELLING     naloxone 4 mg/actuation nasal spray  Commonly known as: NARCAN  One spray in either nostril once for known/suspected opioid overdose. May repeat every 2-3 minutes in alternating nostril til EMS arrives     nortriptyline 25 MG capsule  Commonly known as: PAMELOR  TAKE 1 CAPSULE(25 MG) BY MOUTH EVERY NIGHT     olopatadine 0.1 % ophthalmic solution  Commonly known as: PatanoL  Administer 1 drop to both eyes Two (2) times a day.     oxyCODONE-acetaminophen 10-325 mg per tablet  Commonly known as: PERCOCET  Take 1 tablet by mouth every six (6) hours as needed for pain. Max 4 tabs/day. Fill on or after: 11/08/21. Brand name only due to allergy.     oxyCODONE-acetaminophen 10-325 mg per tablet  Commonly known as: PERCOCET  Take 1 tablet by mouth every six (6) hours as needed for pain. Max 4 tabs/day. Fill on or after: 01/07/22. Brand name only due to allergy.  Start taking on: January 07, 2022     pantoprazole 40 MG tablet  Commonly known as: Protonix  TAKE 1 TABLET(40 MG) BY MOUTH DAILY     potassium chloride 20 MEQ ER tablet  Take 1 tablet (20 mEq total) by mouth daily.  PROCHAMBER Spcr  Generic drug: inhalational spacing device     rizatriptan 10 MG disintegrating tablet  Commonly known as: MAXALT-MLT  Take 1 tablet by mouth at ONSET of migraine or aura. May repeat in 2 HOURS if needed. DO NOT EXCEED 2 doses/day, 2 days/week, 8 doses/month.     rosuvastatin 10 MG tablet  Commonly known as: CRESTOR  TAKE 1 TABLET(10 MG) BY MOUTH EVERY NIGHT     triamcinolone 0.1 % cream  Commonly known as: KENALOG  Apply 1 application topically two (2) times a day as needed. WHEN ALLERGIES FLARE-UP AND CAUSES RASH / ITCHING     XHANCE 93 mcg/actuation Aerb  Generic drug: fluticasone propionate  1 spray into each nostril two (2) times a day as needed.     XOLAIR 150 mg/mL syringe  Generic drug: omalizumab  Inject the contents of 2 syringes (300 mg total) under the skin every twenty-eight (28) days.              Plan: We reviewed the findings today.  The patient requested bilateral corticosteroid injections and this was performed today.  She will continue to work on strengthening of her hip and core musculature.  We will see her back as needed for this.  Lg Joint Inj: bilateral greater trochanteric bursa on 12/24/2021 10:45 AM  Indications: pain  Details: 22 G needle  Laterality: bilateral  Location: hip  Medications (Right): 20 mg triamcinolone acetonide 10 mg/mL; 5 mL bupivacaine (PF) 0.5 % (5 mg/mL)  Medications (Left): 20 mg triamcinolone acetonide 10 mg/mL; 5 mL bupivacaine (PF) 0.5 % (5 mg/mL)  Procedure, treatment alternatives, risks and benefits explained, specific risks discussed. Consent was given by the patient. Immediately prior to procedure a time out was called to verify the correct patient, procedure, equipment, support staff and site/side marked as required. Patient was prepped and draped in the usual sterile fashion.     Provider Attestation: The information documented by members of my medical care team was reviewed and verified for accuracy by me.        No follow-ups on file.    Lorraine Lax, MD  Date: 12/24/2021  Time: 4:50 PM

## 2021-12-24 NOTE — Unmapped (Signed)
Unable to reach patient sent My Chart message.

## 2021-12-24 NOTE — Unmapped (Unsigned)
PATIENT: Natalie [name]      AGE: 51 y.o.     DOB: 1970-05-29    Medical Record Number:  [id number]            Date of Exam:  12/23/2021      Primary Care Provider:  Royal Hawthorn, MD       Chief Complaint   No chief complaint on file.       History Of Present Illness   Natalie [name] is a  51 y.o.  female who presents today for evaluation of bilateral hips. ***        Home Medications     Current Outpatient Medications:     albuterol HFA 90 mcg/actuation inhaler, Inhale 2 puffs every four (4) hours as needed for wheezing., Disp: 25.5 g, Rfl: 0    azelastine (ASTELIN) 137 mcg (0.1 %) nasal spray, 2 sprays into each nostril Two (2) times a day., Disp: 30 mL, Rfl: 12    azelastine (OPTIVAR) 0.05 % ophthalmic solution, Administer 2 drops to both eyes daily as needed., Disp: 6 mL, Rfl: 5    baclofen (LIORESAL) 20 MG tablet, 10 - 20 mg three times a day as needed for spasms., Disp: 90 tablet, Rfl: 2    cetirizine (ZYRTEC) 10 MG tablet, , Disp: , Rfl:     diclofenac sodium (PENNSAID) 20 mg/gram /actuation(2 %) sopm, Apply 2 pumps to affected area up to four times a day., Disp: 112 g, Rfl: 2    EPINEPHrine (EPIPEN) 0.3 mg/0.3 mL injection, Inject 0.3 mL (0.3 mg total) into the muscle once for 1 dose., Disp: 2 each, Rfl: 0    estradioL (ESTRACE) 0.5 MG tablet, Take 1 tablet (0.5 mg total) by mouth daily., Disp: 30 tablet, Rfl: 1    gabapentin (NEURONTIN) 800 MG tablet, TAKE 1 TABLET(800 MG) BY MOUTH TWICE DAILY, Disp: 180 tablet, Rfl: 0    hydroCHLOROthiazide (HYDRODIURIL) 12.5 MG tablet, TAKE 1 TABLET(12.5 MG) BY MOUTH DAILY AS NEEDED FOR SWELLING, Disp: 30 tablet, Rfl: 2    naloxone (NARCAN) 4 mg nasal spray, One spray in either nostril once for known/suspected opioid overdose. May repeat every 2-3 minutes in alternating nostril til EMS arrives, Disp: 1 each, Rfl: 0    nortriptyline (PAMELOR) 25 MG capsule, TAKE 1 CAPSULE(25 MG) BY MOUTH EVERY NIGHT, Disp: 90 capsule, Rfl: 1 olopatadine (PATANOL) 0.1 % ophthalmic solution, Administer 1 drop to both eyes Two (2) times a day., Disp: 5 mL, Rfl: 1    omalizumab (XOLAIR) 150 mg/mL syringe, Inject the contents of 2 syringes (300 mg total) under the skin every twenty-eight (28) days., Disp: 2 mL, Rfl: 12    oxyCODONE-acetaminophen (PERCOCET) 10-325 mg per tablet, Take 1 tablet by mouth every six (6) hours as needed for pain. Max 4 tabs/day. Fill on or after: 11/08/21. Brand name only due to allergy., Disp: 120 tablet, Rfl: 0    [START ON 01/07/2022] oxyCODONE-acetaminophen (PERCOCET) 10-325 mg per tablet, Take 1 tablet by mouth every six (6) hours as needed for pain. Max 4 tabs/day. Fill on or after: 01/07/22. Brand name only due to allergy., Disp: 120 tablet, Rfl: 0    pantoprazole (PROTONIX) 40 MG tablet, TAKE 1 TABLET(40 MG) BY MOUTH DAILY, Disp: 30 tablet, Rfl: 4    potassium chloride 20 MEQ CR tablet, Take 1 tablet (20 mEq total) by mouth daily., Disp: 90 tablet, Rfl: 2    PROCHAMBER Spcr, , Disp: , Rfl:     rizatriptan (MAXALT-MLT)  10 MG disintegrating tablet, Take 1 tablet by mouth at ONSET of migraine or aura. May repeat in 2 HOURS if needed. DO NOT EXCEED 2 doses/day, 2 days/week, 8 doses/month., Disp: 10 tablet, Rfl: 5    rosuvastatin (CRESTOR) 10 MG tablet, TAKE 1 TABLET(10 MG) BY MOUTH EVERY NIGHT, Disp: 90 tablet, Rfl: 0    triamcinolone (KENALOG) 0.1 % cream, Apply 1 application topically two (2) times a day as needed. WHEN ALLERGIES FLARE-UP AND CAUSES RASH / ITCHING, Disp: 80 g, Rfl: 0    XHANCE 93 mcg/actuation AerB, 1 spray into each nostril two (2) times a day as needed., Disp: 16 mL, Rfl: 11     Allergies   Buprenorphine hcl, Pregabalin, Adhesive tape-silicones, Doxycycline hyclate (bulk), Keflex [cephalexin], Opioids - morphine analogues, and Oxycodone-acetaminophen    Medical History     Past Medical History:   Diagnosis Date    Allergic conjunctivitis 11/23/2018    Anemia 1986    Arthritis     Breast mass Chondromalacia of left knee     Chondromalacia of right knee     Depressive disorder     Fibrocystic breast     GERD (gastroesophageal reflux disease)     Hematuria     History of kidney stones     History of transfusion     Hyperlipidemia 1999    Hypothyroidism     Lumbar disc disease 1998    She was injured at work at the age of 36 and then had disk surgery 2 years later     Menopause ovarian failure     Menopause, premature     Migraine with aura     Neuromuscular disorder (CMS-HCC) 1999    Obesity (BMI 30-39.9) 11/23/2018    Ovarian cyst     Plantar fasciitis     Pseudotumor cerebri     Rash due to allergy 11/23/2018    Sickle cell trait (CMS-HCC)     Urinary tract infection     Vaginitis        Surgical History     Past Surgical History:   Procedure Laterality Date    BACK SURGERY  2001    BREAST BIOPSY Bilateral     When patient was 16 & 17 Both Negative    HYSTERECTOMY  2008    PR REMOVAL OF TONSILS,13+ Y/O Bilateral 10/10/2019    Procedure: TONSILLECTOMY;  Surgeon: Lona Millard, MD;  Location: [facility];  Service: ENT    SALPINGOOPHORECTOMY Bilateral 2007    SPINE SURGERY  2001    TOTAL VAGINAL HYSTERECTOMY  01/04/2006    Uterine Prolapse-Dr. Leeanne Rio OP Note    TUBAL LIGATION  1995        Social History     Past Medical History:   Diagnosis Date    Allergic conjunctivitis 11/23/2018    Anemia 1986    Arthritis     Breast mass     Chondromalacia of left knee     Chondromalacia of right knee     Depressive disorder     Fibrocystic breast     GERD (gastroesophageal reflux disease)     Hematuria     History of kidney stones     History of transfusion     Hyperlipidemia 1999    Hypothyroidism     Lumbar disc disease 1998    She was injured at work at the age of 88 and then had disk surgery 2 years later  Menopause ovarian failure     Menopause, premature     Migraine with aura     Neuromuscular disorder (CMS-HCC) 1999    Obesity (BMI 30-39.9) 11/23/2018    Ovarian cyst     Plantar fasciitis Pseudotumor cerebri     Rash due to allergy 11/23/2018    Sickle cell trait (CMS-HCC)     Urinary tract infection     Vaginitis         Family History     Family History   Problem Relation Age of Onset    Heart attack Father     Mental illness Father     Asthma Father     Hypertension Mother     Heart disease Mother     Arthritis Mother     Depression Mother     Drug abuse Mother     Mental illness Mother     Ulcers Mother     COPD Mother     Heart attack Maternal Grandmother     Heart disease Maternal Grandmother     Hypertension Maternal Grandmother     Thyroid disease Maternal Grandmother     Breast cancer Cousin 47    Cancer Sister         lymphoma    COPD Maternal Aunt     Depression Son     Drug abuse Son     Mental illness Son     Stroke Paternal Grandfather     Thyroid disease Other     Alcohol abuse Neg Hx      Family Status   Relation Name Status    Father DJ Deceased    Mother Particia Nearing    [employer]/textile technologist (Not Specified)    Cousin  Deceased    Sister Marvis Repress (Not Specified)    Mat Aunt Amada Jupiter Deceased    Son Cloretta Ned (Not Specified)    PGF HV (Not Specified)    Other  (Not Specified)    Neg Hx  (Not Specified)       Physical Exam   There were no vitals filed for this visit.     Appearance: Well nourished/well developed.  Psychiatric: Mood and affect appropriate.    Head:  Normocephalic, atraumatic.   Skin:  Clean, dry, no lesions, no rashes.  Neuro:  Alert and oriented.  Respiratory: Unlabored.       FOCUSED EXAM:  ***    Imaging   No results found.      Assessment & Plan     Diagnosis: No diagnosis found.      Prescription:      Your Medication List            Accurate as of December 23, 2021  4:04 PM. If you have any questions, ask your nurse or doctor.                CONTINUE taking these medications      albuterol 90 mcg/actuation inhaler  Commonly known as: PROVENTIL HFA;VENTOLIN HFA  Inhale 2 puffs every four (4) hours as needed for wheezing.     azelastine 137 mcg (0.1 %) nasal spray  Commonly known as: ASTELIN  2 sprays into each nostril Two (2) times a day.     azelastine 0.05 % ophthalmic solution  Commonly known as: OPTIVAR  Administer 2 drops to both eyes daily as needed.     baclofen 20 MG tablet  Commonly known as: LIORESAL  10 - 20 mg three  times a day as needed for spasms.     cetirizine 10 MG tablet  Commonly known as: ZYRTEC     diclofenac sodium 20 mg/gram /actuation(2 %) Sopm  Commonly known as: PENNSAID  Apply 2 pumps to affected area up to four times a day.     EPINEPHrine 0.3 mg/0.3 mL injection  Commonly known as: EPIPEN  Inject 0.3 mL (0.3 mg total) into the muscle once for 1 dose.     estradioL 0.5 MG tablet  Commonly known as: ESTRACE  Take 1 tablet (0.5 mg total) by mouth daily.     gabapentin 800 MG tablet  Commonly known as: NEURONTIN  TAKE 1 TABLET(800 MG) BY MOUTH TWICE DAILY     hydroCHLOROthiazide 12.5 MG tablet  Commonly known as: HYDRODIURIL  TAKE 1 TABLET(12.5 MG) BY MOUTH DAILY AS NEEDED FOR SWELLING     naloxone 4 mg/actuation nasal spray  Commonly known as: NARCAN  One spray in either nostril once for known/suspected opioid overdose. May repeat every 2-3 minutes in alternating nostril til EMS arrives     nortriptyline 25 MG capsule  Commonly known as: PAMELOR  TAKE 1 CAPSULE(25 MG) BY MOUTH EVERY NIGHT     olopatadine 0.1 % ophthalmic solution  Commonly known as: PatanoL  Administer 1 drop to both eyes Two (2) times a day.     oxyCODONE-acetaminophen 10-325 mg per tablet  Commonly known as: PERCOCET  Take 1 tablet by mouth every six (6) hours as needed for pain. Max 4 tabs/day. Fill on or after: 11/08/21. Brand name only due to allergy.     oxyCODONE-acetaminophen 10-325 mg per tablet  Commonly known as: PERCOCET  Take 1 tablet by mouth every six (6) hours as needed for pain. Max 4 tabs/day. Fill on or after: 01/07/22. Brand name only due to allergy.  Start taking on: January 07, 2022     pantoprazole 40 MG tablet  Commonly known as: Protonix  TAKE 1 TABLET(40 MG) BY MOUTH DAILY potassium chloride 20 MEQ ER tablet  Take 1 tablet (20 mEq total) by mouth daily.     PROCHAMBER Spcr  Generic drug: inhalational spacing device     rizatriptan 10 MG disintegrating tablet  Commonly known as: MAXALT-MLT  Take 1 tablet by mouth at ONSET of migraine or aura. May repeat in 2 HOURS if needed. DO NOT EXCEED 2 doses/day, 2 days/week, 8 doses/month.     rosuvastatin 10 MG tablet  Commonly known as: CRESTOR  TAKE 1 TABLET(10 MG) BY MOUTH EVERY NIGHT     triamcinolone 0.1 % cream  Commonly known as: KENALOG  Apply 1 application topically two (2) times a day as needed. WHEN ALLERGIES FLARE-UP AND CAUSES RASH / ITCHING     XHANCE 93 mcg/actuation Aerb  Generic drug: fluticasone propionate  1 spray into each nostril two (2) times a day as needed.     XOLAIR 150 mg/mL syringe  Generic drug: omalizumab  Inject the contents of 2 syringes (300 mg total) under the skin every twenty-eight (28) days.              Plan: ***    No follow-ups on file.    Reed Pandy Allaina Brotzman, CMA  Date: 12/23/2021  Time: 4:04 PM

## 2021-12-30 NOTE — Unmapped (Signed)
Specialty Medication(s): Xolair    Natalie [name] has been dis-enrolled from the [facility] specialty pharmacy services due to  multiple unsuccessful outreach attempts by the clinic to schedule appt to receive Xolair injection .    Additional information provided to the patient: n/a    Natalie [name], PharmD  [facility] Specialty Pharmacist

## 2022-01-07 ENCOUNTER — Telehealth: Admit: 2022-01-07 | Discharge: 2022-01-08 | Payer: MEDICARE

## 2022-01-07 DIAGNOSIS — M1712 Unilateral primary osteoarthritis, left knee: Principal | ICD-10-CM

## 2022-01-07 DIAGNOSIS — M1711 Unilateral primary osteoarthritis, right knee: Principal | ICD-10-CM

## 2022-01-07 DIAGNOSIS — B9689 Other specified bacterial agents as the cause of diseases classified elsewhere: Principal | ICD-10-CM

## 2022-01-07 DIAGNOSIS — M7061 Trochanteric bursitis, right hip: Principal | ICD-10-CM

## 2022-01-07 DIAGNOSIS — J0111 Acute recurrent frontal sinusitis: Principal | ICD-10-CM

## 2022-01-07 DIAGNOSIS — N760 Acute vaginitis: Principal | ICD-10-CM

## 2022-01-07 MED ORDER — AZITHROMYCIN 250 MG TABLET
ORAL_TABLET | Freq: Every day | ORAL | 0 refills | 6 days | Status: CP
Start: 2022-01-07 — End: 2022-01-12

## 2022-01-07 MED ORDER — METRONIDAZOLE 500 MG TABLET
ORAL_TABLET | Freq: Two times a day (BID) | ORAL | 0 refills | 7 days | Status: CP
Start: 2022-01-07 — End: 2022-01-14

## 2022-01-07 MED ORDER — DICLOFENAC 20 MG/GRAM/ACTUATION (2 %) TOPICAL SOLN METERED-DOSE PUMP
2 refills | 0 days | Status: CP
Start: 2022-01-07 — End: ?

## 2022-01-07 MED ORDER — OXYCODONE-ACETAMINOPHEN 10 MG-325 MG TABLET
ORAL_TABLET | Freq: Four times a day (QID) | ORAL | 0 refills | 30 days | Status: CP | PRN
Start: 2022-01-07 — End: ?

## 2022-01-08 ENCOUNTER — Telehealth: Admit: 2022-01-08 | Discharge: 2022-01-08 | Payer: MEDICARE | Attending: Psychologist | Primary: Psychologist

## 2022-01-08 ENCOUNTER — Ambulatory Visit: Admit: 2022-01-08 | Discharge: 2022-01-08 | Payer: MEDICARE | Attending: Anesthesiology | Primary: Anesthesiology

## 2022-01-08 DIAGNOSIS — F1190 Opioid use, unspecified, uncomplicated: Principal | ICD-10-CM

## 2022-01-08 DIAGNOSIS — F419 Anxiety disorder, unspecified: Principal | ICD-10-CM

## 2022-01-08 DIAGNOSIS — F4321 Adjustment disorder with depressed mood: Principal | ICD-10-CM

## 2022-01-08 DIAGNOSIS — M7918 Myalgia, other site: Principal | ICD-10-CM

## 2022-01-11 ENCOUNTER — Ambulatory Visit: Admit: 2022-01-11 | Discharge: 2022-01-12 | Payer: MEDICARE | Attending: Anesthesiology | Primary: Anesthesiology

## 2022-01-11 DIAGNOSIS — M5417 Radiculopathy, lumbosacral region: Principal | ICD-10-CM

## 2022-01-11 DIAGNOSIS — G894 Chronic pain syndrome: Principal | ICD-10-CM

## 2022-01-11 DIAGNOSIS — Z0289 Encounter for other administrative examinations: Principal | ICD-10-CM

## 2022-01-11 DIAGNOSIS — M7918 Myalgia, other site: Principal | ICD-10-CM

## 2022-01-11 MED ORDER — GABAPENTIN 800 MG TABLET
ORAL_TABLET | Freq: Two times a day (BID) | ORAL | 0 refills | 90 days | Status: CP
Start: 2022-01-11 — End: ?

## 2022-01-11 MED ORDER — BACLOFEN 20 MG TABLET
ORAL_TABLET | 2 refills | 0 days | Status: CP
Start: 2022-01-11 — End: ?

## 2022-01-15 DIAGNOSIS — G44221 Chronic tension-type headache, intractable: Principal | ICD-10-CM

## 2022-01-15 DIAGNOSIS — G43019 Migraine without aura, intractable, without status migrainosus: Principal | ICD-10-CM

## 2022-01-15 MED ORDER — NORTRIPTYLINE 25 MG CAPSULE
ORAL_CAPSULE | 1 refills | 0 days
Start: 2022-01-15 — End: ?

## 2022-01-18 DIAGNOSIS — R600 Localized edema: Principal | ICD-10-CM

## 2022-01-18 MED ORDER — HYDROCHLOROTHIAZIDE 12.5 MG TABLET
ORAL_TABLET | 2 refills | 0 days
Start: 2022-01-18 — End: ?

## 2022-01-19 MED ORDER — NORTRIPTYLINE 25 MG CAPSULE
ORAL_CAPSULE | 1 refills | 0 days
Start: 2022-01-19 — End: ?

## 2022-01-19 MED ORDER — HYDROCHLOROTHIAZIDE 12.5 MG TABLET
ORAL_TABLET | 5 refills | 0 days | Status: CP
Start: 2022-01-19 — End: ?

## 2022-01-20 ENCOUNTER — Ambulatory Visit: Admit: 2022-01-20 | Discharge: 2022-01-21 | Payer: MEDICARE

## 2022-01-20 DIAGNOSIS — H1013 Acute atopic conjunctivitis, bilateral: Principal | ICD-10-CM

## 2022-01-20 DIAGNOSIS — R070 Pain in throat: Principal | ICD-10-CM

## 2022-01-20 DIAGNOSIS — J3089 Other allergic rhinitis: Principal | ICD-10-CM

## 2022-01-20 MED ORDER — XHANCE 93 MCG/ACTUATION BREATH ACTIVATED AEROSOL
Freq: Two times a day (BID) | NASAL | 11 refills | 0 days | Status: CP | PRN
Start: 2022-01-20 — End: ?

## 2022-01-20 MED ORDER — AZELASTINE 0.05 % EYE DROPS
Freq: Every day | OPHTHALMIC | 5 refills | 60 days | Status: CP | PRN
Start: 2022-01-20 — End: ?

## 2022-02-05 ENCOUNTER — Ambulatory Visit: Admit: 2022-02-05 | Discharge: 2022-02-06 | Payer: MEDICARE | Attending: Anesthesiology | Primary: Anesthesiology

## 2022-02-05 DIAGNOSIS — M47812 Spondylosis without myelopathy or radiculopathy, cervical region: Principal | ICD-10-CM

## 2022-02-05 DIAGNOSIS — M542 Cervicalgia: Principal | ICD-10-CM

## 2022-02-06 MED ORDER — OXYCODONE-ACETAMINOPHEN 10 MG-325 MG TABLET
ORAL_TABLET | Freq: Four times a day (QID) | ORAL | 0 refills | 30 days | Status: CP | PRN
Start: 2022-02-06 — End: ?

## 2022-02-09 ENCOUNTER — Ambulatory Visit: Admit: 2022-02-09 | Discharge: 2022-02-10 | Payer: MEDICARE | Attending: Internal Medicine | Primary: Internal Medicine

## 2022-02-09 DIAGNOSIS — L508 Other urticaria: Principal | ICD-10-CM

## 2022-02-09 MED ORDER — EPINEPHRINE 0.3 MG/0.3 ML INJECTION, AUTO-INJECTOR
Freq: Once | INTRAMUSCULAR | 12 refills | 0 days | Status: CP | PRN
Start: 2022-02-09 — End: ?

## 2022-02-10 DIAGNOSIS — L501 Idiopathic urticaria: Principal | ICD-10-CM

## 2022-02-10 MED ORDER — XOLAIR 150 MG/ML SUBCUTANEOUS SYRINGE
SUBCUTANEOUS | 11 refills | 28 days | Status: CP
Start: 2022-02-10 — End: ?
  Filled 2022-02-23: qty 2, 28d supply, fill #0

## 2022-02-12 ENCOUNTER — Telehealth: Admit: 2022-02-12 | Discharge: 2022-02-13 | Payer: MEDICARE | Attending: Psychologist | Primary: Psychologist

## 2022-02-12 DIAGNOSIS — F1190 Opioid use, unspecified, uncomplicated: Principal | ICD-10-CM

## 2022-02-12 DIAGNOSIS — F4321 Adjustment disorder with depressed mood: Principal | ICD-10-CM

## 2022-02-12 DIAGNOSIS — F419 Anxiety disorder, unspecified: Principal | ICD-10-CM
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
## 2022-02-12 NOTE — Unmapped (Signed)
This patient is receiving Xolair as a clinic administered medication. Pharmacist reviewed the prescription and the patient's chart and determined that therapy is appropriate.  Patient is: not contacted as copay is $0. All future communication to occur between the [facility] and the clinic. This patient is being disenrolled from our specialty management program and will be added to a patient list for appropriate follow up.      [facility] Administered Medication Refill Coordination Note    NAME:Natalie [name] DOB: 1970/12/28    Medication: Xolair    Day Supply: 28 days      SHIPPING    Next delivery from [facility] [phone]) to [facility] for [name] is scheduled for 02/24/22.    Clinic contact: [name], CMA    Patient's next nurse visit for administration: 2/13.    We will follow up with clinic monthly for standard refill processing and delivery.      Oliva Bustard, PharmD  Specialty Pharmacy Pharmacist

## 2022-02-12 NOTE — Unmapped (Signed)
[facility] Specialty Medication Onboarding    Specialty Medication: Xolair  Prior Authorization: Not Required   Financial Assistance: No - copay  <$25  Final Copay/Day Supply: $0 / 28    Insurance Restrictions: None     Notes to Pharmacist: None    The triage team has completed the benefits investigation and has determined that the patient is able to fill this medication at [facility]. Please contact the patient to complete the onboarding or follow up with the prescribing physician as needed.

## 2022-02-23 ENCOUNTER — Ambulatory Visit: Admit: 2022-02-23 | Payer: MEDICARE

## 2022-02-23 ENCOUNTER — Telehealth: Admit: 2022-02-23 | Discharge: 2022-02-24 | Payer: MEDICARE | Attending: Psychologist | Primary: Psychologist

## 2022-02-23 DIAGNOSIS — F419 Anxiety disorder, unspecified: Principal | ICD-10-CM

## 2022-02-23 DIAGNOSIS — F1190 Opioid use, unspecified, uncomplicated: Principal | ICD-10-CM

## 2022-02-23 DIAGNOSIS — F4321 Adjustment disorder with depressed mood: Principal | ICD-10-CM

## 2022-02-23 NOTE — Unmapped (Signed)
[facility]   Confidential Psychological Therapy Session    Patient Name: Natalie [name]  Medical Record Number: [id number]  Date of Service: February 23, 2022  Attending Psychologist: Caroline More, PhD  CPT Procedure Code: 19147 for 60 minutes of face to face counseling    This visit was performed face to face with interactive technology using a HIPPA compliant audio/visual platform. We reviewed confidentiality today. The patient was present in [place], a state in which this provider is licensed and able to provide care (location and contact information confirmed), attended this visit alone, and consented to this virtual pain psychology visit.    REFERRING PHYSICIAN: Criss Rosales, MD    CHIEF COMPLAINT AND REASON FOR REFERRAL:  COM follow up evaluation/pain coping skills; CBT/ACT for pain; grief    SUBJECTIVE / HISTORY OF PRESENT ILLNESS: [name] is a very pleasant 52 y.o.  female with chronic lower back pain, chronic headaches, and myofascial pain. Her pain started in 1999 in her lower back.  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  She had a spinal fusion in 2001 at the L5-S1 level.  She has had bilateral shoulder pain, neck pain, lower back pain, and b/l knees.  She also described a radiculopathy b/l that goes from her back to her bilateral buttocks and down to her toes.      Pt initially established with Dr. Oda Kilts in 08/2016 and then was was first evaluated by me in 07/2017. Last follow up with me was 02/12/22.  She was supposed to have a court date earlier this week, to review the custody situation, and she attended but the judge continued the case.  She is not sure when the next court hearing will be.  She processes thoughts and feelings about this frustration, and about her role in the family.  She discusses how stress impacts her body, including physiological agitation as well as muscle tightness. 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  She discusses how her work also was connected to [place], and how that she needs thoughts about herself in the holistic way.      Spent extra time reattaching, pacing and activity rest cycling.  Also reviewed adherence.  She reports good adherence to her pain management regimen.  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      OBJECTIVE / MENTAL STATUS:    Appearance:   Appears stated age and Clean/Neat   Motor:  No abnormal movements   Speech/Language:   Normal rate, volume, tone, fluency   Mood:  Euthymic, improved   Affect:  Bright! Mood congruent   Thought process:  Logical, linear, clear, coherent, goal directed   Thought content:    Denies SI, HI, self harm, delusions, obsessions, paranoid ideation, or ideas of reference   Perceptual disturbances:    Denies auditory and visual hallucinations, behavior not concerning for response to internal stimuli   Orientation:  Oriented to person, place, time, and general circumstances   Attention:  Able to fully attend without fluctuations in consciousness   Concentration:  Able to fully concentrate and attend   Memory:  Immediate, short-term, long-term, and recall grossly intact    Fund of knowledge:   Consistent with level of education and development   Insight:    Fair   Judgment:   Intact   Impulse Control:  Intact       DIAGNOSTIC IMPRESSION:   Anxiety NOS  Chronic continuou use of opiates  Grief    ASSESSMENT:   [name] is a very pleasant 52 y.o.  female from [name], [place] with chronic lower back pain, chronic headaches, and myofascial pain. Her pain started in 1999 in her lower back.  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  She had a spinal fusion in 2001 at the L5-S1 level.  She has had bilateral shoulder pain, neck pain, lower back pain, and b/l knees.  She also described a radiculopathy b/l that goes from her back to her bilateral buttocks and down to her toes.  The patient is currently considered to be high risk due to prior nonadherence, but appropriate with a behavioral adherence plan in place.  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 She continues to participate actively in psychotherapy.    PLAN:   (1) COM - high risk but remains appropriate with a behavioral adherence plan also in place.    -Patient MUST meet with pain psychology/me once a month.   -No additional overuse of opioids will be tolerated.    -Patient should always bring her medication to clinic for pill counts.  -Patient was informed she has had numerous infractions with respect to her pain contract over the years, and that no further infractions will be tolerated.    If any additional pain contract or behavioral adherence contract infractions occur, I recommend stopping opioids.    Pt doing well with adherence, per our last few visits.     (2) Pain coping skills- addressed compliance, as well as pacing and mindful breathing, body scan, and problem solving    (3) follow-in 1 month

## 2022-02-24 ENCOUNTER — Telehealth: Admit: 2022-02-24 | Discharge: 2022-02-25 | Payer: MEDICARE

## 2022-02-24 DIAGNOSIS — N898 Other specified noninflammatory disorders of vagina: Principal | ICD-10-CM

## 2022-02-24 DIAGNOSIS — R1032 Left lower quadrant pain: Principal | ICD-10-CM

## 2022-02-24 DIAGNOSIS — J3089 Other allergic rhinitis: Principal | ICD-10-CM

## 2022-02-24 DIAGNOSIS — G43019 Migraine without aura, intractable, without status migrainosus: Principal | ICD-10-CM

## 2022-02-24 MED ORDER — RIZATRIPTAN 10 MG DISINTEGRATING TABLET
ORAL_TABLET | 5 refills | 0 days | Status: CP
Start: 2022-02-24 — End: ?

## 2022-02-24 MED ORDER — MONTELUKAST 10 MG TABLET
ORAL_TABLET | Freq: Every evening | ORAL | 2 refills | 30 days | Status: CP
Start: 2022-02-24 — End: 2023-02-24

## 2022-02-24 MED ORDER — PREDNISONE 20 MG TABLET
ORAL_TABLET | Freq: Every day | ORAL | 0 refills | 4 days | Status: CP
Start: 2022-02-24 — End: ?

## 2022-02-24 NOTE — Unmapped (Signed)
Pt also has c/o lower left abd pain for the last 3-4 days. Pain is constant and varies between dull ache and sharp pain. BM's improved the discomfort but it returns shortly after. Pantoprazole has not helped  Pt states she also has a bacterial infection  And has had a headache for the last week.

## 2022-02-25 NOTE — Unmapped (Signed)
Name:  Natalie [name]  DOB: 1970-11-02  Today's Date: 02/24/2022  Age:  52 y.o.    Assessment/Plan:      Natalie [name] was seen today for sinus problem and abdominal pain.    Diagnoses and all orders for this visit:    Perennial allergic rhinitis  -     montelukast (SINGULAIR) 10 mg tablet; Take 1 tablet (10 mg total) by mouth nightly.  -     predniSONE (DELTASONE) 20 MG tablet; Take 1 tablet (20 mg total) by mouth daily.    Intractable migraine without aura and without status migrainosus  -     rizatriptan (MAXALT-MLT) 10 MG disintegrating tablet; Take 1 tablet by mouth at ONSET of migraine or aura. May repeat in 2 HOURS if needed. DO NOT EXCEED 2 doses/day, 2 days/week, 8 doses/month.    LLQ abdominal pain  -     CBC w/ Differential; Future  -     Urinalysis with Microscopy; Future  -     Comprehensive Metabolic Panel; Future    Vaginal dryness    I do not think she has bacterial sinusitis at this time, based on her symptoms.  Advised her to continue using her regimen of allergic rhinitis.  Saline nasal rinsing, and salt and warm water gargling.  I will restart her on montelukast 10 mg daily, and see if it helps.  Will also try oral steroid for the next 3 days, and see if it improves.    Restart rizatriptan for migraine headaches.  Continue prophylactic medicine for migraine.    She is advised to come and get evaluated physically, for her abdominal pain, and vaginal symptoms.    Diagnosis and plan along with any newly prescribed medication(s) were discussed in detail with this patient today. The patient verbalized understanding and agreed with the plan without language barriers or behavioral barriers to understanding unless otherwise noted.    Subjective:      HPI: Natalie [name] is a 52 y.o. female is here for    Chief Complaint   Patient presents with    Sinus Problem    Abdominal Pain     She complains of having nasal congestion, clear nasal drainage, postnasal drip, increased sinus pressure, and cough last 4 to 5 days, and not improving.  Apparently, she has been taking cetirizine twice daily, fluticasone no spray and azelastine no spray.  She feels that this could be bacterial sinusitis, as symptoms not improving.    She also complains of having headache for the last week, located behind the eyes and back of her head.  She has had migraine headaches in the past, and this resembles it.  She also has nausea.  However, she has been out of rizatriptan.  She has been taking gabapentin and nortriptyline for prophylaxis.    She complains of having persistent left-sided lower quadrant pain for the last 3 to 4 days, could be sharp or achy.  She rates the pain as 6-7/10 in intensity.  Apparently, having a bowel movement will help the pain, but it recurs again.  No fever, chills, vomiting, diarrhea, bloody or black stools.  She does have nausea, which is associated with headache.  She has had a normal colonoscopy during January 2023.    She wonders whether she could be having bacterial infection in her vaginal region, as she has been feeling irritated for the last 2 weeks, and feels scratchy.  She reports having slight odor in the vaginal region.  She complains of having vaginal dryness.  She denies any vaginal drainage.       Past Medical/Surgical History:     Past Medical History:   Diagnosis Date    Allergic conjunctivitis 11/23/2018    Anemia 1986    Arthritis     Breast mass     Chondromalacia of left knee     Chondromalacia of right knee     Depressive disorder     Fibrocystic breast     GERD (gastroesophageal reflux disease)     Hematuria     History of kidney stones     History of transfusion     Hyperlipidemia 1999    Hypothyroidism     Lumbar disc disease 1998    She was injured at work at the age of 59 and then had disk surgery 2 years later     Menopause ovarian failure     Menopause, premature     Migraine with aura     Neuromuscular disorder (CMS-HCC) 1999    Obesity (BMI 30-39.9) 11/23/2018    Ovarian cyst Plantar fasciitis     Pseudotumor cerebri     Rash due to allergy 11/23/2018    Sickle cell trait (CMS-HCC)     Urinary tract infection     Vaginitis      Past Surgical History:   Procedure Laterality Date    BACK SURGERY  2001    BREAST BIOPSY Bilateral     When patient was 16 & 17 Both Negative    HYSTERECTOMY  2008    PR REMOVAL OF TONSILS,13+ Y/O Bilateral 10/10/2019    Procedure: TONSILLECTOMY;  Surgeon: Lona Millard, MD;  Location: [facility];  Service: ENT    SALPINGOOPHORECTOMY Bilateral 2007    SPINE SURGERY  2001    TOTAL VAGINAL HYSTERECTOMY  01/04/2006    Uterine Prolapse-Dr. Leeanne Rio OP Note    TUBAL LIGATION  1995       Family History:     Family History   Problem Relation Age of Onset    Heart attack Father     Mental illness Father     Asthma Father     Hypertension Mother     Heart disease Mother     Arthritis Mother     Depression Mother     Drug abuse Mother     Mental illness Mother     Ulcers Mother     COPD Mother     Heart attack Maternal Grandmother     Heart disease Maternal Grandmother     Hypertension Maternal Grandmother     Thyroid disease Maternal Grandmother     Breast cancer Cousin 58    Cancer Sister         lymphoma    COPD Maternal Aunt     Depression Son     Drug abuse Son     Mental illness Son     Stroke Paternal Grandfather     Thyroid disease Other     Alcohol abuse Neg Hx        Social History:     Social History     Socioeconomic History    Marital status: Single     Spouse name: None    Number of children: 2    Years of education: 15    Highest education level: None   Occupational History     Employer: NOT EMPLOYED   Tobacco Use    Smoking status:  Never     Passive exposure: Past    Smokeless tobacco: Never   Vaping Use    Vaping Use: Never used   Substance and Sexual Activity    Alcohol use: Never    Drug use: Never    Sexual activity: Yes     Partners: Male     Birth control/protection: Post-menopausal, Surgical     Comment: steady partner for 4 yrs, as on 04/03/19.   Social History Narrative    Marital Status - Single     Children - Son(s) [2]    Pets - Dog (1) Turtle (1)     Household - Lives with sons and grandson Ephriam Knuckles)     Occupation - Disabled since 2002    Tobacco/Alcohol/Drug Use - Denies      Diet - Regular    Exercise/Sports - Walking     Hobbies - Movies     Education - [employer] (Engineer, agricultural)     Country of Origin - [place]                      Social Determinants of Health     Financial Resource Strain: High Risk (10/20/2021)    Overall Financial Resource Strain (CARDIA)     Difficulty of Paying Living Expenses: Hard   Food Insecurity: Food Insecurity Present (10/20/2021)    Hunger Vital Sign     Worried About Running Out of Food in the Last Year: Sometimes true     Ran Out of Food in the Last Year: Sometimes true   Transportation Needs: Unmet Transportation Needs (10/20/2021)    PRAPARE - Therapist, art (Medical): Yes     Lack of Transportation (Non-Medical): Yes       Allergies:     Buprenorphine hcl, Pregabalin, Adhesive tape-silicones, Doxycycline hyclate (bulk), Keflex [cephalexin], Opioids - morphine analogues, and Oxycodone-acetaminophen    Current Medications:     Current Outpatient Medications   Medication Sig Dispense Refill    albuterol HFA 90 mcg/actuation inhaler Inhale 2 puffs every four (4) hours as needed for wheezing. 25.5 g 0    azelastine (ASTELIN) 137 mcg (0.1 %) nasal spray 2 sprays into each nostril Two (2) times a day. 30 mL 12    azelastine (OPTIVAR) 0.05 % ophthalmic solution Administer 2 drops to both eyes daily as needed. 6 mL 5    baclofen (LIORESAL) 20 MG tablet 10 - 20 mg three times a day as needed for spasms. 90 tablet 2    cetirizine (ZYRTEC) 10 MG tablet       diclofenac sodium (PENNSAID) 20 mg/gram /actuation(2 %) sopm Apply 2 pumps to affected area up to four times a day. 112 g 2    estradioL (ESTRACE) 0.5 MG tablet Take 1 tablet (0.5 mg total) by mouth daily. 30 tablet 1 gabapentin (NEURONTIN) 800 MG tablet Take 1 tablet (800 mg total) by mouth two (2) times a day. 180 tablet 0    hydroCHLOROthiazide (HYDRODIURIL) 12.5 MG tablet TAKE 1 TABLET(12.5 MG) BY MOUTH DAILY AS NEEDED FOR SWELLING 30 tablet 5    nortriptyline (PAMELOR) 25 MG capsule TAKE 1 CAPSULE(25 MG) BY MOUTH EVERY NIGHT 90 capsule 1    olopatadine (PATANOL) 0.1 % ophthalmic solution Administer 1 drop to both eyes Two (2) times a day. 5 mL 1    omalizumab (XOLAIR) 150 mg/mL syringe Inject the contents of 2 syringes (300 mg total) under the skin  every twenty-eight (28) days. 2 mL 11    oxyCODONE-acetaminophen (PERCOCET) 10-325 mg per tablet Take 1 tablet by mouth every six (6) hours as needed for pain. Max 4 tabs/day. Fill on or after: 02/06/22. Brand name only due to allergy. 120 tablet 0    pantoprazole (PROTONIX) 40 MG tablet TAKE 1 TABLET(40 MG) BY MOUTH DAILY 30 tablet 4    potassium chloride 20 MEQ CR tablet Take 1 tablet (20 mEq total) by mouth daily. 90 tablet 2    PROCHAMBER Spcr       rosuvastatin (CRESTOR) 10 MG tablet TAKE 1 TABLET(10 MG) BY MOUTH EVERY NIGHT 90 tablet 0    triamcinolone (KENALOG) 0.1 % cream Apply 1 application topically two (2) times a day as needed. WHEN ALLERGIES FLARE-UP AND CAUSES RASH / ITCHING 80 g 0    XHANCE 93 mcg/actuation AerB 1 spray into each nostril two (2) times a day as needed. 16 mL 11    cetirizine (ZYRTEC) 10 MG chewable tablet Chew 1 tablet (10 mg total) at bedtime as needed for allergies.      EPINEPHrine (EPIPEN) 0.3 mg/0.3 mL injection Inject 0.3 mL (0.3 mg total) into the muscle once for 1 dose. 2 each 0    EPINEPHrine (EPIPEN) 0.3 mg/0.3 mL injection Inject 0.3 mL (0.3 mg total) into the muscle once as needed for anaphylaxis (Difficulty breathing, throat closing, etc) for up to 1 dose. 1 each 12    montelukast (SINGULAIR) 10 mg tablet Take 1 tablet (10 mg total) by mouth nightly. 30 tablet 2    naloxone (NARCAN) 4 mg nasal spray One spray in either nostril once for known/suspected opioid overdose. May repeat every 2-3 minutes in alternating nostril til EMS arrives 1 each 0    [START ON 03/08/2022] oxyCODONE-acetaminophen (PERCOCET) 10-325 mg per tablet Take 1 tablet by mouth every six (6) hours as needed for pain. Max 4 tabs/day. Fill on or after: 03/08/22. Brand name only due to allergy. 120 tablet 0    [START ON 04/07/2022] oxyCODONE-acetaminophen (PERCOCET) 10-325 mg per tablet Take 1 tablet by mouth every six (6) hours as needed for pain. Max 4 tabs/day. Fill on or after: 04/07/22. Brand name only due to allergy. 120 tablet 0    predniSONE (DELTASONE) 20 MG tablet Take 1 tablet (20 mg total) by mouth daily. 4 tablet 0    rizatriptan (MAXALT-MLT) 10 MG disintegrating tablet Take 1 tablet by mouth at ONSET of migraine or aura. May repeat in 2 HOURS if needed. DO NOT EXCEED 2 doses/day, 2 days/week, 8 doses/month. 10 tablet 5     Current Facility-Administered Medications   Medication Dose Route Frequency Provider Last Rate Last Admin    omalizumab Geoffry Paradise) injection 300 mg  300 mg Subcutaneous Q28 Days Volertas, Sofija Dalia, MD   300 mg at 02/09/22 1635       ROS:     Review of Systems    Vital Signs:     Wt Readings from Last 3 Encounters:   01/20/22 74.8 kg (164 lb 12.8 oz)   01/11/22 72.8 kg (160 lb 9.6 oz)   12/24/21 70.3 kg (155 lb)     Temp Readings from Last 3 Encounters:   02/05/22 35.8 ??C (96.5 ??F) (Skin)   01/20/22 36.5 ??C (97.7 ??F) (Temporal)   01/11/22 36.4 ??C (97.5 ??F)     BP Readings from Last 3 Encounters:   02/09/22 127/90   02/05/22 135/88   01/20/22 120/80  Pulse Readings from Last 3 Encounters:   02/09/22 84   02/05/22 84   01/20/22 75     There is no height or weight on file to calculate BMI.    Objective:      Physical Exam  Nursing note reviewed.   Constitutional:       General: She is not in acute distress.     Appearance: She is well-developed.   Pulmonary:      Effort: Pulmonary effort is normal. No respiratory distress.   Neurological:      Mental Status: She is alert and oriented to person, place, and time.   Psychiatric:         Behavior: Behavior normal.         Thought Content: Thought content normal.         Judgment: Judgment normal.           Labs:     No results found for this visit on 02/24/22.    Royal Hawthorn, MD  02/24/2022            The patient reports they are physically located in [place] and is currently: not at home (car, outside). I conducted a audio/video visit. I spent  88m 20s on the video call with the patient. I spent an additional 5 minutes on pre- and post-visit activities on the date of service .

## 2022-03-01 MED ORDER — METRONIDAZOLE 500 MG TABLET
ORAL_TABLET | Freq: Two times a day (BID) | ORAL | 0 refills | 7 days | Status: CP
Start: 2022-03-01 — End: 2022-03-08

## 2022-03-01 NOTE — Unmapped (Signed)
TeleHealth phone Encounter  This medical encounter was conducted virtually using [email or web address]  TeleHealth protocols.      I have identified myself to the patient and conveyed my credentials to Natalie [name]  In case we get disconnected, patient's phone number is There are no phone numbers on file.   Patient has signed informed consent on file in medical record.  Is there someone else in the room? No..        Assessment/Plan:      Natalie [name] was seen today for bv and abdominal pain.    Diagnoses and all orders for this visit:    BV (bacterial vaginosis)  -     metroNIDAZOLE (FLAGYL) 500 MG tablet; Take 1 tablet (500 mg total) by mouth two (2) times a day for 7 days.    Urinary tract infection symptoms  -     POCT urinalysis dipstick; Future    Migraine without aura and without status migrainosus, not intractable  -     Neurology; Future    Will treat for BV  Will place order for POCT UA, once patient drops off urine will send it treatment if needed.    Pt referred to [facility]     No follow-ups on file.     Diagnosis and plan along with any newly prescribed medication(s) were discussed in detail with this patient today. The patient verbalized understanding and agreed with the plan without language barriers or behavioral barriers to understanding unless otherwise noted.    Subjective:   HPI: Natalie [name] is a 52 y.o. female is here for    Chief Complaint   Patient presents with    BV     X 1 week    Abdominal Pain     C/o change in PH balance. Has vaginal mal-odor.  Has cream vaginal discharge.  Has urinary frequency.  Denies to dysuria.  Admits to urgency.     Pt reports, her neurologist moved and needs a referral to a new neurologist.        ROS:     Negative unless noted in HPI    Vital Signs:     Wt Readings from Last 3 Encounters:   01/20/22 74.8 kg (164 lb 12.8 oz)   01/11/22 72.8 kg (160 lb 9.6 oz)   12/24/21 70.3 kg (155 lb)     Temp Readings from Last 3 Encounters:   02/05/22 35.8 ??C (96.5 ??F) (Skin) 01/20/22 36.5 ??C (97.7 ??F) (Temporal)   01/11/22 36.4 ??C (97.5 ??F)     BP Readings from Last 3 Encounters:   02/09/22 127/90   02/05/22 135/88   01/20/22 120/80     Pulse Readings from Last 3 Encounters:   02/09/22 84   02/05/22 84   01/20/22 75     There is no height or weight on file to calculate BMI.    Objective:      As part of this telehealth visit no in-person exam was conducted.  Video interaction permitted the following observations.      The patient reports they are physically located in [place] and is currently: not at home. I conducted a phone visit.  I spent 5:42 minutes on the phone call with the patient on the date of service .             Labs:     No results found for this visit on 03/01/22.

## 2022-03-02 ENCOUNTER — Ambulatory Visit: Admit: 2022-03-02 | Discharge: 2022-03-03 | Payer: MEDICARE

## 2022-03-02 LAB — URINALYSIS WITH MICROSCOPY
BILIRUBIN UA: NEGATIVE
GLUCOSE UA: NEGATIVE
HYPHAL YEAST: NONE SEEN /HPF
KETONES UA: NEGATIVE
LEUKOCYTE ESTERASE UA: NEGATIVE
NITRITE UA: NEGATIVE
PH UA: 6 (ref 5.0–8.0)
PROTEIN UA: NEGATIVE
RBC UA: 22 /HPF — ABNORMAL HIGH (ref 0–3)
SPECIFIC GRAVITY UA: 1.016 (ref 1.005–1.030)
SQUAMOUS EPITHELIAL: 4 /HPF (ref 0–5)
UROBILINOGEN UA: 0.2
WBC CLUMPS: NONE SEEN /HPF
WBC UA: 4 /HPF — ABNORMAL HIGH (ref 0–2)
YEAST: NONE SEEN /HPF

## 2022-03-04 ENCOUNTER — Ambulatory Visit: Admit: 2022-03-04 | Discharge: 2022-03-04 | Payer: MEDICARE

## 2022-03-04 ENCOUNTER — Ambulatory Visit: Admit: 2022-03-04 | Discharge: 2022-03-04 | Payer: MEDICARE | Attending: Sports Medicine | Primary: Sports Medicine

## 2022-03-04 DIAGNOSIS — R399 Unspecified symptoms and signs involving the genitourinary system: Principal | ICD-10-CM

## 2022-03-04 DIAGNOSIS — R3129 Other microscopic hematuria: Principal | ICD-10-CM

## 2022-03-04 DIAGNOSIS — N3001 Acute cystitis with hematuria: Principal | ICD-10-CM

## 2022-03-04 DIAGNOSIS — R1032 Left lower quadrant pain: Principal | ICD-10-CM

## 2022-03-04 MED ORDER — NITROFURANTOIN MONOHYDRATE/MACROCRYSTALS 100 MG CAPSULE
ORAL_CAPSULE | Freq: Two times a day (BID) | ORAL | 0 refills | 5 days | Status: CP
Start: 2022-03-04 — End: 2022-03-09

## 2022-03-04 NOTE — Unmapped (Signed)
Patient called in regards to lab results. Dropped urine off yesterday. Wants to know if she needs a different antibiotic. Please advise

## 2022-03-04 NOTE — Unmapped (Unsigned)
PATIENT: Natalie [name]      AGE: 52 y.o.     DOB: Jun 25, 1970    Medical Record Number:  [id number]            Date of Exam:  03/04/2022      Primary Care Provider:  Royal Hawthorn, MD       Chief Complaint     No chief complaint on file.       History Of Present Illness   Natalie [name] is a  52 y.o.  female who presents today for evaluation of left knee.  She has been complaining of pain in her knee for the past several years.  She has managed this with viscosupplementation as well as corticosteroid injections.  She underwent her last Visco supplementation nearly a year ago.  It was not quite as effective but she did get relief.  Her prior injections lasted about 2 years.  She complains of pain and stiffness in the knee.  The pain is achy and dull but can become sharp at times.  She is felt some discomfort going up and down stairs as well as pivoting.  She has used naproxen as well as Percocet at times to manage the symptoms.    Today's History Update 07/02/2021 - Patient presents for MRI results review of left knee.  She continues to complain of pain medially in the left knee.  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    Today's History Update 10/01/2021 - Patient presents for follow up of bilateral knees.  Her right knee is improved after the viscosupplementation however her left knee continues to be symptomatic.  She has been complaining of ongoing pain over the past 4 months or so in spite of corticosteroid injection as well as viscosupplementation.  She has noticed pain throughout the knee but notices specifically medially when she pivots on the knee and sits crosslegged.  Her prior MRI showed a tear of the medial meniscus as well as degeneration in the medial compartment    Today's History Update 11/19/2021 - Patient presents for follow up of bilateral knees.  Her low knee is the more symptomatic of the 2.  She noticed the onset of her symptoms 2 to 3 weeks ago.  She was pushing her brother in a wheelchair up and inclined.  She feels that this aggravated her knee.  She denies twisting her knee.  She complains of pain medial in location.  She has had some difficulty walking.  She has had a previous MRI demonstrating a meniscus tear as well as some degeneration in the medial compartment.  She has had some benefit with viscosupplementation but symptoms acutely worse at this point.    Today's History Update 11/26/2021 - Patient presents for follow up of right knee.  She underwent a corticosteroid injection for the left knee which was helpful.  The right knee however has continued to be painful and felt would benefit from a corticosteroid injection.    Today's History Update 03/04/2022 - Patient presents for follow up of left knee. ***     Home Medications     Current Outpatient Medications:     albuterol HFA 90 mcg/actuation inhaler, Inhale 2 puffs every four (4) hours as needed for wheezing., Disp: 25.5 g, Rfl: 0    azelastine (ASTELIN) 137 mcg (0.1 %) nasal spray, 2 sprays into each nostril Two (2) times a day., Disp: 30 mL, Rfl: 12    azelastine (OPTIVAR) 0.05 %  ophthalmic solution, Administer 2 drops to both eyes daily as needed., Disp: 6 mL, Rfl: 5    baclofen (LIORESAL) 20 MG tablet, 10 - 20 mg three times a day as needed for spasms., Disp: 90 tablet, Rfl: 2    cetirizine (ZYRTEC) 10 MG chewable tablet, Chew 1 tablet (10 mg total) at bedtime as needed for allergies., Disp: , Rfl:     cetirizine (ZYRTEC) 10 MG tablet, , Disp: , Rfl:     diclofenac sodium (PENNSAID) 20 mg/gram /actuation(2 %) sopm, Apply 2 pumps to affected area up to four times a day., Disp: 112 g, Rfl: 2    EPINEPHrine (EPIPEN) 0.3 mg/0.3 mL injection, Inject 0.3 mL (0.3 mg total) into the muscle once for 1 dose., Disp: 2 each, Rfl: 0    EPINEPHrine (EPIPEN) 0.3 mg/0.3 mL injection, Inject 0.3 mL (0.3 mg total) into the muscle once as needed for anaphylaxis (Difficulty breathing, throat closing, etc) for up to 1 dose., Disp: 1 each, Rfl: 12    estradioL (ESTRACE) 0.5 MG tablet, Take 1 tablet (0.5 mg total) by mouth daily., Disp: 30 tablet, Rfl: 1    gabapentin (NEURONTIN) 800 MG tablet, Take 1 tablet (800 mg total) by mouth two (2) times a day., Disp: 180 tablet, Rfl: 0    hydroCHLOROthiazide (HYDRODIURIL) 12.5 MG tablet, TAKE 1 TABLET(12.5 MG) BY MOUTH DAILY AS NEEDED FOR SWELLING, Disp: 30 tablet, Rfl: 5    metroNIDAZOLE (FLAGYL) 500 MG tablet, Take 1 tablet (500 mg total) by mouth two (2) times a day for 7 days., Disp: 14 tablet, Rfl: 0    montelukast (SINGULAIR) 10 mg tablet, Take 1 tablet (10 mg total) by mouth nightly., Disp: 30 tablet, Rfl: 2    naloxone (NARCAN) 4 mg nasal spray, One spray in either nostril once for known/suspected opioid overdose. May repeat every 2-3 minutes in alternating nostril til EMS arrives, Disp: 1 each, Rfl: 0    nortriptyline (PAMELOR) 25 MG capsule, TAKE 1 CAPSULE(25 MG) BY MOUTH EVERY NIGHT, Disp: 90 capsule, Rfl: 1    olopatadine (PATANOL) 0.1 % ophthalmic solution, Administer 1 drop to both eyes Two (2) times a day., Disp: 5 mL, Rfl: 1    omalizumab (XOLAIR) 150 mg/mL syringe, Inject the contents of 2 syringes (300 mg total) under the skin every twenty-eight (28) days., Disp: 2 mL, Rfl: 11    oxyCODONE-acetaminophen (PERCOCET) 10-325 mg per tablet, Take 1 tablet by mouth every six (6) hours as needed for pain. Max 4 tabs/day. Fill on or after: 02/06/22. Brand name only due to allergy., Disp: 120 tablet, Rfl: 0    [START ON 03/08/2022] oxyCODONE-acetaminophen (PERCOCET) 10-325 mg per tablet, Take 1 tablet by mouth every six (6) hours as needed for pain. Max 4 tabs/day. Fill on or after: 03/08/22. Brand name only due to allergy., Disp: 120 tablet, Rfl: 0    [START ON 04/07/2022] oxyCODONE-acetaminophen (PERCOCET) 10-325 mg per tablet, Take 1 tablet by mouth every six (6) hours as needed for pain. Max 4 tabs/day. Fill on or after: 04/07/22. Brand name only due to allergy., Disp: 120 tablet, Rfl: 0    pantoprazole (PROTONIX) 40 MG tablet, TAKE 1 TABLET(40 MG) BY MOUTH DAILY, Disp: 30 tablet, Rfl: 4    potassium chloride 20 MEQ CR tablet, Take 1 tablet (20 mEq total) by mouth daily., Disp: 90 tablet, Rfl: 2    predniSONE (DELTASONE) 20 MG tablet, Take 1 tablet (20 mg total) by mouth daily., Disp: 4 tablet, Rfl: 0  PROCHAMBER Spcr, , Disp: , Rfl:     rizatriptan (MAXALT-MLT) 10 MG disintegrating tablet, Take 1 tablet by mouth at ONSET of migraine or aura. May repeat in 2 HOURS if needed. DO NOT EXCEED 2 doses/day, 2 days/week, 8 doses/month., Disp: 10 tablet, Rfl: 5    rosuvastatin (CRESTOR) 10 MG tablet, TAKE 1 TABLET(10 MG) BY MOUTH EVERY NIGHT, Disp: 90 tablet, Rfl: 0    triamcinolone (KENALOG) 0.1 % cream, Apply 1 application topically two (2) times a day as needed. WHEN ALLERGIES FLARE-UP AND CAUSES RASH / ITCHING, Disp: 80 g, Rfl: 0    XHANCE 93 mcg/actuation AerB, 1 spray into each nostril two (2) times a day as needed., Disp: 16 mL, Rfl: 11    Current Facility-Administered Medications:     omalizumab Geoffry Paradise) injection 300 mg, 300 mg, Subcutaneous, Q28 Days, Volertas, Sofija Dalia, MD, 300 mg at 02/09/22 1635     Allergies   Buprenorphine hcl, Pregabalin, Adhesive tape-silicones, Doxycycline hyclate (bulk), Keflex [cephalexin], Opioids - morphine analogues, and Oxycodone-acetaminophen    Medical History     Past Medical History:   Diagnosis Date    Allergic conjunctivitis 11/23/2018    Anemia 1986    Arthritis     Breast mass     Chondromalacia of left knee     Chondromalacia of right knee     Depressive disorder     Fibrocystic breast     GERD (gastroesophageal reflux disease)     Hematuria     History of kidney stones     History of transfusion     Hyperlipidemia 1999    Hypothyroidism     Lumbar disc disease 1998    She was injured at work at the age of 20 and then had disk surgery 2 years later     Menopause ovarian failure     Menopause, premature     Migraine with aura     Neuromuscular disorder (CMS-HCC) 1999    Obesity (BMI 30-39.9) 11/23/2018    Ovarian cyst     Plantar fasciitis     Pseudotumor cerebri     Rash due to allergy 11/23/2018    Sickle cell trait (CMS-HCC)     Urinary tract infection     Vaginitis        Surgical History     Past Surgical History:   Procedure Laterality Date    BACK SURGERY  2001    BREAST BIOPSY Bilateral     When patient was 16 & 17 Both Negative    HYSTERECTOMY  2008    PR REMOVAL OF TONSILS,13+ Y/O Bilateral 10/10/2019    Procedure: TONSILLECTOMY;  Surgeon: Lona Millard, MD;  Location: OR [facility];  Service: ENT    SALPINGOOPHORECTOMY Bilateral 2007    SPINE SURGERY  2001    TOTAL VAGINAL HYSTERECTOMY  01/04/2006    Uterine Prolapse-Dr. Leeanne Rio OP Note    TUBAL LIGATION  1995        Social History     Past Medical History:   Diagnosis Date    Allergic conjunctivitis 11/23/2018    Anemia 1986    Arthritis     Breast mass     Chondromalacia of left knee     Chondromalacia of right knee     Depressive disorder     Fibrocystic breast     GERD (gastroesophageal reflux disease)     Hematuria     History of kidney stones  History of transfusion     Hyperlipidemia 1999    Hypothyroidism     Lumbar disc disease 1998    She was injured at work at the age of 42 and then had disk surgery 2 years later     Menopause ovarian failure     Menopause, premature     Migraine with aura     Neuromuscular disorder (CMS-HCC) 1999    Obesity (BMI 30-39.9) 11/23/2018    Ovarian cyst     Plantar fasciitis     Pseudotumor cerebri     Rash due to allergy 11/23/2018    Sickle cell trait (CMS-HCC)     Urinary tract infection     Vaginitis         Family History     Family History   Problem Relation Age of Onset    Heart attack Father     Mental illness Father     Asthma Father     Hypertension Mother     Heart disease Mother     Arthritis Mother     Depression Mother     Drug abuse Mother     Mental illness Mother     Ulcers Mother     COPD Mother Heart attack Maternal Grandmother     Heart disease Maternal Grandmother     Hypertension Maternal Grandmother     Thyroid disease Maternal Grandmother     Breast cancer Cousin 41    Cancer Sister         lymphoma    COPD Maternal Aunt     Depression Son     Drug abuse Son     Mental illness Son     Stroke Paternal Grandfather     Thyroid disease Other     Alcohol abuse Neg Hx      Family Status   Relation Name Status    Father DJ Deceased    Mother Particia Nearing    [employer]/textile technologist (Not Specified)    Cousin  Deceased    Sister Marvis Repress (Not Specified)    Mat Aunt Amada Jupiter Deceased    Son Cloretta Ned (Not Specified)    PGF HV (Not Specified)    Other  (Not Specified)    Neg Hx  (Not Specified)       Physical Exam     There were no vitals filed for this visit.         Appearance: Well nourished/well developed.  Psychiatric: Mood and affect appropriate.    Head:  Normocephalic, atraumatic.   Skin:  Clean, dry, no lesions, no rashes.  Neuro:  Alert and oriented.  Respiratory: Unlabored.       FOCUSED EXAM:  Knee Exam:  Evaluation the right knee revealed no effusion.  There was some tenderness medial joint line and pain with patellar grind.  Ligamentously stable.  No pain with McMurray's test today.                              Imaging       MRI of the left knee was independently reviewed today.  The MRI showed significant area of grade 4 chondral loss involving the posterior aspect of the medial femoral condyle.  There was significant subchondral cystic changes and bone marrow edema.  There was an increase signal within the undersurface of the medial meniscus with subtle undersurface tear.  Articular cartilage on the tibial plateau medially was  intact.  Patellofemoral articular cartilage was preserved as was the lateral articular compartment cartilage.      Assessment & Plan     Diagnosis:   No diagnosis found.          Prescription:      Your Medication List            Accurate as of March 04, 2022  7:50 AM. If you have any questions, ask your nurse or doctor.                CONTINUE taking these medications      albuterol 90 mcg/actuation inhaler  Commonly known as: PROVENTIL HFA;VENTOLIN HFA  Inhale 2 puffs every four (4) hours as needed for wheezing.     azelastine 137 mcg (0.1 %) nasal spray  Commonly known as: ASTELIN  2 sprays into each nostril Two (2) times a day.     azelastine 0.05 % ophthalmic solution  Commonly known as: OPTIVAR  Administer 2 drops to both eyes daily as needed.     baclofen 20 MG tablet  Commonly known as: LIORESAL  10 - 20 mg three times a day as needed for spasms.     cetirizine 10 MG chewable tablet  Commonly known as: ZYRTEC  Chew 1 tablet (10 mg total) at bedtime as needed for allergies.     cetirizine 10 MG tablet  Commonly known as: ZYRTEC     diclofenac sodium 20 mg/gram /actuation(2 %) Sopm  Commonly known as: PENNSAID  Apply 2 pumps to affected area up to four times a day.     EPINEPHrine 0.3 mg/0.3 mL injection  Commonly known as: EPIPEN  Inject 0.3 mL (0.3 mg total) into the muscle once for 1 dose.     EPINEPHrine 0.3 mg/0.3 mL injection  Commonly known as: EPIPEN  Inject 0.3 mL (0.3 mg total) into the muscle once as needed for anaphylaxis (Difficulty breathing, throat closing, etc) for up to 1 dose.     estradiol 0.5 MG tablet  Commonly known as: ESTRACE  Take 1 tablet (0.5 mg total) by mouth daily.     gabapentin 800 MG tablet  Commonly known as: NEURONTIN  Take 1 tablet (800 mg total) by mouth two (2) times a day.     hydroCHLOROthiazide 12.5 MG tablet  TAKE 1 TABLET(12.5 MG) BY MOUTH DAILY AS NEEDED FOR SWELLING     metroNIDAZOLE 500 MG tablet  Commonly known as: FLAGYL  Take 1 tablet (500 mg total) by mouth two (2) times a day for 7 days.     montelukast 10 mg tablet  Commonly known as: SINGULAIR  Take 1 tablet (10 mg total) by mouth nightly.     naloxone 4 mg/actuation nasal spray  Commonly known as: NARCAN  One spray in either nostril once for known/suspected opioid overdose. May repeat every 2-3 minutes in alternating nostril til EMS arrives     nortriptyline 25 MG capsule  Commonly known as: PAMELOR  TAKE 1 CAPSULE(25 MG) BY MOUTH EVERY NIGHT     olopatadine 0.1 % ophthalmic solution  Commonly known as: PatanoL  Administer 1 drop to both eyes Two (2) times a day.     oxyCODONE-acetaminophen 10-325 mg per tablet  Commonly known as: PERCOCET  Take 1 tablet by mouth every six (6) hours as needed for pain. Max 4 tabs/day. Fill on or after: 02/06/22. Brand name only due to allergy.     oxyCODONE-acetaminophen 10-325 mg per tablet  Commonly  known as: PERCOCET  Take 1 tablet by mouth every six (6) hours as needed for pain. Max 4 tabs/day. Fill on or after: 03/08/22. Brand name only due to allergy.  Start taking on: March 08, 2022     oxyCODONE-acetaminophen 10-325 mg per tablet  Commonly known as: PERCOCET  Take 1 tablet by mouth every six (6) hours as needed for pain. Max 4 tabs/day. Fill on or after: 04/07/22. Brand name only due to allergy.  Start taking on: April 07, 2022     pantoprazole 40 MG tablet  Commonly known as: Protonix  TAKE 1 TABLET(40 MG) BY MOUTH DAILY     potassium chloride 20 MEQ ER tablet  Take 1 tablet (20 mEq total) by mouth daily.     predniSONE 20 MG tablet  Commonly known as: DELTASONE  Take 1 tablet (20 mg total) by mouth daily.     PROCHAMBER Spcr  Generic drug: inhalational spacing device     rizatriptan 10 MG disintegrating tablet  Commonly known as: MAXALT-MLT  Take 1 tablet by mouth at ONSET of migraine or aura. May repeat in 2 HOURS if needed. DO NOT EXCEED 2 doses/day, 2 days/week, 8 doses/month.     rosuvastatin 10 MG tablet  Commonly known as: CRESTOR  TAKE 1 TABLET(10 MG) BY MOUTH EVERY NIGHT     triamcinolone 0.1 % cream  Commonly known as: KENALOG  Apply 1 application topically two (2) times a day as needed. WHEN ALLERGIES FLARE-UP AND CAUSES RASH / ITCHING     XHANCE 93 mcg/actuation Aerb  Generic drug: fluticasone propionate  1 spray into each nostril two (2) times a day as needed.     XOLAIR 150 mg/mL syringe  Generic drug: omalizumab  Inject the contents of 2 syringes (300 mg total) under the skin every twenty-eight (28) days.              Plan: We discussed the findings today.  Her right knee has been symptomatic over the past couple of months as well.  We did not do specific x-rays of the knee today and I suspect that she does have some arthritic changes in this knee as well although not severe.  We elected to proceed with a corticosteroid injection which did help her on the left side.  She will continue doing her home exercise program.    Procedures  No follow-ups on file.    Reed Pandy Keller Bounds, CMA  Date: 03/04/2022  Time: 7:50 AM

## 2022-03-04 NOTE — Unmapped (Signed)
Patient came and wasn't able to give enough urine, She will come back to give more urine later today. I did the POCT UA (Will put results in). Patient says she has been experiencing frequent urination; slight light discharge, and moderate itching

## 2022-03-04 NOTE — Unmapped (Signed)
Patient coming today to leave another urine sample .

## 2022-03-05 ENCOUNTER — Ambulatory Visit: Admit: 2022-03-05 | Discharge: 2022-03-06 | Payer: MEDICARE

## 2022-03-05 DIAGNOSIS — R3129 Other microscopic hematuria: Principal | ICD-10-CM

## 2022-03-05 LAB — URINALYSIS WITH MICROSCOPY WITH CULTURE REFLEX
BILIRUBIN UA: NEGATIVE
GLUCOSE UA: NEGATIVE
HYPHAL YEAST: NONE SEEN /HPF
KETONES UA: NEGATIVE
LEUKOCYTE ESTERASE UA: NEGATIVE
NITRITE UA: NEGATIVE
PH UA: 6 (ref 5.0–8.0)
PROTEIN UA: NEGATIVE
RBC UA: 4 /HPF — ABNORMAL HIGH (ref 0–3)
SPECIFIC GRAVITY UA: 1.006 (ref 1.005–1.030)
SQUAMOUS EPITHELIAL: 8 /HPF — ABNORMAL HIGH (ref 0–5)
UROBILINOGEN UA: 0.2
WBC CLUMPS: NONE SEEN /HPF
WBC UA: <1 /HPF (ref 0–2)
YEAST: NONE SEEN /HPF

## 2022-03-05 NOTE — Unmapped (Signed)
Patient brung in sample today. Currently in process

## 2022-03-08 ENCOUNTER — Ambulatory Visit: Admit: 2022-03-08 | Discharge: 2022-03-09 | Payer: MEDICARE | Attending: Sports Medicine | Primary: Sports Medicine

## 2022-03-08 DIAGNOSIS — M170 Bilateral primary osteoarthritis of knee: Principal | ICD-10-CM

## 2022-03-08 DIAGNOSIS — M1712 Unilateral primary osteoarthritis, left knee: Principal | ICD-10-CM

## 2022-03-08 DIAGNOSIS — S83232A Complex tear of medial meniscus, current injury, left knee, initial encounter: Principal | ICD-10-CM

## 2022-03-08 MED ORDER — OXYCODONE-ACETAMINOPHEN 10 MG-325 MG TABLET
ORAL_TABLET | Freq: Four times a day (QID) | ORAL | 0 refills | 30 days | Status: CP | PRN
Start: 2022-03-08 — End: ?

## 2022-03-08 NOTE — Unmapped (Signed)
Please schedule for viscosupplementation both knees with Lanora Manis

## 2022-03-08 NOTE — Unmapped (Signed)
PATIENT: Natalie [name]      AGE: 52 y.o.     DOB: 08/31/1970    Medical Record Number:  [id number]            Date of Exam:  03/08/2022      Primary Care Provider:  Royal Hawthorn, MD       Chief Complaint     Chief Complaint   Patient presents with    Left Knee - Follow-up        History Of Present Illness   Natalie [name] is a  52 y.o.  female who presents today for evaluation of left knee.  She has been complaining of pain in her knee for the past several years.  She has managed this with viscosupplementation as well as corticosteroid injections.  She underwent her last Visco supplementation nearly a year ago.  It was not quite as effective but she did get relief.  Her prior injections lasted about 2 years.  She complains of pain and stiffness in the knee.  The pain is achy and dull but can become sharp at times.  She is felt some discomfort going up and down stairs as well as pivoting.  She has used naproxen as well as Percocet at times to manage the symptoms.    Today's History Update 07/02/2021 - Patient presents for MRI results review of left knee.  She continues to complain of pain medially in the left knee.  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    Today's History Update 10/01/2021 - Patient presents for follow up of bilateral knees.  Her right knee is improved after the viscosupplementation however her left knee continues to be symptomatic.  She has been complaining of ongoing pain over the past 4 months or so in spite of corticosteroid injection as well as viscosupplementation.  She has noticed pain throughout the knee but notices specifically medially when she pivots on the knee and sits crosslegged.  Her prior MRI showed a tear of the medial meniscus as well as degeneration in the medial compartment    Today's History Update 11/19/2021 - Patient presents for follow up of bilateral knees.  Her low knee is the more symptomatic of the 2. She noticed the onset of her symptoms 2 to 3 weeks ago.  She was pushing her brother in a wheelchair up and inclined.  She feels that this aggravated her knee.  She denies twisting her knee.  She complains of pain medial in location.  She has had some difficulty walking.  She has had a previous MRI demonstrating a meniscus tear as well as some degeneration in the medial compartment.  She has had some benefit with viscosupplementation but symptoms acutely worse at this point.    Today's History Update 11/26/2021 - Patient presents for follow up of right knee.  She underwent a corticosteroid injection for the left knee which was helpful.  The right knee however has continued to be painful and felt would benefit from a corticosteroid injection.    Today's History Update 03/08/2022 - Patient presents for follow up of left knee.  She has been can planing of increasing knee pain over the past number of weeks.  She was last seen for this knee about 4 months ago.  She has been treated in the past with viscosupplementation with good success.  The right knee has also been symptomatic but not as pronounced as the left.      Home Medications  Current Outpatient Medications:     azelastine (ASTELIN) 137 mcg (0.1 %) nasal spray, 2 sprays into each nostril Two (2) times a day., Disp: 30 mL, Rfl: 12    azelastine (OPTIVAR) 0.05 % ophthalmic solution, Administer 2 drops to both eyes daily as needed., Disp: 6 mL, Rfl: 5    baclofen (LIORESAL) 20 MG tablet, 10 - 20 mg three times a day as needed for spasms., Disp: 90 tablet, Rfl: 2    cetirizine (ZYRTEC) 10 MG chewable tablet, Chew 1 tablet (10 mg total) at bedtime as needed for allergies., Disp: , Rfl:     cetirizine (ZYRTEC) 10 MG tablet, , Disp: , Rfl:     diclofenac sodium (PENNSAID) 20 mg/gram /actuation(2 %) sopm, Apply 2 pumps to affected area up to four times a day., Disp: 112 g, Rfl: 2    EPINEPHrine (EPIPEN) 0.3 mg/0.3 mL injection, Inject 0.3 mL (0.3 mg total) into the muscle once as needed for anaphylaxis (Difficulty breathing, throat closing, etc) for up to 1 dose., Disp: 1 each, Rfl: 12    estradioL (ESTRACE) 0.5 MG tablet, Take 1 tablet (0.5 mg total) by mouth daily., Disp: 30 tablet, Rfl: 1    gabapentin (NEURONTIN) 800 MG tablet, Take 1 tablet (800 mg total) by mouth two (2) times a day., Disp: 180 tablet, Rfl: 0    hydroCHLOROthiazide (HYDRODIURIL) 12.5 MG tablet, TAKE 1 TABLET(12.5 MG) BY MOUTH DAILY AS NEEDED FOR SWELLING, Disp: 30 tablet, Rfl: 5    metroNIDAZOLE (FLAGYL) 500 MG tablet, Take 1 tablet (500 mg total) by mouth two (2) times a day for 7 days., Disp: 14 tablet, Rfl: 0    montelukast (SINGULAIR) 10 mg tablet, Take 1 tablet (10 mg total) by mouth nightly., Disp: 30 tablet, Rfl: 2    naloxone (NARCAN) 4 mg nasal spray, One spray in either nostril once for known/suspected opioid overdose. May repeat every 2-3 minutes in alternating nostril til EMS arrives, Disp: 1 each, Rfl: 0    nitrofurantoin, macrocrystal-monohydrate, (MACROBID) 100 MG capsule, Take 1 capsule (100 mg total) by mouth two (2) times a day for 5 days., Disp: 10 capsule, Rfl: 0    nortriptyline (PAMELOR) 25 MG capsule, TAKE 1 CAPSULE(25 MG) BY MOUTH EVERY NIGHT, Disp: 90 capsule, Rfl: 1    olopatadine (PATANOL) 0.1 % ophthalmic solution, Administer 1 drop to both eyes Two (2) times a day., Disp: 5 mL, Rfl: 1    omalizumab (XOLAIR) 150 mg/mL syringe, Inject the contents of 2 syringes (300 mg total) under the skin every twenty-eight (28) days., Disp: 2 mL, Rfl: 11    oxyCODONE-acetaminophen (PERCOCET) 10-325 mg per tablet, Take 1 tablet by mouth every six (6) hours as needed for pain. Max 4 tabs/day. Fill on or after: 02/06/22. Brand name only due to allergy., Disp: 120 tablet, Rfl: 0    oxyCODONE-acetaminophen (PERCOCET) 10-325 mg per tablet, Take 1 tablet by mouth every six (6) hours as needed for pain. Max 4 tabs/day. Fill on or after: 03/08/22. Brand name only due to allergy., Disp: 120 tablet, Rfl: 0    [START ON 04/07/2022] oxyCODONE-acetaminophen (PERCOCET) 10-325 mg per tablet, Take 1 tablet by mouth every six (6) hours as needed for pain. Max 4 tabs/day. Fill on or after: 04/07/22. Brand name only due to allergy., Disp: 120 tablet, Rfl: 0    pantoprazole (PROTONIX) 40 MG tablet, TAKE 1 TABLET(40 MG) BY MOUTH DAILY, Disp: 30 tablet, Rfl: 4    potassium chloride 20 MEQ CR tablet,  Take 1 tablet (20 mEq total) by mouth daily., Disp: 90 tablet, Rfl: 2    predniSONE (DELTASONE) 20 MG tablet, Take 1 tablet (20 mg total) by mouth daily., Disp: 4 tablet, Rfl: 0    PROCHAMBER Spcr, , Disp: , Rfl:     rizatriptan (MAXALT-MLT) 10 MG disintegrating tablet, Take 1 tablet by mouth at ONSET of migraine or aura. May repeat in 2 HOURS if needed. DO NOT EXCEED 2 doses/day, 2 days/week, 8 doses/month., Disp: 10 tablet, Rfl: 5    rosuvastatin (CRESTOR) 10 MG tablet, TAKE 1 TABLET(10 MG) BY MOUTH EVERY NIGHT, Disp: 90 tablet, Rfl: 0    triamcinolone (KENALOG) 0.1 % cream, Apply 1 application topically two (2) times a day as needed. WHEN ALLERGIES FLARE-UP AND CAUSES RASH / ITCHING, Disp: 80 g, Rfl: 0    XHANCE 93 mcg/actuation AerB, 1 spray into each nostril two (2) times a day as needed., Disp: 16 mL, Rfl: 11    albuterol HFA 90 mcg/actuation inhaler, Inhale 2 puffs every four (4) hours as needed for wheezing., Disp: 25.5 g, Rfl: 0    EPINEPHrine (EPIPEN) 0.3 mg/0.3 mL injection, Inject 0.3 mL (0.3 mg total) into the muscle once for 1 dose., Disp: 2 each, Rfl: 0    Current Facility-Administered Medications:     omalizumab Geoffry Paradise) injection 300 mg, 300 mg, Subcutaneous, Q28 Days, Volertas, Sofija Dalia, MD, 300 mg at 02/09/22 1635     Allergies   Buprenorphine hcl, Pregabalin, Adhesive tape-silicones, Doxycycline hyclate (bulk), Keflex [cephalexin], Opioids - morphine analogues, and Oxycodone-acetaminophen    Medical History     Past Medical History:   Diagnosis Date    Allergic conjunctivitis 11/23/2018    Anemia 1986 Arthritis     Breast mass     Chondromalacia of left knee     Chondromalacia of right knee     Depressive disorder     Fibrocystic breast     GERD (gastroesophageal reflux disease)     Hematuria     History of kidney stones     History of transfusion     Hyperlipidemia 1999    Hypothyroidism     Lumbar disc disease 1998    She was injured at work at the age of 56 and then had disk surgery 2 years later     Menopause ovarian failure     Menopause, premature     Migraine with aura     Neuromuscular disorder (CMS-HCC) 1999    Obesity (BMI 30-39.9) 11/23/2018    Ovarian cyst     Plantar fasciitis     Pseudotumor cerebri     Rash due to allergy 11/23/2018    Sickle cell trait (CMS-HCC)     Urinary tract infection     Vaginitis        Surgical History     Past Surgical History:   Procedure Laterality Date    BACK SURGERY  2001    BREAST BIOPSY Bilateral     When patient was 16 & 17 Both Negative    HYSTERECTOMY  2008    PR REMOVAL OF TONSILS,13+ Y/O Bilateral 10/10/2019    Procedure: TONSILLECTOMY;  Surgeon: Lona Millard, MD;  Location: OR [facility];  Service: ENT    SALPINGOOPHORECTOMY Bilateral 2007    SPINE SURGERY  2001    TOTAL VAGINAL HYSTERECTOMY  01/04/2006    Uterine Prolapse-Dr. Leeanne Rio OP Note    TUBAL LIGATION  1995  Social History     Past Medical History:   Diagnosis Date    Allergic conjunctivitis 11/23/2018    Anemia 1986    Arthritis     Breast mass     Chondromalacia of left knee     Chondromalacia of right knee     Depressive disorder     Fibrocystic breast     GERD (gastroesophageal reflux disease)     Hematuria     History of kidney stones     History of transfusion     Hyperlipidemia 1999    Hypothyroidism     Lumbar disc disease 1998    She was injured at work at the age of 7 and then had disk surgery 2 years later     Menopause ovarian failure     Menopause, premature     Migraine with aura     Neuromuscular disorder (CMS-HCC) 1999    Obesity (BMI 30-39.9) 11/23/2018    Ovarian cyst     Plantar fasciitis     Pseudotumor cerebri     Rash due to allergy 11/23/2018    Sickle cell trait (CMS-HCC)     Urinary tract infection     Vaginitis         Family History     Family History   Problem Relation Age of Onset    Heart attack Father     Mental illness Father     Asthma Father     Hypertension Mother     Heart disease Mother     Arthritis Mother     Depression Mother     Drug abuse Mother     Mental illness Mother     Ulcers Mother     COPD Mother     Heart attack Maternal Grandmother     Heart disease Maternal Grandmother     Hypertension Maternal Grandmother     Thyroid disease Maternal Grandmother     Breast cancer Cousin 31    Cancer Sister         lymphoma    COPD Maternal Aunt     Depression Son     Drug abuse Son     Mental illness Son     Stroke Paternal Grandfather     Thyroid disease Other     Alcohol abuse Neg Hx      Family Status   Relation Name Status    Father DJ Deceased    Mother Annice Pih Alive    [employer]/textile technologist (Not Specified)    Cousin  Deceased    Sister Marvis Repress (Not Specified)    Mat Aunt Amada Jupiter Deceased    Son Cloretta Ned (Not Specified)    PGF HV (Not Specified)    Other  (Not Specified)    Neg Hx  (Not Specified)       Physical Exam          03/08/22 1459   BP: 134/78   Pulse: 68   Weight: 72.6 kg (160 lb)   Height: 154.9 cm (5' 1)          Appearance: Well nourished/well developed.  Psychiatric: Mood and affect appropriate.    Head:  Normocephalic, atraumatic.   Skin:  Clean, dry, no lesions, no rashes.  Neuro:  Alert and oriented.  Respiratory: Unlabored.       FOCUSED EXAM:  Knee Exam: Left knee with no effusion today.  Tenderness medial joint line.  Mild pain with patellar grind.  She was  able to walk without a significant limp.  Ligamentously stable.  0 to 120 degrees flexion.                          Imaging       MRI of the left knee was independently reviewed today.  The MRI showed significant area of grade 4 chondral loss involving the posterior aspect of the medial femoral condyle.  There was significant subchondral cystic changes and bone marrow edema.  There was an increase signal within the undersurface of the medial meniscus with subtle undersurface tear.  Articular cartilage on the tibial plateau medially was intact.  Patellofemoral articular cartilage was preserved as was the lateral articular compartment cartilage.      Assessment & Plan     Diagnosis:   1. Primary osteoarthritis of both knees    2. Primary osteoarthritis of left knee    3. Complex tear of medial meniscus of left knee as current injury, initial encounter              Prescription:      Your Medication List            Accurate as of March 08, 2022  3:44 PM. If you have any questions, ask your nurse or doctor.                CONTINUE taking these medications      albuterol 90 mcg/actuation inhaler  Commonly known as: PROVENTIL HFA;VENTOLIN HFA  Inhale 2 puffs every four (4) hours as needed for wheezing.     azelastine 137 mcg (0.1 %) nasal spray  Commonly known as: ASTELIN  2 sprays into each nostril Two (2) times a day.     azelastine 0.05 % ophthalmic solution  Commonly known as: OPTIVAR  Administer 2 drops to both eyes daily as needed.     baclofen 20 MG tablet  Commonly known as: LIORESAL  10 - 20 mg three times a day as needed for spasms.     cetirizine 10 MG chewable tablet  Commonly known as: ZYRTEC  Chew 1 tablet (10 mg total) at bedtime as needed for allergies.     cetirizine 10 MG tablet  Commonly known as: ZYRTEC     diclofenac sodium 20 mg/gram /actuation(2 %) Sopm  Commonly known as: PENNSAID  Apply 2 pumps to affected area up to four times a day.     EPINEPHrine 0.3 mg/0.3 mL injection  Commonly known as: EPIPEN  Inject 0.3 mL (0.3 mg total) into the muscle once for 1 dose.     EPINEPHrine 0.3 mg/0.3 mL injection  Commonly known as: EPIPEN  Inject 0.3 mL (0.3 mg total) into the muscle once as needed for anaphylaxis (Difficulty breathing, throat closing, etc) for up to 1 dose.     estradiol 0.5 MG tablet  Commonly known as: ESTRACE  Take 1 tablet (0.5 mg total) by mouth daily.     gabapentin 800 MG tablet  Commonly known as: NEURONTIN  Take 1 tablet (800 mg total) by mouth two (2) times a day.     hydroCHLOROthiazide 12.5 MG tablet  TAKE 1 TABLET(12.5 MG) BY MOUTH DAILY AS NEEDED FOR SWELLING     metroNIDAZOLE 500 MG tablet  Commonly known as: FLAGYL  Take 1 tablet (500 mg total) by mouth two (2) times a day for 7 days.     montelukast 10 mg tablet  Commonly known as: SINGULAIR  Take 1 tablet (10 mg total) by mouth nightly.     naloxone 4 mg/actuation nasal spray  Commonly known as: NARCAN  One spray in either nostril once for known/suspected opioid overdose. May repeat every 2-3 minutes in alternating nostril til EMS arrives     nitrofurantoin (macrocrystal-monohydrate) 100 MG capsule  Commonly known as: MACROBID  Take 1 capsule (100 mg total) by mouth two (2) times a day for 5 days.     nortriptyline 25 MG capsule  Commonly known as: PAMELOR  TAKE 1 CAPSULE(25 MG) BY MOUTH EVERY NIGHT     olopatadine 0.1 % ophthalmic solution  Commonly known as: PatanoL  Administer 1 drop to both eyes Two (2) times a day.     oxyCODONE-acetaminophen 10-325 mg per tablet  Commonly known as: PERCOCET  Take 1 tablet by mouth every six (6) hours as needed for pain. Max 4 tabs/day. Fill on or after: 02/06/22. Brand name only due to allergy.     oxyCODONE-acetaminophen 10-325 mg per tablet  Commonly known as: PERCOCET  Take 1 tablet by mouth every six (6) hours as needed for pain. Max 4 tabs/day. Fill on or after: 03/08/22. Brand name only due to allergy.     oxyCODONE-acetaminophen 10-325 mg per tablet  Commonly known as: PERCOCET  Take 1 tablet by mouth every six (6) hours as needed for pain. Max 4 tabs/day. Fill on or after: 04/07/22. Brand name only due to allergy.  Start taking on: April 07, 2022     pantoprazole 40 MG tablet  Commonly known as: Protonix  TAKE 1 TABLET(40 MG) BY MOUTH DAILY     potassium chloride 20 MEQ ER tablet  Take 1 tablet (20 mEq total) by mouth daily.     predniSONE 20 MG tablet  Commonly known as: DELTASONE  Take 1 tablet (20 mg total) by mouth daily.     PROCHAMBER Spcr  Generic drug: inhalational spacing device     rizatriptan 10 MG disintegrating tablet  Commonly known as: MAXALT-MLT  Take 1 tablet by mouth at ONSET of migraine or aura. May repeat in 2 HOURS if needed. DO NOT EXCEED 2 doses/day, 2 days/week, 8 doses/month.     rosuvastatin 10 MG tablet  Commonly known as: CRESTOR  TAKE 1 TABLET(10 MG) BY MOUTH EVERY NIGHT     triamcinolone 0.1 % cream  Commonly known as: KENALOG  Apply 1 application topically two (2) times a day as needed. WHEN ALLERGIES FLARE-UP AND CAUSES RASH / ITCHING     XHANCE 93 mcg/actuation Aerb  Generic drug: fluticasone propionate  1 spray into each nostril two (2) times a day as needed.     XOLAIR 150 mg/mL syringe  Generic drug: omalizumab  Inject the contents of 2 syringes (300 mg total) under the skin every twenty-eight (28) days.              Plan: I reviewed with her the findings.  Unfortunately the arthritis has progressed to a modest degree in this left knee and this continues to be symptomatic.  We discussed that it does not show advancement to the point that she would need a knee replacement and she has had good success with viscosupplementation.  The viscosupplementation has allowed her to increase her activity level and avoid medication use to manage the pain.  We are therefore going to repeat a viscosupplementation series and will continue to repeat this as needed as long as beneficial.    The patient reports increased  function, decreased pain and decreased need for anti-inflammatory and pain medications following the last viscosupplementation injection series.    Lorraine Lax, MD  Date: 03/08/2022  Time: 3:44 PM

## 2022-03-08 NOTE — Unmapped (Signed)
Pre-procedure call made and RN spoke with patient.    Denies recent infection/antibiotics.   Denies being diabetic.  Denies taking any recent blood thinners/NSAIDs.   Denies any electronic implants  Denies pregnancy    Driver necessary - no    Patient informed to arrive 30 minutes before procedure appointment time.  Patient verbalized understanding to all.

## 2022-03-08 NOTE — Unmapped (Signed)
Called  and left voicemail: Unable to leave a voicemail  Appt date: 03/08/22  Appt time: 07:30 AM advised to arrive 30 mins prior to appt.  Physician: Dr. Oda Kilts  Location: [phone]   Clinic phone number: [phone]  Driver? No  Advised to call clinic if experiencing COVID symptoms, signs of infection or recent use of antibiotics or steroids.

## 2022-03-09 ENCOUNTER — Ambulatory Visit: Admit: 2022-03-09 | Discharge: 2022-03-10 | Payer: MEDICARE

## 2022-03-09 DIAGNOSIS — L508 Other urticaria: Principal | ICD-10-CM

## 2022-03-09 MED ADMIN — omalizumab (XOLAIR) injection 300 mg: 300 mg | SUBCUTANEOUS | @ 20:00:00

## 2022-03-09 NOTE — Unmapped (Signed)
Patient in for Xolair injections today. Received Xolair 300 mg subcutaneously, tolerated injections well. No other complaints at this time.      Pt self injected today and would like to continue shots at home.     Specialty

## 2022-03-09 NOTE — Unmapped (Signed)
03/09/22-per [name] in clinic pt now admin @ home requested will re-enroll pt in Allergy and Asthma [facility]

## 2022-03-10 NOTE — Unmapped (Signed)
03/10/22 Submitted for insurance benefits verification, will contact patient once insurance benefits/auth obtained. KA

## 2022-03-11 ENCOUNTER — Ambulatory Visit: Admit: 2022-03-11 | Discharge: 2022-03-12 | Payer: MEDICARE | Attending: Anesthesiology | Primary: Anesthesiology

## 2022-03-11 DIAGNOSIS — M7918 Myalgia, other site: Principal | ICD-10-CM

## 2022-03-11 MED ADMIN — bupivacaine HCl (MARCAINE) 0.25 % (2.5 mg/mL) injection 75 mg: 30 mL | @ 13:00:00 | Stop: 2022-03-11

## 2022-03-11 NOTE — Unmapped (Signed)
AVS reviewed with patient and she verbalized understanding to all.

## 2022-03-11 NOTE — Unmapped (Signed)
POST PROCEDURE INSTRUCTIONS   You may apply an ice pack 20-30 minutes at a time to the injection site if you experience soreness.     Keep the injection site clean and dry. You make remove the band-aid one day following the procedure.     You may take a shower but AVOID getting in to baths, pools or whirlpools for 48 HOURS AFTER THE PROCEDURE.       ACTIVITY   Refrain from heavy activity for the next 24 to 48 hours. General walking is okay. You may resume your normal activities the day following the procedure.   You may start or resume your individualized exercise program or physical therapy 48 hours after the procedure.  IF YOU'VE HAD TRIGGER POINT INJECTIONS, YOU MAY CONTINUE EXERCISE OR PHYSICAL THERAPY WITHOUT DELAY.  MEDICATIONS   Please note that it is okay to continue other prescribed medications (blood pressure, insulin, water pill, depression/anxiety pill, etc.) as well as other prescribed pain medications such as Neurontin, Lyrical, Celebrex, Ultram, Vicodin, Norco and acetaminophen (Tylenol).   SIDE EFFECTS   Increase in pain during the first 24 to 48 hours.     You might experience:   1. Mild to moderate swelling at the joint.   2. Possible bruising at the injection site.     WHEN TO CALL THE DOCTOR/NURSE   Severe pain, worse or different that the pain you had before the procedure.     Fever or chills.     Redness, or swelling around the injection site.     Call the Pain Management  Procedural nurses [phone],  during normal business hours (7 am-3 pm). If it is AFTER HOURS or during a weekend or holiday, call the hospital operator and ask for the Anesthesia Pain physician on call at [phone].   FOR EMERGENCIES, CALL 911 OR GO TO THE NEAREST HOSPITAL EMERGENCY DEPARTMENT.   ?   TO SCHEDULE APPOINTMENTS OR FOR QUESTIONS RELATED TO MEDICATIONS   Call the Pain Management Clinic at [phone]

## 2022-03-11 NOTE — Unmapped (Addendum)
Trigger Point Injection of 3+ muscle groups: Bilateral cervical, thoracic and lumbar paraspinals, bilateral trapezius, rhomboids, and levator scapulae, bilateral quadratus and gluteus medius  Pre-operative diagnosis: Myofascial pain  Post-operative diagnosis: Same  Attending Physician: Dr. Criss Rosales  Assistant: Marcelle Smiling, MD     Brief HPI:  The patient is a 52 y.o. female with past medical history of pseudotumor cerebri, bilateral knee osteoarthritis, myofascial pain syndrome, L5-S1 spinal fusion in 2001, coccydynia. She presents today for treatment of her myofascial paracervical, shoulders, upper mid and lower back areas with repeat trigger point injections    2/15-last TPI 12/15, overall doing OK. She underwent cervical MBBs with great results.     Reports significant benefit from continuing on a regular TPI regimen. 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    Cervical Medial Branch Block #1 on 02/05/22: 85% for a couple of weeks, pain has now returned to baseline (TPI's only transiently help neck pain compared to the MBBs)     Has serial consent from 03/11/2022     After risks and benefits were explained including bleeding, infection, worsening of the pain, damage to the area being injected, weakness, allergic reaction to medications, vascular injection, pneumothorax and nerve damage, signed consent was obtained.  All questions were answered.   The patient is not taking antiplatelet or anticoagulation medications and does not have a driver today.     The area of the trigger point was identified and the skin prepped with chlorhexidine.  Next, a 25 gauge 1.5 inch needle was placed in the area of the trigger points in the lumbar paraspinals and the gluteus medius; a 27 gauge 0.5 inch needle was used for everything else above.  Dry needling was performed in the area of the trigger point.  Once reproduction of the pain was elicited and negative aspiration confirmed, the trigger point was injected with 0.5-1 mL of 0.25% bupivacaine and the needle removed.      The patient did tolerate the procedure well and there were not complications.       Trigger points injected: 30     Trigger point locations:  bilateral cervical, thoracic and lumbar paraspinals, bilateral trapezius, rhomboids, and levator scapulae, bilateral quadratus, and gluteus medius     The patient was monitored for 15 minutes after the procedure.  Vital signs remained normal and the patient ambulated out of clinic     Pre-procedure pain score: 6/10  Post-procedure pain score: 7/10     DISPO:  - Repeat TPI ordered for 8 weeks  - Schedule cervical MBB #2  - Pain psych on 2/27 and appt with Al Corpus, PharmD on 3/14    After failing conservative measures, through the process of shared decision making, we have decided to move forward with the below procedure.  Procedure ordered: Bilateral cervical MBB#2  Had previously?: Yes.. Last: 02/05/22  If yes, results (% relief, %functional improvement, and duration): 85% relief for several weeks, then returned to baseline  HEP: Yes.-teaches water aerobics!  Other exercise as well  Last PT: Not recent given her regular HEP  Anticoagulants: None.  Instructions?  N/A  Labs required?: No.  Driver: Yes.  PIV: No.  Risk category (consider age, hx bleeding issues, anticoagulants, liver dx, kidney dx): intermediate  Expected timing: Next available  Length of procedure: 30 minutes  Pertinent physical exam findings: Positive pain over cervical facets bilaterally, positive facet loading bilaterally        Orders Placed This Encounter  Procedures    Trigger Point Injs 3+ Muscles [phone])     Lobonc, 15 minutes ,repeat TPI, no driver, no PIV, spine or quad, 2 months     Standing Status:   Future     Standing Expiration Date:   03/12/2023    Facet Inj Cerv/Thor Single Level (64490)     Lobonc, 30 minutes,  BilateralC4, C5, C6 Cervical Medial Branch Nerve Blocks, driver, no PIV, spine or quad, next availabe     Standing Status:   Future     Standing Expiration Date:   03/12/2023    Facet Inj Cerv/Thor 2nd Level (64491)     Lobonc, 30 minutes,  BilateralC4, C5, C6 Cervical Medial Branch Nerve Blocks, driver, no PIV, spine or quad, next availabe     Standing Status:   Future     Standing Expiration Date:   03/12/2023

## 2022-03-19 ENCOUNTER — Ambulatory Visit: Admit: 2022-03-19 | Payer: MEDICARE

## 2022-03-22 ENCOUNTER — Ambulatory Visit: Admit: 2022-03-22 | Discharge: 2022-03-23 | Payer: MEDICARE

## 2022-03-22 DIAGNOSIS — R109 Unspecified abdominal pain: Principal | ICD-10-CM

## 2022-03-22 DIAGNOSIS — G44221 Chronic tension-type headache, intractable: Principal | ICD-10-CM

## 2022-03-22 DIAGNOSIS — G43019 Migraine without aura, intractable, without status migrainosus: Principal | ICD-10-CM

## 2022-03-22 DIAGNOSIS — R319 Hematuria, unspecified: Principal | ICD-10-CM

## 2022-03-22 MED ORDER — NORTRIPTYLINE 25 MG CAPSULE
ORAL_CAPSULE | Freq: Every evening | ORAL | 1 refills | 90 days | Status: CP
Start: 2022-03-22 — End: 2022-09-18

## 2022-03-22 NOTE — Unmapped (Signed)
Assessment/Plan:      Natalie [name] was seen today for acute lower back pain.    Diagnoses and all orders for this visit:    Flank pain  -     POCT urinalysis dipstick; Future  -     POCT urinalysis dipstick  -     XR Abdomen 1 View; Future  -     UA is positive of hematuria and having severe pain, most likely kidney stone, will order xray of  abdomen.  Increase water intake, already on oxycodone for pain.        Hematuria, unspecified type  -     Urine Culture; Future  -     XR Abdomen 1 View; Future        No follow-ups on file.     Diagnosis and plan along with any newly prescribed medication(s) were discussed in detail with this patient today. The patient verbalized understanding and agreed with the plan without language barriers or behavioral barriers to understanding unless otherwise noted.    Subjective:   HPI: Natalie [name] is a 52 y.o. female is here for    Chief Complaint   Patient presents with    Acute Lower Back Pain     Frequent urination, lower flank pain     Pt presents today c/o left sided back pain x 1 day.   Reports, pain is a constant  achy pain.  Feels like a constant cramp.   Pain does not radiate.  Admits to urinary frequency.  Denies urgency and dysuria.  Has blood in urine.   Drinks about 4 - 6 cups of water a day.  Has taken pain medications for symptoms, which did not help.  Rates pain 8/10.  Denies fever and chills.      ROS:     Negative unless noted in hpi    Vital Signs:     Wt Readings from Last 3 Encounters:   03/26/22 76.2 kg (168 lb)   03/25/22 76.7 kg (169 lb 3.2 oz)   03/22/22 75.8 kg (167 lb)     Temp Readings from Last 3 Encounters:   03/25/22 36.7 ??C (98 ??F) (Temporal)   03/22/22 36.7 ??C (98 ??F)   03/11/22 36.2 ??C (97.2 ??F) (Skin)     BP Readings from Last 3 Encounters:   03/26/22 115/71   03/25/22 114/70   03/22/22 122/80     Pulse Readings from Last 3 Encounters:   03/25/22 132   03/22/22 87   03/11/22 92     Body mass index is 31.55 kg/m??.    Objective:      Physical Exam  Constitutional:       Appearance: Normal appearance.   HENT:      Head: Normocephalic and atraumatic.   Eyes:      Conjunctiva/sclera: Conjunctivae normal.      Pupils: Pupils are equal, round, and reactive to light.   Cardiovascular:      Rate and Rhythm: Normal rate and regular rhythm.      Pulses: Normal pulses.      Heart sounds: Normal heart sounds.   Pulmonary:      Effort: Pulmonary effort is normal.      Breath sounds: Normal breath sounds.   Abdominal:      General: Abdomen is flat. Bowel sounds are normal.      Palpations: Abdomen is soft.      Tenderness: There is abdominal tenderness (Right sided CVA tenderness).  Skin:     General: Skin is warm and dry.   Neurological:      General: No focal deficit present.      Mental Status: She is alert and oriented to person, place, and time.   Psychiatric:         Mood and Affect: Mood normal.         Behavior: Behavior normal.           Labs:     Results for orders placed or performed in visit on 03/22/22   POCT urinalysis dipstick   Result Value Ref Range    Color, UA Yellow     Clarity, UA Clear     Glucose, UA Negative Negative    Bilirubin, UA Negative Negative    Ketones, POC Negative Negative    Spec Grav, UA 1.010 1.005 - 1.030    Blood, UA 50 mg/dL Negative    pH, UA 7.0 5.0 - 9.0    Protein, UA Negative Negative    Urobilinogen, UA Normal Negative (0.2 mg/dL)    Leukocytes, UA Negative Negative    Nitrite, UA Negative Negative    STRIP LOT NUMBER [id number]     STRIP LOT EXPIRATION 05/25/22

## 2022-03-22 NOTE — Unmapped (Signed)
Patient is asking if her xray results have come back.

## 2022-03-23 ENCOUNTER — Telehealth: Admit: 2022-03-23 | Discharge: 2022-03-24 | Payer: MEDICARE | Attending: Psychologist | Primary: Psychologist

## 2022-03-23 DIAGNOSIS — N951 Menopausal and female climacteric states: Principal | ICD-10-CM

## 2022-03-23 DIAGNOSIS — N200 Calculus of kidney: Principal | ICD-10-CM

## 2022-03-23 DIAGNOSIS — F1190 Opioid use, unspecified, uncomplicated: Principal | ICD-10-CM

## 2022-03-23 DIAGNOSIS — F4321 Adjustment disorder with depressed mood: Principal | ICD-10-CM

## 2022-03-23 DIAGNOSIS — F419 Anxiety disorder, unspecified: Principal | ICD-10-CM

## 2022-03-23 DIAGNOSIS — R072 Precordial pain: Principal | ICD-10-CM

## 2022-03-23 MED ORDER — PANTOPRAZOLE 40 MG TABLET,DELAYED RELEASE
ORAL_TABLET | Freq: Every day | ORAL | 3 refills | 30 days | Status: CP | PRN
Start: 2022-03-23 — End: ?

## 2022-03-23 MED ORDER — ESTRADIOL 0.5 MG TABLET
ORAL_TABLET | 0 refills | 0 days
Start: 2022-03-23 — End: ?

## 2022-03-23 MED ORDER — TAMSULOSIN 0.4 MG CAPSULE
ORAL_CAPSULE | Freq: Every day | ORAL | 0 refills | 10 days | Status: CP
Start: 2022-03-23 — End: 2022-04-02

## 2022-03-23 NOTE — Unmapped (Signed)
[facility]   Confidential Psychological Therapy Session    Patient Name: Natalie [name]  Medical Record Number: [id number]  Date of Service: March 23, 2022  Attending Psychologist: Caroline More, PhD  CPT Procedure Code: 81191 for 60 minutes of face to face counseling    This visit was performed face to face with interactive technology using a HIPPA compliant audio/visual platform. We reviewed confidentiality today. The patient was present in [place], a state in which this provider is licensed and able to provide care (location and contact information confirmed), attended this visit alone, and consented to this virtual pain psychology visit.    REFERRING PHYSICIAN: Criss Rosales, MD    CHIEF COMPLAINT AND REASON FOR REFERRAL:  COM follow up evaluation/pain coping skills; CBT/ACT for pain; grief    SUBJECTIVE / HISTORY OF PRESENT ILLNESS: [name] is a very pleasant 52 y.o.  female with chronic lower back pain, chronic headaches, and myofascial pain. Her pain started in 1999 in her lower back.  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  She had a spinal fusion in 2001 at the L5-S1 level.  She has had bilateral shoulder pain, neck pain, lower back pain, and b/l knees.  She also described a radiculopathy b/l that goes from her back to her bilateral buttocks and down to her toes.      Pt initially established with Dr. Oda Kilts in 08/2016 and then was was first evaluated by me in 07/2017. Last follow up with me was 02/23/22.  She had court earlier this week and is upset and frustrated. We processed thoughts and feelings about the situation, including how it impacts her self image, anxiety and pain. Brainstormed how to proceed, given her options. She discusses how stress impacts her body, including physiological agitation as well as muscle tightness.  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 Also talks about the benefits of knowing she is a positive healing and fitness power in her class' lives.     Spent extra time addressing family stressors and dynamics and how that impacts her stress and role in the family. She reports good adherence to her pain management regimen.      OBJECTIVE / MENTAL STATUS:    Appearance:   Appears stated age and Clean/Neat   Motor:  No abnormal movements   Speech/Language:   Normal rate, volume, tone, fluency   Mood:  Euthymic, improved   Affect:  Bright! Mood congruent   Thought process:  Logical, linear, clear, coherent, goal directed   Thought content:    Denies SI, HI, self harm, delusions, obsessions, paranoid ideation, or ideas of reference   Perceptual disturbances:    Denies auditory and visual hallucinations, behavior not concerning for response to internal stimuli   Orientation:  Oriented to person, place, time, and general circumstances   Attention:  Able to fully attend without fluctuations in consciousness   Concentration:  Able to fully concentrate and attend   Memory:  Immediate, short-term, long-term, and recall grossly intact    Fund of knowledge:   Consistent with level of education and development   Insight:    Fair   Judgment:   Intact   Impulse Control:  Intact       DIAGNOSTIC IMPRESSION:   Anxiety NOS  Chronic continuou use of opiates  Grief    ASSESSMENT:  [name] is a very pleasant 52 y.o.  female from [place], [place] with chronic lower back pain, chronic headaches, and myofascial pain. Her pain started in 1999 in her lower back.  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  She had a spinal fusion in 2001 at the L5-S1 level.  She has had bilateral shoulder pain, neck pain, lower back pain, and b/l knees.  She also described a radiculopathy b/l that goes from her back to her bilateral buttocks and down to her toes.  The patient is currently considered to be high risk due to prior nonadherence, but appropriate with a behavioral adherence plan in place.  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 She continues to participate actively in psychotherapy.    PLAN:   (1) COM - high risk but remains appropriate with a behavioral adherence plan also in place.    -Patient MUST meet with pain psychology/me once a month.   -No additional overuse of opioids will be tolerated.    -Patient should always bring her medication to clinic for pill counts.  -Patient was informed she has had numerous infractions with respect to her pain contract over the years, and that no further infractions will be tolerated.    If any additional pain contract or behavioral adherence contract infractions occur, I recommend stopping opioids.    Pt doing well with adherence, per our last few visits.     (2) Pain coping skills- addressed compliance, as well as pacing and mindful breathing, body scan, and problem solving    (3) follow-in 1 week

## 2022-03-25 ENCOUNTER — Ambulatory Visit: Admit: 2022-03-25 | Discharge: 2022-03-25 | Payer: MEDICARE

## 2022-03-25 DIAGNOSIS — R109 Unspecified abdominal pain: Principal | ICD-10-CM

## 2022-03-25 DIAGNOSIS — R300 Dysuria: Principal | ICD-10-CM

## 2022-03-25 DIAGNOSIS — N3000 Acute cystitis without hematuria: Principal | ICD-10-CM

## 2022-03-25 DIAGNOSIS — R1032 Left lower quadrant pain: Principal | ICD-10-CM

## 2022-03-25 DIAGNOSIS — M5416 Radiculopathy, lumbar region: Principal | ICD-10-CM

## 2022-03-25 LAB — CBC W/ AUTO DIFF
BASOPHILS ABSOLUTE COUNT: 0.1 10*9/L (ref 0.0–0.1)
BASOPHILS RELATIVE PERCENT: 0.9 %
EOSINOPHILS ABSOLUTE COUNT: 0.2 10*9/L (ref 0.0–0.5)
EOSINOPHILS RELATIVE PERCENT: 3.2 %
HEMATOCRIT: 39.4 % (ref 34.0–44.0)
HEMOGLOBIN: 12.9 g/dL (ref 11.3–14.9)
LYMPHOCYTES ABSOLUTE COUNT: 1.9 10*9/L (ref 1.1–3.6)
LYMPHOCYTES RELATIVE PERCENT: 31 %
MEAN CORPUSCULAR HEMOGLOBIN CONC: 32.8 g/dL (ref 32.0–36.0)
MEAN CORPUSCULAR HEMOGLOBIN: 30.4 pg (ref 25.9–32.4)
MEAN CORPUSCULAR VOLUME: 92.6 fL (ref 77.6–95.7)
MEAN PLATELET VOLUME: 11.1 fL — ABNORMAL HIGH (ref 6.8–10.7)
MONOCYTES ABSOLUTE COUNT: 0.7 10*9/L (ref 0.3–0.8)
MONOCYTES RELATIVE PERCENT: 10.9 %
NEUTROPHILS ABSOLUTE COUNT: 3.4 10*9/L (ref 1.8–7.8)
NEUTROPHILS RELATIVE PERCENT: 54 %
PLATELET COUNT: 219 10*9/L (ref 150–450)
RED BLOOD CELL COUNT: 4.25 10*12/L (ref 3.95–5.13)
RED CELL DISTRIBUTION WIDTH: 13.2 % (ref 12.2–15.2)
WBC ADJUSTED: 6.3 10*9/L (ref 3.6–11.2)

## 2022-03-25 LAB — URINALYSIS WITH MICROSCOPY
BILIRUBIN UA: NEGATIVE
GLUCOSE UA: NEGATIVE
HYALINE CASTS: 13 /LPF — ABNORMAL HIGH (ref 0–2)
HYPHAL YEAST: NONE SEEN /HPF
LEUKOCYTE ESTERASE UA: NEGATIVE
NITRITE UA: NEGATIVE
PH UA: 7.5 (ref 5.0–8.0)
PROTEIN UA: NEGATIVE
RBC UA: 11 /HPF — ABNORMAL HIGH (ref 0–3)
SPECIFIC GRAVITY UA: 1.02 (ref 1.016–1.022)
SQUAMOUS EPITHELIAL: 1 /HPF (ref 0–5)
UROBILINOGEN UA: 0.2
WBC CLUMPS: NONE SEEN /HPF
WBC UA: 3 /HPF — ABNORMAL HIGH (ref 0–2)
YEAST: NONE SEEN /HPF

## 2022-03-25 LAB — COMPREHENSIVE METABOLIC PANEL
ALBUMIN: 3.7 g/dL (ref 3.4–5.0)
ALKALINE PHOSPHATASE: 44 U/L — ABNORMAL LOW (ref 46–116)
ALT (SGPT): 19 U/L (ref 10–49)
ANION GAP: 3 mmol/L (ref 3–11)
AST (SGOT): 32 U/L (ref <34)
BILIRUBIN TOTAL: 0.5 mg/dL (ref 0.3–1.2)
BLOOD UREA NITROGEN: 7 mg/dL — ABNORMAL LOW (ref 9–23)
BUN / CREAT RATIO: 11
CALCIUM: 9.4 mg/dL (ref 8.7–10.4)
CHLORIDE: 101 mmol/L (ref 98–107)
CO2: 34 mmol/L — ABNORMAL HIGH (ref 20.0–31.0)
CREATININE: 0.65 mg/dL (ref 0.55–1.02)
EGFR CKD-EPI (2021) FEMALE: >90 mL/min/{1.73_m2} (ref >=60)
GLUCOSE RANDOM: 91 mg/dL (ref 70–99)
POTASSIUM: 3.5 mmol/L (ref 3.5–5.1)
PROTEIN TOTAL: 7.1 g/dL (ref 5.7–8.2)
SODIUM: 138 mmol/L (ref 136–145)

## 2022-03-25 LAB — LIPASE: LIPASE: 27 U/L (ref 12–53)

## 2022-03-25 LAB — AMYLASE: AMYLASE: 104 U/L (ref 30–118)

## 2022-03-25 MED ORDER — NITROFURANTOIN MONOHYDRATE/MACROCRYSTALS 100 MG CAPSULE
ORAL_CAPSULE | Freq: Two times a day (BID) | ORAL | 0 refills | 5 days | Status: CP
Start: 2022-03-25 — End: 2022-03-30

## 2022-03-25 MED ORDER — METHYLPREDNISOLONE 4 MG TABLETS IN A DOSE PACK
0 refills | 0 days | Status: CP
Start: 2022-03-25 — End: ?

## 2022-03-25 NOTE — Unmapped (Signed)
Pt states that she just left the office and provider was unsure if she could be prescribed something for pain.  She states that she just got off the phone with her pain management office and they stated that she could.  Please reach out to patient.

## 2022-03-25 NOTE — Unmapped (Signed)
[facility]   Patient Onboarding/Medication Counseling    Ms.Hanaway is a 52 y.o. female with chronic idiopathic urticaria who I am counseling today on continuation of therapy.  I am speaking to the patient.    Was a Nurse, learning disability used for this call? No    Verified patient's date of birth / HIPAA.    Specialty medication(s) to be sent: Inflammatory Disorders: Xolair      Non-specialty medications/supplies to be sent: sharps container      Medications not needed at this time: n/a       The patient declined counseling on medication administration, missed dose instructions, goals of therapy, side effects and monitoring parameters, warnings and precautions, drug/food interactions, and storage, handling precautions, and disposal because they have taken the medication previously. The information in the declined sections below are for informational purposes only and was not discussed with patient.       Xolair Syringes (omalizumab)    Medication & Administration     Dosage: Inject 300 mg under the skin every 28 days    Administration:     Vials: Must be administered as a subcutaneous injection in the abdomen or thighs by a healthcare professional. Patient will be observed after receiving first 3 injections in clinic before moving to home administration.    Prefilled Syringe:  Home administration screening questions:    Has patient ever had an anaphylaxis reaction to Xolair or other agents, such as foods, drugs, biologics, etc? No    Has patient received at least 3 doses in clinic under the supervision of a healthcare proefssional? Yes    Does the patient have an Epi-pen to use for possible anaphylaxis reaction? Yes    Injection administration:   Gather all supplies needed for injection on a clean, flat working surface: medication syringe(s) removed from packaging, alcohol swab, sharps container, etc.  Look at the medication label - look for correct medication, correct dose, and check the expiration date  Look at the medication - the liquid in the syringe should appear clear and colorless to slightly yellow, you may see a few white particles  Lay the syringe on a flat surface and allow it to warm up to room temperature for at least 15-30 minutes  Select injection site - you can use the front of your thigh or your belly (but not the area 2 inches around your belly button); if someone else is giving you the injection you can also use your upper arm in the skin covering your triceps muscle or in the buttocks  Prepare injection site - wash your hands and clean the skin at the injection site with an alcohol swab and let it air dry, do not touch the injection site again before the injection  Pull off the needle safety cap, do not remove until immediately prior to injection  Pinch the skin - with your hand not holding the syringe pinch up a fold of skin at the injection site using your forefinger and thumb  Insert the needle into the fold of skin at about a 45 degree angle - it's best to use a quick dart-like motion  Push the plunger down slowly as far as it will go until the syringe is empty, if the plunger is not fully depressed the needle shield will not extend to cover the needle when it is removed, hold the syringe in place for a full 5 seconds  Check that the syringe is empty and keep pressing down  on the plunger while you pull the needle out at the same angle as inserted; after the needle is removed completely from the skin, release the plunger allowing the needle shield to activate and cover the used needle  Dispose of the used syringe immediately in your sharps disposal container, do not attempt to recap the needle prior to disposing  If you see any blood at the injection site, press a cotton ball or gauze on the site and maintain pressure until the bleeding stops, do not rub the injection site    Adherence/Missed dose instructions: If you miss a dose take as soon as you remember.  Resume the correct dosing schedule.        Goals of Therapy     To treat asthma and or chronic idiopathic urticaria    Side Effects & Monitoring Parameters     Commonly reported side effects  Headache  Nausea, vomiting   Injection site reaction  Loss of strength and energy  Common cold symptoms, sore throat, stuffy nose  Ear pain  Painful extremities     The following side effects should be reported to the provider:  Signs of cerebrovascular disease (change in strength on one side is greater than the other, trouble speaking or thinking, change in balance or vision changes)  Signs of DVT (swelling, warmth, numbness, change in color or pain in extremities)  Signs of anaphylaxis (wheezing, chest tightness, swelling of face, lips, tongue or throat)    Monitoring Parameters:   Anaphylactic/hypersensitivity reactions (observe patients for 2 hours after the first 3 injections and 30 minutes after subsequent injections or in accordance with individual institution policies and procedures);   Baseline serum total IgE; FEV1, peak flow, and/or other pulmonary function tests  Monitor for signs of infection    Contraindications, Warnings, & Precautions   [place] Boxed Warning]: Anaphylaxis, including delayed-onset anaphylaxis, has been reported following administration; anaphylaxis may present as bronchospasm, hypotension, syncope, urticaria, and/or angioedema of the throat or tongue. Anaphylaxis has occurred after the first dose and in some cases >1 year after initiation of regular treatment. Due to the risk, patients should be observed closely for an appropriate time period after administration and should receive treatment only under direct medical supervision. Healthcare providers should be prepared to administer appropriate therapy for managing potentially life-threatening anaphylaxis. Patients should be instructed on identifying signs/symptoms of anaphylaxis and to seek immediate care if they arise.      Contraindications  Severe hypersensitivity reaction to omalizumab or any component of the formulation    Warnings & Precautions  Cardiovascular effects: Cerebrovascular events, including transient ischemic attack and ischemic stroke, have been reported.  Eosinophilia and vasculitis: In rare cases, patients may present with systemic eosinophilia, sometimes presenting with clinical features of vasculitis.    Fever/arthralgia/rash: Reports of a constellation of symptoms including fever, arthritis or arthralgia, rash, and lymphadenopathy have been reported with post-marketing use.  Malignant neoplasms: Have been reported rarely with use in short-term studies; impact of long-term use is not known.  Parasitic infections: Use with caution and monitor patients at high risk for parasitic infections; risk of infection may be increased; appropriate duration of continued monitoring following therapy discontinuation has not been established.  Corticosteroid therapy: Gradually taper systemic or inhaled corticosteroid therapy; do not discontinue corticosteroids abruptly following initiation of omalizumab therapy. The combined use of omalizumab and corticosteroids in patients with chronic idiopathic urticaria has not been evaluated.  Latex: Prefilled syringe: The needle cap may contain natural rubber latex.  Appropriate use: Therapy has not been shown to alleviate acute asthma exacerbations; do not use to treat acute bronchospasm, status asthmaticus, or other allergic conditions. Do not use to treat forms of urticaria other than chronic idiopathic urticaria.  Dosing/IgE levels: Dosing for asthma is based on body weight and pretreatment total IgE serum levels. IgE levels remain elevated up to 1 year following treatment; therefore, levels taken during treatment or for up to 1 year following treatment cannot and should not be used as a dosage guide. Dosing in chronic idiopathic urticaria is not dependent on serum IgE (free or total) level or body weight.  Pregnancy Considerations: Omalizumab is a humanized monoclonal antibody (IgG1). Potential placental transfer of human IgG is dependent upon the IgG subclass and gestational age, generally increasing as pregnancy progresses.  Breastfeeding Considerations: It is not known if omalizumab is present in breast milk; however, IgG is excreted in human milk.  Based on information from the pregnancy exposure registry, an increased risk of adverse events was not observed in breastfed infants of mothers using omalizumab. According to the manufacturer, the decision to breastfeed during therapy should consider the risk of infant exposure, the benefits of breastfeeding to the infant, and benefits of treatment to the mother      Drug/Food Interactions     Medication list reviewed in Epic. The patient was instructed to inform the care team before taking any new medications or supplements. No drug interactions identified.     Storage, Handling Precautions, & Disposal     Store this medication in the refrigerator, 2??C to 8??C (36??F to 46??F). in the original carton.  Protect from direct sunlight and do not freeze. Must be used within 4 hours after removal from refrigerator.  Do not shake.  Dispose of used syringes in a sharps disposal container.      Current Medications (including OTC/herbals), Comorbidities and Allergies     Current Outpatient Medications   Medication Sig Dispense Refill    albuterol HFA 90 mcg/actuation inhaler Inhale 2 puffs every four (4) hours as needed for wheezing. 25.5 g 0    azelastine (ASTELIN) 137 mcg (0.1 %) nasal spray 2 sprays into each nostril Two (2) times a day. 30 mL 12    azelastine (OPTIVAR) 0.05 % ophthalmic solution Administer 2 drops to both eyes daily as needed. 6 mL 5    baclofen (LIORESAL) 20 MG tablet 10 - 20 mg three times a day as needed for spasms. 90 tablet 2    cetirizine (ZYRTEC) 10 MG chewable tablet Chew 1 tablet (10 mg total) at bedtime as needed for allergies.      cetirizine (ZYRTEC) 10 MG tablet diclofenac sodium (PENNSAID) 20 mg/gram /actuation(2 %) sopm Apply 2 pumps to affected area up to four times a day. 112 g 2    EPINEPHrine (EPIPEN) 0.3 mg/0.3 mL injection Inject 0.3 mL (0.3 mg total) into the muscle once for 1 dose. 2 each 0    EPINEPHrine (EPIPEN) 0.3 mg/0.3 mL injection Inject 0.3 mL (0.3 mg total) into the muscle once as needed for anaphylaxis (Difficulty breathing, throat closing, etc) for up to 1 dose. 1 each 12    estradioL (ESTRACE) 0.5 MG tablet Take 1 tablet (0.5 mg total) by mouth daily. 30 tablet 1    gabapentin (NEURONTIN) 800 MG tablet Take 1 tablet (800 mg total) by mouth two (2) times a day. 180 tablet 0    hydroCHLOROthiazide (HYDRODIURIL) 12.5 MG tablet TAKE 1 TABLET(12.5 MG) BY MOUTH DAILY  AS NEEDED FOR SWELLING 30 tablet 5    montelukast (SINGULAIR) 10 mg tablet Take 1 tablet (10 mg total) by mouth nightly. 30 tablet 2    naloxone (NARCAN) 4 mg nasal spray One spray in either nostril once for known/suspected opioid overdose. May repeat every 2-3 minutes in alternating nostril til EMS arrives 1 each 0    nortriptyline (PAMELOR) 25 MG capsule Take 1 capsule (25 mg total) by mouth nightly. 90 capsule 1    olopatadine (PATANOL) 0.1 % ophthalmic solution Administer 1 drop to both eyes Two (2) times a day. 5 mL 1    omalizumab (XOLAIR) 150 mg/mL syringe Inject the contents of 2 syringes (300 mg total) under the skin every twenty-eight (28) days. 2 mL 11    oxyCODONE-acetaminophen (PERCOCET) 10-325 mg per tablet Take 1 tablet by mouth every six (6) hours as needed for pain. Max 4 tabs/day. Fill on or after: 02/06/22. Brand name only due to allergy. 120 tablet 0    oxyCODONE-acetaminophen (PERCOCET) 10-325 mg per tablet Take 1 tablet by mouth every six (6) hours as needed for pain. Max 4 tabs/day. Fill on or after: 03/08/22. Brand name only due to allergy. (Patient not taking: Reported on 03/22/2022) 120 tablet 0    [START ON 04/07/2022] oxyCODONE-acetaminophen (PERCOCET) 10-325 mg per tablet Take 1 tablet by mouth every six (6) hours as needed for pain. Max 4 tabs/day. Fill on or after: 04/07/22. Brand name only due to allergy. (Patient not taking: Reported on 03/22/2022) 120 tablet 0    pantoprazole (PROTONIX) 40 MG tablet Take 1 tablet (40 mg total) by mouth daily as needed (take it 30 mins prior to breakfast). 30 tablet 3    potassium chloride 20 MEQ CR tablet Take 1 tablet (20 mEq total) by mouth daily. 90 tablet 2    predniSONE (DELTASONE) 20 MG tablet Take 1 tablet (20 mg total) by mouth daily. (Patient not taking: Reported on 03/22/2022) 4 tablet 0    PROCHAMBER Spcr       rizatriptan (MAXALT-MLT) 10 MG disintegrating tablet Take 1 tablet by mouth at ONSET of migraine or aura. May repeat in 2 HOURS if needed. DO NOT EXCEED 2 doses/day, 2 days/week, 8 doses/month. 10 tablet 5    rosuvastatin (CRESTOR) 10 MG tablet TAKE 1 TABLET(10 MG) BY MOUTH EVERY NIGHT 90 tablet 0    tamsulosin (FLOMAX) 0.4 mg capsule Take 1 capsule (0.4 mg total) by mouth daily for 10 days. 10 capsule 0    triamcinolone (KENALOG) 0.1 % cream Apply 1 application topically two (2) times a day as needed. WHEN ALLERGIES FLARE-UP AND CAUSES RASH / ITCHING 80 g 0    XHANCE 93 mcg/actuation AerB 1 spray into each nostril two (2) times a day as needed. 16 mL 11     Current Facility-Administered Medications   Medication Dose Route Frequency Provider Last Rate Last Admin    omalizumab Geoffry Paradise) injection 300 mg  300 mg Subcutaneous Q28 Days Volertas, Sofija Dalia, MD   300 mg at 03/09/22 1441       Allergies   Allergen Reactions    Buprenorphine Hcl Itching and Swelling    Pregabalin Swelling    Adhesive Tape-Silicones Itching     Band-aids ok.    Doxycycline Hyclate (Bulk) Nausea And Vomiting     GI Upset    Keflex [Cephalexin] Rash    Opioids - Morphine Analogues Itching    Oxycodone-Acetaminophen Itching and Nausea And Vomiting  GI Upset- GENERIC ONLY- able to take the brand name Percocets       Patient Active Problem List Diagnosis    Hypothyroidism    Chronic pain syndrome    Encounter for Medicare annual wellness exam    Premature menopause    Facet arthritis of lumbar region    Pseudotumor cerebri    Allergic rhinitis    Arthritis of wrist, right    Primary osteoarthritis of both knees    Trochanteric bursitis of both hips    Migraine without aura and without status migrainosus, not intractable    Myofascial pain syndrome    Allergic conjunctivitis    Rash due to allergy    Uncomplicated opioid dependence (CMS-HCC)    Obesity (BMI 30-39.9)    Educated about COVID-19 virus infection    Chronic tension-type headache, intractable    Greater trochanteric bursitis of right hip    Primary osteoarthritis of right knee    Primary osteoarthritis of left knee    Pain medication agreement signed       Reviewed and up to date in Epic.    Appropriateness of Therapy     Acute infections noted within Epic:  No active infections  Patient reported infection: None    Is medication and dose appropriate based on diagnosis and infection status? Yes    Prescription has been clinically reviewed: Yes      Baseline Quality of Life Assessment      How many days over the past month did your idiopathic urticaria  keep you from your normal activities? For example, brushing your teeth or getting up in the morning. Ms. Lezama reports having breakthrough hives 1-1.5 weeks before next Xolair dose is due. She has discussed this with provider and may have dosing frequency increased to every 3 weeks if needed. She is aware we will need a new prescription if dosing changes.     Financial Information     Medication Assistance provided: None Required    Anticipated copay of $0 reviewed with patient. Verified delivery address.    Delivery Information     Scheduled delivery date: 04/01/22    Expected start date: Continuation of therapy - next dose due 3/12    Patient was notified of new phone menu: No    Medication will be delivered via Same Day Courier to the prescription address in [facility].  This shipment will not require a signature.      Explained the services we provide at [facility] and that each month we would call to set up refills. Stressed importance of returning phone calls so that we could ensure they receive their medications in time each month. Informed patient that we should be setting up refills 7-10 days prior to when they will run out of medication.  A pharmacist will reach out to perform a clinical assessment periodically. Informed patient that a welcome packet, containing information about our pharmacy and other support services, a Notice of Privacy Practices, and a drug information handout will be sent.      The patient or caregiver noted above participated in the development of this care plan and knows that they can request review of or adjustments to the care plan at any time.      Patient or caregiver verbalized understanding of the above information as well as how to contact the pharmacy at [phone] option 4 with any questions/concerns.  The pharmacy is open Monday through Friday 8:30am-4:30pm.  A  pharmacist is available 24/7 via pager to answer any clinical questions they may have.    Patient Specific Needs     Does the patient have any physical, cognitive, or cultural barriers? No    Does the patient have adequate living arrangements? (i.e. the ability to store and take their medication appropriately) Yes    Did you identify any home environmental safety or security hazards? No    Patient prefers to have medications discussed with  Patient     Is the patient or caregiver able to read and understand education materials at a high school level or above? Yes    Patient's primary language is  English     Is the patient high risk? No    SOCIAL DETERMINANTS OF HEALTH     At the [facility], we have learned that life circumstances - like trouble affording food, housing, utilities, or transportation can affect the health of many of our patients.   That is why we wanted to ask: are you currently experiencing any life circumstances that are negatively impacting your health and/or quality of life? Patient declined to answer    Social Determinants of Health     Financial Resource Strain: Low Risk  (03/22/2022)    Overall Financial Resource Strain (CARDIA)     Difficulty of Paying Living Expenses: Not hard at all   Internet Connectivity: Not on file   Food Insecurity: No Food Insecurity (03/22/2022)    Hunger Vital Sign     Worried About Running Out of Food in the Last Year: Never true     Ran Out of Food in the Last Year: Never true   Tobacco Use: Low Risk  (03/22/2022)    Patient History     Smoking Tobacco Use: Never     Smokeless Tobacco Use: Never     Passive Exposure: Past   Housing/Utilities: Low Risk  (03/22/2022)    Housing/Utilities     Within the past 12 months, have you ever stayed: outside, in a car, in a tent, in an overnight shelter, or temporarily in someone else's home (i.e. couch-surfing)?: No     Are you worried about losing your housing?: No     Within the past 12 months, have you been unable to get utilities (heat, electricity) when it was really needed?: No   Alcohol Use: Not At Risk (08/21/2019)    Alcohol Use     How often do you have a drink containing alcohol?: Never     How many drinks containing alcohol do you have on a typical day when you are drinking?: Not on file     How often do you have 5 or more drinks on one occasion?: Never   Transportation Needs: No Transportation Needs (03/22/2022)    PRAPARE - Transportation     Lack of Transportation (Medical): No     Lack of Transportation (Non-Medical): No   Substance Use: Not on file   Health Literacy: Low Risk  (03/22/2022)    Health Literacy     : Never   Physical Activity: Not on file   Interpersonal Safety: Not on file   Stress: Not on file   Intimate Partner Violence: Not At Risk (03/22/2022)    Humiliation, Afraid, Rape, and Kick questionnaire     Fear of Current or Ex-Partner: No     Emotionally Abused: No     Physically Abused: No     Sexually Abused: No   Depression: Not at  risk (04/01/2020)    PHQ-2     PHQ-2 Score: 0   Social Connections: Not on file       Would you be willing to receive help with any of the needs that you have identified today? Not applicable       Oliva Bustard, PharmD  [facility] Specialty Pharmacist

## 2022-03-25 NOTE — Unmapped (Signed)
Name:  Natalie [name]  DOB: 1970/03/29  Today's Date: 03/25/2022  Age:  52 y.o.    Assessment/Plan:      There are no diagnoses linked to this encounter.    Diagnosis and plan along with any newly prescribed medication(s) were discussed in detail with this patient today. The patient verbalized understanding and agreed with the plan without language barriers or behavioral barriers to understanding unless otherwise noted.    Subjective:      HPI: Natalie [name] is a 52 y.o. female is here for    Chief Complaint   Patient presents with    Abdominal Pain     Left side    Numbness    Flank Pain     Left     Pain in left flank region  for 5 days.  She feels pain is radiating forward into left lower quadrant.  She feels like she has urge to pass urine, and she has burning pain while passing urine.    Nausea.  She has been constipated for a week, and started having medium sized BMs yesterday.    She has had on and off left lower quadrant pain for more than a month.    Recent X-RAY ABDOMEN on 03/18/22: 4 mm calcification in right hemipelvis.       Past Medical/Surgical History:     Past Medical History:   Diagnosis Date    Allergic conjunctivitis 11/23/2018    Anemia 1986    Arthritis     Breast mass     Chondromalacia of left knee     Chondromalacia of right knee     Depressive disorder     Fibrocystic breast     GERD (gastroesophageal reflux disease)     Hematuria     History of kidney stones     History of transfusion     Hyperlipidemia 1999    Hypothyroidism     Lumbar disc disease 1998    She was injured at work at the age of 56 and then had disk surgery 2 years later     Menopause ovarian failure     Menopause, premature     Migraine with aura     Neuromuscular disorder (CMS-HCC) 1999    Obesity (BMI 30-39.9) 11/23/2018    Ovarian cyst     Plantar fasciitis     Pseudotumor cerebri     Rash due to allergy 11/23/2018    Sickle cell trait (CMS-HCC)     Urinary tract infection     Vaginitis      Past Surgical History:   Procedure Laterality Date    BACK SURGERY  2001    BREAST BIOPSY Bilateral     When patient was 16 & 17 Both Negative    HYSTERECTOMY  2008    PR REMOVAL OF TONSILS,13+ Y/O Bilateral 10/10/2019    Procedure: TONSILLECTOMY;  Surgeon: Lona Millard, MD;  Location: OR [facility];  Service: ENT    SALPINGOOPHORECTOMY Bilateral 2007    SPINE SURGERY  2001    TOTAL VAGINAL HYSTERECTOMY  01/04/2006    Uterine Prolapse-Dr. Leeanne Rio OP Note    TUBAL LIGATION  1995       Family History:     Family History   Problem Relation Age of Onset    Heart attack Father     Mental illness Father     Asthma Father     Hypertension Mother     Heart disease  Mother     Arthritis Mother     Depression Mother     Drug abuse Mother     Mental illness Mother     Ulcers Mother     COPD Mother     Heart attack Maternal Grandmother     Heart disease Maternal Grandmother     Hypertension Maternal Grandmother     Thyroid disease Maternal Grandmother     Breast cancer Cousin 61    Cancer Sister         lymphoma    COPD Maternal Aunt     Depression Son     Drug abuse Son     Mental illness Son     Stroke Paternal Grandfather     Thyroid disease Other     Alcohol abuse Neg Hx        Social History:     Social History     Socioeconomic History    Marital status: Single     Spouse name: None    Number of children: 2    Years of education: 15    Highest education level: None   Occupational History     Employer: NOT EMPLOYED   Tobacco Use    Smoking status: Never     Passive exposure: Past    Smokeless tobacco: Never   Vaping Use    Vaping status: Never Used   Substance and Sexual Activity    Alcohol use: Never    Drug use: Never    Sexual activity: Yes     Partners: Male     Birth control/protection: Post-menopausal, Surgical     Comment: steady partner for 4 yrs, as on 04/03/19.   Social History Narrative    Marital Status - Single     Children - Son(s) [2]    Pets - Dog (1) Turtle (1)     Household - Lives with sons and grandson Ephriam Knuckles)     Occupation - Disabled since 2002    Tobacco/Alcohol/Drug Use - Denies      Diet - Regular    Exercise/Sports - Walking     Hobbies - Movies     Education - [employer] (Engineer, agricultural)     Country of Origin - [place]                      Social Determinants of Health     Financial Resource Strain: Low Risk  (03/22/2022)    Overall Financial Resource Strain (CARDIA)     Difficulty of Paying Living Expenses: Not hard at all   Food Insecurity: No Food Insecurity (03/22/2022)    Hunger Vital Sign     Worried About Running Out of Food in the Last Year: Never true     Ran Out of Food in the Last Year: Never true   Transportation Needs: No Transportation Needs (03/22/2022)    PRAPARE - Therapist, art (Medical): No     Lack of Transportation (Non-Medical): No       Allergies:     Buprenorphine hcl, Pregabalin, Adhesive tape-silicones, Doxycycline hyclate (bulk), Keflex [cephalexin], Opioids - morphine analogues, and Oxycodone-acetaminophen    Current Medications:     Current Outpatient Medications   Medication Sig Dispense Refill    albuterol HFA 90 mcg/actuation inhaler Inhale 2 puffs every four (4) hours as needed for wheezing. 25.5 g 0    azelastine (ASTELIN) 137 mcg (0.1 %) nasal spray  2 sprays into each nostril Two (2) times a day. 30 mL 12    azelastine (OPTIVAR) 0.05 % ophthalmic solution Administer 2 drops to both eyes daily as needed. 6 mL 5    baclofen (LIORESAL) 20 MG tablet 10 - 20 mg three times a day as needed for spasms. 90 tablet 2    cetirizine (ZYRTEC) 10 MG chewable tablet Chew 1 tablet (10 mg total) at bedtime as needed for allergies.      cetirizine (ZYRTEC) 10 MG tablet       diclofenac sodium (PENNSAID) 20 mg/gram /actuation(2 %) sopm Apply 2 pumps to affected area up to four times a day. 112 g 2    EPINEPHrine (EPIPEN) 0.3 mg/0.3 mL injection Inject 0.3 mL (0.3 mg total) into the muscle once for 1 dose. 2 each 0    EPINEPHrine (EPIPEN) 0.3 mg/0.3 mL injection Inject 0.3 mL (0.3 mg total) into the muscle once as needed for anaphylaxis (Difficulty breathing, throat closing, etc) for up to 1 dose. 1 each 12    estradioL (ESTRACE) 0.5 MG tablet Take 1 tablet (0.5 mg total) by mouth daily. 30 tablet 1    gabapentin (NEURONTIN) 800 MG tablet Take 1 tablet (800 mg total) by mouth two (2) times a day. 180 tablet 0    hydroCHLOROthiazide (HYDRODIURIL) 12.5 MG tablet TAKE 1 TABLET(12.5 MG) BY MOUTH DAILY AS NEEDED FOR SWELLING 30 tablet 5    montelukast (SINGULAIR) 10 mg tablet Take 1 tablet (10 mg total) by mouth nightly. 30 tablet 2    naloxone (NARCAN) 4 mg nasal spray One spray in either nostril once for known/suspected opioid overdose. May repeat every 2-3 minutes in alternating nostril til EMS arrives 1 each 0    nortriptyline (PAMELOR) 25 MG capsule Take 1 capsule (25 mg total) by mouth nightly. 90 capsule 1    olopatadine (PATANOL) 0.1 % ophthalmic solution Administer 1 drop to both eyes Two (2) times a day. 5 mL 1    omalizumab (XOLAIR) 150 mg/mL syringe Inject the contents of 2 syringes (300 mg total) under the skin every twenty-eight (28) days. 2 mL 11    oxyCODONE-acetaminophen (PERCOCET) 10-325 mg per tablet Take 1 tablet by mouth every six (6) hours as needed for pain. Max 4 tabs/day. Fill on or after: 02/06/22. Brand name only due to allergy. 120 tablet 0    oxyCODONE-acetaminophen (PERCOCET) 10-325 mg per tablet Take 1 tablet by mouth every six (6) hours as needed for pain. Max 4 tabs/day. Fill on or after: 03/08/22. Brand name only due to allergy. (Patient not taking: Reported on 03/22/2022) 120 tablet 0    [START ON 04/07/2022] oxyCODONE-acetaminophen (PERCOCET) 10-325 mg per tablet Take 1 tablet by mouth every six (6) hours as needed for pain. Max 4 tabs/day. Fill on or after: 04/07/22. Brand name only due to allergy. (Patient not taking: Reported on 03/22/2022) 120 tablet 0    pantoprazole (PROTONIX) 40 MG tablet Take 1 tablet (40 mg total) by mouth daily throat closing, etc) for up to 1 dose. 1 each 12    estradioL (ESTRACE) 0.5 MG tablet Take 1 tablet (0.5 mg total) by mouth daily. 30 tablet 1    gabapentin (NEURONTIN) 800 MG tablet Take 1 tablet (800 mg total) by mouth two (2) times a day. 180 tablet 0    hydroCHLOROthiazide (HYDRODIURIL) 12.5 MG tablet TAKE 1 TABLET(12.5 MG) BY MOUTH DAILY AS NEEDED FOR SWELLING 30 tablet 5    methylPREDNISolone (MEDROL DOSEPACK) 4  mg tablet follow package directions 1 each 0    montelukast (SINGULAIR) 10 mg tablet Take 1 tablet (10 mg total) by mouth nightly. 30 tablet 2    naloxone (NARCAN) 4 mg nasal spray One spray in either nostril once for known/suspected opioid overdose. May repeat every 2-3 minutes in alternating nostril til EMS arrives 1 each 0    nitrofurantoin, macrocrystal-monohydrate, (MACROBID) 100 MG capsule Take 1 capsule (100 mg total) by mouth two (2) times a day for 5 days. 10 capsule 0    nortriptyline (PAMELOR) 25 MG capsule Take 1 capsule (25 mg total) by mouth nightly. 90 capsule 1    olopatadine (PATANOL) 0.1 % ophthalmic solution Administer 1 drop to both eyes Two (2) times a day. 5 mL 1    omalizumab (XOLAIR) 150 mg/mL syringe Inject the contents of 2 syringes (300 mg total) under the skin every twenty-eight (28) days. 2 mL 11    oxyCODONE-acetaminophen (PERCOCET) 10-325 mg per tablet Take 1 tablet by mouth every six (6) hours as needed for pain. Max 4 tabs/day. Fill on or after: 02/06/22. Brand name only due to allergy. 120 tablet 0    pantoprazole (PROTONIX) 40 MG tablet Take 1 tablet (40 mg total) by mouth daily as needed (take it 30 mins prior to breakfast). 30 tablet 3    potassium chloride 20 MEQ CR tablet Take 1 tablet (20 mEq total) by mouth daily. 90 tablet 2    PROCHAMBER Spcr       rizatriptan (MAXALT-MLT) 10 MG disintegrating tablet Take 1 tablet by mouth at ONSET of migraine or aura. May repeat in 2 HOURS if needed. DO NOT EXCEED 2 doses/day, 2 days/week, 8 doses/month. 10 tablet 5 rosuvastatin (CRESTOR) 10 MG tablet TAKE 1 TABLET(10 MG) BY MOUTH EVERY NIGHT 90 tablet 0    tamsulosin (FLOMAX) 0.4 mg capsule Take 1 capsule (0.4 mg total) by mouth daily for 10 days. 10 capsule 0    triamcinolone (KENALOG) 0.1 % cream Apply 1 application topically two (2) times a day as needed. WHEN ALLERGIES FLARE-UP AND CAUSES RASH / ITCHING 80 g 0    XHANCE 93 mcg/actuation AerB 1 spray into each nostril two (2) times a day as needed. 16 mL 11     Current Facility-Administered Medications   Medication Dose Route Frequency Provider Last Rate Last Admin    omalizumab Geoffry Paradise) injection 300 mg  300 mg Subcutaneous Q28 Days Volertas, Sofija Dalia, MD   300 mg at 03/09/22 1441       ROS:     Review of Systems    Vital Signs:     Wt Readings from Last 3 Encounters:   03/26/22 76.2 kg (168 lb)   03/25/22 76.7 kg (169 lb 3.2 oz)   03/22/22 75.8 kg (167 lb)     Temp Readings from Last 3 Encounters:   03/25/22 36.7 ??C (98 ??F) (Temporal)   03/22/22 36.7 ??C (98 ??F)   03/11/22 36.2 ??C (97.2 ??F) (Skin)     BP Readings from Last 3 Encounters:   03/26/22 115/71   03/25/22 114/70   03/22/22 122/80     Pulse Readings from Last 3 Encounters:   03/25/22 132   03/22/22 87   03/11/22 92     Body mass index is 31.97 kg/m??.    Objective:      Physical Exam  Vitals and nursing note reviewed.   Constitutional:       General: She is not in acute  distress.     Appearance: Normal appearance. She is not ill-appearing.   Eyes:      General: No scleral icterus.  Cardiovascular:      Rate and Rhythm: Normal rate and regular rhythm.      Heart sounds: Normal heart sounds.   Pulmonary:      Effort: No respiratory distress.      Breath sounds: Normal breath sounds. No stridor. No wheezing.   Abdominal:      Palpations: Abdomen is soft.      Tenderness: There is abdominal tenderness (suprapubic region.). There is no right CVA tenderness, left CVA tenderness, guarding or rebound.   Musculoskeletal:         General: No tenderness (no spinal tenderness. SLR is negative bilaterally. Intact strength in bilateral lower legs.).      Right lower leg: No edema.      Left lower leg: No edema.   Neurological:      Mental Status: She is alert.   Psychiatric:         Mood and Affect: Mood normal.         Behavior: Behavior normal.         Thought Content: Thought content normal.         Judgment: Judgment normal.           Labs:     No results found for this visit on 03/25/22.    Royal Hawthorn, MD  03/29/2022

## 2022-03-25 NOTE — Unmapped (Unsigned)
Left knee severe medial compartment osteoarthritis, Right knee osteoarthritis, Gelsyn injection #1/3 (Dr. Zenda Alpers)     We discussed the Gelsyn injection series for the bilateral knee and the patient desired to proceed.    Procedure: After sterile prep, 2ml Gelsyn was injected into the right knee. The patient tolerated the procedure well.    Procedure: After sterile prep, 2ml Gelsyn was injected into the left knee. The patient tolerated the procedure well.    Follow-up in 1 week for her second bilateral knee injections.

## 2022-03-25 NOTE — Unmapped (Unsigned)
FYI      Called patient to get more information. No answer left message.       Called and spoke with patient advised that her medication we prescribe is used to treat her chronic pain. She just left from seeing her PCP and they told her that she would need to speak with [place] about medication since we treat her for pain. Advised that she can have them reach out to [place] if they had any questions. Advised that on pain agreement it says  Only get opioid prescriptions from the provider in this agreement; not from any other provider without talking with and getting permission from the provider of this Agreement, unless I am in the hospital or have an emergency and contact the provider of this Agreement within 48 hours of being discharged from the hospital. Still sending to provider for advice and FYI    She will have them reach out to [place] with questions regarding medication.

## 2022-03-25 NOTE — Unmapped (Signed)
Error

## 2022-03-26 ENCOUNTER — Institutional Professional Consult (permissible substitution): Admit: 2022-03-26 | Discharge: 2022-03-27 | Payer: MEDICARE

## 2022-03-26 DIAGNOSIS — M170 Bilateral primary osteoarthritis of knee: Principal | ICD-10-CM

## 2022-03-26 MED ADMIN — sodium hyaluronate (viscosup) (GELSYN-3) 16.8 mg/2 mL injection 16.8 mg: 16.8 mg | INTRA_ARTICULAR | @ 13:00:00 | Stop: 2022-03-26

## 2022-03-26 NOTE — Unmapped (Signed)
Patient went to the ER yesterday because she was still in pain. They did an ultrasound and said they could not find anything and to follow up with PCP. She has an appointment on 03/29/22 with Neeligiri, however, she states she is still in a lot of pain and needs some advice.

## 2022-03-26 NOTE — Unmapped (Signed)
Pre-procedure call made and RN spoke with patient.    WENT TO ED LAST NIGHT FOR POSSIBLE KIDNEY STONE/INFECTION.  PICKING UP ABX NOW; WAITING ON CALL BACK FROM PCP TO GET INSTRUCTION WHETHER OR NOT TO START MEDS.  SENT MESSAGE TO MD.  Denies being diabetic.  Denies taking any recent blood thinners/NSAIDs.   Denies any electronic implants  Denies pregnancy    Driver necessary    Patient informed to arrive 30 minutes before procedure appointment time.  Patient verbalized understanding to all.

## 2022-03-26 NOTE — Unmapped (Signed)
Spoke with the patient and she stated she called pain management and they told her they only do pain management for chronic pain for kidney stones she needs to see her primary doctor or go to the ER for pain medication. Informed her that Dr. Henreitta Leber ordered her medrol pack a steroid. She stated this doesn't help with the pain. Her pain management clinic told her that her doctor can order her something. Informed her that Dr. Henreitta Leber did not order any pain medication at her visit he is not going to order it now. Informed patient I will send this to Dr. Henreitta Leber to review and we will get back with her after he reviews this.

## 2022-03-27 DIAGNOSIS — E7800 Pure hypercholesterolemia, unspecified: Principal | ICD-10-CM

## 2022-03-27 MED ORDER — ROSUVASTATIN 10 MG TABLET
ORAL_TABLET | 0 refills | 0 days
Start: 2022-03-27 — End: ?

## 2022-03-27 NOTE — Unmapped (Signed)
She went to the ED later on 03/25/22, and was evaluated with CT scan, and labs. BV diagnosed, and she was started on antibiotics.

## 2022-03-29 ENCOUNTER — Ambulatory Visit: Admit: 2022-03-29 | Discharge: 2022-03-30 | Payer: MEDICARE

## 2022-03-29 ENCOUNTER — Telehealth: Admit: 2022-03-29 | Discharge: 2022-03-30 | Payer: MEDICARE

## 2022-03-29 DIAGNOSIS — R109 Unspecified abdominal pain: Principal | ICD-10-CM

## 2022-03-29 DIAGNOSIS — M5416 Radiculopathy, lumbar region: Principal | ICD-10-CM

## 2022-03-29 DIAGNOSIS — R3129 Other microscopic hematuria: Principal | ICD-10-CM

## 2022-03-29 MED ORDER — ROSUVASTATIN 10 MG TABLET
ORAL_TABLET | 0 refills | 0 days | Status: CP
Start: 2022-03-29 — End: ?

## 2022-03-29 MED ORDER — EMPTY CONTAINER
2 refills | 0 days
Start: 2022-03-29 — End: ?

## 2022-03-29 NOTE — Unmapped (Signed)
Saw where patient was ordered abx for BV - OK per Dr Oda Kilts that she come in tomorrow for her injections.  Called and lmsg on patient's voicemail.

## 2022-03-29 NOTE — Unmapped (Signed)
Unable to complete refill request, as patient is due for labs.. Patient is due for a lipid panel per protocol   Please review below request.       Patient is requesting the following refill  Requested Prescriptions     Pending Prescriptions Disp Refills    rosuvastatin (CRESTOR) 10 MG tablet [Pharmacy Med Name: ROSUVASTATIN 10MG  TABLETS] 90 tablet 0     Sig: TAKE 1 TABLET(10 MG) BY MOUTH EVERY NIGHT       Recent Visits  Date Type Provider Dept   03/25/22 Office Visit Neelagiri, Ardyth Man, MD [facility]   03/22/22 Office Visit Leana Gamer, [place] [facility]   02/24/22 Telemedicine Neelagiri, Ardyth Man, MD [facility]   01/20/22 Office Visit Neelagiri, Ardyth Man, MD [facility]   01/07/22 Telemedicine Leana Gamer, [place] [facility]   11/12/21 Telemedicine Neelagiri, Ardyth Man, MD [facility]   10/20/21 Office Visit Neelagiri, Ardyth Man, MD [facility]   08/13/21 Office Visit Leana Gamer, [place] [facility]   07/23/21 Office Visit Neelagiri, Ardyth Man, MD [facility]   06/08/21 Office Visit Neelagiri, Ardyth Man, MD [facility]   Showing recent visits within past 365 days with a meds authorizing provider and meeting all other requirements  Today's Visits  Date Type Provider Dept   03/29/22 Appointment Neelagiri, Ardyth Man, MD [facility]   Showing today's visits with a meds authorizing provider and meeting all other requirements  Future Appointments  No visits were found meeting these conditions.  Showing future appointments within next 365 days with a meds authorizing provider and meeting all other requirements       Labs: Cholesterol:   Cholesterol (mg/dL)   Date Value 16/10/9602 134     Cholesterol, Total (mg/dL)   Date Value   54/09/8117 238 (H)   ,   Triglycerides (mg/dL)   Date Value   14/78/2956 159 (H)   01/02/2015 150   ,   HDL (mg/dL)   Date Value   21/30/8657 45   01/02/2015 60   ,   LDL calculated (mg/dl)   Date Value   84/69/6295 149 (H)     LDL Calculated (mg/dL)   Date Value   28/41/3244 57

## 2022-03-29 NOTE — Unmapped (Signed)
Patient had an appointment with PCP today

## 2022-03-29 NOTE — Unmapped (Signed)
[facility] in [place].

## 2022-03-30 ENCOUNTER — Ambulatory Visit: Admit: 2022-03-30 | Discharge: 2022-03-31 | Payer: MEDICARE | Attending: Anesthesiology | Primary: Anesthesiology

## 2022-03-30 DIAGNOSIS — M7918 Myalgia, other site: Principal | ICD-10-CM

## 2022-03-30 DIAGNOSIS — M47812 Spondylosis without myelopathy or radiculopathy, cervical region: Principal | ICD-10-CM

## 2022-03-30 MED ADMIN — lidocaine (XYLOCAINE) 5 mg/mL (0.5 %) injection 10 mL: 10 mL | @ 14:00:00 | Stop: 2022-03-30

## 2022-03-30 MED ADMIN — lidocaine (XYLOCAINE) 20 mg/mL (2 %) injection 3 mL: 3 mL | @ 14:00:00 | Stop: 2022-03-30

## 2022-03-30 NOTE — Unmapped (Signed)
Name:  Natalie [name]  DOB: 1970-09-27  Today's Date: 03/29/2022  Age:  52 y.o.    Assessment/Plan:      [name] was seen today for follow-up.    Diagnoses and all orders for this visit:    Microscopic hematuria  -     Ambulatory referral to Urology; Future    Left lumbar radiculopathy    Left flank pain    She is advised to be referred to urology for further evaluation of persistent microscopic hematuria.  Maintain adequate water intake.    Continue the course, and complete the course of tapering prednisone therapy.  She is advised to get x-ray of lumbar spine, to assess for lumbar disc disease, as she was having left-sided lumbar radiculopathy symptoms.  Will consider physical therapy.      Diagnosis and plan along with any newly prescribed medication(s) were discussed in detail with this patient today. The patient verbalized understanding and agreed with the plan without language barriers or behavioral barriers to understanding unless otherwise noted.    Subjective:      HPI: Natalie [name] is a 51 y.o. female is here for    Chief Complaint   Patient presents with    Follow-up     Was told she did not have kidney stones at her last ED visit.   After the ED visit she was left confused.      On 03/22/2022, on a walk-in visit with Ms. [name], she was found to have indeterminate 4 mm calcification in her right-hemipelvis, which could be distal ureter calculus, phlebolith or material within the fecal stream.  She was thought to have kidney stone, and was prescribed tamsulosin.    She saw me virtually on 03/25/2022, with complaints of persisting left-sided abdominal pain, and numbness in left leg.  On 03/25/2022, I have ordered a CT scan of abdomen for persisting left-sided abdominal pain, and sent the urine for urinalysis.  I have started her on tapering dose of prednisone, for lumbar radiculopathy causing left-sided leg numbness.  Urinalysis was abnormal, and so urine culture added, and she was treated empirically with nitrofurantoin.    Later that evening, as pain was bothering her a lot, she went to the emergency department at [facility].  Per CT scan of abdomen, No acute abnormality identified in the abdomen or pelvis. 1.2 cm hyperdensity in the transverse colon could reflect ingested material.  At the ED, urinalysis did not reveal any findings suggestive of UTI.  Pelvic swabs revealed that she had bacterial vaginosis.  So, metronidazole was called in.    Apparently, she has completed nitrofurantoin twice daily for 3 days, and discontinued, as she found that she did not have urinary infection.  She has been taking metronidazole, for bacterial vaginosis.  Apparently, left-sided abdominal pain has much improved, and is down to 3/10 in intensity.  The numbness in left leg has much improved, and only occasional now.  She has been taking prednisone treatment, which was prescribed by me on 03/25/2022.    She has been found to have blood and red blood cells on repeated urine testing over the last few months.       Past Medical/Surgical History:     Past Medical History:   Diagnosis Date    Allergic conjunctivitis 11/23/2018    Anemia 1986    Arthritis     Breast mass     Chondromalacia of left knee     Chondromalacia of right knee  Depressive disorder     Fibrocystic breast     GERD (gastroesophageal reflux disease)     Hematuria     History of kidney stones     History of transfusion     Hyperlipidemia 1999    Hypothyroidism     Lumbar disc disease 1998    She was injured at work at the age of 47 and then had disk surgery 2 years later     Menopause ovarian failure     Menopause, premature     Migraine with aura     Neuromuscular disorder (CMS-HCC) 1999    Obesity (BMI 30-39.9) 11/23/2018    Ovarian cyst     Plantar fasciitis     Pseudotumor cerebri     Rash due to allergy 11/23/2018    Sickle cell trait (CMS-HCC)     Urinary tract infection     Vaginitis      Past Surgical History:   Procedure Laterality Date BACK SURGERY  2001    BREAST BIOPSY Bilateral     When patient was 16 & 17 Both Negative    HYSTERECTOMY  2008    PR REMOVAL OF TONSILS,13+ Y/O Bilateral 10/10/2019    Procedure: TONSILLECTOMY;  Surgeon: Lona Millard, MD;  Location: [facility];  Service: ENT    SALPINGOOPHORECTOMY Bilateral 2007    SPINE SURGERY  2001    TOTAL VAGINAL HYSTERECTOMY  01/04/2006    Uterine Prolapse-Dr. Leeanne Rio OP Note    TUBAL LIGATION  1995       Family History:     Family History   Problem Relation Age of Onset    Heart attack Father     Mental illness Father     Asthma Father     Hypertension Mother     Heart disease Mother     Arthritis Mother     Depression Mother     Drug abuse Mother     Mental illness Mother     Ulcers Mother     COPD Mother     Heart attack Maternal Grandmother     Heart disease Maternal Grandmother     Hypertension Maternal Grandmother     Thyroid disease Maternal Grandmother     Breast cancer Cousin 44    Cancer Sister         lymphoma    COPD Maternal Aunt     Depression Son     Drug abuse Son     Mental illness Son     Stroke Paternal Grandfather     Thyroid disease Other     Alcohol abuse Neg Hx        Social History:     Social History     Socioeconomic History    Marital status: Single     Spouse name: None    Number of children: 2    Years of education: 15    Highest education level: None   Occupational History     Employer: NOT EMPLOYED   Tobacco Use    Smoking status: Never     Passive exposure: Past    Smokeless tobacco: Never   Vaping Use    Vaping status: Never Used   Substance and Sexual Activity    Alcohol use: Never    Drug use: Never    Sexual activity: Yes     Partners: Male     Birth control/protection: Post-menopausal, Surgical     Comment: steady partner for 4  yrs, as on 04/03/19.   Social History Narrative    Marital Status - Single     Children - Son(s) [2]    Pets - Dog (1) Turtle (1)     Household - Lives with sons and grandson Ephriam Knuckles)     Occupation - Disabled since 2002    Tobacco/Alcohol/Drug Use - Denies      Diet - Regular    Exercise/Sports - Walking     Hobbies - Movies     Education - [employer] (Engineer, agricultural)     Country of Origin - [place]                      Social Determinants of Health     Financial Resource Strain: Low Risk  (03/22/2022)    Overall Financial Resource Strain (CARDIA)     Difficulty of Paying Living Expenses: Not hard at all   Food Insecurity: No Food Insecurity (03/22/2022)    Hunger Vital Sign     Worried About Running Out of Food in the Last Year: Never true     Ran Out of Food in the Last Year: Never true   Transportation Needs: No Transportation Needs (03/22/2022)    PRAPARE - Therapist, art (Medical): No     Lack of Transportation (Non-Medical): No       Allergies:     Buprenorphine hcl, Pregabalin, Adhesive tape-silicones, Doxycycline hyclate (bulk), Keflex [cephalexin], Opioids - morphine analogues, and Oxycodone-acetaminophen    Current Medications:     Current Outpatient Medications   Medication Sig Dispense Refill    albuterol HFA 90 mcg/actuation inhaler Inhale 2 puffs every four (4) hours as needed for wheezing. 25.5 g 0    azelastine (ASTELIN) 137 mcg (0.1 %) nasal spray 2 sprays into each nostril Two (2) times a day. 30 mL 12    azelastine (OPTIVAR) 0.05 % ophthalmic solution Administer 2 drops to both eyes daily as needed. 6 mL 5    baclofen (LIORESAL) 20 MG tablet 10 - 20 mg three times a day as needed for spasms. 90 tablet 2    cetirizine (ZYRTEC) 10 MG chewable tablet Chew 1 tablet (10 mg total) at bedtime as needed for allergies.      diclofenac sodium (PENNSAID) 20 mg/gram /actuation(2 %) sopm Apply 2 pumps to affected area up to four times a day. 112 g 2    empty container Misc Use as directed to dispose of Xolair syringes 1 each 2    EPINEPHrine (EPIPEN) 0.3 mg/0.3 mL injection Inject 0.3 mL (0.3 mg total) into the muscle once as needed for anaphylaxis (Difficulty breathing, throat closing, etc) for up to 1 dose. 1 each 12    estradioL (ESTRACE) 0.5 MG tablet Take 1 tablet (0.5 mg total) by mouth daily. 30 tablet 1    famotidine (PEPCID) 20 MG tablet Take 1 tablet (20 mg total) by mouth two (2) times a day.      gabapentin (NEURONTIN) 800 MG tablet Take 1 tablet (800 mg total) by mouth two (2) times a day. 180 tablet 0    hydroCHLOROthiazide (HYDRODIURIL) 12.5 MG tablet TAKE 1 TABLET(12.5 MG) BY MOUTH DAILY AS NEEDED FOR SWELLING 30 tablet 5    methylPREDNISolone (MEDROL DOSEPACK) 4 mg tablet follow package directions 1 each 0    metroNIDAZOLE (FLAGYL) 500 MG tablet Take 1 tablet (500 mg total) by mouth two (2) times a day.  montelukast (SINGULAIR) 10 mg tablet Take 1 tablet (10 mg total) by mouth nightly. 30 tablet 2    naloxone (NARCAN) 4 mg nasal spray One spray in either nostril once for known/suspected opioid overdose. May repeat every 2-3 minutes in alternating nostril til EMS arrives 1 each 0    nitrofurantoin, macrocrystal-monohydrate, (MACROBID) 100 MG capsule Take 1 capsule (100 mg total) by mouth two (2) times a day for 5 days. 10 capsule 0    nortriptyline (PAMELOR) 25 MG capsule Take 1 capsule (25 mg total) by mouth nightly. 90 capsule 1    olopatadine (PATANOL) 0.1 % ophthalmic solution Administer 1 drop to both eyes Two (2) times a day. 5 mL 1    omalizumab (XOLAIR) 150 mg/mL syringe Inject the contents of 2 syringes (300 mg total) under the skin every twenty-eight (28) days. 2 mL 11    oxyCODONE-acetaminophen (PERCOCET) 10-325 mg per tablet Take 1 tablet by mouth every six (6) hours as needed for pain. Max 4 tabs/day. Fill on or after: 02/06/22. Brand name only due to allergy. 120 tablet 0    pantoprazole (PROTONIX) 40 MG tablet Take 1 tablet (40 mg total) by mouth daily as needed (take it 30 mins prior to breakfast). 30 tablet 3    potassium chloride 20 MEQ CR tablet Take 1 tablet (20 mEq total) by mouth daily. 90 tablet 2    PROCHAMBER Spcr       rizatriptan (MAXALT-MLT) 10 MG disintegrating tablet Take 1 tablet by mouth at ONSET of migraine or aura. May repeat in 2 HOURS if needed. DO NOT EXCEED 2 doses/day, 2 days/week, 8 doses/month. 10 tablet 5    rosuvastatin (CRESTOR) 10 MG tablet TAKE 1 TABLET(10 MG) BY MOUTH EVERY NIGHT 90 tablet 0    tamsulosin (FLOMAX) 0.4 mg capsule Take 1 capsule (0.4 mg total) by mouth daily for 10 days. 10 capsule 0    triamcinolone (KENALOG) 0.1 % cream Apply 1 application topically two (2) times a day as needed. WHEN ALLERGIES FLARE-UP AND CAUSES RASH / ITCHING 80 g 0    XHANCE 93 mcg/actuation AerB 1 spray into each nostril two (2) times a day as needed. 16 mL 11    EPINEPHrine (EPIPEN) 0.3 mg/0.3 mL injection Inject 0.3 mL (0.3 mg total) into the muscle once for 1 dose. 2 each 0     Current Facility-Administered Medications   Medication Dose Route Frequency Provider Last Rate Last Admin    omalizumab Geoffry Paradise) injection 300 mg  300 mg Subcutaneous Q28 Days Volertas, Sofija Dalia, MD   300 mg at 03/09/22 1441       ROS:     Review of Systems    Vital Signs:     Wt Readings from Last 3 Encounters:   03/26/22 76.2 kg (168 lb)   03/25/22 76.7 kg (169 lb 3.2 oz)   03/22/22 75.8 kg (167 lb)     Temp Readings from Last 3 Encounters:   03/25/22 36.7 ??C (98 ??F) (Temporal)   03/22/22 36.7 ??C (98 ??F)   03/11/22 36.2 ??C (97.2 ??F) (Skin)     BP Readings from Last 3 Encounters:   03/26/22 115/71   03/25/22 114/70   03/22/22 122/80     Pulse Readings from Last 3 Encounters:   03/25/22 132   03/22/22 87   03/11/22 92     There is no height or weight on file to calculate BMI.    Objective:      Physical  Exam  Nursing note reviewed.      Patient couldn't be examined, as this was a phone visit.       Labs:     No results found for this visit on 03/29/22.    Royal Hawthorn, MD  03/29/2022        The patient reports they are physically located in [place] and is currently: at home. I conducted a phone visit.  I spent 12 minutes on the phone call with the patient on the date of service .

## 2022-03-30 NOTE — Unmapped (Signed)
AVS reviewed with patient and she verbalized understanding to all.  Has driver.

## 2022-03-30 NOTE — Unmapped (Addendum)
PROCEDURE; Bilateral C4, C5, C6 Cervical Medial Branch Nerve Blocks #2    PRE-PROCEDURE DIAGNOSIS: Cervical facet artropathy  POST-PROCEDURE DIAGNOSIS: Same    PERFORMED BY: Dr. Criss Rosales  ASSISTANT: Dr. Rubin Payor  Anesthesia: Local     INTERIM HISTORY:   The patient is a 52 y.o. female with past medical history of pseudotumor cerebri, bilateral knee osteoarthritis, myofascial pain syndrome, L5-S1 spinal fusion in 2001, coccydynia. She has axial neck pain presents for repeat cervical MBBs.     Of note, she recently had an ED visit for kidney stones and finished a course of antibiotics. A review of her medication history did not indicate she was taking topiramate or any obvious medications that are carbonic anhydrase inhibitors.     Cervical spine XR:  Impression Mild multilevel degenerative disc disease, greatest at C4-C5 and C5-6 and unchanged from the prior CT on 08/08/2020. Anterolisthesis of C4 on C5 is also unchanged.    Results from MBB#1: 85% relief for 2 days, then pain returned to baseline  Results from MBB#2: 80%  relief for 24 hours, then pain returned to baseline     DESCRIPTION OF THE PROCEDURE:     Informed consent was obtained and potential risks discussed including, but not limited to: bleeding, bruising, severe allergic reaction to components of the injection materials, compression of the spinal cord, infection (superficial, deep, abscess and meningitis), nerve or spinal cord damage, paralysis, inability to place the needle properly, arachnoiditis, the possibility of no benefit (pain relief) derived from the injection, or in rare occasions worsening of pain. Questions were answered to the patient's satisfaction and the patient wishes to proceed. Alternative options for treatment have previously been discussed and explored with the patient. The patient was marked on the appropriate side.    The patient is not taking antiplatelet or anticoagulation medications and does  have a driver today.    Procedure Details:    After informed consent was obtained the patient was placed in prone position on the fluoroscopy table and all pressure points padded. Standard ASA monitors were applied. A timeout protocol was performed. Hand washing with antibacterial soap and water and/or the use of alcohol based cleanser was performed. Proper protective gear was worn by the physician including a mask, scrub cap, and sterile gloves.    The patient's neck and the area lateral to the mastoid processes was prepped with chloroprep and draped in a sterile fashion. Skin and subcutaneous tissue overlying the intended target areas were injected with 0.5% of lidocaine.    Under antero/posterior  fluoroscopic guidance, the intended C4, C5, C6 vertebra were identified. Then 22 gauge 3.5 inch needles were advanced in antero/posterior fluoroscopy view towards the articular pillars of  C6 spinal levels. Then under a lateral flouroscopic view, the needles were advanced to the articular pillars of the C 4/5/6 spinal levels. Once the center of each pillar was reached, after negative aspiration for CSF/heme, , then 0.5 ml of 2% lidocaine was injected through each needle and the needles were removed without difficulty, the patient cleaned off, and bandages applied.     The procedure was repeated on the contralateral side.    The total volume of injectate was  3 mL .    The patient did tolerate the procedure extremely well and there was no apparent complications. All injection sites were sterilely dressed. A neurological assessment 15 minutes following the procedure was unchanged. The patient was discharged after an appropriate period of observation and post procedural education  was given.      Total Fluoroscopic time  69  seconds.  Pre-procedure pain score was 7 out of 10  Post-procedure pain score is 7 out of 10    DISPOSITION:   Follow-up with procedure results and schedule RFA

## 2022-03-30 NOTE — Unmapped (Signed)
POST PROCEDURE INSTRUCTIONS   You may apply an ice pack 20-30 minutes at a time to the injection site if you experience soreness.     Keep the injection site clean and dry. You make remove the band-aid one day following the procedure.     You may take a shower but AVOID getting in to baths, pools or whirlpools for 48 HOURS AFTER THE PROCEDURE.       ACTIVITY   Refrain from heavy activity for the next 24 to 48 hours. General walking is okay. You may resume your normal activities the day following the procedure.   You may start or resume your individualized exercise program or physical therapy 48 hours after the procedure.  IF YOU'VE HAD TRIGGER POINT INJECTIONS, YOU MAY CONTINUE EXERCISE OR PHYSICAL THERAPY WITHOUT DELAY.  MEDICATIONS   Please note that it is okay to continue other prescribed medications (blood pressure, insulin, water pill, depression/anxiety pill, etc.) as well as other prescribed pain medications such as Neurontin, Lyrical, Celebrex, Ultram, Vicodin, Norco and acetaminophen (Tylenol).   SIDE EFFECTS   Increase in pain during the first 24 to 48 hours.     You might experience:   1. Mild to moderate swelling at the joint.   2. Possible bruising at the injection site.     WHEN TO CALL THE DOCTOR/NURSE   Severe pain, worse or different that the pain you had before the procedure.     Fever or chills.     Redness, or swelling around the injection site.     Call the Pain Management  Procedural nurses [phone],  during normal business hours (7 am-3 pm). If it is AFTER HOURS or during a weekend or holiday, call the hospital operator and ask for the Anesthesia Pain physician on call at [phone].   FOR EMERGENCIES, CALL 911 OR GO TO THE NEAREST HOSPITAL EMERGENCY DEPARTMENT.   ?   TO SCHEDULE APPOINTMENTS OR FOR QUESTIONS RELATED TO MEDICATIONS   Call the Pain Management Clinic at [phone]

## 2022-03-31 ENCOUNTER — Ambulatory Visit: Admit: 2022-03-31 | Payer: MEDICARE | Attending: Psychologist | Primary: Psychologist

## 2022-03-31 NOTE — Unmapped (Signed)
Refill request received for patient.      Medication Requested: Diclofenac   Last Office Visit: 01/11/2022   Next Office Visit: 04/08/2022  Last Prescriber: Arlester Marker     Please refill if appropriate          Chart states patient is no longer taking.

## 2022-03-31 NOTE — Unmapped (Signed)
Hello,    Per the discussion after your procedure, please answer the below questions dealing with pain relief post procedure.     Procedure date:  03/30/22      Procedure : Cervical Medial Branch Block #2     Percentage of pain relief following the procedure :  80     How long did the relief last :  24 hours        PEG Scale Assessing Pain Intensity and Interference (Pain, Enjoyment, General Activity)    What number best describes your pain on average in the past week? ( No Pain )                                    (as bad as you can imagine) :                                                                                                                        0           1  2  3  4  5  6  7  8  9         10                                                                                                                                       Answer 8  What number best describes how, during the past week, pain has interfered with your enjoyment of life? (Does not interfere)                                              (Completely interfere) :  0           1  2  3  4  5  6  7  8  9          10                                                                                                                                      Answer 8    3. What number best describes how, during the past week, pain has interfered with your general activity? (Does not interfere)                                 (Completely interfere ):                                                                                                                                                                                        0             1  2  3  4  5  6  7  8  9               10  Answer 5      Requesting further procedures :  Next appointment    Thank you. Look forward from hearing from you.

## 2022-04-01 ENCOUNTER — Emergency Department: Admit: 2022-04-01 | Discharge: 2022-04-01 | Disposition: A | Discharge status: Home / Self Care | Payer: MEDICARE

## 2022-04-01 ENCOUNTER — Ambulatory Visit: Admit: 2022-04-01 | Discharge: 2022-04-01 | Disposition: A | Discharge status: Home / Self Care | Payer: MEDICARE

## 2022-04-01 LAB — CBC W/ AUTO DIFF
BASOPHILS ABSOLUTE COUNT: 0.1 10*9/L (ref 0.0–0.1)
BASOPHILS RELATIVE PERCENT: 1.2 %
EOSINOPHILS ABSOLUTE COUNT: 0.3 10*9/L (ref 0.0–0.5)
EOSINOPHILS RELATIVE PERCENT: 3.1 %
HEMATOCRIT: 42 % (ref 34.0–44.0)
HEMOGLOBIN: 13.5 g/dL (ref 11.3–14.9)
LYMPHOCYTES ABSOLUTE COUNT: 3.6 10*9/L (ref 1.1–3.6)
LYMPHOCYTES RELATIVE PERCENT: 37.5 %
MEAN CORPUSCULAR HEMOGLOBIN CONC: 32.3 g/dL (ref 32.0–36.0)
MEAN CORPUSCULAR HEMOGLOBIN: 30.2 pg (ref 25.9–32.4)
MEAN CORPUSCULAR VOLUME: 93.6 fL (ref 77.6–95.7)
MEAN PLATELET VOLUME: 9.7 fL (ref 6.8–10.7)
MONOCYTES ABSOLUTE COUNT: 0.9 10*9/L — ABNORMAL HIGH (ref 0.3–0.8)
MONOCYTES RELATIVE PERCENT: 9.7 %
NEUTROPHILS ABSOLUTE COUNT: 4.7 10*9/L (ref 1.8–7.8)
NEUTROPHILS RELATIVE PERCENT: 48.5 %
PLATELET COUNT: 255 10*9/L (ref 150–450)
RED BLOOD CELL COUNT: 4.48 10*12/L (ref 3.95–5.13)
RED CELL DISTRIBUTION WIDTH: 13.7 % (ref 12.2–15.2)
WBC ADJUSTED: 9.7 10*9/L (ref 3.6–11.2)

## 2022-04-01 LAB — URINALYSIS WITH MICROSCOPY
BACTERIA: NONE SEEN /HPF
BILIRUBIN UA: NEGATIVE
GLUCOSE UA: NEGATIVE
HYPHAL YEAST: NONE SEEN /HPF
KETONES UA: NEGATIVE
NITRITE UA: NEGATIVE
PH UA: 6 (ref 5.0–8.0)
PROTEIN UA: NEGATIVE
RBC UA: 23 /HPF — ABNORMAL HIGH (ref 0–3)
SPECIFIC GRAVITY UA: 1.016 (ref 1.005–1.030)
SQUAMOUS EPITHELIAL: <1 /HPF (ref 0–5)
UROBILINOGEN UA: 0.2
WBC CLUMPS: NONE SEEN /HPF
WBC UA: 2 /HPF (ref 0–2)
YEAST: NONE SEEN /HPF

## 2022-04-01 LAB — COMPREHENSIVE METABOLIC PANEL
ALBUMIN: 4.1 g/dL (ref 3.4–5.0)
ALKALINE PHOSPHATASE: 44 U/L — ABNORMAL LOW (ref 46–116)
ALT (SGPT): 32 U/L (ref 10–49)
ANION GAP: 5 mmol/L (ref 3–11)
AST (SGOT): 41 U/L — ABNORMAL HIGH (ref <34)
BILIRUBIN TOTAL: 0.3 mg/dL (ref 0.3–1.2)
BLOOD UREA NITROGEN: 9 mg/dL (ref 9–23)
BUN / CREAT RATIO: 13
CALCIUM: 9.7 mg/dL (ref 8.7–10.4)
CHLORIDE: 103 mmol/L (ref 98–107)
CO2: 33 mmol/L — ABNORMAL HIGH (ref 20.0–31.0)
CREATININE: 0.67 mg/dL (ref 0.55–1.02)
EGFR CKD-EPI (2021) FEMALE: >90 mL/min/{1.73_m2} (ref >=60)
GLUCOSE RANDOM: 106 mg/dL (ref 70–179)
POTASSIUM: 3.5 mmol/L (ref 3.5–5.1)
PROTEIN TOTAL: 7.5 g/dL (ref 5.7–8.2)
SODIUM: 141 mmol/L (ref 136–145)

## 2022-04-01 LAB — LIPASE: LIPASE: 31 U/L (ref 12–53)

## 2022-04-01 MED ORDER — DICYCLOMINE 20 MG TABLET
ORAL_TABLET | Freq: Two times a day (BID) | ORAL | 0 refills | 10 days | Status: CP
Start: 2022-04-01 — End: 2022-04-11

## 2022-04-01 MED ADMIN — ketorolac (TORADOL) injection 15 mg: 15 mg | INTRAVENOUS | @ 06:00:00 | Stop: 2022-04-01

## 2022-04-01 MED ADMIN — fentaNYL (PF) (SUBLIMAZE) injection 50 mcg: 50 ug | INTRAVENOUS | @ 06:00:00 | Stop: 2022-04-01

## 2022-04-01 MED ADMIN — iohexol (OMNIPAQUE) 350 mg iodine/mL solution 100 mL: 100 mL | INTRAVENOUS | @ 07:00:00 | Stop: 2022-04-01

## 2022-04-01 MED FILL — EMPTY CONTAINER: 120 days supply | Qty: 1 | Fill #0

## 2022-04-01 MED FILL — XOLAIR 150 MG/ML SUBCUTANEOUS SYRINGE: SUBCUTANEOUS | 28 days supply | Qty: 2 | Fill #1

## 2022-04-01 NOTE — Unmapped (Signed)
Pt was seen at [facility] and told she had a kidney stone in her right kidney, and passed that this week. Pt reports that she went to [facility] for left flank pain, but had a stone in the right kidney.  Pt states that her pain has now returned.  No visible blood in  urine or trouble urinating.

## 2022-04-01 NOTE — Unmapped (Signed)
Discharge instructions reviewed with pt whom endorsed an understanding of same. Pt given opportunity to verbalize questions and/or concerns in regard to discharge teaching, pt denies questions/concerns. Pt A&O x4, no obvious distress noted, respirations equal/unlabored. Pt allowed to ambulate out of ED independently.

## 2022-04-01 NOTE — Unmapped (Unsigned)
Left knee severe medial compartment osteoarthritis, Right knee osteoarthritis; Gelsyn injection #2/3 (Dr. Zenda Alpers) (left > right, last injection series 07/2021 with good relief)     We discussed the Gelsyn injection series for the bilateral knee and the patient desired to proceed.     Procedure: After sterile prep, 2ml Gelsyn was injected into the left knee. The patient tolerated the procedure well.     Procedure: After sterile prep, 2ml Gelsyn was injected into the right knee. The patient tolerated the procedure well.     Follow-up in 1 week for the third bilateral knee Gelsyn injection.

## 2022-04-01 NOTE — Unmapped (Signed)
CHIEF COMPLAINT:   Chief Complaint   Patient presents with    Flank Pain        HPI   Natalie [name] is a 52 y.o. female who presents to the Emergency Department complaining of flank pain.  Patient denies any nausea associated with that there is a baseline amount of pain but then it increases with stabbing pain.  She has had a history of previous kidney stones.  She states that has been going on and off for the past couple weeks but then she had several days without any pain at all and then it came back starting over the past couple of days again.  No blood in the urine but states she has had previous hematuria at various levels not associated with kidney stones but also has a history of kidney stones.  Pain also in the left lower quadrant.  No diarrhea no vomiting.  No fever.  First noticed all the symptoms end up going to [facility] had a CT showed a right-sided kidney stone.    REVIEW OF SYSTEMS:   In addition to that documented in the HPI above, the additional ROS was obtained:  Constitutional: Denies fevers or chills  Eyes: Denies vision changes  ENMT: Denies sore throat  CV: Denies chest pain  Resp: Denies SOB  GI: Denies vomiting or diarrhea  GU: Denies painful urination  MSK: Denies recent trauma  Skin: Denies new rashes  Neuro: Denies new numbness or tingling or weakness  All other review of systems negative    PAST MEDICAL HISTORY:    Past Medical History:   Diagnosis Date    Allergic conjunctivitis 11/23/2018    Anemia 1986    Arthritis     Breast mass     Chondromalacia of left knee     Chondromalacia of right knee     Depressive disorder     Fibrocystic breast     GERD (gastroesophageal reflux disease)     Hematuria     History of kidney stones     History of transfusion     Hyperlipidemia 1999    Hypothyroidism     Lumbar disc disease 1998    She was injured at work at the age of 89 and then had disk surgery 2 years later     Menopause ovarian failure     Menopause, premature     Migraine with aura     Neuromuscular disorder (CMS-HCC) 1999    Obesity (BMI 30-39.9) 11/23/2018    Ovarian cyst     Plantar fasciitis     Pseudotumor cerebri     Rash due to allergy 11/23/2018    Sickle cell trait (CMS-HCC)     Urinary tract infection     Vaginitis       Past Surgical History:   Procedure Laterality Date    BACK SURGERY  2001    BREAST BIOPSY Bilateral     When patient was 16 & 17 Both Negative    HYSTERECTOMY  2008    PR REMOVAL OF TONSILS,13+ Y/O Bilateral 10/10/2019    Procedure: TONSILLECTOMY;  Surgeon: Lona Millard, MD;  Location: [place];  Service: ENT    SALPINGOOPHORECTOMY Bilateral 2007    SPINE SURGERY  2001    TOTAL VAGINAL HYSTERECTOMY  01/04/2006    Uterine Prolapse-Dr. Leeanne Rio OP Note    TUBAL LIGATION  1995        MEDICATIONS:   Current Facility-Administered Medications  Medication Dose Route Frequency Provider Last Rate Last Admin    omalizumab Geoffry Paradise) injection 300 mg  300 mg Subcutaneous Q28 Days Volertas, Sofija Dalia, MD   300 mg at 03/09/22 1441     Current Outpatient Medications   Medication Sig Dispense Refill    albuterol HFA 90 mcg/actuation inhaler Inhale 2 puffs every four (4) hours as needed for wheezing. 25.5 g 0    azelastine (ASTELIN) 137 mcg (0.1 %) nasal spray 2 sprays into each nostril Two (2) times a day. 30 mL 12    azelastine (OPTIVAR) 0.05 % ophthalmic solution Administer 2 drops to both eyes daily as needed. 6 mL 5    baclofen (LIORESAL) 20 MG tablet 10 - 20 mg three times a day as needed for spasms. 90 tablet 2    cetirizine (ZYRTEC) 10 MG chewable tablet Chew 1 tablet (10 mg total) at bedtime as needed for allergies.      diclofenac sodium (PENNSAID) 20 mg/gram /actuation(2 %) sopm Apply 2 pumps to affected area up to four times a day. 112 g 2    dicyclomine (BENTYL) 20 mg tablet Take 1 tablet (20 mg total) by mouth two (2) times a day for 10 days. 20 tablet 0    empty container Misc Use as directed to dispose of Xolair syringes 1 each 2 EPINEPHrine (EPIPEN) 0.3 mg/0.3 mL injection Inject 0.3 mL (0.3 mg total) into the muscle once for 1 dose. 2 each 0    EPINEPHrine (EPIPEN) 0.3 mg/0.3 mL injection Inject 0.3 mL (0.3 mg total) into the muscle once as needed for anaphylaxis (Difficulty breathing, throat closing, etc) for up to 1 dose. 1 each 12    estradioL (ESTRACE) 0.5 MG tablet Take 1 tablet (0.5 mg total) by mouth daily. 30 tablet 1    famotidine (PEPCID) 20 MG tablet Take 1 tablet (20 mg total) by mouth two (2) times a day.      gabapentin (NEURONTIN) 800 MG tablet Take 1 tablet (800 mg total) by mouth two (2) times a day. 180 tablet 0    hydroCHLOROthiazide (HYDRODIURIL) 12.5 MG tablet TAKE 1 TABLET(12.5 MG) BY MOUTH DAILY AS NEEDED FOR SWELLING 30 tablet 5    methylPREDNISolone (MEDROL DOSEPACK) 4 mg tablet follow package directions 1 each 0    metroNIDAZOLE (FLAGYL) 500 MG tablet Take 1 tablet (500 mg total) by mouth two (2) times a day.      montelukast (SINGULAIR) 10 mg tablet Take 1 tablet (10 mg total) by mouth nightly. 30 tablet 2    naloxone (NARCAN) 4 mg nasal spray One spray in either nostril once for known/suspected opioid overdose. May repeat every 2-3 minutes in alternating nostril til EMS arrives 1 each 0    nortriptyline (PAMELOR) 25 MG capsule Take 1 capsule (25 mg total) by mouth nightly. 90 capsule 1    olopatadine (PATANOL) 0.1 % ophthalmic solution Administer 1 drop to both eyes Two (2) times a day. 5 mL 1    omalizumab (XOLAIR) 150 mg/mL syringe Inject the contents of 2 syringes (300 mg total) under the skin every twenty-eight (28) days. 2 mL 11    oxyCODONE-acetaminophen (PERCOCET) 10-325 mg per tablet Take 1 tablet by mouth every six (6) hours as needed for pain. Max 4 tabs/day. Fill on or after: 02/06/22. Brand name only due to allergy. 120 tablet 0    pantoprazole (PROTONIX) 40 MG tablet Take 1 tablet (40 mg total) by mouth daily as needed (take it 30 mins  prior to breakfast). 30 tablet 3    potassium chloride 20 MEQ CR tablet Take 1 tablet (20 mEq total) by mouth daily. 90 tablet 2    PROCHAMBER Spcr       rizatriptan (MAXALT-MLT) 10 MG disintegrating tablet Take 1 tablet by mouth at ONSET of migraine or aura. May repeat in 2 HOURS if needed. DO NOT EXCEED 2 doses/day, 2 days/week, 8 doses/month. 10 tablet 5    rosuvastatin (CRESTOR) 10 MG tablet TAKE 1 TABLET(10 MG) BY MOUTH EVERY NIGHT 90 tablet 0    tamsulosin (FLOMAX) 0.4 mg capsule Take 1 capsule (0.4 mg total) by mouth daily for 10 days. 10 capsule 0    triamcinolone (KENALOG) 0.1 % cream Apply 1 application topically two (2) times a day as needed. WHEN ALLERGIES FLARE-UP AND CAUSES RASH / ITCHING 80 g 0    XHANCE 93 mcg/actuation AerB 1 spray into each nostril two (2) times a day as needed. 16 mL 11       ALLERGIES:   Allergies   Allergen Reactions    Buprenorphine Hcl Itching and Swelling    Pregabalin Swelling    Adhesive Tape-Silicones Itching     Band-aids ok.    Doxycycline Hyclate (Bulk) Nausea And Vomiting     GI Upset    Keflex [Cephalexin] Rash    Opioids - Morphine Analogues Itching    Oxycodone-Acetaminophen Itching and Nausea And Vomiting     GI Upset- GENERIC ONLY- able to take the brand name Percocets         FAMILY HISTORY:   Family History   Problem Relation Age of Onset    Heart attack Father     Mental illness Father     Asthma Father     Hypertension Mother     Heart disease Mother     Arthritis Mother     Depression Mother     Drug abuse Mother     Mental illness Mother     Ulcers Mother     COPD Mother     Heart attack Maternal Grandmother     Heart disease Maternal Grandmother     Hypertension Maternal Grandmother     Thyroid disease Maternal Grandmother     Breast cancer Cousin 60    Cancer Sister         lymphoma    COPD Maternal Aunt     Depression Son     Drug abuse Son     Mental illness Son     Stroke Paternal Grandfather     Thyroid disease Other     Alcohol abuse Neg Hx        SOCIAL HISTORY:  Social History     Tobacco Use    Smoking status: Never     Passive exposure: Past    Smokeless tobacco: Never   Vaping Use    Vaping status: Never Used   Substance Use Topics    Alcohol use: Never    Drug use: Never       PHYSICAL EXAM:  VITAL SIGNS: BP 133/87  - Pulse 80  - Temp 36.4 ??C (97.5 ??F) (Oral)  - Resp 18  - SpO2 100%   General: Awake and alert, no acute distress lying in the bed  Eyes: Normal appearance, no icterus noted, extraocular movements are intact, pupils are equal and reactive  HENT: NCAT. Oropharynx clear, mucous membranes are moist.  Neck: Supple, normal ROM.  Cardiovascular: Regular rate and rhythm, normal  S1 and S2.  Pulmonology: Normal work of breathing, clear to auscultation bilaterally.  Abdomen: Nontender and nondistended, no rebound or guarding, left-sided CVA tenderness, left lower quadrant tenderness to deep palpation.  GU: Deferred  Back: No masses or obvious deformity.  Musculoskeletal: No obvious deformity.  Normal range of motion of the joints  Skin: Warm, dry, no rash.  Lymphatic: No cervical lymphadenopathy  Neurologic: GCS 15. Alert and oriented x3, able to ambulate  Psychiatric: Cooperative, mental status and affect normal, normal speech pattern and content.     LABS:   Labs Reviewed   COMPREHENSIVE METABOLIC PANEL - Abnormal; Notable for the following components:       Result Value    CO2 33.0 (*)     AST 41 (*)     Alkaline Phosphatase 44 (*)     All other components within normal limits   URINALYSIS WITH MICROSCOPY - Abnormal; Notable for the following components:    Leukocyte Esterase, UA Trace (*)     Blood, UA Moderate (*)     RBC, UA 23 (*)     All other components within normal limits   POCT CREATININE, INTERFACED - Abnormal; Notable for the following components:    Creatinine Whole Blood, POC 0.55 (*)     All other components within normal limits   CBC W/ AUTO DIFF - Abnormal; Notable for the following components:    Absolute Monocytes 0.9 (*)     All other components within normal limits   LIPASE - Normal   CBC W/ DIFFERENTIAL    Narrative:     The following orders were created for panel order CBC w/ Differential.                  Procedure                               Abnormality         Status                                     ---------                               -----------         ------                                     CBC w/ Differential[[id number]]         Abnormal            Final result                                                 Please view results for these tests on the individual orders.       RADIOLOGY:  CT Abdomen Pelvis W IV Contrast   Final Result   1.  No acute process.   2.  Chronic changes as above.      Signed (Electronic Signature): 04/01/2022 3:00 AM    Signed By: Gomez Cleverly, MD  EKG:  No results found for this visit on 04/01/22 (from the past 4464 hour(s)).    ED COURSE/MDM:   Outside medical record review: Per chart review: Patient with most recent outpatient evaluation 03/30/2022 where the patient seen by pain management.  She has got a history of cervical pain with osteoarthritis pseudotumor cerebri.      Outside result review: The patient otherwise with most recent outside hospital laboratories 03/26/2022 with a urinalysis showing small hemoglobin.  CBC at [facility] white count 6.5 hemoglobin 12.4 BMP with a creatinine 0.59.  Patient with CT of the abdomen pelvis 03/25/2022 showing an no acute process hyperdensity in the transverse colon reflecting ingested material.    Differential Diagnosis: Patient presents with abdominal pain.  I have considered pyelonephritis I have considered UTI have considered ureteral colic have considered diverticulitis.    ED Course: Patient was seen and examined meeting was put to the room.  The patient looks well and nontoxic.  Patient with left flank pain.  Will get a CT and laboratories.     I have independently interpreted the patient's CT of the abdomen pelvis with IV contrast.  I do not see any ureteral stones I do not see any hydronephrosis consistent with ureteral colic.  Patient does have a large stool burden.  Otherwise do not see any inflammatory changes in the left lower quadrant.    347: Radiology is reviewed the patient's imaging.  No acute process.  Laboratories are completely normal.  There is hemoglobinuria but of uncertain significance.  Patient was ultimately referred to GI.  Patient was discharged home in stable condition.  Bentyl provided.    Medications administered during this encounter:  Medications   fentaNYL (PF) (SUBLIMAZE) injection 50 mcg (50 mcg Intravenous Given 04/01/22 0105)   ketorolac (TORADOL) injection 15 mg (15 mg Intravenous Given 04/01/22 0053)   iohexol (OMNIPAQUE) 350 mg iodine/mL solution 100 mL (100 mL Intravenous Given 04/01/22 0156)       IMPRESSION:   1. Left flank pain    2. Hematuria, unspecified type        DISPOSITION:  Discharge home, stable condition    FOLLOW[place]  [address]  [address]  [place] [address]  [phone]    Call in 3 days      [facility]  [address]  [address]  [place] [address]  [phone]  Call in 3 days        DISCHARGE MEDICATIONS:  New Prescriptions    DICYCLOMINE (BENTYL) 20 MG TABLET    Take 1 tablet (20 mg total) by mouth two (2) times a day for 10 days.        Karen Kitchens, MD  04/01/22 [phone]

## 2022-04-02 ENCOUNTER — Institutional Professional Consult (permissible substitution): Admit: 2022-04-02 | Discharge: 2022-04-03 | Payer: MEDICAID

## 2022-04-02 DIAGNOSIS — M170 Bilateral primary osteoarthritis of knee: Principal | ICD-10-CM

## 2022-04-02 MED ADMIN — sodium hyaluronate (viscosup) (GELSYN-3) 16.8 mg/2 mL injection 16.8 mg: 16.8 mg | INTRA_ARTICULAR | @ 16:00:00 | Stop: 2022-04-02

## 2022-04-06 DIAGNOSIS — M47812 Spondylosis without myelopathy or radiculopathy, cervical region: Principal | ICD-10-CM

## 2022-04-06 NOTE — Unmapped (Signed)
Left knee severe medial compartment osteoarthritis, Right knee osteoarthritis; Gelsyn injection #3/3 (Dr. Zenda Alpers) (left > right, last injection series 07/2021 with good relief) (100% improved on the right, 85-90%% improved on the left)     We discussed the Gelsyn injection series for the bilateral knee and the patient desired to proceed.     Procedure: After sterile prep, 2ml Gelsyn was injected into the left knee. The patient tolerated the procedure well.     Procedure: After sterile prep, 2ml Gelsyn was injected into the right knee. The patient tolerated the procedure well.     Follow-up in 6 weeks as needed.

## 2022-04-06 NOTE — Unmapped (Addendum)
[facility]  [address]  [place], [place] [address]      Pharmacist Chronic Pain Medication Management Visit Summary    Assessment/Plan:   Natalie [name] is a 52 y.o. adult who is being followed at the [facility] with a history of chronic pain localized to bilateral shoulder, knee as well as cervical thoracic and lumbar pain with history of L5-S1 spinal fusion and the pain is associated with bilateral radiculopathy. She was first seen in clinic in August 2018. She has a history of migraines and pseudotumor cerebri, followed by neurology and ophthalmology for treatment.  She has had moderate relief with bilateral knee injections in the past. She stated that she received a caudal epidural steroid injection in the past with good results. She has also had trigger point injections for her paracervical, shoulders, upper mid and lower back areas in 2017.  We have since repeated her caudal ESI and TPI to good effect.  She had a flare of pain in early 2019 and was overtaking her percocet.  After injections pain improved and so did compliance with medications.  Compliance concerns in 2021 that led to additional oversight and then transitioning to [facility] in 10/2019. However, she did not tolerate belbuca or nucynta and was transitioned back to Percocet but with closer oversight and assistance from pain psychology. She had a flare of pain after her L3-5 RFAs in November 2022.  Between medications and procedures, she has been able to continue working. She now teaches water aerobics.         1. Cervical spondylosis    2. Chronic pain syndrome    3. Myofascial pain syndrome    4. Chronic, continuous use of opioids      Today, patient reports her chronic pain is unchanged, though she has been experiencing acute L flank pain with associated L leg numbness for the past 3 weeks. Although most CT imaging from recent ED visit on 04/01/22 did not show presence of ureteral stones, the etiology of her acute pain is still unclear. Given her new onset symptoms, we will initiate celecoxib 200 mg daily for short term use, acetaminophen 500 mg BID PRN, and gabapentin 100 mg daily in the morning. Patient has a history of sedation with current gabapentin 800 mg nightly, thus will be cautious with initiating gabapentin daytime dose. Regarding chronic pain, patient feels that current medications are providing adequate pain control, thus we refilled her oxycodone/APAP and baclofen without change. We advised the patient to follow up with her PCP regarding her flank pain and she will follow up with Dr. Oda Kilts for evaluation of L leg numbness.    Continue oxycodone/APAP 10/325 mg Q6H PRN; refilled x 3 months  Continue baclofen 20 mg TID; refilled x 3 months  Start gabapentin 100 mg daily in the morning; refilled x 3 months  Continue gabapentin 800 mg nightly  Start celecoxib 200 mg daily; refilled x 1 month  Start acetaminophen 500 mg BID PRN  Continue nortriptyline 50 mg at bedtime  Continue diclofenac 1% gel PRN  Patient scheduled for TPIs and Cervical RFA  Urine toxicology screen is not due today.  Treatment agreement renewal is not due today.     The patient does  appear to be utilizing pain medications appropriately and does  report that the medications do improve patient's quality of life and functionality level.    Medication Monitoring:   Last pain medication agreement on file and  signed on  01/11/22.  Last urine toxicology:  01/11/22.  Appropriate.  Patient did bring bottles for pill counts today.  Counts indicate appropriate use  [place] database was reviewed today and is appropriate:  Last fill date for oxycodone/APAP was 04/07/22.  Aberrancies: Did not bring medications 10/2016, no show 04/2017, overtaking 06/2017, short on 09/27/17, lost Rx 10/2017, 01/2018-too late to be seen, 04/2019- 4 days short, but reports some tabs in med box at home. 07/2019 - short, states that she left 28 pills at home and 5 in her car. 08/2019- short, says she overtook during acute increase in pain in setting of acute sinusitis/strep throat. 09/2019- short, says she is unsure why her pill count is low and denies overuse. 10/2019 Did not bring Belbuca to appointment, 11/28/2019 - Did not bring Percocet for pill count, 06/2021 - Did not bring Percocet   ECG: N/A  Last Opioid Increase:  Percocet 10-325 decreased from 5x daily PRN to 4x daily PRN 05/2016, 01/2018-increase to #132, several changes in Fall 2021 due to compliance concerns, 11/2019-back to percocet #90, 05/2020-back to #120   Oral Morphine Equivalents: 60  On a Benzodiazepine: no   Naloxone last Ordered:  12/31/20      Return to clinic to see physician for first available appointment.    Today I have prescribed:  Requested Prescriptions     Signed Prescriptions Disp Refills    oxyCODONE-acetaminophen (PERCOCET) 10-325 mg per tablet 120 tablet 0     Sig: Take 1 tablet by mouth every six (6) hours as needed for pain. Max 4 tabs/day. Fill on or after: 05/07/22. Brand name only due to allergy.    oxyCODONE-acetaminophen (PERCOCET) 10-325 mg per tablet 120 tablet 0     Sig: Take 1 tablet by mouth every six (6) hours as needed for pain. Max 4 tabs/day. Fill on or after: 06/06/22. Brand name only due to allergy.    oxyCODONE-acetaminophen (PERCOCET) 10-325 mg per tablet 120 tablet 0     Sig: Take 1 tablet by mouth every four (4) hours as needed for pain. Max 4 tabs/day. Fill on or after: 07/06/22. Brand name only due to allergy.    gabapentin (NEURONTIN) 100 MG capsule 90 capsule 0     Sig: Take 1 capsule (100 mg total) by mouth in the morning.    baclofen (LIORESAL) 20 MG tablet 90 tablet 2     Sig: 10 - 20 mg three times a day as needed for spasms.    celecoxib (CELEBREX) 200 MG capsule 30 capsule 0     Sig: Take 1 capsule (200 mg total) by mouth daily.       No orders of the defined types were placed in this encounter.      I spent a total of 30 minutes face to face with the patient delivering clinical care and providing education/counseling.    Medications reviewed in EPIC medication station and updated today by the clinical pharmacist practitioner.    I have personally consulted with Dr. Oda Kilts regarding [name] ???s medication regimen prior to prescribing controlled substances today and they are in agreement with the plan.    I have reviewed the [facility] Medical Board statement on use of controlled substances for the treatment of pain as well as the CDC Guideline for Prescribing Opioids for Chronic Pain.  I have reviewed the [place] Controlled Substance Monitoring Database.    Lurline Del, PharmD  PGY-2 Pharmacy Resident    Al Corpus, PharmD, CPP  ______________________________________________________________________    History of Present Illness:     Reason for Visit:  Medication management for Chronic Pain.    Attending Pain Physician/Last Visit Date:  Dr. Oda Kilts  on 01/11/22  Last Pain Visit Date: 01/11/22  Last Pain Visit Provider: Dr. Marylen Ponto Natalie [name] is a 52 y.o. adult who is being followed at the [facility] with a history of chronic pain localized to her neck, upper back, lower back, bilateral hips and knees.       At last visit, patient reported ongoing neck pain. She had recent TPIs which helped everything besides her neck. She stopped going to the chiropractor as he stated he cannot really help her as its like putting a bandaid on a shotgun so he wanted her to go elsewhere. Patient endorsed that her medication regimen helped control her pain, and her medications were refilled without change. She also received a cervical MBB on 02/05/22, TPI on 03/11/22, MBB on 03/30/22, and Gelsyn injections to knees on 03/26/22 & 04/02/22.     Since last visit the patient reports chronic pain is unchanged, though she has been experiencing acute L flank pain for the past 3 weeks, resulting in 2 ED visits on 02/292/24 and 04/01/22. At the last ED visit, CT imaging did not show any ureteral stones, and patient is unsure of the cause of her acute pain. She also endorses new numbness radiating throughout her L leg and L foot. She is requesting additional medications to help with this acute pain as she has found herself to be more irritable towards others when her pain is uncontrolled. Regarding her chronic pain, she feels that the pain relief from her MBB has worn off, but she feels that her current medications are keeping her neck pain at bay.     In regards to medications currently taken for pain management the patient is tolerating these medications well and denies associated side effects none. Patient denies misuse, abuse or diversion of medications. Patient reports being stable on this medication regimen and thinks that the medications do improve patient's quality of life and do improve patient's functionality level. Patient reports that the patient is able to perform majority of ADLs on the current regimen.     Patient denies homicidal/suicidal ideation.    Current Pain Medication Regimen:  - Oxycodone/APAP 10/325 mg Q6H PRN  - Gabapentin 800 mg BID - taking daily due to daytime drowsiness  - Voltaren gel - using regularly  - Baclofen 20mg  TID  - Nortriptyline 50 mg at bedtime (per neurologist)    Medications tried in past:   NSAIDS - celebrex, ibuprofen, naproxen  Antidepressants- nortriptyline  Anticonvulsant - gabapentin, pregabalin  Muscle relaxants - baclofen, flexeril, skelaxin, robaxin, tizanidine  Topicals - voltaren gel, lidoderm,   Short-acting opiates - hydromorphone, percocet, oxycodone, tramadol, ultracet, vicodin, nucynta IR  Long-acting opiates - fentanyl, morphine, methadone, oxycontin. Sedation with long acting opioids. Belbuca (side effects)  Anxiolytics - diazepam  Other-     Subjective:     [facility] Patient-Entered Hpi Selection Questionnaire       Question 04/01/2022  4:03 AM EDT - Ceasar Mons by Patient    What is the primary reason for your visit? Neurological Problem    Are you having any of these problems?     Confusion or odd behavior No    Clumsiness No    Loss of feeling in a part of your body No    Weakness in  a part of your body No    Loss of balance No    Memory problems No    Nearly fainting No    Slurred speech No    Fainting No    Vision change No    Weakness No    Your problem is a... New problem    When did you first notice this problem? 1 to 4 weeks ago    How would you describe the start of your neurological problem? Suddenly    Since you first noticed this problem, how has it changed? Unchanged    In which part of your body have you noticed this problem? Left side    Are you experiencing any of the following symptoms?     Abdominal pain Yes    Hearing changes No    An unusual sensation (light, sound, odor) before headache or seizure No    Back pain Yes    Bladder incontinence No    Bowel incontinence No    Chest pain No    Confusion No    Sweating much more than normal No    Dizziness No    Fatigue No    Fever No    Headaches Yes    Light-headedness Yes    Nausea No    Neck pain Yes    Pounding in the chest No    Shortness of breath No    Feeling like the room is spinning No    Vomiting No    Which of the following treatments have you tried? Acetaminophen (Tylenol)    If you've tried a treatment for this problem, how much relief did you experience? None          [facility] Return Patient Questionnaire       Question 04/02/2022 10:28 PM EDT - Filed by Patient    What is the reason for your visit? Medication Refill    Date of onset of your pain: 08/11/1997    Please rate your pain at its WORST in the past month. 10    Please rate your pain at its LEAST in the past month. 6    Please rate your pain as it is RIGHT NOW. 7    Please rate your pain on AVERAGE in the past month. 8    Please circle the location of your pain.       Please select the words that describe your pain. Aching     Burning     Dull     Nauseating Pulling     Pulsing     Sharp     shooting     Sore     Stabbing Tender Throbbing    How often do you have pain? All the time    When is your pain the worst? During the day    Which of the following have been negatively affected by your pain? General activity     Mood     Recreational activities     Relationships with people     Sleep     Walking     Sitting     Standing    Since your last visit:     Have you had any of the following? Emergency Room visit     Primary Care Visit     Death in the Family     Spinal or non-spinal procedures    Do you have any new pain you would  like to discuss with your doctor? No    How has your pain changed? Stayed the same    Are you currently taking any blood-thinners or anticoagulants? No    If you are on Pain Medication - Are you having any of the following? I am stable on my current medication regime     My medications help to improve my quality of live    If you have had a procedure since your last visit, how much pain relief was obtained? 80%    If you have had a procedure since your last visit, were there any complications? No    General: Snoring    Cardiovascular:       Gastrointestinal - (Intestinal):       Skin: Hives     Itching    Endocrine (Hormonal System): Hot Flashes     Decreased sex drive    Musculoskeletal System - (Muscles, Joints and Coverings): Back pain    Neurologic: Headaches/Migraines     Numbness/Tingling    Psychiatric:        Adverse Effects of Pain Medications:   Constipation:  no   Sedation:  no.    Reported Effectiveness of Pain Medications since last visit:    Patient documents that their pain is worse since their last visit.  They documented that they are stable on their current regimen and that the medications do help to improve the quality of their life.    Objective:     PAST MEDICAL HISTORY:    Active Ambulatory Problems     Diagnosis Date Noted    Hypothyroidism 08/09/2012    Chronic pain syndrome 10/11/2012    Encounter for Medicare annual wellness exam 09/08/2008    Premature menopause 09/21/2012    Facet arthritis of lumbar region 06/28/2013    Pseudotumor cerebri 12/28/2013    Allergic rhinitis 12/28/2013    Arthritis of wrist, right 05/28/2014    Primary osteoarthritis of both knees 07/30/2014    Trochanteric bursitis of both hips 10/29/2014    Migraine without aura and without status migrainosus, not intractable 03/04/2015    Myofascial pain syndrome 03/28/2015    Allergic conjunctivitis 11/23/2018    Rash due to allergy 11/23/2018    Uncomplicated opioid dependence (CMS-HCC) 11/23/2018    Obesity (BMI 30-39.9) 11/23/2018    Educated about COVID-19 virus infection 11/23/2018    Chronic tension-type headache, intractable 10/02/2020    Greater trochanteric bursitis of right hip 11/04/2020    Primary osteoarthritis of right knee 11/04/2020    Primary osteoarthritis of left knee 11/04/2020    Pain medication agreement signed 01/11/2022     Resolved Ambulatory Problems     Diagnosis Date Noted    Candidiasis of vulva and vagina 06/01/2010    Cerebral edema (CMS-HCC) 10/27/2010    Migraine with aura 12/19/2012    Contact with or exposure to communicable disease 10/23/2009    Depressive disorder 09/08/2008    Muscular wasting and disuse atrophy 07/27/2012    Dysuria 07/01/2012    Sleep disturbances 09/08/2008    Headache 07/01/2012    Hematuria 09/08/2008    Obstructive hydrocephalus (CMS-HCC) 12/19/2012    Lumbosacral spondylosis 07/01/2012    Memory impairment 05/26/2012    Migraine 05/26/2012    Myalgia and myositis 04/30/2013    Nausea without vomiting 09/08/2008    Palpitations 07/01/2012    Postlaminectomy syndrome, lumbar region 11/17/2012    Primary localized osteoarthrosis, lower leg 10/26/2012    Pruritus of genital  organs 03/18/2011    Benign intracranial hypertension 05/26/2012    Reflux esophagitis 09/08/2008    Esophageal reflux 10/27/2010    Chronic sinusitis 07/01/2012    Edema 09/08/2008    Thyroid disease 05/26/2012    Urinary incontinence 07/01/2012    Urticaria 09/08/2008    Leukorrhea 05/26/2012    Vitamin D deficiency 08/09/2012    Candidiasis 05/26/2012    Plantar fasciitis, bilateral 08/28/2013    Chronic vaginitis 03/04/2015    Pain medication agreement signed 09/28/2016    Food insecurity 11/23/2018    Pain medication agreement signed 11/12/2020     Past Medical History:   Diagnosis Date    Anemia 1986    Arthritis     Breast mass     Chondromalacia of left knee     Chondromalacia of right knee     Fibrocystic breast     GERD (gastroesophageal reflux disease)     History of kidney stones     History of transfusion     Hyperlipidemia 1999    Lumbar disc disease 1998    Menopause ovarian failure     Menopause, premature     Neuromuscular disorder (CMS-HCC) 1999    Ovarian cyst     Plantar fasciitis     Sickle cell trait (CMS-HCC)     Urinary tract infection     Vaginitis        Outpatient Encounter Medications as of 04/08/2022   Medication Sig Dispense Refill    albuterol HFA 90 mcg/actuation inhaler Inhale 2 puffs every four (4) hours as needed for wheezing. 25.5 g 0    azelastine (ASTELIN) 137 mcg (0.1 %) nasal spray 2 sprays into each nostril Two (2) times a day. 30 mL 12    azelastine (OPTIVAR) 0.05 % ophthalmic solution Administer 2 drops to both eyes daily as needed. 6 mL 5    cetirizine (ZYRTEC) 10 MG chewable tablet Chew 1 tablet (10 mg total) at bedtime as needed for allergies.      diclofenac sodium (PENNSAID) 20 mg/gram /actuation(2 %) sopm Apply 2 pumps to affected area up to four times a day. 112 g 2    dicyclomine (BENTYL) 20 mg tablet Take 1 tablet (20 mg total) by mouth two (2) times a day for 10 days. 20 tablet 0    empty container Misc Use as directed to dispose of Xolair syringes 1 each 2    EPINEPHrine (EPIPEN) 0.3 mg/0.3 mL injection Inject 0.3 mL (0.3 mg total) into the muscle once as needed for anaphylaxis (Difficulty breathing, throat closing, etc) for up to 1 dose. 1 each 12    estradioL (ESTRACE) 0.5 MG tablet Take 1 tablet (0.5 mg total) by mouth daily. 30 tablet 1    famotidine (PEPCID) 20 MG tablet Take 1 tablet (20 mg total) by mouth two (2) times a day.      gabapentin (NEURONTIN) 800 MG tablet Take 1 tablet (800 mg total) by mouth two (2) times a day. (Patient taking differently: Take 1 tablet (800 mg total) by mouth daily.) 180 tablet 0    hydroCHLOROthiazide (HYDRODIURIL) 12.5 MG tablet TAKE 1 TABLET(12.5 MG) BY MOUTH DAILY AS NEEDED FOR SWELLING 30 tablet 5    montelukast (SINGULAIR) 10 mg tablet Take 1 tablet (10 mg total) by mouth nightly. 30 tablet 2    naloxone (NARCAN) 4 mg nasal spray One spray in either nostril once for known/suspected opioid overdose. May repeat every 2-3 minutes in alternating nostril til  EMS arrives 1 each 0    nortriptyline (PAMELOR) 25 MG capsule Take 1 capsule (25 mg total) by mouth nightly. 90 capsule 1    olopatadine (PATANOL) 0.1 % ophthalmic solution Administer 1 drop to both eyes Two (2) times a day. 5 mL 1    omalizumab (XOLAIR) 150 mg/mL syringe Inject the contents of 2 syringes (300 mg total) under the skin every twenty-eight (28) days. 2 mL 11    pantoprazole (PROTONIX) 40 MG tablet Take 1 tablet (40 mg total) by mouth daily as needed (take it 30 mins prior to breakfast). 30 tablet 3    potassium chloride 20 MEQ CR tablet Take 1 tablet (20 mEq total) by mouth daily. 90 tablet 2    PROCHAMBER Spcr       rizatriptan (MAXALT-MLT) 10 MG disintegrating tablet Take 1 tablet by mouth at ONSET of migraine or aura. May repeat in 2 HOURS if needed. DO NOT EXCEED 2 doses/day, 2 days/week, 8 doses/month. 10 tablet 5    rosuvastatin (CRESTOR) 10 MG tablet TAKE 1 TABLET(10 MG) BY MOUTH EVERY NIGHT 90 tablet 0    triamcinolone (KENALOG) 0.1 % cream Apply 1 application topically two (2) times a day as needed. WHEN ALLERGIES FLARE-UP AND CAUSES RASH / ITCHING 80 g 0    XHANCE 93 mcg/actuation AerB 1 spray into each nostril two (2) times a day as needed. 16 mL 11    [DISCONTINUED] baclofen (LIORESAL) 20 MG tablet 10 - 20 mg three times a day as needed for spasms. 90 tablet 2    [DISCONTINUED] methylPREDNISolone (MEDROL DOSEPACK) 4 mg tablet follow package directions 1 each 0    [DISCONTINUED] oxyCODONE-acetaminophen (PERCOCET) 10-325 mg per tablet Take 1 tablet by mouth every six (6) hours as needed for pain. Max 4 tabs/day. Fill on or after: 02/06/22. Brand name only due to allergy. 120 tablet 0    baclofen (LIORESAL) 20 MG tablet 10 - 20 mg three times a day as needed for spasms. 90 tablet 2    celecoxib (CELEBREX) 200 MG capsule Take 1 capsule (200 mg total) by mouth daily. 30 capsule 0    EPINEPHrine (EPIPEN) 0.3 mg/0.3 mL injection Inject 0.3 mL (0.3 mg total) into the muscle once for 1 dose. 2 each 0    gabapentin (NEURONTIN) 100 MG capsule Take 1 capsule (100 mg total) by mouth in the morning. 90 capsule 0    [START ON 05/07/2022] oxyCODONE-acetaminophen (PERCOCET) 10-325 mg per tablet Take 1 tablet by mouth every six (6) hours as needed for pain. Max 4 tabs/day. Fill on or after: 05/07/22. Brand name only due to allergy. 120 tablet 0    [START ON 06/06/2022] oxyCODONE-acetaminophen (PERCOCET) 10-325 mg per tablet Take 1 tablet by mouth every six (6) hours as needed for pain. Max 4 tabs/day. Fill on or after: 06/06/22. Brand name only due to allergy. 120 tablet 0    [START ON 07/06/2022] oxyCODONE-acetaminophen (PERCOCET) 10-325 mg per tablet Take 1 tablet by mouth every four (4) hours as needed for pain. Max 4 tabs/day. Fill on or after: 07/06/22. Brand name only due to allergy. 120 tablet 0    [DISCONTINUED] metroNIDAZOLE (FLAGYL) 500 MG tablet Take 1 tablet (500 mg total) by mouth two (2) times a day.      [DISCONTINUED] nitrofurantoin, macrocrystal-monohydrate, (MACROBID) 100 MG capsule Take 1 capsule (100 mg total) by mouth two (2) times a day for 5 days. 10 capsule 0    [DISCONTINUED] tamsulosin (FLOMAX) 0.4 mg  capsule Take 1 capsule (0.4 mg total) by mouth daily for 10 days. 10 capsule 0     Facility-Administered Encounter Medications as of 04/08/2022   Medication Dose Route Frequency Provider Last Rate Last Admin    omalizumab Geoffry Paradise) injection 300 mg  300 mg Subcutaneous Q28 Days Volertas, Sofija Dalia, MD   300 mg at 03/09/22 1441         Allergies  Allergies   Allergen Reactions    Buprenorphine Hcl Itching and Swelling    Pregabalin Swelling    Adhesive Tape-Silicones Itching     Band-aids ok.    Doxycycline Hyclate (Bulk) Nausea And Vomiting     GI Upset    Keflex [Cephalexin] Rash    Opioids - Morphine Analogues Itching    Oxycodone-Acetaminophen Itching and Nausea And Vomiting     GI Upset- GENERIC ONLY- able to take the brand name Percocets       Physical Examination:  Vitals:   Vitals:    04/08/22 0834   BP: 118/86   Pulse: 85   Resp: 16   Temp: 36.7 ??C (98.1 ??F)   TempSrc: Skin   SpO2: 99%   Weight: 76 kg (167 lb 8 oz)   Height: 154.9 cm (5' 1)     General:  There is no evidence of sedation.  There are no overt pain behaviors observed.    Musculoskeletal:  Patient ambulates without an assistive device

## 2022-04-06 NOTE — Unmapped (Signed)
Natalie [name] is a 52 y.o. female with Cervical spondylosis without myelopathy s/p diagnostic medial branch blocks with good but temporary relief of pain, confirming the facet joints as the source.  We will plan to proceed with bilateral C4, C5, C6 medial branch nerve RFA  upon approval.  After failing conservative measures, through the process of shared decision making, we have decided to move forward with the below procedure.    Procedure ordered: bilateral C4, C5, C6 medial branch nerve RFA  Diagnosis:  Cervical spondylosis  Is the associated diagnosis spondylosis without myelopathy or radiculopathy? Yes. (if myelopathy/radiculopathy present, do not proceed or provide additional information)  Had previously?: No.. Last: N/A  If yes, results (% relief or %improvement in function and duration): N/A  If yes to previous RFA, was there 50% pain improvement for 6 months or 50% consistent improvement of ADL???s or previously painful movements.  N/A    Has the patient completed  diagnostic medial branch blocks with greater than 80% pain relief after each block? Yes.  1st MMB 85% relief for 48 hours (duration) on 02/05/22, then pain returned to baseline  2nd MBB 80% relief for 24 hours (duration) on 03/30/22, then pain returned to baseline  Have conservative options been tried within the past 12 months, including diagnosis-specific physical therapy or home exercise program for at least 6 weeks: Yes.   HEP: Yes. 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  Last PT:   Anticoagulants: None.  Instructions? N/A  Labs required?: No.  Driver: Yes.  PIV: Yes.  Risk category (consider age, hx bleeding issues, anticoagulants, liver dx, kidney dx): intermediate  Expected timing: Next available  Pertinent physical exam findings: 01/11/22-+pain on facet loading bilaterally cervical region.   Length of procedure: 60 minutes (30 for unilateral, 60 for bilateral usually)    Frequency Limitation: For each covered spinal region no more than 2 radiofrequency sessions will be reimbursed per rolling 12 months.     Orders Placed This Encounter   Procedures    RF Denerv Cerv/Thor Add'l Levels (65784)     Montie Gelardi  Quad proc or spine center  Next avail  Bilateral C4, C5, C6 MB RFA  PIV yes  Driver Yes  60 minute slot     Standing Status:   Future     Standing Expiration Date:   04/06/2023    RF Denerv Cerv/Thor Single Level [phone])     Lelania Bia  Quad proc or spine center  Next avail  Bilateral C4, C5, C6 MB RFA  PIV yes  Driver Yes  60 minute slot     Standing Status:   Future     Standing Expiration Date:   04/06/2023

## 2022-04-07 MED ORDER — OXYCODONE-ACETAMINOPHEN 10 MG-325 MG TABLET
ORAL_TABLET | Freq: Four times a day (QID) | ORAL | 0 refills | 30 days | Status: CP | PRN
Start: 2022-04-07 — End: ?

## 2022-04-08 ENCOUNTER — Ambulatory Visit: Admit: 2022-04-08 | Discharge: 2022-04-09 | Payer: MEDICAID

## 2022-04-08 DIAGNOSIS — F1190 Opioid use, unspecified, uncomplicated: Principal | ICD-10-CM

## 2022-04-08 DIAGNOSIS — M7918 Myalgia, other site: Principal | ICD-10-CM

## 2022-04-08 DIAGNOSIS — M47812 Spondylosis without myelopathy or radiculopathy, cervical region: Principal | ICD-10-CM

## 2022-04-08 DIAGNOSIS — G894 Chronic pain syndrome: Principal | ICD-10-CM

## 2022-04-08 MED ORDER — CELECOXIB 200 MG CAPSULE
ORAL_CAPSULE | Freq: Every day | ORAL | 0 refills | 30 days | Status: CP
Start: 2022-04-08 — End: 2022-05-08

## 2022-04-08 MED ORDER — GABAPENTIN 100 MG CAPSULE
ORAL_CAPSULE | Freq: Every day | ORAL | 0 refills | 90 days | Status: CP
Start: 2022-04-08 — End: 2022-10-05

## 2022-04-08 MED ORDER — BACLOFEN 20 MG TABLET
ORAL_TABLET | 2 refills | 0 days | Status: CP
Start: 2022-04-08 — End: ?

## 2022-04-08 NOTE — Unmapped (Addendum)
It was nice to see you.  Today we did the following:    1. Refilled Percocet (refill dates: 05/07/22, 06/06/22, 07/06/22)    2. Start gabapentin 100 mg daily in the morning. Continue taking gabapentin 800 mg at night. If you feel that you would like to increase your morning gabapentin dose, you can use up to 400 mg in the morning (half of your 800 mg tablet).    3. Start Tylenol extra strength (500 mg) twice daily.    4. Start Celebrex 200 mg once daily (to help with your flank pain). Do not combine this with any additional ibuprofen.     Thank you for visiting the [facility].      -Remember to bring all your opioid(narcotic) pill bottles/boxes to every clinic visit, in their original containers.      -Because of the high volume of calls we receive and the high demand for our clinical services, we are occupied all day providing care for patients in the clinic. This leaves little time to respond to phone calls, and we are generally unable to discuss patient care advice over the telephone. If you are experiencing a medication side effect or complication, you can call and let us know, but we will typically not make a medication substitution or change over the telephone.      We are generally unable to respond acutely to a flare up of pain, as this is quite common in our patients and needs to be dealt with as part of the long term management plan. Please make an appointment with [place] if you wish to discuss a matter in any detail. Should you still need to call, please do so at [phone].      Please do not call for early medication refills.      For additional information and services provided by our clinic you may visit our Pain Management Clinic website at:   [email or web address]      Thank you for choosing [facility]. It was a pleasure to see you in clinic today. Please contact us with any questions or concerns at [phone]. Thank you,   Al Corpus, PharmD, CPP

## 2022-04-08 NOTE — Unmapped (Signed)
Spoke with the patient and informed her Dr. Henreitta Leber did send a referral on   03/29/22  Referral w/ labs sent to [facility].  [phone]      She wrote the number down and she will follow up with them. She scheduled an appointment for tomorrow. She is still having the pain and now it has moved from the back to the front. I will let Dr. Henreitta Leber know.

## 2022-04-08 NOTE — Unmapped (Signed)
Patient called and states that she is still haing side pain. Patient was seen in the ED 04/01/2022    Patient wants a referral to Gastroenterology

## 2022-04-08 NOTE — Unmapped (Signed)
Medication:oxyCODONE-acetaminophen (PERCOCET) 10-325 mg per tablet   ZOX:WRUE 1 tablet by mouth every six (6) hours as needed for pain. Max 4 tabs/day   Quantity on RX: #120  Filled on:04/07/22  Pill count today: # 116

## 2022-04-09 ENCOUNTER — Ambulatory Visit: Admit: 2022-04-09 | Discharge: 2022-04-09 | Payer: MEDICARE

## 2022-04-09 ENCOUNTER — Institutional Professional Consult (permissible substitution): Admit: 2022-04-09 | Discharge: 2022-04-09 | Payer: MEDICARE

## 2022-04-09 ENCOUNTER — Ambulatory Visit: Admit: 2022-04-09 | Discharge: 2022-04-09 | Payer: MEDICAID

## 2022-04-09 DIAGNOSIS — R1032 Left lower quadrant pain: Principal | ICD-10-CM

## 2022-04-09 DIAGNOSIS — K5792 Diverticulitis of intestine, part unspecified, without perforation or abscess without bleeding: Principal | ICD-10-CM

## 2022-04-09 DIAGNOSIS — R0789 Other chest pain: Principal | ICD-10-CM

## 2022-04-09 DIAGNOSIS — M170 Bilateral primary osteoarthritis of knee: Principal | ICD-10-CM

## 2022-04-09 DIAGNOSIS — R350 Frequency of micturition: Principal | ICD-10-CM

## 2022-04-09 DIAGNOSIS — Z1231 Encounter for screening mammogram for malignant neoplasm of breast: Principal | ICD-10-CM

## 2022-04-09 DIAGNOSIS — N760 Acute vaginitis: Principal | ICD-10-CM

## 2022-04-09 DIAGNOSIS — J301 Allergic rhinitis due to pollen: Principal | ICD-10-CM

## 2022-04-09 LAB — CBC W/ AUTO DIFF
BASOPHILS ABSOLUTE COUNT: 0.1 10*9/L (ref 0.0–0.1)
BASOPHILS RELATIVE PERCENT: 1 %
EOSINOPHILS ABSOLUTE COUNT: 0.2 10*9/L (ref 0.0–0.5)
EOSINOPHILS RELATIVE PERCENT: 2.6 %
HEMATOCRIT: 41.2 % (ref 34.0–44.0)
HEMOGLOBIN: 13.6 g/dL (ref 11.3–14.9)
LYMPHOCYTES ABSOLUTE COUNT: 2.1 10*9/L (ref 1.1–3.6)
LYMPHOCYTES RELATIVE PERCENT: 28.3 %
MEAN CORPUSCULAR HEMOGLOBIN CONC: 32.9 g/dL (ref 32.0–36.0)
MEAN CORPUSCULAR HEMOGLOBIN: 30.4 pg (ref 25.9–32.4)
MEAN CORPUSCULAR VOLUME: 92.3 fL (ref 77.6–95.7)
MEAN PLATELET VOLUME: 10.6 fL (ref 6.8–10.7)
MONOCYTES ABSOLUTE COUNT: 0.7 10*9/L (ref 0.3–0.8)
MONOCYTES RELATIVE PERCENT: 9.2 %
NEUTROPHILS ABSOLUTE COUNT: 4.4 10*9/L (ref 1.8–7.8)
NEUTROPHILS RELATIVE PERCENT: 58.9 %
PLATELET COUNT: 226 10*9/L (ref 150–450)
RED BLOOD CELL COUNT: 4.47 10*12/L (ref 3.95–5.13)
RED CELL DISTRIBUTION WIDTH: 13.2 % (ref 12.2–15.2)
WBC ADJUSTED: 7.4 10*9/L (ref 3.6–11.2)

## 2022-04-09 MED ORDER — ALBUTEROL SULFATE HFA 90 MCG/ACTUATION AEROSOL INHALER
RESPIRATORY_TRACT | 0 refills | 0 days | Status: CP | PRN
Start: 2022-04-09 — End: 2022-07-08

## 2022-04-09 MED ORDER — AMOXICILLIN 875 MG-POTASSIUM CLAVULANATE 125 MG TABLET
ORAL_TABLET | Freq: Two times a day (BID) | ORAL | 0 refills | 10 days | Status: CP
Start: 2022-04-09 — End: 2022-04-19

## 2022-04-09 MED ORDER — FLUCONAZOLE 150 MG TABLET
ORAL_TABLET | Freq: Once | ORAL | 0 refills | 1 days | Status: CP
Start: 2022-04-09 — End: 2022-04-09

## 2022-04-09 MED ADMIN — sodium hyaluronate (viscosup) (GELSYN-3) 16.8 mg/2 mL injection 16.8 mg: 16.8 mg | INTRA_ARTICULAR | @ 13:00:00 | Stop: 2022-04-09

## 2022-04-09 NOTE — Unmapped (Signed)
Name:  Natalie [name]  DOB: Feb 14, 1970  Today's Date: 04/09/2022  Age:  52 y.o.    Assessment/Plan:      Natalie [name] was seen today for abdominal pain and urinary frequency.    Diagnoses and all orders for this visit:    Encounter for screening mammogram for malignant neoplasm of breast  -     Mammo Digital Screening W Tomo Bilateral W CAD; Future    Chest tightness  -     albuterol HFA 90 mcg/actuation inhaler; Inhale 2 puffs every four (4) hours as needed for wheezing.    Seasonal allergic rhinitis due to pollen  -     albuterol HFA 90 mcg/actuation inhaler; Inhale 2 puffs every four (4) hours as needed for wheezing.    Urinary frequency  -     Urine Culture; Future  -     POCT urinalysis dipstick; Future    LLQ abdominal pain  -     CBC w/ Differential; Future    She is advised to be referred to urology for further evaluation of persistent microscopic hematuria.  Maintain adequate water intake.    Continue the course, and complete the course of tapering prednisone therapy.  She is advised to get x-ray of lumbar spine, to assess for lumbar disc disease, as she was having left-sided lumbar radiculopathy symptoms.  Will consider physical therapy.      Diagnosis and plan along with any newly prescribed medication(s) were discussed in detail with this patient today. The patient verbalized understanding and agreed with the plan without language barriers or behavioral barriers to understanding unless otherwise noted.    Subjective:      HPI: Natalie [name] is a 52 y.o. female is here for    Chief Complaint   Patient presents with    Abdominal Pain     Still ongoing just moving from the back left side above kidney now moved to the front left lower quadrant.     Urinary Frequency     03/29/2022:    On 03/22/2022, on a walk-in visit with Natalie [name], she was found to have indeterminate 4 mm calcification in her right-hemipelvis, which could be distal ureter calculus, phlebolith or material within the fecal stream. She was thought to have kidney stone, and was prescribed tamsulosin.    She saw me virtually on 03/25/2022, with complaints of persisting left-sided abdominal pain, and numbness in left leg.  On 03/25/2022, I have ordered a CT scan of abdomen for persisting left-sided abdominal pain, and sent the urine for urinalysis.  I have started her on tapering dose of prednisone, for lumbar radiculopathy causing left-sided leg numbness.  Urinalysis was abnormal, and so urine culture added, and she was treated empirically with nitrofurantoin.    Later that evening, as pain was bothering her a lot, she went to the emergency department at [facility].  Per CT scan of abdomen, No acute abnormality identified in the abdomen or pelvis. 1.2 cm hyperdensity in the transverse colon could reflect ingested material.  At the ED, urinalysis did not reveal any findings suggestive of UTI.  Pelvic swabs revealed that she had bacterial vaginosis.  So, metronidazole was called in.    Apparently, she has completed nitrofurantoin twice daily for 3 days, and discontinued, as she found that she did not have urinary infection.  She has been taking metronidazole, for bacterial vaginosis.  Apparently, left-sided abdominal pain has much improved, and is down to 3/10 in intensity.  The numbness  in left leg has much improved, and only occasional now.  She has been taking prednisone treatment, which was prescribed by me on 03/25/2022.    She has been found to have blood and red blood cells on repeated urine testing over the last few months.    04/09/2022:   She has been having left sided lower quadrant pain. It was more in the left lower back / left flank, and now shifted to anterolateral abdomen.  It has been persistent, 4-5 / 10 in severity at rest, and could go upto 9/10., when severe.  It limits her ability to do some activities.  Dicyclomine PRN provides some help.  She doesn't have any constipation or diarrhea, fever or chills.  She had a CT scan on 04/01/22.    She has increased urinary frequency. No dysuria / odor to urine. It has been hazy and yellowish in color.    Seasonal allergies bother her:  stuffy nose / runny nose / itchy eyes or skin, and chest tightness and wheezing. Needs albuterol inhaler 2 times a week or so.      CT Abdomen / pelvis on 04/01/2022: LIVER: Hypodense steatotic liver.  No other abnormal findings.  KIDNEYS/URETERS:  Unremarkable except for a subcentimeter right renal hypodensity, too small to adequately characterize, statistically probably cyst.  LYMPH NODES:  No adenopathy.  BOWEL/MESENTERY: Mild ascending, descending and sigmoid colonic uncomplicated diverticulosis.    PELVIC ORGANS:  No pelvic mass status post hysterectomy.  No abscess, adenopathy or inflammatory change.  BONES/SOFT TISSUES:  L5-S1 posterior fusion changes with intact hardware.  Severe L4-5 degenerative changes.  No suspicious bony lesions.       Past Medical/Surgical History:     Past Medical History:   Diagnosis Date    Allergic conjunctivitis 11/23/2018    Anemia 1986    Arthritis     Breast mass     Chondromalacia of left knee     Chondromalacia of right knee     Depressive disorder     Fibrocystic breast     GERD (gastroesophageal reflux disease)     Hematuria     History of kidney stones     History of transfusion     Hyperlipidemia 1999    Hypothyroidism     Lumbar disc disease 1998    She was injured at work at the age of 40 and then had disk surgery 2 years later     Menopause ovarian failure     Menopause, premature     Migraine with aura     Neuromuscular disorder (CMS-HCC) 1999    Obesity (BMI 30-39.9) 11/23/2018    Ovarian cyst     Plantar fasciitis     Pseudotumor cerebri     Rash due to allergy 11/23/2018    Sickle cell trait (CMS-HCC)     Urinary tract infection     Vaginitis      Past Surgical History:   Procedure Laterality Date    BACK SURGERY  2001    BREAST BIOPSY Bilateral     When patient was 16 & 17 Both Negative    HYSTERECTOMY  2008    PR REMOVAL OF TONSILS,13+ Y/O Bilateral 10/10/2019    Procedure: TONSILLECTOMY;  Surgeon: Lona Millard, MD;  Location: OR [place];  Service: ENT    SALPINGOOPHORECTOMY Bilateral 2007    SPINE SURGERY  2001    TOTAL VAGINAL HYSTERECTOMY  01/04/2006    Uterine Prolapse-Dr. Leeanne Rio OP Note  TUBAL LIGATION  1995       Family History:     Family History   Problem Relation Age of Onset    Heart attack Father     Mental illness Father     Asthma Father     Hypertension Mother     Heart disease Mother     Arthritis Mother     Depression Mother     Drug abuse Mother     Mental illness Mother     Ulcers Mother     COPD Mother     Heart attack Maternal Grandmother     Heart disease Maternal Grandmother     Hypertension Maternal Grandmother     Thyroid disease Maternal Grandmother     Breast cancer Cousin 67    Cancer Sister         lymphoma    COPD Maternal Aunt     Depression Son     Drug abuse Son     Mental illness Son     Stroke Paternal Grandfather     Thyroid disease Other     Alcohol abuse Neg Hx        Social History:     Social History     Socioeconomic History    Marital status: Single     Spouse name: None    Number of children: 2    Years of education: 15    Highest education level: None   Occupational History     Employer: NOT EMPLOYED   Tobacco Use    Smoking status: Never     Passive exposure: Past    Smokeless tobacco: Never   Vaping Use    Vaping status: Never Used   Substance and Sexual Activity    Alcohol use: Never    Drug use: Never    Sexual activity: Yes     Partners: Male     Birth control/protection: Post-menopausal, Surgical     Comment: steady partner for 4 yrs, as on 04/03/19.   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, agricultural)     Country of Origin - [place]                      Social Determinants of Health     Financial Resource Strain: Low Risk  (03/22/2022)    Overall Financial Resource Strain (CARDIA)     Difficulty of Paying Living Expenses: Not hard at all   Food Insecurity: No Food Insecurity (03/22/2022)    Hunger Vital Sign     Worried About Running Out of Food in the Last Year: Never true     Ran Out of Food in the Last Year: Never true   Transportation Needs: No Transportation Needs (03/22/2022)    PRAPARE - Therapist, art (Medical): No     Lack of Transportation (Non-Medical): No       Allergies:     Buprenorphine hcl, Pregabalin, Adhesive tape-silicones, Doxycycline hyclate (bulk), Keflex [cephalexin], Opioids - morphine analogues, and  Oxycodone-acetaminophen    Current Medications:     Current Outpatient Medications   Medication Sig Dispense Refill    azelastine (ASTELIN) 137 mcg (0.1 %) nasal spray 2 sprays into each nostril Two (2) times a day. 30 mL 12    azelastine (OPTIVAR) 0.05 % ophthalmic solution Administer 2 drops to both eyes daily as needed. 6 mL 5    baclofen (LIORESAL) 20 MG tablet 10 - 20 mg three times a day as needed for spasms. 90 tablet 2    celecoxib (CELEBREX) 200 MG capsule Take 1 capsule (200 mg total) by mouth daily. 30 capsule 0    cetirizine (ZYRTEC) 10 MG chewable tablet Chew 1 tablet (10 mg total) at bedtime as needed for allergies.      diclofenac sodium (PENNSAID) 20 mg/gram /actuation(2 %) sopm Apply 2 pumps to affected area up to four times a day. 112 g 2    dicyclomine (BENTYL) 20 mg tablet Take 1 tablet (20 mg total) by mouth two (2) times a day for 10 days. 20 tablet 0    empty container Misc Use as directed to dispose of Xolair syringes 1 each 2    EPINEPHrine (EPIPEN) 0.3 mg/0.3 mL injection Inject 0.3 mL (0.3 mg total) into the muscle once as needed for anaphylaxis (Difficulty breathing, throat closing, etc) for up to 1 dose. 1 each 12    estradioL (ESTRACE) 0.5 MG tablet Take 1 tablet (0.5 mg total) by mouth daily. 30 tablet 1    famotidine (PEPCID) 20 MG tablet Take 1 tablet (20 mg total) by mouth two (2) times a day.      gabapentin (NEURONTIN) 100 MG capsule Take 1 capsule (100 mg total) by mouth in the morning. 90 capsule 0    gabapentin (NEURONTIN) 800 MG tablet Take 1 tablet (800 mg total) by mouth two (2) times a day. (Patient taking differently: Take 1 tablet (800 mg total) by mouth daily.) 180 tablet 0    hydroCHLOROthiazide (HYDRODIURIL) 12.5 MG tablet TAKE 1 TABLET(12.5 MG) BY MOUTH DAILY AS NEEDED FOR SWELLING 30 tablet 5    montelukast (SINGULAIR) 10 mg tablet Take 1 tablet (10 mg total) by mouth nightly. 30 tablet 2    naloxone (NARCAN) 4 mg nasal spray One spray in either nostril once for known/suspected opioid overdose. May repeat every 2-3 minutes in alternating nostril til EMS arrives 1 each 0    nortriptyline (PAMELOR) 25 MG capsule Take 1 capsule (25 mg total) by mouth nightly. 90 capsule 1    olopatadine (PATANOL) 0.1 % ophthalmic solution Administer 1 drop to both eyes Two (2) times a day. 5 mL 1    omalizumab (XOLAIR) 150 mg/mL syringe Inject the contents of 2 syringes (300 mg total) under the skin every twenty-eight (28) days. 2 mL 11    [START ON 05/07/2022] oxyCODONE-acetaminophen (PERCOCET) 10-325 mg per tablet Take 1 tablet by mouth every six (6) hours as needed for pain. Max 4 tabs/day. Fill on or after: 05/07/22. Brand name only due to allergy. 120 tablet 0    [START ON 06/06/2022] oxyCODONE-acetaminophen (PERCOCET) 10-325 mg per tablet Take 1 tablet by mouth every six (6) hours as needed for pain. Max 4 tabs/day. Fill on or after: 06/06/22. Brand name only due to allergy. 120 tablet 0    [START ON 07/06/2022] oxyCODONE-acetaminophen (PERCOCET) 10-325 mg per tablet Take 1 tablet by mouth every four (4) hours as needed for pain. Max 4 tabs/day. Fill on or after: 07/06/22. Brand  name only due to allergy. 120 tablet 0    pantoprazole (PROTONIX) 40 MG tablet Take 1 tablet (40 mg total) by mouth daily as needed (take it 30 mins prior to breakfast). 30 tablet 3    potassium chloride 20 MEQ CR tablet Take 1 tablet (20 mEq total) by mouth daily. 90 tablet 2    PROCHAMBER Spcr       rizatriptan (MAXALT-MLT) 10 MG disintegrating tablet Take 1 tablet by mouth at ONSET of migraine or aura. May repeat in 2 HOURS if needed. DO NOT EXCEED 2 doses/day, 2 days/week, 8 doses/month. 10 tablet 5    rosuvastatin (CRESTOR) 10 MG tablet TAKE 1 TABLET(10 MG) BY MOUTH EVERY NIGHT 90 tablet 0    triamcinolone (KENALOG) 0.1 % cream Apply 1 application topically two (2) times a day as needed. WHEN ALLERGIES FLARE-UP AND CAUSES RASH / ITCHING 80 g 0    XHANCE 93 mcg/actuation AerB 1 spray into each nostril two (2) times a day as needed. 16 mL 11    albuterol HFA 90 mcg/actuation inhaler Inhale 2 puffs every four (4) hours as needed for wheezing. 54 g 0     Current Facility-Administered Medications   Medication Dose Route Frequency Provider Last Rate Last Admin    omalizumab Geoffry Paradise) injection 300 mg  300 mg Subcutaneous Q28 Days Volertas, Sofija Dalia, MD   300 mg at 03/09/22 1441       ROS:     Review of Systems    Vital Signs:     Wt Readings from Last 3 Encounters:   04/09/22 75.8 kg (167 lb 3.2 oz)   04/09/22 75.8 kg (167 lb)   04/08/22 76 kg (167 lb 8 oz)     Temp Readings from Last 3 Encounters:   04/09/22 36.6 ??C (97.9 ??F)   04/08/22 36.7 ??C (98.1 ??F) (Skin)   04/01/22 36.4 ??C (97.5 ??F) (Oral)     BP Readings from Last 3 Encounters:   04/09/22 124/72   04/08/22 118/86   04/02/22 134/84     Pulse Readings from Last 3 Encounters:   04/09/22 82   04/08/22 85   04/01/22 70     Body mass index is 31.61 kg/m??.    Objective:      Physical Exam  Nursing note reviewed.      Patient couldn't be examined, as this was a phone visit.       Labs:     No results found for this visit on 04/09/22.    Royal Hawthorn, MD  04/09/2022      {    Coding tips - Do not edit this text, it will delete upon signing of note!    Telephone visits [phone] for Physicians and APPs and [phone] for Non- Physician Clinicians)- Only use minutes on the phone to determine level of service.    Video visits [phone]) - Use either level of medical decision making just as an in-person visit OR time which includes both minutes on video and pre/post minutes to determine the level of service.      :[id number]}  The patient reports they are physically located in [place] and is currently: at home. I conducted a phone visit.  I spent 12 minutes on the phone call with the patient on the date of service . Negative    pH, UA 7.0 5.0 - 9.0    Protein, UA Negative Negative    Urobilinogen, UA Normal Negative (0.2 mg/dL)  Leukocytes, UA 25 mg/dL Negative    Nitrite, UA Negative Negative    STRIP LOT NUMBER [id number]     STRIP LOT EXPIRATION [phone]        Royal Hawthorn, MD  04/11/2022

## 2022-04-09 NOTE — Unmapped (Unsigned)
Abdomen pain was in the back above kidney but now its moved to the front.

## 2022-04-12 NOTE — Unmapped (Signed)
Natalie [name] (Key: [id number])  PA Case ID #: [id number]  Need Help? Call us at [phone]  Status  Sent to Plan today  Drug  Percocet 10-325MG  tablets    Form  [employer] Electronic PA Form

## 2022-04-13 NOTE — Unmapped (Signed)
Natalie [name] (Key: [id number])  PA Case ID #: [id number]  Need Help? Call us at [phone]  Outcome  Approved on March 18  PA Case: [id number], Status: Approved, Coverage Starts on: 01/25/2022 12:00:00 AM, Coverage Ends on: 01/25/2023 12:00:00 AM. Questions? Contact [phone].  Authorization Expiration Date: 01/24/2023  Drug  Percocet 10-325MG  tablets    Form  [employer] Electronic PA Form

## 2022-04-14 ENCOUNTER — Telehealth: Admit: 2022-04-14 | Payer: MEDICARE

## 2022-04-14 DIAGNOSIS — N951 Menopausal and female climacteric states: Principal | ICD-10-CM

## 2022-04-14 MED ORDER — DICYCLOMINE 20 MG TABLET
ORAL_TABLET | Freq: Three times a day (TID) | ORAL | 0 refills | 10 days | Status: CP | PRN
Start: 2022-04-14 — End: 2022-04-24

## 2022-04-14 MED ORDER — ESTRADIOL 0.5 MG TABLET
ORAL_TABLET | Freq: Every day | ORAL | 1 refills | 30 days | Status: CP
Start: 2022-04-14 — End: ?

## 2022-04-14 NOTE — Unmapped (Signed)
Name:  Natalie [name]  DOB: 1970-05-15  Today's Date: 04/14/2022  Age:  52 y.o.    Assessment/Plan:      Natalie [name] was seen today for referral setup.    Diagnoses and all orders for this visit:    LLQ abdominal pain  -     dicyclomine (BENTYL) 20 mg tablet; Take 1 tablet (20 mg total) by mouth Three (3) times a day as needed for up to 10 days.    Vaginal dryness, menopausal  -     estradiol (ESTRACE) 0.5 MG tablet; Take 1 tablet (0.5 mg total) by mouth daily.  -     Gynecology; Future    Hot flashes due to menopause  -     estradiol (ESTRACE) 0.5 MG tablet; Take 1 tablet (0.5 mg total) by mouth daily.  -     Gynecology; Future    She already had evaluation of her abdominal pain with CT scan.  With no other cause found, she was recently started on empiric treatment for acute diverticulitis.  Advised to continue and complete the course of Augmentin prescribed at recent visit.  Apparently, dicyclomine was helpful for her after recent ER visit.  So, I have will refill it.    Refilled estrogen tablet.  Placed a new referral to GYN.    She was already referred to urology for further evaluation of persistent microscopic hematuria.  She believes she has an appointment with them on 04/16/2022.  Maintain adequate water intake.    Diagnosis and plan along with any newly prescribed medication(s) were discussed in detail with this patient today. The patient verbalized understanding and agreed with the plan without language barriers or behavioral barriers to understanding unless otherwise noted.    Subjective:      HPI: Natalie [name] is a 52 y.o. female is here for    Chief Complaint   Patient presents with    Referral Setup     Pain in side all day trying to figure out what this is from she has not heard from GYN  Needs a referral to GYN and Endo to cover all her bases and why she is having this pain     03/29/2022:    On 03/22/2022, on a walk-in visit with Ms. [name], she was found to have indeterminate 4 mm calcification in her right-hemipelvis, which could be distal ureter calculus, phlebolith or material within the fecal stream.  She was thought to have kidney stone, and was prescribed tamsulosin.    She saw me virtually on 03/25/2022, with complaints of persisting left-sided abdominal pain, and numbness in left leg.  On 03/25/2022, I have ordered a CT scan of abdomen for persisting left-sided abdominal pain, and sent the urine for urinalysis.  I have started her on tapering dose of prednisone, for lumbar radiculopathy causing left-sided leg numbness.  Urinalysis was abnormal, and so urine culture added, and she was treated empirically with nitrofurantoin.    Later that evening, as pain was bothering her a lot, she went to the emergency department at [facility].  Per CT scan of abdomen, No acute abnormality identified in the abdomen or pelvis. 1.2 cm hyperdensity in the transverse colon could reflect ingested material.  At the ED, urinalysis did not reveal any findings suggestive of UTI.  Pelvic swabs revealed that she had bacterial vaginosis.  So, metronidazole was called in.    Apparently, she has completed nitrofurantoin twice daily for 3 days, and discontinued, as she found that  she did not have urinary infection.  She has been taking metronidazole, for bacterial vaginosis.  Apparently, left-sided abdominal pain has much improved, and is down to 3/10 in intensity.  The numbness in left leg has much improved, and only occasional now.  She has been taking prednisone treatment, which was prescribed by me on 03/25/2022.    She has been found to have blood and red blood cells on repeated urine testing over the last few months.    04/09/2022:   She has been having left sided lower quadrant pain. It was more in the left lower back / left flank, and now located in LLQ region of abdomen.  It has been persistent, 4-5 / 10 in severity at rest, and could go upto 9/10, when severe.  It limits her ability to do some activities.  Dicyclomine PRN provides some help.  She doesn't have any constipation or diarrhea, fever or chills.  She had a CT scan on 04/01/22, and been found to have diverticulosis.    She has increased urinary frequency. No dysuria / odor to urine. It has been hazy and yellowish in color.    Seasonal allergies bother her:  stuffy nose / runny nose / itchy eyes or skin, and chest tightness and wheezing. Needs albuterol inhaler 2 times a week or so for chest tightness / wheezing.      CT Abdomen / pelvis on 04/01/2022: LIVER: Hypodense steatotic liver.  No other abnormal findings.  KIDNEYS/URETERS:  Unremarkable except for a subcentimeter right renal hypodensity, too small to adequately characterize, statistically probably cyst.  LYMPH NODES:  No adenopathy.  BOWEL/MESENTERY: Mild ascending, descending and sigmoid colonic uncomplicated diverticulosis.    PELVIC ORGANS:  No pelvic mass status post hysterectomy.  No abscess, adenopathy or inflammatory change.  BONES/SOFT TISSUES:  L5-S1 posterior fusion changes with intact hardware.  Severe L4-5 degenerative changes.  No suspicious bony lesions.    04/14/2022:  She complains of having occasional episodes of left-sided lower abdominal pain.  She denies any fever, nausea, vomiting, diarrhea or constipation.  She has been taking Augmentin, as prescribed on 04/09/2022, for suspected acute diverticulitis.    She was recently referred to urology [facility], for persistent microhematuria.  She believes she has an appointment with them coming up on 04/16/2022.    She complains of having problems with menopausal symptoms, hot flashes and vaginal dryness.  Estrogen 0.5 mg tablet has been helpful, but she has been out of it.  Apparently, since she ran out of it, her pressures have been severe.  She denies any vaginal spotting.  She is s/p hysterectomy.  She was referred to gynecologist in late June 2023, but apparently, she has not heard from them.  She requests referral to a new gynecologist.    She has had migraine headaches, and chronic headaches.  She has appointment coming up on April 26, 2022 with neurology.           Past Medical/Surgical History:     Past Medical History:   Diagnosis Date    Allergic conjunctivitis 11/23/2018    Anemia 1986    Arthritis     Breast mass     Chondromalacia of left knee     Chondromalacia of right knee     Depressive disorder     Fibrocystic breast     GERD (gastroesophageal reflux disease)     Hematuria     History of kidney stones     History  of transfusion     Hyperlipidemia 1999    Hypothyroidism     Lumbar disc disease 1998    She was injured at work at the age of 14 and then had disk surgery 2 years later     Menopause ovarian failure     Menopause, premature     Migraine with aura     Neuromuscular disorder (CMS-HCC) 1999    Obesity (BMI 30-39.9) 11/23/2018    Ovarian cyst     Plantar fasciitis     Pseudotumor cerebri     Rash due to allergy 11/23/2018    Sickle cell trait (CMS-HCC)     Urinary tract infection     Vaginitis      Past Surgical History:   Procedure Laterality Date    BACK SURGERY  2001    BREAST BIOPSY Bilateral     When patient was 16 & 17 Both Negative    HYSTERECTOMY  2008    PR REMOVAL OF TONSILS,13+ Y/O Bilateral 10/10/2019    Procedure: TONSILLECTOMY;  Surgeon: Lona Millard, MD;  Location: OR [place];  Service: ENT    SALPINGOOPHORECTOMY Bilateral 2007    SPINE SURGERY  2001    TOTAL VAGINAL HYSTERECTOMY  01/04/2006    Uterine Prolapse-Dr. Leeanne Rio OP Note    TUBAL LIGATION  1995       Family History:     Family History   Problem Relation Age of Onset    Heart attack Father     Mental illness Father     Asthma Father     Hypertension Mother     Heart disease Mother     Arthritis Mother     Depression Mother     Drug abuse Mother     Mental illness Mother     Ulcers Mother     COPD Mother     Heart attack Maternal Grandmother     Heart disease Maternal Grandmother     Hypertension Maternal Grandmother     Thyroid disease Maternal Grandmother     Breast cancer Cousin 67    Cancer Sister         lymphoma    COPD Maternal Aunt     Depression Son     Drug abuse Son     Mental illness Son     Stroke Paternal Grandfather     Thyroid disease Other     Alcohol abuse Neg Hx        Social History:     Social History     Socioeconomic History    Marital status: Single     Spouse name: None    Number of children: 2    Years of education: 15    Highest education level: None   Occupational History     Employer: NOT EMPLOYED   Tobacco Use    Smoking status: Never     Passive exposure: Past    Smokeless tobacco: Never   Vaping Use    Vaping status: Never Used   Substance and Sexual Activity    Alcohol use: Never    Drug use: Never    Sexual activity: Yes     Partners: Male     Birth control/protection: Post-menopausal, Surgical     Comment: steady partner for 4 yrs, as on 04/03/19.   Social History Narrative    Marital Status - Single     Children - Son(s) [2]    Pets - Dog (1)  Turtle (1)     Household - Lives with sons and grandson Ephriam Knuckles)     Occupation - Disabled since 2002    Tobacco/Alcohol/Drug Use - Denies      Diet - Regular    Exercise/Sports - Walking     Hobbies - Movies     Education - [employer] (Engineer, agricultural)     Country of Origin - [place]                      Social Determinants of Health     Financial Resource Strain: Low Risk  (03/22/2022)    Overall Financial Resource Strain (CARDIA)     Difficulty of Paying Living Expenses: Not hard at all   Food Insecurity: No Food Insecurity (03/22/2022)    Hunger Vital Sign     Worried About Running Out of Food in the Last Year: Never true     Ran Out of Food in the Last Year: Never true   Transportation Needs: No Transportation Needs (03/22/2022)    PRAPARE - Therapist, art (Medical): No     Lack of Transportation (Non-Medical): No       Allergies:     Buprenorphine hcl, Pregabalin, Adhesive tape-silicones, Doxycycline hyclate (bulk), Keflex [cephalexin], Opioids - morphine analogues, and Oxycodone-acetaminophen    Current Medications:     Current Outpatient Medications   Medication Sig Dispense Refill    albuterol HFA 90 mcg/actuation inhaler Inhale 2 puffs every four (4) hours as needed for wheezing. 54 g 0    amoxicillin-clavulanate (AUGMENTIN) 875-125 mg per tablet Take 1 tablet by mouth two (2) times a day for 10 days. 20 tablet 0    azelastine (ASTELIN) 137 mcg (0.1 %) nasal spray 2 sprays into each nostril Two (2) times a day. 30 mL 12    azelastine (OPTIVAR) 0.05 % ophthalmic solution Administer 2 drops to both eyes daily as needed. 6 mL 5    baclofen (LIORESAL) 20 MG tablet 10 - 20 mg three times a day as needed for spasms. 90 tablet 2    celecoxib (CELEBREX) 200 MG capsule Take 1 capsule (200 mg total) by mouth daily. 30 capsule 0    cetirizine (ZYRTEC) 10 MG chewable tablet Chew 1 tablet (10 mg total) at bedtime as needed for allergies.      diclofenac sodium (PENNSAID) 20 mg/gram /actuation(2 %) sopm Apply 2 pumps to affected area up to four times a day. 112 g 2    dicyclomine (BENTYL) 20 mg tablet Take 1 tablet (20 mg total) by mouth Three (3) times a day as needed for up to 10 days. 30 tablet 0    empty container Misc Use as directed to dispose of Xolair syringes 1 each 2    EPINEPHrine (EPIPEN) 0.3 mg/0.3 mL injection Inject 0.3 mL (0.3 mg total) into the muscle once as needed for anaphylaxis (Difficulty breathing, throat closing, etc) for up to 1 dose. 1 each 12    estradiol (ESTRACE) 0.5 MG tablet Take 1 tablet (0.5 mg total) by mouth daily. 30 tablet 1    famotidine (PEPCID) 20 MG tablet Take 1 tablet (20 mg total) by mouth two (2) times a day.      gabapentin (NEURONTIN) 100 MG capsule Take 1 capsule (100 mg total) by mouth in the morning. 90 capsule 0    gabapentin (NEURONTIN) 800 MG tablet Take 1 tablet (800 mg total) by mouth two (2)  times a day. (Patient taking differently: Take 1 tablet (800 mg total) by mouth daily.) 180 tablet 0    hydroCHLOROthiazide (HYDRODIURIL) 12.5 MG tablet TAKE 1 TABLET(12.5 MG) BY MOUTH DAILY AS NEEDED FOR SWELLING 30 tablet 5    montelukast (SINGULAIR) 10 mg tablet Take 1 tablet (10 mg total) by mouth nightly. 30 tablet 2    naloxone (NARCAN) 4 mg nasal spray One spray in either nostril once for known/suspected opioid overdose. May repeat every 2-3 minutes in alternating nostril til EMS arrives 1 each 0    nortriptyline (PAMELOR) 25 MG capsule Take 1 capsule (25 mg total) by mouth nightly. 90 capsule 1    olopatadine (PATANOL) 0.1 % ophthalmic solution Administer 1 drop to both eyes Two (2) times a day. 5 mL 1    omalizumab (XOLAIR) 150 mg/mL syringe Inject the contents of 2 syringes (300 mg total) under the skin every twenty-eight (28) days. 2 mL 11    [START ON 05/07/2022] oxyCODONE-acetaminophen (PERCOCET) 10-325 mg per tablet Take 1 tablet by mouth every six (6) hours as needed for pain. Max 4 tabs/day. Fill on or after: 05/07/22. Brand name only due to allergy. 120 tablet 0    [START ON 06/06/2022] oxyCODONE-acetaminophen (PERCOCET) 10-325 mg per tablet Take 1 tablet by mouth every six (6) hours as needed for pain. Max 4 tabs/day. Fill on or after: 06/06/22. Brand name only due to allergy. 120 tablet 0    [START ON 07/06/2022] oxyCODONE-acetaminophen (PERCOCET) 10-325 mg per tablet Take 1 tablet by mouth every four (4) hours as needed for pain. Max 4 tabs/day. Fill on or after: 07/06/22. Brand name only due to allergy. 120 tablet 0    pantoprazole (PROTONIX) 40 MG tablet Take 1 tablet (40 mg total) by mouth daily as needed (take it 30 mins prior to breakfast). 30 tablet 3    potassium chloride 20 MEQ CR tablet Take 1 tablet (20 mEq total) by mouth daily. 90 tablet 2    PROCHAMBER Spcr       rizatriptan (MAXALT-MLT) 10 MG disintegrating tablet Take 1 tablet by mouth at ONSET of migraine or aura. May repeat in 2 HOURS if needed. DO NOT EXCEED 2 doses/day, 2 days/week, 8 doses/month. 10 tablet 5    rosuvastatin (CRESTOR) 10 MG tablet TAKE 1 TABLET(10 MG) BY MOUTH EVERY NIGHT 90 tablet 0    triamcinolone (KENALOG) 0.1 % cream Apply 1 application topically two (2) times a day as needed. WHEN ALLERGIES FLARE-UP AND CAUSES RASH / ITCHING 80 g 0    XHANCE 93 mcg/actuation AerB 1 spray into each nostril two (2) times a day as needed. 16 mL 11     Current Facility-Administered Medications   Medication Dose Route Frequency Provider Last Rate Last Admin    omalizumab Geoffry Paradise) injection 300 mg  300 mg Subcutaneous Q28 Days Volertas, Sofija Dalia, MD   300 mg at 03/09/22 1441       ROS:     Review of Systems    Vital Signs:     Wt Readings from Last 3 Encounters:   04/09/22 75.8 kg (167 lb 3.2 oz)   04/09/22 75.8 kg (167 lb)   04/08/22 76 kg (167 lb 8 oz)     Temp Readings from Last 3 Encounters:   04/09/22 36.6 ??C (97.9 ??F)   04/08/22 36.7 ??C (98.1 ??F) (Skin)   04/01/22 36.4 ??C (97.5 ??F) (Oral)     BP Readings from Last 3 Encounters:   04/09/22  124/72   04/08/22 118/86   04/02/22 134/84     Pulse Readings from Last 3 Encounters:   04/09/22 82   04/08/22 85   04/01/22 70     There is no height or weight on file to calculate BMI.    Objective:      Physical Exam  Nursing note reviewed.      Patient couldn't be examined, as this was a phone visit.       Labs:     No results found for this visit on 04/14/22.      Royal Hawthorn, MD  04/14/2022    The patient reports they are physically located in [place] and is currently: at home. I conducted a phone visit.  I spent 8 minutes on the phone call with the patient on the date of service .

## 2022-04-15 MED ORDER — ESTRADIOL 0.5 MG TABLET
ORAL_TABLET | 0 refills | 0 days
Start: 2022-04-15 — End: ?

## 2022-04-21 NOTE — Unmapped (Signed)
[facility] Refill Coordination Note    Natalie [name], [place]: [birth year]  Phone: There are no phone numbers on file.      All above HIPAA information was verified with patient.         04/20/2022     6:06 PM   Specialty Rx Medication Refill Questionnaire   Which Medications would you like refilled and shipped? Zolair   Please list all current allergies: Not sure what all im allergic to other than grasses, weeds,  trees, pollen, pet dander, bug bites   Have you missed any doses in the last 30 days? No   Have you had any changes to your medication(s) since your last refill? No   How many days remaining of each medication do you have at home? 17   If receiving an injectable medication, next injection date is 05/08/2022   Have you experienced any side effects in the last 30 days? No   Please enter the full address (street address, city, state, zip code) where you would like your medication(s) to be delivered to. [address] [name] [address]   Please specify on which day you would like your medication(s) to arrive. Note: if you need your medication(s) within 3 days, please call the pharmacy to schedule your order at [phone]  05/05/2022   Has your insurance changed since your last refill? No   Would you like a pharmacist to call you to discuss your medication(s)? No   Do you require a signature for your package? (Note: if we are billing Medicare Part B or your order contains a controlled substance, we will require a signature) No         Completed refill call assessment today to schedule patient's medication shipment from the [facility] [phone]).  All relevant notes have been reviewed.       Confirmed patient received a Conservation officer, historic buildings and a Surveyor, mining with first shipment. The patient will receive a drug information handout for each medication shipped and additional FDA Medication Guides as required.         REFERRAL TO PHARMACIST     Referral to the pharmacist: Not needed      [facility]     Shipping address confirmed in Epic.     Delivery Scheduled: Yes, Expected medication delivery date: 05/05/22.     Medication will be delivered via Same Day Courier to the prescription address in Epic WAM.    Zivah Mayr' [name] Shared [facility] Specialty Technician

## 2022-04-28 NOTE — Unmapped (Addendum)
Trigger Point Injection of 3+ muscle groups: Bilateral cervical, thoracic and lumbar paraspinals, bilateral trapezius, rhomboids, and levator scapulae, bilateral quadratus and gluteus medius  Pre-operative diagnosis: Myofascial pain  Post-operative diagnosis: Same  Attending Physician: Dr. Criss Rosales  Assistant: Jerrell Mylar     Brief HPI:  The patient is a 52 y.o. female with past medical history of pseudotumor cerebri, bilateral knee osteoarthritis, myofascial pain syndrome, L5-S1 spinal fusion in 2001, coccydynia. She presents today for treatment of her myofascial paracervical, shoulders, upper mid and lower back areas with repeat trigger point injections    4/8-last TPI 2/15, overall doing OK. She underwent cervical MBBs with great results, has RFA scheduled next week.      Reports significant benefit from continuing on a regular TPI regimen. 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    Cervical Medial Branch Block #1 on 02/05/22: 85% for a couple of weeks, pain has now returned to baseline (TPI's only transiently help neck pain compared to the MBBs). MBB #2 was on 3/5 for 80% relief for 24 hours. Ablation is scheduled for next week.      Has serial consent from 03/11/2022     After risks and benefits were explained including bleeding, infection, worsening of the pain, damage to the area being injected, weakness, allergic reaction to medications, vascular injection, pneumothorax and nerve damage, signed consent was obtained.  All questions were answered.   The patient is not taking antiplatelet or anticoagulation medications and does not have a driver today.     The area of the trigger point was identified and the skin prepped with chlorhexidine.  Next, a 25 gauge 1.5 inch needle was placed in the area of the trigger points in the lumbar paraspinals and the gluteus medius; a 27 gauge 0.5 inch needle was used for everything else above.  Dry needling was performed in the area of the trigger point. Once reproduction of the pain was elicited and negative aspiration confirmed, the trigger point was injected with 0.5-1 mL of 0.25% bupivacaine and the needle removed.      The patient did tolerate the procedure well and there were not complications.       Trigger points injected: 25     Trigger point locations:  bilateral cervical, thoracic and lumbar paraspinals, bilateral trapezius, rhomboids, and levator scapulae, bilateral quadratus, and gluteus medius     The patient was monitored for 15 minutes after the procedure.  Vital signs remained normal and the patient ambulated out of clinic     Pre-procedure pain score: 6/10  Post-procedure pain score: 3/10     DISPO:  - Patient has an ablation scheduled for 4/16    Answers submitted by the patient for this visit:  Back Pain Questionnaire (Submitted on 05/03/2022)  Chief Complaint: Back pain  Chronicity: chronic  Onset: more than 1 year ago  Frequency: constantly  Progression since onset: unchanged  Pain location: gluteal, lumbar spine, sacro-iliac, thoracic spine  Pain quality: aching, burning, cramping, shooting, stabbing  Radiates to: left foot, left knee, left thigh, right foot  Pain - numeric: 9/10  Pain is: the same all the time  Aggravated by: bending, coughing, position, lying down, sitting, standing, twisting  Stiffness is present: all day  abdominal pain: No  bladder incontinence: No  bowel incontinence: No  chest pain: No  dysuria: No  fever: No  headaches: Yes  leg pain: Yes  numbness: Yes  paresis: No  paresthesias: Yes  pelvic pain:  No  perianal numbness: No  tingling: Yes  weakness: No  weight loss: No  Risk factors: menopause, obesity, poor posture

## 2022-04-30 ENCOUNTER — Telehealth: Admit: 2022-04-30 | Discharge: 2022-05-01 | Payer: MEDICARE | Attending: Psychologist | Primary: Psychologist

## 2022-04-30 DIAGNOSIS — F4321 Adjustment disorder with depressed mood: Principal | ICD-10-CM

## 2022-04-30 DIAGNOSIS — F1190 Opioid use, unspecified, uncomplicated: Principal | ICD-10-CM

## 2022-04-30 DIAGNOSIS — F419 Anxiety disorder, unspecified: Principal | ICD-10-CM
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
## 2022-04-30 NOTE — Unmapped (Signed)
Pre-procedure call made and RN spoke with patient    Recent infection/antibiotics. Denied    Are you diabetic? No Monitor Blood Glucose Daily? No    Taking any recent blood thinners/NSAIDs. No    Any electronic implants No    Pregnancy No    Driver necessary No    Patient informed to arrive 30 minutes before procedure appointment time.  Patient verbalized understanding to all.

## 2022-05-03 ENCOUNTER — Ambulatory Visit: Admit: 2022-05-03 | Discharge: 2022-05-04 | Payer: MEDICARE | Attending: Anesthesiology | Primary: Anesthesiology

## 2022-05-03 DIAGNOSIS — M7918 Myalgia, other site: Principal | ICD-10-CM

## 2022-05-03 MED ADMIN — bupivacaine HCl (MARCAINE) 0.25 % (2.5 mg/mL) injection 50 mg: 20 mL | @ 13:00:00 | Stop: 2022-05-03

## 2022-05-03 NOTE — Unmapped (Signed)
Addended by: Carolynne Edouard on: 05/03/2022 10:06 AM     Modules accepted: Orders

## 2022-05-03 NOTE — Unmapped (Signed)
AVS reviewed with patient and she verbalized understanding to all.

## 2022-05-03 NOTE — Unmapped (Signed)
Addended by: Jilda Roche A on: 05/03/2022 08:41 AM     Modules accepted: Orders

## 2022-05-03 NOTE — Unmapped (Signed)
POST PROCEDURE INSTRUCTIONS   You may apply an ice pack 20-30 minutes at a time to the injection site if you experience soreness.     Keep the injection site clean and dry. You make remove the band-aid one day following the procedure.     You may take a shower but AVOID getting in to baths, pools or whirlpools for 48 HOURS AFTER THE PROCEDURE.       ACTIVITY   Refrain from heavy activity for the next 24 to 48 hours. General walking is okay. You may resume your normal activities the day following the procedure.   You may start or resume your individualized exercise program or physical therapy 48 hours after the procedure.  IF YOU'VE HAD TRIGGER POINT INJECTIONS, YOU MAY CONTINUE EXERCISE OR PHYSICAL THERAPY WITHOUT DELAY.  MEDICATIONS   Please note that it is okay to continue other prescribed medications (blood pressure, insulin, water pill, depression/anxiety pill, etc.) as well as other prescribed pain medications such as Neurontin, Lyrical, Celebrex, Ultram, Vicodin, Norco and acetaminophen (Tylenol).   SIDE EFFECTS   Increase in pain during the first 24 to 48 hours.     You might experience:   1. Mild to moderate swelling at the joint.   2. Possible bruising at the injection site.     WHEN TO CALL THE DOCTOR/NURSE   Severe pain, worse or different that the pain you had before the procedure.     Fever or chills.     Redness, or swelling around the injection site.     Call the Pain Management  Procedural nurses [phone],  during normal business hours (7 am-3 pm). If it is AFTER HOURS or during a weekend or holiday, call the hospital operator and ask for the Anesthesia Pain physician on call at [phone].   FOR EMERGENCIES, CALL 911 OR GO TO THE NEAREST HOSPITAL EMERGENCY DEPARTMENT.   ?   TO SCHEDULE APPOINTMENTS OR FOR QUESTIONS RELATED TO MEDICATIONS   Call the Pain Management Clinic at [phone]

## 2022-05-04 DIAGNOSIS — N200 Calculus of kidney: Principal | ICD-10-CM

## 2022-05-04 MED ORDER — TAMSULOSIN 0.4 MG CAPSULE
ORAL_CAPSULE | 0 refills | 0 days
Start: 2022-05-04 — End: ?

## 2022-05-05 MED FILL — XOLAIR 150 MG/ML SUBCUTANEOUS SYRINGE: SUBCUTANEOUS | 28 days supply | Qty: 2 | Fill #2

## 2022-05-05 NOTE — Unmapped (Signed)
The original prescription was discontinued on 04/08/2022 by Lurline Del, [facility] for the following reason: No Longer Taking. Renewing this prescription may not be appropriate.     Unable to complete refill request,  medication was previously discontinued on 04/08/2022, and is being requested by the pharmacy..   Please review below request.     Patient is requesting the following refill  Requested Prescriptions     Pending Prescriptions Disp Refills    tamsulosin (FLOMAX) 0.4 mg capsule [Pharmacy Med Name: TAMSULOSIN 0.4MG  CAPSULES] 10 capsule 0     Sig: TAKE 1 CAPSULE(0.4 MG) BY MOUTH DAILY FOR 10 DAYS       Recent Visits  Date Type Provider Dept   04/09/22 Office Visit Neelagiri, Ardyth Man, MD [facility]   03/29/22 Telemedicine Neelagiri, Ardyth Man, MD [facility]   03/25/22 Office Visit Neelagiri, Ardyth Man, MD [facility]   03/22/22 Office Visit Karyl Kinnier, [place] [facility]   02/24/22 Telemedicine Neelagiri, Ardyth Man, MD [place] [facility]   01/20/22 Office Visit Neelagiri, Ardyth Man, MD [facility]   01/07/22 Telemedicine Karyl Kinnier, [place] [facility]   11/12/21 Telemedicine Neelagiri, Ardyth Man, MD [facility]   10/20/21 Office Visit Neelagiri, Ardyth Man, MD [facility]   08/13/21 Office Visit [name], Orson Eva, [place] [facility]   Showing recent visits within past 365 days with a meds authorizing provider and meeting all other requirements  Future Appointments  No visits were found meeting these conditions.  Showing future appointments within next 365 days with a meds authorizing provider and meeting all other requirements       Labs: Not applicable this refill

## 2022-05-06 MED ORDER — TAMSULOSIN 0.4 MG CAPSULE
ORAL_CAPSULE | 0 refills | 0 days
Start: 2022-05-06 — End: ?

## 2022-05-06 NOTE — Unmapped (Signed)
NOT APPROPRIATE. SHE WAS ALSO RULED OUT OF KIDNEY STONE

## 2022-05-07 ENCOUNTER — Ambulatory Visit: Admit: 2022-05-07 | Discharge: 2022-05-08 | Payer: MEDICARE

## 2022-05-07 ENCOUNTER — Telehealth: Admit: 2022-05-07 | Discharge: 2022-05-08 | Payer: MEDICARE

## 2022-05-07 DIAGNOSIS — R350 Frequency of micturition: Principal | ICD-10-CM

## 2022-05-07 LAB — URINALYSIS WITH MICROSCOPY
BACTERIA: NONE SEEN /HPF
BILIRUBIN UA: NEGATIVE
GLUCOSE UA: NEGATIVE
HYPHAL YEAST: NONE SEEN /HPF
KETONES UA: NEGATIVE
LEUKOCYTE ESTERASE UA: NEGATIVE
NITRITE UA: NEGATIVE
PH UA: 7 (ref 5.0–9.0)
PROTEIN UA: NEGATIVE
RBC UA: 5 /HPF — ABNORMAL HIGH (ref 0–3)
SPECIFIC GRAVITY UA: 1.02 (ref 1.005–1.030)
SQUAMOUS EPITHELIAL: 2 /HPF (ref 0–5)
UROBILINOGEN UA: <2
WBC CLUMPS: NONE SEEN /HPF
WBC UA: 1 /HPF (ref 0–2)
YEAST: NONE SEEN /HPF

## 2022-05-07 MED ORDER — OXYCODONE-ACETAMINOPHEN 10 MG-325 MG TABLET
ORAL_TABLET | Freq: Four times a day (QID) | ORAL | 0 refills | 30 days | Status: CP | PRN
Start: 2022-05-07 — End: 2022-06-06

## 2022-05-07 NOTE — Unmapped (Signed)
Name:  Natalie [name]  DOB: 1970-11-03  Today's Date: 05/07/2022  Age:  52 y.o.    Assessment/Plan:      Natalie [name] was seen today for urinary frequency.    Diagnoses and all orders for this visit:    Urinary frequency      She has provided urine sample for urinalysis, and will also send for culture and sensitivity.  Will await labs.  Continue adequate water intake.    Diagnosis and plan along with any newly prescribed medication(s) were discussed in detail with this patient today. The patient verbalized understanding and agreed with the plan without language barriers or behavioral barriers to understanding unless otherwise noted.    Subjective:      HPI: Natalie [name] is a 52 y.o. female is here for    Chief Complaint   Patient presents with    Urinary Frequency     Patient reports increased frequency and ain while voiding x few weeks     Symptom claims of having increased urinary frequency and pain when voiding for the last 3 weeks.  She denies any vaginal discharge, or spotting.  She has occasional left lower quadrant abdominal pain.  She denies any nausea, vomiting or diarrhea.    She has been referred to urologist for persistent microhematuria.  Apparently, she is scheduled for cystoscopy on 05/10/2022.     Past Medical/Surgical History:     Past Medical History:   Diagnosis Date    Allergic conjunctivitis 11/23/2018    Anemia 1986    Arthritis     Breast mass     Chondromalacia of left knee     Chondromalacia of right knee     Depressive disorder     Fibrocystic breast     GERD (gastroesophageal reflux disease)     Hematuria     History of kidney stones     History of transfusion     Hyperlipidemia 1999    Hypothyroidism     Lumbar disc disease 1998    She was injured at work at the age of 24 and then had disk surgery 2 years later     Menopause ovarian failure     Menopause, premature     Migraine with aura     Neuromuscular disorder (CMS-HCC) 1999    Obesity (BMI 30-39.9) 11/23/2018    Ovarian cyst Plantar fasciitis     Pseudotumor cerebri     Rash due to allergy 11/23/2018    Sickle cell trait (CMS-HCC)     Urinary tract infection     Vaginitis      Past Surgical History:   Procedure Laterality Date    BACK SURGERY  2001    BREAST BIOPSY Bilateral     When patient was 16 & 17 Both Negative    HYSTERECTOMY  2008    PR REMOVAL OF TONSILS,13+ Y/O Bilateral 10/10/2019    Procedure: TONSILLECTOMY;  Surgeon: Lona Millard, MD;  Location: OR [place];  Service: ENT    SALPINGOOPHORECTOMY Bilateral 2007    SPINE SURGERY  2001    TOTAL VAGINAL HYSTERECTOMY  01/04/2006    Uterine Prolapse-Dr. Leeanne Rio OP Note    TUBAL LIGATION  1995       Family History:     Family History   Problem Relation Age of Onset    Heart attack Father     Mental illness Father     Asthma Father     Hypertension Mother  Heart disease Mother     Arthritis Mother     Depression Mother     Drug abuse Mother     Mental illness Mother     Ulcers Mother     COPD Mother     Heart attack Maternal Grandmother     Heart disease Maternal Grandmother     Hypertension Maternal Grandmother     Thyroid disease Maternal Grandmother     Breast cancer Cousin 58    Cancer Sister         lymphoma    COPD Maternal Aunt     Depression Son     Drug abuse Son     Mental illness Son     Stroke Paternal Grandfather     Thyroid disease Other     Alcohol abuse Neg Hx        Social History:     Social History     Socioeconomic History    Marital status: Single     Spouse name: None    Number of children: 2    Years of education: 15    Highest education level: None   Occupational History     Employer: NOT EMPLOYED   Tobacco Use    Smoking status: Never     Passive exposure: Past    Smokeless tobacco: Never   Vaping Use    Vaping status: Never Used   Substance and Sexual Activity    Alcohol use: Never    Drug use: Never    Sexual activity: Yes     Partners: Male     Birth control/protection: Post-menopausal, Surgical     Comment: steady partner for 4 yrs, as on 04/03/19.   Social History Narrative    Marital Status - Single     Children - Son(s) [2]    Pets - Dog (1) Turtle (1)     Household - Lives with sons and grandson Ephriam Knuckles)     Occupation - Disabled since 2002    Tobacco/Alcohol/Drug Use - Denies      Diet - Regular    Exercise/Sports - Walking     Hobbies - Movies     Education - [employer] (Engineer, agricultural)     Country of Origin - [place]                      Social Determinants of Health     Financial Resource Strain: Low Risk  (03/22/2022)    Overall Financial Resource Strain (CARDIA)     Difficulty of Paying Living Expenses: Not hard at all   Food Insecurity: No Food Insecurity (03/22/2022)    Hunger Vital Sign     Worried About Running Out of Food in the Last Year: Never true     Ran Out of Food in the Last Year: Never true   Transportation Needs: No Transportation Needs (03/22/2022)    PRAPARE - Therapist, art (Medical): No     Lack of Transportation (Non-Medical): No       Allergies:     Buprenorphine hcl, Pregabalin, Adhesive tape-silicones, Doxycycline hyclate (bulk), Keflex [cephalexin], Opioids - morphine analogues, and Oxycodone-acetaminophen    Current Medications:     Current Outpatient Medications   Medication Sig Dispense Refill    albuterol HFA 90 mcg/actuation inhaler Inhale 2 puffs every four (4) hours as needed for wheezing. 54 g 0    azelastine (ASTELIN) 137 mcg (0.1 %)  nasal spray 2 sprays into each nostril Two (2) times a day. 30 mL 12    azelastine (OPTIVAR) 0.05 % ophthalmic solution Administer 2 drops to both eyes daily as needed. 6 mL 5    baclofen (LIORESAL) 20 MG tablet 10 - 20 mg three times a day as needed for spasms. 90 tablet 2    celecoxib (CELEBREX) 200 MG capsule Take 1 capsule (200 mg total) by mouth daily. 30 capsule 0    cetirizine (ZYRTEC) 10 MG chewable tablet Chew 1 tablet (10 mg total) at bedtime as needed for allergies.      diclofenac sodium (PENNSAID) 20 mg/gram /actuation(2 %) sopm Apply 2 pumps to affected area up to four times a day. 112 g 2    dicyclomine (BENTYL) 20 mg tablet Take 1 tablet (20 mg total) by mouth Three (3) times a day as needed for up to 10 days. 30 tablet 0    empty container Misc Use as directed to dispose of Xolair syringes 1 each 2    EPINEPHrine (EPIPEN) 0.3 mg/0.3 mL injection Inject 0.3 mL (0.3 mg total) into the muscle once as needed for anaphylaxis (Difficulty breathing, throat closing, etc) for up to 1 dose. 1 each 12    estradiol (ESTRACE) 0.5 MG tablet Take 1 tablet (0.5 mg total) by mouth daily. 30 tablet 1    famotidine (PEPCID) 20 MG tablet Take 1 tablet (20 mg total) by mouth two (2) times a day.      gabapentin (NEURONTIN) 100 MG capsule Take 1 capsule (100 mg total) by mouth in the morning. 90 capsule 0    gabapentin (NEURONTIN) 800 MG tablet Take 1 tablet (800 mg total) by mouth two (2) times a day. (Patient taking differently: Take 1 tablet (800 mg total) by mouth daily.) 180 tablet 0    hydroCHLOROthiazide (HYDRODIURIL) 12.5 MG tablet TAKE 1 TABLET(12.5 MG) BY MOUTH DAILY AS NEEDED FOR SWELLING 30 tablet 5    montelukast (SINGULAIR) 10 mg tablet Take 1 tablet (10 mg total) by mouth nightly. 30 tablet 2    naloxone (NARCAN) 4 mg nasal spray One spray in either nostril once for known/suspected opioid overdose. May repeat every 2-3 minutes in alternating nostril til EMS arrives 1 each 0    nortriptyline (PAMELOR) 25 MG capsule Take 1 capsule (25 mg total) by mouth nightly. 90 capsule 1    omalizumab (XOLAIR) 150 mg/mL syringe Inject the contents of 2 syringes (300 mg total) under the skin every twenty-eight (28) days. 2 mL 11    oxyCODONE-acetaminophen (PERCOCET) 10-325 mg per tablet Take 1 tablet by mouth every six (6) hours as needed for pain. Max 4 tabs/day. Fill on or after: 05/07/22. Brand name only due to allergy. 120 tablet 0    [START ON 06/06/2022] oxyCODONE-acetaminophen (PERCOCET) 10-325 mg per tablet Take 1 tablet by mouth every six (6) hours as needed for pain. Max 4 tabs/day. Fill on or after: 06/06/22. Brand name only due to allergy. 120 tablet 0    [START ON 07/06/2022] oxyCODONE-acetaminophen (PERCOCET) 10-325 mg per tablet Take 1 tablet by mouth every four (4) hours as needed for pain. Max 4 tabs/day. Fill on or after: 07/06/22. Brand name only due to allergy. 120 tablet 0    pantoprazole (PROTONIX) 40 MG tablet Take 1 tablet (40 mg total) by mouth daily as needed (take it 30 mins prior to breakfast). 30 tablet 3    potassium chloride 20 MEQ CR tablet Take 1 tablet (20  mEq total) by mouth daily. 90 tablet 2    PROCHAMBER Spcr       rizatriptan (MAXALT-MLT) 10 MG disintegrating tablet Take 1 tablet by mouth at ONSET of migraine or aura. May repeat in 2 HOURS if needed. DO NOT EXCEED 2 doses/day, 2 days/week, 8 doses/month. 10 tablet 5    rosuvastatin (CRESTOR) 10 MG tablet TAKE 1 TABLET(10 MG) BY MOUTH EVERY NIGHT 90 tablet 0    triamcinolone (KENALOG) 0.1 % cream Apply 1 application topically two (2) times a day as needed. WHEN ALLERGIES FLARE-UP AND CAUSES RASH / ITCHING 80 g 0    XHANCE 93 mcg/actuation AerB 1 spray into each nostril two (2) times a day as needed. 16 mL 11     Current Facility-Administered Medications   Medication Dose Route Frequency Provider Last Rate Last Admin    omalizumab Geoffry Paradise) injection 300 mg  300 mg Subcutaneous Q28 Days Volertas, Sofija Dalia, MD   300 mg at 03/09/22 1441       ROS:     Review of Systems    Vital Signs:     Wt Readings from Last 3 Encounters:   04/09/22 75.8 kg (167 lb 3.2 oz)   04/09/22 75.8 kg (167 lb)   04/08/22 76 kg (167 lb 8 oz)     Temp Readings from Last 3 Encounters:   05/03/22 36.3 ??C (97.3 ??F) (Skin)   04/09/22 36.6 ??C (97.9 ??F)   04/08/22 36.7 ??C (98.1 ??F) (Skin)     BP Readings from Last 3 Encounters:   05/03/22 124/88   04/09/22 124/72   04/08/22 118/86     Pulse Readings from Last 3 Encounters:   05/03/22 81   04/09/22 82   04/08/22 85     There is no height or weight on file to calculate BMI.    Objective:      Physical Exam  Nursing note reviewed.   Constitutional:       General: She is not in acute distress.     Appearance: She is well-developed.   Pulmonary:      Effort: Pulmonary effort is normal. No respiratory distress.   Neurological:      Mental Status: She is alert and oriented to person, place, and time.   Psychiatric:         Behavior: Behavior normal.         Thought Content: Thought content normal.         Judgment: Judgment normal.           Labs:     No results found for this visit on 05/07/22.    Royal Hawthorn, MD  05/07/2022        The patient reports they are physically located in [place] and is currently: at home. I conducted a audio/video visit. I spent  50m 46s on the video call with the patient. I spent an additional 2 minutes on pre- and post-visit activities on the date of service .

## 2022-05-07 NOTE — Unmapped (Signed)
Pre-procedure call made and RN spoke with patient.    Denies recent infection/antibiotics.   Denies being diabetic.  Denies taking any recent blood thinners/NSAIDs.   Denies any electronic implants  Denies pregnancy    Driver necessary    Patient informed to arrive 30 minutes before procedure appointment time.  Patient verbalized understanding to all.

## 2022-05-11 ENCOUNTER — Telehealth: Admit: 2022-05-11 | Discharge: 2022-05-11 | Payer: MEDICARE | Attending: Psychologist | Primary: Psychologist

## 2022-05-11 ENCOUNTER — Ambulatory Visit: Admit: 2022-05-11 | Discharge: 2022-05-11 | Payer: MEDICARE | Attending: Anesthesiology | Primary: Anesthesiology

## 2022-05-11 ENCOUNTER — Ambulatory Visit: Admit: 2022-05-11 | Discharge: 2022-05-11 | Payer: MEDICARE | Attending: Internal Medicine | Primary: Internal Medicine

## 2022-05-11 DIAGNOSIS — F419 Anxiety disorder, unspecified: Principal | ICD-10-CM

## 2022-05-11 DIAGNOSIS — F4321 Adjustment disorder with depressed mood: Principal | ICD-10-CM

## 2022-05-11 DIAGNOSIS — F1190 Opioid use, unspecified, uncomplicated: Principal | ICD-10-CM

## 2022-05-11 DIAGNOSIS — M47812 Spondylosis without myelopathy or radiculopathy, cervical region: Principal | ICD-10-CM

## 2022-05-11 MED ORDER — BACLOFEN 20 MG TABLET
ORAL_TABLET | 2 refills | 0 days | Status: CP
Start: 2022-05-11 — End: ?

## 2022-05-11 MED ORDER — GABAPENTIN 100 MG CAPSULE
ORAL_CAPSULE | Freq: Every day | ORAL | 0 refills | 90 days | Status: CP
Start: 2022-05-11 — End: 2022-11-07

## 2022-05-11 MED ORDER — CELECOXIB 200 MG CAPSULE
ORAL_CAPSULE | Freq: Every day | ORAL | 2 refills | 30 days | Status: CP
Start: 2022-05-11 — End: 2022-08-09

## 2022-05-11 MED ADMIN — fentaNYL (PF) (SUBLIMAZE) injection 50 mcg: 50 ug | INTRAVENOUS | @ 13:00:00 | Stop: 2022-05-11

## 2022-05-11 MED ADMIN — lidocaine (XYLOCAINE) 5 mg/mL (0.5 %) injection 17 mL: 17 mL | @ 14:00:00 | Stop: 2022-05-11

## 2022-05-11 MED ADMIN — fentaNYL (PF) (SUBLIMAZE) injection 25 mcg: 25 ug | INTRAVENOUS | @ 13:00:00 | Stop: 2022-05-11

## 2022-05-11 MED ADMIN — lidocaine (XYLOCAINE) 20 mg/mL (2 %) injection 6 mL: 6 mL | @ 14:00:00 | Stop: 2022-05-11

## 2022-05-11 NOTE — Unmapped (Signed)
PROCEDURE;  Radiofrequency Ablation Of bilateral C4, C5, C6Cervical Medial Branch Nerves    PRE-PROCEDURE DIAGNOSIS: Cervical Spondylosis without myelopathy or radiculopathy  M47.812  POST-PROCEDURE DIAGNOSIS:  Same    PERFORMED BY: Dr. Criss Rosales  ASSISTANT: None  Anesthesia: Local and IV Fentanyl    Natalie [name] is a 52 y.o. female with Cervical spondylosis without myelopathy s/p diagnostic medial branch blocks with good but temporary relief of pain, confirming the facet joints as the source.  We will plan to proceed with bilateral C4, C5, C6 medial branch nerve RFA  upon approval.  After failing conservative measures, through the process of shared decision making, we have decided to move forward with the below procedure.     Procedure ordered: bilateral C4, C5, C6 medial branch nerve RFA  Diagnosis:  Cervical spondylosis  Is the associated diagnosis spondylosis without myelopathy or radiculopathy? Yes. (if myelopathy/radiculopathy present, do not proceed or provide additional information)  Had previously?: No.. Last: N/A  If yes, results (% relief or %improvement in function and duration): N/A  If yes to previous RFA, was there 50% pain improvement for 6 months or 50% consistent improvement of ADL???s or previously painful movements.  N/A    Has the patient completed  diagnostic medial branch blocks with greater than 80% pain relief after each block? Yes.  1st MMB 85% relief for 48 hours (duration) on 02/05/22, then pain returned to baseline  2nd MBB 80% relief for 24 hours (duration) on 03/30/22, then pain returned to baseline  Have conservative options been tried within the past 12 months, including diagnosis-specific physical therapy or home exercise program for at least 6 weeks: Yes.   HEP: Yes. 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  Last PT:   Anticoagulants: None.  Instructions? N/A  Labs required?: No.  Driver: Yes.  PIV: Yes.  Risk category (consider age, hx bleeding issues, anticoagulants, liver dx, kidney dx): intermediate  Expected timing: Next available  Pertinent physical exam findings: 01/11/22-+pain on facet loading bilaterally cervical region.   Length of procedure: 60 minutes (30 for unilateral, 60 for bilateral usually)    DESCRIPTION OF THE PROCEDURE:     Informed consent was obtained and potential risks discussed including, but not limited to: bleeding, bruising, severe allergic reaction to components of the injection materials, compression of the spinal cord, infection (superficial, deep, abscess and meningitis), nerve or spinal cord damage, paralysis, inability to place the needle properly, arachnoiditis, the possibility of no benefit (pain relief) derived from the injection, or in rare occasions worsening of pain. Questions were answered to the patient's satisfaction and the patient wishes to proceed. Alternative options for treatment have previously been discussed and explored with the patient. The patient was marked on the appropriate side.    The patient is not taking antiplatelet or anticoagulation medications and does  have a driver today.  A PIV was placed in the upper extremity.    After informed consent was obtained the patient was placed in prone  position on the fluoroscopy table and all pressure points padded. Standard ASA monitors were applied. A timeout protocol was performed. Hand washing with antibacterial soap and water and/or the use of alcohol based cleanser was performed. Proper protective gear was worn by the physician including a mask, scrub cap, and sterile gloves.    The patient's neck and lateral to the mastoid processes was prepped with chloroprepx2 and draped in a sterile fashion. Skin and subcutaneous tissue overlying the intended  target areas were anesthetized with small amount of 0.5% lidocaine. Then  20 gauge 10 cm long curved tip radiofrequency cannula with a 10 mm active tip was advanced in antero/posterior fluoroscopic view towards the C4, C5, C6   articular pillars. Once the center of each pillar was reached the cannulas were stimulated.     With impedance greater than 200 0hms, each cannula was stimulated at 50 Hz up to 1 volt. The needle position was redefined such that the patient experienced aching, burning, or tingling back pain. At each level this was obtained at approximately below 1 volt at 50 Hz.     Motor stimulation at each level was then undertaken using 2 Hz at up to three times the sensory voltage, and the patient exhibited no radicular motor stimulation with final needle placement. At this point, after negative aspiration for heme/CSF,  1 ml of 2% lidocaine was injected at each level. One hundred twenty seconds later each level was heated to 80 degrees C for 90 seconds using radiofrequency generator and the needles were removed intact.    The procedure was then repeated in an identical fashion on the contralateral side.  She did have heme at the C5 cannula on the left, so it was repositioned until no additional heme was aspirated.    At almost all testing sites on the left the patient noted motor stimulation in the hand.  Although on exam nothing was visualized.  I pulled back to what was definitely further away and she still reported it.  I felt like it was at a safe distance from the foramen but still on the lamina.      The patient did tolerate the procedure well and there were no apparent complications. All injection sites were sterilely dressed. A neurological assessment 15 minutes following the procedure was unchanged. The patient was discharged after an appropriate period of observation and post procedural education was given.    Total Fluoroscopic time  59  seconds.  Pre-procedure pain score was 8/10  Post-procedure pain score is 9/10    DISPO:    Has follow up on 5/7 (clinic) and 5/22 (TPI)

## 2022-05-11 NOTE — Unmapped (Signed)
[facility]   Confidential Psychological Therapy Session    Patient Name: Natalie [name]  Medical Record Number: [id number]  Date of Service: May 11, 2022  Attending Psychologist: Caroline More, PhD  CPT Procedure Code: 81191 for 45 minutes of face to face counseling    This visit was performed face to face with interactive technology using a HIPPA compliant audio/visual platform. We reviewed confidentiality today. The patient was present in [place], a state in which this provider is licensed and able to provide care (location and contact information confirmed), attended this visit alone, and consented to this virtual pain psychology visit.    REFERRING PHYSICIAN: Criss Rosales, MD    CHIEF COMPLAINT AND REASON FOR REFERRAL:  COM follow up evaluation/pain coping skills; CBT/ACT for pain; grief    SUBJECTIVE / HISTORY OF PRESENT ILLNESS: [name] is a very pleasant 52 y.o.  female with chronic lower back pain, chronic headaches, and myofascial pain. Her pain started in 1999 in her lower back.  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  She had a spinal fusion in 2001 at the L5-S1 level.  She has had bilateral shoulder pain, neck pain, lower back pain, and b/l knees.  She also described a radiculopathy b/l that goes from her back to her bilateral buttocks and down to her toes.      Pt initially established with Dr. Oda Kilts in 08/2016 and then was was first evaluated by me in 07/2017. Last follow up with me was 04/30/2022. Pt had her court hearing and was given 1 year of unsupervised probation which can be expunged after 1 year. This was best case scenario. She plead guilty out of an abundance of caution related to the custody of her niece after the girl's mother died. She has had significant stress associated with family. She was advised to plead guilty so that case could settle. She was afraid she might lose her job due to the charge. She is relieved it worked out. She describes sign family and psychosocial stressors recently.  She processes thoughts and feelings about stressors using a mindfulness approach today. Ended with mindful breathing and body scan.     Recently had RFA - more pain due to this but hoping pain overall improves with the procedure.     OBJECTIVE / MENTAL STATUS:    Appearance:   Appears stated age and Clean/Neat   Motor:  No abnormal movements   Speech/Language:   Normal rate, volume, tone, fluency   Mood:  Euthymic, improved   Affect:  Bright! Mood congruent   Thought process:  Logical, linear, clear, coherent, goal directed   Thought content:    Denies SI, HI, self harm, delusions, obsessions, paranoid ideation, or ideas of reference   Perceptual disturbances:    Denies auditory and visual hallucinations, behavior not concerning for response to internal stimuli   Orientation:  Oriented to person, place, time, and general circumstances   Attention:  Able to fully attend without fluctuations in consciousness   Concentration:  Able to fully concentrate and attend   Memory:  Immediate, short-term, long-term, and recall grossly intact    Fund of knowledge:   Consistent with level of education and development   Insight:    Fair   Judgment:   Intact   Impulse Control:  Intact       DIAGNOSTIC IMPRESSION:   Anxiety NOS  Chronic continuou use of opiates  Grief    ASSESSMENT:   [name] is a very pleasant 52 y.o.  female from [place], [place] with chronic lower back pain, chronic headaches, and myofascial pain. Her pain started in 1999 in her lower back.  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  She had a spinal fusion in 2001 at the L5-S1 level.  She has had bilateral shoulder pain, neck pain, lower back pain, and b/l knees.  She also described a radiculopathy b/l that goes from her back to her bilateral buttocks and down to her toes.  The patient is currently considered to be high risk due to prior nonadherence, but appropriate with a behavioral adherence plan in place.  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 She continues to participate actively in psychotherapy.    PLAN:   (1) COM - high risk but remains appropriate with a behavioral adherence plan also in place.    -Patient MUST meet with pain psychology/me once a month.   -No additional overuse of opioids will be tolerated.    -Patient should always bring her medication to clinic for pill counts.  -Patient was informed she has had numerous infractions with respect to her pain contract over the years, and that no further infractions will be tolerated.    If any additional pain contract or behavioral adherence contract infractions occur, I recommend stopping opioids.    Pt doing well with adherence, per our last few visits.     (2) Pain coping skills- addressed compliance, as well as pacing and mindful breathing, body scan, and problem solving    (3) follow-in 1 month

## 2022-05-11 NOTE — Unmapped (Signed)
Fax from [facility] needing refills on meds for 90 day supply.      Refill request received for patient.      Medication Requested: Baclofen, Celecoxib, Gabapentin   Last Office Visit: 04/08/2022   Next Office Visit: 06/01/2022  Last Prescriber: Doylene Canard    Please refill if appropriate

## 2022-05-11 NOTE — Unmapped (Signed)
AVS reviewed with patient and she verbalized understanding to all.  Has driver.

## 2022-05-11 NOTE — Unmapped (Signed)
POST PROCEDURE INSTRUCTIONS   You may apply an ice pack 20-30 minutes at a time to the injection site if you experience soreness.     Keep the injection site clean and dry. You make remove the band-aid one day following the procedure.     You may take a shower but AVOID getting in to baths, pools or whirlpools for 48 HOURS AFTER THE PROCEDURE.       ACTIVITY   Refrain from heavy activity for the next 24 to 48 hours. General walking is okay. You may resume your normal activities the day following the procedure.   You may start or resume your individualized exercise program or physical therapy 48 hours after the procedure.  IF YOU'VE HAD TRIGGER POINT INJECTIONS, YOU MAY CONTINUE EXERCISE OR PHYSICAL THERAPY WITHOUT DELAY.  MEDICATIONS   Please note that it is okay to continue other prescribed medications (blood pressure, insulin, water pill, depression/anxiety pill, etc.) as well as other prescribed pain medications such as Neurontin, Lyrical, Celebrex, Ultram, Vicodin, Norco and acetaminophen (Tylenol).   SIDE EFFECTS   Increase in pain during the first 24 to 48 hours.     You might experience:   1. Mild to moderate swelling at the joint.   2. Possible bruising at the injection site.     WHEN TO CALL THE DOCTOR/NURSE   Severe pain, worse or different that the pain you had before the procedure.     Fever or chills.     Redness, or swelling around the injection site.     Call the Pain Management  Procedural nurses [phone],  during normal business hours (7 am-3 pm). If it is AFTER HOURS or during a weekend or holiday, call the hospital operator and ask for the Anesthesia Pain physician on call at [phone].   FOR EMERGENCIES, CALL 911 OR GO TO THE NEAREST HOSPITAL EMERGENCY DEPARTMENT.   ?   TO SCHEDULE APPOINTMENTS OR FOR QUESTIONS RELATED TO MEDICATIONS   Call the Pain Management Clinic at [phone] POST SEDATION GUIDELINES/DISCHARGE INSTRUCTIONS    The medicines given to you today for your procedure will stay in your body for some time. You may feel dizzy or lose your sense of balance. The use of your muscles may be changed. Your judgment may be affected. Your reaction time, for example, when driving a car, will be slower. You may not see any of these changes in yourself. In general, you should completely recover from these medicines by tomorrow. For your own safety, we have some strict rules for you to follow for the next 12 hours.      The 7D???s  Do not DRIVE.    Do not use appliances or equipment that could be DANGEROUS, like power tools, stove, burners, lawnmowers, garbage disposals.     Watch out for DIZZINESS. Walk slowly and take your time. Sudden changes of position can also cause nausea.     Do not make any important DECISIONS. You may change your mind tomorrow.     Do not DRINK alcoholic beverages. The drugs in your body may cause your reaction to alcohol to be dangerous.     DIET: If you feel nauseated or ???sick to your stomach???, drink clear liquids like 7-Up, broth, apple juice, ginger ale, tea, and cola or eat jello. If these liquids do not make you ???sick to your stomach???, try eating soft foods like potatoes, rice, pasta and cereal. Tomorrow you can eat regular foods.  DISCUSS any questions you may have with your primary care doctor or the doctor who ordered your test today.

## 2022-05-17 ENCOUNTER — Ambulatory Visit: Admit: 2022-05-17 | Discharge: 2022-05-18 | Payer: MEDICARE | Attending: Family | Primary: Family

## 2022-05-17 DIAGNOSIS — G932 Benign intracranial hypertension: Principal | ICD-10-CM

## 2022-05-17 DIAGNOSIS — G4440 Drug-induced headache, not elsewhere classified, not intractable: Principal | ICD-10-CM

## 2022-05-17 DIAGNOSIS — G43109 Migraine with aura, not intractable, without status migrainosus: Principal | ICD-10-CM

## 2022-05-17 MED ORDER — PREDNISONE 50 MG TABLET
ORAL_TABLET | Freq: Every day | ORAL | 0 refills | 5 days | Status: CP
Start: 2022-05-17 — End: ?

## 2022-05-17 MED ORDER — RIMEGEPANT 75 MG DISINTEGRATING TABLET
ORAL_TABLET | Freq: Every day | ORAL | 2 refills | 18 days | Status: CP | PRN
Start: 2022-05-17 — End: ?
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
## 2022-05-17 NOTE — Unmapped (Signed)
Review of Systems   Genitourinary:  Positive for frequency.   Skin:  Positive for itching.   Neurological:  Positive for tingling, weakness and headaches.   All other systems reviewed and are negative.

## 2022-05-17 NOTE — Unmapped (Addendum)
- Brain MRI with and without contrast [phone]  - Make an eye appointment  - Tylenol, Excedrin, BC Goody Powders and ALL pain relievers should be used no more than 2-3 doses per week to avoid rebound (or medication overuse) headaches.  - Prednisone 50 mg daily in the morning for 5 days  - Botox injections: We will call you if it is approved. There will be a delay as we are short staffed on the prior authorization part.   - Nurtec 75 mg at migraine onset for rescue #18 RF 3  - Riboflavin (vitamin B2) 400 mg daily   - Magnesium glycinate (Note: NOT magnesium oxalate) up to 400 mg at bedtime   - Keep a headache diary  - Return in 4 months       Ten Things That You Can Do to Help Your Problems with Headaches    There are some things that everyone can do to help headaches. Maintaining a regular schedule for eating, sleeping and exercising is most helpful.  I have decided to make a list of these things to hand out to all my headache patients, so that everyone knows what they can do besides take medications to help with headaches.  If someone follows all of these directions, the need for prescription medications is often dramatically reduced, if not eliminated.     First and foremost, taking pain medication every day is a definite no-no. Daily pain medication tends to perpetuate headaches. This is true for over-the-counter medications like acetaminophen, ibuprofen, Excedrin and BC powders, as well as prescription medications like Fioricet, Midrin, and triptans like Imitrex, Zomig, Relpax, Frova, etc.  Exactly why this occurs is unclear.  It is a well-established clinical finding now, though. Anyone who takes pain medications more than twice a week is in danger of perpetuating their headaches. Occasional usage of pain medications several times in one week is permissible, as long as it is not a regular pattern. For instance, using pain medications for several days in a row during the perimenstrual period is certainly permissible.    Regular exercise helps reduce headaches. Exercise stimulates the release of endorphins in the brain. These are chemicals that actually suppress pain. I encourage people to aim for at least 20 minutes of aerobic exercise (like walking or swimming) five days a week, if not daily. In addition to helping reduce headaches, this will also prolong life because of the beneficial effects on your heart.    Stress reduction is a definite benefit in reducing headache frequency and severity. Headaches are not from stress alone, as a rule. Usually, people have a tendency for headaches that is made worse by stress. I don't have easy answers on stress reduction. If it is severe, we can consider referral to a therapist for help in learning techniques like relaxation and biofeedback. I have also encouraged people to buy the book ???Don't Sweat the Small Stuff??? because it contains a lot of very helpful suggestions on stress reduction.    Too much sleep and too little sleep can trigger headaches. Pay attention to this, and note whether or not you are tending to trigger headaches with sleeping too long or sleeping too little. People differ as to how much sleep is ???right??? for them.    Caffeine can precipitate headaches. I encourage patients to try stopping caffeine all together for a few weeks, and we can decide together whether or not caffeine might be contributing.    NutraSweet (aspartame) and other artifical sweeteners  can cause headaches in some people.  If you're drinking a lot of beverages containing artifical sweeteners, you might want to consider trying to stop that and see if your headaches respond.    There are some other foods that may trigger headaches for some people. Usually, people learn this very quickly. For instance, red wine will precipitate migraines in many people. Chocolate and nuts may cause problems in others, and in some people hot dogs (because of the nitrites) and another is [place] food (because of the monosodium glutamate) may cause headaches. I generally don't advise omitting all of these foods, unless you notice a pattern where these foods are causing headaches.    If I give you a preventive medication for headaches, you should take it daily, as prescribed. If you have trouble tolerating it, please let me know and we can consider using something else. No preventive medication works in every patient with headaches. Generally, each of the medications works in only about 60% of people. Therefore, it is not uncommon to need to try more than one medication in any given patient. We must give any of these medications at least four to six weeks to work before giving up on them. It generally takes that long to be sure whether or not the medication is going to work. Success is defined as a 50% reduction in frequency, duration or need for rescue medications. It can take several months to achieve maximum benefit.     Keep a calendar of your headaches. Use a standard calendar and mark the days that you have a headache, how severe it is on a scale of one to ten, what you took for it and how long it lasted. Also note anything that you think could have precipitated it. By keeping this over time, we can tell if our efforts are helping.    The following vitamins/supplements have been well studied and show improvement in headaches. You can add these to anything else we try. You do not need a prescription for these. They will take a few months to see how effective they will be for you.  Riboflavin (vitamin B2) 400 mg daily   Magnesium glycinate (Note: NOT magnesium oxalate) up to 400 mg at bedtime

## 2022-05-18 DIAGNOSIS — R1032 Left lower quadrant pain: Principal | ICD-10-CM

## 2022-05-18 MED ORDER — DICYCLOMINE 20 MG TABLET
ORAL_TABLET | 0 refills | 0 days
Start: 2022-05-18 — End: ?

## 2022-05-18 MED ORDER — UBROGEPANT 50 MG TABLET
ORAL_TABLET | 3 refills | 0 days | Status: CP
Start: 2022-05-18 — End: ?

## 2022-05-18 NOTE — Unmapped (Signed)
Patient is requesting the following refill  Requested Prescriptions     Pending Prescriptions Disp Refills    dicyclomine (BENTYL) 20 mg tablet [Pharmacy Med Name: DICYCLOMINE 20MG  TABLETS] 30 tablet 0     Sig: TAKE 1 TABLET(20 MG) BY MOUTH THREE TIMES DAILY FOR UP TO 10 DAYS AS NEEDED       Recent Visits  Date Type Provider Dept   05/07/22 Telemedicine Neelagiri, Ardyth Man, MD [place] [facility]   04/09/22 Office Visit Neelagiri, Ardyth Man, MD [place] [facility]   03/29/22 Telemedicine Neelagiri, Ardyth Man, MD [place] [facility]   03/25/22 Office Visit Neelagiri, Ardyth Man, MD [place] [facility]   03/22/22 Office Visit Karyl Kinnier, [place] [facility]   02/24/22 Telemedicine Neelagiri, Ardyth Man, MD [place] [facility]   01/20/22 Office Visit Neelagiri, Ardyth Man, MD [place] [facility]   01/07/22 Telemedicine Karyl Kinnier, [place] [facility]   11/12/21 Telemedicine Neelagiri, Ardyth Man, MD [place] [facility]   10/20/21 Office Visit Neelagiri, Ardyth Man, MD [place] [facility]   Showing recent visits within past 365 days with a meds authorizing provider and meeting all other requirements  Future Appointments  No visits were found meeting these conditions.  Showing future appointments within next 365 days with a meds authorizing provider and meeting all other requirements

## 2022-05-19 DIAGNOSIS — N951 Menopausal and female climacteric states: Principal | ICD-10-CM

## 2022-05-19 MED ORDER — ESTRADIOL 0.5 MG TABLET
ORAL_TABLET | 1 refills | 0 days | Status: CP
Start: 2022-05-19 — End: ?

## 2022-05-19 MED ORDER — DICYCLOMINE 20 MG TABLET
ORAL_TABLET | 0 refills | 0 days | Status: CP
Start: 2022-05-19 — End: ?

## 2022-05-19 NOTE — Unmapped (Signed)
Patient is requesting the following refill  Requested Prescriptions     Pending Prescriptions Disp Refills    estradiol (ESTRACE) 0.5 MG tablet [Pharmacy Med Name: ESTRADIOL 0.5MG  TABLETS] 30 tablet 1     Sig: TAKE 1 TABLET(0.5 MG) BY MOUTH DAILY       Recent Visits  Date Type Provider Dept   05/07/22 Telemedicine Neelagiri, Ardyth Man, MD [place] [facility]   04/09/22 Office Visit Neelagiri, Ardyth Man, MD [place] [facility]   03/29/22 Telemedicine Neelagiri, Ardyth Man, MD [place] [facility]   03/25/22 Office Visit Neelagiri, Ardyth Man, MD [place] [facility]   03/22/22 Office Visit Karyl Kinnier, [place] [facility]   02/24/22 Telemedicine Neelagiri, Ardyth Man, MD [place] [facility]   01/20/22 Office Visit Neelagiri, Ardyth Man, MD [place] [facility]   01/07/22 Telemedicine Karyl Kinnier, [place] [facility]   11/12/21 Telemedicine Neelagiri, Ardyth Man, MD [place] [facility]   10/20/21 Office Visit Neelagiri, Ardyth Man, MD [place] [facility]   Showing recent visits within past 365 days with a meds authorizing provider and meeting all other requirements  Future Appointments  No visits were found meeting these conditions.  Showing future appointments within next 365 days with a meds authorizing provider and meeting all other requirements

## 2022-05-24 NOTE — Unmapped (Signed)
[facility] Refill Coordination Note    Natalie [name], Natalie [name]: [birth year]  Phone: There are no phone numbers on file.      All above HIPAA information was verified with patient.         05/22/2022     1:58 PM   Specialty Rx Medication Refill Questionnaire   Which Medications would you like refilled and shipped? Xolair   Please list all current allergies: Pollen ragweed grass   Have you missed any doses in the last 30 days? No   Have you had any changes to your medication(s) since your last refill? Yes   Please list your medication(s) changes below. Nurtec   How many days remaining of each medication do you have at home? 12   If receiving an injectable medication, next injection date is 06/07/2022   Have you experienced any side effects in the last 30 days? No   Please enter the full address (street address, city, state, zip code) where you would like your medication(s) to be delivered to. [address] [name] [address]   Please specify on which day you would like your medication(s) to arrive. Note: if you need your medication(s) within 3 days, please call the pharmacy to schedule your order at [phone]  06/04/2022   Has your insurance changed since your last refill? No   Would you like a pharmacist to call you to discuss your medication(s)? No   Do you require a signature for your package? (Note: if we are billing Medicare Part B or your order contains a controlled substance, we will require a signature) No         Completed refill call assessment today to schedule patient's medication shipment from the [facility] [phone]).  All relevant notes have been reviewed.       Confirmed patient received a Conservation officer, historic buildings and a Surveyor, mining with first shipment. The patient will receive a drug information handout for each medication shipped and additional FDA Medication Guides as required.         REFERRAL TO PHARMACIST     Referral to the pharmacist: Not needed      [facility]     Shipping address confirmed in Epic.     Delivery Scheduled: Yes, Expected medication delivery date: 06/04/22.     Medication will be delivered via Same Day Courier to the prescription address in Epic WAM.    Natalie [name]' W Natalie [name] Shared [facility] Specialty Technician

## 2022-05-25 NOTE — Unmapped (Signed)
Error

## 2022-06-04 DIAGNOSIS — L501 Idiopathic urticaria: Principal | ICD-10-CM

## 2022-06-04 DIAGNOSIS — M7918 Myalgia, other site: Principal | ICD-10-CM

## 2022-06-04 DIAGNOSIS — M5417 Radiculopathy, lumbosacral region: Principal | ICD-10-CM

## 2022-06-04 DIAGNOSIS — J3089 Other allergic rhinitis: Principal | ICD-10-CM

## 2022-06-04 DIAGNOSIS — J302 Other seasonal allergic rhinitis: Principal | ICD-10-CM

## 2022-06-04 DIAGNOSIS — E7800 Pure hypercholesterolemia, unspecified: Principal | ICD-10-CM

## 2022-06-04 DIAGNOSIS — L282 Other prurigo: Principal | ICD-10-CM

## 2022-06-04 DIAGNOSIS — E876 Hypokalemia: Principal | ICD-10-CM

## 2022-06-04 MED ORDER — AZELASTINE 137 MCG (0.1 %) NASAL SPRAY AEROSOL
Freq: Two times a day (BID) | NASAL | 12 refills | 0 days
Start: 2022-06-04 — End: ?

## 2022-06-04 MED ORDER — GABAPENTIN 800 MG TABLET
ORAL_TABLET | Freq: Two times a day (BID) | ORAL | 0 refills | 90 days
Start: 2022-06-04 — End: ?

## 2022-06-04 MED ORDER — ROSUVASTATIN 10 MG TABLET
ORAL_TABLET | Freq: Every evening | ORAL | 0 refills | 90 days
Start: 2022-06-04 — End: ?

## 2022-06-04 MED ORDER — TRIAMCINOLONE ACETONIDE 0.1 % TOPICAL CREAM
0 refills | 0 days
Start: 2022-06-04 — End: ?

## 2022-06-04 MED ORDER — POTASSIUM CHLORIDE ER 20 MEQ TABLET,EXTENDED RELEASE(PART/CRYST)
ORAL_TABLET | Freq: Every day | ORAL | 2 refills | 90 days
Start: 2022-06-04 — End: ?

## 2022-06-04 NOTE — Unmapped (Signed)
Natalie [name] 's Xolair shipment will be delayed as a result of prior authorization being required by the patient's insurance.     I have reached out to the patient  at [phone] and communicated the delay. We will call the patient back to reschedule the delivery upon resolution. We have not confirmed the new delivery date.

## 2022-06-06 MED ORDER — OXYCODONE-ACETAMINOPHEN 10 MG-325 MG TABLET
ORAL_TABLET | Freq: Four times a day (QID) | ORAL | 0 refills | 30 days | Status: CP | PRN
Start: 2022-06-06 — End: 2022-07-06

## 2022-06-07 MED ORDER — POTASSIUM CHLORIDE ER 20 MEQ TABLET,EXTENDED RELEASE(PART/CRYST)
ORAL_TABLET | Freq: Every day | ORAL | 3 refills | 90 days | Status: CP
Start: 2022-06-07 — End: ?

## 2022-06-07 MED ORDER — GABAPENTIN 800 MG TABLET
ORAL_TABLET | Freq: Two times a day (BID) | ORAL | 0 refills | 90 days | Status: CP
Start: 2022-06-07 — End: ?
  Filled 2022-06-09: qty 180, 90d supply, fill #0

## 2022-06-07 MED ORDER — AZELASTINE 137 MCG (0.1 %) NASAL SPRAY AEROSOL
Freq: Two times a day (BID) | NASAL | 0 refills | 0 days | Status: CP
Start: 2022-06-07 — End: ?
  Filled 2022-06-09: qty 30, 30d supply, fill #0

## 2022-06-07 MED ORDER — ROSUVASTATIN 10 MG TABLET
ORAL_TABLET | Freq: Every evening | ORAL | 3 refills | 90 days | Status: CP
Start: 2022-06-07 — End: ?
  Filled 2022-06-09: qty 90, 90d supply, fill #0

## 2022-06-07 MED ORDER — TRIAMCINOLONE ACETONIDE 0.1 % TOPICAL CREAM
0 refills | 0 days
Start: 2022-06-07 — End: ?

## 2022-06-07 NOTE — Unmapped (Signed)
Refill request received for patient.      Medication Requested: Gabapentin 800 mg   Last Office Visit: 04/08/2022   Next Office Visit: 06/16/2022  Last Prescriber: Franne Grip [facility])     Please refill if appropriate

## 2022-06-07 NOTE — Unmapped (Signed)
Unable to complete refill request, medication is not included in the refill protocol. .   Please review below request.       Patient is requesting the following refill  Requested Prescriptions     Pending Prescriptions Disp Refills    triamcinolone (KENALOG) 0.1 % cream 80 g 0     Sig: Apply 1 application topically two (2) times a day as needed. WHEN ALLERGIES FLARE-UP AND CAUSES RASH / ITCHING    potassium chloride 20 MEQ ER tablet 90 tablet 2     Sig: Take 1 tablet (20 mEq total) by mouth daily.    azelastine (ASTELIN) 137 mcg (0.1 %) nasal spray 30 mL 12     Sig: 2 sprays into each nostril two (2) times a day.    rosuvastatin (CRESTOR) 10 MG tablet 90 tablet 3     Sig: Take 1 tablet (10 mg total) by mouth nightly.       Recent Visits  Date Type Provider Dept   05/07/22 Telemedicine Neelagiri, Ardyth Man, MD [place] [facility]   04/09/22 Office Visit Neelagiri, Ardyth Man, MD [place] [facility]   03/29/22 Telemedicine Neelagiri, Ardyth Man, MD [place] [facility]   03/25/22 Office Visit Neelagiri, Ardyth Man, MD [place] [facility]   03/22/22 Office Visit Karyl Kinnier, [place] [facility]   02/24/22 Telemedicine Neelagiri, Ardyth Man, MD [place] [facility]   01/20/22 Office Visit Neelagiri, Ardyth Man, MD [place] [facility]   01/07/22 Telemedicine Karyl Kinnier, [place] [facility]   11/12/21 Telemedicine Neelagiri, Ardyth Man, [place] [facility]   10/20/21 Office Visit Neelagiri, Ardyth Man, MD [place] [facility]   Showing recent visits within past 365 days with a meds authorizing provider and meeting all other requirements  Future Appointments  No visits were found meeting these conditions.  Showing future appointments within next 365 days with a meds authorizing provider and meeting all other requirements       Labs: Cholesterol:   Cholesterol (mg/dL)   Date Value   16/10/9602 134     Cholesterol, Total (mg/dL)   Date Value   54/09/8117 238 (H)   ,   Triglycerides (mg/dL)   Date Value   14/78/2956 159 (H)   01/02/2015 150   ,   HDL (mg/dL)   Date Value   21/30/8657 45   01/02/2015 60   ,   LDL calculated (mg/dl)   Date Value   84/69/6295 149 (H)     LDL Calculated (mg/dL)   Date Value   28/41/3244 57    Potassium:   Potassium, Serum (mEq/L)   Date Value   12/19/2012 4.1     Potassium (mmol/L)   Date Value   04/01/2022 3.5   01/02/2015 3.9

## 2022-06-07 NOTE — Unmapped (Deleted)
Allergy/Immunology   Clinic Note  Return Patient       Assessment and Plan:   Diagnoses:  No diagnosis found.      Assessment and Plan:  Natalie [name] is a 52 y.o. female seen for the following:    Chronic spontaneous urticaria: this is not controlled with significant pruritus since stopping Xolair in November 2022. Had not been able to come in earlier to restart xolair (which was plan from 04/2021). Coming in today to restart xolair. Continues on Zyrtec 20mg  BID, which somewhat helps but not completely. Not associated with opioids that she is on for chronic pain.   - continue Zyrtec 20mg  BID  - restart Xolair 300mg  q4 weeks, restarted today  - patient will monitor more closely for possible triggers of pruritus every 6-8 weeks, and we will consider either shortening xolair interval (we had discussed this previously, and after 3 doses of xolair may consider q2 weeks, given BMI 31 and in the past she had had worsening of symptoms at the 3 week mark post xolair)    Allergic Rhinitis: previous testing serum positive for timothy grass. Will likely start SCIT for timothy grass, and would prefer to include all possible allergens.***  - return to clinic in 5 days off of antihistamines, for environmental skin prick testing  - after skin testing, increase astelin to 2 sprays per nostril BID  - continue Xhance 1sp/nostril BID  - continue astelin 2sp/nostril BID PRN  - continue optivar eye drops  - patient given information on cost of SCIT, will think about this and consider SCIT. Also given consent form to review  - monteleukast***    Follow-up:  No follow-ups on file.    --    I personally spent 15 minutes face-to-face and non-face-to-face in the care of this patient, which includes all pre, intra, and post visit time on the date of service.  All documented time was specific to the E/M visit and does not include any procedures that may have been performed.    Natalie [name], MS4  Allergy and Immunology  [facility]    Subjective:   Last Visit: 02/09/22  Chief Concern: recurrent pruritus  HPI:  I had the pleasure of seeing Natalie [name] in allergy/immunology clinic today, and she is a 52 y.o. female with allergic rhinitis, chronic spontaneous urticaria, on xolair, seen in follow up for chronic urticaria.    ***  Xolair in clinic Feb, now at home?    Patient was last seen 9 months ago, and had planned to restart xolair given she has continued to have daily hives on maximal antihistamines (and xolair had worked for her previously). She never was able to come back in to restart this, and so had not restarted it yet. Plans to restart the xolair today. Still has epipen. Also on zyrtec 2 pills in the morning and 2 pills at night. Having hives daily. Very itchy, all over her body. No swelling. No previous issues with xolair, no reactions.    Review of Systems:  As per HPI, all other systems reviewed are negative.    Past Medical History:     Past Medical History:   Diagnosis Date    Allergic conjunctivitis 11/23/2018    Anemia 1986    Arthritis     Breast mass     Chondromalacia of left knee     Chondromalacia of right knee     Depressive disorder     Fibrocystic breast  GERD (gastroesophageal reflux disease)     Hematuria     History of kidney stones     History of transfusion     Hyperlipidemia 1999    Hypothyroidism     Lumbar disc disease 1998    She was injured at work at the age of 6 and then had disk surgery 2 years later     Menopause ovarian failure     Menopause, premature     Migraine with aura     Neuromuscular disorder (CMS-HCC) 1999    Obesity (BMI 30-39.9) 11/23/2018    Ovarian cyst     Plantar fasciitis     Pseudotumor cerebri     Rash due to allergy 11/23/2018    Sickle cell trait (CMS-HCC)     Urinary tract infection     Vaginitis        Past Surgical History:   Procedure Laterality Date    BACK SURGERY  2001    BREAST BIOPSY Bilateral     When patient was 16 & 17 Both Negative    HYSTERECTOMY  2008    PR REMOVAL OF TONSILS,13+ Y/O Bilateral 10/10/2019    Procedure: TONSILLECTOMY;  Surgeon: Natalie Millard, MD;  Location: OR [facility];  Service: ENT    SALPINGOOPHORECTOMY Bilateral 2007    SPINE SURGERY  2001    TOTAL VAGINAL HYSTERECTOMY  01/04/2006    Uterine Prolapse-Dr. Leeanne [name] OP Note    TUBAL LIGATION  1995       Medications:     Current Outpatient Medications:     albuterol HFA 90 mcg/actuation inhaler, Inhale 2 puffs every four (4) hours as needed for wheezing., Disp: 54 g, Rfl: 0    azelastine (ASTELIN) 137 mcg (0.1 %) nasal spray, 2 sprays into each nostril two (2) times a day., Disp: 30 mL, Rfl: 0    azelastine (OPTIVAR) 0.05 % ophthalmic solution, Administer 2 drops to both eyes daily as needed., Disp: 6 mL, Rfl: 5    baclofen (LIORESAL) 20 MG tablet, 10 - 20 mg three times a day as needed for spasms., Disp: 90 tablet, Rfl: 2    cetirizine (ZYRTEC) 10 MG chewable tablet, Chew 1 tablet (10 mg total) at bedtime as needed for allergies., Disp: , Rfl:     diclofenac sodium (PENNSAID) 20 mg/gram /actuation(2 %) sopm, Apply 2 pumps to affected area up to four times a day., Disp: 112 g, Rfl: 2    dicyclomine (BENTYL) 20 mg tablet, TAKE 1 TABLET(20 MG) BY MOUTH THREE TIMES DAILY FOR UP TO 10 DAYS AS NEEDED, Disp: 30 tablet, Rfl: 0    empty container Misc, Use as directed to dispose of Xolair syringes, Disp: 1 each, Rfl: 2    EPINEPHrine (EPIPEN) 0.3 mg/0.3 mL injection, Inject 0.3 mL (0.3 mg total) into the muscle once as needed for anaphylaxis (Difficulty breathing, throat closing, etc) for up to 1 dose., Disp: 1 each, Rfl: 12    estradiol (ESTRACE) 0.5 MG tablet, TAKE 1 TABLET(0.5 MG) BY MOUTH DAILY, Disp: 30 tablet, Rfl: 1    famotidine (PEPCID) 20 MG tablet, Take 1 tablet (20 mg total) by mouth two (2) times a day., Disp: , Rfl:     gabapentin (NEURONTIN) 100 MG capsule, Take 1 capsule (100 mg total) by mouth in the morning., Disp: 90 capsule, Rfl: 0    gabapentin (NEURONTIN) 800 MG tablet, Take 1 tablet (800 mg total) by mouth two (2) times a day., Disp: 180 tablet, Rfl:  0    hydroCHLOROthiazide (HYDRODIURIL) 12.5 MG tablet, TAKE 1 TABLET(12.5 MG) BY MOUTH DAILY AS NEEDED FOR SWELLING, Disp: 30 tablet, Rfl: 5    montelukast (SINGULAIR) 10 mg tablet, Take 1 tablet (10 mg total) by mouth nightly., Disp: 30 tablet, Rfl: 2    naloxone (NARCAN) 4 mg nasal spray, One spray in either nostril once for known/suspected opioid overdose. May repeat every 2-3 minutes in alternating nostril til EMS arrives, Disp: 1 each, Rfl: 0    nortriptyline (PAMELOR) 25 MG capsule, Take 1 capsule (25 mg total) by mouth nightly., Disp: 90 capsule, Rfl: 1    omalizumab (XOLAIR) 150 mg/mL syringe, Inject the contents of 2 syringes (300 mg total) under the skin every twenty-eight (28) days., Disp: 2 mL, Rfl: 11    oxyCODONE-acetaminophen (PERCOCET) 10-325 mg per tablet, Take 1 tablet by mouth every six (6) hours as needed for pain. Max 4 tabs/day. Fill on or after: 06/06/22. Brand name only due to allergy., Disp: 120 tablet, Rfl: 0    [START ON 07/06/2022] oxyCODONE-acetaminophen (PERCOCET) 10-325 mg per tablet, Take 1 tablet by mouth every four (4) hours as needed for pain. Max 4 tabs/day. Fill on or after: 07/06/22. Brand name only due to allergy., Disp: 120 tablet, Rfl: 0    pantoprazole (PROTONIX) 40 MG tablet, Take 1 tablet (40 mg total) by mouth daily as needed (take it 30 mins prior to breakfast)., Disp: 30 tablet, Rfl: 3    potassium chloride 20 MEQ ER tablet, Take 1 tablet (20 mEq total) by mouth daily., Disp: 90 tablet, Rfl: 3    predniSONE (DELTASONE) 50 MG tablet, Take 1 tablet (50 mg total) by mouth daily., Disp: 5 tablet, Rfl: 0    PROCHAMBER Spcr, , Disp: , Rfl:     rizatriptan (MAXALT-MLT) 10 MG disintegrating tablet, Take 1 tablet by mouth at ONSET of migraine or aura. May repeat in 2 HOURS if needed. DO NOT EXCEED 2 doses/day, 2 days/week, 8 doses/month., Disp: 10 tablet, Rfl: 5    rosuvastatin (CRESTOR) 10 MG tablet, Take 1 tablet (10 mg total) by mouth nightly., Disp: 90 tablet, Rfl: 3    triamcinolone (KENALOG) 0.1 % cream, Apply 1 application topically two (2) times a day as needed. WHEN ALLERGIES FLARE-UP AND CAUSES RASH / ITCHING, Disp: 80 g, Rfl: 0    ubrogepant (UBRELVY) 50 mg tablet, Take 50 mg by mouth for headache. May repeat in 2 hours, if needed. LIMIT 2 PILLS PER DAY AND NO MORE THAN 4 PILLS PER WEEK., Disp: 16 tablet, Rfl: 3    XHANCE 93 mcg/actuation AerB, 1 spray into each nostril two (2) times a day as needed., Disp: 16 mL, Rfl: 11    Current Facility-Administered Medications:     omalizumab Geoffry Paradise) injection 300 mg, 300 mg, Subcutaneous, Q28 Days, Volertas, Sofija Dalia, MD, 300 mg at 03/09/22 1441    Allergies:     Allergies   Allergen Reactions    Buprenorphine Hcl Itching and Swelling    Pregabalin Swelling    Adhesive Tape-Silicones Itching     Band-aids ok.    Doxycycline Hyclate (Bulk) Nausea And Vomiting     GI Upset    Keflex [Cephalexin] Rash    Opioids - Morphine Analogues Itching    Oxycodone-Acetaminophen Itching and Nausea And Vomiting     GI Upset- GENERIC ONLY- able to take the brand name Percocets       Family History:     Family History   Problem  Relation Age of Onset    Heart attack Father     Mental illness Father     Asthma Father     Hypertension Mother     Heart disease Mother     Arthritis Mother     Depression Mother     Drug abuse Mother     Mental illness Mother     Ulcers Mother     COPD Mother     Heart attack Maternal Grandmother     Heart disease Maternal Grandmother     Hypertension Maternal Grandmother     Thyroid disease Maternal Grandmother     Breast cancer Cousin 76    Cancer Sister         lymphoma    COPD Maternal Aunt     Depression Son     Drug abuse Son     Mental illness Son     Stroke Paternal Grandfather     Thyroid disease Other     Alcohol abuse Neg Hx      Sister- allergic rhinitis  Children- allergic rhinitis  Father- asthma  Grandmother- Lyme, Thyroid disease Social History:     Social History     Tobacco Use    Smoking status: Never     Passive exposure: Past    Smokeless tobacco: Never   Vaping Use    Vaping status: Never Used   Substance Use Topics    Alcohol use: Never    Drug use: Never           Occupational History     Employer: NOT EMPLOYED       Objective:   PE:   There were no vitals filed for this visit.    There is no height or weight on file to calculate BMI.    General:  Well nourished female in no acute distress without toxic appearance.  Skin:  No eczematous patches or rash.   HEENT:  No conjunctival injection;   Anterior nasal examination: erythematous nasal mucosa (NOT PALE) mildly edematous inferior turbinates, no nasal septal deviation or visible polyps; granular oropharynx without exudates, ulcerations, or thrush; no frontal or maxillary sinus tenderness  Neck:  Supple with FROM.   CV: Regular rhythm; normal S1 and S2; no murmur, gallop or rub.  Respiratory:  Adequate inspiratory effort. Clear to auscultation bilaterally. No wheezes, crackles, or rhonchi.  Neurologic:  Alert and mental status appropriate for age  Musculoskeletal:  No peripheral edema.   Psychiatric: Relaxed, friendly, and cooperative with interview and examination. Euthymic affect. Normal thought process and thought content.    Investigations    Spirometry reviewed and pertinent for the following: None to review    Skin testing reviewed and pertinent for the following: None to review    Laboratory testing reviewed and pertinent for the following:    No visits with results within 4 Week(s) from this visit.   Latest known visit with results is:   Appointment on 05/07/2022   Component Date Value    Color, UA 05/07/2022 Light Yellow     Clarity, UA 05/07/2022 Clear     Specific Gravity, UA 05/07/2022 1.020     pH, UA 05/07/2022 7.0     Leukocyte Esterase, UA 05/07/2022 Negative     Nitrite, UA 05/07/2022 Negative     Protein, UA 05/07/2022 Negative     Glucose, UA 05/07/2022 Negative Ketones, UA 05/07/2022 Negative     Urobilinogen, UA 05/07/2022 <2.0 mg/dL     Bilirubin,  UA 05/07/2022 Negative     Blood, UA 05/07/2022 Small (A)     RBC, UA 05/07/2022 5 (H)     WBC, UA 05/07/2022 1     WBC Clumps 05/07/2022 None Seen     Squam Epithel, UA 05/07/2022 2     Bacteria, UA 05/07/2022 None Seen     Hyphal Yeast 05/07/2022 None Seen     Yeast, UA 05/07/2022 None Seen     Urine Collection Type 05/07/2022 Urine, Voided     Urine Culture, Comprehen* 05/07/2022 NO GROWTH      Cocklebur IgE <0.35 kUA/L <0.35     Cat dander IgE <0.35 kUA/L <0.35    Cottonwood (White Poplar) Tree IgE <0.35 kUA/L <0.35    Dog Dander IgE <0.35 kUA/L <0.35    D. farinae IgE <0.35 kUA/L <0.35    D. pteronyssinus IgE <0.35 kUA/L <0.35    Bahia Grass IgE <0.35 kUA/L <0.35    Johnson Grass IgE <0.35 kUA/L <0.35    Timothy Grass IgE <0.35 kUA/L 0.48 High     Alternaria alternata IgE <0.35 kUA/L <0.35    Candida Albicans IgE <0.35 kUA/L <0.35    Cladosporium Herbarum IgE <0.35 kUA/L <0.35    Epicoccum purpurascens IgE <0.35 kUA/L <0.35    Fusarium Proliferatum IgE <0.35 kUA/L <0.35    Aspergillus fumigatus IgE <0.35 kUA/L <0.35    Mucor Racemosus IgE <0.35 kUA/L <0.35    Aspergillus [place] IgE <0.35 kUA/L <0.35    Mouse IgE <0.35 kUA/L <0.35    P chrysogenum (P notatum) IgE <0.35 kUA/L <0.35    Rhizopus nigrican IgE <0.35 kUA/L <0.35    Trichophyton rubrum IgE <0.35 kUA/L <0.35    Setomelanomma rostrata (H. halodes) IgE <0.35 kUA/L <0.35    Mugwort IgE <0.35 kUA/L <0.35    Pigweed, Common IgE <0.35 kUA/L <0.35    English Plantain IgE <0.35 kUA/L <0.35    Giant Ragweed IgE <0.35 kUA/L <0.35    German Cockroach IgE <0.35 kUA/L <0.35    Sheep Sorrel IgE <0.35 kUA/L <0.35    Ragweed, short (common) IgE <0.35 kUA/L <0.35    White Ash Tree IgE <0.35 kUA/L <0.35    Beech (American) tree IgE <0.35 kUA/L <0.35    Birch (Common Silver) tree IgE <0.35 kUA/L <0.35    Box Elder Tree IgE <0.35 kUA/L <0.35    Elm Tree IgE <0.35 kUA/L <0.35 Maple Leaf Sycamore IgE <0.35 kUA/L <0.35    White Oak Tree IgE <0.35 kUA/L <0.35    Pecan Hickory IgE <0.35 kUA/L <0.35    Walnut Tree IgE <0.35 kUA/L <0.35    [place] Grass IgE <0.35 kUA/L <0.35    Goosefoot (Lamb's Quarters) IgE <0.35 kUA/L <0.35    Willow tree IgE <0.35 kUA/L <0.35     Imaging reviewed and pertinent for the following:     04/01/22 CT Abdomen/Pelvis: No acute process.   L5-S1 posterior fusion changes with intact hardware.  Severe L4-5 degenerative changes.  No suspicious bony lesions.   Mild ascending, descending and sigmoid colonic uncomplicated diverticulosis.    Hypodense steatotic liver.

## 2022-06-08 ENCOUNTER — Ambulatory Visit: Admit: 2022-06-08 | Discharge: 2022-06-09 | Payer: MEDICARE

## 2022-06-08 DIAGNOSIS — E782 Mixed hyperlipidemia: Principal | ICD-10-CM

## 2022-06-08 MED ORDER — FLUCONAZOLE 150 MG TABLET
ORAL_TABLET | Freq: Once | ORAL | 0 refills | 0 days | Status: CP | PRN
Start: 2022-06-08 — End: ?

## 2022-06-08 MED ORDER — TRIAMCINOLONE ACETONIDE 0.1 % TOPICAL CREAM
0 refills | 0 days | Status: CP
Start: 2022-06-08 — End: ?

## 2022-06-08 MED ORDER — AMOXICILLIN 875 MG-POTASSIUM CLAVULANATE 125 MG TABLET
ORAL_TABLET | Freq: Two times a day (BID) | ORAL | 0 refills | 10 days | Status: CP
Start: 2022-06-08 — End: 2022-06-18
  Filled 2022-06-09: qty 90, 90d supply, fill #0

## 2022-06-08 NOTE — Unmapped (Signed)
Name:  Natalie [name]  DOB: 11-10-1970  Today's Date: 06/11/2022  Age:  52 y.o.    Assessment/Plan:      Natalie [name] was seen today for follow-up.    Diagnoses and all orders for this visit:    Acute non-recurrent maxillary sinusitis  -     amoxicillin-clavulanate (AUGMENTIN) 875-125 mg per tablet; Take 1 tablet by mouth two (2) times a day for 10 days.    Bereavement counseling  -     Ambulatory referral to Social Work; Future    Acute vaginitis  -     fluconazole (DIFLUCAN) 150 MG tablet; Take 1 tablet (150 mg total) by mouth once as needed (vaginal candidiasis) for up to 1 dose.    Ingrown toenail of left foot  -     amoxicillin-clavulanate (AUGMENTIN) 875-125 mg per tablet; Take 1 tablet by mouth two (2) times a day for 10 days.    Encounter for screening mammogram for malignant neoplasm of breast  -     Mammo Digital Screening W Tomo Bilateral W CAD; Future      As she has also been taking Xhance nasal spray, I have advised her to discontinue over-the-counter Flonase nose spray.   Continue with other nasal sprays and allergy medications  Salt / warm water gargling, and saline nasal rinsing.   Will go ahead and treat her with augmentin for recurrent sinus infection. It could also help her toenail problem,    I have suggested her to soak the toe in warm water and epsom salt.   Advised him to try spreading the nail fold, and apply Neosporin Ointment.  Keep nail clean, pat it dry and apply Neosporin as needed.  She is encouraged to not cut her toenails too short to prevent future ingrown nails.     Updated mammogram and following up with [facility] is encouraged. Order placed.    Shingles and latest COVID vaccines recommended to be administered at local pharmacy.    Her cousin recently passed away in her early 59s, due to a heart attack.  She is open to see a counselor for bereavement counseling . Supportive listening offered today.    Diagnosis and plan along with any newly prescribed medication(s) were discussed in detail with this patient today. The patient verbalized understanding and agreed with the plan without language barriers or behavioral barriers to understanding unless otherwise noted.    Return in about 1 month (around 07/09/2022) for Annual physical.    Subjective:     HPI: Natalie [name] is a 52 y.o. female is here for    Chief Complaint   Patient presents with    Follow-up     Sinus infection and ingrown toenails     She reports that her cousin has unexpectedly passed yesterday at 23 yrs of age, due to heart attack. She has been grieving.    She presents with intense sinus pressure, pain in ears and headaches associated with a slight chill.   Admits to experiencing post nasal drip, yellow nasal drainage, and clear rhinorrhea when transitioning from outdoors to indoors.   Has been coughing up deep dark green phlegm  Says she has had a low grade temp.   Admits to slight wheezing/difficulty breathing. Uses albuterol x twice weekly at nights.    Uses Flonase, Xhance & Astelin prn.  Has been self-administering Xolair regularly.    Ingrown toenail on great toe of left foot. She has had an ingrown  in the same toe in the past many years ago.    Past Medical/Surgical History:     Past Medical History:   Diagnosis Date    Allergic conjunctivitis 11/23/2018    Anemia 1986    Arthritis     Breast mass     Chondromalacia of left knee     Chondromalacia of right knee     Depressive disorder     Fibrocystic breast     GERD (gastroesophageal reflux disease)     Hematuria     History of kidney stones     History of transfusion     Hyperlipidemia 1999    Hypothyroidism     Lumbar disc disease 1998    She was injured at work at the age of 52 and then had disk surgery 2 years later     Menopause ovarian failure     Menopause, premature     Migraine with aura     Neuromuscular disorder (CMS-HCC) 1999    Obesity (BMI 30-39.9) 11/23/2018    Ovarian cyst     Plantar fasciitis     Pseudotumor cerebri     Rash due to allergy 11/23/2018    Sickle cell trait (CMS-HCC)     Urinary tract infection     Vaginitis      Past Surgical History:   Procedure Laterality Date    BACK SURGERY  2001    BREAST BIOPSY Bilateral     When patient was 16 & 17 Both Negative    HYSTERECTOMY  2008    PR REMOVAL OF TONSILS,13+ Y/O Bilateral 10/10/2019    Procedure: TONSILLECTOMY;  Surgeon: Lona Millard, MD;  Location: OR [facility];  Service: ENT    SALPINGOOPHORECTOMY Bilateral 2007    SPINE SURGERY  2001    TOTAL VAGINAL HYSTERECTOMY  01/04/2006    Uterine Prolapse-Dr. Leeanne Rio OP Note    TUBAL LIGATION  1995       Family History:     Family History   Problem Relation Age of Onset    Hypertension Mother     Heart disease Mother     Arthritis Mother     Depression Mother     Drug abuse Mother     Mental illness Mother     Ulcers Mother     COPD Mother     Heart attack Father     Mental illness Father     Asthma Father     Cancer Sister         lymphoma    Heart attack Maternal Grandmother     Heart disease Maternal Grandmother     Hypertension Maternal Grandmother     Thyroid disease Maternal Grandmother     Stroke Paternal Grandfather     Depression Son     Drug abuse Son     Mental illness Son     COPD Maternal Aunt     Heart attack Cousin 61    Breast cancer Cousin 32    Thyroid disease Other     Alcohol abuse Neg Hx        Social History:     Social History     Socioeconomic History    Marital status: Single     Spouse name: None    Number of children: 2    Years of education: 15    Highest education level: None   Occupational History     Employer: NOT EMPLOYED   Tobacco  Use    Smoking status: Never     Passive exposure: Past    Smokeless tobacco: Never   Vaping Use    Vaping status: Never Used   Substance and Sexual Activity    Alcohol use: Never    Drug use: Never    Sexual activity: Yes     Partners: Male     Birth control/protection: Post-menopausal, Surgical     Comment: steady partner for 4 yrs, as on 04/03/19.   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, forensic)     Country of Origin - [place]                      Social Determinants of Health     Financial Resource Strain: Patient Declined (06/08/2022)    Overall Financial Resource Strain (CARDIA)     Difficulty of Paying Living Expenses: Patient declined   Food Insecurity: Patient Declined (06/08/2022)    Hunger Vital Sign     Worried About Running Out of Food in the Last Year: Patient declined     Ran Out of Food in the Last Year: Patient declined   Transportation Needs: Patient Declined (06/08/2022)    PRAPARE - Therapist, art (Medical): Patient declined     Lack of Transportation (Non-Medical): Patient declined       Allergies:     Buprenorphine hcl, Pregabalin, Buprenorphine, Cephalexin monohydrate, Doxycycline, Oxycodone hcl, Adhesive tape-silicones, Doxycycline hyclate (bulk), Keflex [cephalexin], Opioids - morphine analogues, and Oxycodone-acetaminophen    Current Medications:     Current Outpatient Medications   Medication Sig Dispense Refill    albuterol HFA 90 mcg/actuation inhaler Inhale 2 puffs every four (4) hours as needed for wheezing. 54 g 0    azelastine (ASTELIN) 137 mcg (0.1 %) nasal spray 2 sprays into each nostril two (2) times a day. 30 mL 0    azelastine (OPTIVAR) 0.05 % ophthalmic solution Administer 2 drops to both eyes daily as needed. 6 mL 5    baclofen (LIORESAL) 20 MG tablet 10 - 20 mg three times a day as needed for spasms. 90 tablet 2    cetirizine (ZYRTEC) 10 MG chewable tablet Chew 1 tablet (10 mg total) at bedtime as needed for allergies.      dicyclomine (BENTYL) 20 mg tablet TAKE 1 TABLET(20 MG) BY MOUTH THREE TIMES DAILY FOR UP TO 10 DAYS AS NEEDED 30 tablet 0    estradiol (ESTRACE) 0.5 MG tablet TAKE 1 TABLET(0.5 MG) BY MOUTH DAILY 30 tablet 1    famotidine (PEPCID) 20 MG tablet Take 1 tablet (20 mg total) by mouth two (2) times a day.      gabapentin (NEURONTIN) 800 MG tablet Take 1 tablet (800 mg total) by mouth two (2) times a day. 180 tablet 0    hydroCHLOROthiazide (HYDRODIURIL) 12.5 MG tablet TAKE 1 TABLET(12.5 MG) BY MOUTH DAILY AS NEEDED FOR SWELLING 30 tablet 5    montelukast (SINGULAIR) 10 mg tablet Take 1 tablet (10  mg total) by mouth nightly. 30 tablet 2    nortriptyline (PAMELOR) 25 MG capsule Take 1 capsule (25 mg total) by mouth nightly. 90 capsule 1    omalizumab (XOLAIR) 150 mg/mL syringe Inject the contents of 2 syringes (300 mg total) under the skin every twenty-eight (28) days. 2 mL 11    omalizumab (XOLAIR) 75 mg/0.5 mL syringe Inject under the skin.      pantoprazole (PROTONIX) 40 MG tablet Take 1 tablet (40 mg total) by mouth daily as needed (take it 30 mins prior to breakfast). 30 tablet 3    potassium chloride 20 MEQ ER tablet Take 1 tablet (20 mEq total) by mouth daily. 90 tablet 3    rizatriptan (MAXALT-MLT) 10 MG disintegrating tablet Take 1 tablet by mouth at ONSET of migraine or aura. May repeat in 2 HOURS if needed. DO NOT EXCEED 2 doses/day, 2 days/week, 8 doses/month. 10 tablet 5    rosuvastatin (CRESTOR) 10 MG tablet Take 1 tablet (10 mg total) by mouth nightly. 90 tablet 3    ubrogepant (UBRELVY) 50 mg tablet Take 50 mg by mouth for headache. May repeat in 2 hours, if needed. LIMIT 2 PILLS PER DAY AND NO MORE THAN 4 PILLS PER WEEK. 16 tablet 3    amoxicillin-clavulanate (AUGMENTIN) 875-125 mg per tablet Take 1 tablet by mouth two (2) times a day for 10 days. 20 tablet 0    diclofenac sodium (VOLTAREN) 1 % gel Apply 2 g topically four (4) times a day. 100 g 6    empty container Misc Use as directed to dispose of Xolair syringes 1 each 2    EPINEPHrine (EPIPEN) 0.3 mg/0.3 mL injection Inject 0.3 mL (0.3 mg total) into the muscle once as needed for anaphylaxis (Difficulty breathing, throat closing, etc) for up to 1 dose. 1 each 12    fluconazole (DIFLUCAN) 150 MG tablet Take 1 tablet (150 mg total) by mouth once as needed (vaginal candidiasis) for up to 1 dose. 2 tablet 0    naloxone (NARCAN) 4 mg nasal spray One spray in either nostril once for known/suspected opioid overdose. May repeat every 2-3 minutes in alternating nostril til EMS arrives 1 each 0    oxyCODONE-acetaminophen (PERCOCET) 10-325 mg per tablet Take 1 tablet by mouth every six (6) hours as needed for pain. Max 4 tabs/day. Fill on or after: 06/06/22. Brand name only due to allergy. 120 tablet 0    [START ON 07/06/2022] oxyCODONE-acetaminophen (PERCOCET) 10-325 mg per tablet Take 1 tablet by mouth every four (4) hours as needed for pain. Max 4 tabs/day. Fill on or after: 07/06/22. Brand name only due to allergy. 120 tablet 0    predniSONE (DELTASONE) 50 MG tablet Take 1 tablet (50 mg total) by mouth daily. 5 tablet 0    PROCHAMBER Spcr       rimegepant (NURTEC ODT) 75 mg TbDL Dissolve in the mouth.      triamcinolone (KENALOG) 0.1 % cream Apply 1 application topically two (2) times a day as needed. WHEN ALLERGIES FLARE-UP AND CAUSES RASH / ITCHING 80 g 0    XHANCE 93 mcg/actuation AerB 1 spray into each nostril two (2) times a day as needed. 16 mL 11     Current Facility-Administered Medications   Medication Dose Route Frequency Provider Last Rate Last Admin    omalizumab Geoffry Paradise) injection 300 mg  300 mg Subcutaneous Q28 Days Volertas, Sofija Dalia, MD   300 mg at 03/09/22 1441  ROS:     All ROS negative unless otherwise noted in HPI.    Vital Signs:     Wt Readings from Last 3 Encounters:   06/08/22 75.3 kg (166 lb)   05/17/22 74.9 kg (165 lb 3.2 oz)   04/09/22 75.8 kg (167 lb 3.2 oz)     Temp Readings from Last 3 Encounters:   06/08/22 36.3 ??C (97.4 ??F) (Temporal)   05/11/22 36.6 ??C (97.9 ??F) (Skin)   05/03/22 36.3 ??C (97.3 ??F) (Skin)     BP Readings from Last 3 Encounters:   06/08/22 118/80   05/17/22 124/77   05/11/22 129/78     Pulse Readings from Last 3 Encounters:   06/08/22 50   05/17/22 87   05/11/22 85     Body mass index is 32.42 kg/m??.    Objective:     Physical Exam  Vitals and nursing note reviewed.   Constitutional:       General: She is not in acute distress.     Appearance: Normal appearance. She is well-developed. She is not ill-appearing or diaphoretic.   HENT:      Right Ear: Tympanic membrane normal.      Left Ear: Tympanic membrane normal.      Nose: Congestion and rhinorrhea present.      Right Turbinates: Enlarged and swollen.      Left Turbinates: Enlarged and swollen.      Right Sinus: Maxillary sinus tenderness and frontal sinus tenderness present.      Left Sinus: Maxillary sinus tenderness and frontal sinus tenderness present.      Comments: 2+ turbinates bilaterally  Eyes:      General: No scleral icterus.  Neck:      Thyroid: No thyroid mass, thyromegaly or thyroid tenderness.      Vascular: No JVD.   Cardiovascular:      Rate and Rhythm: Normal rate and regular rhythm.      Heart sounds: Normal heart sounds.   Pulmonary:      Breath sounds: Normal breath sounds. No wheezing.   Musculoskeletal:      Right lower leg: No edema.      Left lower leg: No edema.   Feet:      Left foot:      Toenail Condition: Left toenails are ingrown.      Comments: Left sided toe on med aspect tenderness along nail border when palpated. Slight swelling present  Neurological:      Mental Status: She is alert and oriented to person, place, and time.      Cranial Nerves: Cranial nerves 2-12 are intact. No cranial nerve deficit.   Psychiatric:         Mood and Affect: Mood normal.         Behavior: Behavior normal.         Thought Content: Thought content normal.         Judgment: Judgment normal.       Labs:     No results found for this visit on 06/08/22.    I attest that I, Rayetta Humphrey, personally documented this note while acting as scribe for Royal Hawthorn, MD.      Rayetta Humphrey, Scribe.  06/08/2022     The documentation recorded by the scribe accurately reflects the service I personally performed and the decisions made by me.    Royal Hawthorn, MD

## 2022-06-08 NOTE — Unmapped (Signed)
Pain score - Neck and back - 6    Pain score - Left foot/toes - 7

## 2022-06-08 NOTE — Unmapped (Signed)
Clementeen Hoof Merleen Nicely 's Xolair shipment will be sent out  as a result of prior authorization now approved.     I have reached out to the patient  at [phone] and communicated the delivery change. We will reschedule the medication for the delivery date that the patient agreed upon.  We have confirmed the delivery date as 5/15, via same day courier.

## 2022-06-09 MED FILL — XOLAIR 150 MG/ML SUBCUTANEOUS SYRINGE: SUBCUTANEOUS | 28 days supply | Qty: 2 | Fill #3

## 2022-06-10 MED ORDER — DICLOFENAC 1 % TOPICAL GEL
Freq: Four times a day (QID) | TOPICAL | 6 refills | 13 days | Status: CP
Start: 2022-06-10 — End: 2023-06-10

## 2022-06-10 NOTE — Unmapped (Signed)
Suicide & Crisis Hotlines       30 Suicide and Crisis Lifeline  Call or text 19  Online Chat: www.988lifeline.org    The Hopeline   Phone: 619-362-1731    Online chat: www.hopeline.com    [place]   Phone: 1-800-SUICIDE ((315)587-3623)    National Suicide Prevention Lifeline    Phone: 9-562-130-QMVH (240-888-0789)   Online chat: www.suicidepreventionlifeline.org    Online chat (ages 52-24): https://gibson.com/        If you are experiencing a psychiatric emergency, you can call 911 or go to your nearest emergency room. Many counties have a specialized crisis center where you can walk in for a crisis assessment and referrals to additional services. Appointments are not needed, and they are open 24 hours a day, 7 days a week    24/7 Crisis Centers      [facility] (Adult patients only)  [address] [place] [address]  [phone]  Sunday - Saturday - 24 hours/day    [facility] (Adult and Pediatric patients)  [address] [place] [address]  [phone]  Sunday - Saturday - 24 hours/day    How to Find Mental Health Treatment   If you have health insurance, look on your insurance card for the phone number or website to find a list of mental health providers who accept your insurance.      If you have Medicaid, Medicare, or no insurance, see below for your county's MCO. Call the phone number to make an appointment or get more information.    If you have Medicaid, you can also get extra help finding services from the [facility] Office. Call them at [phone] from 8 a.m. to 5 p.m., Monday through Friday, except State holidays.      Mobile Crisis   Crisis situations are often best resolved at home. Mobile Crisis Teams are available 24 hours a day in all counties. Professional counselors will speak with you and your family during a visit. They have an average response time of 2 hours.     [employer] Served: [place], [place], [place], [place], [place], [place], [place]  Member and Recipient Services: [phone] - BH Crisis Line: [phone].    Website: AdSolver.gl  24/7 Mobile Crisis:    [employer] Crisis: [phone] - [phone]  [place], [place], [place], [place], [place], [place], [place])    [facility] Mobile Crisis: [phone]  [place], [place], [place], [place], [place], [place])    [facility] Recovery [employer] Crisis: [phone]  [facility])    Behavioral Health Urgent Care:    [facility]  [place], [place], [place], [place], [place], [place], [place])  Address: [address] [place], [place] [address]  Phone: [phone]  Hours: 24 hours a day    [facility] [place])  [place], [place], [place], [place], [place], [place], [place])  Address: [address], [place], [place] [address]   Phone: [phone]  Hours:  Monday-Thursday 8AM-10PM  Friday 8AM-8PM  Saturday-Sunday 8AM-5PM  (Last patients are registered one hour before closing.)      [place] Resources:    [place]---   [facility] [address]   Address: [address] (3rd & 4th Floor) [place], [place]   Phone: [phone]  Hours: M-W, Th-F 8am-3pm; Saturday and Sunday 8am-4pm  [phone] for 24/7    [facility] Recovery Response Center   Address: [address] [place], [place]  Phone: [phone]   Hours: 24 hours a day    [facility] --- MORES [facility] Response, Engagement, and Stabilization)  Address:  [address] [place], [place] [address]  Phone: [phone]  Hours: M-F 10AM-10:30PM  After hours call: [phone]  [facility] Recovery Services  [address] [place], [place]  Phone: [phone]  Hours: M-F 8am-3pm    [facility] Health Department  Address: [address] [place]. [place], [place] [address]  Phone: [phone]  Hours: M-F 8am-5pm    [place]---Crisis ED  [address] [place], [place] [address]  Phone: [phone]    Tawanna Sat [facility] Response, Engagement, and Stabilization)  Address:  [address] [place], [place] [address]  Phone: [phone]  Hours: M-F 10AM-10:30PM  After hours call: [phone]    [facility]  [address] Claris [place], [place]  Phone: [phone]  Hours: M-F 8am-5pm     [place]--RHA  [address] [place]   Phone: [phone]    [facility]  [address] [place], [place]  Phone: [phone]    [facility] Recovery   [address] Kendell [place], [place]   Phone: [phone]   Hours: 24 hours    [facility] --- MORES [facility] Response, Engagement, and Stabilization)  Address:  [address] [place], [place] [address]  Phone: [phone]  Hours: M-F 10AM-10:30PM  After hours call: [phone]      [facility] --- [facility] Response, Engagement, and Stabilization)  Address:  [address] [place], [place] [address]  Phone: [phone]  Hours: M-F 10AM-10:30PM  After hours call: [phone]      [facility] RESOURCES  Counties Served: [place], [place], [place], [place], [place], [place], [place], [place], [place], [place], [place], Engineer, maintenance (IT), [place], [place], [place], [place], [place], [place], [place], [place], [place], [place], [place], [employer], Orin, [place], [place]-Lynd[place], [place], [place], [place], [place], [place], [place], [place], [place], [place], [place], [place], [place], [place], [place], [place], [place], [place], Melida Quitter   Crisis Line: [phone]  Website: www.trilliumhealthresources.org    24/7/365 Mobile Crisis    Behavioral Health Crisis Line: [phone]  ([place], [place], [place], Tobias, [place], [place], [place], [place], [place], [place], [place]nes, Engineer, maintenance (IT), [place], [place], Flora, [place], Lone[place], [place], Cottage Grove, Gard[place], Deer Park, Big Coppitt [place], Ear[place], Whitesboro, Gulf[place], Tenn[place], [place], Winchester [place], [place], [place], Blue [place], Con[place], Agar, Ernest, Granton, Tharptown, Stanford, Aber[place], Clinton, M[place]ville, [place], Crescent, Linoma Beach, [place], [place]Bride, [place])    Hunter [employer] Crisis: [phone]  [place], [place], Neva Seat, Webb Silversmith)    Integrated [employer] Crisis: [phone]  Veto Kemps, La [place], Hunters Creek [place], Vernon, Pav[place], Orange [place], Roseland, Diamond Beach, Arcadia, Battle Lake, [place], [place], Lakeview, Gar[place], Concon[place], Pilgrim, Frederika, Holiday Lakes, Adamson, Milton, Etta, [name], Horseshoe Beach, Claryville, [place], Morgan, Colwich, Mill[place], Jones[place], Hope Mills, Gananda, Schall [place], Montezuma, Manteo, East [place], [place], Detroit [place], St. [place], [place], [place] Court [place], [place], North [place], Col[place], [place], Arial, [place])    [place] Crisis: [phone]  Gordy Levan, [place])    RHA Mobile Crisis [place]: [phone]  [place], [place], [place], Yetta Barre, Oyster [place], Beach[place], C[place] Bridge and [employer])      [place] Resources:    [facility] Recovery Services  [address] [place], [place]   Phone: [phone]  Hours: M-F 8am-3pm    [place]---DREAM  [address] [place], [place]  Phone: [phone]  Hours: M-F Mayra Reel - CALL [phone]    [facility]  [address] [place], [place]  Phone: [phone]  Hours:  M-W 8am to 5pm, Th: 8am to 8pm, F: 8am to [address] County---RHA  [address] [place], [place]  Phone: [phone]   Hours: M-F 8am-5pm    [place]--Helping Hand of [employer] [address] Rushie [place], [place]   Phone: [phone]  Hours: M-F 8-5    [facility]  [address] [place], [place]  Phone: [phone]  Hours: M-F, 8am to 3pm    [facility] County--[employer]  [address] Maxie Better, [place], [place]  Phone: [phone]  Hours: Chipper Oman to [place] - CALL [phone]    [place]---RHA  [address] [place], [place]  Phone: [phone] / 24/7 Mobile Crisis: [phone]  Hours: M-F 8am-5pm    Rosezena Sensor [phone]    [facility]  [address] [place], [place]  Phone: [phone] / 24/7 Mobile Crisis: [phone]  Hours: M-F 8am-3pm    Jewel Baize [place]---PORT  [address] [place], [place]   Phone: [phone]  Hours: M-F 8am-5pm    [place] - CALL [phone]    [place]---PORT  [address], [place], [place]  Phone: [phone]  Hours: M-F 8am to 5pm    [facility]  [address], [place]  Phone: [phone]  Hours: M-F 8am-5pm    [facility]  [address] [place], [place]  Phone: [phone]  Hours: M-F 8am-3pm    Kevan Ny - CALL [phone]    Melina Copa  [address] [facility] [address] Elby [place], [place]  Phone: [phone]   Hours: M-F 8am-3pm    [place]---[facility]arch  [address] [place], [place]   Phone: [phone]  Hours: M-F 8am-3pm    [place]--Family Service of the [place]  [address], [place]  Phone: [phone]  Hours: M-F 8am to 3pm    [place]--RHA  [address] [place], [place]  Phone: [phone]  Hours: M-F 8am to [facility]   [address] [place], [place]  Phone: [phone]  Hours: Irish Lack 8:30am to 3pm    [place]--CALL [phone]    [facility] Recovery Services  [address] [place], [place]  Phone: [phone]  Hours: M-F Para Skeans [phone]    Melba Coon [phone]     [facility] Recovery Services  [address] [place], [place]  Phone: [phone]  Hours: M-F 8am-3pm    [facility]  [address], [place], [place]  Phone: [phone]  Hours: M-F 8am-5pm    Charolette Forward [phone]    [facility] Recovery Services  [address] [place], [place]  Phone: [phone]  Hours: M-F 8am-3pm    Christell [facility] Recovery Services  [address] [place], [place]  Phone: [phone]  Hours: M-F 8am-3pm    Burt Ek  [address] [place], [place]  Phone: [phone]  Hours: M-F 8am to 1pm    [place]---PORT  [address] [place], [place]  [phone]  M-F 8to 5    [place]--Access Family Services  [address] [place], [place]  [phone]  M-F 8am to 6pm    [facility] of [employer]  [address], [place]  [phone]  M-F 8-5    [place]---CALL [phone]  [place]---PORT  [address] [place], [place]   Phone: [phone]    Janina Mayo of [place]  [address], [place], [place]  [phone]    [facility] Counseling  [address]  [phone]    [place] - CALL [phone]    [place]---PORT  [address] Seats [place], [place]  Phone: [phone]   Hours: M-F 8am to 5pm    [facility]  [address] [place], [place]  Phone: [phone]  Hours: M-F 8am to 5pm    [place]---PORT  [address] [place], [place]  Phone: [phone]  Hours: Wednesday 8am-5pm    Erlanger Bledsoe  [address], [place], [place]  Phone: [phone]  Hours: M 8-12, T-F, 1-5pm    [facility] Recovery Services  [address] [place], [place]  Phone: [phone]  Hours: M-F 8am-3pm    [facility] Recovery Services  [address] Albin [place], [place]  Phone: [phone]  Hours: M-F 8am to 3pm    [facility] Recovery Services  [address] [place], [place]  Phone: [phone]  Hours: M-F 8am-3pm    [facility]  [address] Salena Saner [place], [place]  Phone: [phone]   Hours: M-F 8am-3pm     [facility] Behavioral  [address] Driscilla [place], [place]   Phone: [phone]  Hours: M, W, F 8am to 11am     [facility] County---[facility]  [address] [place], [place]  Phone: [phone]  Hours: M-F 8am-11am, 1pm to 4pm     [place]---[facility]arch  [address] [place], [place]  Phone: [phone]  Hours: M-F Derek Jack - CALL [phone]    [place]  [address] [place]  Phone: [phone]    [place] - CALL [phone]    [facility]  [address] [place], [place]  Phone: [phone]  Hours: M-F 8am-5pm     Lucila Maine  [address] [place], [place]  Phone: [phone]  Hours: M-F 8am-3pm          [facility]  [place] Served: [place], [place], [place], [place], [place], [place], [place], [place], [place], [place], [place], [place], Tollie Eth  Crisis Line: [phone]  Website: [email or web address].org    24/7 Mobile Crisis:    [facility] Crisis: [phone]  Laurell Josephs, [place])    [facility] Recovery Services Mobile Crisis: [phone]  Marga Melnick, Cecil Cobbs, Berton Lan, [place], Renold Don, Vinson Moselle)    [facility] Crisis: [phone])  [facility], Leonette [facility]arch, & Magna)    [facility] 24/7 Crisis: [phone] [phone])  Laurell Josephs, [place], [place], [place], [place]ord[place]ony, [place], [place], [place], [place], R[place], [place], [place]fl[place], [place], [place])    [place] Resources:    [facility] [employer]  [address] [place], [place]  Phone: [phone] / 24/7 Mobile Crisis: [phone]  Hours: M-F 9am-3pm    [facility] Recovery Services  [address] Laurell Joseph[address] [place], [place]  Phone: [phone]   Hours: M-F 8am-8pm    [facility]  [address] [place], [place]  Phone: [phone]   Hours: M-F 9am-3pm    [place]---[facility]arch  [address]  [place], [place]  Phone: [phone]  Hours: M-F 8am-3pm    [place]---[facility] Recovery Services  [address] [place], [place]  Phone: [phone]  M, W, F 8am to 12pm    [facility] Recovery Services  [address], [place]  Phone: [phone] / 24/7 Mobile Crisis: [phone]  Hours: M-F 8am-8pm    [facility] Recovery  [address], [place], [place]  Phone: [phone] / 24/7 Mobile Crisis: [phone]  Hours: M-F 8am-5pm    Jamison Oka  [address] [place], [place]  Phone: [phone]  Hours: M-F 8am-3pm     Windy C[facility] Recovery Services  [address] Ninfa [place], [place]  Phone: [phone]   Hours: M-F 8am-3pm    [facility]  [address] [place], [place]  Phone: [phone]  Hours: M-F 8am-3pm    [place]--Family Preservation Services   [address] [place], [place]  Phone: [phone]    [facility] Recovery Services  [address] [place], [place]  Phone: [phone]   Hours: M-F 8am-8pm     Ena Daw[facility] Recovery Services  [address] Letitia Libra, [place]  Phone: [phone]   Hours: M-F 8am-1pm    [facility] Recovery Services  [address] [place]. [place], [place]   Phone: [phone] / 24/7 Mobile Crisis: [phone]  Hours: M-F 8am-8pm    [place]--[facility] Recovery Services  [address] [place], [place]   Phone: [phone]    [facility] Recovery Services  [address] [place], [place]  Phone: [phone]   Hours: M-F 8am-11:30am        [facility] Served: [place], Lyn Hollingshead, [place], [place]izabethtown, Ruth, [place], [place], Tul[place], Bendon, Ang[place], [place]onsburg, River [place]aks, Sylvan Beach, [place], [place], [place], [place], [place]e, [place], [place], [place]Bride, [place], [place]da, [place], [place], [place], Cecilie Kicks  Crisis Line: [phone]  Website: www.vayahealth.[email or web address]    24/7 Mobile Crisis:    [facility] Recovery Services Mobile Crisis: [phone]     ([address], [place], [place], [place], Roda Shutters, [place], [place], [place]on[place], [place], [place])    The [employer] Crisis Team [place]: [phone]  Lyn Hollings[place], [place], Roslyn [place], [place], Palm[place], [place], [place], [place], [place], [place])    [facility] Health Access to [employer] (Mobile Crisis Connection): [phone]  ([address], Lyn Hollingshead, Scientist, physiological, [place], [place]um [place], [place], Wilkinson, Wheatfields, Wilmington, Eagle [place], Lynwood, Summertown, Locust, Valley [place], Monona, Fayette[place], [place]. Ann High[place], [place]. [place], Rattan, [place], Lake [place], [place], Lu [place]ne, Artois, Hend[place], Thunder [place], [place], [place], West [place], [place], [place], [place])    [place] Resources:    [place]---RHA  [address] [place], [place]  Phone: [phone]  Hours: M,W,F 8am to 2pm    [facility]  [address], [place]  Phone: [phone]   Hours: M-F 8am-5pm    [facility] Recovery Services  [address] [place], [place]  Phone: [phone]   Hours: M-F 8am-5pm    [facility] Recovery Services  [address] [place], [place]  Phone: [phone]   Hours: M-F 8am-5pm    Denny Peon [facility] Recovery Services  [address], [place]  Phone: [phone]   Hours: M-F 8am-5pm    [facility] Preservation Services  [address], [place], [place]  Phone: [phone]  Hours: M-F 8am-5pm    [place]--RHA  [address]  Phone: [phone]  Hours: M-F 8:30am to 5:30pm     Paula Libra  [address], [place], [place]  Phone: [phone]   Hours: M-F 8am-5pm    [place]---RHA   [address] [place]  Phone: [phone]  Hours: M-Th, 8am to 3pm    [facility] Recovery Services  [address] [place], [place]   Phone: [phone]    [facility]---NCG/[facility]al[facility]ian Community Services  [address] [place], [place]  Phone: [phone]   Hours: M-F 9am-5pm    [facility]  [address] [place], [place]  Phone: [phone]   Hours: M-F 9am-5pm    [place]---[facility]  [address], [place], [place]  Phone: [phone]    [facility]  [address] [place], [place]  Phone: [phone]   Hours: M-F 9am-5pm    Ardine Eng [phone]  No Walk-In    [facility] County---NCG/Appalachian [employer] Solutions  [address] [place], [place]  Phone: [phone]  Hours: M-F 9am-5pm    [facility]  [address], [place]  Phone: [phone]  Hours: 8:30am to 5pm    [facility] Preservation Services  [address] [place], [place]  Phone: [phone]  Hours: M-F 9am-1pm    Andres Ege [facility]  [address] [employer], [place]  Phone: [phone]  Hours: M-F 9am-5pm    [facility]  [address], [place], [place]  Phone: [phone]   Hours: M-F 9am-5pm    [facility]  [address], [place], [place]  Phone: [phone]  Hours: M-F 9a-5p    [place]---RHA  [address] [place], [place]  Phone: [phone]   Hours: M-F 8am-5pm    Diona [place]---RHA  [address] [place], [place]   Phone: [phone]   Hours: M-F 8am-5pm    [facility] County---RHA  [address] [place], [place]  Phone: [phone]   Hours: M-F 9am-5pm    [place]--- CALL [phone] (No Walk-in)    [facility] Preservation Services  [address] [place], [place]   Phone: [phone]  Hours: M-F 9am-5pm    [facility]ace Recovery Services  [address] [place], [place]  Phone: [phone] / 24/7 Mobile Crisis: [phone]  Hours: M-F 8am-3pm    [facility] Recovery Services  [address] [place], [place]  Phone: [phone]    [facility] Recovery Services  [address] [place], [place]  Phone: [phone]   Hours: M-F 8am-8pm    [facility] Recovery Services  [address] [place], [place]  Phone: [phone]  Hours: M, W 11:30am to 1:30, F 8am to 12pm    [facility]  [address] [place], [place]  Phone: [phone]   Hours: M-F 9am-5pm    Francee Piccolo [facility]  [address] [place]  Phone: [phone]  Hours: M-F 9a to 5p    [facility] Recovery Services  [address] [place] [place], [place]  Phone: [phone]   Hours: M-F 8am-5pm    Faylene [place]--Recovery Innovations  [address] Orson [place], [place]  Phone: [phone]    [facility] Recovery Services  [address], [place], [place]  Phone: [phone]   Hours: M-F 8am-5pm    [facility] Recovery Services  [address] [place], [place]  Phone: [phone]   Hours: M-F 8am-5pm     [place]resa [place]---RHA  [address] Tessie [place], [place]  Phone: [phone]   Hours: M-F 9a-5p

## 2022-06-11 DIAGNOSIS — E7800 Pure hypercholesterolemia, unspecified: Principal | ICD-10-CM

## 2022-06-11 DIAGNOSIS — E876 Hypokalemia: Principal | ICD-10-CM

## 2022-06-11 MED ORDER — ROSUVASTATIN 10 MG TABLET
ORAL_TABLET | Freq: Every evening | ORAL | 3 refills | 90 days
Start: 2022-06-11 — End: ?

## 2022-06-11 MED ORDER — POTASSIUM CHLORIDE ER 20 MEQ TABLET,EXTENDED RELEASE(PART/CRYST)
ORAL_TABLET | Freq: Every day | ORAL | 3 refills | 90 days
Start: 2022-06-11 — End: ?

## 2022-06-11 NOTE — Unmapped (Signed)
Pre-procedure call made and RN spoke with patient    Recent infection/antibiotics. Denied    Are you diabetic? No Monitor Blood Glucose Daily? No    Taking any recent blood thinners/NSAIDs. No    Any electronic implants No    Pregnancy NA    Driver necessary No    Patient informed to arrive 30 minutes before procedure appointment time.  Patient verbalized understanding to all.

## 2022-06-11 NOTE — Unmapped (Signed)
Duplicate request. Sent on 06/07/22.

## 2022-06-16 ENCOUNTER — Institutional Professional Consult (permissible substitution): Admit: 2022-06-16 | Discharge: 2022-06-17 | Payer: MEDICARE | Attending: Clinical | Primary: Clinical

## 2022-06-16 DIAGNOSIS — Z7189 Other specified counseling: Principal | ICD-10-CM
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
## 2022-06-18 ENCOUNTER — Telehealth: Admit: 2022-06-18 | Discharge: 2022-06-19 | Payer: MEDICARE | Attending: Psychologist | Primary: Psychologist

## 2022-06-18 DIAGNOSIS — F4321 Adjustment disorder with depressed mood: Principal | ICD-10-CM

## 2022-06-18 DIAGNOSIS — F1190 Opioid use, unspecified, uncomplicated: Principal | ICD-10-CM

## 2022-06-18 DIAGNOSIS — F419 Anxiety disorder, unspecified: Principal | ICD-10-CM

## 2022-06-18 NOTE — Unmapped (Signed)
[name]   Confidential Psychological Therapy Session    Patient Name: Natalie [name]  Medical Record Number: [id number]  Date of Service: Jun 18, 2022  Attending Psychologist: Caroline More, PhD  CPT Procedure Code: 81191 for 60 minutes of face to face counseling    This visit was performed face to face with interactive technology using a HIPPA compliant audio/visual platform. We reviewed confidentiality today. The patient was present in [place], a state in which this provider is licensed and able to provide care (location and contact information confirmed), attended this visit alone, and consented to this virtual pain psychology visit.    REFERRING PHYSICIAN: Criss Rosales, MD    CHIEF COMPLAINT AND REASON FOR VISIT:  COM follow up evaluation/pain coping skills; CBT/ACT for pain; grief    SUBJECTIVE / HISTORY OF PRESENT ILLNESS: [name] is a very pleasant 52 y.o.  female with chronic lower back pain, chronic headaches, and myofascial pain. Her pain started in 1999 in her lower back.  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  She had a spinal fusion in 2001 at the L5-S1 level.  She has had bilateral shoulder pain, neck pain, lower back pain, and b/l knees.  She also described a radiculopathy b/l that goes from her back to her bilateral buttocks and down to her toes.      Pt initially established with Dr. Oda Kilts in 08/2016 and then was was first evaluated by me in 07/2017. Last follow up with me was 05/11/2022. Patient participates actively. Her cousin died unexpectedly 1.5 weeks ago (sister of other cousin that passed this last year). Most recent loss was from MI. Erasmo Score was this past Monday, on her son's birthday. Pt processes thoughts and feelings about loss and grief. Discusses changes in family dynamics related to the loss and conflict.     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 Would do both - notes she loves it and it helps her stay healthy overall.     Pt's PCP set her up with grief counseling recently. Pt says that in that counseling she learned that both cousins lived in the same house and that she believes the house was haunted by a spirit. Pt processes how that impacts her. Reviewed evidence for and against possibility of a spirit.     Pt taking a rest day today. Discusses how she uses these rest days to balance her activity levels on other days. Spent extra time retouched on ARC and pacing and how rest is enjoyable after periods of activity.     Patient reports good adherence to pain contract. Cancelled last med management visit with Dr. Oda Kilts due to her needing to take her uncle to the [place] for chemo. Pt noted she cancelled with notice (1 week before, rescheduled out so will not have enough opioid pain medication to get through. Planned for next time - sister would need to take uncle next time). Recent TPIs cancelled due to Medicare not authorizing.    OBJECTIVE / MENTAL STATUS:    Appearance:   Appears stated age and Clean/Neat   Motor:  No abnormal movements   Speech/Language:   Normal rate, volume, tone, fluency   Mood:  Euthymic, improved   Affect:  Bright! Mood congruent   Thought process:  Logical, linear, clear, coherent, goal directed   Thought content:    Denies  SI, HI, self harm, delusions, obsessions, paranoid ideation, or ideas of reference   Perceptual disturbances:    Denies auditory and visual hallucinations, behavior not concerning for response to internal stimuli   Orientation:  Oriented to person, place, time, and general circumstances   Attention:  Able to fully attend without fluctuations in consciousness   Concentration:  Able to fully concentrate and attend   Memory:  Immediate, short-term, long-term, and recall grossly intact    Fund of knowledge:   Consistent with level of education and development   Insight:    Fair Judgment:   Intact   Impulse Control:  Intact       DIAGNOSTIC IMPRESSION:   Anxiety NOS  Chronic continuou use of opiates  Grief    ASSESSMENT:   [name] is a very pleasant 52 y.o.  female from [place], [place] with chronic lower back pain, chronic headaches, and myofascial pain. Her pain started in 1999 in her lower back.  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  She had a spinal fusion in 2001 at the L5-S1 level.  She has had bilateral shoulder pain, neck pain, lower back pain, and b/l knees.  She also described a radiculopathy b/l that goes from her back to her bilateral buttocks and down to her toes.  The patient is currently considered to be high risk due to prior nonadherence, but appropriate with a behavioral adherence plan in place.  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 She continues to participate actively in psychotherapy.    PLAN:   (1) COM - high risk but remains appropriate with a behavioral adherence plan also in place.    -Patient MUST meet with pain psychology/me once a month.   -No additional overuse of opioids will be tolerated.    -Patient should always bring her medication to clinic for pill counts.  -Patient was informed she has had numerous infractions with respect to her pain contract over the years, and that no further infractions will be tolerated.    If any additional pain contract or behavioral adherence contract infractions occur, I recommend stopping opioids.    Pt doing well with adherence, per our last few visits.     (2) Pain coping skills- addressed compliance, as well as pacing and mindful breathing, body scan, and problem solving    (3) follow-in 1 month

## 2022-06-22 ENCOUNTER — Telehealth: Admit: 2022-06-22 | Discharge: 2022-06-23 | Payer: MEDICARE | Attending: Internal Medicine | Primary: Internal Medicine

## 2022-06-22 DIAGNOSIS — L509 Urticaria, unspecified: Principal | ICD-10-CM

## 2022-06-22 DIAGNOSIS — L299 Pruritus, unspecified: Principal | ICD-10-CM

## 2022-06-22 DIAGNOSIS — L501 Idiopathic urticaria: Principal | ICD-10-CM

## 2022-06-22 MED ORDER — XOLAIR 150 MG/ML SUBCUTANEOUS SYRINGE
SUBCUTANEOUS | 11 refills | 14 days | Status: CP
Start: 2022-06-22 — End: ?
  Filled 2022-06-25: qty 4, 28d supply, fill #0

## 2022-06-22 NOTE — Unmapped (Signed)
We will plan to increase xolair to every 2 weeks.

## 2022-06-22 NOTE — Unmapped (Signed)
Allergy/Immunology   Clinic Note  Return Patient       Assessment and Plan:   Diagnoses:  1. Chronic idiopathic urticaria    2. Urticaria    3. Pruritus        Assessment and Plan:  Natalie [name] is a 52 y.o. female seen for the following:    Chronic spontaneous urticaria: refractory pruritus - this is not controlled with significant pruritus on the fourth week after getting xolair. Continues xolair 300mg  q28 days, and cetirizine 20mg  BID. This has significantly improved her hives and itching, and has improvement for the first 3 weeks after xolair, but then the last week of xolair is severe with a UAS7 score of 21 (all from intense/severe itching daily).   - continue Zyrtec 20mg  BID  - increase Xolair 300mg  q2 weeks, especially given her BMI is 32, and previous studies have shown increased need of increasing dosing of Xolair at higher BMIs (reference 1)    Follow-up:  Return in about 3 months (around 09/22/2022).    --    Trixie Dredge, MD  Allergy and Immunology  [facility]    References:  Jacelyn Grip, Z., Kocat??rk, E. et al. Omalizumab Updosing in Chronic Spontaneous Urticaria: an Overview of Real-World Evidence. Clinic Rev Allerg Immunol [phone], [phone] (2020). [email or web address]       The patient reports they are physically located in [place] and is currently: at home. I conducted a audio/video visit. I spent  23m 38s on the video call with the patient. I spent an additional 5 minutes on pre- and post-visit activities on the date of service .       Subjective:   Last Visit: 02/09/22  Chief Concern: recurrent pruritus/urticaria  HPI:  I had the pleasure of seeing Natalie [name] in allergy/immunology clinic today, and she is a 52 y.o. female with allergic rhinitis, chronic spontaneous urticaria, on xolair, seen in follow up for chronic urticaria.    Saw patient last in Jan 2024, got Xolair at that visit for worsening urticaria/pruritus. Then had last visit for xolair was 03/09/22. Giving it to herself at home. Gets it on the 13th of every month. Works for about 3 weeks, then severe ithcing comes back. Also continues on zyrtec 2 pills in the morning and 2 pills at night. More recently, notices has more hives/itching when in heat/working in the yard. But this is more from triggers. Other than that, works well until the last week of the xolair when itching is really bad - rates itching score at a 3 for every day of the last week. No angioedema. No previous issues with xolair, no reactions.    Review of Systems:  As per HPI, all other systems reviewed are negative.    Past Medical History:     Past Medical History:   Diagnosis Date    Allergic conjunctivitis 11/23/2018    Anemia 1986    Arthritis     Breast mass     Chondromalacia of left knee     Chondromalacia of right knee     Depressive disorder     Fibrocystic breast     GERD (gastroesophageal reflux disease)     Hematuria     History of kidney stones     History of transfusion     Hyperlipidemia 1999    Hypothyroidism     Lumbar disc disease 1998    She was injured at work at the age of 8 and then had  disk surgery 2 years later     Menopause ovarian failure     Menopause, premature     Migraine with aura     Neuromuscular disorder (CMS-HCC) 1999    Obesity (BMI 30-39.9) 11/23/2018    Ovarian cyst     Plantar fasciitis     Pseudotumor cerebri     Rash due to allergy 11/23/2018    Sickle cell trait (CMS-HCC)     Urinary tract infection     Vaginitis        Past Surgical History:   Procedure Laterality Date    BACK SURGERY  2001    BREAST BIOPSY Bilateral     When patient was 16 & 17 Both Negative    HYSTERECTOMY  2008    PR REMOVAL OF TONSILS,12+ Y/O Bilateral 10/10/2019    Procedure: TONSILLECTOMY;  Surgeon: Lona Millard, MD;  Location: OR [place];  Service: ENT    SALPINGOOPHORECTOMY Bilateral 2007    SPINE SURGERY  2001    TOTAL VAGINAL HYSTERECTOMY  01/04/2006    Uterine Prolapse-Dr. Leeanne Rio OP Note TUBAL LIGATION  1995       Medications:     Current Outpatient Medications:     albuterol HFA 90 mcg/actuation inhaler, Inhale 2 puffs every four (4) hours as needed for wheezing., Disp: 54 g, Rfl: 0    azelastine (ASTELIN) 137 mcg (0.1 %) nasal spray, 2 sprays into each nostril two (2) times a day., Disp: 30 mL, Rfl: 0    azelastine (OPTIVAR) 0.05 % ophthalmic solution, Administer 2 drops to both eyes daily as needed., Disp: 6 mL, Rfl: 5    baclofen (LIORESAL) 20 MG tablet, 10 - 20 mg three times a day as needed for spasms., Disp: 90 tablet, Rfl: 2    cetirizine (ZYRTEC) 10 MG chewable tablet, Chew 1 tablet (10 mg total) at bedtime as needed for allergies., Disp: , Rfl:     diclofenac sodium (VOLTAREN) 1 % gel, Apply 2 g topically four (4) times a day., Disp: 100 g, Rfl: 6    dicyclomine (BENTYL) 20 mg tablet, TAKE 1 TABLET(20 MG) BY MOUTH THREE TIMES DAILY FOR UP TO 10 DAYS AS NEEDED, Disp: 30 tablet, Rfl: 0    empty container Misc, Use as directed to dispose of Xolair syringes, Disp: 1 each, Rfl: 2    EPINEPHrine (EPIPEN) 0.3 mg/0.3 mL injection, Inject 0.3 mL (0.3 mg total) into the muscle once as needed for anaphylaxis (Difficulty breathing, throat closing, etc) for up to 1 dose., Disp: 1 each, Rfl: 12    estradiol (ESTRACE) 0.5 MG tablet, TAKE 1 TABLET(0.5 MG) BY MOUTH DAILY, Disp: 30 tablet, Rfl: 1    famotidine (PEPCID) 20 MG tablet, Take 1 tablet (20 mg total) by mouth two (2) times a day., Disp: , Rfl:     fluconazole (DIFLUCAN) 150 MG tablet, Take 1 tablet (150 mg total) by mouth once as needed (vaginal candidiasis) for up to 1 dose., Disp: 2 tablet, Rfl: 0    gabapentin (NEURONTIN) 800 MG tablet, Take 1 tablet (800 mg total) by mouth two (2) times a day., Disp: 180 tablet, Rfl: 0    hydroCHLOROthiazide (HYDRODIURIL) 12.5 MG tablet, TAKE 1 TABLET(12.5 MG) BY MOUTH DAILY AS NEEDED FOR SWELLING, Disp: 30 tablet, Rfl: 5    montelukast (SINGULAIR) 10 mg tablet, Take 1 tablet (10 mg total) by mouth nightly., Disp: 30 tablet, Rfl: 2    naloxone (NARCAN) 4 mg nasal spray, One spray in  either nostril once for known/suspected opioid overdose. May repeat every 2-3 minutes in alternating nostril til EMS arrives, Disp: 1 each, Rfl: 0    nortriptyline (PAMELOR) 25 MG capsule, Take 1 capsule (25 mg total) by mouth nightly., Disp: 90 capsule, Rfl: 1    omalizumab (XOLAIR) 150 mg/mL syringe, Inject the contents of 2 syringes (300 mg total) under the skin every twenty-eight (28) days., Disp: 2 mL, Rfl: 11    omalizumab (XOLAIR) 75 mg/0.5 mL syringe, Inject under the skin., Disp: , Rfl:     oxyCODONE-acetaminophen (PERCOCET) 10-325 mg per tablet, Take 1 tablet by mouth every six (6) hours as needed for pain. Max 4 tabs/day. Fill on or after: 06/06/22. Brand name only due to allergy., Disp: 120 tablet, Rfl: 0    [START ON 07/06/2022] oxyCODONE-acetaminophen (PERCOCET) 10-325 mg per tablet, Take 1 tablet by mouth every four (4) hours as needed for pain. Max 4 tabs/day. Fill on or after: 07/06/22. Brand name only due to allergy., Disp: 120 tablet, Rfl: 0    pantoprazole (PROTONIX) 40 MG tablet, Take 1 tablet (40 mg total) by mouth daily as needed (take it 30 mins prior to breakfast)., Disp: 30 tablet, Rfl: 3    potassium chloride 20 MEQ ER tablet, Take 1 tablet (20 mEq total) by mouth daily., Disp: 90 tablet, Rfl: 3    predniSONE (DELTASONE) 50 MG tablet, Take 1 tablet (50 mg total) by mouth daily., Disp: 5 tablet, Rfl: 0    PROCHAMBER Spcr, , Disp: , Rfl:     rimegepant (NURTEC ODT) 75 mg TbDL, Dissolve in the mouth., Disp: , Rfl:     rizatriptan (MAXALT-MLT) 10 MG disintegrating tablet, Take 1 tablet by mouth at ONSET of migraine or aura. May repeat in 2 HOURS if needed. DO NOT EXCEED 2 doses/day, 2 days/week, 8 doses/month., Disp: 10 tablet, Rfl: 5    rosuvastatin (CRESTOR) 10 MG tablet, Take 1 tablet (10 mg total) by mouth nightly., Disp: 90 tablet, Rfl: 3    triamcinolone (KENALOG) 0.1 % cream, Apply 1 application topically two (2) times a day as needed WHEN ALLERGIES FLARE-UP AND CAUSES RASH / ITCHING, Disp: 80 g, Rfl: 0    ubrogepant (UBRELVY) 50 mg tablet, Take 50 mg by mouth for headache. May repeat in 2 hours, if needed. LIMIT 2 PILLS PER DAY AND NO MORE THAN 4 PILLS PER WEEK., Disp: 16 tablet, Rfl: 3    XHANCE 93 mcg/actuation AerB, 1 spray into each nostril two (2) times a day as needed., Disp: 16 mL, Rfl: 11    Current Facility-Administered Medications:     omalizumab Geoffry Paradise) injection 300 mg, 300 mg, Subcutaneous, Q28 Days, Fiora Weill Dalia, MD, 300 mg at 03/09/22 1441    Allergies:     Allergies   Allergen Reactions    Buprenorphine Hcl Itching and Swelling    Pregabalin Swelling    Buprenorphine     Cephalexin Monohydrate     Doxycycline     Oxycodone Hcl     Adhesive Tape-Silicones Itching     Band-aids ok.    Doxycycline Hyclate (Bulk) Nausea And Vomiting     GI Upset    Keflex [Cephalexin] Rash    Opioids - Morphine Analogues Itching    Oxycodone-Acetaminophen Itching and Nausea And Vomiting     GI Upset- GENERIC ONLY- able to take the brand name Percocets       Family History:     Family History   Problem Relation Age of  Onset    Hypertension Mother     Heart disease Mother     Arthritis Mother     Depression Mother     Drug abuse Mother     Mental illness Mother     Ulcers Mother     COPD Mother     Heart attack Father     Mental illness Father     Asthma Father     Cancer Sister         lymphoma    Heart attack Maternal Grandmother     Heart disease Maternal Grandmother     Hypertension Maternal Grandmother     Thyroid disease Maternal Grandmother     Stroke Paternal Grandfather     Depression Son     Drug abuse Son     Mental illness Son     COPD Maternal Aunt     Heart attack Cousin 37    Breast cancer Cousin 32    Thyroid disease Other     Alcohol abuse Neg Hx      Sister- allergic rhinitis  Children- allergic rhinitis  Father- asthma  Grandmother- Lyme, Thyroid disease     Social History:     Social History Tobacco Use    Smoking status: Never     Passive exposure: Past    Smokeless tobacco: Never   Vaping Use    Vaping status: Never Used   Substance Use Topics    Alcohol use: Never    Drug use: Never           Occupational History     Employer: NOT EMPLOYED       Objective:   PE:   General: Well-appearing, non-diaphoretic, in no acute distress. Appropriate build for age.  HEENT: atraumatic, normocephalic. Extraocular muscles intact. NO ocular redness, discharge, swelling or lesions. NO Nasal redness, swelling, discharge, deformity or impetigo/crusting. Respiratory: No increased respiratory effort, non-labored breathing. No flaring/accessory muscle usage on deep inspiration. No cough. Speaking in clear full sentences. I:E ratio within normal limits.  Cutaneous: no visible rashes, marks or bruises. No visible flushing or pallor. No cyanosis noted on face or hands.  Neuro: Cranial nerves grossly normal. Speech normal rate and rhythm. Awake/alert. Orientation arrive to appointment on time with no prompting. Moving both upper extremities equally. Gross motor function intact.  Psych: pleasant mood, normal affect. Well-groomed. Cooperative, appropriate thought content. Attention focused, linear, attends for longer periods of time.    Investigations    Spirometry reviewed and pertinent for the following: None to review    Skin testing reviewed and pertinent for the following: None to review    Laboratory testing reviewed and pertinent for the following:    No visits with results within 4 Week(s) from this visit.   Latest known visit with results is:   Appointment on 05/07/2022   Component Date Value    Color, UA 05/07/2022 Light Yellow     Clarity, UA 05/07/2022 Clear     Specific Gravity, UA 05/07/2022 1.020     pH, UA 05/07/2022 7.0     Leukocyte Esterase, UA 05/07/2022 Negative     Nitrite, UA 05/07/2022 Negative     Protein, UA 05/07/2022 Negative     Glucose, UA 05/07/2022 Negative     Ketones, UA 05/07/2022 Negative Urobilinogen, UA 05/07/2022 <2.0 mg/dL     Bilirubin, UA 09/81/1914 Negative     Blood, UA 05/07/2022 Small (A)     RBC, UA 05/07/2022 5 (H)  WBC, UA 05/07/2022 1     WBC Clumps 05/07/2022 None Seen     Squam Epithel, UA 05/07/2022 2     Bacteria, UA 05/07/2022 None Seen     Hyphal Yeast 05/07/2022 None Seen     Yeast, UA 05/07/2022 None Seen     Urine Collection Type 05/07/2022 Urine, Voided     Urine Culture, Comprehen* 05/07/2022 NO GROWTH      Cocklebur IgE <0.35 kUA/L <0.35     Cat dander IgE <0.35 kUA/L <0.35    Cottonwood (White Poplar) Tree IgE <0.35 kUA/L <0.35    Dog Dander IgE <0.35 kUA/L <0.35    D. farinae IgE <0.35 kUA/L <0.35    D. pteronyssinus IgE <0.35 kUA/L <0.35    Bahia Grass IgE <0.35 kUA/L <0.35    Johnson Grass IgE <0.35 kUA/L <0.35    Timothy Grass IgE <0.35 kUA/L 0.48 High     Alternaria alternata IgE <0.35 kUA/L <0.35    Candida Albicans IgE <0.35 kUA/L <0.35    Cladosporium Herbarum IgE <0.35 kUA/L <0.35    Epicoccum purpurascens IgE <0.35 kUA/L <0.35    Fusarium Proliferatum IgE <0.35 kUA/L <0.35    Aspergillus fumigatus IgE <0.35 kUA/L <0.35    Mucor Racemosus IgE <0.35 kUA/L <0.35    Aspergillus [place] IgE <0.35 kUA/L <0.35    Mouse IgE <0.35 kUA/L <0.35    P chrysogenum (P notatum) IgE <0.35 kUA/L <0.35    Rhizopus nigrican IgE <0.35 kUA/L <0.35    Trichophyton rubrum IgE <0.35 kUA/L <0.35    Setomelanomma rostrata (H. halodes) IgE <0.35 kUA/L <0.35    Mugwort IgE <0.35 kUA/L <0.35    Pigweed, Common IgE <0.35 kUA/L <0.35    English Plantain IgE <0.35 kUA/L <0.35    Giant Ragweed IgE <0.35 kUA/L <0.35    German Cockroach IgE <0.35 kUA/L <0.35    Sheep Sorrel IgE <0.35 kUA/L <0.35    Ragweed, short (common) IgE <0.35 kUA/L <0.35    White Ash Tree IgE <0.35 kUA/L <0.35    Beech (American) tree IgE <0.35 kUA/L <0.35    Birch (Common Silver) tree IgE <0.35 kUA/L <0.35    Box Elder Tree IgE <0.35 kUA/L <0.35    Elm Tree IgE <0.35 kUA/L <0.35    Maple Leaf Sycamore IgE <0.35 kUA/L <0.35    White Oak Tree IgE <0.35 kUA/L <0.35    Pecan Hickory IgE <0.35 kUA/L <0.35    Walnut Tree IgE <0.35 kUA/L <0.35    [place] Grass IgE <0.35 kUA/L <0.35    Goosefoot (Lamb's Quarters) IgE <0.35 kUA/L <0.35    Willow tree IgE <0.35 kUA/L <0.35     Imaging reviewed and pertinent for the following:   Impression   Unchanged benign lymph nodes in the LEFT neck.       Signed (Electronic Signature): 05/08/2021 1:01 PM   Signed By: Leighton Roach, MD       Narrative   Exam:  US SOFT TISSUE HEAD AND NECK       History:  LEFT neck adenopathy.       Technique:  US SOFT TISSUE HEAD AND NECK       Comparison:  08/08/2020. 05/19/2020.       Findings:  Focused sonographic evaluation over the area of palpable concern in the LEFT neck was performed   . Scattered benign lymph nodes are again noted in the LEFT neck. No interval change.

## 2022-06-23 NOTE — Unmapped (Signed)
[name] Refill Coordination Note    Specialty Medication(s) to be Shipped:   CF/Pulmonary/Asthma: Xolair 150mg /ml    Other medication(s) to be shipped: No additional medications requested for fill at this time     Natalie [name], DOB: February 16, 1970  Phone: There are no phone numbers on file.      All above HIPAA information was verified with patient.     Was a Nurse, learning disability used for this call? No    Completed refill call assessment today to schedule patient's medication shipment from the [facility] [phone]).  All relevant notes have been reviewed.     Specialty medication(s) and dose(s) confirmed: Patient reports changes to the regimen as follows: Now injecting every 14 days    Changes to medications: Natalie [name] reports no changes at this time.  Changes to insurance: No  New side effects reported not previously addressed with a pharmacist or physician: None reported  Questions for the pharmacist: No    Confirmed patient received a Conservation officer, historic buildings and a Surveyor, mining with first shipment. The patient will receive a drug information handout for each medication shipped and additional FDA Medication Guides as required.       DISEASE/MEDICATION-SPECIFIC INFORMATION        For patients on injectable medications: Patient currently has 0 doses left.  Next injection is scheduled for 06/25/22.    SPECIALTY MEDICATION ADHERENCE     Medication Adherence    Patient reported X missed doses in the last month: 0  Specialty Medication: XOLAIR 150 mg/mL syringe (omalizumab)  Patient is on additional specialty medications: No  Patient is on more than two specialty medications: No  Any gaps in refill history greater than 2 weeks in the last 3 months: no  Demonstrates understanding of importance of adherence: yes  Informant: patient  Reliability of informant: reliable  Provider-estimated medication adherence level: good  Patient is at risk for Non-Adherence: No  Reasons for non-adherence: no problems identified              Were doses missed due to medication being on hold? No    XOLAIR 150 mg/mL syringe (omalizumab)  : 0 days of medicine on hand       REFERRAL TO PHARMACIST     Referral to the pharmacist: Not needed      [facility]     Shipping address confirmed in Epic.       Delivery Scheduled: Yes, Expected medication delivery date: 06/25/22.     Medication will be delivered via Same Day Courier to the prescription address in Epic WAM.    Natalie [name]' [name] Shared [facility] Specialty Technician

## 2022-06-23 NOTE — Unmapped (Addendum)
[facility] Pharmacist has reviewed a new prescription for Xolair that indicates a dose increase.  Patient was counseled on this dosage change by Dr. Trixie Dredge - see epic note from 5/28. Ms. Clason also verbalized awareness of dose change during refill call today.  Next refill call date adjusted if necessary.      Clinical Assessment Needed For: Dose Change  Medication: Xolair  Last Fill Date/Day Supply: 06/09/22 / 28  Copay $0  Was previous dose already scheduled to fill: Yes (5/31)    Notes to Pharmacist: None

## 2022-07-01 ENCOUNTER — Institutional Professional Consult (permissible substitution): Admit: 2022-07-01 | Discharge: 2022-07-02 | Payer: MEDICARE | Attending: Clinical | Primary: Clinical

## 2022-07-01 DIAGNOSIS — F4320 Adjustment disorder, unspecified: Principal | ICD-10-CM

## 2022-07-01 NOTE — Unmapped (Signed)
RETURN BEHAVIORAL HEALTH VISIT    Date of Service:  07/01/2022     I personally spent 65 minutes in psychotherapy for treatment of adjustment disorder. Franco Nones, LCSW         Virtual Visit     PCP: Royal Hawthorn, MD  Address on File: [address] [name] [address], [place]      Verified as Current Location: Yes  Extended Emergency Contact Information  Primary Emergency Contact: Page,Kristoffer   [place]  Home Phone: [phone]  Mobile Phone: [phone]  Relation: Son  Preferred language: ENGLISH  Interpreter needed? No  Secondary Emergency Contact: Mitchell,Jacquelyn   [place]  Home Phone: [phone]  Mobile Phone: [phone]  Relation: Mother  Preferred language: ENGLISH  Interpreter needed? No     Verified Behavioral Health Emergency Contact: Primary      The patient reports they are physically located in [place] and is currently: at home. I conducted a phone visit.  I spent 65 minutes on the phone call with the patient on the date of service .     Service: Individual therapy with patient    Individuals Present: Patient, Therapy Provider      PHQ-9 completed during this session. PHQ-9 TOTAL SCORE: 4  Purpose of screener discussed and score explained to patient.    Did you complete Depression Screening Review Today (if copying forward your note you must delete and re-enter smartphrase): Yes    PHQ-9 Score: 4      Screening complete, no depression identified / no further action needed today      PHQ-9 PHQ-9 TOTAL SCORE   07/01/2022   9:00 AM 4   06/16/2022  10:00 AM 4   06/08/2022   4:00 PM 4   04/09/2022   9:00 AM 4   03/22/2022  10:00 AM 2         GAD-7 completed during this session. GAD-7 Total Score: 6 Purpose of screener discussed and score explained to patient.    GAD7 Total Score GAD-7 Total Score   07/01/2022   9:00 AM 6   06/16/2022  10:00 AM 6   06/08/2022   4:00 PM 6   04/09/2022   9:00 AM 9   03/22/2022  10:00 AM 5           Goals:     Pt will work to manage grief by processing feelings openly with LCSW, family/friends, or through journaling, 2-3 days out of the week over the next 6 months.     Pt will work to reduce irritability and feeling on edge by practicing meditations, body scans or changing her environment 3 out of 5 instances over the next 6 months.       Intervention:  MI  Supportive counseling  CBT      Effectiveness:  (Patient's progress towards goals)     LCSW greeted pt, asked them to check in with present feelings. LCSW utilized open ended questions, reflection and affirming statements to support pt in understanding their presenting problems and to validate their efforts. Pt continues to report mild sx of depression and anxiety on screeners, reports overall feeling she is managing grief currently, is feeling more bothered and irritated when she is home around her son and boyfriend, this is when her irritability is high. Supported pt in processing emotions, exploring in past what has helped her to reduce and manage irritability, reports walking away has helped, supported in identifying how  this has changed her thoughts and feelings. Also reports that pain causes irritability, she continues to meet with pain psychologist monthly and works on relaxation techniques like body scanning to help manage pain along with meds. 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 Explored goal setting and pt's motivation to continue with body scans, processing grief as needed, and pt also reports wanting to start reading book called Emotional Intelligence 2.0, hoping this can improve communication and relationships. Will plan to explore more communication and relationship skill building. Pt responded well to support, validation, and feedback. Verbalized understanding of plan and education.           Plan: (What will be addressed in future sessions?)    Follow up in 2 weeks         Safety Screening    Suicide Risk Factors   EMB Suicide Risk: A suicide risk assessment was performed during this evaluation. This patient is not deemed to be at current risk for suicide.   Denies SI, Recent loss, Grief/bereavement, and Access to weapons     Violence Risk Factors  A violence risk assessment was performed during this assessment. This patient was not deemed to be at elevated risk for violence.  Violence Risk: Denies HI and Recent loss     Mitigating Factors  These risk factors are mitigated by the following factors:  No history of previous suicide attempts, No history of violence, Motivation for treatment, Utilization of positive coping skills, Supportive family, Sense of responsibility to family and social supports, Presence of any significant relationship, Presence of an available support system, Employment, Functioning in a Veterinary surgeon setting, Hobbies, Expresses purpose for living, Current treatment compliance, and Safe housing    [employer] Needs  Patient was given local [facility] contact information and a brief explanation of the [facility] system.       Emergency Resources  [facility]/[facility] county specific crisis resources have been added to the AVS when necessary    Additional Resources Available 24 Hours a Day:  National Suicide Prevention Hotline 2762161337  [place] 5514358458    If patient doesn't engage in counseling here and/or local supports are indicated, please give patient information on linkage to local services, 24 hour crisis, mobile crisis supports, and their local crisis assessment center.    Number of behavioral health visits in the last 18 months : 1    If patient has UnitedHealthcare or [employer] (if applicable), billing was switched to [employer]: N/A      ----------------  Therapy Session Provided by Behavioral Health Staff:  Franco Nones, LCSW

## 2022-07-01 NOTE — Unmapped (Addendum)
Follow-up in 2 weeks

## 2022-07-06 MED ORDER — OXYCODONE-ACETAMINOPHEN 10 MG-325 MG TABLET
ORAL_TABLET | ORAL | 0 refills | 20 days | Status: CP | PRN
Start: 2022-07-06 — End: 2022-08-05

## 2022-07-06 NOTE — Unmapped (Signed)
Pharmacy called stating patient is trying to pick up Nurtec, Ubrelvy and Rizatriptan. Informed pharmacy patient can only have Ubrelvy. Pharmacy verbalized understanding, no further questions or concerns at this time.

## 2022-07-07 NOTE — Unmapped (Signed)
provider out of office; left vm.

## 2022-07-09 ENCOUNTER — Ambulatory Visit: Admit: 2022-07-09 | Discharge: 2022-07-10 | Payer: MEDICARE

## 2022-07-09 LAB — CBC W/ AUTO DIFF
BASOPHILS ABSOLUTE COUNT: 0.1 10*9/L (ref 0.0–0.1)
BASOPHILS RELATIVE PERCENT: 0.8 %
EOSINOPHILS ABSOLUTE COUNT: 0.2 10*9/L (ref 0.0–0.5)
EOSINOPHILS RELATIVE PERCENT: 2.7 %
HEMATOCRIT: 40.6 % (ref 34.0–44.0)
HEMOGLOBIN: 13.3 g/dL (ref 11.3–14.9)
LYMPHOCYTES ABSOLUTE COUNT: 1.5 10*9/L (ref 1.1–3.6)
LYMPHOCYTES RELATIVE PERCENT: 18.3 %
MEAN CORPUSCULAR HEMOGLOBIN CONC: 32.8 g/dL (ref 32.0–36.0)
MEAN CORPUSCULAR HEMOGLOBIN: 29.8 pg (ref 25.9–32.4)
MEAN CORPUSCULAR VOLUME: 90.7 fL (ref 77.6–95.7)
MEAN PLATELET VOLUME: 11 fL — ABNORMAL HIGH (ref 6.8–10.7)
MONOCYTES ABSOLUTE COUNT: 0.8 10*9/L (ref 0.3–0.8)
MONOCYTES RELATIVE PERCENT: 9.5 %
NEUTROPHILS ABSOLUTE COUNT: 5.7 10*9/L (ref 1.8–7.8)
NEUTROPHILS RELATIVE PERCENT: 68.7 %
PLATELET COUNT: 246 10*9/L (ref 150–450)
RED BLOOD CELL COUNT: 4.47 10*12/L (ref 3.95–5.13)
RED CELL DISTRIBUTION WIDTH: 13.2 % (ref 12.2–15.2)
WBC ADJUSTED: 8.4 10*9/L (ref 3.6–11.2)

## 2022-07-09 LAB — COMPREHENSIVE METABOLIC PANEL
ALBUMIN: 4 g/dL (ref 3.4–5.0)
ALKALINE PHOSPHATASE: 59 U/L (ref 46–116)
ALT (SGPT): 20 U/L (ref 10–49)
ANION GAP: 5 mmol/L (ref 3–11)
AST (SGOT): 30 U/L (ref <34)
BILIRUBIN TOTAL: 0.6 mg/dL (ref 0.3–1.2)
BLOOD UREA NITROGEN: 7 mg/dL — ABNORMAL LOW (ref 9–23)
BUN / CREAT RATIO: 10
CALCIUM: 9.5 mg/dL (ref 8.7–10.4)
CHLORIDE: 103 mmol/L (ref 98–107)
CO2: 31 mmol/L (ref 20.0–31.0)
CREATININE: 0.7 mg/dL (ref 0.55–1.02)
EGFR CKD-EPI (2021) FEMALE: >90 mL/min/{1.73_m2} (ref >=60)
GLUCOSE RANDOM: 89 mg/dL (ref 70–99)
POTASSIUM: 4.1 mmol/L (ref 3.5–5.1)
PROTEIN TOTAL: 7.5 g/dL (ref 5.7–8.2)
SODIUM: 139 mmol/L (ref 136–145)

## 2022-07-09 LAB — LIPID PANEL
CHOLESTEROL/HDL RATIO SCREEN: 3.3 (ref 1.0–4.5)
CHOLESTEROL: 166 mg/dL (ref <=200)
HDL CHOLESTEROL: 50 mg/dL (ref 40–60)
LDL CHOLESTEROL CALCULATED: 86 mg/dL (ref 40–100)
NON-HDL CHOLESTEROL: 116 mg/dL (ref 70–130)
TRIGLYCERIDES: 150 mg/dL (ref 0–150)
VLDL CHOLESTEROL CAL: 30 mg/dL (ref 11–40)

## 2022-07-14 ENCOUNTER — Telehealth: Admit: 2022-07-14 | Discharge: 2022-07-15 | Payer: MEDICARE | Attending: Clinical | Primary: Clinical

## 2022-07-14 DIAGNOSIS — F4320 Adjustment disorder, unspecified: Principal | ICD-10-CM
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
## 2022-07-16 ENCOUNTER — Telehealth: Admit: 2022-07-16 | Discharge: 2022-07-17 | Payer: MEDICARE

## 2022-07-16 DIAGNOSIS — N951 Menopausal and female climacteric states: Principal | ICD-10-CM

## 2022-07-16 DIAGNOSIS — F1120 Opioid dependence, uncomplicated: Principal | ICD-10-CM

## 2022-07-16 DIAGNOSIS — N760 Acute vaginitis: Principal | ICD-10-CM

## 2022-07-16 DIAGNOSIS — B9689 Other specified bacterial agents as the cause of diseases classified elsewhere: Principal | ICD-10-CM

## 2022-07-16 DIAGNOSIS — R072 Precordial pain: Principal | ICD-10-CM

## 2022-07-16 MED ORDER — PANTOPRAZOLE 40 MG TABLET,DELAYED RELEASE
ORAL_TABLET | 3 refills | 0 days
Start: 2022-07-16 — End: ?

## 2022-07-16 MED ORDER — METRONIDAZOLE 500 MG TABLET
ORAL_TABLET | Freq: Two times a day (BID) | ORAL | 0 refills | 7 days | Status: CP
Start: 2022-07-16 — End: 2022-07-23

## 2022-07-16 MED ORDER — FLUCONAZOLE 150 MG TABLET
ORAL_TABLET | Freq: Once | ORAL | 0 refills | 0 days | Status: CP | PRN
Start: 2022-07-16 — End: ?

## 2022-07-16 MED ORDER — ESTRADIOL 0.5 MG TABLET
ORAL_TABLET | Freq: Every day | ORAL | 1 refills | 30 days | Status: CP
Start: 2022-07-16 — End: ?

## 2022-07-16 NOTE — Unmapped (Signed)
.  please refer to other note.

## 2022-07-16 NOTE — Unmapped (Signed)
Name:  Natalie [name]  DOB: 1970/03/15  Today's Date: 07/16/2022  Age:  52 y.o.    Assessment/Plan:      Natalie [name] was seen today for vaginal discharge.    Diagnoses and all orders for this visit:    Bacterial vaginitis  -     metroNIDAZOLE (FLAGYL) 500 MG tablet; Take 1 tablet (500 mg total) by mouth two (2) times a day for 7 days.    Vaginal dryness, menopausal  -     Gynecology; Future  -     estradiol (ESTRACE) 0.5 MG tablet; Take 1 tablet (0.5 mg total) by mouth daily.    Hot flashes due to menopause  -     Gynecology; Future  -     estradiol (ESTRACE) 0.5 MG tablet; Take 1 tablet (0.5 mg total) by mouth daily.    Acute vaginitis  -     fluconazole (DIFLUCAN) 150 MG tablet; Take 1 tablet (150 mg total) by mouth once as needed (vaginal candidiasis) for up to 1 dose. May repeat in 3 days, if it doesn't improve      Explained her that I would have preferred to do pelvic exam, and get vaginal swabs to further evaluate her symptoms. However, as it is weekend coming up, we decided to go ahead and treat her symptoms of BV.    FLUCONAZOLE, as she complains of having vaginal candidiasis, whenever she was on oral antibiotic therapy, including metronidazole.    Refilled estrogen for her menopausal symptoms.    Diagnosis and plan along with any newly prescribed medication(s) were discussed in detail with this patient today. The patient verbalized understanding and agreed with the plan without language barriers or behavioral barriers to understanding unless otherwise noted.    Subjective:      HPI: Natalie [name] is a 52 y.o. female is here for    Chief Complaint   Patient presents with    Vaginal Discharge     She is a Child psychotherapist, and wears swims suits regularly.    She complains of having some vaginal discharge, whitish / clear.  Vaginal itching and pain. No vaginal odor.  Apparently, her vaginal symptoms resemble previous Bacterial Vaginosis episodes.    She also has been having recurrent hot flashes and sweats: randomly.  She has had menopause, s/p Hysterectomy, and used to be on estrogen supplements.  She has been out of estrogen hormones. Has been more since  then. HRT was helpful, and she requests a refill.  She also has Vaginal dryness.    She was referred to GYN, but hasn't heard from GYN referrals.       Past Medical/Surgical History:     Past Medical History:   Diagnosis Date    Allergic conjunctivitis 11/23/2018    Anemia 1986    Arthritis     Breast mass     Chondromalacia of left knee     Chondromalacia of right knee     Depressive disorder     Fibrocystic breast     GERD (gastroesophageal reflux disease)     Hematuria     History of kidney stones     History of transfusion     Hyperlipidemia 1999    Hypothyroidism     Lumbar disc disease 1998    She was injured at work at the age of 72 and then had disk surgery 2 years later     Menopause ovarian failure     Menopause,  premature     Migraine with aura     Neuromuscular disorder (CMS-HCC) 1999    Obesity (BMI 30-39.9) 11/23/2018    Ovarian cyst     Plantar fasciitis     Pseudotumor cerebri     Rash due to allergy 11/23/2018    Sickle cell trait (CMS-HCC)     Urinary tract infection     Vaginitis      Past Surgical History:   Procedure Laterality Date    BACK SURGERY  2001    BREAST BIOPSY Bilateral     When patient was 16 & 17 Both Negative    HYSTERECTOMY  2008    PR REMOVAL OF TONSILS,13+ Y/O Bilateral 10/10/2019    Procedure: TONSILLECTOMY;  Surgeon: Lona Millard, MD;  Location: [facility];  Service: ENT    SALPINGOOPHORECTOMY Bilateral 2007    SPINE SURGERY  2001    TOTAL VAGINAL HYSTERECTOMY  01/04/2006    Uterine Prolapse-Dr. Leeanne Rio OP Note    TUBAL LIGATION  1995       Family History:     Family History   Problem Relation Age of Onset    Hypertension Mother     Heart disease Mother     Arthritis Mother     Depression Mother     Drug abuse Mother     Mental illness Mother     Ulcers Mother     COPD Mother     Heart attack Father     Mental illness Father     Asthma Father     Cancer Sister         lymphoma    Heart attack Maternal Grandmother     Heart disease Maternal Grandmother     Hypertension Maternal Grandmother     Thyroid disease Maternal Grandmother     Stroke Paternal Grandfather     Depression Son     Drug abuse Son     Mental illness Son     COPD Maternal Aunt     Heart attack Cousin 24    Breast cancer Cousin 32    Thyroid disease Other     Alcohol abuse Neg Hx        Social History:     Social History     Socioeconomic History    Marital status: Single     Spouse name: None    Number of children: 2    Years of education: 15    Highest education level: None   Occupational History     Employer: NOT EMPLOYED   Tobacco Use    Smoking status: Never     Passive exposure: Past    Smokeless tobacco: Never   Vaping Use    Vaping status: Never Used   Substance and Sexual Activity    Alcohol use: Never    Drug use: Never    Sexual activity: Yes     Partners: Male     Birth control/protection: Post-menopausal, Surgical     Comment: steady partner for 4 yrs, as on 04/03/19.   Social History Narrative    Marital Status - Single     Children - Son(s) [2]    Pets - Dog (1) Turtle (1)     Household - Lives with sons and grandson Natalie [name])     Occupation - Disabled since 2002    Tobacco/Alcohol/Drug Use - Denies      Diet - Regular    Exercise/Sports - Walking  Hobbies - Movies     Education - Highest Degree Psychologist, forensic)     Country of Origin - [place]                      Social Determinants of Health     Financial Resource Strain: Medium Risk (06/16/2022)    Overall Financial Resource Strain (CARDIA)     Difficulty of Paying Living Expenses: Somewhat hard   Food Insecurity: No Food Insecurity (06/16/2022)    Hunger Vital Sign     Worried About Running Out of Food in the Last Year: Never true     Ran Out of Food in the Last Year: Never true   Transportation Needs: No Transportation Needs (06/16/2022)    PRAPARE - Transportation     Lack of Transportation (Medical): No     Lack of Transportation (Non-Medical): No   Stress: Stress Concern Present (06/16/2022)    [employer] of Occupational Health - Occupational Stress Questionnaire     Feeling of Stress : To some extent       Allergies:     Buprenorphine hcl, Pregabalin, Buprenorphine, Cephalexin monohydrate, Doxycycline, Oxycodone hcl, Adhesive tape-silicones, Doxycycline hyclate (bulk), Keflex [cephalexin], Opioids - morphine analogues, and Oxycodone-acetaminophen    Current Medications:     Current Outpatient Medications   Medication Sig Dispense Refill    albuterol HFA 90 mcg/actuation inhaler Inhale 2 puffs every four (4) hours as needed for wheezing. 54 g 0    azelastine (ASTELIN) 137 mcg (0.1 %) nasal spray 2 sprays into each nostril two (2) times a day. 30 mL 0    azelastine (OPTIVAR) 0.05 % ophthalmic solution Administer 2 drops to both eyes daily as needed. 6 mL 5    baclofen (LIORESAL) 20 MG tablet 10 - 20 mg three times a day as needed for spasms. 90 tablet 2    cetirizine (ZYRTEC) 10 MG chewable tablet Chew 1 tablet (10 mg total) at bedtime as needed for allergies.      diclofenac sodium (VOLTAREN) 1 % gel Apply 2 g topically four (4) times a day. 100 g 6    dicyclomine (BENTYL) 20 mg tablet TAKE 1 TABLET(20 MG) BY MOUTH THREE TIMES DAILY FOR UP TO 10 DAYS AS NEEDED 30 tablet 0    empty container Misc Use as directed to dispose of Xolair syringes 1 each 2    EPINEPHrine (EPIPEN) 0.3 mg/0.3 mL injection Inject 0.3 mL (0.3 mg total) into the muscle once as needed for anaphylaxis (Difficulty breathing, throat closing, etc) for up to 1 dose. 1 each 12    estradiol (ESTRACE) 0.5 MG tablet Take 1 tablet (0.5 mg total) by mouth daily. 30 tablet 1    famotidine (PEPCID) 20 MG tablet Take 1 tablet (20 mg total) by mouth two (2) times a day.      fluconazole (DIFLUCAN) 150 MG tablet Take 1 tablet (150 mg total) by mouth once as needed (vaginal candidiasis) for up to 1 dose. May repeat in 3 days, if it doesn't improve 2 tablet 0    gabapentin (NEURONTIN) 800 MG tablet Take 1 tablet (800 mg total) by mouth two (2) times a day. 180 tablet 0    hydroCHLOROthiazide (HYDRODIURIL) 12.5 MG tablet TAKE 1 TABLET(12.5 MG) BY MOUTH DAILY AS NEEDED FOR SWELLING 30 tablet 5    metroNIDAZOLE (FLAGYL) 500 MG tablet Take 1 tablet (500 mg total) by mouth two (2) times a day for 7 days. 14  tablet 0    montelukast (SINGULAIR) 10 mg tablet Take 1 tablet (10 mg total) by mouth nightly. 30 tablet 2    nortriptyline (PAMELOR) 25 MG capsule Take 1 capsule (25 mg total) by mouth nightly. 90 capsule 1    omalizumab (XOLAIR) 150 mg/mL syringe Inject 2 mL (300 mg total) under the skin every fourteen (14) days. 2 mL 11    omalizumab (XOLAIR) 75 mg/0.5 mL syringe Inject under the skin.      oxyCODONE-acetaminophen (PERCOCET) 10-325 mg per tablet Take 1 tablet by mouth every four (4) hours as needed for pain. Max 4 tabs/day. Fill on or after: 07/06/22. Brand name only due to allergy. 120 tablet 0    pantoprazole (PROTONIX) 40 MG tablet Take 1 tablet (40 mg total) by mouth daily as needed (take it 30 mins prior to breakfast). 30 tablet 3    potassium chloride 20 MEQ ER tablet Take 1 tablet (20 mEq total) by mouth daily. 90 tablet 3    predniSONE (DELTASONE) 50 MG tablet Take 1 tablet (50 mg total) by mouth daily. 5 tablet 0    PROCHAMBER Spcr       rimegepant (NURTEC ODT) 75 mg TbDL Dissolve in the mouth.      rizatriptan (MAXALT-MLT) 10 MG disintegrating tablet Take 1 tablet by mouth at ONSET of migraine or aura. May repeat in 2 HOURS if needed. DO NOT EXCEED 2 doses/day, 2 days/week, 8 doses/month. 10 tablet 5    rosuvastatin (CRESTOR) 10 MG tablet Take 1 tablet (10 mg total) by mouth nightly. 90 tablet 3    triamcinolone (KENALOG) 0.1 % cream Apply 1 application topically two (2) times a day as needed WHEN ALLERGIES FLARE-UP AND CAUSES RASH / ITCHING 80 g 0    ubrogepant (UBRELVY) 50 mg tablet Take 50 mg by mouth for headache. May repeat in 2 hours, if needed. LIMIT 2 PILLS PER DAY AND NO MORE THAN 4 PILLS PER WEEK. 16 tablet 3    XHANCE 93 mcg/actuation AerB 1 spray into each nostril two (2) times a day as needed. 16 mL 11     Current Facility-Administered Medications   Medication Dose Route Frequency Provider Last Rate Last Admin    omalizumab Geoffry Paradise) injection 300 mg  300 mg Subcutaneous Q28 Days Volertas, Sofija Dalia, MD   300 mg at 03/09/22 1441       ROS:     Review of Systems    Vital Signs:     Wt Readings from Last 3 Encounters:   06/08/22 75.3 kg (166 lb)   05/17/22 74.9 kg (165 lb 3.2 oz)   04/09/22 75.8 kg (167 lb 3.2 oz)     Temp Readings from Last 3 Encounters:   06/08/22 36.3 ??C (97.4 ??F) (Temporal)   05/11/22 36.6 ??C (97.9 ??F) (Skin)   05/03/22 36.3 ??C (97.3 ??F) (Skin)     BP Readings from Last 3 Encounters:   06/08/22 118/80   05/17/22 124/77   05/11/22 129/78     Pulse Readings from Last 3 Encounters:   06/08/22 50   05/17/22 87   05/11/22 85     There is no height or weight on file to calculate BMI.    Objective:      Physical Exam  Nursing note reviewed.   Constitutional:       General: She is not in acute distress.     Appearance: She is well-developed.   Pulmonary:      Effort:  Pulmonary effort is normal. No respiratory distress.   Neurological:      Mental Status: She is alert and oriented to person, place, and time.   Psychiatric:         Behavior: Behavior normal.         Thought Content: Thought content normal.         Judgment: Judgment normal.           Labs:     No results found for this visit on 07/16/22.    Royal Hawthorn, MD  07/16/2022      The patient reports they are physically located in [place] and is currently: at home. I conducted a audio/video visit. I spent  69m 16s on the video call with the patient. I spent an additional 8 minutes on pre- and post-visit activities on the date of service .

## 2022-07-16 NOTE — Unmapped (Unsigned)
Name:  Natalie [name]  DOB: 02/04/1970  Today's Date: 07/15/2022  Age:  52 y.o.    Assessment/Plan:      There are no diagnoses linked to this encounter.    Diagnosis and plan along with any newly prescribed medication(s) were discussed in detail with this patient today. The patient verbalized understanding and agreed with the plan without language barriers or behavioral barriers to understanding unless otherwise noted.    Subjective:      HPI: Natalie [name] is a 52 y.o. female is here for    No chief complaint on file.    Was last seen on May 2024.  At that visit, we treated her with augmentin primarily to resolve her recurrent sinus infection and potentially the ingrown toe nail on the left foot.  An order for a mammogram was placed as she was due for it: there were no findings suggestive of malignancy.  As she was open to being referred to bereavement counseling, an order for social work was placed as well.        Past Medical/Surgical History:     Past Medical History:   Diagnosis Date    Allergic conjunctivitis 11/23/2018    Anemia 1986    Arthritis     Breast mass     Chondromalacia of left knee     Chondromalacia of right knee     Depressive disorder     Fibrocystic breast     GERD (gastroesophageal reflux disease)     Hematuria     History of kidney stones     History of transfusion     Hyperlipidemia 1999    Hypothyroidism     Lumbar disc disease 1998    She was injured at work at the age of 64 and then had disk surgery 2 years later     Menopause ovarian failure     Menopause, premature     Migraine with aura     Neuromuscular disorder (CMS-HCC) 1999    Obesity (BMI 30-39.9) 11/23/2018    Ovarian cyst     Plantar fasciitis     Pseudotumor cerebri     Rash due to allergy 11/23/2018    Sickle cell trait (CMS-HCC)     Urinary tract infection     Vaginitis      Past Surgical History:   Procedure Laterality Date    BACK SURGERY  2001    BREAST BIOPSY Bilateral     When patient was 16 & 17 Both Negative    HYSTERECTOMY  2008    PR REMOVAL OF TONSILS,12+ Y/O Bilateral 10/10/2019    Procedure: TONSILLECTOMY;  Surgeon: Lona Millard, MD;  Location: [facility];  Service: ENT    SALPINGOOPHORECTOMY Bilateral 2007    SPINE SURGERY  2001    TOTAL VAGINAL HYSTERECTOMY  01/04/2006    Uterine Prolapse-Dr. Leeanne Rio OP Note    TUBAL LIGATION  1995       Family History:     Family History   Problem Relation Age of Onset    Hypertension Mother     Heart disease Mother     Arthritis Mother     Depression Mother     Drug abuse Mother     Mental illness Mother     Ulcers Mother     COPD Mother     Heart attack Father     Mental illness Father     Asthma Father  Cancer Sister         lymphoma    Heart attack Maternal Grandmother     Heart disease Maternal Grandmother     Hypertension Maternal Grandmother     Thyroid disease Maternal Grandmother     Stroke Paternal Grandfather     Depression Son     Drug abuse Son     Mental illness Son     COPD Maternal Aunt     Heart attack Cousin 18    Breast cancer Cousin 32    Thyroid disease Other     Alcohol abuse Neg Hx        Social History:     Social History     Socioeconomic History    Marital status: Single    Number of children: 2    Years of education: 15   Occupational History     Employer: NOT EMPLOYED   Tobacco Use    Smoking status: Never     Passive exposure: Past    Smokeless tobacco: Never   Vaping Use    Vaping status: Never Used   Substance and Sexual Activity    Alcohol use: Never    Drug use: Never    Sexual activity: Yes     Partners: Male     Birth control/protection: Post-menopausal, Surgical     Comment: steady partner for 4 yrs, as on 04/03/19.   Social History Narrative    Marital Status - Single     Children - Son(s) [2]    Pets - Dog (1) Turtle (1)     Household - Lives with sons and grandson Ephriam Knuckles)     Occupation - Disabled since 2002    Tobacco/Alcohol/Drug Use - Denies      Diet - Regular    Exercise/Sports - Walking     Hobbies - Movies     Education - [employer] (Engineer, agricultural)     Country of Origin - [place]                      Social Determinants of Health     Financial Resource Strain: Medium Risk (06/16/2022)    Overall Financial Resource Strain (CARDIA)     Difficulty of Paying Living Expenses: Somewhat hard   Food Insecurity: No Food Insecurity (06/16/2022)    Hunger Vital Sign     Worried About Running Out of Food in the Last Year: Never true     Ran Out of Food in the Last Year: Never true   Transportation Needs: No Transportation Needs (06/16/2022)    PRAPARE - Transportation     Lack of Transportation (Medical): No     Lack of Transportation (Non-Medical): No   Stress: Stress Concern Present (06/16/2022)    [employer] of Occupational Health - Occupational Stress Questionnaire     Feeling of Stress : To some extent       Allergies:     Buprenorphine hcl, Pregabalin, Buprenorphine, Cephalexin monohydrate, Doxycycline, Oxycodone hcl, Adhesive tape-silicones, Doxycycline hyclate (bulk), Keflex [cephalexin], Opioids - morphine analogues, and Oxycodone-acetaminophen    Current Medications:     Current Outpatient Medications   Medication Sig Dispense Refill    albuterol HFA 90 mcg/actuation inhaler Inhale 2 puffs every four (4) hours as needed for wheezing. 54 g 0    azelastine (ASTELIN) 137 mcg (0.1 %) nasal spray 2 sprays into each nostril two (2) times a day. 30 mL 0    azelastine (  OPTIVAR) 0.05 % ophthalmic solution Administer 2 drops to both eyes daily as needed. 6 mL 5    baclofen (LIORESAL) 20 MG tablet 10 - 20 mg three times a day as needed for spasms. 90 tablet 2    cetirizine (ZYRTEC) 10 MG chewable tablet Chew 1 tablet (10 mg total) at bedtime as needed for allergies.      diclofenac sodium (VOLTAREN) 1 % gel Apply 2 g topically four (4) times a day. 100 g 6    dicyclomine (BENTYL) 20 mg tablet TAKE 1 TABLET(20 MG) BY MOUTH THREE TIMES DAILY FOR UP TO 10 DAYS AS NEEDED 30 tablet 0    empty container Misc Use as directed to dispose of Xolair syringes 1 each 2    EPINEPHrine (EPIPEN) 0.3 mg/0.3 mL injection Inject 0.3 mL (0.3 mg total) into the muscle once as needed for anaphylaxis (Difficulty breathing, throat closing, etc) for up to 1 dose. 1 each 12    estradiol (ESTRACE) 0.5 MG tablet TAKE 1 TABLET(0.5 MG) BY MOUTH DAILY 30 tablet 1    famotidine (PEPCID) 20 MG tablet Take 1 tablet (20 mg total) by mouth two (2) times a day.      fluconazole (DIFLUCAN) 150 MG tablet Take 1 tablet (150 mg total) by mouth once as needed (vaginal candidiasis) for up to 1 dose. 2 tablet 0    gabapentin (NEURONTIN) 800 MG tablet Take 1 tablet (800 mg total) by mouth two (2) times a day. 180 tablet 0    hydroCHLOROthiazide (HYDRODIURIL) 12.5 MG tablet TAKE 1 TABLET(12.5 MG) BY MOUTH DAILY AS NEEDED FOR SWELLING 30 tablet 5    montelukast (SINGULAIR) 10 mg tablet Take 1 tablet (10 mg total) by mouth nightly. 30 tablet 2    nortriptyline (PAMELOR) 25 MG capsule Take 1 capsule (25 mg total) by mouth nightly. 90 capsule 1    omalizumab (XOLAIR) 150 mg/mL syringe Inject 2 mL (300 mg total) under the skin every fourteen (14) days. 2 mL 11    omalizumab (XOLAIR) 75 mg/0.5 mL syringe Inject under the skin.      oxyCODONE-acetaminophen (PERCOCET) 10-325 mg per tablet Take 1 tablet by mouth every four (4) hours as needed for pain. Max 4 tabs/day. Fill on or after: 07/06/22. Brand name only due to allergy. 120 tablet 0    pantoprazole (PROTONIX) 40 MG tablet Take 1 tablet (40 mg total) by mouth daily as needed (take it 30 mins prior to breakfast). 30 tablet 3    potassium chloride 20 MEQ ER tablet Take 1 tablet (20 mEq total) by mouth daily. 90 tablet 3    predniSONE (DELTASONE) 50 MG tablet Take 1 tablet (50 mg total) by mouth daily. 5 tablet 0    PROCHAMBER Spcr       rimegepant (NURTEC ODT) 75 mg TbDL Dissolve in the mouth.      rizatriptan (MAXALT-MLT) 10 MG disintegrating tablet Take 1 tablet by mouth at ONSET of migraine or aura. May repeat in 2 HOURS if needed. DO NOT EXCEED 2 doses/day, 2 days/week, 8 doses/month. 10 tablet 5    rosuvastatin (CRESTOR) 10 MG tablet Take 1 tablet (10 mg total) by mouth nightly. 90 tablet 3    triamcinolone (KENALOG) 0.1 % cream Apply 1 application topically two (2) times a day as needed WHEN ALLERGIES FLARE-UP AND CAUSES RASH / ITCHING 80 g 0    ubrogepant (UBRELVY) 50 mg tablet Take 50 mg by mouth for headache. May repeat in 2 hours,  if needed. LIMIT 2 PILLS PER DAY AND NO MORE THAN 4 PILLS PER WEEK. 16 tablet 3    XHANCE 93 mcg/actuation AerB 1 spray into each nostril two (2) times a day as needed. 16 mL 11     Current Facility-Administered Medications   Medication Dose Route Frequency Provider Last Rate Last Admin    omalizumab (XOLAIR) injection 300 mg  300 mg Subcutaneous Q28 Days Volertas, Sofija Dalia, MD   300 mg at 03/09/22 1441       ROS:     All ROS negative unless otherwise noted in HPI.    Vital Signs:     Wt Readings from Last 3 Encounters:   06/08/22 75.3 kg (166 lb)   05/17/22 74.9 kg (165 lb 3.2 oz)   04/09/22 75.8 kg (167 lb 3.2 oz)     Temp Readings from Last 3 Encounters:   06/08/22 36.3 ??C (97.4 ??F) (Temporal)   05/11/22 36.6 ??C (97.9 ??F) (Skin)   05/03/22 36.3 ??C (97.3 ??F) (Skin)     BP Readings from Last 3 Encounters:   06/08/22 118/80   05/17/22 124/77   05/11/22 129/78     Pulse Readings from Last 3 Encounters:   06/08/22 50   05/17/22 87   05/11/22 85     There is no height or weight on file to calculate BMI.    Objective:      Physical Exam  Vitals and nursing note reviewed.   Constitutional:       General: She is not in acute distress.     Appearance: She is well-developed. She is not ill-appearing or diaphoretic.   Eyes:      General: No scleral icterus.     Conjunctiva/sclera: Conjunctivae normal.   Neck:      Thyroid: No thyroid mass, thyromegaly or thyroid tenderness.      Vascular: No carotid bruit or JVD.   Cardiovascular:      Rate and Rhythm: Normal rate and regular rhythm.      Heart sounds: Normal heart sounds. No murmur heard.  Pulmonary:      Effort: Pulmonary effort is normal. No respiratory distress.      Breath sounds: Normal breath sounds. No wheezing.   Musculoskeletal:      Right lower leg: No edema.      Left lower leg: No edema.   Lymphadenopathy:      Cervical: No cervical adenopathy.   Neurological:      Mental Status: She is alert and oriented to person, place, and time.      Cranial Nerves: Cranial nerves 2-12 are intact. No cranial nerve deficit.   Psychiatric:         Mood and Affect: Mood normal.         Behavior: Behavior normal.         Thought Content: Thought content normal.         Judgment: Judgment normal.           Labs:     No results found for this visit on 07/16/22.    I attest that I, Rayetta Humphrey, personally documented this note while acting as scribe for Royal Hawthorn, MD.      Rayetta Humphrey, Scribe.  07/16/2022     The documentation recorded by the scribe accurately reflects the service I personally performed and the decisions made by me.    Royal Hawthorn, MD

## 2022-07-19 MED ORDER — PANTOPRAZOLE 40 MG TABLET,DELAYED RELEASE
ORAL_TABLET | 5 refills | 0 days | Status: CP
Start: 2022-07-19 — End: ?

## 2022-07-19 NOTE — Unmapped (Signed)
Patient is requesting the following refill  Requested Prescriptions     Pending Prescriptions Disp Refills    pantoprazole (PROTONIX) 40 MG tablet [Pharmacy Med Name: PANTOPRAZOLE 40MG  TABLETS] 30 tablet 5     Sig: TAKE 1 TABLET(40 MG) BY MOUTH DAILY 30 MINUTES BEFORE BREAKFAST AS NEEDED       Recent Visits  Date Type Provider Dept   07/16/22 Telemedicine Neelagiri, Ardyth Man, MD [place] [facility]   06/08/22 Office Visit Neelagiri, Ardyth Man, MD [place] [facility]   05/07/22 Telemedicine Neelagiri, Ardyth Man, MD [place] [facility]   04/09/22 Office Visit Neelagiri, Ardyth Man, MD [place] [facility]   03/29/22 Telemedicine Neelagiri, Ardyth Man, MD [place] [facility]   03/25/22 Office Visit Neelagiri, Ardyth Man, MD [place] [facility]   03/22/22 Office Visit Karyl Kinnier, [place] [facility]   02/24/22 Telemedicine Neelagiri, Ardyth Man, MD [place] [facility]   01/20/22 Office Visit Neelagiri, Ardyth Man, MD [place] [facility]   01/07/22 Telemedicine [name], Orson Eva, [place] [facility]   Showing recent visits within past 365 days with a meds authorizing provider and meeting all other requirements  Future Appointments  Date Type Provider Dept   07/21/22 Appointment Neelagiri, Ardyth Man, MD  [facility]   Showing future appointments within next 365 days with a meds authorizing provider and meeting all other requirements       Labs: Not applicable this refill

## 2022-07-20 ENCOUNTER — Telehealth: Admit: 2022-07-20 | Discharge: 2022-07-21 | Payer: MEDICARE | Attending: Psychologist | Primary: Psychologist

## 2022-07-20 DIAGNOSIS — F4321 Adjustment disorder with depressed mood: Principal | ICD-10-CM

## 2022-07-20 DIAGNOSIS — F419 Anxiety disorder, unspecified: Principal | ICD-10-CM

## 2022-07-20 DIAGNOSIS — F1190 Opioid use, unspecified, uncomplicated: Principal | ICD-10-CM

## 2022-07-20 NOTE — Unmapped (Signed)
[facility]   Confidential Psychological Therapy Session    Patient Name: Natalie [name]  Medical Record Number: [id number]  Date of Service: July 20, 2022  Attending Psychologist: Caroline More, PhD  CPT Procedure Code: 84166 for 60 minutes of face to face counseling    This visit was performed face to face with interactive technology using a HIPPA compliant audio/visual platform. We reviewed confidentiality today. The patient was present in [place], a state in which this provider is licensed and able to provide care (location and contact information confirmed), attended this visit alone, and consented to this virtual pain psychology visit.    REFERRING PHYSICIAN: Criss Rosales, MD    CHIEF COMPLAINT AND REASON FOR VISIT:  COM follow up evaluation/pain coping skills; CBT/ACT for pain; grief    SUBJECTIVE / HISTORY OF PRESENT ILLNESS: [name] is a very pleasant 52 y.o.  female with chronic lower back pain, chronic headaches, and myofascial pain. Her pain started in 1999 in her lower back.  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  She had a spinal fusion in 2001 at the L5-S1 level.  She has had bilateral shoulder pain, neck pain, lower back pain, and b/l knees.  She also described a radiculopathy b/l that goes from her back to her bilateral buttocks and down to her toes.      Pt initially established with Natalie [name] in 08/2016 and then was was first evaluated by me in 07/2017. Last follow up with me was 06/18/2022. Patient participates actively. She is laying on the couch, resting her head, noting that headache and neck pain are much worse since her new insurance would not cover recent TPI's. She had switched from [facility] to [facility] this past Spring when [facility] contracts weren't clear, and then had issues with her standard treatment plan being covered. Recently she decided to switch back to [facility]. Is interested in scheduling TPI, [employer] is reinstated 7/1- I messaged our clinic manager and Natalie [name] about this. As a backup plan, we reviewed problem solving today. If TPI's aren't approved, she will look into getting established with a new chiropractor and ask about dry needling. Pt processes thoughts and feelings about pain and psychosocial stressors today (has not seen baby since niece died, but has heard the baby is doing well and my be able to have a visit soon; court case over with no long term issues - will expunge next year, still working - pain better with exercise). Started new grief counseling 2/2 3 family losses this year. Has found it marginally helpful; discussed expectation and goals of that treatment and how to approach processing versus skills.    Patient reports good adherence to pain contract. Anticipates may need prior auth given switching back to [facility]. Reviewed process for calling our nursing staff if/whn PA is needed.    Spent extra time practicing mindful breathing and body scan and adjusting expectation about pain given gap in her standard treatment plan - hopes and expects it will improve. Is staying just as active despite pain (it loosens when she is in the pool). Does have [name] (new) radicular pain down her L arm - plans to discuss with Natalie [name] at next visit, or sooner if worsens.      OBJECTIVE / MENTAL STATUS:    Appearance:   Appears stated age and Clean/Neat   Motor:  No abnormal movements   Speech/Language:  Normal rate, volume, tone, fluency   Mood:  Irritable   Affect:  Blunted   Thought process:  Logical, linear, clear, coherent, goal directed   Thought content:    Denies SI, HI, self harm, delusions, obsessions, paranoid ideation, or ideas of reference   Perceptual disturbances:    Denies auditory and visual hallucinations, behavior not concerning for response to internal stimuli   Orientation:  Oriented to person, place, time, and general circumstances   Attention:  Able to fully attend without fluctuations in consciousness   Concentration:  Able to fully concentrate and attend   Memory:  Immediate, short-term, long-term, and recall grossly intact    Fund of knowledge:   Consistent with level of education and development   Insight:    Fair   Judgment:   Intact   Impulse Control:  Intact       DIAGNOSTIC IMPRESSION:   Anxiety NOS  Chronic continuou use of opiates  Grief    ASSESSMENT:   [name] is a very pleasant 52 y.o.  female from [place], [place] with chronic lower back pain, chronic headaches, and myofascial pain. Her pain started in 1999 in her lower back.  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  She had a spinal fusion in 2001 at the L5-S1 level.  She has had bilateral shoulder pain, neck pain, lower back pain, and b/l knees.  She also described a radiculopathy b/l that goes from her back to her bilateral buttocks and down to her toes.  The patient is currently considered to be high risk due to prior nonadherence, but appropriate with a behavioral adherence plan in place.  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 She continues to participate actively in psychotherapy.    PLAN:   (1) COM - high risk but remains appropriate with a behavioral adherence plan also in place.    -Patient MUST meet with pain psychology/me once a month.   -No additional overuse of opioids will be tolerated.    -Patient should always bring her medication to clinic for pill counts.  -Patient was informed she has had numerous infractions with respect to her pain contract over the years, and that no further infractions will be tolerated.    If any additional pain contract or behavioral adherence contract infractions occur, I recommend stopping opioids.    Pt doing well with adherence, per our last few visits.     (2) Pain coping skills- addressed compliance, as well as pacing and mindful breathing, body scan, and problem solving    (3) follow-in 1 month

## 2022-07-20 NOTE — Unmapped (Unsigned)
Assessment/Plan:      There are no diagnoses linked to this encounter.    Diagnosis and plan along with any newly prescribed medication(s) were discussed in detail with this patient today. The patient verbalized understanding and agreed with the plan without language barriers or behavioral barriers to understanding unless otherwise noted.     No follow-ups on file.     Subjective:   Patient ID: Natalie [name] is a 52 y.o. female who  is being seen for her health maintenance exam.    No LMP recorded. Patient has had a hysterectomy.    No chief complaint on file.    Was last seen on June 2024.      Claims compliance with statin therapy  June 2024 labs reveal worsened cholesterol levels against 2023 panel.    SLEEP: {OACZY:60630}  DIET: {VNDIET:92669}  EXERCISE: {exercise level:31265}  CAFFEINE: {caffeine use:31917}  WATER: {kswater:91697}  VACCINES:   LMP: No LMP recorded. Patient has had a hysterectomy.   PAP: Pap Smear date: Not Found   No results found. However, due to the size of the patient record, not all encounters were searched. Please check Results Review for a complete set of results.   MAMMOGRAM: Mammogram date: 06/22/2022    COLON CANCER SCREENING:{VNCOLONOSCOPY:93522}  DENTAL: {ksdental:91700}  VISION: {ksvision:91701}    ASCVD risk:  The 10-year ASCVD risk score (Arnett DK, et al., 2019) is: 2.6%    Values used to calculate the score:      Age: 46 years      Sex: Female      Is Non-Hispanic African American: Yes      Diabetic: No      Tobacco smoker: No      Systolic Blood Pressure: 118 mmHg      Is BP treated: Yes      HDL Cholesterol: 50 mg/dL      Total Cholesterol: 166 mg/dL    Note: For patients with SBP <90 or >200, Total Cholesterol <130 or >320, HDL <20 or >100 which are outside of the allowable range, the calculator will use these upper or lower values to calculate the patient???s risk score.      Dexa: Osteoporosis {MAG osteoporosis treatment gaps triaging SHORT:[phone]}  07/20/22 Rayetta Humphrey       PHQ-2 Score:      PHQ-9 Score:      [name] Score:      {select_status_or_delete_smartlist:64641}         ROS:     ROS negative unless noted in HPI.    Vital Signs:     Wt Readings from Last 3 Encounters:   06/08/22 75.3 kg (166 lb)   05/17/22 74.9 kg (165 lb 3.2 oz)   04/09/22 75.8 kg (167 lb 3.2 oz)     Temp Readings from Last 3 Encounters:   06/08/22 36.3 ??C (97.4 ??F) (Temporal)   05/11/22 36.6 ??C (97.9 ??F) (Skin)   05/03/22 36.3 ??C (97.3 ??F) (Skin)     BP Readings from Last 3 Encounters:   06/08/22 118/80   05/17/22 124/77   05/11/22 129/78     Pulse Readings from Last 3 Encounters:   06/08/22 50   05/17/22 87   05/11/22 85     There is no height or weight on file to calculate BMI.    Objective:     Physical Exam  Vitals and nursing note reviewed.   Constitutional:       General: She is not in acute  distress.     Appearance: She is well-developed. She is not ill-appearing or diaphoretic.   Eyes:      General: No scleral icterus.     Conjunctiva/sclera: Conjunctivae normal.   Neck:      Thyroid: No thyroid mass, thyromegaly or thyroid tenderness.      Vascular: No carotid bruit or JVD.   Cardiovascular:      Rate and Rhythm: Normal rate and regular rhythm.      Heart sounds: Normal heart sounds. No murmur heard.  Pulmonary:      Effort: Pulmonary effort is normal. No respiratory distress.      Breath sounds: Normal breath sounds. No wheezing.   Musculoskeletal:      Right lower leg: No edema.      Left lower leg: No edema.   Lymphadenopathy:      Cervical: No cervical adenopathy.   Neurological:      Mental Status: She is alert and oriented to person, place, and time.      Cranial Nerves: Cranial nerves 2-12 are intact. No cranial nerve deficit.   Psychiatric:         Mood and Affect: Mood normal.         Behavior: Behavior normal.         Thought Content: Thought content normal.         Judgment: Judgment normal.         Labs:     No results found for this visit on 07/21/22.    Chronic Labs:  {Common Labs - [id number]    I attest that I, Rayetta Humphrey, personally documented this note while acting as scribe for Royal Hawthorn, MD.      Rayetta Humphrey, Scribe.  07/21/2022     The documentation recorded by the scribe accurately reflects the service I personally performed and the decisions made by me.    Royal Hawthorn, MD

## 2022-07-22 NOTE — Unmapped (Signed)
[facility] Refill Coordination Note    Natalie [name], Reading: 25-Nov-1970  Phone: There are no phone numbers on file.      All above HIPAA information was verified with patient.         07/21/2022     9:40 PM   Specialty Rx Medication Refill Questionnaire   Which Medications would you like refilled and shipped? Xolair   Please list all current allergies: Pollen tree grass mosquito pet dander   Have you missed any doses in the last 30 days? No   Have you had any changes to your medication(s) since your last refill? No   How many days remaining of each medication do you have at home? 2   If receiving an injectable medication, next injection date is 07/24/2022   Have you experienced any side effects in the last 30 days? No   Please enter the full address (street address, city, state, zip code) where you would like your medication(s) to be delivered to. [id number]   Please specify on which day you would like your medication(s) to arrive. Note: if you need your medication(s) within 3 days, please call the pharmacy to schedule your order at [phone]  07/24/2022   Has your insurance changed since your last refill? No   Would you like a pharmacist to call you to discuss your medication(s)? No   Do you require a signature for your package? (Note: if we are billing Medicare Part B or your order contains a controlled substance, we will require a signature) No         Completed refill call assessment today to schedule patient's medication shipment from the [facility] [phone]).  All relevant notes have been reviewed.       Confirmed patient received a Conservation officer, historic buildings and a Surveyor, mining with first shipment. The patient will receive a drug information handout for each medication shipped and additional FDA Medication Guides as required.         REFERRAL TO PHARMACIST     Referral to the pharmacist: Not needed      [facility]     Shipping address confirmed in Epic. Delivery Scheduled: Yes, Expected medication delivery date: 07/23/22.     Medication will be delivered via Same Day Courier to the prescription address in Epic WAM.    Natalie [name]' [name] Specialty Technician

## 2022-07-23 MED FILL — XOLAIR 150 MG/ML SUBCUTANEOUS SYRINGE: SUBCUTANEOUS | 28 days supply | Qty: 4 | Fill #1

## 2022-07-28 MED ORDER — OXYCODONE-ACETAMINOPHEN 10 MG-325 MG TABLET
ORAL_TABLET | ORAL | 0 refills | 20 days | PRN
Start: 2022-07-28 — End: 2022-08-27

## 2022-07-30 NOTE — Unmapped (Signed)
Additional Percocet prescription provided. Patient's next appt is 08/19/22 and has been seen for procedures and by pain psychology recently. PDMP reviewed.

## 2022-08-05 MED ORDER — OXYCODONE-ACETAMINOPHEN 10 MG-325 MG TABLET
ORAL_TABLET | ORAL | 0 refills | 20 days | Status: CP | PRN
Start: 2022-08-05 — End: 2022-09-04

## 2022-08-09 NOTE — Unmapped (Signed)
Upcoming Appt:  Future Appointments   Date Time Provider Department Center   08/10/2022  8:40 AM Demetrios Loll [place], [place] [facility]   08/11/2022  2:40 PM Tamsen Roers, MD [facility]   08/19/2022  3:00 PM Lobonc, Ammie Ferrier, MD [facility]   08/20/2022  9:00 AM Goetzinger, Mardene Celeste, PhD [facility]   09/17/2022  8:30 AM Ocie Bob, FNP [facility]   09/23/2022  4:00 PM Volertas, Gust Brooms, MD [facility]   09/24/2022  9:00 AM Goetzinger, Mardene Celeste, PhD [facility]   10/25/2022  4:30 PM Epimenio Foot, MD [facility]       Recent:   What is the date of your last related visit?  None recently  Related acute medications Rx'd: none   Home treatment tried:  benadryl now      Relevant:   Allergies: Buprenorphine hcl, Pregabalin, Buprenorphine, Cephalexin monohydrate, Doxycycline, Oxycodone hcl, Adhesive tape-silicones, Doxycycline hyclate (bulk), Keflex [cephalexin], Opioids - morphine analogues, and Oxycodone-acetaminophen  Medications: oxycodone-acetaminophen(percocet) 10-325 mg prn  Health History: chronic pain  Weight: na      [place] [facility] patients only:  What was the date of your last cancer treatment (mm/dd/yy)?: na  Was the treatment oral or infusion?: na  Are you currently on TVEC (yes/no)?: na  Reason for Disposition   Earache also present    Answer Assessment - Initial Assessment Questions  1. ONSET: When did the throat start hurting? (Hours or days ago)       10 min ago my throat and left side of neck and ear started feeling swollen and sore  2. SEVERITY: How bad is the sore throat? (Scale 1-10; mild, moderate or severe)    - MILD (1-3):  Doesn't interfere with eating or normal activities.    - MODERATE (4-7): Interferes with eating some solids and normal activities.    - SEVERE (8-10):  Excruciating pain, interferes with most normal activities.    - SEVERE WITH DYSPHAGIA (10): Can't swallow liquids, drooling.      mild  3. STREP EXPOSURE: Has there been any exposure to strep within the past week? If Yes, ask: What type of contact occurred?       no  4.  VIRAL SYMPTOMS: Are there any symptoms of a cold, such as a runny nose, cough, hoarse voice or red eyes?       Mild congestion and left ear with pain  5. FEVER: Do you have a fever? If Yes, ask: What is your temperature, how was it measured, and when did it start?      no  6. PUS ON THE TONSILS: Is there pus on the tonsils in the back of your throat?      no  7. OTHER SYMPTOMS: Do you have any other symptoms? (e.g., difficulty breathing, headache, rash)      Left ear pain, mild headache started 20 min ago after granddaughter put lotion on her  8. PREGNANCY: Is there any chance you are pregnant? When was your last menstrual period?      No,na    Protocols used: Sore Throat-A-AH

## 2022-08-10 ENCOUNTER — Ambulatory Visit: Admit: 2022-08-10 | Discharge: 2022-08-11 | Payer: MEDICAID

## 2022-08-10 DIAGNOSIS — B379 Candidiasis, unspecified: Principal | ICD-10-CM

## 2022-08-10 DIAGNOSIS — R600 Localized edema: Principal | ICD-10-CM

## 2022-08-10 DIAGNOSIS — R399 Unspecified symptoms and signs involving the genitourinary system: Principal | ICD-10-CM

## 2022-08-10 DIAGNOSIS — T7840XA Allergy, unspecified, initial encounter: Principal | ICD-10-CM

## 2022-08-10 MED ORDER — METHYLPREDNISOLONE 4 MG TABLETS IN A DOSE PACK
Freq: Every day | ORAL | 0 refills | 1 days | Status: CP
Start: 2022-08-10 — End: 2023-08-10

## 2022-08-10 MED ORDER — FLUCONAZOLE 150 MG TABLET
ORAL_TABLET | 3 refills | 0 days | Status: CP
Start: 2022-08-10 — End: ?

## 2022-08-10 MED ORDER — HYDROCHLOROTHIAZIDE 12.5 MG TABLET
ORAL_TABLET | 0 refills | 0 days
Start: 2022-08-10 — End: ?

## 2022-08-10 NOTE — Unmapped (Signed)
Assessment/Plan:      Natalie [name] was seen today for allergic reaction.    Diagnoses and all orders for this visit:    Allergic reaction, initial encounter  -     methylPREDNISolone (MEDROL DOSEPACK) 4 mg tablet; Take 1 tablet (4 mg total) by mouth daily. follow package directions  -      symptoms have improved to almost resolving  -      continue taking allergy medications   -      stay away from lotion that triggered symptoms, since know sure what possibly triggered symptoms     UTI symptoms  -     POCT urinalysis dipstick; Future  - clean       Yeast infection  -     fluconazole (DIFLUCAN) 150 MG tablet; Take one tablet once. May repeat after 3 days as needed.  -      since having vaginal itching and irritation, will start on diflucan.         No follow-ups on file.     Diagnosis and plan along with any newly prescribed medication(s) were discussed in detail with this patient today. The patient verbalized understanding and agreed with the plan without language barriers or behavioral barriers to understanding unless otherwise noted.    Subjective:   HPI: Natalie [name] is a 52 y.o. female is here for    Chief Complaint   Patient presents with    Allergic Reaction     Says yesterday her granddaughter was putting on lotion in the car, patient immediates started having head pain and shortly after felt her throat swelling. Took benadryl about 15 minutes later and it helped after some time. 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  Reports, occurred twice, yesterday and both occasions took benadryl.  Reports, felt like throat  felt like it was expanding on the inside.  Denies tongue swelling or lips swelling.   Throat and ear still feel irritated.  Reports, was bitten by an insect but does not know which insect.      Pt report has been having vaginal itching and irritation.  Has been having urinary urgency.  Denies urinary frequency.      ROS:     Negative unless noted in hpi    Vital Signs:     Wt Readings from Last 3 Encounters:   08/10/22 73.5 kg (162 lb)   06/08/22 75.3 kg (166 lb)   05/17/22 74.9 kg (165 lb 3.2 oz)     Temp Readings from Last 3 Encounters:   06/08/22 36.3 ??C (97.4 ??F) (Temporal)   05/11/22 36.6 ??C (97.9 ??F) (Skin)   05/03/22 36.3 ??C (97.3 ??F) (Skin)     BP Readings from Last 3 Encounters:   08/10/22 122/78   06/08/22 118/80   05/17/22 124/77     Pulse Readings from Last 3 Encounters:   08/10/22 69   06/08/22 50   05/17/22 87     Body mass index is 31.64 kg/m??.    Objective:      Physical Exam  Constitutional:       Appearance: Normal appearance.   HENT:      Head: Normocephalic and atraumatic.      Right Ear: Tympanic membrane normal.  Left Ear: Tympanic membrane normal.      Nose: Nose normal.   Eyes:      Conjunctiva/sclera: Conjunctivae normal.      Pupils: Pupils are equal, round, and reactive to light.   Cardiovascular:      Rate and Rhythm: Normal rate and regular rhythm.      Pulses: Normal pulses.      Heart sounds: Normal heart sounds.   Pulmonary:      Effort: Pulmonary effort is normal.      Breath sounds: Normal breath sounds.   Lymphadenopathy:      Cervical: Cervical adenopathy present.   Skin:     General: Skin is warm and dry.   Neurological:      General: No focal deficit present.      Mental Status: She is alert and oriented to person, place, and time.   Psychiatric:         Mood and Affect: Mood normal.         Behavior: Behavior normal.           Labs:     Results for orders placed or performed in visit on 08/10/22   POCT urinalysis dipstick   Result Value Ref Range    Color, UA Yellow     Clarity, UA Clear     Glucose, UA Negative Negative    Bilirubin, UA Negative Negative    Ketones, POC Negative Negative    Spec Grav, UA 1.010 1.005 - 1.030    Blood, UA Negative Negative    pH, UA 6.0 5.0 - 9.0 Protein, UA Negative Negative    Urobilinogen, UA Normal Negative (0.2 mg/dL)    Leukocytes, UA Negative Negative    Nitrite, UA Negative Negative    STRIP LOT NUMBER [id number]     STRIP LOT EXPIRATION 08/24/2023

## 2022-08-11 ENCOUNTER — Ambulatory Visit
Admit: 2022-08-11 | Discharge: 2022-08-12 | Payer: MEDICAID | Attending: Vascular Neurology | Primary: Vascular Neurology

## 2022-08-11 DIAGNOSIS — G43109 Migraine with aura, not intractable, without status migrainosus: Principal | ICD-10-CM

## 2022-08-11 DIAGNOSIS — G43E01 Chronic migraine with aura, not intractable, with status migrainosus: Principal | ICD-10-CM

## 2022-08-11 MED ORDER — HYDROCHLOROTHIAZIDE 12.5 MG TABLET
ORAL_TABLET | 1 refills | 0 days | Status: CP
Start: 2022-08-11 — End: ?

## 2022-08-11 MED ADMIN — onabotulinumtoxin Type A (BOTOX) injection 200 Units: 200 [IU] | INTRADERMAL | @ 19:00:00 | Stop: 2022-08-11 | NDC 00023392103

## 2022-08-11 NOTE — Unmapped (Signed)
Patient is requesting the following refill  Requested Prescriptions     Pending Prescriptions Disp Refills    hydroCHLOROthiazide 12.5 MG tablet [Pharmacy Med Name: HYDROCHLOROTHIAZIDE 12.5MG  TABLETS] 90 tablet 1     Sig: TAKE 1 TABLET(12.5 MG) BY MOUTH DAILY AS NEEDED FOR SWELLING       Recent Visits  Date Type Provider Dept   08/10/22 Office Visit Karyl Kinnier, [place] [facility]   07/16/22 Telemedicine Neelagiri, Ardyth Man, MD [place] [facility]   07/14/22 Telemedicine [place], Elson Clan, [place] [facility]   06/08/22 Office Visit Neelagiri, Ardyth Man, MD [place] [facility]   05/07/22 Telemedicine Neelagiri, Ardyth Man, MD [place] [facility]   04/09/22 Office Visit Neelagiri, Ardyth Man, MD [place] [facility]   03/29/22 Telemedicine Neelagiri, Ardyth Man, MD [place] [facility]   03/25/22 Office Visit Neelagiri, Ardyth Man, MD [place] [facility]   03/22/22 Office Visit Karyl Kinnier, [place] [facility]   02/24/22 Telemedicine Neelagiri, Ardyth Man, [place]  [facility]   Showing recent visits within past 365 days and meeting all other requirements  Future Appointments  No visits were found meeting these conditions.  Showing future appointments within next 365 days and meeting all other requirements       Labs: Creatinine:   Creatinine, Serum (mg/dL)   Date Value   16/10/9602 0.80     Creatinine   Date Value   07/09/2022 0.70 mg/dL   54/09/8117 0.8 mg/dl    Potassium:   Potassium, Serum (mEq/L)   Date Value   12/19/2012 4.1     Potassium (mmol/L)   Date Value   07/09/2022 4.1   01/02/2015 3.9    Sodium:   Sodium (mmol/L)   Date Value   07/09/2022 139   01/02/2015 141    Vitals:   BP Readings from Last 3 Encounters:   08/10/22 122/78   06/08/22 118/80 05/17/22 124/77    and   Pulse Readings from Last 3 Encounters:   08/10/22 69   06/08/22 50   05/17/22 87

## 2022-08-11 NOTE — Unmapped (Addendum)
I reviewed this patient case and all documentation provided by the learner and was readily available for consultation during their interaction with the patient.  I agree with the assessment and plan listed below.    Arnold Long, PharmD   [facility] Specialty Pharmacist    [facility] Refill Coordination Note    Specialty Medication(s) to be Shipped:   CF/Pulmonary/Asthma: Xolair 150 mg/mL    Other medication(s) to be shipped: No additional medications requested for fill at this time     Natalie [name], DOB: Sep 14, 1970  Phone: There are no phone numbers on file.      All above HIPAA information was verified with patient.     Was a Nurse, learning disability used for this call? No    Completed refill call assessment today to schedule patient's medication shipment from the [facility] [phone]).  All relevant notes have been reviewed.     Specialty medication(s) and dose(s) confirmed: Regimen is correct and unchanged.   Changes to medications: Allaura reports no changes at this time.  Changes to insurance: Yes: [employer]  New side effects reported not previously addressed with a pharmacist or physician: None reported  Questions for the pharmacist: No    Confirmed patient received a Conservation officer, historic buildings and a Surveyor, mining with first shipment. The patient will receive a drug information handout for each medication shipped and additional FDA Medication Guides as required.       DISEASE/MEDICATION-SPECIFIC INFORMATION        For patients on injectable medications: Patient currently has 0 doses left.  Next injection is scheduled for 08/21/22.    SPECIALTY MEDICATION ADHERENCE     Were doses missed due to medication being on hold? No    Xolair 150 mg/ml: 0 days of medicine on hand       REFERRAL TO PHARMACIST     Referral to the pharmacist: Not needed      [facility]     Shipping address confirmed in Epic.       Delivery Scheduled: Yes, Expected medication delivery date: 08/17/22.     Medication will be delivered via Same Day Courier to the prescription address in Epic WAM.    Racheal Patches Pharmacy Candidate   [facility] Specialty

## 2022-08-11 NOTE — Unmapped (Signed)
3 month Botox

## 2022-08-12 ENCOUNTER — Ambulatory Visit: Admit: 2022-08-12 | Discharge: 2022-08-13 | Payer: MEDICAID | Attending: Sports Medicine | Primary: Sports Medicine

## 2022-08-12 DIAGNOSIS — G8929 Other chronic pain: Principal | ICD-10-CM

## 2022-08-12 DIAGNOSIS — M170 Bilateral primary osteoarthritis of knee: Principal | ICD-10-CM

## 2022-08-12 DIAGNOSIS — M25561 Pain in right knee: Principal | ICD-10-CM

## 2022-08-12 MED ADMIN — triamcinolone acetonide (KENALOG) injection 20 mg: 20 mg | INTRA_ARTICULAR | @ 15:00:00 | Stop: 2022-08-12 | NDC 00003031520

## 2022-08-12 MED ADMIN — bupivacaine (PF) (MARCAINE) 0.5 % (5 mg/mL) injection (PF) 5 mL: 5 mL | INTRA_ARTICULAR | @ 15:00:00 | Stop: 2022-08-12 | NDC 71288072652

## 2022-08-12 NOTE — Unmapped (Unsigned)
PATIENT: Natalie [name]      AGE: 52 y.o.     DOB: 07/24/1970    Medical Record Number:  [id number]            Date of Exam:  08/12/2022      Primary Care Provider:  Royal Hawthorn, MD       Chief Complaint     No chief complaint on file.       History Of Present Illness   Natalie [name] is a  52 y.o.  female who presents today for evaluation of left knee.  She has been complaining of pain in her knee for the past several years.  She has managed this with viscosupplementation as well as corticosteroid injections.  She underwent her last Visco supplementation nearly a year ago.  It was not quite as effective but she did get relief.  Her prior injections lasted about 2 years.  She complains of pain and stiffness in the knee.  The pain is achy and dull but can become sharp at times.  She is felt some discomfort going up and down stairs as well as pivoting.  She has used naproxen as well as Percocet at times to manage the symptoms.    Today's History Update 07/02/2021 - Patient presents for MRI results review of left knee.  She continues to complain of pain medially in the left knee.  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    Today's History Update 10/01/2021 - Patient presents for follow up of bilateral knees.  Her right knee is improved after the viscosupplementation however her left knee continues to be symptomatic.  She has been complaining of ongoing pain over the past 4 months or so in spite of corticosteroid injection as well as viscosupplementation.  She has noticed pain throughout the knee but notices specifically medially when she pivots on the knee and sits crosslegged.  Her prior MRI showed a tear of the medial meniscus as well as degeneration in the medial compartment    Today's History Update 11/19/2021 - Patient presents for follow up of bilateral knees.  Her low knee is the more symptomatic of the 2.  She noticed the onset of her symptoms 2 to 3 weeks ago.  She was pushing her brother in a wheelchair up and inclined.  She feels that this aggravated her knee.  She denies twisting her knee.  She complains of pain medial in location.  She has had some difficulty walking.  She has had a previous MRI demonstrating a meniscus tear as well as some degeneration in the medial compartment.  She has had some benefit with viscosupplementation but symptoms acutely worse at this point.    Today's History Update 11/26/2021 - Patient presents for follow up of right knee.  She underwent a corticosteroid injection for the left knee which was helpful.  The right knee however has continued to be painful and felt would benefit from a corticosteroid injection.    Today's History Update 03/08/2022 - Patient presents for follow up of left knee.  She has been can planing of increasing knee pain over the past number of weeks.  She was last seen for this knee about 4 months ago.  She has been treated in the past with viscosupplementation with good success.  The right knee has also been symptomatic but not as pronounced as the left.    Today's History Update 08/12/2022 - Patient presents for follow up of right knee. ***  Home Medications     Current Outpatient Medications:     albuterol HFA 90 mcg/actuation inhaler, Inhale 2 puffs every four (4) hours as needed for wheezing., Disp: 54 g, Rfl: 0    azelastine (ASTELIN) 137 mcg (0.1 %) nasal spray, 2 sprays into each nostril two (2) times a day., Disp: 30 mL, Rfl: 0    azelastine (OPTIVAR) 0.05 % ophthalmic solution, Administer 2 drops to both eyes daily as needed., Disp: 6 mL, Rfl: 5    baclofen (LIORESAL) 20 MG tablet, 10 - 20 mg three times a day as needed for spasms., Disp: 90 tablet, Rfl: 2    cetirizine (ZYRTEC) 10 MG chewable tablet, Chew 1 tablet (10 mg total) at bedtime as needed for allergies., Disp: , Rfl:     diclofenac sodium (VOLTAREN) 1 % gel, Apply 2 g topically four (4) times a day., Disp: 100 g, Rfl: 6    empty container Misc, Use as directed to dispose of Xolair syringes, Disp: 1 each, Rfl: 2    EPINEPHrine (EPIPEN) 0.3 mg/0.3 mL injection, Inject 0.3 mL (0.3 mg total) into the muscle once as needed for anaphylaxis (Difficulty breathing, throat closing, etc) for up to 1 dose., Disp: 1 each, Rfl: 12    estradiol (ESTRACE) 0.5 MG tablet, Take 1 tablet (0.5 mg total) by mouth daily., Disp: 30 tablet, Rfl: 1    famotidine (PEPCID) 20 MG tablet, Take 1 tablet (20 mg total) by mouth two (2) times a day., Disp: , Rfl:     fluconazole (DIFLUCAN) 150 MG tablet, Take one tablet once. May repeat after 3 days as needed., Disp: 3 tablet, Rfl: 3    gabapentin (NEURONTIN) 800 MG tablet, Take 1 tablet (800 mg total) by mouth two (2) times a day., Disp: 180 tablet, Rfl: 0    hydroCHLOROthiazide 12.5 MG tablet, TAKE 1 TABLET(12.5 MG) BY MOUTH DAILY AS NEEDED FOR SWELLING, Disp: 90 tablet, Rfl: 1    methylPREDNISolone (MEDROL DOSEPACK) 4 mg tablet, Take 1 tablet (4 mg total) by mouth daily. follow package directions, Disp: 1 each, Rfl: 0    nortriptyline (PAMELOR) 25 MG capsule, Take 1 capsule (25 mg total) by mouth nightly., Disp: 90 capsule, Rfl: 1    omalizumab (XOLAIR) 150 mg/mL syringe, Inject 2 mL (300 mg total) under the skin every fourteen (14) days., Disp: 2 mL, Rfl: 11    omalizumab (XOLAIR) 75 mg/0.5 mL syringe, Inject under the skin., Disp: , Rfl:     oxyCODONE-acetaminophen (PERCOCET) 10-325 mg per tablet, Take 1 tablet by mouth every four (4) hours as needed for pain. Max 4 tabs/day. Fill on or after: 08/05/22. Brand name only due to allergy., Disp: 120 tablet, Rfl: 0    pantoprazole (PROTONIX) 40 MG tablet, TAKE 1 TABLET(40 MG) BY MOUTH DAILY 30 MINUTES BEFORE BREAKFAST AS NEEDED, Disp: 30 tablet, Rfl: 5    potassium chloride 20 MEQ ER tablet, Take 1 tablet (20 mEq total) by mouth daily., Disp: 90 tablet, Rfl: 3    PROCHAMBER Spcr, , Disp: , Rfl:     rosuvastatin (CRESTOR) 10 MG tablet, Take 1 tablet (10 mg total) by mouth nightly., Disp: 90 tablet, Rfl: 3    triamcinolone (KENALOG) 0.1 % cream, Apply 1 application topically two (2) times a day as needed WHEN ALLERGIES FLARE-UP AND CAUSES RASH / ITCHING, Disp: 80 g, Rfl: 0    ubrogepant (UBRELVY) 50 mg tablet, Take 50 mg by mouth for headache. May repeat in 2 hours, if needed.  LIMIT 2 PILLS PER DAY AND NO MORE THAN 4 PILLS PER WEEK., Disp: 16 tablet, Rfl: 3    XHANCE 93 mcg/actuation AerB, 1 spray into each nostril two (2) times a day as needed., Disp: 16 mL, Rfl: 11    Current Facility-Administered Medications:     omalizumab Geoffry Paradise) injection 300 mg, 300 mg, Subcutaneous, Q28 Days, Volertas, Sofija Dalia, MD, 300 mg at 03/09/22 1441     Allergies   Buprenorphine hcl, Pregabalin, Buprenorphine, Cephalexin monohydrate, Doxycycline, Oxycodone hcl, Adhesive tape-silicones, Doxycycline hyclate (bulk), Keflex [cephalexin], Opioids - morphine analogues, and Oxycodone-acetaminophen    Medical History     Past Medical History:   Diagnosis Date    Allergic conjunctivitis 11/23/2018    Anemia 1986    Arthritis     Breast mass     Chondromalacia of left knee     Chondromalacia of right knee     Depressive disorder     Fibrocystic breast     GERD (gastroesophageal reflux disease)     Hematuria     History of kidney stones     History of transfusion     Hyperlipidemia 1999    Hypothyroidism     Lumbar disc disease 1998    She was injured at work at the age of 69 and then had disk surgery 2 years later     Menopause ovarian failure     Menopause, premature     Migraine with aura     Neuromuscular disorder (CMS-HCC) 1999    Obesity (BMI 30-39.9) 11/23/2018    Ovarian cyst     Plantar fasciitis     Pseudotumor cerebri     Rash due to allergy 11/23/2018    Sickle cell trait (CMS-HCC)     Urinary tract infection     Vaginitis        Surgical History     Past Surgical History:   Procedure Laterality Date    BACK SURGERY  2001    BREAST BIOPSY Bilateral     When patient was 16 & 17 Both Negative HYSTERECTOMY  2008    PR REMOVAL OF TONSILS,13+ Y/O Bilateral 10/10/2019    Procedure: TONSILLECTOMY;  Surgeon: Lona Millard, MD;  Location: OR [place];  Service: ENT    SALPINGOOPHORECTOMY Bilateral 2007    SPINE SURGERY  2001    TOTAL VAGINAL HYSTERECTOMY  01/04/2006    Uterine Prolapse-Dr. Leeanne Rio OP Note    TUBAL LIGATION  1995        Social History     Past Medical History:   Diagnosis Date    Allergic conjunctivitis 11/23/2018    Anemia 1986    Arthritis     Breast mass     Chondromalacia of left knee     Chondromalacia of right knee     Depressive disorder     Fibrocystic breast     GERD (gastroesophageal reflux disease)     Hematuria     History of kidney stones     History of transfusion     Hyperlipidemia 1999    Hypothyroidism     Lumbar disc disease 1998    She was injured at work at the age of 42 and then had disk surgery 2 years later     Menopause ovarian failure     Menopause, premature     Migraine with aura     Neuromuscular disorder (CMS-HCC) 1999    Obesity (BMI 30-39.9) 11/23/2018  Ovarian cyst     Plantar fasciitis     Pseudotumor cerebri     Rash due to allergy 11/23/2018    Sickle cell trait (CMS-HCC)     Urinary tract infection     Vaginitis         Family History     Family History   Problem Relation Age of Onset    Hypertension Mother     Heart disease Mother     Arthritis Mother     Depression Mother     Drug abuse Mother     Mental illness Mother     Ulcers Mother     COPD Mother     Heart attack Father     Mental illness Father     Asthma Father     Cancer Sister         lymphoma    Heart attack Maternal Grandmother     Heart disease Maternal Grandmother     Hypertension Maternal Grandmother     Thyroid disease Maternal Grandmother     Stroke Paternal Grandfather     Depression Son     Drug abuse Son     Mental illness Son     COPD Maternal Aunt     Heart attack Cousin 29    Breast cancer Cousin 32    Thyroid disease Other     Alcohol abuse Neg Hx      Family Status Relation Name Status    Mother Annice Pih Alive    Father DJ Deceased    Sister Marvis Repress (Not Specified)    [employer]/textile technologist (Not Specified)    PGF HV (Not Specified)    Son Cloretta Ned (Not Specified)    Mat Aunt Amada Jupiter Deceased    Cousin Maquitta Deceased    Other  (Not Specified)    Neg Hx  (Not Specified)   No partnership data on file       Physical Exam     There were no vitals filed for this visit.         Appearance: Well nourished/well developed.  Psychiatric: Mood and affect appropriate.    Head:  Normocephalic, atraumatic.   Skin:  Clean, dry, no lesions, no rashes.  Neuro:  Alert and oriented.  Respiratory: Unlabored.       FOCUSED EXAM:  Knee Exam: Left knee with no effusion today.  Tenderness medial joint line.  Mild pain with patellar grind.  She was able to walk without a significant limp.  Ligamentously stable.  0 to 120 degrees flexion.                          Imaging       MRI of the left knee was independently reviewed today.  The MRI showed significant area of grade 4 chondral loss involving the posterior aspect of the medial femoral condyle.  There was significant subchondral cystic changes and bone marrow edema.  There was an increase signal within the undersurface of the medial meniscus with subtle undersurface tear.  Articular cartilage on the tibial plateau medially was intact.  Patellofemoral articular cartilage was preserved as was the lateral articular compartment cartilage.      Assessment & Plan     Diagnosis:   No diagnosis found.            Prescription:      Your Medication List  Accurate as of August 12, 2022  8:22 AM. If you have any questions, ask your nurse or doctor.                CONTINUE taking these medications      albuterol 90 mcg/actuation inhaler  Commonly known as: PROVENTIL HFA;VENTOLIN HFA  Inhale 2 puffs every four (4) hours as needed for wheezing.     azelastine 0.05 % ophthalmic solution  Commonly known as: OPTIVAR  Administer 2 drops to both eyes daily as needed. azelastine 137 mcg (0.1 %) nasal spray  Commonly known as: ASTELIN  2 sprays into each nostril two (2) times a day.     baclofen 20 MG tablet  Commonly known as: LIORESAL  10 - 20 mg three times a day as needed for spasms.     cetirizine 10 MG chewable tablet  Commonly known as: ZYRTEC  Chew 1 tablet (10 mg total) at bedtime as needed for allergies.     diclofenac sodium 1 % gel  Commonly known as: VOLTAREN  Apply 2 g topically four (4) times a day.     empty container Misc  Use as directed to dispose of Xolair syringes     EPINEPHrine 0.3 mg/0.3 mL injection  Commonly known as: EPIPEN  Inject 0.3 mL (0.3 mg total) into the muscle once as needed for anaphylaxis (Difficulty breathing, throat closing, etc) for up to 1 dose.     estradiol 0.5 MG tablet  Commonly known as: ESTRACE  Take 1 tablet (0.5 mg total) by mouth daily.     famotidine 20 MG tablet  Commonly known as: PEPCID  Take 1 tablet (20 mg total) by mouth two (2) times a day.     fluconazole 150 MG tablet  Commonly known as: DIFLUCAN  Take one tablet once. May repeat after 3 days as needed.     gabapentin 800 MG tablet  Commonly known as: NEURONTIN  Take 1 tablet (800 mg total) by mouth two (2) times a day.     hydroCHLOROthiazide 12.5 MG tablet  TAKE 1 TABLET(12.5 MG) BY MOUTH DAILY AS NEEDED FOR SWELLING     methylPREDNISolone 4 mg tablet  Commonly known as: MEDROL DOSEPACK  Take 1 tablet (4 mg total) by mouth daily. follow package directions     nortriptyline 25 MG capsule  Commonly known as: PAMELOR  Take 1 capsule (25 mg total) by mouth nightly.     oxyCODONE-acetaminophen 10-325 mg per tablet  Commonly known as: PERCOCET  Take 1 tablet by mouth every four (4) hours as needed for pain. Max 4 tabs/day. Fill on or after: 08/05/22. Brand name only due to allergy.     pantoprazole 40 MG tablet  Commonly known as: Protonix  TAKE 1 TABLET(40 MG) BY MOUTH DAILY 30 MINUTES BEFORE BREAKFAST AS NEEDED     potassium chloride 20 MEQ ER tablet  Take 1 tablet (20 mEq total) by mouth daily.     PROCHAMBER Spcr  Generic drug: inhalational spacing device     rosuvastatin 10 MG tablet  Commonly known as: CRESTOR  Take 1 tablet (10 mg total) by mouth nightly.     triamcinolone 0.1 % cream  Commonly known as: KENALOG  Apply 1 application topically two (2) times a day as needed WHEN ALLERGIES FLARE-UP AND CAUSES RASH / ITCHING     ubrogepant 50 mg tablet  Commonly known as: UBRELVY  Take 50 mg by mouth for headache. May repeat in 2 hours,  if needed. LIMIT 2 PILLS PER DAY AND NO MORE THAN 4 PILLS PER WEEK.     XHANCE 93 mcg/actuation Aerb  Generic drug: fluticasone propionate  1 spray into each nostril two (2) times a day as needed.     XOLAIR 75 mg/0.5 mL syringe  Generic drug: omalizumab  Inject under the skin.     XOLAIR 150 mg/mL syringe  Generic drug: omalizumab  Inject 2 mL (300 mg total) under the skin every fourteen (14) days.              Plan: I reviewed with her the findings.  Unfortunately the arthritis has progressed to a modest degree in this left knee and this continues to be symptomatic.  We discussed that it does not show advancement to the point that she would need a knee replacement and she has had good success with viscosupplementation.  The viscosupplementation has allowed her to increase her activity level and avoid medication use to manage the pain.  We are therefore going to repeat a viscosupplementation series and will continue to repeat this as needed as long as beneficial.    The patient reports increased function, decreased pain and decreased need for anti-inflammatory and pain medications following the last viscosupplementation injection series.    Reed Pandy Malillany Kazlauskas, CMA  Date: 08/12/2022  Time: 8:22 AM

## 2022-08-12 NOTE — Unmapped (Signed)
PATIENT: Natalie [name]      AGE: 52 y.o.     DOB: Apr 29, 1970    Medical Record Number:  [id number]            Date of Exam:  08/12/2022      Primary Care Provider:  Royal Hawthorn, MD       Chief Complaint     Chief Complaint   Patient presents with    Left Knee - Follow-up        History Of Present Illness   Lenesha Waxman is a  52 y.o.  female who presents today for evaluation of left knee.  She has been complaining of pain in her knee for the past several years.  She has managed this with viscosupplementation as well as corticosteroid injections.  She underwent her last Visco supplementation nearly a year ago.  It was not quite as effective but she did get relief.  Her prior injections lasted about 2 years.  She complains of pain and stiffness in the knee.  The pain is achy and dull but can become sharp at times.  She is felt some discomfort going up and down stairs as well as pivoting.  She has used naproxen as well as Percocet at times to manage the symptoms.    Today's History Update 07/02/2021 - Patient presents for MRI results review of left knee.  She continues to complain of pain medially in the left knee.  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    Today's History Update 10/01/2021 - Patient presents for follow up of bilateral knees.  Her right knee is improved after the viscosupplementation however her left knee continues to be symptomatic.  She has been complaining of ongoing pain over the past 4 months or so in spite of corticosteroid injection as well as viscosupplementation.  She has noticed pain throughout the knee but notices specifically medially when she pivots on the knee and sits crosslegged.  Her prior MRI showed a tear of the medial meniscus as well as degeneration in the medial compartment    Today's History Update 11/19/2021 - Patient presents for follow up of bilateral knees.  Her low knee is the more symptomatic of the 2. She noticed the onset of her symptoms 2 to 3 weeks ago.  She was pushing her brother in a wheelchair up and inclined.  She feels that this aggravated her knee.  She denies twisting her knee.  She complains of pain medial in location.  She has had some difficulty walking.  She has had a previous MRI demonstrating a meniscus tear as well as some degeneration in the medial compartment.  She has had some benefit with viscosupplementation but symptoms acutely worse at this point.    Today's History Update 11/26/2021 - Patient presents for follow up of right knee.  She underwent a corticosteroid injection for the left knee which was helpful.  The right knee however has continued to be painful and felt would benefit from a corticosteroid injection.    Today's History Update 03/08/2022 - Patient presents for follow up of left knee.  She has been can planing of increasing knee pain over the past number of weeks.  She was last seen for this knee about 4 months ago.  She has been treated in the past with viscosupplementation with good success.  The right knee has also been symptomatic but not as pronounced as the left.    Today's History Update 08/12/2022 -  Patient presents for follow up of left knee.  The prior corticosteroid injection was performed over 5 months ago and helped until about the past 2 weeks.  She has had increasing pain and stiffness.  The pain is localized to the posterior aspect of her knee.    Home Medications     Current Outpatient Medications:     azelastine (ASTELIN) 137 mcg (0.1 %) nasal spray, 2 sprays into each nostril two (2) times a day., Disp: 30 mL, Rfl: 0    azelastine (OPTIVAR) 0.05 % ophthalmic solution, Administer 2 drops to both eyes daily as needed., Disp: 6 mL, Rfl: 5    baclofen (LIORESAL) 20 MG tablet, 10 - 20 mg three times a day as needed for spasms., Disp: 90 tablet, Rfl: 2    cetirizine (ZYRTEC) 10 MG chewable tablet, Chew 1 tablet (10 mg total) at bedtime as needed for allergies., Disp: , Rfl:     diclofenac sodium (VOLTAREN) 1 % gel, Apply 2 g topically four (4) times a day., Disp: 100 g, Rfl: 6    empty container Misc, Use as directed to dispose of Xolair syringes, Disp: 1 each, Rfl: 2    EPINEPHrine (EPIPEN) 0.3 mg/0.3 mL injection, Inject 0.3 mL (0.3 mg total) into the muscle once as needed for anaphylaxis (Difficulty breathing, throat closing, etc) for up to 1 dose., Disp: 1 each, Rfl: 12    estradiol (ESTRACE) 0.5 MG tablet, Take 1 tablet (0.5 mg total) by mouth daily., Disp: 30 tablet, Rfl: 1    famotidine (PEPCID) 20 MG tablet, Take 1 tablet (20 mg total) by mouth two (2) times a day., Disp: , Rfl:     fluconazole (DIFLUCAN) 150 MG tablet, Take one tablet once. May repeat after 3 days as needed., Disp: 3 tablet, Rfl: 3    gabapentin (NEURONTIN) 800 MG tablet, Take 1 tablet (800 mg total) by mouth two (2) times a day., Disp: 180 tablet, Rfl: 0    hydroCHLOROthiazide 12.5 MG tablet, TAKE 1 TABLET(12.5 MG) BY MOUTH DAILY AS NEEDED FOR SWELLING, Disp: 90 tablet, Rfl: 1    methylPREDNISolone (MEDROL DOSEPACK) 4 mg tablet, Take 1 tablet (4 mg total) by mouth daily. follow package directions, Disp: 1 each, Rfl: 0    nortriptyline (PAMELOR) 25 MG capsule, Take 1 capsule (25 mg total) by mouth nightly., Disp: 90 capsule, Rfl: 1    omalizumab (XOLAIR) 150 mg/mL syringe, Inject 2 mL (300 mg total) under the skin every fourteen (14) days., Disp: 2 mL, Rfl: 11    omalizumab (XOLAIR) 75 mg/0.5 mL syringe, Inject under the skin., Disp: , Rfl:     oxyCODONE-acetaminophen (PERCOCET) 10-325 mg per tablet, Take 1 tablet by mouth every four (4) hours as needed for pain. Max 4 tabs/day. Fill on or after: 08/05/22. Brand name only due to allergy., Disp: 120 tablet, Rfl: 0    pantoprazole (PROTONIX) 40 MG tablet, TAKE 1 TABLET(40 MG) BY MOUTH DAILY 30 MINUTES BEFORE BREAKFAST AS NEEDED, Disp: 30 tablet, Rfl: 5    potassium chloride 20 MEQ ER tablet, Take 1 tablet (20 mEq total) by mouth daily., Disp: 90 tablet, Rfl: 3    PROCHAMBER Spcr, , Disp: , Rfl:     rosuvastatin (CRESTOR) 10 MG tablet, Take 1 tablet (10 mg total) by mouth nightly., Disp: 90 tablet, Rfl: 3    triamcinolone (KENALOG) 0.1 % cream, Apply 1 application topically two (2) times a day as needed WHEN ALLERGIES FLARE-UP AND CAUSES RASH / ITCHING, Disp:  80 g, Rfl: 0    ubrogepant (UBRELVY) 50 mg tablet, Take 50 mg by mouth for headache. May repeat in 2 hours, if needed. LIMIT 2 PILLS PER DAY AND NO MORE THAN 4 PILLS PER WEEK., Disp: 16 tablet, Rfl: 3    XHANCE 93 mcg/actuation AerB, 1 spray into each nostril two (2) times a day as needed., Disp: 16 mL, Rfl: 11    albuterol HFA 90 mcg/actuation inhaler, Inhale 2 puffs every four (4) hours as needed for wheezing., Disp: 54 g, Rfl: 0    Current Facility-Administered Medications:     omalizumab Geoffry Paradise) injection 300 mg, 300 mg, Subcutaneous, Q28 Days, Volertas, Sofija Dalia, MD, 300 mg at 03/09/22 1441     Allergies   Buprenorphine hcl, Pregabalin, Buprenorphine, Cephalexin monohydrate, Doxycycline, Oxycodone hcl, Adhesive tape-silicones, Doxycycline hyclate (bulk), Keflex [cephalexin], Opioids - morphine analogues, and Oxycodone-acetaminophen    Medical History     Past Medical History:   Diagnosis Date    Allergic conjunctivitis 11/23/2018    Anemia 1986    Arthritis     Breast mass     Chondromalacia of left knee     Chondromalacia of right knee     Depressive disorder     Fibrocystic breast     GERD (gastroesophageal reflux disease)     Hematuria     History of kidney stones     History of transfusion     Hyperlipidemia 1999    Hypothyroidism     Lumbar disc disease 1998    She was injured at work at the age of 45 and then had disk surgery 2 years later     Menopause ovarian failure     Menopause, premature     Migraine with aura     Neuromuscular disorder (CMS-HCC) 1999    Obesity (BMI 30-39.9) 11/23/2018    Ovarian cyst     Plantar fasciitis     Pseudotumor cerebri     Rash due to allergy 11/23/2018 Sickle cell trait (CMS-HCC)     Urinary tract infection     Vaginitis        Surgical History     Past Surgical History:   Procedure Laterality Date    BACK SURGERY  2001    BREAST BIOPSY Bilateral     When patient was 16 & 17 Both Negative    HYSTERECTOMY  2008    PR REMOVAL OF TONSILS,13+ Y/O Bilateral 10/10/2019    Procedure: TONSILLECTOMY;  Surgeon: Lona Millard, MD;  Location: OR [facility];  Service: ENT    SALPINGOOPHORECTOMY Bilateral 2007    SPINE SURGERY  2001    TOTAL VAGINAL HYSTERECTOMY  01/04/2006    Uterine Prolapse-Dr. Leeanne Rio OP Note    TUBAL LIGATION  1995        Social History     Past Medical History:   Diagnosis Date    Allergic conjunctivitis 11/23/2018    Anemia 1986    Arthritis     Breast mass     Chondromalacia of left knee     Chondromalacia of right knee     Depressive disorder     Fibrocystic breast     GERD (gastroesophageal reflux disease)     Hematuria     History of kidney stones     History of transfusion     Hyperlipidemia 1999    Hypothyroidism     Lumbar disc disease 1998    She was injured at work at  the age of 62 and then had disk surgery 2 years later     Menopause ovarian failure     Menopause, premature     Migraine with aura     Neuromuscular disorder (CMS-HCC) 1999    Obesity (BMI 30-39.9) 11/23/2018    Ovarian cyst     Plantar fasciitis     Pseudotumor cerebri     Rash due to allergy 11/23/2018    Sickle cell trait (CMS-HCC)     Urinary tract infection     Vaginitis         Family History     Family History   Problem Relation Age of Onset    Hypertension Mother     Heart disease Mother     Arthritis Mother     Depression Mother     Drug abuse Mother     Mental illness Mother     Ulcers Mother     COPD Mother     Heart attack Father     Mental illness Father     Asthma Father     Cancer Sister         lymphoma    Heart attack Maternal Grandmother     Heart disease Maternal Grandmother     Hypertension Maternal Grandmother     Thyroid disease Maternal Grandmother     Stroke Paternal Grandfather     Depression Son     Drug abuse Son     Mental illness Son     COPD Maternal Aunt     Heart attack Cousin 18    Breast cancer Cousin 32    Thyroid disease Other     Alcohol abuse Neg Hx      Family Status   Relation Name Status    Mother Annice Pih Alive    Father DJ Deceased    Sister Therapist, nutritional (Not Specified)    [employer]/textile technologist (Not Specified)    PGF HV (Not Specified)    Son Cloretta Ned (Not Specified)    Mat Aunt Amada Jupiter Deceased    Cousin Maquitta Deceased    Other  (Not Specified)    Neg Hx  (Not Specified)   No partnership data on file       Physical Exam          08/12/22 1129   BP: 121/81   Pulse: 69   Weight: 73.5 kg (162 lb)   Height: 152.4 cm (5')          Appearance: Well nourished/well developed.  Psychiatric: Mood and affect appropriate.    Head:  Normocephalic, atraumatic.   Skin:  Clean, dry, no lesions, no rashes.  Neuro:  Alert and oriented.  Respiratory: Unlabored.       FOCUSED EXAM:  Knee Exam: Left knee with no effusion today.  Tenderness medial joint line.  Mild pain with patellar grind.  She was able to walk without a significant limp.  Ligamentously stable.  0 to 120 degrees flexion.                          Imaging       MRI of the left knee was independently reviewed today.  The MRI showed significant area of grade 4 chondral loss involving the posterior aspect of the medial femoral condyle.  There was significant subchondral cystic changes and bone marrow edema.  There was an increase signal within the undersurface of the medial meniscus with subtle undersurface  tear.  Articular cartilage on the tibial plateau medially was intact.  Patellofemoral articular cartilage was preserved as was the lateral articular compartment cartilage.      Assessment & Plan     Diagnosis:   1. Chronic pain of right knee    2. Primary osteoarthritis of both knees                Prescription:      Your Medication List            Accurate as of August 12, 2022 11:51 AM. If you have any questions, ask your nurse or doctor.                CONTINUE taking these medications      albuterol 90 mcg/actuation inhaler  Commonly known as: PROVENTIL HFA;VENTOLIN HFA  Inhale 2 puffs every four (4) hours as needed for wheezing.     azelastine 0.05 % ophthalmic solution  Commonly known as: OPTIVAR  Administer 2 drops to both eyes daily as needed.     azelastine 137 mcg (0.1 %) nasal spray  Commonly known as: ASTELIN  2 sprays into each nostril two (2) times a day.     baclofen 20 MG tablet  Commonly known as: LIORESAL  10 - 20 mg three times a day as needed for spasms.     cetirizine 10 MG chewable tablet  Commonly known as: ZYRTEC  Chew 1 tablet (10 mg total) at bedtime as needed for allergies.     diclofenac sodium 1 % gel  Commonly known as: VOLTAREN  Apply 2 g topically four (4) times a day.     empty container Misc  Use as directed to dispose of Xolair syringes     EPINEPHrine 0.3 mg/0.3 mL injection  Commonly known as: EPIPEN  Inject 0.3 mL (0.3 mg total) into the muscle once as needed for anaphylaxis (Difficulty breathing, throat closing, etc) for up to 1 dose.     estradiol 0.5 MG tablet  Commonly known as: ESTRACE  Take 1 tablet (0.5 mg total) by mouth daily.     famotidine 20 MG tablet  Commonly known as: PEPCID  Take 1 tablet (20 mg total) by mouth two (2) times a day.     fluconazole 150 MG tablet  Commonly known as: DIFLUCAN  Take one tablet once. May repeat after 3 days as needed.     gabapentin 800 MG tablet  Commonly known as: NEURONTIN  Take 1 tablet (800 mg total) by mouth two (2) times a day.     hydroCHLOROthiazide 12.5 MG tablet  TAKE 1 TABLET(12.5 MG) BY MOUTH DAILY AS NEEDED FOR SWELLING     methylPREDNISolone 4 mg tablet  Commonly known as: MEDROL DOSEPACK  Take 1 tablet (4 mg total) by mouth daily. follow package directions     nortriptyline 25 MG capsule  Commonly known as: PAMELOR  Take 1 capsule (25 mg total) by mouth nightly.     oxyCODONE-acetaminophen 10-325 mg per tablet  Commonly known as: PERCOCET  Take 1 tablet by mouth every four (4) hours as needed for pain. Max 4 tabs/day. Fill on or after: 08/05/22. Brand name only due to allergy.     pantoprazole 40 MG tablet  Commonly known as: Protonix  TAKE 1 TABLET(40 MG) BY MOUTH DAILY 30 MINUTES BEFORE BREAKFAST AS NEEDED     potassium chloride 20 MEQ ER tablet  Take 1 tablet (20 mEq total) by mouth daily.  PROCHAMBER Spcr  Generic drug: inhalational spacing device     rosuvastatin 10 MG tablet  Commonly known as: CRESTOR  Take 1 tablet (10 mg total) by mouth nightly.     triamcinolone 0.1 % cream  Commonly known as: KENALOG  Apply 1 application topically two (2) times a day as needed WHEN ALLERGIES FLARE-UP AND CAUSES RASH / ITCHING     ubrogepant 50 mg tablet  Commonly known as: UBRELVY  Take 50 mg by mouth for headache. May repeat in 2 hours, if needed. LIMIT 2 PILLS PER DAY AND NO MORE THAN 4 PILLS PER WEEK.     XHANCE 93 mcg/actuation Aerb  Generic drug: fluticasone propionate  1 spray into each nostril two (2) times a day as needed.     XOLAIR 75 mg/0.5 mL syringe  Generic drug: omalizumab  Inject under the skin.     XOLAIR 150 mg/mL syringe  Generic drug: omalizumab  Inject 2 mL (300 mg total) under the skin every fourteen (14) days.              Plan: She had good relief with the corticosteroid injection until recently.  She is also had viscosupplementation with relief of her symptoms.  We elected proceed with a corticosteroid injection today to provide her with some relief.  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  Will have her follow-up in the next 4 months to reassess    Lg Joint Inj: L knee on 08/12/2022 11:15 AM  Indications: pain  Details: 22 G needle  Laterality: left  Location: knee  Medications: 20 mg triamcinolone acetonide 10 mg/mL; 5 mL bupivacaine (PF) 0.5 % (5 mg/mL)  Procedure, treatment alternatives, risks and benefits explained, specific risks discussed. Consent was given by the patient. Immediately prior to procedure a time out was called to verify the correct patient, procedure, equipment, support staff and site/side marked as required. Patient was prepped and draped in the usual sterile fashion.     Provider Attestation: The information documented by members of my medical care team was reviewed and verified for accuracy by me.        The patient reports increased function, decreased pain and decreased need for anti-inflammatory and pain medications following the last viscosupplementation injection series.    Lorraine Lax, MD  Date: 08/12/2022  Time: 11:51 AM

## 2022-08-13 NOTE — Unmapped (Signed)
Botox for Migraine procedure note  08/11/2022       Patient:  Natalie [name] DOB:  1970-07-29   MRN:  [id number]        HISTORY:  52 y.o. year old female with history of chronic migraine/headaches.     PRESERVICE: On the day of the procedure, consent was obtained from patient. Patients name and date of birth was verified. Procedure was explained to the patient and questions answered. A copy of full prescribing information for Botox was given to the patient and reviewed with the patient.     INTRASERVICE: Patient assisted onto the exam table. Injection sites were cleaned with alcohol swab stick. After explaining the procedure and potential complications including weakness, informed consent was obtained. Two 100 Unit vials of botulinum toxin   NDC is [phone] for all Botox     were mixed carefully with 4 cc of sodium chloride 0.9 %.     Botox was transferred into a sterile 30- gauge, 0.5-inch needle (4 syringed total). Botox was administered by injection into 7 fixed- site head/neck regions as follows: frontalis (20 U in 4 sites), corrugator (10U in 2 sites); procerus (5U in 1 site); occipitalis (30U in 6 sites); temporalis (40 U in 8 sites); trapezius (30 U in 6 sites); and the cervical paraspinal muscle group (20 U in 4 sites) for total administered dose 155 U (across 31 sites). With exception of an injection into the procerus, which was injected at 1 site (midline); all muscles were injected bilaterally.     POSTSERVICE:  The injection materials and empty Botox vial was discarded by nurse/physician in biohazard waste. 45 U of Botox wasted by nurse/physician. Reviewed common side effects to include neck pain, headache, dry mouth, droopy eye lids and discomfort at injection sites. Patient tolerated procedure well.     Naila Elizondo Erasmo Score, MD  Electronic Signature  08/13/2022 11:13 AM

## 2022-08-17 MED FILL — XOLAIR 150 MG/ML SUBCUTANEOUS SYRINGE: SUBCUTANEOUS | 28 days supply | Qty: 4 | Fill #2

## 2022-08-17 NOTE — Unmapped (Signed)
Scanned into chart [employer] [facility] Albuterol AWR HFA is not on plans' Drug list temporary supply given to the patient .    Alternative  Albuterol HFA( Proair) tier 2  Ventolin HFA tier 3  Levalbuterol HFA  tier 3

## 2022-08-18 DIAGNOSIS — R0789 Other chest pain: Principal | ICD-10-CM

## 2022-08-18 DIAGNOSIS — J301 Allergic rhinitis due to pollen: Principal | ICD-10-CM

## 2022-08-18 MED ORDER — ALBUTEROL SULFATE HFA 90 MCG/ACTUATION AEROSOL INHALER
Freq: Four times a day (QID) | RESPIRATORY_TRACT | 0 refills | 0.00000 days | Status: CP | PRN
Start: 2022-08-18 — End: 2023-08-18

## 2022-08-18 NOTE — Unmapped (Unsigned)
Department of Anesthesiology  [name]  [address], [address]  [place], [place] [address]  [phone]    Chronic Pain Follow Up Note  No diagnosis found.    Assessment and Plan  Attending: [name] is a 52 y.o. female who is being followed at the [name] with a history of chronic pain localized to bilateral shoulder, knee as well as cervical thoracic and lumbar pain with history of L5-S1 spinal fusion and the pain is associated with bilateral radiculopathy. She was first seen in [name] in August 2018. She has a history of migraines and pseudotumor cerebri, followed by neurology and ophthalmology for treatment.  She has had moderate relief with bilateral knee injections in the past. She stated that she received a caudal epidural steroid injection in the past with good results. She has also had trigger point injections for her paracervical, shoulders, upper mid and lower back areas in 2017.  We have since repeated her caudal ESI and TPI to good effect.  She had a flare of pain in early 2019 and was overtaking her percocet.  After injections pain improved and so did compliance with medications.  Compliance concerns in 2021 that led to additional oversight and then transitioning to [name] in 10/2019.   However, she did not tolerate belbuca or nucynta and was transitioned back to Percocet but with closer oversight and assistance from pain psychology. She had a flare of pain after her L3-5 RFAs in November 2022.  Between medications and procedures, she has been able to continue working.  She now teaches water aerobics.      July 2024  Patient was last seen in March with [name], at which point the patient presented reporting her chronic pain was unchanged, though she had been experiencing acute L flank pain with associated L leg numbness for the past 3 weeks. Although most CT imaging from recent ED visit on 04/01/22 did not show presence of ureteral stones, the etiology of her acute pain was still unclear. Given her new onset symptoms, we planned to initiate celecoxib 200 mg daily for short term use, acetaminophen 500 mg BID PRN, and gabapentin 100 mg daily in the morning. Patient had a history of sedation with current gabapentin 800 mg nightly, thus we planned to be cautious with initiating gabapentin daytime dose. Regarding chronic pain, patient felt that current medications were providing adequate pain control, thus we refilled her oxycodone/APAP and baclofen without change. We advised the patient to follow up with her PCP regarding her flank pain and she planned to follow up with [name] for evaluation of L leg numbness.     Today, ***    She has had quite a few CSI with orthopedics, and we discussed the need to limit steroid exposures due to likely side effects of repeat exposures, especially in a short period of time.  Goal is 3-4x/year.     Myofascial pain syndrome   ***  - Repeat TPIs scheduled  - Continue Baclofen 20 mg TID, refilled x3 months    Cervicalgia  ***  - Reviewed patient's XR imaging again  - Patient has positive facet loading bilaterally as well as paraspinal muscle tenderness in the cervical area, worse on the right than the left  - Cervical MBB/RFA scheduled for Jan  -She has failed medications, TPI, PT, HEP    Lumbosacral radiculopathy    ***  Having sustained relief following caudal ESI in August-70%  -  Continue Gabapentin 800 mg TID, refilled x3 months  - Continue Voltaren gel  - Continue Nortriptyline 50 mg at bedtime per Neurology    Chronic pain syndrome   ***  - Continue Percocet 10-325 mg QID prn, refilled x 3 months   - UDS performed today  - Patient has been exercising with benefit.    The patient does appear to be utilizing pain medications appropriately and does report that the medications do improve patient's quality of life and functionality level.     Last pain medication agreement on file and signed on 01/11/22  Last urine toxicology:  01/11/22.  Appropriate.  Patient did bring bottles for pill counts today; counts appropriate  NCCSRS database was reviewed today and is appropriate:    Last Opioid Change:  Percocet 10-325 decreased from 5x daily PRN to 4x daily PRN 05/2016, 01/2018-increase to #132, several changes in Fall 2021 due to compliance concerns, 11/2019-back to percocet #90, 05/2020-back to #120  Previous Compliance Issues: Did not bring medications 10/2016, no show 04/2017, overtaking 06/2017, short on 09/27/17, lost Rx 10/2017, 01/2018-too late to be seen, 04/2019- 4 days short, but reports some tabs in med box at home. 07/2019 - short, states that she left 28 pills at home and 5 in her car. 08/2019- short, says she overtook during acute increase in pain in setting of acute sinusitis/strep throat. 09/2019- short, says she is unsure why her pill count is low and denies overuse. 10/2019 Did not bring Belbuca to appointment, 11/28/2019 - Did not bring Percocet for pill count, 06/2021 - Did not bring Percocet  Oral Morphine Equivalents: 60  On a Benzodiazepine: no   Naloxone last Ordered:  12/31/2020   NCCSRS database was reviewed 08/18/22.    I have reviewed the [facility] Medical Board statement on use of controlled substances for the treatment of pain as well as the CDC Guideline for Prescribing Opioids for Chronic Pain. I have reviewed the [place] Controlled Substance Monitoring Database.    No follow-ups on file.    Future Considerations:  Repeat caudal ESI  Ensure continued f/u with [name]    Requested Prescriptions      No prescriptions requested or ordered in this encounter     No orders of the defined types were placed in this encounter.    Risks and benefits of above medications including but not limited to possibility of respiratory depression, sedation, and even death were discussed with the patient who expressed an understanding.     Urine toxicology screen is due today.  Treatment agreement renewal is due today.      Patient was advised to bring all medications to each appointment, including those in her pill boxes at home.    HPI  [name] is a 52 y.o. being followed at [name] for complaint of chronic pain localized to her neck, upper back, lower back, bilateral hips and knees.      Patient was last seen in April with [name]. Since then, the patient has followed with Pain Psych. The patient underwent TPI's with our [name] on 05/03/22 and RFA of bilateral C4-6 on 05/11/22. The patient unnderwent Botox for Migraines on 08/11/22.     Today, ***    Patients current medication regimen:  - Percocet 10-325 mg q6h prn   - Gabapentin 100/800 mg daily   - Celebrex 200 mg daily   - Voltaren gel-using regularly  - Baclofen 20mg  TID  - Nortriptyline 50 mg at  bedtime (per neurologist)    The patient states her pain is located *** and the severity of her pain ranges from ***/10 to ***/10.  Her pain currently is ***/10 and on average is ***/10.  She describes the sensation of her pain as {pain described:58928}. Her pain is present {pain frequency:58931} and worst {pain worst:59685}. The patient???s pain impacts {negatively affected:59709}. Her interval history includes {interval history:59710}. Her pain {pain changed:59711}, and she {does does not blank:47747} have new pain to discuss today. She {is/is not:36772} on blood thinners or anti-coagulants. In regards to medications currently taken for pain management, the patient {is:54246::is} tolerating these medications well and complains of associated side effects: {pain med side effects:59712}.    Patient denies homicidal/suicidal ideation.     Allergies  Allergies   Allergen Reactions    Buprenorphine Hcl Itching and Swelling    Pregabalin Swelling    Buprenorphine     Cephalexin Monohydrate     Doxycycline     Oxycodone Hcl     Adhesive Tape-Silicones Itching     Band-aids ok.    Doxycycline Hyclate (Bulk) Nausea And Vomiting     GI Upset Keflex [Cephalexin] Rash    Opioids - Morphine Analogues Itching    Oxycodone-Acetaminophen Itching and Nausea And Vomiting     GI Upset- GENERIC ONLY- able to take the brand name Percocets     Home Medications    Current Outpatient Medications   Medication Sig Dispense Refill    albuterol HFA 90 mcg/actuation inhaler Inhale 2 puffs every four (4) hours as needed for wheezing. 54 g 0    azelastine (ASTELIN) 137 mcg (0.1 %) nasal spray 2 sprays into each nostril two (2) times a day. 30 mL 0    azelastine (OPTIVAR) 0.05 % ophthalmic solution Administer 2 drops to both eyes daily as needed. 6 mL 5    baclofen (LIORESAL) 20 MG tablet 10 - 20 mg three times a day as needed for spasms. 90 tablet 2    cetirizine (ZYRTEC) 10 MG chewable tablet Chew 1 tablet (10 mg total) at bedtime as needed for allergies.      diclofenac sodium (VOLTAREN) 1 % gel Apply 2 g topically four (4) times a day. 100 g 6    empty container Misc Use as directed to dispose of Xolair syringes 1 each 2    EPINEPHrine (EPIPEN) 0.3 mg/0.3 mL injection Inject 0.3 mL (0.3 mg total) into the muscle once as needed for anaphylaxis (Difficulty breathing, throat closing, etc) for up to 1 dose. 1 each 12    estradiol (ESTRACE) 0.5 MG tablet Take 1 tablet (0.5 mg total) by mouth daily. 30 tablet 1    famotidine (PEPCID) 20 MG tablet Take 1 tablet (20 mg total) by mouth two (2) times a day.      fluconazole (DIFLUCAN) 150 MG tablet Take one tablet once. May repeat after 3 days as needed. 3 tablet 3    gabapentin (NEURONTIN) 800 MG tablet Take 1 tablet (800 mg total) by mouth two (2) times a day. 180 tablet 0    hydroCHLOROthiazide 12.5 MG tablet TAKE 1 TABLET(12.5 MG) BY MOUTH DAILY AS NEEDED FOR SWELLING 90 tablet 1    methylPREDNISolone (MEDROL DOSEPACK) 4 mg tablet Take 1 tablet (4 mg total) by mouth daily. follow package directions 1 each 0    nortriptyline (PAMELOR) 25 MG capsule Take 1 capsule (25 mg total) by mouth nightly. 90 capsule 1    omalizumab (XOLAIR) 150 mg/mL  syringe Inject 2 mL (300 mg total) under the skin every fourteen (14) days. 2 mL 11    omalizumab (XOLAIR) 75 mg/0.5 mL syringe Inject under the skin.      oxyCODONE-acetaminophen (PERCOCET) 10-325 mg per tablet Take 1 tablet by mouth every four (4) hours as needed for pain. Max 4 tabs/day. Fill on or after: 08/05/22. Brand name only due to allergy. 120 tablet 0    pantoprazole (PROTONIX) 40 MG tablet TAKE 1 TABLET(40 MG) BY MOUTH DAILY 30 MINUTES BEFORE BREAKFAST AS NEEDED 30 tablet 5    potassium chloride 20 MEQ ER tablet Take 1 tablet (20 mEq total) by mouth daily. 90 tablet 3    PROCHAMBER Spcr       rosuvastatin (CRESTOR) 10 MG tablet Take 1 tablet (10 mg total) by mouth nightly. 90 tablet 3    triamcinolone (KENALOG) 0.1 % cream Apply 1 application topically two (2) times a day as needed WHEN ALLERGIES FLARE-UP AND CAUSES RASH / ITCHING 80 g 0    ubrogepant (UBRELVY) 50 mg tablet Take 50 mg by mouth for headache. May repeat in 2 hours, if needed. LIMIT 2 PILLS PER DAY AND NO MORE THAN 4 PILLS PER WEEK. 16 tablet 3    XHANCE 93 mcg/actuation AerB 1 spray into each nostril two (2) times a day as needed. 16 mL 11     Current Facility-Administered Medications   Medication Dose Route Frequency Provider Last Rate Last Admin    omalizumab Geoffry Paradise) injection 300 mg  300 mg Subcutaneous Q28 Days Volertas, Sofija Dalia, MD   300 mg at 03/09/22 1441     Previous Medication Trials:  NSAIDS- celebrex, ibuprofen, naproxen,   Antidepressants-   Anticonvulsant- gabapentin, pregabalin,   Muscle relaxants- baclofen, flexeril, valium, skelaxin, robaxin, tizanidine  Topicals-voltaren gel, lidoderm,   Short-acting opiates-hydromorphone, percocet, oxycodone, tramadol, ultracet, vicodin, nucynta IR  Long-acting opiates- fentanyl, morphine, methadone, oxycontin. Sedation with long acting opioids. Belbuca (side effects)  Anxiolytics-   Other-     Previous Interventions  PT/Aquatic therapy/TENS/Meidcation/Epidural injections/Nerve Block/ Other injections/Surgery/Psychological Counseling  Before our [name]:  - L3-5 MBBB 11/2015 with good benefit; repeat did not help per patient  - B/l trochanteric injections 10/2014  - TPI 11/2015 upper neck to lower back  - B/l knee injections 2017  - Caudal epidural steroid injection (unsure of timing) with improvement in radiculopathy symptoms  Previous tests include MRI, XRAYs, CT scan and NCS/EMG  [name]  Caudal ESI 10/20/16, 01/2017, 07/14/17, 10/27/17, 03/22/18, 11/16/18, 05/25/19,11/26/19  Knee injection-[place] 10/22/16  TPI 10/28/16, 01/2017, 07/14/17, 08/11/17, 10/27/17, 03/22/18, 12/17/19  Lumbar L2, L3, L4 MBB - >80% relief x2 in October 2022  Lumbar L2, L3, L4 RFA - 12/10/20 - initial flare-likely neuritis. Can assess efficacy longer term.  PT-07/2018  Bilateral knee IA CSI-Sports med 02/16/19  Trigger point injections 03/05/20 and 04/21/20, 05/30/20, 07/09/20, 08/21/20, 09/25/20, 11/05/20  Caudal ESI 03/19/20, 08/11/20, 09/01/21  TENS unit-ordered 05/2020  12/24/21-bilateral GTB CST ortho  11/26/21-ortho knee CSI  11/19/21-ortho knee CSI  Cervical MBB-TBD    Imaging:  Cervical XRAY 11/20/21  Impression   Mild multilevel degenerative disc disease, greatest at C4-C5 and C5-6 and unchanged from the prior CT on 08/08/2020. Anterolisthesis of C4 on C5 is also unchanged.       Review Of Systems  General {ajlrosgen:46920}  Cardiovascular {aggROSCV:37303}  Gastrointestinal {aggROSGI:37304}  Skin {aggROSskin:37305}  Endocrine {aggROSendo:37306}  Musculoskeletal {aggROSmusk:37307}  Neurologic {aggROSneu:37308}  Psychiatric {aggROSpsych:37309}  Physical Exam    VITALS:   There were no vitals filed for this visit.      Wt Readings from Last 3 Encounters:   08/12/22 73.5 kg (162 lb)   08/11/22 73.8 kg (162 lb 11.2 oz)   08/10/22 73.5 kg (162 lb)     GENERAL: Well developed, well-nourished {loboncobese:42394} and is in {BLANK EXBMWU:13244} distress. The patient is pleasant and interactive. Patient is a good historian.  No evidence of sedation or intoxication.    HEENT: Normocephalic/atraumatic.   CARDIOVASCULAR:  {ajlcardiacexam:61906}  RESPIRATORY:  {ajlpulmexam:61907::Normal work of breathing, no supplemental 02}  EXTREMITIES: No clubbing, cyanosis noted.  NEUROLOGIC:  Alert and oriented, speech fluent, normal language. Cranial nerves grossly intact.  Sensation {ajlneuroexam:62877}. Reflexes were {ajlreflexes:69631} and {were/were not:54765} equal in upper and lower extremities  MUSCULOSKELETAL:  Motor function  {Pain Motor [phone]::5/5} in {ajlupperlower:48905::upper,lower} extremities.  Patient rises from a seated position with {ajlambulate3:46823::no} difficulty.  The patient was able to ambulate with {ajlambulate3:46823::no} difficulty throughout the [name] today {With/without:5700} the assistance of a walking aid-{ajlcane:46931::None}.   Paraspinal tenderness to palpation {present/not present:25705} in the {Cervical Thoracic Lumbar:47568} spine.   Muscles {WERE / WERE NOT:19253} in spasm/tight.  SKIN: No obvious rashes, lesions, or erythema.  PSY: Appropriate, full range affect, no psychomotor retardation

## 2022-08-18 NOTE — Unmapped (Signed)
Tried sending in a new prescription with request to the pharmacist about proair being covered tier 2 inhaler for her insurance, and to dispense it to her

## 2022-08-19 ENCOUNTER — Ambulatory Visit: Admit: 2022-08-19 | Payer: MEDICARE | Attending: Anesthesiology | Primary: Anesthesiology

## 2022-08-20 ENCOUNTER — Telehealth: Admit: 2022-08-20 | Discharge: 2022-08-21 | Payer: MEDICARE | Attending: Psychologist | Primary: Psychologist

## 2022-08-20 DIAGNOSIS — F4321 Adjustment disorder with depressed mood: Principal | ICD-10-CM

## 2022-08-20 DIAGNOSIS — F1190 Opioid use, unspecified, uncomplicated: Principal | ICD-10-CM

## 2022-08-20 DIAGNOSIS — F419 Anxiety disorder, unspecified: Principal | ICD-10-CM

## 2022-08-20 NOTE — Unmapped (Signed)
[facility]   Confidential Psychological Therapy Session    Patient Name: [name]  Medical Record Number: [id number]  Date of Service: August 20, 2022  Attending Psychologist: Caroline More, PhD  CPT Procedure Code: 81191 for 60 minutes of face to face counseling    This visit was performed face to face with interactive technology using a HIPPA compliant audio/visual platform. We reviewed confidentiality today. The patient was present in [place], a state in which this provider is licensed and able to provide care (location and contact information confirmed), attended this visit alone, and consented to this virtual pain psychology visit.    REFERRING PHYSICIAN: Criss Rosales, MD    CHIEF COMPLAINT AND REASON FOR VISIT:  COM follow up evaluation/pain coping skills; CBT/ACT for pain; grief    SUBJECTIVE / HISTORY OF PRESENT ILLNESS: [name] is a very pleasant 52 y.o.  female with chronic lower back pain, chronic headaches, and myofascial pain. Her pain started in 1999 in her lower back.  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  She had a spinal fusion in 2001 at the L5-S1 level.  She has had bilateral shoulder pain, neck pain, lower back pain, and b/l knees.  She also described a radiculopathy b/l that goes from her back to her bilateral buttocks and down to her toes.      Pt initially established with Dr. Oda Kilts in 08/2016 and then was was first evaluated by me in 07/2017. Last follow up with me was 07/20/2022. Patient participates actively.  Processes psychosocial stressors - negative feedback from one member of her class, custody of niece, lawsuit that was settled recently is now expunged, son's MH (asks for MH referral info, he has no insurance), needs to move//lease violation.    Patient reports good adherence to pain contract. Anticipates may need prior auth given switching back to [facility]. Reviewed process for calling our nursing staff if/whn PA is needed.    Spent extra time reviewing pain and pain coping. Doing better overall and looking forward to TPI next month. Considering acupuncture (wants to find a location that takes her insurance). Practiced mindful breathing and body scan and reviewed problem solving for psychosocial stressors.      OBJECTIVE / MENTAL STATUS:    Appearance:   Appears stated age and Clean/Neat   Motor:  No abnormal movements   Speech/Language:   Normal rate, volume, tone, fluency   Mood:  Irritable   Affect:  Blunted   Thought process:  Logical, linear, clear, coherent, goal directed   Thought content:    Denies SI, HI, self harm, delusions, obsessions, paranoid ideation, or ideas of reference   Perceptual disturbances:    Denies auditory and visual hallucinations, behavior not concerning for response to internal stimuli   Orientation:  Oriented to person, place, time, and general circumstances   Attention:  Able to fully attend without fluctuations in consciousness   Concentration:  Able to fully concentrate and attend   Memory:  Immediate, short-term, long-term, and recall grossly intact    Fund of knowledge:   Consistent with level of education and development   Insight:    Fair   Judgment:   Intact   Impulse Control:  Intact       DIAGNOSTIC IMPRESSION:   Anxiety NOS  Chronic continuou use of opiates  Grief    ASSESSMENT:   [name] is a very pleasant 52  y.o.  female from [place], [place] with chronic lower back pain, chronic headaches, and myofascial pain. Her pain started in 1999 in her lower back.  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  She had a spinal fusion in 2001 at the L5-S1 level.  She has had bilateral shoulder pain, neck pain, lower back pain, and b/l knees.  She also described a radiculopathy b/l that goes from her back to her bilateral buttocks and down to her toes.  The patient is currently considered to be high risk due to prior nonadherence, but appropriate with a behavioral adherence plan in place.  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 She continues to participate actively in psychotherapy.    PLAN:   (1) COM - high risk but remains appropriate with a behavioral adherence plan also in place.    -Patient MUST meet with pain psychology/me once a month.   -No additional overuse of opioids will be tolerated.    -Patient should always bring her medication to clinic for pill counts.  -Patient was informed she has had numerous infractions with respect to her pain contract over the years, and that no further infractions will be tolerated.    If any additional pain contract or behavioral adherence contract infractions occur, I recommend stopping opioids.    Pt doing well with adherence, per our last few visits.     (2) Pain coping skills- addressed compliance, as well as pacing and mindful breathing, body scan, and problem solving    (3) follow-in 1 month    (4) MH resources provided for her son, per her request    [facility]  Phone: [phone]  Fax: [phone]  Crisis Line: [phone]

## 2022-08-24 NOTE — Unmapped (Addendum)
Name:  Natalie [name]  DOB: Oct 31, 1970  Today's Date: 08/25/2022  Age:  52 y.o.    Assessment/Plan:      Natalie [name] was seen today for follow-up.    Diagnoses and all orders for this visit:    Sore throat  -     POCT Rapid Strep A NAA; Future  -     POCT Rapid Influenza A/B, RSV, SARS-CoV2 NAA; Future    Urinary urgency  -     Urinalysis with Microscopy; Future  -     Urinalysis with Microscopy    Acute cough  -     POCT Rapid Influenza A/B, RSV, SARS-CoV2 NAA; Future      Her nose is quite congested.  Her sore throat could be due to post nasal drip. The pharynx is clear, but will confirm she does not have Strep Throat, or other upper respiratory infection.  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    Apparently, she has not been using her nasal steroid spray and azelastine spray for at least 2 weeks.  She is encouraged to restart using them regularly, considering her history of recurrent sinus infections.  Salt and warm water gargling as well.    Will also obtain UA, as she reports urinary urgency and passing small amounts of urine at a time.    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    Diagnosis and plan along with any newly prescribed medication(s) were discussed in detail with this patient today. The patient verbalized understanding and agreed with the plan without language barriers or behavioral barriers to understanding unless otherwise noted.    Subjective:      HPI: Natalie [name] is a 52 y.o. female is here for    Chief Complaint   Patient presents with    Follow-up     Soar throat     She is here due to sore throat after being caught in the rain. Symptoms x 3 days, including discomfort when swallowing, nasal congestion/sinus pressure, ear fullness intermittent dry cough, and hoarseness.  Admits to occasional post nasal drip.  Currently, she has not been taking Xhance and azelastine nose sprays.    Denies rhinorrhea, difficulty breathing, shortness of breath.  Says her niece was sick but she was not around her for prolonged time.  No diarrhea or vomiting, loss of taste or smell.  No dysuria, but passes small amount of urine. No malodor, or frequency. Admits to urgency.    She also complains of having headache (10/10) causing increased sensitivity to light. Exacerbated/triggered with neck pains.  Is still followed by Neuro' for headaches.    Has not taken pain medicines today as yet.    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    Past Medical/Surgical History:     Past Medical History:   Diagnosis Date    Allergic conjunctivitis 11/23/2018    Anemia 1986    Arthritis     Breast mass     Chondromalacia of left knee     Chondromalacia of right knee     Depressive disorder     Fibrocystic breast     GERD (gastroesophageal reflux disease)     Hematuria     History of kidney stones     History of transfusion     Hyperlipidemia 1999  Hypothyroidism     Lumbar disc disease 1998    She was injured at work at the age of 55 and then had disk surgery 2 years later     Menopause ovarian failure     Menopause, premature     Migraine with aura     Neuromuscular disorder (CMS-HCC) 1999    Obesity (BMI 30-39.9) 11/23/2018    Ovarian cyst     Plantar fasciitis     Pseudotumor cerebri     Rash due to allergy 11/23/2018    Sickle cell trait (CMS-HCC)     Urinary tract infection     Vaginitis      Past Surgical History:   Procedure Laterality Date    BACK SURGERY  2001    BREAST BIOPSY Bilateral     When patient was 16 & 17 Both Negative    HYSTERECTOMY  2008    PR REMOVAL OF TONSILS,13+ Y/O Bilateral 10/10/2019    Procedure: TONSILLECTOMY;  Surgeon: Lona Millard, MD;  Location: OR [place];  Service: ENT    SALPINGOOPHORECTOMY Bilateral 2007    SPINE SURGERY  2001    TOTAL VAGINAL HYSTERECTOMY  01/04/2006    Uterine Prolapse-Dr. Leeanne Rio OP Note    TUBAL LIGATION  1995 Family History:     Family History   Problem Relation Age of Onset    Hypertension Mother     Heart disease Mother     Arthritis Mother     Depression Mother     Drug abuse Mother     Mental illness Mother     Ulcers Mother     COPD Mother     Heart attack Father     Mental illness Father     Asthma Father     Cancer Sister         lymphoma    Heart attack Maternal Grandmother     Heart disease Maternal Grandmother     Hypertension Maternal Grandmother     Thyroid disease Maternal Grandmother     Stroke Paternal Grandfather     Depression Son     Drug abuse Son     Mental illness Son     COPD Maternal Aunt     Heart attack Cousin 16    Breast cancer Cousin 32    Thyroid disease Other     Alcohol abuse Neg Hx        Social History:     Social History     Socioeconomic History    Marital status: Single     Spouse name: None    Number of children: 2    Years of education: 15    Highest education level: None   Occupational History     Employer: NOT EMPLOYED   Tobacco Use    Smoking status: Never     Passive exposure: Past    Smokeless tobacco: Never   Vaping Use    Vaping status: Never Used   Substance and Sexual Activity    Alcohol use: Never    Drug use: Never    Sexual activity: Yes     Partners: Male     Birth control/protection: Post-menopausal, Surgical     Comment: steady partner for 4 yrs, as on 04/03/19.   Social History Narrative    Marital Status - Single     Children - Son(s) [2]    Pets - Dog (1) Turtle (1)     Household - Lives with  sons and grandson Ephriam Knuckles)     Occupation - Disabled since 2002    Tobacco/Alcohol/Drug Use - Denies      Diet - Regular    Exercise/Sports - Walking     Hobbies - Movies     Education - [employer] (Engineer, agricultural)     Country of Origin - [place]                      Social Determinants of Health     Financial Resource Strain: Low Risk  (08/25/2022)    Overall Financial Resource Strain (CARDIA)     Difficulty of Paying Living Expenses: Not hard at all   Recent Concern: Financial Resource Strain - Medium Risk (06/16/2022)    Overall Financial Resource Strain (CARDIA)     Difficulty of Paying Living Expenses: Somewhat hard   Food Insecurity: No Food Insecurity (08/25/2022)    Hunger Vital Sign     Worried About Running Out of Food in the Last Year: Never true     Ran Out of Food in the Last Year: Never true   Transportation Needs: No Transportation Needs (08/25/2022)    PRAPARE - Transportation     Lack of Transportation (Medical): No     Lack of Transportation (Non-Medical): No   Stress: Stress Concern Present (06/16/2022)    [employer] of Occupational Health - Occupational Stress Questionnaire     Feeling of Stress : To some extent       Allergies:     Buprenorphine hcl, Pregabalin, Buprenorphine, Cephalexin monohydrate, Doxycycline, Oxycodone hcl, Adhesive tape-silicones, Doxycycline hyclate (bulk), Keflex [cephalexin], Opioids - morphine analogues, and Oxycodone-acetaminophen    Current Medications:     Current Outpatient Medications   Medication Sig Dispense Refill    albuterol HFA 90 mcg/actuation inhaler Inhale 2 puffs every six (6) hours as needed for wheezing. 54 g 0    albuterol HFA 90 mcg/actuation inhaler Inhale 2 puffs every six (6) hours as needed for wheezing or shortness of breath. 54 g 0    azelastine (ASTELIN) 137 mcg (0.1 %) nasal spray 2 sprays into each nostril two (2) times a day. 30 mL 0    azelastine (OPTIVAR) 0.05 % ophthalmic solution Administer 2 drops to both eyes daily as needed. 6 mL 5    baclofen (LIORESAL) 20 MG tablet 10 - 20 mg three times a day as needed for spasms. 90 tablet 2    cetirizine (ZYRTEC) 10 MG chewable tablet Chew 1 tablet (10 mg total) at bedtime as needed for allergies.      diclofenac sodium (VOLTAREN) 1 % gel Apply 2 g topically four (4) times a day. 100 g 6    empty container Misc Use as directed to dispose of Xolair syringes 1 each 2    EPINEPHrine (EPIPEN) 0.3 mg/0.3 mL injection Inject 0.3 mL (0.3 mg total) into the muscle once as needed for anaphylaxis (Difficulty breathing, throat closing, etc) for up to 1 dose. 1 each 12    estradiol (ESTRACE) 0.5 MG tablet Take 1 tablet (0.5 mg total) by mouth daily. 30 tablet 1    famotidine (PEPCID) 20 MG tablet Take 1 tablet (20 mg total) by mouth two (2) times a day.      fluconazole (DIFLUCAN) 150 MG tablet Take one tablet once. May repeat after 3 days as needed. 3 tablet 3    gabapentin (NEURONTIN) 800 MG tablet Take 1 tablet (800 mg total) by mouth two (  2) times a day. 180 tablet 0    hydroCHLOROthiazide 12.5 MG tablet TAKE 1 TABLET(12.5 MG) BY MOUTH DAILY AS NEEDED FOR SWELLING 90 tablet 1    methylPREDNISolone (MEDROL DOSEPACK) 4 mg tablet Take 1 tablet (4 mg total) by mouth daily. follow package directions 1 each 0    nortriptyline (PAMELOR) 25 MG capsule Take 1 capsule (25 mg total) by mouth nightly. 90 capsule 1    omalizumab (XOLAIR) 150 mg/mL syringe Inject 2 mL (300 mg total) under the skin every fourteen (14) days. 2 mL 11    omalizumab (XOLAIR) 75 mg/0.5 mL syringe Inject under the skin.      oxyCODONE-acetaminophen (PERCOCET) 10-325 mg per tablet Take 1 tablet by mouth every four (4) hours as needed for pain. Max 4 tabs/day. Fill on or after: 08/05/22. Brand name only due to allergy. 120 tablet 0    pantoprazole (PROTONIX) 40 MG tablet TAKE 1 TABLET(40 MG) BY MOUTH DAILY 30 MINUTES BEFORE BREAKFAST AS NEEDED 30 tablet 5    potassium chloride 20 MEQ ER tablet Take 1 tablet (20 mEq total) by mouth daily. 90 tablet 3    PROCHAMBER Spcr       rosuvastatin (CRESTOR) 10 MG tablet Take 1 tablet (10 mg total) by mouth nightly. 90 tablet 3    triamcinolone (KENALOG) 0.1 % cream Apply 1 application topically two (2) times a day as needed WHEN ALLERGIES FLARE-UP AND CAUSES RASH / ITCHING 80 g 0    ubrogepant (UBRELVY) 50 mg tablet Take 50 mg by mouth for headache. May repeat in 2 hours, if needed. LIMIT 2 PILLS PER DAY AND NO MORE THAN 4 PILLS PER WEEK. 16 tablet 3    XHANCE 93 mcg/actuation AerB 1 spray into each nostril two (2) times a day as needed. 16 mL 11    albuterol HFA 90 mcg/actuation inhaler Inhale 2 puffs every four (4) hours as needed for wheezing. 54 g 0     Current Facility-Administered Medications   Medication Dose Route Frequency Provider Last Rate Last Admin    omalizumab (XOLAIR) injection 300 mg  300 mg Subcutaneous Q28 Days Volertas, Sofija Dalia, MD   300 mg at 03/09/22 1441       ROS:     All ROS negative unless otherwise noted in HPI.    Vital Signs:     Wt Readings from Last 3 Encounters:   08/25/22 72.2 kg (159 lb 3.2 oz)   08/12/22 73.5 kg (162 lb)   08/11/22 73.8 kg (162 lb 11.2 oz)     Temp Readings from Last 3 Encounters:   08/25/22 36.7 ??C (98 ??F) (Temporal)   06/08/22 36.3 ??C (97.4 ??F) (Temporal)   05/11/22 36.6 ??C (97.9 ??F) (Skin)     BP Readings from Last 3 Encounters:   08/25/22 104/60   08/12/22 121/81   08/11/22 115/74     Pulse Readings from Last 3 Encounters:   08/25/22 80   08/12/22 69   08/11/22 91     Body mass index is 34.45 kg/m??.    Objective:      Physical Exam  Vitals and nursing note reviewed.   Constitutional:       General: She is not in acute distress.     Appearance: Normal appearance. She is well-developed. She is not ill-appearing or diaphoretic.   HENT:      Right Ear: Tympanic membrane normal.      Left Ear: Tympanic membrane normal.  Nose: Congestion present.      Right Sinus: Frontal sinus tenderness present.      Left Sinus: Frontal sinus tenderness present.      Comments: 2+ enlarged nasal hypertrophy, pale and boggy.     Mouth/Throat:      Pharynx: Oropharynx is clear. No oropharyngeal exudate or posterior oropharyngeal erythema.      Tonsils: No tonsillar abscesses.   Eyes:      General: No scleral icterus.     Conjunctiva/sclera: Conjunctivae normal.   Neck:      Thyroid: No thyroid mass, thyromegaly or thyroid tenderness.      Vascular: No carotid bruit or JVD.      Comments: No thyromegaly, no nodules or masses felt on exam.  Cardiovascular:      Rate and Rhythm: Normal rate and regular rhythm.      Heart sounds: Normal heart sounds. No murmur heard.  Pulmonary:      Effort: Pulmonary effort is normal. No respiratory distress.      Breath sounds: Normal breath sounds. No wheezing.   Abdominal:      Palpations: Abdomen is soft.      Tenderness: There is no abdominal tenderness. There is no right CVA tenderness or left CVA tenderness.   Musculoskeletal:      Cervical back: Tenderness present.      Right lower leg: No edema.      Left lower leg: No edema.   Lymphadenopathy:      Cervical: No cervical adenopathy.   Neurological:      Mental Status: She is alert and oriented to person, place, and time.   Psychiatric:         Mood and Affect: Mood normal.         Behavior: Behavior normal.         Thought Content: Thought content normal.         Judgment: Judgment normal.           Labs:     No results found for this visit on 08/25/22.    I attest that I, Rayetta Humphrey, personally documented this note while acting as scribe for Royal Hawthorn, MD.      Rayetta Humphrey, Scribe.  08/25/2022     The documentation recorded by the scribe accurately reflects the service I personally performed and the decisions made by me.    Royal Hawthorn, MD

## 2022-08-25 ENCOUNTER — Ambulatory Visit: Admit: 2022-08-25 | Discharge: 2022-08-26 | Payer: MEDICARE

## 2022-08-25 DIAGNOSIS — R051 Acute cough: Principal | ICD-10-CM

## 2022-08-25 DIAGNOSIS — R3915 Urgency of urination: Principal | ICD-10-CM

## 2022-08-25 DIAGNOSIS — J029 Acute pharyngitis, unspecified: Principal | ICD-10-CM

## 2022-08-25 LAB — URINALYSIS WITH MICROSCOPY
BILIRUBIN UA: NEGATIVE
GLUCOSE UA: NEGATIVE
HYPHAL YEAST: NONE SEEN /HPF
KETONES UA: NEGATIVE
LEUKOCYTE ESTERASE UA: NEGATIVE
NITRITE UA: NEGATIVE
PH UA: 7 (ref 5.0–9.0)
PROTEIN UA: NEGATIVE
RBC UA: 4 /HPF — ABNORMAL HIGH (ref 0–3)
SPECIFIC GRAVITY UA: 1.007 (ref 1.005–1.030)
SQUAMOUS EPITHELIAL: 3 /HPF (ref 0–5)
UROBILINOGEN UA: <2
WBC CLUMPS: NONE SEEN /HPF
WBC UA: 1 /HPF (ref 0–2)
YEAST: NONE SEEN /HPF

## 2022-08-26 DIAGNOSIS — R3915 Urgency of urination: Principal | ICD-10-CM

## 2022-08-26 DIAGNOSIS — R3129 Other microscopic hematuria: Principal | ICD-10-CM

## 2022-08-27 DIAGNOSIS — J0100 Acute maxillary sinusitis, unspecified: Principal | ICD-10-CM

## 2022-08-27 MED ORDER — AMOXICILLIN 500 MG CAPSULE
ORAL_CAPSULE | Freq: Three times a day (TID) | ORAL | 0 refills | 10 days | Status: CP
Start: 2022-08-27 — End: 2022-09-06

## 2022-08-27 NOTE — Unmapped (Signed)
Pre-procedure call made and RN spoke with patient.    Denies recent infection/antibiotics.   Denies being diabetic.  Denies taking any recent blood thinners/NSAIDs.   Denies any electronic implants  Denies pregnancy        Patient informed to arrive 30 minutes before procedure appointment time.  Patient verbalized understanding to all.

## 2022-08-31 ENCOUNTER — Ambulatory Visit: Admit: 2022-08-31 | Discharge: 2022-09-01 | Payer: MEDICARE | Attending: Anesthesiology | Primary: Anesthesiology

## 2022-08-31 DIAGNOSIS — M7918 Myalgia, other site: Principal | ICD-10-CM

## 2022-08-31 DIAGNOSIS — M170 Bilateral primary osteoarthritis of knee: Principal | ICD-10-CM

## 2022-08-31 DIAGNOSIS — M47812 Spondylosis without myelopathy or radiculopathy, cervical region: Principal | ICD-10-CM

## 2022-08-31 DIAGNOSIS — M47816 Spondylosis without myelopathy or radiculopathy, lumbar region: Principal | ICD-10-CM

## 2022-08-31 DIAGNOSIS — M791 Myalgia, unspecified site: Principal | ICD-10-CM

## 2022-08-31 NOTE — Unmapped (Addendum)
Trigger Point Injection of 3+ muscle groups: Bilateral cervical paraspinals, bilateral trapezius, rhomboids, and levator scapulae  Pre-operative diagnosis: myalgia  Post-operative diagnosis: Same    Attending Physician: Dr. Criss Rosales  Assistant: Dr. Susy Manor     The patient is a 52 y.o. female with past medical history of pseudotumor cerebri, bilateral knee osteoarthritis, myofascial pain syndrome, L5-S1 spinal fusion in 2001, coccydynia. She presents today for treatment of her myofascial paracervical, shoulders, upper mid and lower back areas with repeat trigger point injections     Last TPIs were completed on 4/8 and overall doing well and gets 85% relief for 6-8 weeks with each session. On the functional scale she scored a 2 but this is in the midst of your treatment plan, we do not have a pre- TPI assessment scale but we anticipate that without the injections she would be functionally impaired. She completed cervical RFAs in April as well with some improvement.    Reports significant benefit from continuing on a regular TPI regimen. 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     Has serial consent from 03/11/2022     Repeat TPI must document:     - Pt with focal area of pain in skeletal muscle: neck, upper back   - Exam reveals hyperirritable spot, taut band identified by palpation, and nodule of muscle fiber harder than normal consistency   - Pt has failed non-invasive conservative therapy  - Pt involved in ongoing conservative treatment program including HEP  - Pt with previous TPI on 05/03/22 with pain relief of 8/10 to 2/10 lasting for 7 weeks  - Pt noted functional improvement after last TPI of >50%  - Now with recurrence of myofascial pain resulting functional limitations including: household chores and cooking, standing sink   - Dates of previous TPIs in 12 month rolling period:  05/03/22 (no more than 3)  -FMAS score prior to TPI: is not available since the change in insurance requirements have changed in the middle of her treatment plan and she has received several trigger points in the past.   -FMAS score following TPI: 2. Interpretation: 0-10: Minimal impact on function       After risks and benefits were explained including bleeding, infection, worsening of the pain, damage to the area being injected, weakness, allergic reaction to medications, vascular injection, pneumothorax and nerve damage, signed consent was obtained.  All questions were answered.   The patient is not taking antiplatelet or anticoagulation medications and does not have a driver today.    The area of the trigger point was identified and the skin prepped with chlorhexidine.  Next, a 27 gauge 0.5 inch needle was placed in the area of the trigger point.  Dry needling was performed in the area of the trigger point.  Once reproduction of the pain was elicited and negative aspiration confirmed, the trigger point was injected with 0.5-84ml of .25% bupivacaine and the needle removed.      The patient did tolerate the procedure well and there were not complications.      Trigger points injected:  20       Trigger point locations:  Bilateral cervical paraspinals, bilateral trapezius, rhomboids, and levator scapulae    The patient was monitored for 15 minutes after the procedure.  Vital signs remained normal and the patient ambulated out of clinic    Pre-procedure pain score: 7/10  Post-procedure pain score: 2/10    DISPO:  Has appt 8/22.  Due  to some issues getting back to clinic, this is slightly delayed.  However, we have seen her in the interim for procedure visits.  She last filled percocet on 7/11.  PDMP reviewed.  One additional Rx sent so she can make it to the clinic visit.    Requested Prescriptions     Signed Prescriptions Disp Refills    oxyCODONE-acetaminophen (PERCOCET) 10-325 mg per tablet 120 tablet 0     Sig: Take 1 tablet by mouth every four (4) hours as needed for pain. Max 4 tabs/day. Fill on or after: 09/04/22. Brand name only due to allergy.     No orders of the defined types were placed in this encounter.        Answers submitted by the patient for this visit:  Back Pain Questionnaire (Submitted on 08/24/2022)  Chief Complaint: Back pain  Chronicity: chronic  Onset: more than 1 year ago  Frequency: constantly  Progression since onset: unchanged  Pain location: gluteal, lumbar spine, sacro-iliac, thoracic spine  Pain quality: aching, burning, cramping, shooting, stabbing  Radiates to: left foot, left knee, left thigh, right foot, right knee, right thigh  Pain - numeric: 9/10  Pain is: the same all the time  Aggravated by: coughing, position, lying down, sitting, standing, twisting  Stiffness is present: all day  abdominal pain: No  bladder incontinence: No  bowel incontinence: No  chest pain: No  fever: No  headaches: Yes  leg pain: Yes  numbness: Yes  perianal numbness: No  dysuria: No  paresis: No  pelvic pain: No  paresthesias: Yes  tingling: Yes  weakness: No  weight loss: No      I saw and evaluated the patient with the fellow.  I was scrubbed and participated with the entire procedure.  Criss Rosales, MD

## 2022-08-31 NOTE — Unmapped (Signed)
AVS reviewed with patient and she verbalized understanding to all.

## 2022-08-31 NOTE — Unmapped (Signed)
POST PROCEDURE INSTRUCTIONS   You may apply an ice pack 20-30 minutes at a time to the injection site if you experience soreness.     Keep the injection site clean and dry. You make remove the band-aid one day following the procedure.     You may take a shower but AVOID getting in to baths, pools or whirlpools for 48 HOURS AFTER THE PROCEDURE.       ACTIVITY   Refrain from heavy activity for the next 24 to 48 hours. General walking is okay. You may resume your normal activities the day following the procedure.   You may start or resume your individualized exercise program or physical therapy 48 hours after the procedure.  IF YOU'VE HAD TRIGGER POINT INJECTIONS, YOU MAY CONTINUE EXERCISE OR PHYSICAL THERAPY WITHOUT DELAY.  MEDICATIONS   Please note that it is okay to continue other prescribed medications (blood pressure, insulin, water pill, depression/anxiety pill, etc.) as well as other prescribed pain medications such as Neurontin, Lyrical, Celebrex, Ultram, Vicodin, Norco and acetaminophen (Tylenol).   SIDE EFFECTS   Increase in pain during the first 24 to 48 hours.     You might experience:   1. Mild to moderate swelling at the joint.   2. Possible bruising at the injection site.     WHEN TO CALL THE DOCTOR/NURSE   Severe pain, worse or different that the pain you had before the procedure.     Fever or chills.     Redness, or swelling around the injection site.     Call the Pain Management  Procedural nurses [phone],  during normal business hours (7 am-3 pm). If it is AFTER HOURS or during a weekend or holiday, call the hospital operator and ask for the Anesthesia Pain physician on call at [phone].   FOR EMERGENCIES, CALL 911 OR GO TO THE NEAREST HOSPITAL EMERGENCY DEPARTMENT.   ?   TO SCHEDULE APPOINTMENTS OR FOR QUESTIONS RELATED TO MEDICATIONS   Call the Pain Management Clinic at [phone]

## 2022-08-31 NOTE — Unmapped (Signed)
Addended by: Jilda Roche A on: 08/31/2022 09:54 AM     Modules accepted: Orders

## 2022-09-01 NOTE — Unmapped (Signed)
Addended by: Herbie Saxon on: 09/01/2022 08:57 AM     Modules accepted: Orders

## 2022-09-02 NOTE — Unmapped (Addendum)
[facility] Refill Coordination Note    Natalie [name], [place]: January 20, 1971  Phone: There are no phone numbers on file.      All above HIPAA information was verified with patient.         09/01/2022     9:34 AM   Specialty Rx Medication Refill Questionnaire   Which Medications would you like refilled and shipped? Xolair   Please list all current allergies: Grass pollen veet   Have you missed any doses in the last 30 days? No   Have you had any changes to your medication(s) since your last refill? No   How many days remaining of each medication do you have at home? 0   If receiving an injectable medication, next injection date is 09/11/2022   Have you experienced any side effects in the last 30 days? No   Please enter the full address (street address, city, state, zip code) where you would like your medication(s) to be delivered to. [address] [name] [address]   Please specify on which day you would like your medication(s) to arrive. Note: if you need your medication(s) within 3 days, please call the pharmacy to schedule your order at [phone]  09/04/2022   Has your insurance changed since your last refill? No   Would you like a pharmacist to call you to discuss your medication(s)? No   Do you require a signature for your package? (Note: if we are billing Medicare Part B or your order contains a controlled substance, we will require a signature) No   Additional Comments: Drop off at front door         Completed refill call assessment today to schedule patient's medication shipment from the [facility] [phone]).  All relevant notes have been reviewed.       Confirmed patient received a Conservation officer, historic buildings and a Surveyor, mining with first shipment. The patient will receive a drug information handout for each medication shipped and additional FDA Medication Guides as required.         REFERRAL TO PHARMACIST     Referral to the pharmacist: Not needed      [facility]     Shipping address confirmed in Epic.     Delivery Scheduled: Yes, Expected medication delivery date: 09/06/2022.     Medication will be delivered via Same Day Courier to the prescription address in Epic WAM.    [name]   [facility] Specialty Technician

## 2022-09-04 MED ORDER — OXYCODONE-ACETAMINOPHEN 10 MG-325 MG TABLET
ORAL | 0 refills | 20 days | Status: CP | PRN
Start: 2022-09-04 — End: ?

## 2022-09-06 MED FILL — XOLAIR 150 MG/ML SUBCUTANEOUS SYRINGE: SUBCUTANEOUS | 28 days supply | Qty: 4 | Fill #3

## 2022-09-06 NOTE — Unmapped (Signed)
hell 

## 2022-09-14 ENCOUNTER — Telehealth: Admit: 2022-09-14 | Discharge: 2022-09-15 | Payer: MEDICARE

## 2022-09-14 DIAGNOSIS — H1013 Acute atopic conjunctivitis, bilateral: Principal | ICD-10-CM

## 2022-09-14 DIAGNOSIS — N951 Menopausal and female climacteric states: Principal | ICD-10-CM

## 2022-09-14 MED ORDER — ESCITALOPRAM 10 MG TABLET
ORAL_TABLET | 1 refills | 0 days | Status: CP
Start: 2022-09-14 — End: ?

## 2022-09-14 MED ORDER — AZELASTINE 0.05 % EYE DROPS
Freq: Every day | OPHTHALMIC | 5 refills | 60 days | Status: CP | PRN
Start: 2022-09-14 — End: ?

## 2022-09-14 NOTE — Unmapped (Cosign Needed)
Patient requesting refill of Azelistine 0.05% ophthalmic solution.

## 2022-09-14 NOTE — Unmapped (Signed)
The patient reports they are physically located in [place] and is currently: at home. I conducted a audio/video visit. I spent  39m 36s on the video call with the patient. I spent an additional 5 minutes on pre- and post-visit activities on the date of service .       Name:  Natalie [name]  DOB: 09-Oct-1970  Today's Date: 09/14/2022  Age:  52 y.o.    Assessment/Plan:      Diagnoses and all orders for this visit:    Hot flashes due to menopause  -     escitalopram oxalate (LEXAPRO) 10 MG tablet; 1/2 tab PO Daily for 6 days. If well tolerated, increase to 1 whole tablet PO daily.    Allergic conjunctivitis of both eyes  -     azelastine (OPTIVAR) 0.05 % ophthalmic solution; Administer 2 drops to both eyes daily as needed.      We will start her on Lexapro half tablet of 10 mg. She may take it in the evenings, if necessary, as it may cause some drowsiness. If well tolerated after 6 days, she may take whole tablet.    Cautioned about nausea or vomiting as common side effect of Lexapro.  Also informed her about less common effect: increased risk of suicidal thoughts and severe depression, most prominent in teenagers/young adults. She is encouraged to seek help in such case.    She was referred to [facility] gynecology at [facility] back in June 2024, but it looks like she has not scheduled an appointment yet. She is provided with their contact information via MyChart to establish appointment.    Azelastine eyedrops have been helping her allergic conjunctivitis.  Refill provided.    Diagnosis and plan along with any newly prescribed medication(s) were discussed in detail with this patient today. The patient verbalized understanding and agreed with the plan without language barriers or behavioral barriers to understanding unless otherwise noted.    Subjective:      HPI: Natalie [name] is a 52 y.o. female is here for   No chief complaint on file.    She is wanting to discuss medications that will reduce her hot flashes, prominent in he daytime and nighttime. Associated with excess sweating.  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  Denies anxiety/depressive feelings.    She has been on Estrace tablet for vaginal dryness/menopausal symptoms.  She was referred to GYN at [facility] gynecology in [facility], but has not scheduled an appointment as of yet.    Asthma has been well-controlled.  She uses albuterol as needed.  Azelastine eyedrops have been helping conjunctivitis.  She requests refill.  Past Medical/Surgical History:     Past Medical History:   Diagnosis Date    Allergic conjunctivitis 11/23/2018    Anemia 1986    Arthritis     Breast mass     Chondromalacia of left knee     Chondromalacia of right knee     Depressive disorder     Fibrocystic breast     GERD (gastroesophageal reflux disease)     Hematuria     History of kidney stones     History of transfusion     Hyperlipidemia 1999    Hypothyroidism     Lumbar disc disease 1998    She was injured at work at the age of 28 and then had disk surgery 2 years  later     Menopause ovarian failure     Menopause, premature     Migraine with aura     Neuromuscular disorder (CMS-HCC) 1999    Obesity (BMI 30-39.9) 11/23/2018    Ovarian cyst     Plantar fasciitis     Pseudotumor cerebri     Rash due to allergy 11/23/2018    Sickle cell trait (CMS-HCC)     Urinary tract infection     Vaginitis      Past Surgical History:   Procedure Laterality Date    BACK SURGERY  2001    BREAST BIOPSY Bilateral     When patient was 16 & 17 Both Negative    HYSTERECTOMY  2008    PR REMOVAL OF TONSILS,13+ Y/O Bilateral 10/10/2019    Procedure: TONSILLECTOMY;  Surgeon: Lona Millard, MD;  Location: OR [facility];  Service: ENT    SALPINGOOPHORECTOMY Bilateral 2007    SPINE SURGERY  2001    TOTAL VAGINAL HYSTERECTOMY  01/04/2006    Uterine Prolapse-Dr. Leeanne Rio OP Note    TUBAL LIGATION  1995 Family History:     Family History   Problem Relation Age of Onset    Hypertension Mother     Heart disease Mother     Arthritis Mother     Depression Mother     Drug abuse Mother     Mental illness Mother     Ulcers Mother     COPD Mother     Heart attack Father     Mental illness Father     Asthma Father     Cancer Sister         lymphoma    Heart attack Maternal Grandmother     Heart disease Maternal Grandmother     Hypertension Maternal Grandmother     Thyroid disease Maternal Grandmother     Stroke Paternal Grandfather     Depression Son     Drug abuse Son     Mental illness Son     COPD Maternal Aunt     Heart attack Cousin 48    Breast cancer Cousin 32    Thyroid disease Other     Alcohol abuse Neg Hx        Social History:     Social History     Socioeconomic History    Marital status: Single     Spouse name: None    Number of children: 2    Years of education: 15    Highest education level: None   Occupational History     Employer: NOT EMPLOYED   Tobacco Use    Smoking status: Never     Passive exposure: Past    Smokeless tobacco: Never   Vaping Use    Vaping status: Never Used   Substance and Sexual Activity    Alcohol use: Never    Drug use: Never    Sexual activity: Yes     Partners: Male     Birth control/protection: Post-menopausal, Surgical     Comment: steady partner for 4 yrs, as on 04/03/19.   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, agricultural)     Country of Origin - [place]                      Social Determinants of Health     Financial Resource Strain: Patient Declined (09/14/2022)    Overall Financial Resource Strain (CARDIA)     Difficulty of Paying Living Expenses: Patient declined Recent Concern: Financial Resource Strain - Medium Risk (06/16/2022)    Overall Financial Resource Strain (CARDIA)     Difficulty of Paying Living Expenses: Somewhat hard   Food Insecurity: Patient Declined (09/14/2022)    Hunger Vital Sign     Worried About Running Out of Food in the Last Year: Patient declined     Ran Out of Food in the Last Year: Patient declined   Transportation Needs: Patient Declined (09/14/2022)    PRAPARE - Therapist, art (Medical): Patient declined     Lack of Transportation (Non-Medical): Patient declined   Stress: Stress Concern Present (06/16/2022)    [employer] of Occupational Health - Occupational Stress Questionnaire     Feeling of Stress : To some extent       Allergies:     Buprenorphine hcl, Pregabalin, Buprenorphine, Cephalexin monohydrate, Doxycycline, Oxycodone hcl, Adhesive tape-silicones, Doxycycline hyclate (bulk), Keflex [cephalexin], Opioids - morphine analogues, and Oxycodone-acetaminophen    Current Medications:     Current Outpatient Medications   Medication Sig Dispense Refill    albuterol HFA 90 mcg/actuation inhaler Inhale 2 puffs every six (6) hours as needed for wheezing. 54 g 0    albuterol HFA 90 mcg/actuation inhaler Inhale 2 puffs every six (6) hours as needed for wheezing or shortness of breath. 54 g 0    azelastine (ASTELIN) 137 mcg (0.1 %) nasal spray 2 sprays into each nostril two (2) times a day. 30 mL 0    baclofen (LIORESAL) 20 MG tablet 10 - 20 mg three times a day as needed for spasms. 90 tablet 2    cetirizine (ZYRTEC) 10 MG chewable tablet Chew 1 tablet (10 mg total) at bedtime as needed for allergies.      diclofenac sodium (VOLTAREN) 1 % gel Apply 2 g topically four (4) times a day. 100 g 6    EPINEPHrine (EPIPEN) 0.3 mg/0.3 mL injection Inject 0.3 mL (0.3 mg total) into the muscle once as needed for anaphylaxis (Difficulty breathing, throat closing, etc) for up to 1 dose. 1 each 12    estradiol (ESTRACE) 0.5 MG tablet Take 1 tablet (0.5 mg total) by mouth daily. 30 tablet 1    famotidine (PEPCID) 20 MG tablet Take 1 tablet (20 mg total) by mouth two (2) times a day.      gabapentin (NEURONTIN) 800 MG tablet Take 1 tablet (800 mg total) by mouth two (2) times a day. 180 tablet 0    hydroCHLOROthiazide 12.5 MG tablet TAKE 1 TABLET(12.5 MG) BY MOUTH DAILY AS NEEDED FOR SWELLING 90 tablet 1    omalizumab (XOLAIR) 150 mg/mL syringe Inject 2 mL (300 mg total) under the skin every fourteen (14) days. 2 mL 11    oxyCODONE-acetaminophen (PERCOCET) 10-325 mg per tablet Take 1 tablet by mouth every four (4) hours as needed for pain. Max 4 tabs/day. Fill on or after: 09/04/22. Brand name only due to allergy. 120 tablet 0    pantoprazole (PROTONIX)  40 MG tablet TAKE 1 TABLET(40 MG) BY MOUTH DAILY 30 MINUTES BEFORE BREAKFAST AS NEEDED 30 tablet 5    potassium chloride 20 MEQ ER tablet Take 1 tablet (20 mEq total) by mouth daily. 90 tablet 3    rosuvastatin (CRESTOR) 10 MG tablet Take 1 tablet (10 mg total) by mouth nightly. 90 tablet 3    triamcinolone (KENALOG) 0.1 % cream Apply 1 application topically two (2) times a day as needed WHEN ALLERGIES FLARE-UP AND CAUSES RASH / ITCHING 80 g 0    ubrogepant (UBRELVY) 50 mg tablet Take 50 mg by mouth for headache. May repeat in 2 hours, if needed. LIMIT 2 PILLS PER DAY AND NO MORE THAN 4 PILLS PER WEEK. 16 tablet 3    XHANCE 93 mcg/actuation AerB 1 spray into each nostril two (2) times a day as needed. 16 mL 11    albuterol HFA 90 mcg/actuation inhaler Inhale 2 puffs every four (4) hours as needed for wheezing. 54 g 0    azelastine (OPTIVAR) 0.05 % ophthalmic solution Administer 2 drops to both eyes daily as needed. 6 mL 5    empty container Misc Use as directed to dispose of Xolair syringes 1 each 2    escitalopram oxalate (LEXAPRO) 10 MG tablet 1/2 tab PO Daily for 6 days. If well tolerated, increase to 1 whole tablet PO daily. 30 tablet 1    nortriptyline (PAMELOR) 25 MG capsule Take 1 capsule (25 mg total) by mouth nightly. 90 capsule 1    omalizumab (XOLAIR) 75 mg/0.5 mL syringe Inject under the skin.      PROCHAMBER Spcr        Current Facility-Administered Medications   Medication Dose Route Frequency Provider Last Rate Last Admin    omalizumab (XOLAIR) injection 300 mg  300 mg Subcutaneous Q28 Days Volertas, Sofija Dalia, MD   300 mg at 03/09/22 1441       ROS:     All ROS negative unless otherwise noted in HPI.    Vital Signs:     Wt Readings from Last 3 Encounters:   08/25/22 72.2 kg (159 lb 3.2 oz)   08/12/22 73.5 kg (162 lb)   08/11/22 73.8 kg (162 lb 11.2 oz)     Temp Readings from Last 3 Encounters:   08/31/22 36.3 ??C (97.3 ??F) (Skin)   08/25/22 36.7 ??C (98 ??F) (Temporal)   06/08/22 36.3 ??C (97.4 ??F) (Temporal)     BP Readings from Last 3 Encounters:   08/31/22 116/70   08/25/22 104/60   08/12/22 121/81     Pulse Readings from Last 3 Encounters:   08/31/22 86   08/25/22 80   08/12/22 69     There is no height or weight on file to calculate BMI.    Objective:     Physical Exam  Nursing note reviewed.   Constitutional:       General: Patient is not in acute distress.     Appearance: Well-developed.   Pulmonary:      Effort: Pulmonary effort is normal. No respiratory distress.   Neurological:      Mental Status: Alert and oriented to person, place, and time.   Psychiatric:         Behavior: Behavior normal.         Thought Content: Thought content normal.         Judgment: Judgment normal.     Labs:     No results found for this visit  on 09/14/22.    I attest that I, Rayetta Humphrey, personally documented this note while acting as scribe for Royal Hawthorn, MD.      Rayetta Humphrey, Scribe.  09/14/2022     The documentation recorded by the scribe accurately reflects the service I personally performed and the decisions made by me.    Royal Hawthorn, MD

## 2022-09-15 DIAGNOSIS — R6889 Other general symptoms and signs: Principal | ICD-10-CM

## 2022-09-15 DIAGNOSIS — G43009 Migraine without aura, not intractable, without status migrainosus: Principal | ICD-10-CM

## 2022-09-15 DIAGNOSIS — N951 Menopausal and female climacteric states: Principal | ICD-10-CM

## 2022-09-16 ENCOUNTER — Ambulatory Visit: Admit: 2022-09-16 | Discharge: 2022-09-17 | Payer: MEDICARE | Attending: Anesthesiology | Primary: Anesthesiology

## 2022-09-16 DIAGNOSIS — M5417 Radiculopathy, lumbosacral region: Principal | ICD-10-CM

## 2022-09-16 DIAGNOSIS — M542 Cervicalgia: Principal | ICD-10-CM

## 2022-09-16 DIAGNOSIS — M47812 Spondylosis without myelopathy or radiculopathy, cervical region: Principal | ICD-10-CM

## 2022-09-16 DIAGNOSIS — M7918 Myalgia, other site: Principal | ICD-10-CM

## 2022-09-16 DIAGNOSIS — M47816 Spondylosis without myelopathy or radiculopathy, lumbar region: Principal | ICD-10-CM

## 2022-09-16 DIAGNOSIS — M170 Bilateral primary osteoarthritis of knee: Principal | ICD-10-CM

## 2022-09-16 MED ORDER — BACLOFEN 20 MG TABLET
ORAL_TABLET | 2 refills | 0 days | Status: CP
Start: 2022-09-16 — End: ?

## 2022-09-16 MED ORDER — GABAPENTIN 800 MG TABLET
ORAL_TABLET | Freq: Two times a day (BID) | ORAL | 0 refills | 90 days | Status: CP
Start: 2022-09-16 — End: ?

## 2022-09-16 NOTE — Unmapped (Signed)
Today, patient c/o bil posterior neck pain that did not seem to improve after most recent TPI 08/31/22. Overall patient seems to be doing well and is still active with aquatic aerobics. Patient is open to PT and alternative medicine modalities like acupuncture and resuming chiropractor.     Natalie [name] has had quite a few CSI with orthopedics, and we discussed the need to limit steroid exposures due to likely side effects of repeat exposures, especially in a short period of time. Goal is 3-4x/year.     Myofascial pain syndrome  Natalie [name] appeared to not have much cervical relief after most recent TPI.   - Referred to PT   - Continue Baclofen 20 mg TID, refilled x3 months     **Functional Mobility Assessment Scale (FMAS) Patient Questionnaire**  1.Walking on a flat surface: 0-No difficulty  2.Walking up and down stairs:0-No difficulty  3.Sitting down and standing up from a chair:0-No difficulty  4.Getting in and out of bed:0-No difficulty  5.Bending down to pick up an object:0-No difficulty  6.Reaching overhead to retrieve an item:0-No difficulty  7.Carrying a light object (e.g., a bag of groceries):0-No difficulty  8.Performing household chores (e.g., vacuuming, dusting):1-Mild Difficulty  9.Driving or riding in a VWU:9-WJXB Difficulty  10.Performing personal care activities (e.g., bathing, dressing):0-No difficulty     Additional Comments: None     Total Score: 2     Interpretation: 0-10: Minimal impact on function        Cervicalgia  - Referred to PT   - Reviewed patient's XR imaging again  - Patient has positive facet loading bilaterally as well as paraspinal muscle tenderness in the cervical area, worse on the right than the left  - Cervical MBB/RFA scheduled for Jan  -Natalie [name] has failed medications, TPI, PT, HEP     Lumbosacral radiculopathy    Reports this is tolerable and managed appropriately with current medications.   Having sustained relief following caudal ESI in August-70%  - Continue Gabapentin 800 mg TID, refilled x3 months  - Continue Voltaren gel  - Continue Nortriptyline 50 mg at bedtime per Neurology     Chronic pain syndrome   - Continue Percocet 10-325 mg QID prn, refilled x 3 months   - UDS UTD  - Patient has been exercising with benefit.     The patient does appear to be utilizing pain medications appropriately and does report that the medications do improve patient's quality of life and functionality level.           HPI  Natalie [name] is a 52 y.o. being followed at [facility] clinic for complaint of chronic pain localized to her neck, upper back, lower back, bilateral hips and knees.      Patient was last seen in April with [name]. Since then, the patient has followed with Pain Psych. The patient underwent TPI's with our clinic on 05/03/22, RFA of bilateral C4-6 on 05/11/22, and TPIs on 08/31/22. The patient unnderwent Botox for Migraines on 08/11/22.      Today, patient states that cervical pain keeps her up at night but relatively controlled throughout day with medications. Patient also reports intermittent moderate localized L upper scapular sharp pain that worsens upon palpation that Natalie [name] reports has been ongoing since last RFA in April. Natalie [name] states Natalie [name] is compliant with medications but has successfully weaned off celebrex 200 daily for the past 2 months w/o any complications. Pt also states that current medications are working and well tolerated.  Patients current medication regimen:  - Percocet 10-325 mg q6h prn   - Gabapentin 100/800 mg daily   - Celebrex 200 mg daily (no longer taking)   - Voltaren gel-using regularly  - Baclofen 20mg  TID  - Nortriptyline 50 mg at bedtime (per neurolog      Physical Exam     VITALS:   There were no vitals filed for this visit.            Wt Readings from Last 3 Encounters:   08/25/22 72.2 kg (159 lb 3.2 oz)   08/12/22 73.5 kg (162 lb)   08/11/22 73.8 kg (162 lb 11.2 oz)      GENERAL: Well developed, well-nourished female and is in no apparent distress. The patient is pleasant and interactive. Patient is a good historian.  No evidence of sedation or intoxication.    HEENT: Normocephalic/atraumatic.   CARDIOVASCULAR:  Warm, well perfused  RESPIRATORY:  Normal work of breathing, no supplemental 02  EXTREMITIES: No clubbing, cyanosis noted.  NEUROLOGIC:  Alert and oriented, speech fluent, normal language. Cranial nerves grossly intact.  Sensation Intact to light touch throughout the bilateral upper and lower extremities.. Reflexes were 1+ bilaterally and were equal in upper and lower extremities  MUSCULOSKELETAL:  Motor function  5/5 in upper and lower extremities.  Patient rises from a seated position with no difficulty.  The patient was able to ambulate with no difficulty throughout the clinic today without the assistance of a walking aid-None.   Paraspinal tenderness to palpation present in the Cervical spine.   Muscles were in spasm/tight.  SKIN: No obvious rashes, lesions, or erythema.  PSY: Appropriate, full range affect, no psychomotor retardation

## 2022-09-16 NOTE — Unmapped (Signed)
Today we did the following   -It was good to see you    -If we have prescribed you a medication, be prepared that your insurance company may require additional paperwork (even for generic medication), typically called a prior authorization.  This sometimes can delay the prescription, but know that our office is very efficient at taking care of these.  If it is significantly delayed, you may call the pharmacy for updates, or call the office.    -Unfortunately many opioid medications have been on shortage lately.  It is helpful to contact your pharmacy up to a week before your fill date to verify if they will have medication (sometimes they know, sometimes they may not).  If we need to send a prescription to a different pharmacy, please let us know what pharmacy has it in stock and provide [place] the name, address and phone number so we can transfer a prescription.  The earlier you are able to do this, decreases the chance that you run out of medication.  It has been challenging, but we will do our best to accommodate, but many times it takes [place] 48-72 hours to respond to phone calls or mychart messages, we typically are unable to make last minute changes.    Narcan is now available Over the Counter (OTC), meaning you can pick it up at many pharmacies without a prescription.  The cost is around $45.      -Medications refilled as needed.  Percocet for 3 months.    -Continue to be active    -Referral to PT to work on the neck pain    -Follow up in 3 months    -Please plan to arrive to all appointments at least 30 minutes prior to your scheduled appointment time. Failure to do so may delay your visit.    *Bring opioid medication with you to each visit    MyChart Messages:  Please use MyChart for non-urgent symptoms or matters such as general questions, non-urgent prescription refills, or non-urgent scheduling issues. For your safety and best  care please DO NOT use MyChart messages to report emergent or urgent symptoms as messages are only checked during regular business hours.  These messages are checked by the nurses during normal business hours 8:30 am-4:30 pm Monday-Friday every 24-48 hours and are for non-urgent, non-emergent concerns. You may be asked to return for a follow up visit if it is deemed your questions are best handled in the clinic setting.  Please call our nurse line below for more time sensitive issues. If our office is closed you can contact the hospital operator to reach the on call provider available for urgent/emergent issues only.     DO:  1. Read the medication guide  2. Take medication exactly as prescribed  3. Store medication in a safe place, away from children and pets, or anyone who might  steal or misuse it. Store it in a safe and secure place (lock box). Also secure any  unfilled prescriptions.   4. Bring unused medication to the next visit so it can be disposed of safely  5. Tell the prescribing provider if you experience any side effects from the medication. You may also report side effects to the FDA at 1-800-FDA-1088.  6. DO check your states rules regarding opioids and driving.    DO NOT:  1. Give your medications to others  2. Take medication unless it was prescribed to you  3. Take more medication than prescribed  4. Stop taking medication without first talking to your provider  5. Break, chew, crush, dissolve, or inject medications. If you cannot swallow your medications, talk to your provider.   6. Drink alcohol or use illegal drugs while taking this medication    BENZODIAZEPINES: Taking opioid pain medications in combination with benzodiazepines can increase the risk of overdose and death.     -Because of the high volume of calls we receive and the high demand for our clinical services, we are occupied all day providing care for patients in the clinic. This leaves little time to respond to phone calls, and we are generally unable to discuss patient care advice over the telephone. If you are experiencing a medication side effect or complication, you can call and let us know, but we will typically not make a medication substitution or change over the telephone.     We are generally unable to respond acutely to a flare up of pain, as this is quite common in our patients and needs to be dealt with as part of the long term management plan. Please make an appointment with [place] if you wish to discuss a matter in any detail. Should you still need to call, please do so at [phone].     Please do not call for early medication refills.     Thank you for choosing [facility]. It was a pleasure to see you in clinic today. Please contact us with any questions or concerns at [phone].     Criss Rosales, MD    09/16/2022  Anesthesiologist and Pain Management Provider  [facility]      -If you or someone you know is having a mental health emergency, you can dial [phone] (instead of 911), for the mental health emergency line.      What are common side effects from pain medications you may be taking?    Do not drive or do anything with responsibility until you know you are tolerating a new medication.    Many pain type medications can be sedating, especially in combination with others.  They can also decrease your breathing especially in combination with other medications.  If a medication is not effective for you, talk to [place] about weaning off (do not stop abruptly on your own), so we do not just continue it.    Only take medications as prescribed.  If a medication is prescribed as needed, it means you can use it as you need it and do not have to take it every day.  Do not ever take more than what is prescribed to you for any medication.  Not only could this be dangerous, but if you do not take it as prescribed we may have to discontinue it due to safety concerns.  If you feel like your medications are not controlling your medications, do not adjust the medication on your own.  Please discuss at your next clinic visit and we can determine a plan.    Opioids/Narcotics (Examples: oxycodone, hydrocodone, tramadol, morphine, hydromorphone, fentanyl patch)  ?? Constipation-It is important to treat this early and effectively - your doctor can help!  ?? Itching  ?? Nausea  ?? Dizziness or lightheadedness  ?? Drowsiness or sedation  ?? Decreased breathing   ?? Addiction  Sexual difficulties or loss of interest.  Mood changes.  Cognitive impairment (not thinking or responding normally).     Anticonvulsants (Examples: Gabapentin, Pregabalin, Carbamazepine, topiramate)  ?? Dizziness or lightheadedness  ?? Drowsiness  or sedation  ?? Nausea  ?? Leg swelling     Antidepressants (Examples: duloxetine,amitriptyline, nortriptyline, venlafaxine)  New warning in 2019 that these medications in combination with opioids can cause respiratory depression.  ?? Dizziness or lightheadedness  ?? Drowsiness or sedation  ?? Constipation  ?? Urinary retention  ?? Dry mouth  ?? Nausea  ?? Difficulty falling asleep     NSAIDS (Examples: Ibuprofen, naproxen, meloxicam, celecoxib)  -The FDA put out guidelines in 2015 that NSAIDs should not be used regularly due to safety concerns.  OK to use a few times a month, or for 1-2 weeks for a flare of pain.  If you take NSAIDs everyday, we should discuss this and stop it due to safety.  ?? Bleeding  ?? Stomach ulcers  ?? Kidney problems  -Heart Attack  -Stroke     Muscle relaxants (Examples: cyclobenzaprine, tizanidine, baclofen)  ?? Dizziness or lightheadedness  ?? Drowsiness or sedation  ?? Nausea  ?? Low blood pressure  ?? Dry mouth     Many pain medications can lead to a rare, potentially dangerous condition called serotonin syndrome. Symptoms can include tremors, agitation, fever, muscle rigidity/contractions, diarrhea, excessive sweating, palpitations and high blood pressure. If these symptoms arise, please contact us or report to the emergency room for further management.      Your pharmacist can answer any questions you may have in regard to all your medications or possible interactions.    OPIOID(NARCOTIC) MEDICATIONS    You must obtain you narcotic/opioid medications from the [facility] and inform your Pain physician if you receive pain medications (narcotics) from another doctor or emergency room.    It is important that you secure your pain medications to keep them safe from children. You must not share, sell or trade your pain medications with anyone. You must not take anyone else's pain medications. You should not take more pain medications than prescribed without asking your pain doctor first.    You will not receive extra refills for lost or stolen medications, regardless of the circumstances. You may be charged with driving under the influence (DUI) if you drive while on pain medications.    Pain medications can cause sleepiness, dangerously slow breathing and even death. If you or a family member notice a change in your breathing, notify the pain physician immediately.  If you stop breathing or are found unconscious, your family should immediately call 911. Use of pain medicines with alcohol can increase the risk of these complications.    If you stop taking pain medications too quickly, you may have side effects such as sweating, shaking, loss of temper, nausea, diarrhea and increased pain.    A narcotic overdose is the misuse or overuse (doubling up on pills or taking doses too close together) of a narcotic drug. A narcotic overdose can make you pass out and stop breathing. This is especially true if you drink alcohol or use valium-like medications in addition to the opioid pain medication.  If you stop breathing or are found unconscious, your family should immediately call 911.    You may experience constipation while taking these medications.  You may need to use a stool softener and laxative.    Opiates (narcotics) are medicines used to relieve moderate to severe pain. They may be used for a short time for pain, such as after surgery. Or they may be used for long-term pain. They don't cure a health problem. But they help you manage the pain.  Opiates relieve pain by changing the way your body feels pain and the way you feel about pain. Opiates are powerful medicines. You may need to take extra steps to stay safe. Taking too much (overdose) of an opiate can cause death.    SPECIAL RISK:  Taking your medication in combination with a benzodiazepine medication (nerve pill such as Valium, Xanax, Klonopin, Ativan) can increase the risk to you of overdose and death.      What to know about taking this medicine  Your body gets used to opiates if you take them all the time. You could have withdrawal symptoms when you stop taking them. Symptoms include nausea, sweating, chills, diarrhea, and shaking. But you can avoid these symptoms if you slowly stop taking the medicine as your doctor tells you to.  You have a small chance of addiction if you take opiates as prescribed. Your risk is a bit higher if you have abused drugs in the past.  Some opiates have acetaminophen in them. Check the labels on all the other medicines you take. This includes over-the-counter drugs. Many medicines have acetaminophen. Do not take others with acetaminophen in them unless your doctor has told you to. Taking too much acetaminophen can be harmful. Talk to your doctor or pharmacist if you have questions about this.  Be sure you know how to safely get rid of any leftover medicine. Talk to your doctor or pharmacist about how to do this. Ask for written instructions.    When should you call for help?  Call 911 anytime you think you may need emergency care. For example, call if:  You have trouble breathing.  You have swelling of your face, lips, tongue, or throat.  You have signs of an overdose. These include:  Cold, clammy skin.  Confusion.  Severe nervousness or restlessness.  Severe dizziness, drowsiness, or weakness.  Slow breathing.  Seizures.    Get rid of unused opioid pain medication safely - If you have opioid pills, liquid or patches that you are not going to use, get rid of them right away. Check the [place] Drug Enforcement Administration (DEA) website (http://www.tucker.net/) for approved locations near you.

## 2022-09-16 NOTE — Unmapped (Signed)
Medication:oxyCODONE-acetaminophen (PERCOCET) 10-325 mg per tablet   ZOX:WRUE 1 tablet by mouth every four (4) hours as needed for pain. Max 4 tabs/day.   Quantity on RX: #120  Filled on:08/31/22  Pill count today: # 55, Patient states she has additional 27 tablets in pillbox at home

## 2022-09-16 NOTE — Unmapped (Deleted)
Department of Anesthesiology  [facility]  [address], [address]  [place], [place] [address]  [phone]    Chronic Pain Follow Up Note  No diagnosis found.    Assessment and Plan  Attending: [name] is a 52 y.o. female who is being followed at the [facility] with a history of chronic pain localized to bilateral shoulder, knee as well as cervical thoracic and lumbar pain with history of L5-S1 spinal fusion and the pain is associated with bilateral radiculopathy. She was first seen in clinic in August 2018. She has a history of migraines and pseudotumor cerebri, followed by neurology and ophthalmology for treatment.  She has had moderate relief with bilateral knee injections in the past. She stated that she received a caudal epidural steroid injection in the past with good results. She has also had trigger point injections for her paracervical, shoulders, upper mid and lower back areas in 2017.  We have since repeated her caudal ESI and TPI to good effect.  She had a flare of pain in early 2019 and was overtaking her percocet.  After injections pain improved and so did compliance with medications.  Compliance concerns in 2021 that led to additional oversight and then transitioning to [facility] in 10/2019.   However, she did not tolerate belbuca or nucynta and was transitioned back to Percocet but with closer oversight and assistance from pain psychology. She had a flare of pain after her L3-5 RFAs in November 2022.  Between medications and procedures, she has been able to continue working.  She now teaches water aerobics.      July 2024  Patient was last seen in March with Dr. Doylene [name], at which point the patient presented reporting her chronic pain was unchanged, though she had been experiencing acute L flank pain with associated L leg numbness for the past 3 weeks. Although most CT imaging from recent ED visit on 04/01/22 did not show presence of ureteral stones, the etiology of her acute pain was still unclear. Given her new onset symptoms, we planned to initiate celecoxib 200 mg daily for short term use, acetaminophen 500 mg BID PRN, and gabapentin 100 mg daily in the morning. Patient had a history of sedation with current gabapentin 800 mg nightly, thus we planned to be cautious with initiating gabapentin daytime dose. Regarding chronic pain, patient felt that current medications were providing adequate pain control, thus we refilled her oxycodone/APAP and baclofen without change. We advised the patient to follow up with her PCP regarding her flank pain and she planned to follow up with Dr. Oda [name] for evaluation of L leg numbness.     Today, ***    She has had quite a few CSI with orthopedics, and we discussed the need to limit steroid exposures due to likely side effects of repeat exposures, especially in a short period of time.  Goal is 3-4x/year.     Myofascial pain syndrome   ***  - Repeat TPIs scheduled  - Continue Baclofen 20 mg TID, refilled x3 months    **Functional Mobility Assessment Scale (FMAS) Patient Questionnaire**  1.Walking on a flat surface: {ajlfmas:109418}  2.Walking up and down stairs:{ajlfmas:109418}  3.Sitting down and standing up from a chair:{ajlfmas:109418}  4.Getting in and out of bed:{ajlfmas:109418}  5.Bending down to pick up an object:{ajlfmas:109418}  6.Reaching overhead to retrieve an item:{ajlfmas:109418}  7.Carrying a light object (e.g., a bag of groceries):{ajlfmas:109418}  8.Performing household chores (e.g., vacuuming, dusting):{ajlfmas:109418}  9.Driving or riding in a QIO:{NGEXBMW:413244}  10.Performing personal care activities (e.g., bathing, dressing):{ajlfmas:109418}     Additional Comments: {None:21267}    Total Score: {Numbers; 0-40:17906}     Interpretation: {ajlfmas2:109419}      Cervicalgia  ***  - Reviewed patient's XR imaging again  - Patient has positive facet loading bilaterally as well as paraspinal muscle tenderness in the cervical area, worse on the right than the left  - Cervical MBB/RFA scheduled for Jan  -She has failed medications, TPI, PT, HEP    Lumbosacral radiculopathy    ***  Having sustained relief following caudal ESI in August-70%  - Continue Gabapentin 800 mg TID, refilled x3 months  - Continue Voltaren gel  - Continue Nortriptyline 50 mg at bedtime per Neurology    Chronic pain syndrome   ***  - Continue Percocet 10-325 mg QID prn, refilled x 3 months   - UDS performed today  - Patient has been exercising with benefit.    The patient does appear to be utilizing pain medications appropriately and does report that the medications do improve patient's quality of life and functionality level.     Last pain medication agreement on file and signed on  01/11/22  Last urine toxicology:  01/11/22.  Appropriate.  Patient did bring bottles for pill counts today; counts appropriate  [place]CCSRS database was reviewed today and is appropriate:    Last Opioid Change:  Percocet 10-325 decreased from 5x daily PRN to 4x daily PRN 05/2016, 01/2018-increase to #132, several changes in Fall 2021 due to compliance concerns, 11/2019-back to percocet #90, 05/2020-back to #120  Previous Compliance Issues: Did not bring medications 10/2016, no show 04/2017, overtaking 06/2017, short on 09/27/17, lost Rx 10/2017, 01/2018-too late to be seen, 04/2019- 4 days short, but reports some tabs in med box at home. 07/2019 - short, states that she left 28 pills at home and 5 in her car. 08/2019- short, says she overtook during acute increase in pain in setting of acute sinusitis/strep throat. 09/2019- short, says she is unsure why her pill count is low and denies overuse. 10/2019 Did not bring Belbuca to appointment, 11/28/2019 - Did not bring Percocet for pill count, 06/2021 - Did not bring Percocet  Oral Morphine Equivalents: 60  On a Benzodiazepine: no   Naloxone last Ordered:  12/31/2020   NCCSRS database was reviewed 09/16/22.    I have reviewed the [facility] Board statement on use of controlled substances for the treatment of pain as well as the CDC Guideline for Prescribing Opioids for Chronic Pain. I have reviewed the [place] Controlled Substance Monitoring Database.    No follow-ups on file.    Future Considerations:  Repeat caudal ESI  Ensure continued f/u with Dr. [name]    Requested Prescriptions      No prescriptions requested or ordered in this encounter     No orders of the defined types were placed in this encounter.    Risks and benefits of above medications including but not limited to possibility of respiratory depression, sedation, and even death were discussed with the patient who expressed an understanding.     Urine toxicology screen is due today.  Treatment agreement renewal is due today.      Patient was advised to bring all medications to each appointment, including those in her pill boxes at home.    HPI  Natalie [name] is a 52 y.o. being followed at [facility] for complaint of chronic pain  localized to her neck, upper back, lower back, bilateral hips and knees.      Patient was last seen in April with Dr. Doylene [name]. Since then, the patient has followed with Pain Psych. The patient underwent TPI's with our clinic on 05/03/22, RFA of bilateral C4-6 on 05/11/22, and TPIs on 08/31/22. The patient unnderwent Botox for Migraines on 08/11/22.     Today, ***    Patients current medication regimen:  - Percocet 10-325 mg q6h prn   - Gabapentin 100/800 mg daily   - Celebrex 200 mg daily   - Voltaren gel-using regularly  - Baclofen 20mg  TID  - Nortriptyline 50 mg at bedtime (per neurologist)     Current view: Showing all answers           [facility] Return Patient Questionnaire       Question 09/11/2022  2:25 PM EDT - Filed by Patient    What is the reason for your visit? Post procedure assessment    Date of onset of your pain: 08/08/2020    Please rate your pain at its WORST in the past month. 9 Please rate your pain at its LEAST in the past month. 6    Please rate your pain as it is RIGHT NOW. 7    Please rate your pain on AVERAGE in the past month. 7    Please circle the location of your pain.       Please select the words that describe your pain. Aching     Burning     Dull     Pressing     Pulling     Pulsing     Sharp     shooting     Sore     Stabbing Tender Throbbing    How often do you have pain? All the time    When is your pain the worst? During the day    Which of the following have been negatively affected by your pain? Enjoyment of life     General activity     Mood     Normal work     Recreational activities     Relationships with people     Sleep     Walking     Sitting     Standing     Other    Since your last visit:     Have you had any of the following? Primary Care Visit    Do you have any new pain you would like to discuss with your doctor? No    How has your pain changed? Better    Are you currently taking any blood-thinners or anticoagulants? No    If you are on Pain Medication - Are you having any of the following? None     I am stable on my current medication regime     My medications help to improve my quality of live    If you have had a procedure since your last visit, how much pain relief was obtained? 80%    If you have had a procedure since your last visit, were there any complications? Just have one spot that is painful when touched    General: Snoring     Night Sweats    Cardiovascular:       Gastrointestinal - (Intestinal):       Skin: Hives     Itching    Endocrine (Hormonal System): Increased Sweating  Hot Flashes     Intolerance to heat or cold    Musculoskeletal System - (Muscles, Joints and Coverings): Back pain    Neurologic:       Psychiatric:             [facility] Patient-Entered Hpi Selection Questionnaire       Question 09/14/2022  9:35 AM EDT - Ceasar Mons by Patient    What is the primary reason for your visit? Back Pain    Your back pain Has lasted for 3 months or more    When did you first notice your back pain? More than 1 year ago    How often do you feel back pain? Constantly    Since you first noticed your back pain, how has it changed? Unchanged    Where is your back pain located? Buttocks     Lower back - above the waist     Lower back - below the waist     Upper back    How would you describe your back pain? Aching     Burning     Cramping     Shooting     Stabbing    Where does your back pain spread? Left foot     Left knee     Left thigh     Right foot     Right knee    On a scale of 0 to 10 (10 being the worst), how strong is your back pain? 9    Your back pain is... the same all the time    What makes your back pain worse? Bending     Certain positions     Lying down     Sitting     Standing     Twisting    When do you feel stiffness in your back? All day    Are you experiencing any of the following symptoms with your back pain?     Abdominal pain No    Bladder incontinence No    Bowel incontinence No    Chest pain No    Fever No    Headaches Yes    Leg pain Yes    Numbness Yes    Numbness around the anus No    Painful urination No    Paralysis No    Pelvic pain No    Pins and needles Yes    Tingling Yes    Weakness No    Weight loss No    Do any of the following apply to you?               Patient denies homicidal/suicidal ideation.     Allergies  Allergies   Allergen Reactions    Buprenorphine Hcl Itching and Swelling    Pregabalin Swelling    Buprenorphine     Cephalexin Monohydrate     Doxycycline     Oxycodone Hcl     Adhesive Tape-Silicones Itching     Band-aids ok.    Doxycycline Hyclate (Bulk) Nausea And Vomiting     GI Upset    Keflex [Cephalexin] Rash    Opioids - Morphine Analogues Itching    Oxycodone-Acetaminophen Itching and Nausea And Vomiting     GI Upset- GENERIC ONLY- able to take the brand name Percocets     Home Medications    Current Outpatient Medications   Medication Sig Dispense Refill    albuterol HFA 90 mcg/actuation inhaler Inhale 2 puffs every four (  4) hours as needed for wheezing. 54 g 0    albuterol HFA 90 mcg/actuation inhaler Inhale 2 puffs every six (6) hours as needed for wheezing. 54 g 0    albuterol HFA 90 mcg/actuation inhaler Inhale 2 puffs every six (6) hours as needed for wheezing or shortness of breath. 54 g 0    azelastine (ASTELIN) 137 mcg (0.1 %) nasal spray 2 sprays into each nostril two (2) times a day. 30 mL 0    azelastine (OPTIVAR) 0.05 % ophthalmic solution Administer 2 drops to both eyes daily as needed. 6 mL 5    baclofen (LIORESAL) 20 MG tablet 10 - 20 mg three times a day as needed for spasms. 90 tablet 2    cetirizine (ZYRTEC) 10 MG chewable tablet Chew 1 tablet (10 mg total) at bedtime as needed for allergies.      diclofenac sodium (VOLTAREN) 1 % gel Apply 2 g topically four (4) times a day. 100 g 6    empty container Misc Use as directed to dispose of Xolair syringes 1 each 2    EPINEPHrine (EPIPEN) 0.3 mg/0.3 mL injection Inject 0.3 mL (0.3 mg total) into the muscle once as needed for anaphylaxis (Difficulty breathing, throat closing, etc) for up to 1 dose. 1 each 12    escitalopram oxalate (LEXAPRO) 10 MG tablet 1/2 tab PO Daily for 6 days. If well tolerated, increase to 1 whole tablet PO daily. 30 tablet 1    estradiol (ESTRACE) 0.5 MG tablet Take 1 tablet (0.5 mg total) by mouth daily. 30 tablet 1    famotidine (PEPCID) 20 MG tablet Take 1 tablet (20 mg total) by mouth two (2) times a day.      gabapentin (NEURONTIN) 800 MG tablet Take 1 tablet (800 mg total) by mouth two (2) times a day. 180 tablet 0    hydroCHLOROthiazide 12.5 MG tablet TAKE 1 TABLET(12.5 MG) BY MOUTH DAILY AS NEEDED FOR SWELLING 90 tablet 1    nortriptyline (PAMELOR) 25 MG capsule Take 1 capsule (25 mg total) by mouth nightly. 90 capsule 1    omalizumab (XOLAIR) 150 mg/mL syringe Inject 2 mL (300 mg total) under the skin every fourteen (14) days. 2 mL 11    omalizumab (XOLAIR) 75 mg/0.5 mL syringe Inject under the skin. oxyCODONE-acetaminophen (PERCOCET) 10-325 mg per tablet Take 1 tablet by mouth every four (4) hours as needed for pain. Max 4 tabs/day. Fill on or after: 09/04/22. Brand name only due to allergy. 120 tablet 0    pantoprazole (PROTONIX) 40 MG tablet TAKE 1 TABLET(40 MG) BY MOUTH DAILY 30 MINUTES BEFORE BREAKFAST AS NEEDED 30 tablet 5    potassium chloride 20 MEQ ER tablet Take 1 tablet (20 mEq total) by mouth daily. 90 tablet 3    PROCHAMBER Spcr       rosuvastatin (CRESTOR) 10 MG tablet Take 1 tablet (10 mg total) by mouth nightly. 90 tablet 3    triamcinolone (KENALOG) 0.1 % cream Apply 1 application topically two (2) times a day as needed WHEN ALLERGIES FLARE-UP AND CAUSES RASH / ITCHING 80 g 0    ubrogepant (UBRELVY) 50 mg tablet Take 50 mg by mouth for headache. May repeat in 2 hours, if needed. LIMIT 2 PILLS PER DAY AND NO MORE THAN 4 PILLS PER WEEK. 16 tablet 3    XHANCE 93 mcg/actuation AerB 1 spray into each nostril two (2) times a day as needed. 16 mL 11     Current  Facility-Administered Medications   Medication Dose Route Frequency Provider Last Rate Last Admin    omalizumab Geoffry Paradise) injection 300 mg  300 mg Subcutaneous Q28 Days Volertas, Sofija Dalia, MD   300 mg at 03/09/22 1441     Previous Medication Trials:  NSAIDS- celebrex, ibuprofen, naproxen,   Antidepressants-   Anticonvulsant- gabapentin, pregabalin,   Muscle relaxants- baclofen, flexeril, valium, skelaxin, robaxin, tizanidine  Topicals-voltaren gel, lidoderm,   Short-acting opiates-hydromorphone, percocet, oxycodone, tramadol, ultracet, vicodin, nucynta IR  Long-acting opiates- fentanyl, morphine, methadone, oxycontin. Sedation with long acting opioids. Belbuca (side effects)  Anxiolytics-   Other-     Previous Interventions  PT/Aquatic therapy/TENS/Meidcation/Epidural injections/Nerve Block/ Other injections/Surgery/Psychological Counseling  Before our clinic:  - L3-5 MBBB 11/2015 with good benefit; repeat did not help per patient  - B/l trochanteric injections 10/2014  - TPI 11/2015 upper neck to lower back  - B/l knee injections 2017  - Caudal epidural steroid injection (unsure of timing) with improvement in radiculopathy symptoms  Previous tests include MRI, XRAYs, CT scan and NCS/EMG  [facility]  Caudal ESI 10/20/16, 01/2017, 07/14/17, 10/27/17, 03/22/18, 11/16/18, 05/25/19,11/26/19  Knee injection-[place] 10/22/16  TPI 10/28/16, 01/2017, 07/14/17, 08/11/17, 10/27/17, 03/22/18, 12/17/19  Lumbar L2, L3, L4 MBB - >80% relief x2 in October 2022  Lumbar L2, L3, L4 RFA - 12/10/20 - initial flare-likely neuritis. Can assess efficacy longer term.  PT-07/2018  Bilateral knee IA CSI-Sports med 02/16/19  Trigger point injections 03/05/20 and 04/21/20, 05/30/20, 07/09/20, 08/21/20, 09/25/20, 11/05/20  Caudal ESI 03/19/20, 08/11/20, 09/01/21  TENS unit-ordered 05/2020  12/24/21-bilateral GTB CST ortho  11/26/21-ortho knee CSI  11/19/21-ortho knee CSI  Cervical MBB-TBD    Imaging:  Cervical XRAY 11/20/21  Impression   Mild multilevel degenerative disc disease, greatest at C4-C5 and C5-6 and unchanged from the prior CT on 08/08/2020. Anterolisthesis of C4 on C5 is also unchanged.       Review Of Systems  See questionnaire above     Answers submitted by the patient for this visit:  Back Pain Questionnaire (Submitted on 09/14/2022)  Chief Complaint: Back pain  Chronicity: chronic  Onset: more than 1 year ago  Frequency: constantly  Progression since onset: unchanged  Pain location: gluteal, lumbar spine, sacro-iliac, thoracic spine  Pain quality: aching, burning, cramping, shooting, stabbing  Radiates to: left foot, left knee, left thigh, right foot, right knee  Pain - numeric: 9/10  Pain is: the same all the time  Aggravated by: bending, position, lying down, sitting, standing, twisting  Stiffness is present: all day  abdominal pain: No  bladder incontinence: No  bowel incontinence: No  chest pain: No  fever: No  headaches: Yes  leg pain: Yes  numbness: Yes  perianal numbness: No  dysuria: No  paresis: No  pelvic pain: No  paresthesias: Yes  tingling: Yes  weakness: No  weight loss: No       Physical Exam    VITALS:   There were no vitals filed for this visit.      Wt Readings from Last 3 Encounters:   08/25/22 72.2 kg (159 lb 3.2 oz)   08/12/22 73.5 kg (162 lb)   08/11/22 73.8 kg (162 lb 11.2 oz)     GENERAL: Well developed, well-nourished {loboncobese:42394} and is in {BLANK ZOXWRU:04540} distress. The patient is pleasant and interactive. Patient is a good historian.  No evidence of sedation or intoxication.    HEENT: Normocephalic/atraumatic.   CARDIOVASCULAR:  {ajlcardiacexam:61906}  RESPIRATORY:  {ajlpulmexam:61907::Normal work of breathing,  no supplemental 02}  EXTREMITIES: No clubbing, cyanosis noted.  NEUROLOGIC:  Alert and oriented, speech fluent, normal language. Cranial nerves grossly intact.  Sensation {ajlneuroexam:62877}. Reflexes were {ajlreflexes:69631} and {were/were not:54765} equal in upper and lower extremities  MUSCULOSKELETAL:  Motor function  {Pain Motor Numbers:[id number]::5/5} in {ajlupperlower:48905::upper,lower} extremities.  Patient rises from a seated position with {ajlambulate3:46823::no} difficulty.  The patient was able to ambulate with {ajlambulate3:46823::no} difficulty throughout the clinic today {With/without:5700} the assistance of a walking aid-{ajlcane:46931::None}.   Paraspinal tenderness to palpation {present/not present:25705} in the {Cervical Thoracic Lumbar:47568} spine.   Muscles {WERE / WERE NOT:19253} in spasm/tight.  SKIN: No obvious rashes, lesions, or erythema.  PSY: Appropriate, full range affect, no psychomotor retardation

## 2022-09-18 NOTE — Unmapped (Addendum)
Department of Anesthesiology  Natalie [name]  [address]  [place], [place] [address]  [phone]    Chronic Pain Follow Up Note  1. Cervicalgia    2. Primary osteoarthritis of both knees    3. Spondylosis of lumbar region without myelopathy or radiculopathy    4. Spondylosis without myelopathy or radiculopathy, cervical region    5. Lumbosacral radiculopathy    6. Myofascial pain syndrome      Assessment and Plan  Attending: [name] is a 52 y.o. female who is being followed at the [facility] with a history of chronic pain localized to bilateral shoulder, knee as well as cervical thoracic and lumbar pain with history of L5-S1 spinal fusion and the pain is associated with bilateral radiculopathy. She was first seen in clinic in August 2018. She has a history of migraines and pseudotumor cerebri, followed by neurology and ophthalmology for treatment.  She has had moderate relief with bilateral knee injections in the past. She stated that she received a caudal epidural steroid injection in the past with good results. She has also had trigger point injections for her paracervical, shoulders, upper mid and lower back areas in 2017.  We have since repeated her caudal ESI and TPI to good effect.  She had a flare of pain in early 2019 and was overtaking her percocet.  After injections pain improved and so did compliance with medications.  Compliance concerns in 2021 that led to additional oversight and then transitioning to [facility] in 10/2019.   However, she did not tolerate belbuca or nucynta and was transitioned back to Percocet but with closer oversight and assistance from pain psychology. She had a flare of pain after her L3-5 RFAs in November 2022.  Between medications and procedures, she has been able to continue working.  She now teaches water aerobics.      August 2024  Patient was last seen in March with Dr. Doylene [name], at which point the patient presented reporting her chronic pain was unchanged, though she had been experiencing acute L flank pain with associated L leg numbness for the past 3 weeks. Although most CT imaging from recent ED visit on 04/01/22 did not show presence of ureteral stones, the etiology of her acute pain was still unclear. Given her new onset symptoms, we planned to initiate celecoxib 200 mg daily for short term use, acetaminophen 500 mg BID PRN, and gabapentin 100 mg daily in the morning. Patient had a history of sedation with current gabapentin 800 mg nightly, thus we planned to be cautious with initiating gabapentin daytime dose. Regarding chronic pain, patient felt that current medications were providing adequate pain control, thus we refilled her oxycodone/APAP and baclofen without change. We advised the patient to follow up with her PCP regarding her flank pain and she planned to follow up with Dr. Oda [name] for evaluation of L leg numbness.     Today, patient c/o bil posterior neck pain that did not seem to improve after most recent TPI 08/31/22. Overall patient seems to be doing well and is still active with aquatic aerobics. Patient is open to PT and alternative medicine modalities like acupuncture and resuming chiropractor.      She has had quite a few CSI with orthopedics, and we discussed the need to limit steroid exposures due to likely side effects of repeat exposures, especially in a short period of time. Goal is 3-4x/year.  Myofascial pain syndrome  She appeared to not have much cervical relief after most recent TPI.   - Referred to PT   - Continue Baclofen 20 mg TID, refilled x3 months     **Functional Mobility Assessment Scale (FMAS) Patient Questionnaire**  1.Walking on a flat surface: 0-No difficulty  2.Walking up and down stairs:0-No difficulty  3.Sitting down and standing up from a chair:0-No difficulty  4.Getting in and out of bed:0-No difficulty  5.Bending down to pick up an object:0-No difficulty  6.Reaching overhead to retrieve an item:0-No difficulty  7.Carrying a light object (e.g., a bag of groceries):0-No difficulty  8.Performing household chores (e.g., vacuuming, dusting):1-Mild Difficulty  9.Driving or riding in a ZOX:0-RUEA Difficulty  10.Performing personal care activities (e.g., bathing, dressing):0-No difficulty     Additional Comments: None     Total Score: 2     Interpretation: 0-10: Minimal impact on function        Cervicalgia  - Referred to PT   - Reviewed patient's XR imaging again  - Patient has positive facet loading bilaterally as well as paraspinal muscle tenderness in the cervical area, worse on the right than the left  - S/p Cervical RFA 05/11/22  -She has failed medications, TPI, PT, HEP     Lumbosacral radiculopathy    Reports this is tolerable and managed appropriately with current medications.   Having sustained relief following caudal ESI in August 2023-70%  - Continue Gabapentin 800 mg TID, refilled x3 months  - Continue Voltaren gel  - Continue Nortriptyline 50 mg at bedtime per Neurology     Chronic pain syndrome   - Continue Percocet 10-325 mg QID prn, refilled x 3 months   - UDS UTD  - Patient has been exercising with benefit.    The patient does appear to be utilizing pain medications appropriately and does report that the medications do improve patient's quality of life and functionality level.     Last pain medication agreement on file and signed on  01/11/22  Last urine toxicology:  01/11/22.  Appropriate.  Patient did bring bottles for pill counts today; counts appropriate  [place] database was reviewed today and is appropriate:    Last Opioid Change:  Percocet 10-325 decreased from 5x daily PRN to 4x daily PRN 05/2016, 01/2018-increase to #132, several changes in Fall 2021 due to compliance concerns, 11/2019-back to percocet #90, 05/2020-back to #120  Previous Compliance Issues: Did not bring medications 10/2016, no show 04/2017, overtaking 06/2017, short on 09/27/17, lost Rx 10/2017, 01/2018-too late to be seen, 04/2019- 4 days short, but reports some tabs in med box at home. 07/2019 - short, states that she left 28 pills at home and 5 in her car. 08/2019- short, says she overtook during acute increase in pain in setting of acute sinusitis/strep throat. 09/2019- short, says she is unsure why her pill count is low and denies overuse. 10/2019 Did not bring Belbuca to appointment, 11/28/2019 - Did not bring Percocet for pill count, 06/2021 - Did not bring Percocet  Oral Morphine Equivalents: 60  On a Benzodiazepine: no   Naloxone last Ordered:  12/31/2020   [place] database was reviewed 09/18/22.    I have reviewed the [facility] Medical Board statement on use of controlled substances for the treatment of pain as well as the CDC Guideline for Prescribing Opioids for Chronic Pain. I have reviewed the [place] Controlled Substance Monitoring Database.    Return in about 3 months (around 12/17/2022) for MD or NP only.  Future Considerations:  Repeat caudal ESI  Ensure continued f/u with Dr. [name]    Requested Prescriptions     Signed Prescriptions Disp Refills    oxyCODONE-acetaminophen (PERCOCET) 10-325 mg per tablet 120 tablet 0     Sig: Take 1 tablet by mouth every four (4) hours as needed for pain. Max 4 tabs/day. Fill on or after: 10/04/22. Brand name only due to allergy.    oxyCODONE-acetaminophen (PERCOCET) 10-325 mg per tablet 120 tablet 0     Sig: Take 1 tablet by mouth every four (4) hours as needed for pain. Max 4 tabs/day. Fill on or after: 11/03/22. Brand name only due to allergy.    oxyCODONE-acetaminophen (PERCOCET) 10-325 mg per tablet 120 tablet 0     Sig: Take 1 tablet by mouth every four (4) hours as needed for pain. Max 4 tabs/day. Fill on or after: 12/03/22. Brand name only due to allergy.    gabapentin (NEURONTIN) 800 MG tablet 180 tablet 0     Sig: Take 1 tablet (800 mg total) by mouth two (2) times a day.    baclofen (LIORESAL) 20 MG tablet 90 tablet 2     Sig: 10 - 20 mg three times a day as needed for spasms.     Orders Placed This Encounter   Procedures    Ambulatory referral to Physical Therapy     Standing Status:   Future     Standing Expiration Date:   09/16/2023     Referral Priority:   Routine     Referral Type:   Physical Therapy     Number of Visits Requested:   1     Risks and benefits of above medications including but not limited to possibility of respiratory depression, sedation, and even death were discussed with the patient who expressed an understanding.     Patient was advised to bring all medications to each appointment, including those in her pill boxes at home.    HPI  Natalie [name] is a 52 y.o. being followed at [facility] for complaint of chronic pain localized to her neck, upper back, lower back, bilateral hips and knees.      Patient was last seen in April with Dr. Doylene [name]. Since then, the patient has followed with Pain Psych. The patient underwent TPI's with our clinic on 05/03/22, RFA of bilateral C4-6 on 05/11/22, and TPIs on 08/31/22. The patient unnderwent Botox for Migraines on 08/11/22.     Today, patient states that cervical pain keeps her up at night but relatively controlled throughout day with medications. Patient also reports intermittent moderate localized L upper scapular sharp pain that worsens upon palpation that she reports has been ongoing since last RFA in April. She states she is compliant with medications but has successfully weaned off celebrex 200 daily for the past 2 months w/o any complications. Pt also states that current medications are working and well tolerated.      Patients current medication regimen:  - Percocet 10-325 mg q6h prn   - Gabapentin 100/800 mg daily   - Celebrex 200 mg daily (no longer taking)   - Voltaren gel-using regularly  - Baclofen 20mg  TID  - Nortriptyline 50 mg at bedtime (per neurology)     Current view: Showing all answers           [facility] Return Patient Questionnaire       Question 09/11/2022  2:25 PM EDT - Filed by  Patient    What is the reason for your visit? Post procedure assessment    Date of onset of your pain: 08/08/2020    Please rate your pain at its WORST in the past month. 9    Please rate your pain at its LEAST in the past month. 6    Please rate your pain as it is RIGHT NOW. 7    Please rate your pain on AVERAGE in the past month. 7    Please circle the location of your pain.       Please select the words that describe your pain. Aching     Burning     Dull     Pressing     Pulling     Pulsing     Sharp     shooting     Sore     Stabbing Tender Throbbing    How often do you have pain? All the time    When is your pain the worst? During the day    Which of the following have been negatively affected by your pain? Enjoyment of life     General activity     Mood     Normal work     Recreational activities     Relationships with people     Sleep     Walking     Sitting     Standing     Other    Since your last visit:     Have you had any of the following? Primary Care Visit    Do you have any new pain you would like to discuss with your doctor? No    How has your pain changed? Better    Are you currently taking any blood-thinners or anticoagulants? No    If you are on Pain Medication - Are you having any of the following? None     I am stable on my current medication regime     My medications help to improve my quality of live    If you have had a procedure since your last visit, how much pain relief was obtained? 80%    If you have had a procedure since your last visit, were there any complications? Just have one spot that is painful when touched    General: Snoring     Night Sweats    Cardiovascular:       Gastrointestinal - (Intestinal):       Skin: Hives     Itching    Endocrine (Hormonal System): Increased Sweating     Hot Flashes     Intolerance to heat or cold    Musculoskeletal System - (Muscles, Joints and Coverings): Back pain    Neurologic: Psychiatric:             [facility] Patient-Entered Hpi Selection Questionnaire       Question 09/14/2022  9:35 AM EDT - Ceasar Mons by Patient    What is the primary reason for your visit? Back Pain    Your back pain Has lasted for 3 months or more    When did you first notice your back pain? More than 1 year ago    How often do you feel back pain? Constantly    Since you first noticed your back pain, how has it changed? Unchanged    Where is your back pain located? Buttocks     Lower back - above the waist  Lower back - below the waist     Upper back    How would you describe your back pain? Aching     Burning     Cramping     Shooting     Stabbing    Where does your back pain spread? Left foot     Left knee     Left thigh     Right foot     Right knee    On a scale of 0 to 10 (10 being the worst), how strong is your back pain? 9    Your back pain is... the same all the time    What makes your back pain worse? Bending     Certain positions     Lying down     Sitting     Standing     Twisting    When do you feel stiffness in your back? All day    Are you experiencing any of the following symptoms with your back pain?     Abdominal pain No    Bladder incontinence No    Bowel incontinence No    Chest pain No    Fever No    Headaches Yes    Leg pain Yes    Numbness Yes    Numbness around the anus No    Painful urination No    Paralysis No    Pelvic pain No    Pins and needles Yes    Tingling Yes    Weakness No    Weight loss No    Do any of the following apply to you?               Patient denies homicidal/suicidal ideation.     Allergies  Allergies   Allergen Reactions    Buprenorphine Hcl Itching and Swelling    Pregabalin Swelling    Buprenorphine     Cephalexin Monohydrate     Doxycycline     Oxycodone Hcl     Adhesive Tape-Silicones Itching     Band-aids ok.    Doxycycline Hyclate (Bulk) Nausea And Vomiting     GI Upset    Keflex [Cephalexin] Rash    Opioids - Morphine Analogues Itching    Oxycodone-Acetaminophen Itching and Nausea And Vomiting     GI Upset- GENERIC ONLY- able to take the brand name Percocets     Home Medications    Current Outpatient Medications   Medication Sig Dispense Refill    albuterol HFA 90 mcg/actuation inhaler Inhale 2 puffs every six (6) hours as needed for wheezing. 54 g 0    albuterol HFA 90 mcg/actuation inhaler Inhale 2 puffs every six (6) hours as needed for wheezing or shortness of breath. 54 g 0    azelastine (ASTELIN) 137 mcg (0.1 %) nasal spray 2 sprays into each nostril two (2) times a day. 30 mL 0    azelastine (OPTIVAR) 0.05 % ophthalmic solution Administer 2 drops to both eyes daily as needed. 6 mL 5    cetirizine (ZYRTEC) 10 MG chewable tablet Chew 1 tablet (10 mg total) at bedtime as needed for allergies.      diclofenac sodium (VOLTAREN) 1 % gel Apply 2 g topically four (4) times a day. 100 g 6    empty container Misc Use as directed to dispose of Xolair syringes 1 each 2    EPINEPHrine (EPIPEN) 0.3 mg/0.3 mL injection Inject 0.3 mL (0.3 mg total) into the muscle  once as needed for anaphylaxis (Difficulty breathing, throat closing, etc) for up to 1 dose. 1 each 12    escitalopram oxalate (LEXAPRO) 10 MG tablet 1/2 tab PO Daily for 6 days. If well tolerated, increase to 1 whole tablet PO daily. 30 tablet 1    estradiol (ESTRACE) 0.5 MG tablet Take 1 tablet (0.5 mg total) by mouth daily. 30 tablet 1    famotidine (PEPCID) 20 MG tablet Take 1 tablet (20 mg total) by mouth two (2) times a day.      hydroCHLOROthiazide 12.5 MG tablet TAKE 1 TABLET(12.5 MG) BY MOUTH DAILY AS NEEDED FOR SWELLING 90 tablet 1    nortriptyline (PAMELOR) 25 MG capsule Take 1 capsule (25 mg total) by mouth nightly. 90 capsule 1    omalizumab (XOLAIR) 150 mg/mL syringe Inject 2 mL (300 mg total) under the skin every fourteen (14) days. 2 mL 11    omalizumab (XOLAIR) 75 mg/0.5 mL syringe Inject under the skin.      pantoprazole (PROTONIX) 40 MG tablet TAKE 1 TABLET(40 MG) BY MOUTH DAILY 30 MINUTES BEFORE BREAKFAST AS NEEDED 30 tablet 5    potassium chloride 20 MEQ ER tablet Take 1 tablet (20 mEq total) by mouth daily. 90 tablet 3    PROCHAMBER Spcr       rosuvastatin (CRESTOR) 10 MG tablet Take 1 tablet (10 mg total) by mouth nightly. 90 tablet 3    triamcinolone (KENALOG) 0.1 % cream Apply 1 application topically two (2) times a day as needed WHEN ALLERGIES FLARE-UP AND CAUSES RASH / ITCHING 80 g 0    ubrogepant (UBRELVY) 50 mg tablet Take 50 mg by mouth for headache. May repeat in 2 hours, if needed. LIMIT 2 PILLS PER DAY AND NO MORE THAN 4 PILLS PER WEEK. 16 tablet 3    XHANCE 93 mcg/actuation AerB 1 spray into each nostril two (2) times a day as needed. 16 mL 11    albuterol HFA 90 mcg/actuation inhaler Inhale 2 puffs every four (4) hours as needed for wheezing. 54 g 0    baclofen (LIORESAL) 20 MG tablet 10 - 20 mg three times a day as needed for spasms. 90 tablet 2    gabapentin (NEURONTIN) 800 MG tablet Take 1 tablet (800 mg total) by mouth two (2) times a day. 180 tablet 0    [START ON 10/04/2022] oxyCODONE-acetaminophen (PERCOCET) 10-325 mg per tablet Take 1 tablet by mouth every four (4) hours as needed for pain. Max 4 tabs/day. Fill on or after: 10/04/22. Brand name only due to allergy. 120 tablet 0    [START ON 11/03/2022] oxyCODONE-acetaminophen (PERCOCET) 10-325 mg per tablet Take 1 tablet by mouth every four (4) hours as needed for pain. Max 4 tabs/day. Fill on or after: 11/03/22. Brand name only due to allergy. 120 tablet 0    [START ON 12/03/2022] oxyCODONE-acetaminophen (PERCOCET) 10-325 mg per tablet Take 1 tablet by mouth every four (4) hours as needed for pain. Max 4 tabs/day. Fill on or after: 12/03/22. Brand name only due to allergy. 120 tablet 0     Current Facility-Administered Medications   Medication Dose Route Frequency Provider Last Rate Last Admin    omalizumab Geoffry Paradise) injection 300 mg  300 mg Subcutaneous Q28 Days Volertas, Sofija Dalia, MD   300 mg at 03/09/22 1441     Previous Medication Trials:  NSAIDS- celebrex, ibuprofen, naproxen,   Antidepressants-   Anticonvulsant- gabapentin, pregabalin,   Muscle relaxants- baclofen, flexeril, valium, skelaxin, robaxin,  tizanidine  Topicals-voltaren gel, lidoderm,   Short-acting opiates-hydromorphone, percocet, oxycodone, tramadol, ultracet, vicodin, nucynta IR  Long-acting opiates- fentanyl, morphine, methadone, oxycontin. Sedation with long acting opioids. Belbuca (side effects)  Anxiolytics-   Other-     Previous Interventions  PT/Aquatic therapy/TENS/Meidcation/Epidural injections/Nerve Block/ Other injections/Surgery/Psychological Counseling  Before our clinic:  - L3-5 MBBB 11/2015 with good benefit; repeat did not help per patient  - B/l trochanteric injections 10/2014  - TPI 11/2015 upper neck to lower back  - B/l knee injections 2017  - Caudal epidural steroid injection (unsure of timing) with improvement in radiculopathy symptoms  Previous tests include MRI, XRAYs, CT scan and NCS/EMG  [facility]  Caudal ESI 10/20/16, 01/2017, 07/14/17, 10/27/17, 03/22/18, 11/16/18, 05/25/19,11/26/19  Knee injection-[place] 10/22/16  TPI 10/28/16, 01/2017, 07/14/17, 08/11/17, 10/27/17, 03/22/18, 12/17/19  Lumbar L2, L3, L4 MBB - >80% relief x2 in October 2022  Lumbar L2, L3, L4 RFA - 12/10/20 - initial flare-likely neuritis. Can assess efficacy longer term.  PT-07/2018  Bilateral knee IA CSI-Sports med 02/16/19  Trigger point injections 03/05/20 and 04/21/20, 05/30/20, 07/09/20, 08/21/20, 09/25/20, 11/05/20  Caudal ESI 03/19/20, 08/11/20, 09/01/21  TENS unit-ordered 05/2020  12/24/21-bilateral GTB CST ortho  11/26/21-ortho knee CSI  11/19/21-ortho knee CSI  Cervical RFA 05/11/22  PT referral 08/2022    Imaging:  Cervical XRAY 11/20/21  Impression   Mild multilevel degenerative disc disease, greatest at C4-C5 and C5-6 and unchanged from the prior CT on 08/08/2020. Anterolisthesis of C4 on C5 is also unchanged.       Review Of Systems  See questionnaire above     Answers submitted by the patient for this visit:  Back Pain Questionnaire (Submitted on 09/14/2022)  Chief Complaint: Back pain  Chronicity: chronic  Onset: more than 1 year ago  Frequency: constantly  Progression since onset: unchanged  Pain location: gluteal, lumbar spine, sacro-iliac, thoracic spine  Pain quality: aching, burning, cramping, shooting, stabbing  Radiates to: left foot, left knee, left thigh, right foot, right knee  Pain - numeric: 9/10  Pain is: the same all the time  Aggravated by: bending, position, lying down, sitting, standing, twisting  Stiffness is present: all day  abdominal pain: No  bladder incontinence: No  bowel incontinence: No  chest pain: No  fever: No  headaches: Yes  leg pain: Yes  numbness: Yes  perianal numbness: No  dysuria: No  paresis: No  pelvic pain: No  paresthesias: Yes  tingling: Yes  weakness: No  weight loss: No       Physical Exam    VITALS:   Vitals:    09/16/22 0826   BP: 122/78   Pulse: 93   Resp: 16   Temp: 36.6 ??C (97.9 ??F)   SpO2: 98%         Wt Readings from Last 3 Encounters:   09/16/22 74.7 kg (164 lb 9.6 oz)   08/25/22 72.2 kg (159 lb 3.2 oz)   08/12/22 73.5 kg (162 lb)     GENERAL: Well developed, well-nourished female and is in no apparent distress. The patient is pleasant and interactive. Patient is a good historian.  No evidence of sedation or intoxication.    HEENT: Normocephalic/atraumatic.   CARDIOVASCULAR:  Warm, well perfused  RESPIRATORY:  Normal work of breathing, no supplemental 02  EXTREMITIES: No clubbing, cyanosis noted.  NEUROLOGIC:  Alert and oriented, speech fluent, normal language. Cranial nerves grossly intact.  Sensation Intact to light touch throughout the bilateral upper and lower extremities.Marland Kitchen  Reflexes were 1+ bilaterally and were equal in upper and lower extremities  MUSCULOSKELETAL:  Motor function  5/5 in upper and lower extremities.  Patient rises from a seated position with no difficulty.  The patient was able to ambulate with no difficulty throughout the clinic today without the assistance of a walking aid-None.   Paraspinal tenderness to palpation present in the Cervical spine.   Muscles were in spasm/tight.  SKIN: No obvious rashes, lesions, or erythema.  PSY: Appropriate, full range affect, no psychomotor retardation    Lab Results   Component Value Date    PLT 246 07/09/2022     Lab Results   Component Value Date    CREATININE 0.70 07/09/2022     No results found for: PT, INR  No results found for: APTT  No results found for: A1C    Lab Results   Component Value Date    ALKPHOS 59 07/09/2022    BILITOT 0.6 07/09/2022    PROT 7.5 07/09/2022    ALBUMIN 4.0 07/09/2022    ALT 20 07/09/2022    AST 30 07/09/2022         Medical Decision Making    Problems Addressed:  2 or more stable chronic illnesses (moderate)    Amount/Complexity:  Reviewed external records:  Fam med 09/14/22.  Pain psych 08/20/22.  Ortho 08/12/22-knee CSI and [place] PDMP (today on day of visit)  Reviewed results:  Labs 07/09/22.  Ordered tests: None  Assessment requiring independent historian: None  Discussion of management/test results with medical professional: None  Independent interpretation of test: None    Risk  Moderate: Prescription Drug Management, Minor surgery w/ risk factors, Elective Major Surgery w/o risk factors, Diagnosis/treatment significantly limited by social determinants of health      We are delivering comprehensive, continuous, longitudinal care for this patient with chronic pain.       I saw and evaluated the patient, participating in the key portions of the service.  I reviewed the medical student's note and agree with the findings and plan.  Criss Rosales, MD

## 2022-09-24 ENCOUNTER — Telehealth: Admit: 2022-09-24 | Discharge: 2022-09-24 | Payer: MEDICARE | Attending: Psychologist | Primary: Psychologist

## 2022-09-24 ENCOUNTER — Telehealth: Admit: 2022-09-24 | Discharge: 2022-09-24 | Payer: MEDICARE | Attending: Internal Medicine | Primary: Internal Medicine

## 2022-09-24 DIAGNOSIS — L501 Idiopathic urticaria: Principal | ICD-10-CM

## 2022-09-24 DIAGNOSIS — F4321 Adjustment disorder with depressed mood: Principal | ICD-10-CM

## 2022-09-24 DIAGNOSIS — F1190 Opioid use, unspecified, uncomplicated: Principal | ICD-10-CM

## 2022-09-24 DIAGNOSIS — L509 Urticaria, unspecified: Principal | ICD-10-CM

## 2022-09-24 DIAGNOSIS — F419 Anxiety disorder, unspecified: Principal | ICD-10-CM

## 2022-09-24 MED ORDER — HYDROXYCHLOROQUINE 200 MG TABLET
ORAL_TABLET | Freq: Two times a day (BID) | ORAL | 12 refills | 30 days | Status: CP
Start: 2022-09-24 — End: ?

## 2022-09-24 NOTE — Unmapped (Signed)
-   continue Zyrtec 20mg  in the morning and 20mg  at night  - continue Xolair 300mg  every 2 weeks    - add on plaquenil 200mg  in the morning and 20mg  at night
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
## 2022-09-24 NOTE — Unmapped (Signed)
Allergy/Immunology   Clinic Note  Return Patient       Assessment and Plan:   Diagnoses:  1. Urticaria    2. Chronic idiopathic urticaria          Assessment and Plan:  Natalie [name] is a 52 y.o. female seen for the following:    Chronic spontaneous urticaria: This is significantly more controlled on xolair 300mg  q2 weeks, but still having frequent breakthrough episodes of hives. Also on cetirizine 20mg  BID.   - continue Zyrtec 20mg  BID  - continue Xolair 300mg  q2 weeks, especially given her BMI is 32, and previous studies have shown increased need of increasing dosing of Xolair at higher BMIs, and she has had improvement with this  - add on plaquenil 200mg  BID    Follow-up:  Return in about 6 months (around 03/25/2023).    --    Trixie Dredge, MD  Allergy and Immunology  [facility]    The patient reports they are physically located in [place] and is currently: at home. I conducted a audio/video visit. I spent  60m 10s on the video call with the patient. I spent an additional 5 minutes on pre- and post-visit activities on the date of service .       Subjective:   Last Visit: 06/22/22  Chief Concern: recurrent pruritus/urticaria  HPI:  I had the pleasure of seeing Natalie [name] in allergy/immunology clinic today, and she is a 52 y.o. female with allergic rhinitis, chronic spontaneous urticaria, on xolair, seen in follow up for chronic urticaria.    2 days out of the two weeks having hives. But generally well controlled. Feels happy with this control.  Overall, feels like the Geoffry Paradise is working really well at every 2 weeks.  Zyrtec 20mg  BID    Also notices that one of her clients has roaches in his house (home nurse), and will have to take extra benadryl after leaving his house  Also notices that smoke causes a headache and itching  Does continue to get some welts on her hands and knees when digging in her garden  Was previously waking up at night with itching, not doing this anymore    Review of Systems:  As per HPI, all other systems reviewed are negative.    Past Medical History:     Past Medical History:   Diagnosis Date    Allergic conjunctivitis 11/23/2018    Anemia 1986    Arthritis     Breast mass     Chondromalacia of left knee     Chondromalacia of right knee     Depressive disorder     Fibrocystic breast     GERD (gastroesophageal reflux disease)     Hematuria     History of kidney stones     History of transfusion     Hyperlipidemia 1999    Hypothyroidism     Lumbar disc disease 1998    She was injured at work at the age of 38 and then had disk surgery 2 years later     Menopause ovarian failure     Menopause, premature     Migraine with aura     Neuromuscular disorder (CMS-HCC) 1999    Obesity (BMI 30-39.9) 11/23/2018    Ovarian cyst     Plantar fasciitis     Pseudotumor cerebri     Rash due to allergy 11/23/2018    Sickle cell trait (CMS-HCC)     Urinary tract infection  Vaginitis        Past Surgical History:   Procedure Laterality Date    BACK SURGERY  2001    BREAST BIOPSY Bilateral     When patient was 16 & 17 Both Negative    HYSTERECTOMY  2008    PR REMOVAL OF TONSILS,12+ Y/O Bilateral 10/10/2019    Procedure: TONSILLECTOMY;  Surgeon: Lona Millard, MD;  Location: OR [place];  Service: ENT    SALPINGOOPHORECTOMY Bilateral 2007    SPINE SURGERY  2001    TOTAL VAGINAL HYSTERECTOMY  01/04/2006    Uterine Prolapse-Dr. Leeanne Rio OP Note    TUBAL LIGATION  1995       Medications:     Current Outpatient Medications:     albuterol HFA 90 mcg/actuation inhaler, Inhale 2 puffs every four (4) hours as needed for wheezing., Disp: 54 g, Rfl: 0    albuterol HFA 90 mcg/actuation inhaler, Inhale 2 puffs every six (6) hours as needed for wheezing., Disp: 54 g, Rfl: 0    albuterol HFA 90 mcg/actuation inhaler, Inhale 2 puffs every six (6) hours as needed for wheezing or shortness of breath., Disp: 54 g, Rfl: 0    azelastine (ASTELIN) 137 mcg (0.1 %) nasal spray, 2 sprays into each nostril two (2) times a day., Disp: 30 mL, Rfl: 0    azelastine (OPTIVAR) 0.05 % ophthalmic solution, Administer 2 drops to both eyes daily as needed., Disp: 6 mL, Rfl: 5    baclofen (LIORESAL) 20 MG tablet, 10 - 20 mg three times a day as needed for spasms., Disp: 90 tablet, Rfl: 2    cetirizine (ZYRTEC) 10 MG chewable tablet, Chew 1 tablet (10 mg total) at bedtime as needed for allergies., Disp: , Rfl:     diclofenac sodium (VOLTAREN) 1 % gel, Apply 2 g topically four (4) times a day., Disp: 100 g, Rfl: 6    empty container Misc, Use as directed to dispose of Xolair syringes, Disp: 1 each, Rfl: 2    EPINEPHrine (EPIPEN) 0.3 mg/0.3 mL injection, Inject 0.3 mL (0.3 mg total) into the muscle once as needed for anaphylaxis (Difficulty breathing, throat closing, etc) for up to 1 dose., Disp: 1 each, Rfl: 12    escitalopram oxalate (LEXAPRO) 10 MG tablet, 1/2 tab PO Daily for 6 days. If well tolerated, increase to 1 whole tablet PO daily., Disp: 30 tablet, Rfl: 1    estradiol (ESTRACE) 0.5 MG tablet, Take 1 tablet (0.5 mg total) by mouth daily., Disp: 30 tablet, Rfl: 1    famotidine (PEPCID) 20 MG tablet, Take 1 tablet (20 mg total) by mouth two (2) times a day., Disp: , Rfl:     gabapentin (NEURONTIN) 800 MG tablet, Take 1 tablet (800 mg total) by mouth two (2) times a day., Disp: 180 tablet, Rfl: 0    hydroCHLOROthiazide 12.5 MG tablet, TAKE 1 TABLET(12.5 MG) BY MOUTH DAILY AS NEEDED FOR SWELLING, Disp: 90 tablet, Rfl: 1    hydroxychloroquine (PLAQUENIL) 200 mg tablet, Take 1 tablet (200 mg total) by mouth two (2) times a day., Disp: 60 tablet, Rfl: 12    omalizumab (XOLAIR) 150 mg/mL syringe, Inject 2 mL (300 mg total) under the skin every fourteen (14) days., Disp: 2 mL, Rfl: 11    omalizumab (XOLAIR) 75 mg/0.5 mL syringe, Inject under the skin., Disp: , Rfl:     [START ON 10/04/2022] oxyCODONE-acetaminophen (PERCOCET) 10-325 mg per tablet, Take 1 tablet by mouth every four (4) hours as  needed for pain. Max 4 tabs/day. Fill on or after: 10/04/22. Brand name only due to allergy., Disp: 120 tablet, Rfl: 0    [START ON 11/03/2022] oxyCODONE-acetaminophen (PERCOCET) 10-325 mg per tablet, Take 1 tablet by mouth every four (4) hours as needed for pain. Max 4 tabs/day. Fill on or after: 11/03/22. Brand name only due to allergy., Disp: 120 tablet, Rfl: 0    [START ON 12/03/2022] oxyCODONE-acetaminophen (PERCOCET) 10-325 mg per tablet, Take 1 tablet by mouth every four (4) hours as needed for pain. Max 4 tabs/day. Fill on or after: 12/03/22. Brand name only due to allergy., Disp: 120 tablet, Rfl: 0    pantoprazole (PROTONIX) 40 MG tablet, TAKE 1 TABLET(40 MG) BY MOUTH DAILY 30 MINUTES BEFORE BREAKFAST AS NEEDED, Disp: 30 tablet, Rfl: 5    potassium chloride 20 MEQ ER tablet, Take 1 tablet (20 mEq total) by mouth daily., Disp: 90 tablet, Rfl: 3    PROCHAMBER Spcr, , Disp: , Rfl:     rosuvastatin (CRESTOR) 10 MG tablet, Take 1 tablet (10 mg total) by mouth nightly., Disp: 90 tablet, Rfl: 3    triamcinolone (KENALOG) 0.1 % cream, Apply 1 application topically two (2) times a day as needed WHEN ALLERGIES FLARE-UP AND CAUSES RASH / ITCHING, Disp: 80 g, Rfl: 0    ubrogepant (UBRELVY) 50 mg tablet, Take 50 mg by mouth for headache. May repeat in 2 hours, if needed. LIMIT 2 PILLS PER DAY AND NO MORE THAN 4 PILLS PER WEEK., Disp: 16 tablet, Rfl: 3    XHANCE 93 mcg/actuation AerB, 1 spray into each nostril two (2) times a day as needed., Disp: 16 mL, Rfl: 11    Current Facility-Administered Medications:     omalizumab Geoffry Paradise) injection 300 mg, 300 mg, Subcutaneous, Q28 Days, Hulen Mandler Dalia, MD, 300 mg at 03/09/22 1441    Allergies:     Allergies   Allergen Reactions    Buprenorphine Hcl Itching and Swelling    Pregabalin Swelling    Buprenorphine     Cephalexin Monohydrate     Doxycycline     Oxycodone Hcl     Adhesive Tape-Silicones Itching     Band-aids ok.    Doxycycline Hyclate (Bulk) Nausea And Vomiting     GI Upset Keflex [Cephalexin] Rash    Opioids - Morphine Analogues Itching    Oxycodone-Acetaminophen Itching and Nausea And Vomiting     GI Upset- GENERIC ONLY- able to take the brand name Percocets       Family History:     Family History   Problem Relation Age of Onset    Hypertension Mother     Heart disease Mother     Arthritis Mother     Depression Mother     Drug abuse Mother     Mental illness Mother     Ulcers Mother     COPD Mother     Heart attack Father     Mental illness Father     Asthma Father     Cancer Sister         lymphoma    Heart attack Maternal Grandmother     Heart disease Maternal Grandmother     Hypertension Maternal Grandmother     Thyroid disease Maternal Grandmother     Stroke Paternal Grandfather     Depression Son     Drug abuse Son     Mental illness Son     COPD Maternal Aunt  Heart attack Cousin 43    Breast cancer Cousin 32    Thyroid disease Other     Alcohol abuse Neg Hx      Sister- allergic rhinitis  Children- allergic rhinitis  Father- asthma  Grandmother- Lyme, Thyroid disease     Social History:     Social History     Tobacco Use    Smoking status: Never     Passive exposure: Past    Smokeless tobacco: Never   Vaping Use    Vaping status: Never Used   Substance Use Topics    Alcohol use: Never    Drug use: Never           Occupational History     Employer: NOT EMPLOYED       Objective:   PE:   General: Well-appearing, non-diaphoretic, in no acute distress. Appropriate build for age.  HEENT: atraumatic, normocephalic. Extraocular muscles intact. NO ocular redness, discharge, swelling or lesions. NO Nasal redness, swelling, discharge, deformity or impetigo/crusting. Respiratory: No increased respiratory effort, non-labored breathing. No flaring/accessory muscle usage on deep inspiration. No cough. Speaking in clear full sentences. I:E ratio within normal limits.  Cutaneous: no visible rashes, marks or bruises. No visible flushing or pallor. No cyanosis noted on face or hands.  Neuro: Cranial nerves grossly normal. Speech normal rate and rhythm. Awake/alert. Orientation arrive to appointment on time with no prompting. Moving both upper extremities equally. Gross motor function intact.  Psych: pleasant mood, normal affect. Well-groomed. Cooperative, appropriate thought content. Attention focused, linear, attends for longer periods of time.    Investigations    Spirometry reviewed and pertinent for the following: None to review    Skin testing reviewed and pertinent for the following: None to review    Laboratory testing reviewed and pertinent for the following:    No visits with results within 4 Week(s) from this visit.   Latest known visit with results is:   Office Visit on 08/25/2022   Component Date Value    Color, UA 08/25/2022 Colorless     Clarity, UA 08/25/2022 Clear     Specific Gravity, UA 08/25/2022 1.007     pH, UA 08/25/2022 7.0     Leukocyte Esterase, UA 08/25/2022 Negative     Nitrite, UA 08/25/2022 Negative     Protein, UA 08/25/2022 Negative     Glucose, UA 08/25/2022 Negative     Ketones, UA 08/25/2022 Negative     Urobilinogen, UA 08/25/2022 <2.0 mg/dL     Bilirubin, UA 54/09/8117 Negative     Blood, UA 08/25/2022 Trace (A)     RBC, UA 08/25/2022 4 (H)     WBC, UA 08/25/2022 1     WBC Clumps 08/25/2022 None Seen     Squam Epithel, UA 08/25/2022 3     Bacteria, UA 08/25/2022 Rare (A)     Hyphal Yeast 08/25/2022 None Seen     Yeast, UA 08/25/2022 None Seen     Urine Collection Type 08/25/2022 Urine, Voided     Rapid Strep A NAA, POC 08/25/2022 Negative     Rapid Strep A NAA Intern* 08/25/2022 QC Acceptable     Rapid Strep a NAA Lot, P* 08/25/2022 28915     Rapid Strep A NAA Expira* 08/25/2022 07/03/23     INSTR S/N Strep POCT 08/25/2022 [id number]     POCT SARS-CoV-2 NAA 08/25/2022 Negative     Rapid Influenza A NAA, P* 08/25/2022 Negative     Rapid Influenza B NAA, P*  08/25/2022 Negative     Rapid RSV NAA, POC 08/25/2022 Negative     POC FLURVID COMMENT 08/25/2022 This test uses Cepheid Xpert Xpress SARS-CoV-2/Flu/RSV PCR assay; FDA has granted Emergency Use Authorization for this test.     Rapid SARS Antigen Lot I* 08/25/2022 44501     Rapid SARS Antigen Expir* 08/25/2022 01/30/23     Urine Culture, Comprehen* 08/25/2022 Normal Urogenital Flora      Cocklebur IgE <0.35 kUA/L <0.35     Cat dander IgE <0.35 kUA/L <0.35    Cot[place]wood (White Poplar) Tree IgE <0.35 kUA/L <0.35    Dog Dander IgE <0.35 kUA/L <0.35    D. farinae IgE <0.35 kUA/L <0.35    D. pteronyssinus IgE <0.35 kUA/L <0.35    Bahia Grass IgE <0.35 kUA/L <0.35    Johnson Grass IgE <0.35 kUA/L <0.35    Timothy Grass IgE <0.35 kUA/L 0.48 High     Alternaria alternata IgE <0.35 kUA/L <0.35    Candida Albicans IgE <0.35 kUA/L <0.35    Cladosporium Herbarum IgE <0.35 kUA/L <0.35    Epicoccum purpurascens IgE <0.35 kUA/L <0.35    Fusarium Proliferatum IgE <0.35 kUA/L <0.35    Aspergillus fumigatus IgE <0.35 kUA/L <0.35    Mucor Racemosus IgE <0.35 kUA/L <0.35    Aspergillus [place] IgE <0.35 kUA/L <0.35    Mouse IgE <0.35 kUA/L <0.35    P chrysogenum (P notatum) IgE <0.35 kUA/L <0.35    Rhizopus nigrican IgE <0.35 kUA/L <0.35    Trichophyton rubrum IgE <0.35 kUA/L <0.35    Setomelanomma rostrata (H. halodes) IgE <0.35 kUA/L <0.35    Mugwort IgE <0.35 kUA/L <0.35    Pigweed, Common IgE <0.35 kUA/L <0.35    English Plantain IgE <0.35 kUA/L <0.35    Giant Ragweed IgE <0.35 kUA/L <0.35    German Cockroach IgE <0.35 kUA/L <0.35    Sheep Sorrel IgE <0.35 kUA/L <0.35    Ragweed, short (common) IgE <0.35 kUA/L <0.35    White Ash Tree IgE <0.35 kUA/L <0.35    Beech (American) tree IgE <0.35 kUA/L <0.35    Birch (Common Silver) tree IgE <0.35 kUA/L <0.35    Box Elder Tree IgE <0.35 kUA/L <0.35    Elm Tree IgE <0.35 kUA/L <0.35    Maple Leaf Sycamore IgE <0.35 kUA/L <0.35    White Oak Tree IgE <0.35 kUA/L <0.35    Pecan Hickory IgE <0.35 kUA/L <0.35    Walnut Tree IgE <0.35 kUA/L <0.35    [place] Grass IgE <0.35 kUA/L <0.35    Goosefoot ([place]) IgE <0.35 kUA/L <0.35    Willow tree IgE <0.35 kUA/L <0.35     Imaging reviewed and pertinent for the following:   Impression   Unchanged benign lymph nodes in the LEFT neck.       Signed (Electronic Signature): 05/08/2021 1:01 PM   Signed By: Leighton Roach, MD       Narrative   Exam:  US SOFT TISSUE HEAD AND NECK       History:  LEFT neck adenopathy.       Technique:  US SOFT TISSUE HEAD AND NECK       Comparison:  08/08/2020. 05/19/2020.       Findings:  Focused sonographic evaluation over the area of palpable concern in the LEFT neck was performed   . Scattered benign lymph nodes are again noted in the LEFT neck. No interval change.

## 2022-09-30 DIAGNOSIS — J3089 Other allergic rhinitis: Principal | ICD-10-CM

## 2022-09-30 MED ORDER — AZELASTINE 137 MCG (0.1 %) NASAL SPRAY
Freq: Two times a day (BID) | NASAL | 0 refills | 55 days | Status: CP
Start: 2022-09-30 — End: ?
  Filled 2022-10-06: qty 30, 25d supply, fill #0

## 2022-09-30 NOTE — Unmapped (Signed)
[facility] Clinical Assessment & Refill Coordination Note    Natalie [name], Natalie [name]: 1970/09/28  Phone: There are no phone numbers on file.    All above HIPAA information was verified with patient.     Was a Nurse, learning disability used for this call? No    Specialty Medication(s):   Inflammatory Disorders: Xolair     Current Outpatient Medications   Medication Sig Dispense Refill    albuterol HFA 90 mcg/actuation inhaler Inhale 2 puffs every four (4) hours as needed for wheezing. 54 g 0    albuterol HFA 90 mcg/actuation inhaler Inhale 2 puffs every six (6) hours as needed for wheezing. 54 g 0    albuterol HFA 90 mcg/actuation inhaler Inhale 2 puffs every six (6) hours as needed for wheezing or shortness of breath. 54 g 0    azelastine (ASTELIN) 137 mcg (0.1 %) nasal spray 2 sprays into each nostril two (2) times a day. 30 mL 0    azelastine (OPTIVAR) 0.05 % ophthalmic solution Administer 2 drops to both eyes daily as needed. 6 mL 5    baclofen (LIORESAL) 20 MG tablet 10 - 20 mg three times a day as needed for spasms. 90 tablet 2    cetirizine (ZYRTEC) 10 MG chewable tablet Chew 1 tablet (10 mg total) at bedtime as needed for allergies.      diclofenac sodium (VOLTAREN) 1 % gel Apply 2 g topically four (4) times a day. 100 g 6    empty container Misc Use as directed to dispose of Xolair syringes 1 each 2    EPINEPHrine (EPIPEN) 0.3 mg/0.3 mL injection Inject 0.3 mL (0.3 mg total) into the muscle once as needed for anaphylaxis (Difficulty breathing, throat closing, etc) for up to 1 dose. 1 each 12    escitalopram oxalate (LEXAPRO) 10 MG tablet 1/2 tab PO Daily for 6 days. If well tolerated, increase to 1 whole tablet PO daily. 30 tablet 1    estradiol (ESTRACE) 0.5 MG tablet Take 1 tablet (0.5 mg total) by mouth daily. 30 tablet 1    famotidine (PEPCID) 20 MG tablet Take 1 tablet (20 mg total) by mouth two (2) times a day.      gabapentin (NEURONTIN) 800 MG tablet Take 1 tablet (800 mg total) by mouth two (2) times a day. 180 tablet 0    hydroCHLOROthiazide 12.5 MG tablet TAKE 1 TABLET(12.5 MG) BY MOUTH DAILY AS NEEDED FOR SWELLING 90 tablet 1    hydroxychloroquine (PLAQUENIL) 200 mg tablet Take 1 tablet (200 mg total) by mouth two (2) times a day. 60 tablet 12    omalizumab (XOLAIR) 150 mg/mL syringe Inject 2 mL (300 mg total) under the skin every fourteen (14) days. 2 mL 11    omalizumab (XOLAIR) 75 mg/0.5 mL syringe Inject under the skin.      [START ON 10/04/2022] oxyCODONE-acetaminophen (PERCOCET) 10-325 mg per tablet Take 1 tablet by mouth every four (4) hours as needed for pain. Max 4 tabs/day. Fill on or after: 10/04/22. Brand name only due to allergy. 120 tablet 0    [START ON 11/03/2022] oxyCODONE-acetaminophen (PERCOCET) 10-325 mg per tablet Take 1 tablet by mouth every four (4) hours as needed for pain. Max 4 tabs/day. Fill on or after: 11/03/22. Brand name only due to allergy. 120 tablet 0    [START ON 12/03/2022] oxyCODONE-acetaminophen (PERCOCET) 10-325 mg per tablet Take 1 tablet by mouth every four (4) hours as needed for pain. Max  4 tabs/day. Fill on or after: 12/03/22. Brand name only due to allergy. 120 tablet 0    pantoprazole (PROTONIX) 40 MG tablet TAKE 1 TABLET(40 MG) BY MOUTH DAILY 30 MINUTES BEFORE BREAKFAST AS NEEDED 30 tablet 5    potassium chloride 20 MEQ ER tablet Take 1 tablet (20 mEq total) by mouth daily. 90 tablet 3    PROCHAMBER Spcr       rosuvastatin (CRESTOR) 10 MG tablet Take 1 tablet (10 mg total) by mouth nightly. 90 tablet 3    triamcinolone (KENALOG) 0.1 % cream Apply 1 application topically two (2) times a day as needed WHEN ALLERGIES FLARE-UP AND CAUSES RASH / ITCHING 80 g 0    ubrogepant (UBRELVY) 50 mg tablet Take 50 mg by mouth for headache. May repeat in 2 hours, if needed. LIMIT 2 PILLS PER DAY AND NO MORE THAN 4 PILLS PER WEEK. 16 tablet 3    XHANCE 93 mcg/actuation AerB 1 spray into each nostril two (2) times a day as needed. 16 mL 11     Current Facility-Administered Medications   Medication Dose Route Frequency Provider Last Rate Last Admin    omalizumab Geoffry Paradise) injection 300 mg  300 mg Subcutaneous Q28 Days Volertas, Sofija Dalia, MD   300 mg at 03/09/22 1441        Changes to medications: Nailah reports no changes at this time.    Allergies   Allergen Reactions    Buprenorphine Hcl Itching and Swelling    Pregabalin Swelling    Buprenorphine     Cephalexin Monohydrate     Doxycycline     Oxycodone Hcl     Adhesive Tape-Silicones Itching     Band-aids ok.    Doxycycline Hyclate (Bulk) Nausea And Vomiting     GI Upset    Keflex [Cephalexin] Rash    Opioids - Morphine Analogues Itching    Oxycodone-Acetaminophen Itching and Nausea And Vomiting     GI Upset- GENERIC ONLY- able to take the brand name Percocets       Changes to allergies: No    SPECIALTY MEDICATION ADHERENCE     Xolair 150 mg/ml: 0 doses of medicine on hand     Medication Adherence    Patient reported X missed doses in the last month: 0  Specialty Medication: Xolair 150 mg/mL - injects 300mg  Q14d  Patient is on additional specialty medications: No  Patient is on more than two specialty medications: No  Any gaps in refill history greater than 2 weeks in the last 3 months: no  Demonstrates understanding of importance of adherence: yes  Informant: patient          Specialty medication(s) dose(s) confirmed: Regimen is correct and unchanged.     Are there any concerns with adherence? No    Adherence counseling provided? Not needed    CLINICAL MANAGEMENT AND INTERVENTION      Clinical Benefit Assessment:    Do you feel the medicine is effective or helping your condition? Yes    Clinical Benefit counseling provided? Progress note from 8/30 shows evidence of clinical benefit    Adverse Effects Assessment:    Are you experiencing any side effects? No    Are you experiencing difficulty administering your medicine? 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 For example, brushing your teeth or getting up in the morning. Ms. Blakeney states she experiences breakthrough hives close to when next Xolair dose is due. She confirms still using zyrtec and HCQ to help reduce breakthrough hives.    Have you discussed this with your provider? Yes    Acute Infection Status:    Acute infections noted within Epic:  No active infections  Patient reported infection: None    Therapy Appropriateness:    Is therapy appropriate based on current medication list, adverse reactions, adherence, clinical benefit and progress toward achieving therapeutic goals? Yes, therapy is appropriate and should be continued     DISEASE/MEDICATION-SPECIFIC INFORMATION      For patients on injectable medications: Patient currently has 0 doses left.  Next injection is scheduled for 9/14.    Chronic Inflammatory Diseases: Have you experienced any flares in the last month? Yes, Ms. Kishi states she experiences breakthrough hives close to when next Xolair dose is due.  Has this been reported to your provider? Yes, provider added HCQ 200mg  BID at last appt on 8/30    PATIENT SPECIFIC NEEDS     Does the patient have any physical, cognitive, or cultural barriers? No    Is the patient high risk? No    Did the patient require a clinical intervention? No    Does the patient require physician intervention or other additional services (i.e., nutrition, smoking cessation, social work)? No    SOCIAL DETERMINANTS OF HEALTH     At the [facility], we have learned that life circumstances - like trouble affording food, housing, utilities, or transportation can affect the health of many of our patients.   That is why we wanted to ask: are you currently experiencing any life circumstances that are negatively impacting your health and/or quality of life? Patient declined to answer    Social Determinants of Health     Financial Resource Strain: Patient Declined (09/14/2022)    Overall Financial Resource Strain (CARDIA)     Difficulty of Paying Living Expenses: Patient declined   Recent Concern: Financial Resource Strain - Medium Risk (06/16/2022)    Overall Financial Resource Strain (CARDIA)     Difficulty of Paying Living Expenses: Somewhat hard   Internet Connectivity: Not on file   Food Insecurity: Patient Declined (09/14/2022)    Hunger Vital Sign     Worried About Running Out of Food in the Last Year: Patient declined     Ran Out of Food in the Last Year: Patient declined   Tobacco Use: Low Risk  (09/24/2022)    Patient History     Smoking Tobacco Use: Never     Smokeless Tobacco Use: Never     Passive Exposure: Past   Housing/Utilities: Patient Declined (09/14/2022)    Housing/Utilities     Within the past 12 months, have you ever stayed: outside, in a car, in a tent, in an overnight shelter, or temporarily in someone else's home (i.e. couch-surfing)?: Patient refused     Are you worried about losing your housing?: Patient refused     Within the past 12 months, have you been unable to get utilities (heat, electricity) when it was really needed?: Patient refused   Alcohol Use: Not At Risk (06/16/2022)    Alcohol Use     How often do you have a drink containing alcohol?: Never     How many drinks containing alcohol do you have on a  typical day when you are drinking?: 1 - 2     How often do you have 5 or more drinks on one occasion?: Never   Transportation Needs: Patient Declined (09/14/2022)    PRAPARE - Transportation     Lack of Transportation (Medical): Patient declined     Lack of Transportation (Non-Medical): Patient declined   Substance Use: Low Risk  (06/16/2022)    Substance Use     Taken prescription drugs for non-medical reasons: Never     Taken illegal drugs: Never     Patient indicated they have taken drugs in the past year for non-medical reasons: Yes, [positive answer(s)]: Not on file   Health Literacy: Not on file (09/14/2022)   Physical Activity: Not on file   Interpersonal Safety: Not At Risk (06/16/2022)    Interpersonal Safety     Unsafe Where You Currently Live: No     Physically Hurt by Anyone: No     Abused by Anyone: No   Stress: Stress Concern Present (06/16/2022)    [employer] of Occupational Health - Occupational Stress Questionnaire     Feeling of Stress : To some extent   Intimate Partner Violence: Patient Declined (09/14/2022)    Humiliation, Afraid, Rape, and Kick questionnaire     Fear of Current or Ex-Partner: Patient declined     Emotionally Abused: Patient declined     Physically Abused: Patient declined     Sexually Abused: Patient declined   Depression: Not at risk (09/14/2022)    PHQ-2     PHQ-2 Score: 0   Social Connections: Not on file       Would you be willing to receive help with any of the needs that you have identified today? Not applicable       SHIPPING     Specialty Medication(s) to be Shipped:   Inflammatory Disorders: Xolair    Other medication(s) to be shipped:  rosuvastatin 10mg , potassium chloride 20 mEq, azelastine nasal spray and triamcinolone 0.1% cream     Changes to insurance: No    Delivery Scheduled: Yes, Expected medication delivery date: 10/06/22.     Medication will be delivered via Same Day Courier to the confirmed prescription address in [facility].    The patient will receive a drug information handout for each medication shipped and additional FDA Medication Guides as required.  Verified that patient has previously received a Conservation officer, historic buildings and a Surveyor, mining.    The patient or caregiver noted above participated in the development of this care plan and knows that they can request review of or adjustments to the care plan at any time.      All of the patient's questions and concerns have been addressed.    Oliva Bustard, PharmD   [facility] Specialty Pharmacist

## 2022-10-03 MED ORDER — UBRELVY 50 MG TABLET
ORAL_TABLET | 3 refills | 0 days
Start: 2022-10-03 — End: ?

## 2022-10-04 MED ORDER — OXYCODONE-ACETAMINOPHEN 10 MG-325 MG TABLET
ORAL | 0 refills | 20 days | Status: CP | PRN
Start: 2022-10-04 — End: ?

## 2022-10-04 MED ORDER — UBRELVY 50 MG TABLET
3 refills | 0 days | Status: CP
Start: 2022-10-04 — End: ?

## 2022-10-06 MED FILL — POTASSIUM CHLORIDE ER 20 MEQ TABLET,EXTENDED RELEASE(PART/CRYST): ORAL | 90 days supply | Qty: 90 | Fill #1

## 2022-10-06 MED FILL — XOLAIR 150 MG/ML SUBCUTANEOUS SYRINGE: SUBCUTANEOUS | 28 days supply | Qty: 4 | Fill #4

## 2022-10-06 MED FILL — TRIAMCINOLONE ACETONIDE 0.1 % TOPICAL CREAM: 30 days supply | Qty: 80 | Fill #0

## 2022-10-06 MED FILL — ROSUVASTATIN 10 MG TABLET: ORAL | 90 days supply | Qty: 90 | Fill #1

## 2022-10-06 NOTE — Unmapped (Unsigned)
Name:  Natalie [name]  DOB: October 31, 1970  Today's Date: 10/07/2022  Age:  52 y.o.    Assessment/Plan:      Natalie [name] was seen today for follow-up.    Diagnoses and all orders for this visit:    Need for influenza vaccination  -     INFLUENZA VACCINE IIV3(IM)(PF)6 MOS UP    Cold sensitivity  -     TSH; Future  -     CBC w/ Differential; Future    Leg swelling  -     TSH; Future  -     CBC w/ Differential; Future  -     Comprehensive Metabolic Panel; Future    Urgency of urination  -     Urinalysis with Microscopy; Future      Labs to rule out causes of cold sensations, and transient leg swelling.  UA to rule out urinary infection.    Informed about prolonged sitting and medicines such as hydrochlorothiazide, Gabapentin, causing fluid retention.    She has been referred to Gyn in the past, but has not yet been seen. Provided her with contact information to GYN, to pursue appointment.    Upcoming Flu and COVID-19 vaccines recommended today.  She is due for Shingles and Pneumonia vaccines.  She is agreeable to receiving Flu and Pneumonia vaccines, and Shingles vaccine at a later date.    Diagnosis and plan along with any newly prescribed medication(s) were discussed in detail with this patient today. The patient verbalized understanding and agreed with the plan without language barriers or behavioral barriers to understanding unless otherwise noted.    No follow-ups on file.    Subjective:      HPI: Natalie [name] is a 52 y.o. female is here for    Chief Complaint   Patient presents with    Follow-up     Swollen feet     Swollen feet initially noticed x 1 month associated with pain on walking. Will try to rest most, one day out of the week. Swelling is worse in the evenings.  Takes hydrochlorothiazide 12.5 mg every 1 to 2 days.  No shortness of breath, productive cough, or difficulty breathing.    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  Avoids excess sodium and canned food. Pepsi, about once weekly.  Started on Plaquenil x 2 days ago.    She reports feeling colder these days, and is wanting her thyroid hormones to be checked.  Also notes that she has to pass urine shortly after consuming liquids.    Uses albuterol inhaler monthly, mostly on yardwork.    Past Medical/Surgical History:     Past Medical History:   Diagnosis Date    Allergic conjunctivitis 11/23/2018    Anemia 1986    Arthritis     Breast mass     Chondromalacia of left knee     Chondromalacia of right knee     Depressive disorder     Fibrocystic breast     GERD (gastroesophageal reflux disease)     Hematuria     History of kidney stones     History of transfusion     Hyperlipidemia 1999    Hypothyroidism     Lumbar disc disease 1998    She was injured at work at the age of 3 and then had disk surgery 2 years later     Menopause ovarian failure     Menopause, premature  Migraine with aura     Neuromuscular disorder (CMS-HCC) 1999    Obesity (BMI 30-39.9) 11/23/2018    Ovarian cyst     Plantar fasciitis     Pseudotumor cerebri     Rash due to allergy 11/23/2018    Sickle cell trait (CMS-HCC)     Urinary tract infection     Vaginitis      Past Surgical History:   Procedure Laterality Date    BACK SURGERY  2001    BREAST BIOPSY Bilateral     When patient was 16 & 17 Both Negative    HYSTERECTOMY  2008    PR REMOVAL OF TONSILS,13+ Y/O Bilateral 10/10/2019    Procedure: TONSILLECTOMY;  Surgeon: Lona Millard, MD;  Location: [facility];  Service: ENT    SALPINGOOPHORECTOMY Bilateral 2007    SPINE SURGERY  2001    TOTAL VAGINAL HYSTERECTOMY  01/04/2006    Uterine Prolapse-Dr. Leeanne Rio OP Note    TUBAL LIGATION  1995       Family History:     Family History   Problem Relation Age of Onset    Hypertension Mother     Heart disease Mother     Arthritis Mother     Depression Mother     Drug abuse Mother     Mental illness Mother     Ulcers Mother     COPD Mother     Heart attack Father     Mental illness Father Asthma Father     Cancer Sister         lymphoma    Heart attack Maternal Grandmother     Heart disease Maternal Grandmother     Hypertension Maternal Grandmother     Thyroid disease Maternal Grandmother     Stroke Paternal Grandfather     Depression Son     Drug abuse Son     Mental illness Son     COPD Maternal Aunt     Heart attack Cousin 77    Breast cancer Cousin 32    Thyroid disease Other     Alcohol abuse Neg Hx        Social History:     Social History     Socioeconomic History    Marital status: Single     Spouse name: None    Number of children: 2    Years of education: 15    Highest education level: None   Occupational History     Employer: NOT EMPLOYED   Tobacco Use    Smoking status: Never     Passive exposure: Past    Smokeless tobacco: Never   Vaping Use    Vaping status: Never Used   Substance and Sexual Activity    Alcohol use: Never    Drug use: Never    Sexual activity: Yes     Partners: Male     Birth control/protection: Post-menopausal, Surgical     Comment: steady partner for 4 yrs, as on 04/03/19.   Social History Narrative    Marital Status - Single     Children - Son(s) [2]    Pets - Dog (1) Turtle (1)     Household - Lives with sons and grandson Ephriam Knuckles)     Occupation - Disabled since 2002    Tobacco/Alcohol/Drug Use - Denies      Diet - Regular    Exercise/Sports - Walking     Hobbies - Movies  Education - Highest Degree Psychologist, forensic)     Country of Origin - [place]                      Social Determinants of Health     Financial Resource Strain: Low Risk  (10/07/2022)    Overall Financial Resource Strain (CARDIA)     Difficulty of Paying Living Expenses: Not hard at all   Food Insecurity: No Food Insecurity (10/07/2022)    Hunger Vital Sign     Worried About Running Out of Food in the Last Year: Never true     Ran Out of Food in the Last Year: Never true   Transportation Needs: No Transportation Needs (10/07/2022)    PRAPARE - Transportation     Lack of Transportation (Medical): No     Lack of Transportation (Non-Medical): No   Stress: Stress Concern Present (06/16/2022)    [employer] of Occupational Health - Occupational Stress Questionnaire     Feeling of Stress : To some extent       Allergies:     Buprenorphine hcl, Pregabalin, Buprenorphine, Cephalexin monohydrate, Doxycycline, Oxycodone hcl, Adhesive tape-silicones, Doxycycline hyclate (bulk), Keflex [cephalexin], Opioids - morphine analogues, and Oxycodone-acetaminophen    Current Medications:     Current Outpatient Medications   Medication Sig Dispense Refill    albuterol HFA 90 mcg/actuation inhaler Inhale 2 puffs every six (6) hours as needed for wheezing. 54 g 0    albuterol HFA 90 mcg/actuation inhaler Inhale 2 puffs every six (6) hours as needed for wheezing or shortness of breath. 54 g 0    azelastine (ASTELIN) 137 mcg (0.1 %) nasal spray 2 sprays into each nostril two (2) times a day. 30 mL 0    azelastine (OPTIVAR) 0.05 % ophthalmic solution Administer 2 drops to both eyes daily as needed. 6 mL 5    baclofen (LIORESAL) 20 MG tablet 10 - 20 mg three times a day as needed for spasms. 90 tablet 2    cetirizine (ZYRTEC) 10 MG chewable tablet Chew 1 tablet (10 mg total) at bedtime as needed for allergies.      diclofenac sodium (VOLTAREN) 1 % gel Apply 2 g topically four (4) times a day. 100 g 6    empty container Misc Use as directed to dispose of Xolair syringes 1 each 2    EPINEPHrine (EPIPEN) 0.3 mg/0.3 mL injection Inject 0.3 mL (0.3 mg total) into the muscle once as needed for anaphylaxis (Difficulty breathing, throat closing, etc) for up to 1 dose. 1 each 12    escitalopram oxalate (LEXAPRO) 10 MG tablet 1/2 tab PO Daily for 6 days. If well tolerated, increase to 1 whole tablet PO daily. 30 tablet 1    estradiol (ESTRACE) 0.5 MG tablet Take 1 tablet (0.5 mg total) by mouth daily. 30 tablet 1    famotidine (PEPCID) 20 MG tablet Take 1 tablet (20 mg total) by mouth two (2) times a day.      gabapentin (NEURONTIN) 800 MG tablet Take 1 tablet (800 mg total) by mouth two (2) times a day. 180 tablet 0    hydroCHLOROthiazide 12.5 MG tablet TAKE 1 TABLET(12.5 MG) BY MOUTH DAILY AS NEEDED FOR SWELLING 90 tablet 1    hydroxychloroquine (PLAQUENIL) 200 mg tablet Take 1 tablet (200 mg total) by mouth two (2) times a day. 60 tablet 12    omalizumab (XOLAIR) 150 mg/mL syringe Inject 2 mL (300 mg total) under the  skin every fourteen (14) days. 2 mL 11    omalizumab (XOLAIR) 75 mg/0.5 mL syringe Inject under the skin.      oxyCODONE-acetaminophen (PERCOCET) 10-325 mg per tablet Take 1 tablet by mouth every four (4) hours as needed for pain. Max 4 tabs/day. Fill on or after: 10/04/22. Brand name only due to allergy. 120 tablet 0    [START ON 11/03/2022] oxyCODONE-acetaminophen (PERCOCET) 10-325 mg per tablet Take 1 tablet by mouth every four (4) hours as needed for pain. Max 4 tabs/day. Fill on or after: 11/03/22. Brand name only due to allergy. 120 tablet 0    [START ON 12/03/2022] oxyCODONE-acetaminophen (PERCOCET) 10-325 mg per tablet Take 1 tablet by mouth every four (4) hours as needed for pain. Max 4 tabs/day. Fill on or after: 12/03/22. Brand name only due to allergy. 120 tablet 0    pantoprazole (PROTONIX) 40 MG tablet TAKE 1 TABLET(40 MG) BY MOUTH DAILY 30 MINUTES BEFORE BREAKFAST AS NEEDED 30 tablet 5    potassium chloride 20 MEQ ER tablet Take 1 tablet (20 mEq total) by mouth daily. 90 tablet 3    PROCHAMBER Spcr       rosuvastatin (CRESTOR) 10 MG tablet Take 1 tablet (10 mg total) by mouth nightly. 90 tablet 3    triamcinolone (KENALOG) 0.1 % cream Apply topically two (2) times a day as needed WHEN ALLERGIES FLARE-UP AND CAUSES RASH / ITCHING 80 g 0    ubrogepant (UBRELVY) 50 mg tablet TAKE 1 TABLET BY MOUTH FOR HEADACHE. MAY REPEAT IN 2 HOURS IF NEEDED. LIMIT 2 TABLETS PER DAY AND NO MORE THAN 4 TABLETS PER WEEK 16 tablet 3    XHANCE 93 mcg/actuation AerB 1 spray into each nostril two (2) times a day as needed. 16 mL 11    albuterol HFA 90 mcg/actuation inhaler Inhale 2 puffs every four (4) hours as needed for wheezing. 54 g 0     Current Facility-Administered Medications   Medication Dose Route Frequency Provider Last Rate Last Admin    omalizumab (XOLAIR) injection 300 mg  300 mg Subcutaneous Q28 Days Volertas, Sofija Dalia, MD   300 mg at 03/09/22 1441       ROS:     All ROS negative unless otherwise noted in HPI.    Vital Signs:     Wt Readings from Last 3 Encounters:   10/07/22 72.7 kg (160 lb 4.8 oz)   09/16/22 74.7 kg (164 lb 9.6 oz)   08/25/22 72.2 kg (159 lb 3.2 oz)     Temp Readings from Last 3 Encounters:   10/07/22 36.3 ??C (97.4 ??F) (Temporal)   09/16/22 36.6 ??C (97.9 ??F) (Skin)   08/31/22 36.3 ??C (97.3 ??F) (Skin)     BP Readings from Last 3 Encounters:   10/07/22 116/74   09/16/22 122/78   08/31/22 116/70     Pulse Readings from Last 3 Encounters:   10/07/22 81   09/16/22 93   08/31/22 86     Body mass index is 30.29 kg/m??.    Objective:      Physical Exam  Vitals and nursing note reviewed.   Constitutional:       General: She is not in acute distress.     Appearance: She is well-developed. She is not ill-appearing or diaphoretic.   Eyes:      General: No scleral icterus.     Conjunctiva/sclera: Conjunctivae normal.   Neck:      Thyroid: No thyroid mass, thyromegaly  or thyroid tenderness.      Vascular: No carotid bruit or JVD.      Comments: No thyromegaly, no nodules or masses felt on exam.  Cardiovascular:      Rate and Rhythm: Normal rate and regular rhythm.      Heart sounds: Normal heart sounds. No murmur heard.     Comments: Trace edema, non pitting type in bilateral feet.  Slight swelling observed posterior to the left lateral malleolus.  Dorsal pedis pulses normal in the feet.  No varicose veins observed.    Pulmonary:      Effort: Pulmonary effort is normal. No respiratory distress.      Breath sounds: Normal breath sounds. No wheezing.   Musculoskeletal:      Right lower leg: No edema.      Left lower leg: No edema. Right foot: Normal range of motion.      Left foot: Normal range of motion.   Feet:      Comments: Non-tender on ROM  Lymphadenopathy:      Cervical: No cervical adenopathy.   Neurological:      Mental Status: She is alert and oriented to person, place, and time.      Cranial Nerves: Cranial nerves 2-12 are intact. No cranial nerve deficit.   Psychiatric:         Mood and Affect: Mood normal.         Behavior: Behavior normal.         Thought Content: Thought content normal.         Judgment: Judgment normal.           Labs:     No results found for this visit on 10/07/22.    I attest that I, Rayetta Humphrey, personally documented this note while acting as scribe for Royal Hawthorn, MD.      Rayetta Humphrey, Scribe.  10/07/2022     The documentation recorded by the scribe accurately reflects the service I personally performed and the decisions made by me.    Royal Hawthorn, MD

## 2022-10-07 ENCOUNTER — Ambulatory Visit: Admit: 2022-10-07 | Discharge: 2022-10-07 | Payer: MEDICARE

## 2022-10-07 DIAGNOSIS — E0590 Thyrotoxicosis, unspecified without thyrotoxic crisis or storm: Principal | ICD-10-CM

## 2022-10-07 DIAGNOSIS — Z23 Encounter for immunization: Principal | ICD-10-CM

## 2022-10-07 DIAGNOSIS — R3129 Other microscopic hematuria: Principal | ICD-10-CM

## 2022-10-07 DIAGNOSIS — R6889 Other general symptoms and signs: Principal | ICD-10-CM

## 2022-10-07 DIAGNOSIS — R3915 Urgency of urination: Principal | ICD-10-CM

## 2022-10-07 DIAGNOSIS — M7989 Other specified soft tissue disorders: Principal | ICD-10-CM

## 2022-10-07 LAB — CBC W/ AUTO DIFF
BASOPHILS ABSOLUTE COUNT: 0.1 10*9/L (ref 0.0–0.1)
BASOPHILS RELATIVE PERCENT: 1.1 %
EOSINOPHILS ABSOLUTE COUNT: 0.1 10*9/L (ref 0.0–0.5)
EOSINOPHILS RELATIVE PERCENT: 2.5 %
HEMATOCRIT: 39.2 % (ref 34.0–44.0)
HEMOGLOBIN: 13 g/dL (ref 11.3–14.9)
LYMPHOCYTES ABSOLUTE COUNT: 1.6 10*9/L (ref 1.1–3.6)
LYMPHOCYTES RELATIVE PERCENT: 28.8 %
MEAN CORPUSCULAR HEMOGLOBIN CONC: 33.1 g/dL (ref 32.0–36.0)
MEAN CORPUSCULAR HEMOGLOBIN: 30.4 pg (ref 25.9–32.4)
MEAN CORPUSCULAR VOLUME: 92 fL (ref 77.6–95.7)
MEAN PLATELET VOLUME: 11.7 fL — ABNORMAL HIGH (ref 6.8–10.7)
MONOCYTES ABSOLUTE COUNT: 0.5 10*9/L (ref 0.3–0.8)
MONOCYTES RELATIVE PERCENT: 8.9 %
NEUTROPHILS ABSOLUTE COUNT: 3.3 10*9/L (ref 1.8–7.8)
NEUTROPHILS RELATIVE PERCENT: 58.7 %
PLATELET COUNT: 211 10*9/L (ref 150–450)
RED BLOOD CELL COUNT: 4.26 10*12/L (ref 3.95–5.13)
RED CELL DISTRIBUTION WIDTH: 13.3 % (ref 12.2–15.2)
WBC ADJUSTED: 5.7 10*9/L (ref 3.6–11.2)

## 2022-10-07 LAB — URINALYSIS WITH MICROSCOPY
BILIRUBIN UA: NEGATIVE
GLUCOSE UA: NEGATIVE
HYPHAL YEAST: NONE SEEN /HPF
KETONES UA: NEGATIVE
LEUKOCYTE ESTERASE UA: NEGATIVE
NITRITE UA: NEGATIVE
PH UA: 8 (ref 5.0–9.0)
PROTEIN UA: NEGATIVE
RBC UA: 1 /HPF (ref 0–3)
SPECIFIC GRAVITY UA: 1.015 (ref 1.005–1.030)
SQUAMOUS EPITHELIAL: 1 /HPF (ref 0–5)
UROBILINOGEN UA: <2
WBC CLUMPS: NONE SEEN /HPF
WBC UA: 1 /HPF (ref 0–2)
YEAST: NONE SEEN /HPF

## 2022-10-07 LAB — COMPREHENSIVE METABOLIC PANEL
ALBUMIN: 4.2 g/dL (ref 3.4–5.0)
ALKALINE PHOSPHATASE: 60 U/L (ref 46–116)
ALT (SGPT): 23 U/L (ref 10–49)
ANION GAP: 4 mmol/L (ref 3–11)
AST (SGOT): 36 U/L — ABNORMAL HIGH (ref <34)
BILIRUBIN TOTAL: 0.4 mg/dL (ref 0.3–1.2)
BLOOD UREA NITROGEN: 13 mg/dL (ref 9–23)
BUN / CREAT RATIO: 17
CALCIUM: 9.7 mg/dL (ref 8.7–10.4)
CHLORIDE: 104 mmol/L (ref 98–107)
CO2: 34 mmol/L — ABNORMAL HIGH (ref 20.0–31.0)
CREATININE: 0.75 mg/dL (ref 0.55–1.02)
EGFR CKD-EPI (2021) FEMALE: >90 mL/min/{1.73_m2} (ref >=60)
GLUCOSE RANDOM: 102 mg/dL — ABNORMAL HIGH (ref 70–99)
POTASSIUM: 4.2 mmol/L (ref 3.5–5.1)
PROTEIN TOTAL: 7.7 g/dL (ref 5.7–8.2)
SODIUM: 142 mmol/L (ref 136–145)

## 2022-10-07 LAB — T4, FREE: FREE T4: 1.16 ng/dL (ref 0.89–1.76)

## 2022-10-07 LAB — TSH: THYROID STIMULATING HORMONE: 0.296 u[IU]/mL — ABNORMAL LOW (ref 0.550–4.780)

## 2022-10-07 NOTE — Unmapped (Cosign Needed)
Swelling in both feet; steadily swelling for the last couple of months.    Requesting thyroid re-check; patient feeling cold frequently.

## 2022-10-14 ENCOUNTER — Ambulatory Visit: Admit: 2022-10-14 | Payer: MEDICARE

## 2022-10-22 ENCOUNTER — Telehealth: Admit: 2022-10-22 | Discharge: 2022-10-23 | Payer: MEDICARE | Attending: Psychologist | Primary: Psychologist

## 2022-10-22 DIAGNOSIS — F419 Anxiety disorder, unspecified: Principal | ICD-10-CM

## 2022-10-22 DIAGNOSIS — F4321 Adjustment disorder with depressed mood: Principal | ICD-10-CM

## 2022-10-22 DIAGNOSIS — F1190 Opioid use, unspecified, uncomplicated: Principal | ICD-10-CM

## 2022-10-22 NOTE — Unmapped (Signed)
[facility]   Confidential Psychological Therapy Session    Patient Name: Natalie [name]  Medical Record Number: [id number]  Date of Service: October 22, 2022  Attending Psychologist: Caroline More, PhD  CPT Procedure Code: 81191 for 45 minutes of face to face counseling    This visit was performed face to face with interactive technology using a HIPPA compliant audio/visual platform. We reviewed confidentiality today. The patient was present in [place], a state in which this provider is licensed and able to provide care (location and contact information confirmed), attended this visit alone, and consented to this virtual pain psychology visit.    REFERRING PHYSICIAN: Criss Rosales, MD    CHIEF COMPLAINT AND REASON FOR VISIT:  COM follow up evaluation/pain coping skills; CBT/ACT for pain; grief    SUBJECTIVE / HISTORY OF PRESENT ILLNESS: Natalie [name] is a very pleasant 52 y.o.  female with chronic lower back pain, chronic headaches, and myofascial pain. Her pain started in 1999 in her lower back.  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  She had a spinal fusion in 2001 at the L5-S1 level.  She has had bilateral shoulder pain, neck pain, lower back pain, and b/l knees.  She also described a radiculopathy b/l that goes from her back to her bilateral buttocks and down to her toes.      Pt initially established with Dr. Oda Kilts in 08/2016 and then was was first evaluated by me in 07/2017. Last follow up with me was 09/24/2022. Patient alerted me to a schedule change today which resulted in her not being able to meet for our entire session. She apologized. We focused on adherence check in and she reports good compliance and continued understanding that compliance MUST be followed for her opioid contract. Continues to teach water aerobics. Enjoys this and keeps her active. Using pacing and ARC and not overdoing it. Commended and discussed how these skills continue to serve her well. Pt notes psychosocial stressors (there have been many in the last 1-2 years) are improving and discusses how much better it feels given this. Reflected on her learning that stressors ebb and flow and importance of cogn reframing in the moment as the most stressful moments change and do not last forever. Highlighted problem solving related to controllable stressors and how that helps with autonomy and control. Highlighted acceptance with uncontrollable stressors and how that helps, including sitting with how that feels versus pushing it away. Connected grief to acceptance of loss.     Reports good adherence to COM (Percocet). UTS UTD (most recent 01/11/22 was appropriate).      OBJECTIVE / MENTAL STATUS:    Appearance:   Appears stated age and Clean/Neat   Motor:  No abnormal movements   Speech/Language:   Normal rate, volume, tone, fluency   Mood:  Irritable   Affect:  Blunted   Thought process:  Logical, linear, clear, coherent, goal directed   Thought content:    Denies SI, HI, self harm, delusions, obsessions, paranoid ideation, or ideas of reference   Perceptual disturbances:    Denies auditory and visual hallucinations, behavior not concerning for response to internal stimuli   Orientation:  Oriented to person, place, time, and general circumstances   Attention:  Able to fully attend without fluctuations in consciousness   Concentration:  Able to fully concentrate and attend   Memory:  Immediate, short-term, long-term, and recall grossly intact  Fund of knowledge:   Consistent with level of education and development   Insight:    Fair   Judgment:   Intact   Impulse Control:  Intact       DIAGNOSTIC IMPRESSION:   Anxiety NOS  Chronic continuou use of opiates  Grief    ASSESSMENT:   Natalie [name] is a very pleasant 52 y.o.  female from [place], [place] with chronic lower back pain, chronic headaches, and myofascial pain. Her pain started in 1999 in her lower back.  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  She had a spinal fusion in 2001 at the L5-S1 level.  She has had bilateral shoulder pain, neck pain, lower back pain, and b/l knees.  She also described a radiculopathy b/l that goes from her back to her bilateral buttocks and down to her toes.  The patient is currently considered to be high risk due to prior nonadherence, but appropriate with a behavioral adherence plan in place.  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 She continues to participate actively in psychotherapy.    PLAN:   (1) COM - high risk but remains appropriate with a behavioral adherence plan also in place.    -Patient MUST meet with pain psychology/me once a month.   -No additional overuse of opioids will be tolerated.    -Patient should always bring her medication to clinic for pill counts.  -Patient was informed she has had numerous infractions with respect to her pain contract over the years, and that no further infractions will be tolerated.    If any additional pain contract or behavioral adherence contract infractions occur, I recommend stopping opioids.    Pt doing well with adherence, per our last few visits.     (2) Pain coping skills- addressed compliance, as well as pacing and mindful breathing, body scan, and problem solving    (3) follow-in 1 month

## 2022-10-25 ENCOUNTER — Ambulatory Visit
Admit: 2022-10-25 | Discharge: 2022-10-26 | Payer: MEDICARE | Attending: Obstetrics & Gynecology | Primary: Obstetrics & Gynecology

## 2022-10-25 MED ORDER — ESTRADIOL 1 MG TABLET
ORAL_TABLET | Freq: Every day | ORAL | 3 refills | 90.00000 days | Status: CP
Start: 2022-10-25 — End: 2023-10-25

## 2022-10-25 MED ORDER — PROGESTERONE MICRONIZED 100 MG CAPSULE
ORAL_CAPSULE | Freq: Every evening | ORAL | 3 refills | 90.00000 days | Status: CP
Start: 2022-10-25 — End: 2023-10-25

## 2022-10-25 NOTE — Unmapped (Signed)
hotOutpatient Gynecology:  New Patient Problem-Based Visit    ASSESSMENT AND PLAN     Natalie [name] is a 52 y.o. 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 Elects to continue pills at this time  - Reviewed non-hormonal options for management of her symptoms.   - Given she is currently on low dose estrogen with room to increase before trialing non-hormonal methods, will trial 1mg  daily. May also find improvement in her atrophy symptoms. Discussed reaching out in 56mo to report symptoms for possible up-titration.  - Significant sleep disturbance may be helped with initiation of nightly progesterone supplementation, rx provided, patient to decide if she would like to try.    - Follow up in 2-84mo      Health Maintenance:  Pap smear: s/p hyst, no prior dysplasia  Mammogram: per PCP  Colonoscopy: per PCP  Exercise: Water aerobics instructor  PCP: Neelariri.    Return in about 3 months (around 01/24/2023).    Dr. Algernon Huxley was available for the care of this patient.    HISTORY     Chief Complaint:   Chief Complaint   Patient presents with    Menopause       HPI:  Natalie [name] presents for management of menopausal symptoms.    History of vaginal hysterectomy (retained ovaries on review of op note - contrary to multiple notes reporting oophorectomy at the time of hyst) for prolapse and AUB in 2008 with [facility]. Reports developing hot flashes year later - has been intermittently on hormone replacement since then, primarily managed by her PCP. Cessation driven by desire for medication holiday. Has been on estrace, progesterone for VMS as well as topical estradiol and vagifem for atrophic vaginitis. Reports that they did not help much with her symptoms.    Most recently has restarted 0.5mg  of estrace daily with her PCP, but is not having sufficient improvement in her VMS and subsequent sleep disturbance. Her goal for today's visit is to better manage her VMS. 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    Also on gabapentin for LE neuropathy, which she plans on also discontinuing for a drug holiday - did not note improvement in her VMS with intiation. Curious about escitalopram for management.    Regularly follows with her PCP. No known medical contraindications to HRT.      06/2022 PCP visit  She complains of having some vaginal discharge, whitish / clear.  Vaginal itching and pain. No vaginal odor.  Apparently, her vaginal symptoms resemble previous Bacterial Vaginosis episodes.     She also has been having recurrent hot flashes and sweats: randomly.  She has had menopause, s/p Hysterectomy, and used to be on estrogen supplements.  She has been out of estrogen hormones. Has been more since  then. HRT was helpful, and she requests a refill.  She also has Vaginal dryness.    Hyst 2008, prolapse and AUB    OB History:   OB History   Gravida Para Term Preterm AB Living   2 2 2  0 0 2   SAB IAB Ectopic Molar Multiple  Live Births   0 0 0   0 1      # Outcome Date GA Lbr Len/2nd Weight Sex Type Anes PTL Lv   2 Term 1995 [redacted]w[redacted]d  3090 g (6 lb 13 oz) M Vag-Spont      1 Term 1992 [redacted]w[redacted]d  3260 g (7 lb 3 oz) M Vag-Spont   LIV      Obstetric Comments   Last Pap-07/2022   Abnormal Pap-No   STI-Yes       GYN History:   LMP: No LMP recorded. Patient has had a hysterectomy.  Pap Smear History: s/p hyst  Sexual History: Active  Menopausal since: (Hyst in 2008 - VMS starting 2009)    Past Medical History:  Past Medical History:   Diagnosis Date    Allergic conjunctivitis 11/23/2018    Anemia 1986    Arthritis     Breast mass     Chondromalacia of left knee Chondromalacia of right knee     Depressive disorder     Fibrocystic breast     GERD (gastroesophageal reflux disease)     Hematuria     History of kidney stones     History of transfusion     Hyperlipidemia 1999    Hypothyroidism     Lumbar disc disease 1998    She was injured at work at the age of 52 and then had disk surgery 2 years later     Menopause ovarian failure     Menopause, premature     Migraine with aura     Neuromuscular disorder (CMS-HCC) 1999    Obesity (BMI 30-39.9) 11/23/2018    Ovarian cyst     Plantar fasciitis     Pseudotumor cerebri     Rash due to allergy 11/23/2018    Sickle cell trait (CMS-HCC)     Urinary tract infection     Vaginitis        Past Surgical History:  Past Surgical History:   Procedure Laterality Date    BACK SURGERY  2001    BREAST BIOPSY Bilateral     When patient was 16 & 17 Both Negative    HYSTERECTOMY  2008    PR REMOVAL OF TONSILS,12+ Y/O Bilateral 10/10/2019    Procedure: TONSILLECTOMY;  Surgeon: Lona Millard, MD;  Location: OR [place];  Service: ENT    SALPINGOOPHORECTOMY Bilateral 2007    SPINE SURGERY  2001    TOTAL VAGINAL HYSTERECTOMY  01/04/2006    Uterine Prolapse-Dr. Leeanne Rio OP Note    TUBAL LIGATION  1995       Family History:  Family History   Problem Relation Age of Onset    Hypertension Mother     Heart disease Mother     Arthritis Mother     Depression Mother     Drug abuse Mother     Mental illness Mother     Ulcers Mother     COPD Mother     Heart attack Father     Mental illness Father     Asthma Father     Cancer Sister         lymphoma    Heart attack Maternal Grandmother     Heart disease Maternal Grandmother     Hypertension Maternal Grandmother     Thyroid disease Maternal Grandmother     Stroke Paternal Grandfather     Depression Son     Drug abuse Son     Mental  illness Son     COPD Maternal Aunt     Heart attack Cousin 51    Breast cancer Cousin 32    Thyroid disease Other     Alcohol abuse Neg Hx        Social History:  Social History     Tobacco Use    Smoking status: Never     Passive exposure: Past    Smokeless tobacco: Never   Vaping Use    Vaping status: Never Used   Substance Use Topics    Alcohol use: Never    Drug use: Never       Medications:    Current Outpatient Medications:     albuterol HFA 90 mcg/actuation inhaler, Inhale 2 puffs every six (6) hours as needed for wheezing., Disp: 54 g, Rfl: 0    albuterol HFA 90 mcg/actuation inhaler, Inhale 2 puffs every six (6) hours as needed for wheezing or shortness of breath., Disp: 54 g, Rfl: 0    azelastine (ASTELIN) 137 mcg (0.1 %) nasal spray, 2 sprays into each nostril two (2) times a day., Disp: 30 mL, Rfl: 0    azelastine (OPTIVAR) 0.05 % ophthalmic solution, Administer 2 drops to both eyes daily as needed., Disp: 6 mL, Rfl: 5    baclofen (LIORESAL) 20 MG tablet, 10 - 20 mg three times a day as needed for spasms., Disp: 90 tablet, Rfl: 2    cetirizine (ZYRTEC) 10 MG chewable tablet, Chew 1 tablet (10 mg total) at bedtime as needed for allergies., Disp: , Rfl:     diclofenac sodium (VOLTAREN) 1 % gel, Apply 2 g topically four (4) times a day., Disp: 100 g, Rfl: 6    empty container Misc, Use as directed to dispose of Xolair syringes, Disp: 1 each, Rfl: 2    EPINEPHrine (EPIPEN) 0.3 mg/0.3 mL injection, Inject 0.3 mL (0.3 mg total) into the muscle once as needed for anaphylaxis (Difficulty breathing, throat closing, etc) for up to 1 dose., Disp: 1 each, Rfl: 12    escitalopram oxalate (LEXAPRO) 10 MG tablet, 1/2 tab PO Daily for 6 days. If well tolerated, increase to 1 whole tablet PO daily., Disp: 30 tablet, Rfl: 1    famotidine (PEPCID) 20 MG tablet, Take 1 tablet (20 mg total) by mouth two (2) times a day., Disp: , Rfl:     gabapentin (NEURONTIN) 800 MG tablet, Take 1 tablet (800 mg total) by mouth two (2) times a day., Disp: 180 tablet, Rfl: 0    hydroCHLOROthiazide 12.5 MG tablet, TAKE 1 TABLET(12.5 MG) BY MOUTH DAILY AS NEEDED FOR SWELLING, Disp: 90 tablet, Rfl: 1 hydroxychloroquine (PLAQUENIL) 200 mg tablet, Take 1 tablet (200 mg total) by mouth two (2) times a day., Disp: 60 tablet, Rfl: 12    omalizumab (XOLAIR) 150 mg/mL syringe, Inject 2 mL (300 mg total) under the skin every fourteen (14) days., Disp: 2 mL, Rfl: 11    omalizumab (XOLAIR) 75 mg/0.5 mL syringe, Inject under the skin., Disp: , Rfl:     oxyCODONE-acetaminophen (PERCOCET) 10-325 mg per tablet, Take 1 tablet by mouth every four (4) hours as needed for pain. Max 4 tabs/day. Fill on or after: 10/04/22. Brand name only due to allergy., Disp: 120 tablet, Rfl: 0    [START ON 11/03/2022] oxyCODONE-acetaminophen (PERCOCET) 10-325 mg per tablet, Take 1 tablet by mouth every four (4) hours as needed for pain. Max 4 tabs/day. Fill on or after: 11/03/22. Brand name only due to allergy., Disp:  120 tablet, Rfl: 0    [START ON 12/03/2022] oxyCODONE-acetaminophen (PERCOCET) 10-325 mg per tablet, Take 1 tablet by mouth every four (4) hours as needed for pain. Max 4 tabs/day. Fill on or after: 12/03/22. Brand name only due to allergy., Disp: 120 tablet, Rfl: 0    pantoprazole (PROTONIX) 40 MG tablet, TAKE 1 TABLET(40 MG) BY MOUTH DAILY 30 MINUTES BEFORE BREAKFAST AS NEEDED, Disp: 30 tablet, Rfl: 5    potassium chloride 20 MEQ ER tablet, Take 1 tablet (20 mEq total) by mouth daily., Disp: 90 tablet, Rfl: 3    PROCHAMBER Spcr, , Disp: , Rfl:     rosuvastatin (CRESTOR) 10 MG tablet, Take 1 tablet (10 mg total) by mouth nightly., Disp: 90 tablet, Rfl: 3    triamcinolone (KENALOG) 0.1 % cream, Apply topically two (2) times a day as needed WHEN ALLERGIES FLARE-UP AND CAUSES RASH / ITCHING, Disp: 80 g, Rfl: 0    ubrogepant (UBRELVY) 50 mg tablet, TAKE 1 TABLET BY MOUTH FOR HEADACHE. MAY REPEAT IN 2 HOURS IF NEEDED. LIMIT 2 TABLETS PER DAY AND NO MORE THAN 4 TABLETS PER WEEK, Disp: 16 tablet, Rfl: 3    XHANCE 93 mcg/actuation AerB, 1 spray into each nostril two (2) times a day as needed., Disp: 16 mL, Rfl: 11    albuterol HFA 90 mcg/actuation inhaler, Inhale 2 puffs every four (4) hours as needed for wheezing., Disp: 54 g, Rfl: 0    estradiol (ESTRACE) 1 MG tablet, Take 1 tablet (1 mg total) by mouth daily., Disp: 90 tablet, Rfl: 3    progesterone (PROMETRIUM) 100 MG capsule, Take 1 capsule (100 mg total) by mouth nightly., Disp: 90 capsule, Rfl: 3    Current Facility-Administered Medications:     omalizumab Geoffry Paradise) injection 300 mg, 300 mg, Subcutaneous, Q28 Days, Volertas, Sofija Dalia, MD, 300 mg at 03/09/22 1441    Allergies:  Buprenorphine hcl, Pregabalin, Buprenorphine, Cephalexin monohydrate, Doxycycline, Oxycodone hcl, Adhesive tape-silicones, Doxycycline hyclate (bulk), Keflex [cephalexin], Opioids - morphine analogues, and Oxycodone-acetaminophen    PHYSICAL EXAM   BP 136/84 (BP Site: L Arm, BP Position: Sitting, BP Cuff Size: Large)  - Pulse 65  - Wt 72.5 kg (159 lb 12.8 oz)  - BMI 30.19 kg/m??     Constitutional: No distress.   Pulmonary/Chest: Normal work of breathing.   Neurological: She is alert and oriented to person, place, and time.   Skin: Warm, dry, no rash  Psychiatric: AAO x 3, normal mood and affect    LABS AND IMAGING     Lab Results   Component Value Date    WBC 5.7 10/07/2022    HGB 13.0 10/07/2022    HCT 39.2 10/07/2022    PLT 211 10/07/2022       Lab Results   Component Value Date    NA 142 10/07/2022    K 4.2 10/07/2022    CL 104 10/07/2022    CO2 34.0 (H) 10/07/2022    BUN 13 10/07/2022    CREATININE 0.75 10/07/2022    GLU 102 (H) 10/07/2022    CALCIUM 9.7 10/07/2022       Mammo Digital Screening W Tomo Bilateral W CAD    Result Date: 07/07/2022  EXAM:  BILATERAL SCREENING 3-D MAMMOGRAM WITH CAD  HISTORY:  Screening.  TECHNIQUE:  Bilateral digital screening 2-D and 3-D mammogram with CAD (Computer-Aided Detection).  COMPARISON:  02/23/21 and breast imaging studies dating back to 07/19/16  BREAST DENSITY:  c - The breast parenchyma  is heterogeneously dense, which may obscure small masses.  FINDINGS:  There are no findings suggestive of malignancy.      There is no mammographic evidence of malignancy.  FINAL ASSESSMENT:  BIRADS 1:  Negative.  RECOMMENDATIONS:  The patient's next mammogram exam should be in one year.  Notes:     --Mammography does not detect all cancers.  Any suspicious palpable mass should be evaluated further clinically. --The patient has been entered into a reminder system with a target due date for the next mammogram. --Letter sent to the patient.  Signed (Electronic Signature): 07/07/2022 4:49 PM Signed By: Francesca Oman, MD

## 2022-10-26 ENCOUNTER — Telehealth: Admit: 2022-10-26 | Discharge: 2022-10-27 | Payer: MEDICARE | Attending: Psychologist | Primary: Psychologist

## 2022-10-26 DIAGNOSIS — F419 Anxiety disorder, unspecified: Principal | ICD-10-CM

## 2022-10-26 DIAGNOSIS — F4321 Adjustment disorder with depressed mood: Principal | ICD-10-CM

## 2022-10-26 DIAGNOSIS — F1190 Opioid use, unspecified, uncomplicated: Principal | ICD-10-CM

## 2022-10-26 NOTE — Unmapped (Signed)
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 Elects to continue pills at this time  - Reviewed non-hormonal options for management of her symptoms.   - Given she is currently on low dose estrogen with room to increase before trialing non-hormonal methods, will trial 1mg  daily. May also find improvement in her atrophy symptoms. Discussed reaching out in 1mo to report symptoms for possible up-titration.  - Significant sleep disturbance may be helped with initiation of nightly progesterone supplementation, rx provided, patient to decide if she would like to try.    - Follow up in 2-28mo

## 2022-10-26 NOTE — Unmapped (Signed)
[facility]   Confidential Psychological Therapy Session    Patient Name: Natalie [name]  Medical Record Number: [id number]  Date of Service: October 26, 2022  Attending Psychologist: Caroline More, PhD  CPT Procedure Code: 81191 for 45 minutes of face to face counseling    This visit was performed face to face with interactive technology using a HIPPA compliant audio/visual platform. We reviewed confidentiality today. The patient was present in [place], a state in which this provider is licensed and able to provide care (location and contact information confirmed), attended this visit alone, and consented to this virtual pain psychology visit.    REFERRING PHYSICIAN: Criss Rosales, MD    CHIEF COMPLAINT AND REASON FOR VISIT:  COM follow up evaluation/pain coping skills; CBT/ACT for pain; grief    SUBJECTIVE / HISTORY OF PRESENT ILLNESS: [name] is a very pleasant 52 y.o.  female with chronic lower back pain, chronic headaches, and myofascial pain. Her pain started in 1999 in her lower back.  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  She had a spinal fusion in 2001 at the L5-S1 level.  She has had bilateral shoulder pain, neck pain, lower back pain, and b/l knees.  She also described a radiculopathy b/l that goes from her back to her bilateral buttocks and down to her toes.      Pt initially established with Dr. Oda [name] in 08/2016 and then was was first evaluated by me in 07/2017. Last follow up with me was 10/22/2022.  Patient shares a number of positive health behavior changes.  She is now working out 5 days a week, building strength and endurance.  She also is working, teaching classes at the exercise pool, and training patients individually.  She discusses how she is actually gaining weight but losing body mass, and she describes feeling stronger and building [name] muscle.  She notes others have commented on her physical appearance and this has been an added benefit to confidence.  Patient processes thoughts and feelings related to her son and his housing predicament.  Her car recently started having problems and she problem solved through getting a new car.  She describes some obstacles and how she was able to utilize her support system and make a thoughtful decisions so that she now has a new car that she expects to last a long time.    Patient reports good adherence to COM (Percocet). UTS UTD (most recent 01/11/22 was appropriate).      OBJECTIVE / MENTAL STATUS:    Appearance:   Appears stated age and Clean/Neat   Motor:  No abnormal movements   Speech/Language:   Normal rate, volume, tone, fluency   Mood:  Irritable   Affect:  Blunted   Thought process:  Logical, linear, clear, coherent, goal directed   Thought content:    Denies SI, HI, self harm, delusions, obsessions, paranoid ideation, or ideas of reference   Perceptual disturbances:    Denies auditory and visual hallucinations, behavior not concerning for response to internal stimuli   Orientation:  Oriented to person, place, time, and general circumstances   Attention:  Able to fully attend without fluctuations in consciousness   Concentration:  Able to fully concentrate and attend   Memory:  Immediate, short-term, long-term, and recall grossly intact    Fund of knowledge:   Consistent with level of education and development   Insight:    Fair  Judgment:   Intact   Impulse Control:  Intact       DIAGNOSTIC IMPRESSION:   Anxiety NOS  Chronic continuou use of opiates  Grief    ASSESSMENT:   [name] is a very pleasant 52 y.o.  female from [place], [place] with chronic lower back pain, chronic headaches, and myofascial pain. Her pain started in 1999 in her lower back.  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  She had a spinal fusion in 2001 at the L5-S1 level.  She has had bilateral shoulder pain, neck pain, lower back pain, and b/l knees.  She also described a radiculopathy b/l that goes from her back to her bilateral buttocks and down to her toes.  The patient is currently considered to be high risk due to prior nonadherence, but appropriate with a behavioral adherence plan in place.  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 She continues to participate actively in psychotherapy.    PLAN:   (1) COM - high risk but remains appropriate with a behavioral adherence plan also in place.    -Patient MUST meet with pain psychology/me once a month.   -No additional overuse of opioids will be tolerated.    -Patient should always bring her medication to clinic for pill counts.  -Patient was informed she has had numerous infractions with respect to her pain contract over the years, and that no further infractions will be tolerated.    If any additional pain contract or behavioral adherence contract infractions occur, I recommend stopping opioids.    Pt doing well with adherence, per our last few visits.     (2) Pain coping skills- addressed compliance, as well as pacing and mindful breathing, body scan, and problem solving    (3) follow-in 1 month

## 2022-10-29 ENCOUNTER — Telehealth: Admit: 2022-10-29 | Discharge: 2022-10-30 | Payer: MEDICARE | Attending: Psychologist | Primary: Psychologist

## 2022-10-29 DIAGNOSIS — F1190 Opioid use, unspecified, uncomplicated: Principal | ICD-10-CM

## 2022-10-29 DIAGNOSIS — F419 Anxiety disorder, unspecified: Principal | ICD-10-CM

## 2022-10-29 DIAGNOSIS — F4321 Adjustment disorder with depressed mood: Principal | ICD-10-CM

## 2022-10-29 NOTE — Unmapped (Signed)
[facility]   Confidential Psychological Therapy Session    Patient Name: Natalie [name]  Medical Record Number: [id number]  Date of Service: October 29, 2022  Attending Psychologist: Caroline More, PhD  CPT Procedure Code: 84696 for 20 minutes of face to face counseling    This visit was performed face to face with interactive technology using a HIPPA compliant audio/visual platform. We reviewed confidentiality today. The patient was present in [place], a state in which this provider is licensed and able to provide care (location and contact information confirmed), attended this visit alone, and consented to this virtual pain psychology visit.    REFERRING PHYSICIAN: Criss Rosales, MD    CHIEF COMPLAINT AND REASON FOR VISIT:  COM follow up evaluation/pain coping skills; CBT/ACT for pain; grief    SUBJECTIVE / HISTORY OF PRESENT ILLNESS: [name] is a very pleasant 52 y.o.  female with chronic lower back pain, chronic headaches, and myofascial pain. Her pain started in 1999 in her lower back.  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  She had a spinal fusion in 2001 at the L5-S1 level.  She has had bilateral shoulder pain, neck pain, lower back pain, and b/l knees.  She also described a radiculopathy b/l that goes from her back to her bilateral buttocks and down to her toes.      Pt initially established with Dr. Oda Kilts in 08/2016 and then was was first evaluated by me in 07/2017. Last follow up with me was 10/26/2022.  Patient processes thoughts and feelings related to her car situation, her son's mental health, and her own coping with recent psychosocial stressors.  She discusses how she is working out regularly and even worked out twice yesterday.  Discussed how she feels better when she is physically active, using and moving her body.  Patient has reflected on building confidence in herself and her ability to cope with challenging things.  She expresses gratitude today.  She reviews plans to continue to engage with pleasant activity planning.    Patient reports good adherence to COM (Percocet). UTS UTD (most recent 01/11/22 was appropriate).      OBJECTIVE / MENTAL STATUS:    Appearance:   Appears stated age and Clean/Neat   Motor:  No abnormal movements   Speech/Language:   Normal rate, volume, tone, fluency   Mood:  Irritable   Affect:  Blunted   Thought process:  Logical, linear, clear, coherent, goal directed   Thought content:    Denies SI, HI, self harm, delusions, obsessions, paranoid ideation, or ideas of reference   Perceptual disturbances:    Denies auditory and visual hallucinations, behavior not concerning for response to internal stimuli   Orientation:  Oriented to person, place, time, and general circumstances   Attention:  Able to fully attend without fluctuations in consciousness   Concentration:  Able to fully concentrate and attend   Memory:  Immediate, short-term, long-term, and recall grossly intact    Fund of knowledge:   Consistent with level of education and development   Insight:    Fair   Judgment:   Intact   Impulse Control:  Intact       DIAGNOSTIC IMPRESSION:   Anxiety NOS  Chronic continuou use of opiates  Grief    ASSESSMENT:   [name] is a very pleasant 52 y.o.  female from [place], [place] with chronic lower back pain, chronic headaches,  and myofascial pain. Her pain started in 1999 in her lower back.  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  She had a spinal fusion in 2001 at the L5-S1 level.  She has had bilateral shoulder pain, neck pain, lower back pain, and b/l knees.  She also described a radiculopathy b/l that goes from her back to her bilateral buttocks and down to her toes.  The patient is currently considered to be high risk due to prior nonadherence, but appropriate with a behavioral adherence plan in place.  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 She continues to participate actively in psychotherapy.    PLAN:   (1) COM - high risk but remains appropriate with a behavioral adherence plan also in place.    -Patient MUST meet with pain psychology/me once a month.   -No additional overuse of opioids will be tolerated.    -Patient should always bring her medication to clinic for pill counts.  -Patient was informed she has had numerous infractions with respect to her pain contract over the years, and that no further infractions will be tolerated.    If any additional pain contract or behavioral adherence contract infractions occur, I recommend stopping opioids.    Pt doing well with adherence, per our last few visits.     (2) Pain coping skills- addressed compliance, as well as pacing and mindful breathing, body scan, and problem solving    (3) follow-in 1 month

## 2022-11-01 NOTE — Unmapped (Unsigned)
Last Office visit 10/07/22 with Dr. Cammie Mcgee    Informed about prolonged sitting and medicines such as Gabapentin, causing fluid retention.  Will consider advising to take hydrochlorothiazide 12.5 mg daily, and even consider doubling up it's dose for leg swelling, if it continues to persist.

## 2022-11-03 ENCOUNTER — Telehealth: Admit: 2022-11-03 | Discharge: 2022-11-04 | Payer: MEDICARE | Attending: Family | Primary: Family

## 2022-11-03 DIAGNOSIS — G932 Benign intracranial hypertension: Principal | ICD-10-CM

## 2022-11-03 DIAGNOSIS — G43109 Migraine with aura, not intractable, without status migrainosus: Principal | ICD-10-CM

## 2022-11-03 DIAGNOSIS — M7061 Trochanteric bursitis, right hip: Principal | ICD-10-CM

## 2022-11-03 DIAGNOSIS — M1711 Unilateral primary osteoarthritis, right knee: Principal | ICD-10-CM

## 2022-11-03 DIAGNOSIS — M1712 Unilateral primary osteoarthritis, left knee: Principal | ICD-10-CM

## 2022-11-03 MED ORDER — OXYCODONE-ACETAMINOPHEN 10 MG-325 MG TABLET
ORAL | 0 refills | 20 days | Status: CP | PRN
Start: 2022-11-03 — End: ?

## 2022-11-03 MED ORDER — ONDANSETRON HCL 4 MG TABLET
ORAL_TABLET | Freq: Three times a day (TID) | ORAL | 2 refills | 10 days | Status: CP | PRN
Start: 2022-11-03 — End: 2023-03-03

## 2022-11-03 MED ORDER — VENLAFAXINE ER 37.5 MG CAPSULE,EXTENDED RELEASE 24 HR
ORAL_CAPSULE | ORAL | 5 refills | 45 days | Status: CP
Start: 2022-11-03 — End: 2023-06-01

## 2022-11-03 MED ORDER — DICLOFENAC 20 MG/GRAM/ACTUATION (2 %) TOPICAL SOLN METERED-DOSE PUMP
2 refills | 0 days
Start: 2022-11-03 — End: ?

## 2022-11-03 NOTE — Unmapped (Signed)
-   Start Effexor XR 37.5 mg daily x30 days, then increase to 75 mg daily. Take in the morning. When you reach 75 mg, may stop escitalopram.   - Botox injections (scheduled 11/15/22)  - Ondansetron (Zofran) 4 mg every 8 hours as needed for migraine with nausea  - Return in 6 months

## 2022-11-03 NOTE — Unmapped (Signed)
[facility] Refill Coordination Note    Natalie [name], [place]: 1970/07/03  Phone: There are no phone numbers on file.      All above HIPAA information was verified with patient.         11/02/2022     5:31 PM   Specialty Rx Medication Refill Questionnaire   Which Medications would you like refilled and shipped? Zolair   Please list all current allergies: Bupropion   Have you missed any doses in the last 30 days? No   Have you had any changes to your medication(s) since your last refill? No   How many days remaining of each medication do you have at home? 0   If receiving an injectable medication, next injection date is 11/06/2022   Have you experienced any side effects in the last 30 days? No   Please enter the full address (street address, city, state, zip code) where you would like your medication(s) to be delivered to. [address] , [name],[id number]   Please specify on which day you would like your medication(s) to arrive. Note: if you need your medication(s) within 3 days, please call the pharmacy to schedule your order at [phone]  11/04/2022   Has your insurance changed since your last refill? No   Would you like a pharmacist to call you to discuss your medication(s)? No   Do you require a signature for your package? (Note: if we are billing Medicare Part B or your order contains a controlled substance, we will require a signature) No         Completed refill call assessment today to schedule patient's medication shipment from the [facility] [phone]).  All relevant notes have been reviewed.       Confirmed patient received a Conservation officer, historic buildings and a Surveyor, mining with first shipment. The patient will receive a drug information handout for each medication shipped and additional FDA Medication Guides as required.         REFERRAL TO PHARMACIST     Referral to the pharmacist: Not needed      [facility]     Shipping address confirmed in Epic.     Delivery Scheduled: Yes, Expected medication delivery date: 11/04/2022.     Medication will be delivered via Same Day Courier to the prescription address in Epic WAM.    [name]   [facility] Specialty Technician
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
## 2022-11-04 MED FILL — XOLAIR 150 MG/ML SUBCUTANEOUS SYRINGE: SUBCUTANEOUS | 28 days supply | Qty: 4 | Fill #5

## 2022-11-15 ENCOUNTER — Ambulatory Visit
Admit: 2022-11-15 | Discharge: 2022-11-16 | Payer: MEDICARE | Attending: Vascular Neurology | Primary: Vascular Neurology

## 2022-11-15 DIAGNOSIS — G43E01 Chronic migraine with aura, not intractable, with status migrainosus: Principal | ICD-10-CM

## 2022-11-15 DIAGNOSIS — G43109 Migraine with aura, not intractable, without status migrainosus: Principal | ICD-10-CM

## 2022-11-15 MED ADMIN — onabotulinumtoxin Type A (BOTOX) injection 200 Units: 200 [IU] | INTRADERMAL | @ 19:00:00 | Stop: 2022-11-15

## 2022-11-16 NOTE — Unmapped (Signed)
Botox for Migraine procedure note  11/15/2022       Patient:  Natalie [name] DOB:  13-Mar-1970   MRN:  [id number]        HISTORY:  52 y.o. year old female with history of chronic migraine/headaches.     PRESERVICE: On the day of the procedure, consent was obtained from patient. Patients name and date of birth was verified. Procedure was explained to the patient and questions answered. A copy of full prescribing information for Botox was given to the patient and reviewed with the patient.     INTRASERVICE: Patient assisted onto the exam table. Injection sites were cleaned with alcohol swab stick. After explaining the procedure and potential complications including weakness, informed consent was obtained. Two 100 Unit vials of botulinum toxin   NDC is 8119-1478-29 Lot : [id number] Exp 05/2024   were mixed carefully with 4 cc of sodium chloride 0.9 %.     Botox was transferred into a sterile 30- gauge, 0.5-inch needle (4 syringed total). Botox was administered by injection into 7 fixed- site head/neck regions as follows: frontalis (20 U in 4 sites), corrugator (10U in 2 sites); procerus (5U in 1 site); occipitalis (30U in 6 sites); temporalis (40 U in 8 sites); trapezius (30 U in 6 sites); and the cervical paraspinal muscle group (20 U in 4 sites) for total administered dose 155 U (across 31 sites). With exception of an injection into the procerus, which was injected at 1 site (midline); all muscles were injected bilaterally.     POSTSERVICE:  The injection materials and empty Botox vial was discarded by nurse/physician in biohazard waste. 45 U of Botox wasted by nurse/physician. Reviewed common side effects to include neck pain, headache, dry mouth, droopy eye lids and discomfort at injection sites. Patient tolerated procedure well.     Natalie Dutan Erasmo Score, MD  Electronic Signature  11/16/2022 9:02 AM

## 2022-11-17 ENCOUNTER — Ambulatory Visit: Admit: 2022-11-17 | Discharge: 2022-11-17 | Payer: MEDICARE

## 2022-11-17 DIAGNOSIS — M7989 Other specified soft tissue disorders: Principal | ICD-10-CM

## 2022-11-17 DIAGNOSIS — R300 Dysuria: Principal | ICD-10-CM

## 2022-11-17 DIAGNOSIS — R6884 Jaw pain: Principal | ICD-10-CM

## 2022-11-17 MED ORDER — NITROFURANTOIN MONOHYDRATE/MACROCRYSTALS 100 MG CAPSULE
ORAL_CAPSULE | Freq: Two times a day (BID) | ORAL | 0 refills | 7 days | Status: CP
Start: 2022-11-17 — End: 2022-11-24

## 2022-11-17 MED ORDER — IBUPROFEN 800 MG TABLET
ORAL_TABLET | ORAL | 0 refills | 30 days | Status: CP | PRN
Start: 2022-11-17 — End: ?

## 2022-11-17 NOTE — Unmapped (Signed)
Subjective:       HPI: Natalie [name] is a 52 y.o. female  patient here for   Chief Complaint   Patient presents with    Possible UTI   Regional scheduling patient -     1.5 weeks - burning, frequent urination. No blood but she smelled blood   Has had it before but it's been a while.   Couldn't even hold it Friday.   No periods since hysterectomy 2008 and has estrogen.     Yesterday had sharp stomach and back pains  Had dental work last month and had lockjaw after. Temporary crown came off.   She said Plaquenil is for hot flashes?       Past Medical/Surgical History:     Past Medical History:   Diagnosis Date    Allergic conjunctivitis 11/23/2018    Anemia 1986    Arthritis     Breast mass     Chondromalacia of left knee     Chondromalacia of right knee     Depressive disorder     Fibrocystic breast     GERD (gastroesophageal reflux disease)     Hematuria     History of kidney stones     History of transfusion     Hyperlipidemia 1999    Hypothyroidism     Lumbar disc disease 1998    She was injured at work at the age of 41 and then had disk surgery 2 years later     Menopause ovarian failure     Menopause, premature     Migraine with aura     Neuromuscular disorder (CMS-HCC) 1999    Obesity (BMI 30-39.9) 11/23/2018    Ovarian cyst     Plantar fasciitis     Pseudotumor cerebri     Rash due to allergy 11/23/2018    Sickle cell trait (CMS-HCC)     Urinary tract infection     Vaginitis      Past Surgical History:   Procedure Laterality Date    BACK SURGERY  2001    BREAST BIOPSY Bilateral     When patient was 16 & 17 Both Negative    HYSTERECTOMY  2008    PR REMOVAL OF TONSILS,12+ Y/O Bilateral 10/10/2019    Procedure: TONSILLECTOMY;  Surgeon: Lona Millard, MD;  Location: [facility];  Service: ENT    SALPINGOOPHORECTOMY Bilateral 2007    SPINE SURGERY  2001    TOTAL VAGINAL HYSTERECTOMY  01/04/2006    Uterine Prolapse-Dr. Leeanne Rio OP Note    TUBAL LIGATION  1995     Current Outpatient Medications   Medication Sig Dispense Refill    albuterol HFA 90 mcg/actuation inhaler Inhale 2 puffs every four (4) hours as needed for wheezing. 54 g 0    albuterol HFA 90 mcg/actuation inhaler Inhale 2 puffs every six (6) hours as needed for wheezing. 54 g 0    albuterol HFA 90 mcg/actuation inhaler Inhale 2 puffs every six (6) hours as needed for wheezing or shortness of breath. 54 g 0    azelastine (ASTELIN) 137 mcg (0.1 %) nasal spray 2 sprays into each nostril two (2) times a day. 30 mL 0    azelastine (OPTIVAR) 0.05 % ophthalmic solution Administer 2 drops to both eyes daily as needed. 6 mL 5    baclofen (LIORESAL) 20 MG tablet 10 - 20 mg three times a day as needed for spasms. 90 tablet 2    cetirizine (ZYRTEC) 10  MG chewable tablet Chew 1 tablet (10 mg total) at bedtime as needed for allergies.      diclofenac sodium (VOLTAREN) 1 % gel Apply 2 g topically four (4) times a day. 100 g 6    empty container Misc Use as directed to dispose of Xolair syringes 1 each 2    EPINEPHrine (EPIPEN) 0.3 mg/0.3 mL injection Inject 0.3 mL (0.3 mg total) into the muscle once as needed for anaphylaxis (Difficulty breathing, throat closing, etc) for up to 1 dose. 1 each 12    escitalopram oxalate (LEXAPRO) 10 MG tablet 1/2 tab PO Daily for 6 days. If well tolerated, increase to 1 whole tablet PO daily. 30 tablet 1    estradiol (ESTRACE) 1 MG tablet Take 1 tablet (1 mg total) by mouth daily. 90 tablet 3    famotidine (PEPCID) 20 MG tablet Take 1 tablet (20 mg total) by mouth two (2) times a day.      gabapentin (NEURONTIN) 800 MG tablet Take 1 tablet (800 mg total) by mouth two (2) times a day. 180 tablet 0    hydroCHLOROthiazide 12.5 MG tablet TAKE 1 TABLET(12.5 MG) BY MOUTH DAILY AS NEEDED FOR SWELLING 90 tablet 1    hydroxychloroquine (PLAQUENIL) 200 mg tablet Take 1 tablet (200 mg total) by mouth two (2) times a day. 60 tablet 12    ibuprofen (MOTRIN) 800 MG tablet Take 1 tablet (800 mg total) by mouth two (2) times a day as needed for pain. 60 tablet 0    nitrofurantoin, macrocrystal-monohydrate, (MACROBID) 100 MG capsule Take 1 capsule (100 mg total) by mouth two (2) times a day for 7 days. 14 capsule 0    omalizumab (XOLAIR) 150 mg/mL syringe Inject 2 mL (300 mg total) under the skin every fourteen (14) days. 2 mL 11    omalizumab (XOLAIR) 75 mg/0.5 mL syringe Inject under the skin.      ondansetron (ZOFRAN) 4 MG tablet Take 1 tablet (4 mg total) by mouth every eight (8) hours as needed for nausea (during a migraine). 30 tablet 2    oxyCODONE-acetaminophen (PERCOCET) 10-325 mg per tablet Take 1 tablet by mouth every four (4) hours as needed for pain. Max 4 tabs/day. Fill on or after: 10/04/22. Brand name only due to allergy. 120 tablet 0    oxyCODONE-acetaminophen (PERCOCET) 10-325 mg per tablet Take 1 tablet by mouth every four (4) hours as needed for pain. Max 4 tabs/day. Fill on or after: 11/03/22. Brand name only due to allergy. 120 tablet 0    [START ON 12/03/2022] oxyCODONE-acetaminophen (PERCOCET) 10-325 mg per tablet Take 1 tablet by mouth every four (4) hours as needed for pain. Max 4 tabs/day. Fill on or after: 12/03/22. Brand name only due to allergy. 120 tablet 0    pantoprazole (PROTONIX) 40 MG tablet TAKE 1 TABLET(40 MG) BY MOUTH DAILY 30 MINUTES BEFORE BREAKFAST AS NEEDED 30 tablet 5    potassium chloride 20 MEQ ER tablet Take 1 tablet (20 mEq total) by mouth daily. 90 tablet 3    PROCHAMBER Spcr       progesterone (PROMETRIUM) 100 MG capsule Take 1 capsule (100 mg total) by mouth nightly. 90 capsule 3    rosuvastatin (CRESTOR) 10 MG tablet Take 1 tablet (10 mg total) by mouth nightly. 90 tablet 3    triamcinolone (KENALOG) 0.1 % cream Apply topically two (2) times a day as needed WHEN ALLERGIES FLARE-UP AND CAUSES RASH / ITCHING 80 g 0  ubrogepant (UBRELVY) 50 mg tablet TAKE 1 TABLET BY MOUTH FOR HEADACHE. MAY REPEAT IN 2 HOURS IF NEEDED. LIMIT 2 TABLETS PER DAY AND NO MORE THAN 4 TABLETS PER WEEK 16 tablet 3 venlafaxine (EFFEXOR XR) 37.5 MG 24 hr capsule Take 1 capsule (37.5 mg total) by mouth daily for 30 days, THEN 2 capsules (75 mg total) daily. 60 capsule 5    XHANCE 93 mcg/actuation AerB 1 spray into each nostril two (2) times a day as needed. 16 mL 11     Current Facility-Administered Medications   Medication Dose Route Frequency Provider Last Rate Last Admin    omalizumab Geoffry Paradise) injection 300 mg  300 mg Subcutaneous Q28 Days Volertas, Gust Brooms, MD   300 mg at 03/09/22 1441       Social History:     Social History     Social History Narrative    Marital Status - Single     Children - Son(s) [2]    Pets - Dog (1) Turtle (1)     Household - Lives with sons and grandson Ephriam Knuckles)     Occupation - Disabled since 2002    Tobacco/Alcohol/Drug Use - Denies      Diet - Regular    Exercise/Sports - Walking     Hobbies - Conservation officer, historic buildings - Highest Degree Psychologist, forensic)     Country of Origin - [place]                            Objective:     Vitals:    11/17/22 1516   BP: 144/81   Pulse: 74   SpO2: 98%     Vitals:    11/17/22 1516   Weight: 72.1 kg (159 lb)     Body mass index is 30.04 kg/m??.    General: Alert, non-toxic, no acute distress  HEENT:  sclera/conjunctivae clear, external ear canals clear, TMs pearly gray with normal landmarks, normocephalic/atraumatic, OP moist and without lesions     Neck:  supple, no lymphadenopathy  Heart:  normal S1S2, regular rhythm, No Rubs/ Gallops or murmur  Lungs:  CTA bilaterally, good air movement. No rales/crackles, rhonchi or wheezing, Normal effort  Abdomen: soft, NT/ND, BS present, no guarding or rigidity, no masses felt, no hepatosplenomegaly, no hernias present - subj states pain in both CVAs and suprapubic  Extremities:  warm, well-perfused, no pitting edema, puffy feet   Neuro:  Grossly intact  Psych:  normal affect, insight and judgement      Assessment and Plan:     [name] was seen today for possible uti.    Diagnoses and all orders for this visit:    Dysuria  Comments:  Not convincing for UTI but will culture and cover. Last month culture was not UTI.  Orders:  -     POCT urinalysis dipstick  -     nitrofurantoin, macrocrystal-monohydrate, (MACROBID) 100 MG capsule; Take 1 capsule (100 mg total) by mouth two (2) times a day for 7 days.  -     Urine Culture    Jaw pain  Comments:  Consider PT. Post dental work  Orders:  -     ibuprofen (MOTRIN) 800 MG tablet; Take 1 tablet (800 mg total) by mouth two (2) times a day as needed for pain.    Leg swelling  Comments:  gabapentin 800 - since 2001 and swelling just now starting. Dropped herself to  just 1.Takes HCTZ 3X a week and can do daily or double up. She has normal K.     Ct abd May 2024 negative     No follow-ups on file.         [facility]:     Medication adherence and barriers to the treatment plan have been addressed. Opportunities to optimize healthy behaviors have been discussed. Patient / caregiver voiced understanding.

## 2022-11-20 DIAGNOSIS — L501 Idiopathic urticaria: Principal | ICD-10-CM

## 2022-11-20 MED ORDER — XOLAIR 150 MG/ML SUBCUTANEOUS SYRINGE
SUBCUTANEOUS | 11 refills | 14 days
Start: 2022-11-20 — End: ?

## 2022-11-22 NOTE — Unmapped (Signed)
[facility] Refill Coordination Note    Natalie [name], Natalie [name]: Mar 11, 1970  Phone: There are no phone numbers on file.      All above HIPAA information was verified with patient.         11/21/2022     1:26 PM   Specialty Rx Medication Refill Questionnaire   Which Medications would you like refilled and shipped? Zolair   Please list all current allergies: Grass   Have you missed any doses in the last 30 days? No   Have you had any changes to your medication(s) since your last refill? No   How many days remaining of each medication do you have at home? None   If receiving an injectable medication, next injection date is 11/21/2022   Have you experienced any side effects in the last 30 days? No   Please enter the full address (street address, city, state, zip code) where you would like your medication(s) to be delivered to. [address] [name] , [address]   Please specify on which day you would like your medication(s) to arrive. Note: if you need your medication(s) within 3 days, please call the pharmacy to schedule your order at [phone]  11/21/2022   Has your insurance changed since your last refill? No   Would you like a pharmacist to call you to discuss your medication(s)? No   Do you require a signature for your package? (Note: if we are billing Medicare Part B or your order contains a controlled substance, we will require a signature) No   Additional Comments: N/A         Completed refill call assessment today to schedule patient's medication shipment from the [facility] [phone]).  All relevant notes have been reviewed.       Confirmed patient received a Conservation officer, historic buildings and a Surveyor, mining with first shipment. The patient will receive a drug information handout for each medication shipped and additional FDA Medication Guides as required.         REFERRAL TO PHARMACIST     Referral to the pharmacist: Not needed      [facility]     Shipping address confirmed in Epic.     Delivery Scheduled: Yes, Expected medication delivery date: 11/24/22.  However, Rx request for refills was sent to the provider as there are none remaining.   Spoke to  Natalie [name] and  she is okay with  delivery on  11/24/22     Medication will be delivered via Same Day Courier to the prescription address in Epic WAM.    Natalie [name]   [facility] Specialty Technician

## 2022-11-23 MED ORDER — XOLAIR 150 MG/ML SUBCUTANEOUS SYRINGE
SUBCUTANEOUS | 11 refills | 28 days | Status: CP
Start: 2022-11-23 — End: ?
  Filled 2022-11-24: qty 4, 28d supply, fill #0

## 2022-12-03 MED ORDER — OXYCODONE-ACETAMINOPHEN 10 MG-325 MG TABLET
ORAL | 0 refills | 20 days | Status: CP | PRN
Start: 2022-12-03 — End: ?

## 2022-12-13 ENCOUNTER — Ambulatory Visit: Admit: 2022-12-13 | Discharge: 2022-12-14 | Payer: MEDICARE

## 2022-12-13 DIAGNOSIS — M170 Bilateral primary osteoarthritis of knee: Principal | ICD-10-CM

## 2022-12-13 MED ADMIN — triamcinolone acetonide (KENALOG) injection 20 mg: 20 mg | INTRA_ARTICULAR | @ 16:00:00 | Stop: 2022-12-13

## 2022-12-13 MED ADMIN — bupivacaine HCl (MARCAINE) 0.5 % (5 mg/mL) injection 25 mg: 5 mL | @ 16:00:00 | Stop: 2022-12-13

## 2022-12-13 NOTE — Unmapped (Signed)
KA, can you repeat visco bilateral knees? Thank you

## 2022-12-13 NOTE — Unmapped (Signed)
PATIENT: Natalie [name]      AGE: 52 y.o.     DOB: 1970-07-07         Date of Examination:  12/13/2022      Primary Care Provider:  Royal Hawthorn, MD     Chief Complaint     Chief Complaint   Patient presents with    Left Knee - Follow-up        History Of Present Illness   Natalie [name] presents for a follow-up of her left knee severe medial compartment osteoarthritis and right knee osteoarthritis. She is a patient of Dr. Leone Payor and has been treated with steroid injections and viscosupplementation.  Her last steroid injection to the left knee was in July and she noted improvement with this until recently.  She also had bilateral knee viscosupplementation in March with months of relief.  She would like to consider repeat steroid injections today as well as viscosupplementation.  She is taking Tylenol for pain.    Home Medications     Current Outpatient Medications:     albuterol HFA 90 mcg/actuation inhaler, Inhale 2 puffs every six (6) hours as needed for wheezing., Disp: 54 g, Rfl: 0    albuterol HFA 90 mcg/actuation inhaler, Inhale 2 puffs every six (6) hours as needed for wheezing or shortness of breath., Disp: 54 g, Rfl: 0    azelastine (ASTELIN) 137 mcg (0.1 %) nasal spray, 2 sprays into each nostril two (2) times a day., Disp: 30 mL, Rfl: 0    azelastine (OPTIVAR) 0.05 % ophthalmic solution, Administer 2 drops to both eyes daily as needed., Disp: 6 mL, Rfl: 5    baclofen (LIORESAL) 20 MG tablet, 10 - 20 mg three times a day as needed for spasms., Disp: 90 tablet, Rfl: 2    cetirizine (ZYRTEC) 10 MG chewable tablet, Chew 1 tablet (10 mg total) at bedtime as needed for allergies., Disp: , Rfl:     diclofenac sodium (VOLTAREN) 1 % gel, Apply 2 g topically four (4) times a day., Disp: 100 g, Rfl: 6    empty container Misc, Use as directed to dispose of Xolair syringes, Disp: 1 each, Rfl: 2    EPINEPHrine (EPIPEN) 0.3 mg/0.3 mL injection, Inject 0.3 mL (0.3 mg total) into the muscle once as needed for anaphylaxis (Difficulty breathing, throat closing, etc) for up to 1 dose., Disp: 1 each, Rfl: 12    escitalopram oxalate (LEXAPRO) 10 MG tablet, 1/2 tab PO Daily for 6 days. If well tolerated, increase to 1 whole tablet PO daily., Disp: 30 tablet, Rfl: 1    estradiol (ESTRACE) 1 MG tablet, Take 1 tablet (1 mg total) by mouth daily., Disp: 90 tablet, Rfl: 3    famotidine (PEPCID) 20 MG tablet, Take 1 tablet (20 mg total) by mouth two (2) times a day., Disp: , Rfl:     gabapentin (NEURONTIN) 800 MG tablet, Take 1 tablet (800 mg total) by mouth two (2) times a day., Disp: 180 tablet, Rfl: 0    hydroCHLOROthiazide 12.5 MG tablet, TAKE 1 TABLET(12.5 MG) BY MOUTH DAILY AS NEEDED FOR SWELLING, Disp: 90 tablet, Rfl: 1    hydroxychloroquine (PLAQUENIL) 200 mg tablet, Take 1 tablet (200 mg total) by mouth two (2) times a day., Disp: 60 tablet, Rfl: 12    ibuprofen (MOTRIN) 800 MG tablet, Take 1 tablet (800 mg total) by mouth two (2) times a day as needed for pain., Disp: 60 tablet, Rfl: 0    omalizumab (  XOLAIR) 150 mg/mL syringe, Inject 2 mL (300 mg total) under the skin every fourteen (14) days., Disp: 4 mL, Rfl: 11    omalizumab (XOLAIR) 75 mg/0.5 mL syringe, Inject under the skin., Disp: , Rfl:     ondansetron (ZOFRAN) 4 MG tablet, Take 1 tablet (4 mg total) by mouth every eight (8) hours as needed for nausea (during a migraine)., Disp: 30 tablet, Rfl: 2    oxyCODONE-acetaminophen (PERCOCET) 10-325 mg per tablet, Take 1 tablet by mouth every four (4) hours as needed for pain. Max 4 tabs/day. Fill on or after: 10/04/22. Brand name only due to allergy., Disp: 120 tablet, Rfl: 0    oxyCODONE-acetaminophen (PERCOCET) 10-325 mg per tablet, Take 1 tablet by mouth every four (4) hours as needed for pain. Max 4 tabs/day. Fill on or after: 11/03/22. Brand name only due to allergy., Disp: 120 tablet, Rfl: 0    oxyCODONE-acetaminophen (PERCOCET) 10-325 mg per tablet, Take 1 tablet by mouth every four (4) hours as needed for pain. Max 4 tabs/day. Fill on or after: 12/03/22. Brand name only due to allergy., Disp: 120 tablet, Rfl: 0    pantoprazole (PROTONIX) 40 MG tablet, TAKE 1 TABLET(40 MG) BY MOUTH DAILY 30 MINUTES BEFORE BREAKFAST AS NEEDED, Disp: 30 tablet, Rfl: 5    potassium chloride 20 MEQ ER tablet, Take 1 tablet (20 mEq total) by mouth daily., Disp: 90 tablet, Rfl: 3    PROCHAMBER Spcr, , Disp: , Rfl:     progesterone (PROMETRIUM) 100 MG capsule, Take 1 capsule (100 mg total) by mouth nightly., Disp: 90 capsule, Rfl: 3    rosuvastatin (CRESTOR) 10 MG tablet, Take 1 tablet (10 mg total) by mouth nightly., Disp: 90 tablet, Rfl: 3    triamcinolone (KENALOG) 0.1 % cream, Apply topically two (2) times a day as needed WHEN ALLERGIES FLARE-UP AND CAUSES RASH / ITCHING, Disp: 80 g, Rfl: 0    ubrogepant (UBRELVY) 50 mg tablet, TAKE 1 TABLET BY MOUTH FOR HEADACHE. MAY REPEAT IN 2 HOURS IF NEEDED. LIMIT 2 TABLETS PER DAY AND NO MORE THAN 4 TABLETS PER WEEK, Disp: 16 tablet, Rfl: 3    venlafaxine (EFFEXOR XR) 37.5 MG 24 hr capsule, Take 1 capsule (37.5 mg total) by mouth daily for 30 days, THEN 2 capsules (75 mg total) daily., Disp: 60 capsule, Rfl: 5    XHANCE 93 mcg/actuation AerB, 1 spray into each nostril two (2) times a day as needed., Disp: 16 mL, Rfl: 11    albuterol HFA 90 mcg/actuation inhaler, Inhale 2 puffs every four (4) hours as needed for wheezing., Disp: 54 g, Rfl: 0    Current Facility-Administered Medications:     omalizumab Geoffry Paradise) injection 300 mg, 300 mg, Subcutaneous, Q28 Days, Volertas, Sofija Dalia, MD, 300 mg at 03/09/22 1441     Allergies   Buprenorphine hcl, Pregabalin, Buprenorphine, Cephalexin monohydrate, Doxycycline, Oxycodone hcl, Adhesive tape-silicones, Doxycycline hyclate (bulk), Keflex [cephalexin], Opioids - morphine analogues, and Oxycodone-acetaminophen    Medical History     Past Medical History:   Diagnosis Date    Allergic conjunctivitis 11/23/2018    Anemia 1986    Arthritis     Breast mass     Chondromalacia of left knee     Chondromalacia of right knee     Depressive disorder     Fibrocystic breast     GERD (gastroesophageal reflux disease)     Hematuria     History of kidney stones     History of  transfusion     Hyperlipidemia 1999    Hypothyroidism     Lumbar disc disease 1998    She was injured at work at the age of 62 and then had disk surgery 2 years later     Menopause ovarian failure     Menopause, premature     Migraine with aura     Neuromuscular disorder (CMS-HCC) 1999    Obesity (BMI 30-39.9) 11/23/2018    Ovarian cyst     Plantar fasciitis     Pseudotumor cerebri     Rash due to allergy 11/23/2018    Sickle cell trait (CMS-HCC)     Urinary tract infection     Vaginitis        Surgical History     Past Surgical History:   Procedure Laterality Date    BACK SURGERY  2001    BREAST BIOPSY Bilateral     When patient was 16 & 17 Both Negative    HYSTERECTOMY  2008    PR REMOVAL OF TONSILS,12+ Y/O Bilateral 10/10/2019    Procedure: TONSILLECTOMY;  Surgeon: Lona Millard, MD;  Location: OR [facility];  Service: ENT    SALPINGOOPHORECTOMY Bilateral 2007    SPINE SURGERY  2001    TOTAL VAGINAL HYSTERECTOMY  01/04/2006    Uterine Prolapse-Dr. Leeanne Rio OP Note    TUBAL LIGATION  1995       Social History     Social History     Tobacco Use    Smoking status: Never     Passive exposure: Past    Smokeless tobacco: Never   Vaping Use    Vaping status: Never Used   Substance Use Topics    Alcohol use: Never    Drug use: Never       Family History     Family History   Problem Relation Age of Onset    Hypertension Mother     Heart disease Mother     Arthritis Mother     Depression Mother     Drug abuse Mother     Mental illness Mother     Ulcers Mother     COPD Mother     Heart attack Father     Mental illness Father     Asthma Father     Cancer Sister         lymphoma    Heart attack Maternal Grandmother     Heart disease Maternal Grandmother     Hypertension Maternal Grandmother Thyroid disease Maternal Grandmother     Stroke Paternal Grandfather     Depression Son     Drug abuse Son     Mental illness Son     COPD Maternal Aunt     Heart attack Cousin 77    Breast cancer Cousin 32    Thyroid disease Other     Alcohol abuse Neg Hx        Physical Exam          12/13/22 0904   BP: 127/82   Pulse: 77   Weight: 72.1 kg (159 lb)   Height: 154.9 cm (5' 1)      Pain Assessment  Pain Assessment: 0-10  0-10 Pain Scale: 4  Pain Location: Knee  Pain Orientation: Left  Pain Descriptors: Aching  Pain Frequency: Constant/continuous    Appearance:  Well nourished/well developed.  Psychiatric:  Mood and affect appropriate.    Head:   Normocephalic, atraumatic.   Skin:  Clean, dry, no lesions, no rashes.  Neuro:   Alert and oriented.  Respiratory:  Unlabored.       FOCUSED EXAM: Bilateral knees: Trace effusion.  Tenderness along the medial joint lines and tenderness on the left lateral joint line.  Minimal tenderness to the patellofemoral joints.  Full extension with flexion to 125 degrees.  Patellar crepitation with motion.  Normal gait.      Imaging   No image results found.      Assessment & Plan       ICD-10-CM   1. Primary osteoarthritis of both knees  M17.0         Plan: Left knee severe medial compartment osteoarthritis, right knee osteoarthritis    We discussed the diagnosis and further treatment for her knee.  She will begin icing the knees 20 minutes daily.  She may take tylenol 500-1000mg  twice daily as needed.  She has done well in the past with steroid injections and would like to repeat these today.  We reviewed risks and benefits and she desired to proceed.    Procedure: After sterile prep, 5 cc 0.5% Marcaine and 2 cc Kenalog was injected into the left knee.  The patient tolerated the procedure well.    Procedure: After sterile prep, 5 cc 0.5% Marcaine and 2 cc Kenalog was injected into the right knee.  The patient tolerated the procedure well.    We also discussed repeat viscosupplementation and she desired to proceed with this in the upcoming weeks.  We will get this approved with her insurance and see her back for her first injections in the series.            Carolynn Comment Russell Quinney, PA  Date: 12/13/2022  Time: 11:28 AM    This note was created utilizing Dragon Medical dictation software and may contain transcription errors missed during proofreading.

## 2022-12-14 DIAGNOSIS — M170 Bilateral primary osteoarthritis of knee: Principal | ICD-10-CM

## 2022-12-14 DIAGNOSIS — J3089 Other allergic rhinitis: Principal | ICD-10-CM

## 2022-12-14 DIAGNOSIS — R6884 Jaw pain: Principal | ICD-10-CM

## 2022-12-14 MED ORDER — DICLOFENAC 20 MG/GRAM/ACTUATION (2 %) TOPICAL SOLN METERED-DOSE PUMP
Freq: Two times a day (BID) | TOPICAL | 0 refills | 30 days | Status: CP
Start: 2022-12-14 — End: 2023-01-13

## 2022-12-14 MED ORDER — IBUPROFEN 800 MG TABLET
ORAL_TABLET | Freq: Two times a day (BID) | ORAL | 0 refills | 30 days | PRN
Start: 2022-12-14 — End: ?

## 2022-12-14 MED ORDER — AZELASTINE 137 MCG (0.1 %) NASAL SPRAY
Freq: Two times a day (BID) | NASAL | 0 refills | 55 days
Start: 2022-12-14 — End: ?

## 2022-12-14 MED ORDER — XHANCE 93 MCG/ACTUATION BREATH ACTIVATED AEROSOL
Freq: Two times a day (BID) | NASAL | 11 refills | 0 days | PRN
Start: 2022-12-14 — End: ?

## 2022-12-14 NOTE — Unmapped (Signed)
 Submitted for insurance benefits verification, will contact patient to discuss/schedule once insurance benefits/auth obtained.

## 2022-12-14 NOTE — Unmapped (Signed)
[facility] Refill Coordination Note    Specialty Medication(s) to be Shipped:   CF/Pulmonary/Asthma: Xolair 150mg /ml    Other medication(s) to be shipped: No additional medications requested for fill at this time     Natalie [name], DOB: 12-11-1970  Phone: There are no phone numbers on file.      All above HIPAA information was verified with patient.     Was a Nurse, learning disability used for this call? No    Completed refill call assessment today to schedule patient's medication shipment from the [facility] [phone]).  All relevant notes have been reviewed.     Specialty medication(s) and dose(s) confirmed: Patient reports changes to the regimen as follows: Now injecting every 14 days    Changes to medications: Vani reports no changes at this time.  Changes to insurance: No  New side effects reported not previously addressed with a pharmacist or physician: None reported  Questions for the pharmacist: No    Confirmed patient received a Conservation officer, historic buildings and a Surveyor, mining with first shipment. The patient will receive a drug information handout for each medication shipped and additional FDA Medication Guides as required.       DISEASE/MEDICATION-SPECIFIC INFORMATION        For patients on injectable medications: Patient currently has 0 doses left.  Next injection is scheduled for 12/24/22.    SPECIALTY MEDICATION ADHERENCE     Medication Adherence    Patient reported X missed doses in the last month: 0  Specialty Medication: XOLAIR 150 mg/mL syringe  Patient is on additional specialty medications: No  Patient is on more than two specialty medications: No  Any gaps in refill history greater than 2 weeks in the last 3 months: no  Demonstrates understanding of importance of adherence: yes              Were doses missed due to medication being on hold? No    XOLAIR 150 mg/mL syringe (omalizumab)  : 0 days of medicine on hand       REFERRAL TO PHARMACIST     Referral to the pharmacist: Not needed      [facility]     Shipping address confirmed in Epic.       Delivery Scheduled: Yes, Expected medication delivery date: 12/21/22.     Medication will be delivered via Same Day Courier to the prescription address in Epic WAM.    Ricci Barker   [facility] Specialty Technician

## 2022-12-15 ENCOUNTER — Ambulatory Visit: Admit: 2022-12-15 | Discharge: 2022-12-15 | Payer: MEDICARE

## 2022-12-15 DIAGNOSIS — J0110 Acute frontal sinusitis, unspecified: Principal | ICD-10-CM

## 2022-12-15 DIAGNOSIS — R319 Hematuria, unspecified: Principal | ICD-10-CM

## 2022-12-15 LAB — URINALYSIS WITH MICROSCOPY
BILIRUBIN UA: NEGATIVE
GLUCOSE UA: NEGATIVE
HYPHAL YEAST: NONE SEEN /HPF
KETONES UA: NEGATIVE
LEUKOCYTE ESTERASE UA: NEGATIVE
NITRITE UA: NEGATIVE
PH UA: 7 (ref 5.0–9.0)
PROTEIN UA: NEGATIVE
RBC UA: 6 /HPF — ABNORMAL HIGH (ref 0–3)
SPECIFIC GRAVITY UA: 1.013 (ref 1.005–1.030)
SQUAMOUS EPITHELIAL: <1 /HPF (ref 0–5)
UROBILINOGEN UA: <2
WBC CLUMPS: NONE SEEN /HPF
WBC UA: 1 /HPF (ref 0–2)
YEAST: NONE SEEN /HPF

## 2022-12-15 MED ORDER — AZELASTINE 137 MCG (0.1 %) NASAL SPRAY
Freq: Two times a day (BID) | NASAL | 5 refills | 55 days | Status: CP
Start: 2022-12-15 — End: ?

## 2022-12-15 MED ORDER — XHANCE 93 MCG/ACTUATION BREATH ACTIVATED AEROSOL
Freq: Two times a day (BID) | NASAL | 11 refills | 0 days | Status: CP | PRN
Start: 2022-12-15 — End: ?

## 2022-12-15 MED ORDER — METHYLPREDNISOLONE 4 MG TABLETS IN A DOSE PACK
Freq: Every day | ORAL | 0 refills | 1 days | Status: CP
Start: 2022-12-15 — End: 2023-12-15

## 2022-12-15 MED ORDER — AMOXICILLIN 875 MG-POTASSIUM CLAVULANATE 125 MG TABLET
ORAL_TABLET | Freq: Two times a day (BID) | ORAL | 0 refills | 7 days | Status: CP
Start: 2022-12-15 — End: 2022-12-22
  Filled 2023-02-07: qty 90, 90d supply, fill #2

## 2022-12-15 MED ORDER — IBUPROFEN 800 MG TABLET
ORAL_TABLET | Freq: Two times a day (BID) | ORAL | 0 refills | 30 days | PRN
Start: 2022-12-15 — End: ?

## 2022-12-15 NOTE — Unmapped (Signed)
Denies sore throat,  Has had sinus headache, sinus pressure, nose running in am, ears hurting, green sputum

## 2022-12-15 NOTE — Unmapped (Signed)
Assessment/Plan:      Natalie [name] was seen today for otalgia.    Diagnoses and all orders for this visit:    Acute non-recurrent frontal sinusitis  -     amoxicillin-clavulanate (AUGMENTIN) 875-125 mg per tablet; Take 1 tablet by mouth two (2) times a day for 7 days.  -     methylPREDNISolone (MEDROL DOSEPACK) 4 mg tablet; Take 1 tablet (4 mg total) by mouth daily. follow package directions    Hematuria, unspecified type  -     Urinalysis with Microscopy; Future        No follow-ups on file.     Diagnosis and plan along with any newly prescribed medication(s) were discussed in detail with this patient today. The patient verbalized understanding and agreed with the plan without language barriers or behavioral barriers to understanding unless otherwise noted.    Subjective:   HPI: Natalie [name] is a 52 y.o. female is here for    Chief Complaint   Patient presents with    Otalgia     Bilateral ear and jaw has been progressively for a week or more  Had a Crown put on tooth end of Sept had lock jaw from this per patient saw another provider and was given ibuprofen. Has been in pain since then.also was given macrobid per patient- jaw has been hurting since she had the crown put on     Pt presents today c/o bilateral ear pain since 9/24.  Reports, started off as jaw pain, and had a crown place, but still continued to have ear pain.  Reports left ear started aching but now worsening to right ear.  Denies fever, chills and body aches.  Admits to headache and runny nose.   Headache is around eyes.  Has taken ibuprofen for pain as well as tylenol mixed with ibuprofen.     Reports last time UA was checked had blood in urine.      ROS:     Negative unless noted in hpi    Vital Signs:     Wt Readings from Last 3 Encounters:   12/21/22 73.5 kg (162 lb)   12/20/22 73.5 kg (162 lb)   12/18/22 73.5 kg (162 lb)     Temp Readings from Last 3 Encounters:   12/18/22 36.7 ??C (98.1 ??F) (Tympanic)   12/15/22 36.6 ??C (97.8 ??F) (Temporal) 10/07/22 36.3 ??C (97.4 ??F) (Temporal)     BP Readings from Last 3 Encounters:   12/20/22 160/87   12/18/22 145/75   12/16/22 132/80     Pulse Readings from Last 3 Encounters:   12/20/22 77   12/18/22 69   12/16/22 82     Body mass index is 30.69 kg/m??.    Objective:      Physical Exam  Constitutional:       Appearance: Normal appearance.   HENT:      Head: Normocephalic and atraumatic.      Ears:      Comments: Mild serous fluid bilateral     Nose:      Comments: Nasal mucosa erythematous and edematous    Frontal sinuses tender  Eyes:      Conjunctiva/sclera: Conjunctivae normal.      Pupils: Pupils are equal, round, and reactive to light.   Cardiovascular:      Rate and Rhythm: Normal rate and regular rhythm.      Pulses: Normal pulses.      Heart sounds: Normal heart sounds.  Pulmonary:      Effort: Pulmonary effort is normal.      Breath sounds: Normal breath sounds.   Skin:     General: Skin is warm and dry.   Neurological:      General: No focal deficit present.      Mental Status: She is alert and oriented to person, place, and time.   Psychiatric:         Mood and Affect: Mood normal.         Behavior: Behavior normal.           Labs:     No results found for this visit on 12/15/22.

## 2022-12-15 NOTE — Unmapped (Signed)
Unable to complete refill request, medication is not included in the refill protocol.   Please review below request.     Patient is requesting the following refill  Requested Prescriptions     Pending Prescriptions Disp Refills    XHANCE 93 mcg/actuation AerB 16 mL 11     Sig: 1 spray into each nostril two (2) times a day as needed.     Signed Prescriptions Disp Refills    azelastine (ASTELIN) 137 mcg (0.1 %) nasal spray 30 mL 5     Sig: 2 sprays into each nostril two (2) times a day.     Authorizing Provider: Royal Hawthorn     Ordering User: Maurine Cane       Recent Visits  Date Type Provider Dept   10/07/22 Office Visit Neelagiri, Ardyth Man, MD [place] [facility]   09/14/22 Telemedicine Neelagiri, Ardyth Man, MD [place] [facility]   08/25/22 Office Visit Neelagiri, Ardyth Man, MD [place] [facility]   08/10/22 Office Visit Karyl Kinnier, [place] [facility]   07/16/22 Telemedicine Neelagiri, Ardyth Man, MD [place] [facility]   07/14/22 Telemedicine Tretha Sciara, [place] [facility]   06/08/22 Office Visit Neelagiri, Ardyth Man, MD [place] [facility]   05/07/22 Telemedicine Neelagiri, Ardyth Man, MD [place] [facility]   04/09/22 Office Visit Neelagiri, Ardyth Man, MD [place] [facility]   03/29/22 Telemedicine Neelagiri, Ardyth Man, MD [place] [facility]   Showing recent visits within past 365 days and meeting all other requirements  Today's Visits  Date Type Provider Dept   12/15/22 Appointment Karyl Kinnier, PA [place] [facility]   Showing today's visits and meeting all other requirements  Future Appointments  Date Type Provider Dept   02/07/23 Appointment Neelagiri, Ardyth Man, MD  [facility]   Showing future appointments within next 365 days and meeting all other requirements       Labs: Not applicable this refill

## 2022-12-16 ENCOUNTER — Ambulatory Visit: Admit: 2022-12-16 | Discharge: 2022-12-17 | Payer: MEDICARE | Attending: Sports Medicine | Primary: Sports Medicine

## 2022-12-16 DIAGNOSIS — M7062 Trochanteric bursitis, left hip: Principal | ICD-10-CM

## 2022-12-16 DIAGNOSIS — R319 Hematuria, unspecified: Principal | ICD-10-CM

## 2022-12-16 DIAGNOSIS — M7061 Trochanteric bursitis, right hip: Principal | ICD-10-CM

## 2022-12-16 NOTE — Unmapped (Addendum)
Lg Joint Inj: bilateral greater trochanteric bursa on 12/16/2022 1:30 PM  Indications: pain  Details: 22 G needle  Laterality: bilateral  Location: hip  Medications (Right): 20 mg triamcinolone acetonide 10 mg/mL; 5 mL bupivacaine (PF) 0.5 % (5 mg/mL)  Medications (Left): 20 mg triamcinolone acetonide 10 mg/mL; 5 mL bupivacaine (PF) 0.5 % (5 mg/mL)  Procedure, treatment alternatives, risks and benefits explained, specific risks discussed. Consent was given by the patient. Immediately prior to procedure a time out was called to verify the correct patient, procedure, equipment, support staff and site/side marked as required. Patient was prepped and draped in the usual sterile fashion.     Provider Attestation: The information documented by members of my medical care team was reviewed and verified for accuracy by me.                 PATIENT: Natalie [name]      AGE: 52 y.o.     DOB: 07-05-1970    Medical Record Number:  [id number]            Date of Exam:  12/16/2022      Primary Care Provider:  Royal Hawthorn, MD       Chief Complaint     Chief Complaint   Patient presents with   ??? Left Hip - Follow-up   ??? Right Hip - Follow-up        History Of Present Illness   Natalie [name] is a  52 y.o.  female who presents today for evaluation of bilateral hips.  She states that she has been treated in the past for trochanteric bursitis.  She was last treated for her hips about 4 years ago and had corticosteroid injections.  She relates that she believes that her symptoms are related to chronic low back pain as well.  She complains of lateral sided hip pain.  This is exacerbated when she lays on her side at night.  She is treated pain clinic for lower back pain and takes Percocet for the pain.  This is not helped her hip substantially though.    Today's History Update 12/16/2022 - Patient presents for follow up of bilateral hip.  She was last seen for bilateral hip pain about 1 year ago.  She was treated at that time with corticosteroid injections as well as did a physical therapy regimen for management of her hip pain.  The injections gave her good relief and lasted for about 10 months.  In the past 2 months she has had a recurrence of her symptoms.  Right hip is worse than the left.  She has pain laterally in her hips.  This is also tender when she lays on her right side      Home Medications     Current Outpatient Medications:   ???  albuterol HFA 90 mcg/actuation inhaler, Inhale 2 puffs every six (6) hours as needed for wheezing., Disp: 54 g, Rfl: 0  ???  albuterol HFA 90 mcg/actuation inhaler, Inhale 2 puffs every six (6) hours as needed for wheezing or shortness of breath., Disp: 54 g, Rfl: 0  ???  amoxicillin-clavulanate (AUGMENTIN) 875-125 mg per tablet, Take 1 tablet by mouth two (2) times a day for 7 days., Disp: 14 tablet, Rfl: 0  ???  azelastine (ASTELIN) 137 mcg (0.1 %) nasal spray, 2 sprays into each nostril two (2) times a day., Disp: 30 mL, Rfl: 5  ???  azelastine (OPTIVAR) 0.05 % ophthalmic solution, Administer 2 drops  to both eyes daily as needed., Disp: 6 mL, Rfl: 5  ???  baclofen (LIORESAL) 20 MG tablet, 10 - 20 mg three times a day as needed for spasms., Disp: 90 tablet, Rfl: 2  ???  cetirizine (ZYRTEC) 10 MG chewable tablet, Chew 1 tablet (10 mg total) at bedtime as needed for allergies., Disp: , Rfl:   ???  diclofenac sodium (VOLTAREN) 1 % gel, Apply 2 g topically four (4) times a day., Disp: 100 g, Rfl: 6  ???  diclofenac sodium 20 mg/gram /actuation(2 %) sopm, Apply 40 mg topically two (2) times a day., Disp: 1 g, Rfl: 0  ???  empty container Misc, Use as directed to dispose of Xolair syringes, Disp: 1 each, Rfl: 2  ???  EPINEPHrine (EPIPEN) 0.3 mg/0.3 mL injection, Inject 0.3 mL (0.3 mg total) into the muscle once as needed for anaphylaxis (Difficulty breathing, throat closing, etc) for up to 1 dose., Disp: 1 each, Rfl: 12  ???  escitalopram oxalate (LEXAPRO) 10 MG tablet, 1/2 tab PO Daily for 6 days. If well tolerated, increase to 1 whole tablet PO daily., Disp: 30 tablet, Rfl: 1  ???  estradiol (ESTRACE) 1 MG tablet, Take 1 tablet (1 mg total) by mouth daily., Disp: 90 tablet, Rfl: 3  ???  famotidine (PEPCID) 20 MG tablet, Take 1 tablet (20 mg total) by mouth two (2) times a day., Disp: , Rfl:   ???  gabapentin (NEURONTIN) 800 MG tablet, Take 1 tablet (800 mg total) by mouth two (2) times a day., Disp: 180 tablet, Rfl: 0  ???  hydroCHLOROthiazide 12.5 MG tablet, TAKE 1 TABLET(12.5 MG) BY MOUTH DAILY AS NEEDED FOR SWELLING, Disp: 90 tablet, Rfl: 1  ???  hydroxychloroquine (PLAQUENIL) 200 mg tablet, Take 1 tablet (200 mg total) by mouth two (2) times a day., Disp: 60 tablet, Rfl: 12  ???  ibuprofen (MOTRIN) 800 MG tablet, Take 1 tablet (800 mg total) by mouth two (2) times a day as needed for pain., Disp: 60 tablet, Rfl: 0  ???  methylPREDNISolone (MEDROL DOSEPACK) 4 mg tablet, Take 1 tablet (4 mg total) by mouth daily. follow package directions, Disp: 1 each, Rfl: 0  ???  omalizumab (XOLAIR) 150 mg/mL syringe, Inject 2 mL (300 mg total) under the skin every fourteen (14) days., Disp: 4 mL, Rfl: 11  ???  omalizumab (XOLAIR) 75 mg/0.5 mL syringe, Inject under the skin., Disp: , Rfl:   ???  ondansetron (ZOFRAN) 4 MG tablet, Take 1 tablet (4 mg total) by mouth every eight (8) hours as needed for nausea (during a migraine)., Disp: 30 tablet, Rfl: 2  ???  oxyCODONE-acetaminophen (PERCOCET) 10-325 mg per tablet, Take 1 tablet by mouth every four (4) hours as needed for pain. Max 4 tabs/day. Fill on or after: 10/04/22. Brand name only due to allergy., Disp: 120 tablet, Rfl: 0  ???  oxyCODONE-acetaminophen (PERCOCET) 10-325 mg per tablet, Take 1 tablet by mouth every four (4) hours as needed for pain. Max 4 tabs/day. Fill on or after: 11/03/22. Brand name only due to allergy., Disp: 120 tablet, Rfl: 0  ???  oxyCODONE-acetaminophen (PERCOCET) 10-325 mg per tablet, Take 1 tablet by mouth every four (4) hours as needed for pain. Max 4 tabs/day. Fill on or after: 12/03/22. Brand name only due to allergy., Disp: 120 tablet, Rfl: 0  ???  pantoprazole (PROTONIX) 40 MG tablet, TAKE 1 TABLET(40 MG) BY MOUTH DAILY 30 MINUTES BEFORE BREAKFAST AS NEEDED, Disp: 30 tablet, Rfl: 5  ???  potassium chloride 20 MEQ ER tablet, Take 1 tablet (20 mEq total) by mouth daily., Disp: 90 tablet, Rfl: 3  ???  PROCHAMBER Spcr, , Disp: , Rfl:   ???  progesterone (PROMETRIUM) 100 MG capsule, Take 1 capsule (100 mg total) by mouth nightly., Disp: 90 capsule, Rfl: 3  ???  rosuvastatin (CRESTOR) 10 MG tablet, Take 1 tablet (10 mg total) by mouth nightly., Disp: 90 tablet, Rfl: 3  ???  triamcinolone (KENALOG) 0.1 % cream, Apply topically two (2) times a day as needed WHEN ALLERGIES FLARE-UP AND CAUSES RASH / ITCHING, Disp: 80 g, Rfl: 0  ???  ubrogepant (UBRELVY) 50 mg tablet, TAKE 1 TABLET BY MOUTH FOR HEADACHE. MAY REPEAT IN 2 HOURS IF NEEDED. LIMIT 2 TABLETS PER DAY AND NO MORE THAN 4 TABLETS PER WEEK, Disp: 16 tablet, Rfl: 3  ???  venlafaxine (EFFEXOR XR) 37.5 MG 24 hr capsule, Take 1 capsule (37.5 mg total) by mouth daily for 30 days, THEN 2 capsules (75 mg total) daily., Disp: 60 capsule, Rfl: 5  ???  XHANCE 93 mcg/actuation AerB, 1 spray into each nostril two (2) times a day as needed., Disp: 16 mL, Rfl: 11  ???  albuterol HFA 90 mcg/actuation inhaler, Inhale 2 puffs every four (4) hours as needed for wheezing., Disp: 54 g, Rfl: 0    Current Facility-Administered Medications:   ???  omalizumab Geoffry Paradise) injection 300 mg, 300 mg, Subcutaneous, Q28 Days, Volertas, Sofija Dalia, MD, 300 mg at 03/09/22 1441     Allergies   Buprenorphine hcl, Pregabalin, Buprenorphine, Cephalexin monohydrate, Doxycycline, Oxycodone hcl, Adhesive tape-silicones, Doxycycline hyclate (bulk), Keflex [cephalexin], Opioids - morphine analogues, and Oxycodone-acetaminophen    Medical History     Past Medical History:   Diagnosis Date   ??? Allergic conjunctivitis 11/23/2018   ??? Anemia 1986   ??? Arthritis    ??? Breast mass    ??? Chondromalacia of left knee    ??? Chondromalacia of right knee    ??? Depressive disorder    ??? Fibrocystic breast    ??? GERD (gastroesophageal reflux disease)    ??? Hematuria    ??? History of kidney stones    ??? History of transfusion    ??? Hyperlipidemia 1999   ??? Hypothyroidism    ??? Lumbar disc disease 1998    She was injured at work at the age of 73 and then had disk surgery 2 years later    ??? Menopause ovarian failure    ??? Menopause, premature    ??? Migraine with aura    ??? Neuromuscular disorder (CMS-HCC) 1999   ??? Obesity (BMI 30-39.9) 11/23/2018   ??? Ovarian cyst    ??? Plantar fasciitis    ??? Pseudotumor cerebri    ??? Rash due to allergy 11/23/2018   ??? Sickle cell trait (CMS-HCC)    ??? Urinary tract infection    ??? Vaginitis        Surgical History     Past Surgical History:   Procedure Laterality Date   ??? BACK SURGERY  2001   ??? BREAST BIOPSY Bilateral     When patient was 16 & 17 Both Negative   ??? HYSTERECTOMY  2008   ??? PR REMOVAL OF TONSILS,13+ Y/O Bilateral 10/10/2019    Procedure: TONSILLECTOMY;  Surgeon: Lona Millard, MD;  Location: OR [place];  Service: ENT   ??? SALPINGOOPHORECTOMY Bilateral 2007   ??? SPINE SURGERY  2001   ??? TOTAL VAGINAL HYSTERECTOMY  01/04/2006  Uterine Prolapse-Dr. Leeanne Rio OP Note   ??? TUBAL LIGATION  1995        Social History     Past Medical History:   Diagnosis Date   ??? Allergic conjunctivitis 11/23/2018   ??? Anemia 1986   ??? Arthritis    ??? Breast mass    ??? Chondromalacia of left knee    ??? Chondromalacia of right knee    ??? Depressive disorder    ??? Fibrocystic breast    ??? GERD (gastroesophageal reflux disease)    ??? Hematuria    ??? History of kidney stones    ??? History of transfusion    ??? Hyperlipidemia 1999   ??? Hypothyroidism    ??? Lumbar disc disease 1998    She was injured at work at the age of 81 and then had disk surgery 2 years later    ??? Menopause ovarian failure    ??? Menopause, premature    ??? Migraine with aura    ??? Neuromuscular disorder (CMS-HCC) 1999   ??? Obesity (BMI 30-39.9) 11/23/2018   ??? Ovarian cyst    ??? Plantar fasciitis    ??? Pseudotumor cerebri    ??? Rash due to allergy 11/23/2018   ??? Sickle cell trait (CMS-HCC)    ??? Urinary tract infection    ??? Vaginitis         Family History     Family History   Problem Relation Age of Onset   ??? Hypertension Mother    ??? Heart disease Mother    ??? Arthritis Mother    ??? Depression Mother    ??? Drug abuse Mother    ??? Mental illness Mother    ??? Ulcers Mother    ??? COPD Mother    ??? Heart attack Father    ??? Mental illness Father    ??? Asthma Father    ??? Cancer Sister         lymphoma   ??? Heart attack Maternal Grandmother    ??? Heart disease Maternal Grandmother    ??? Hypertension Maternal Grandmother    ??? Thyroid disease Maternal Grandmother    ??? Stroke Paternal Grandfather    ??? Depression Son    ??? Drug abuse Son    ??? Mental illness Son    ??? COPD Maternal Aunt    ??? Heart attack Cousin 67   ??? Breast cancer Cousin 54   ??? Thyroid disease Other    ??? Alcohol abuse Neg Hx      Family Status   Relation Name Status   ??? Mother Particia Nearing   ??? Father DJ Deceased   ??? Sister Marvis Repress (Not Specified)   ??? MGM Pat (Not Specified)   ??? PGF HV (Not Specified)   ??? Son Cloretta Ned (Not Specified)   ??? Mat Gaspar Bidding Deceased   ??? Cousin Maquitta Deceased   ??? Other  (Not Specified)   ??? Neg Hx  (Not Specified)   No partnership data on file       Physical Exam          12/16/22 1326   BP: 132/80   Pulse: 82   Weight: 73.5 kg (162 lb)   Height: 154.9 cm (5' 1)        Appearance: Well nourished/well developed.  Psychiatric: Mood and affect appropriate.    Head:  Normocephalic, atraumatic.   Skin:  Clean, dry, no lesions, no rashes.  Neuro:  Alert and oriented.  Respiratory:  Unlabored.       FOCUSED EXAM: Evaluation of both hips reveals localized tenderness over the greater trochanter.  No significant pain with internal or external rotation of the hip joint.  She walked with a normal gait today.  No significant pain or weakness with resisted hip abduction.    Imaging   No results found.      Assessment & Plan     Diagnosis:   1. Greater trochanteric bursitis of both hips            Prescription:      Your Medication List            Accurate as of December 16, 2022  3:08 PM. If you have any questions, ask your nurse or doctor.                CONTINUE taking these medications      albuterol 90 mcg/actuation inhaler  Commonly known as: PROVENTIL HFA;VENTOLIN HFA  Inhale 2 puffs every four (4) hours as needed for wheezing.     albuterol 90 mcg/actuation inhaler  Commonly known as: PROAIR HFA  Inhale 2 puffs every six (6) hours as needed for wheezing.     albuterol 90 mcg/actuation inhaler  Commonly known as: PROVENTIL HFA;VENTOLIN HFA  Inhale 2 puffs every six (6) hours as needed for wheezing or shortness of breath.     amoxicillin-clavulanate 875-125 mg per tablet  Commonly known as: AUGMENTIN  Take 1 tablet by mouth two (2) times a day for 7 days.     azelastine 0.05 % ophthalmic solution  Commonly known as: OPTIVAR  Administer 2 drops to both eyes daily as needed.     azelastine 137 mcg (0.1 %) nasal spray  Commonly known as: ASTELIN  2 sprays into each nostril two (2) times a day.     baclofen 20 MG tablet  Commonly known as: LIORESAL  10 - 20 mg three times a day as needed for spasms.     cetirizine 10 MG chewable tablet  Commonly known as: ZYRTEC  Chew 1 tablet (10 mg total) at bedtime as needed for allergies.     diclofenac sodium 1 % gel  Commonly known as: VOLTAREN  Apply 2 g topically four (4) times a day.     diclofenac sodium 20 mg/gram /actuation(2 %) Sopm  Apply 40 mg topically two (2) times a day.     empty container Misc  Use as directed to dispose of Xolair syringes     EPINEPHrine 0.3 mg/0.3 mL injection  Commonly known as: EPIPEN  Inject 0.3 mL (0.3 mg total) into the muscle once as needed for anaphylaxis (Difficulty breathing, throat closing, etc) for up to 1 dose.     escitalopram oxalate 10 MG tablet  Commonly known as: LEXAPRO  1/2 tab PO Daily for 6 days. If well tolerated, increase to 1 whole tablet PO daily.     estradiol 1 MG tablet  Commonly known as: ESTRACE  Take 1 tablet (1 mg total) by mouth daily.     famotidine 20 MG tablet  Commonly known as: PEPCID  Take 1 tablet (20 mg total) by mouth two (2) times a day.     gabapentin 800 MG tablet  Commonly known as: NEURONTIN  Take 1 tablet (800 mg total) by mouth two (2) times a day.     hydroCHLOROthiazide 12.5 MG tablet  TAKE 1 TABLET(12.5 MG) BY MOUTH DAILY AS NEEDED FOR SWELLING  hydroxychloroquine 200 mg tablet  Commonly known as: PLAQUENIL  Take 1 tablet (200 mg total) by mouth two (2) times a day.     ibuprofen 800 MG tablet  Commonly known as: MOTRIN  Take 1 tablet (800 mg total) by mouth two (2) times a day as needed for pain.     methylPREDNISolone 4 mg tablet  Commonly known as: MEDROL DOSEPACK  Take 1 tablet (4 mg total) by mouth daily. follow package directions     ondansetron 4 MG tablet  Commonly known as: ZOFRAN  Take 1 tablet (4 mg total) by mouth every eight (8) hours as needed for nausea (during a migraine).     oxyCODONE-acetaminophen 10-325 mg per tablet  Commonly known as: PERCOCET  Take 1 tablet by mouth every four (4) hours as needed for pain. Max 4 tabs/day. Fill on or after: 10/04/22. Brand name only due to allergy.     oxyCODONE-acetaminophen 10-325 mg per tablet  Commonly known as: PERCOCET  Take 1 tablet by mouth every four (4) hours as needed for pain. Max 4 tabs/day. Fill on or after: 11/03/22. Brand name only due to allergy.     oxyCODONE-acetaminophen 10-325 mg per tablet  Commonly known as: PERCOCET  Take 1 tablet by mouth every four (4) hours as needed for pain. Max 4 tabs/day. Fill on or after: 12/03/22. Brand name only due to allergy.     pantoprazole 40 MG tablet  Commonly known as: Protonix  TAKE 1 TABLET(40 MG) BY MOUTH DAILY 30 MINUTES BEFORE BREAKFAST AS NEEDED     potassium chloride 20 MEQ ER tablet  Take 1 tablet (20 mEq total) by mouth daily.     PROCHAMBER Spcr  Generic drug: inhalational spacing device progesterone 100 MG capsule  Commonly known as: PROMETRIUM  Take 1 capsule (100 mg total) by mouth nightly.     rosuvastatin 10 MG tablet  Commonly known as: CRESTOR  Take 1 tablet (10 mg total) by mouth nightly.     triamcinolone 0.1 % cream  Commonly known as: KENALOG  Apply topically two (2) times a day as needed WHEN ALLERGIES FLARE-UP AND CAUSES RASH / ITCHING     UBRELVY 50 mg tablet  Generic drug: ubrogepant  TAKE 1 TABLET BY MOUTH FOR HEADACHE. MAY REPEAT IN 2 HOURS IF NEEDED. LIMIT 2 TABLETS PER DAY AND NO MORE THAN 4 TABLETS PER WEEK     venlafaxine 37.5 MG 24 hr capsule  Commonly known as: EFFEXOR XR  Take 1 capsule (37.5 mg total) by mouth daily for 30 days, THEN 2 capsules (75 mg total) daily.  Start taking on: November 03, 2022     [facility] 93 mcg/actuation Aerb  Generic drug: fluticasone propionate  1 spray into each nostril two (2) times a day as needed.     XOLAIR 75 mg/0.5 mL syringe  Generic drug: omalizumab  Inject under the skin.     XOLAIR 150 mg/mL syringe  Generic drug: omalizumab  Inject 2 mL (300 mg total) under the skin every fourteen (14) days.              Plan: We proceeded with bilateral corticosteroid injections as these have given her good relief.  She will continue working on hip and core strengthening.  We will have her return as needed in regards to her hips at this time.  Patient was instructed to follow up with primary care provider due to elevated blood pressure.   No follow-ups on file.  Lorraine Lax, MD  Date: 12/16/2022  Time: 3:08 PM

## 2022-12-16 NOTE — Unmapped (Deleted)
PATIENT: Natalie [name]      AGE: 52 y.o.     DOB: 1970-10-28    Medical Record Number:  [id number]            Date of Exam:  12/16/2022      Primary Care Provider:  Royal Hawthorn, MD       Chief Complaint     No chief complaint on file.       History Of Present Illness   Natalie [name] is a  52 y.o.  female who presents today for evaluation of bilateral hips.  She states that she has been treated in the past for trochanteric bursitis.  She was last treated for her hips about 4 years ago and had corticosteroid injections.  She relates that she believes that her symptoms are related to chronic low back pain as well.  She complains of lateral sided hip pain.  This is exacerbated when she lays on her side at night.  She is treated pain clinic for lower back pain and takes Percocet for the pain.  This is not helped her hip substantially though.    Today's History Update 12/16/2022 - Patient presents for follow up of right hip. ***             Home Medications     Current Outpatient Medications:     albuterol HFA 90 mcg/actuation inhaler, Inhale 2 puffs every four (4) hours as needed for wheezing., Disp: 54 g, Rfl: 0    albuterol HFA 90 mcg/actuation inhaler, Inhale 2 puffs every six (6) hours as needed for wheezing., Disp: 54 g, Rfl: 0    albuterol HFA 90 mcg/actuation inhaler, Inhale 2 puffs every six (6) hours as needed for wheezing or shortness of breath., Disp: 54 g, Rfl: 0    amoxicillin-clavulanate (AUGMENTIN) 875-125 mg per tablet, Take 1 tablet by mouth two (2) times a day for 7 days., Disp: 14 tablet, Rfl: 0    azelastine (ASTELIN) 137 mcg (0.1 %) nasal spray, 2 sprays into each nostril two (2) times a day., Disp: 30 mL, Rfl: 5    azelastine (OPTIVAR) 0.05 % ophthalmic solution, Administer 2 drops to both eyes daily as needed., Disp: 6 mL, Rfl: 5    baclofen (LIORESAL) 20 MG tablet, 10 - 20 mg three times a day as needed for spasms., Disp: 90 tablet, Rfl: 2 cetirizine (ZYRTEC) 10 MG chewable tablet, Chew 1 tablet (10 mg total) at bedtime as needed for allergies., Disp: , Rfl:     diclofenac sodium (VOLTAREN) 1 % gel, Apply 2 g topically four (4) times a day., Disp: 100 g, Rfl: 6    diclofenac sodium 20 mg/gram /actuation(2 %) sopm, Apply 40 mg topically two (2) times a day., Disp: 1 g, Rfl: 0    empty container Misc, Use as directed to dispose of Xolair syringes, Disp: 1 each, Rfl: 2    EPINEPHrine (EPIPEN) 0.3 mg/0.3 mL injection, Inject 0.3 mL (0.3 mg total) into the muscle once as needed for anaphylaxis (Difficulty breathing, throat closing, etc) for up to 1 dose., Disp: 1 each, Rfl: 12    escitalopram oxalate (LEXAPRO) 10 MG tablet, 1/2 tab PO Daily for 6 days. If well tolerated, increase to 1 whole tablet PO daily., Disp: 30 tablet, Rfl: 1    estradiol (ESTRACE) 1 MG tablet, Take 1 tablet (1 mg total) by mouth daily., Disp: 90 tablet, Rfl: 3    famotidine (PEPCID) 20 MG tablet, Take 1 tablet (  20 mg total) by mouth two (2) times a day., Disp: , Rfl:     gabapentin (NEURONTIN) 800 MG tablet, Take 1 tablet (800 mg total) by mouth two (2) times a day., Disp: 180 tablet, Rfl: 0    hydroCHLOROthiazide 12.5 MG tablet, TAKE 1 TABLET(12.5 MG) BY MOUTH DAILY AS NEEDED FOR SWELLING, Disp: 90 tablet, Rfl: 1    hydroxychloroquine (PLAQUENIL) 200 mg tablet, Take 1 tablet (200 mg total) by mouth two (2) times a day., Disp: 60 tablet, Rfl: 12    ibuprofen (MOTRIN) 800 MG tablet, Take 1 tablet (800 mg total) by mouth two (2) times a day as needed for pain., Disp: 60 tablet, Rfl: 0    methylPREDNISolone (MEDROL DOSEPACK) 4 mg tablet, Take 1 tablet (4 mg total) by mouth daily. follow package directions, Disp: 1 each, Rfl: 0    omalizumab (XOLAIR) 150 mg/mL syringe, Inject 2 mL (300 mg total) under the skin every fourteen (14) days., Disp: 4 mL, Rfl: 11    omalizumab (XOLAIR) 75 mg/0.5 mL syringe, Inject under the skin., Disp: , Rfl:     ondansetron (ZOFRAN) 4 MG tablet, Take 1 tablet (4 mg total) by mouth every eight (8) hours as needed for nausea (during a migraine)., Disp: 30 tablet, Rfl: 2    oxyCODONE-acetaminophen (PERCOCET) 10-325 mg per tablet, Take 1 tablet by mouth every four (4) hours as needed for pain. Max 4 tabs/day. Fill on or after: 10/04/22. Brand name only due to allergy., Disp: 120 tablet, Rfl: 0    oxyCODONE-acetaminophen (PERCOCET) 10-325 mg per tablet, Take 1 tablet by mouth every four (4) hours as needed for pain. Max 4 tabs/day. Fill on or after: 11/03/22. Brand name only due to allergy., Disp: 120 tablet, Rfl: 0    oxyCODONE-acetaminophen (PERCOCET) 10-325 mg per tablet, Take 1 tablet by mouth every four (4) hours as needed for pain. Max 4 tabs/day. Fill on or after: 12/03/22. Brand name only due to allergy., Disp: 120 tablet, Rfl: 0    pantoprazole (PROTONIX) 40 MG tablet, TAKE 1 TABLET(40 MG) BY MOUTH DAILY 30 MINUTES BEFORE BREAKFAST AS NEEDED, Disp: 30 tablet, Rfl: 5    potassium chloride 20 MEQ ER tablet, Take 1 tablet (20 mEq total) by mouth daily., Disp: 90 tablet, Rfl: 3    PROCHAMBER Spcr, , Disp: , Rfl:     progesterone (PROMETRIUM) 100 MG capsule, Take 1 capsule (100 mg total) by mouth nightly., Disp: 90 capsule, Rfl: 3    rosuvastatin (CRESTOR) 10 MG tablet, Take 1 tablet (10 mg total) by mouth nightly., Disp: 90 tablet, Rfl: 3    triamcinolone (KENALOG) 0.1 % cream, Apply topically two (2) times a day as needed WHEN ALLERGIES FLARE-UP AND CAUSES RASH / ITCHING, Disp: 80 g, Rfl: 0    ubrogepant (UBRELVY) 50 mg tablet, TAKE 1 TABLET BY MOUTH FOR HEADACHE. MAY REPEAT IN 2 HOURS IF NEEDED. LIMIT 2 TABLETS PER DAY AND NO MORE THAN 4 TABLETS PER WEEK, Disp: 16 tablet, Rfl: 3    venlafaxine (EFFEXOR XR) 37.5 MG 24 hr capsule, Take 1 capsule (37.5 mg total) by mouth daily for 30 days, THEN 2 capsules (75 mg total) daily., Disp: 60 capsule, Rfl: 5    XHANCE 93 mcg/actuation AerB, 1 spray into each nostril two (2) times a day as needed., Disp: 16 mL, Rfl: 11    Current Facility-Administered Medications:     omalizumab (XOLAIR) injection 300 mg, 300 mg, Subcutaneous, Q28 Days, Volertas, Sofija Dalia, MD, 300  mg at 03/09/22 1441     Allergies   Buprenorphine hcl, Pregabalin, Buprenorphine, Cephalexin monohydrate, Doxycycline, Oxycodone hcl, Adhesive tape-silicones, Doxycycline hyclate (bulk), Keflex [cephalexin], Opioids - morphine analogues, and Oxycodone-acetaminophen    Medical History     Past Medical History:   Diagnosis Date    Allergic conjunctivitis 11/23/2018    Anemia 1986    Arthritis     Breast mass     Chondromalacia of left knee     Chondromalacia of right knee     Depressive disorder     Fibrocystic breast     GERD (gastroesophageal reflux disease)     Hematuria     History of kidney stones     History of transfusion     Hyperlipidemia 1999    Hypothyroidism     Lumbar disc disease 1998    She was injured at work at the age of 57 and then had disk surgery 2 years later     Menopause ovarian failure     Menopause, premature     Migraine with aura     Neuromuscular disorder (CMS-HCC) 1999    Obesity (BMI 30-39.9) 11/23/2018    Ovarian cyst     Plantar fasciitis     Pseudotumor cerebri     Rash due to allergy 11/23/2018    Sickle cell trait (CMS-HCC)     Urinary tract infection     Vaginitis        Surgical History     Past Surgical History:   Procedure Laterality Date    BACK SURGERY  2001    BREAST BIOPSY Bilateral     When patient was 16 & 17 Both Negative    HYSTERECTOMY  2008    PR REMOVAL OF TONSILS,13+ Y/O Bilateral 10/10/2019    Procedure: TONSILLECTOMY;  Surgeon: Lona Millard, MD;  Location: [place];  Service: ENT    SALPINGOOPHORECTOMY Bilateral 2007    SPINE SURGERY  2001    TOTAL VAGINAL HYSTERECTOMY  01/04/2006    Uterine Prolapse-Dr. Leeanne Rio OP Note    TUBAL LIGATION  1995        Social History     Past Medical History:   Diagnosis Date    Allergic conjunctivitis 11/23/2018    Anemia 1986    Arthritis     Breast mass Chondromalacia of left knee     Chondromalacia of right knee     Depressive disorder     Fibrocystic breast     GERD (gastroesophageal reflux disease)     Hematuria     History of kidney stones     History of transfusion     Hyperlipidemia 1999    Hypothyroidism     Lumbar disc disease 1998    She was injured at work at the age of 80 and then had disk surgery 2 years later     Menopause ovarian failure     Menopause, premature     Migraine with aura     Neuromuscular disorder (CMS-HCC) 1999    Obesity (BMI 30-39.9) 11/23/2018    Ovarian cyst     Plantar fasciitis     Pseudotumor cerebri     Rash due to allergy 11/23/2018    Sickle cell trait (CMS-HCC)     Urinary tract infection     Vaginitis         Family History     Family History   Problem Relation Age of Onset  Hypertension Mother     Heart disease Mother     Arthritis Mother     Depression Mother     Drug abuse Mother     Mental illness Mother     Ulcers Mother     COPD Mother     Heart attack Father     Mental illness Father     Asthma Father     Cancer Sister         lymphoma    Heart attack Maternal Grandmother     Heart disease Maternal Grandmother     Hypertension Maternal Grandmother     Thyroid disease Maternal Grandmother     Stroke Paternal Grandfather     Depression Son     Drug abuse Son     Mental illness Son     COPD Maternal Aunt     Heart attack Cousin 80    Breast cancer Cousin 32    Thyroid disease Other     Alcohol abuse Neg Hx      Family Status   Relation Name Status    Mother Annice Pih Alive    Father DJ Deceased    Sister Marvis Repress (Not Specified)    [employer]/textile technologist (Not Specified)    PGF HV (Not Specified)    Son Cloretta Ned (Not Specified)    Mat Aunt Amada Jupiter Deceased    Cousin Maquitta Deceased    Other  (Not Specified)    Neg Hx  (Not Specified)   No partnership data on file       Physical Exam     There were no vitals filed for this visit.       Appearance: Well nourished/well developed.  Psychiatric: Mood and affect appropriate. Head:  Normocephalic, atraumatic.   Skin:  Clean, dry, no lesions, no rashes.  Neuro:  Alert and oriented.  Respiratory: Unlabored.       FOCUSED EXAM: Evaluation of both hips reveals localized tenderness over the greater trochanter.  No significant pain with internal or external rotation of the hip joint.  She walked with a normal gait today.  No significant pain or weakness with resisted hip abduction.    Imaging   No results found.      Assessment & Plan     Diagnosis:   No diagnosis found.        Prescription:      Your Medication List            Accurate as of December 16, 2022  8:21 AM. If you have any questions, ask your nurse or doctor.                CONTINUE taking these medications      albuterol 90 mcg/actuation inhaler  Commonly known as: PROVENTIL HFA;VENTOLIN HFA  Inhale 2 puffs every four (4) hours as needed for wheezing.     albuterol 90 mcg/actuation inhaler  Commonly known as: PROAIR HFA  Inhale 2 puffs every six (6) hours as needed for wheezing.     albuterol 90 mcg/actuation inhaler  Commonly known as: PROVENTIL HFA;VENTOLIN HFA  Inhale 2 puffs every six (6) hours as needed for wheezing or shortness of breath.     amoxicillin-clavulanate 875-125 mg per tablet  Commonly known as: AUGMENTIN  Take 1 tablet by mouth two (2) times a day for 7 days.     azelastine 0.05 % ophthalmic solution  Commonly known as: OPTIVAR  Administer 2 drops to both eyes daily as needed.  azelastine 137 mcg (0.1 %) nasal spray  Commonly known as: ASTELIN  2 sprays into each nostril two (2) times a day.     baclofen 20 MG tablet  Commonly known as: LIORESAL  10 - 20 mg three times a day as needed for spasms.     cetirizine 10 MG chewable tablet  Commonly known as: ZYRTEC  Chew 1 tablet (10 mg total) at bedtime as needed for allergies.     diclofenac sodium 1 % gel  Commonly known as: VOLTAREN  Apply 2 g topically four (4) times a day.     diclofenac sodium 20 mg/gram /actuation(2 %) Sopm  Apply 40 mg topically two (2) times a day.     empty container Misc  Use as directed to dispose of Xolair syringes     EPINEPHrine 0.3 mg/0.3 mL injection  Commonly known as: EPIPEN  Inject 0.3 mL (0.3 mg total) into the muscle once as needed for anaphylaxis (Difficulty breathing, throat closing, etc) for up to 1 dose.     escitalopram oxalate 10 MG tablet  Commonly known as: LEXAPRO  1/2 tab PO Daily for 6 days. If well tolerated, increase to 1 whole tablet PO daily.     estradiol 1 MG tablet  Commonly known as: ESTRACE  Take 1 tablet (1 mg total) by mouth daily.     famotidine 20 MG tablet  Commonly known as: PEPCID  Take 1 tablet (20 mg total) by mouth two (2) times a day.     gabapentin 800 MG tablet  Commonly known as: NEURONTIN  Take 1 tablet (800 mg total) by mouth two (2) times a day.     hydroCHLOROthiazide 12.5 MG tablet  TAKE 1 TABLET(12.5 MG) BY MOUTH DAILY AS NEEDED FOR SWELLING     hydroxychloroquine 200 mg tablet  Commonly known as: PLAQUENIL  Take 1 tablet (200 mg total) by mouth two (2) times a day.     ibuprofen 800 MG tablet  Commonly known as: MOTRIN  Take 1 tablet (800 mg total) by mouth two (2) times a day as needed for pain.     methylPREDNISolone 4 mg tablet  Commonly known as: MEDROL DOSEPACK  Take 1 tablet (4 mg total) by mouth daily. follow package directions     ondansetron 4 MG tablet  Commonly known as: ZOFRAN  Take 1 tablet (4 mg total) by mouth every eight (8) hours as needed for nausea (during a migraine).     oxyCODONE-acetaminophen 10-325 mg per tablet  Commonly known as: PERCOCET  Take 1 tablet by mouth every four (4) hours as needed for pain. Max 4 tabs/day. Fill on or after: 10/04/22. Brand name only due to allergy.     oxyCODONE-acetaminophen 10-325 mg per tablet  Commonly known as: PERCOCET  Take 1 tablet by mouth every four (4) hours as needed for pain. Max 4 tabs/day. Fill on or after: 11/03/22. Brand name only due to allergy.     oxyCODONE-acetaminophen 10-325 mg per tablet  Commonly known as: PERCOCET  Take 1 tablet by mouth every four (4) hours as needed for pain. Max 4 tabs/day. Fill on or after: 12/03/22. Brand name only due to allergy.     pantoprazole 40 MG tablet  Commonly known as: Protonix  TAKE 1 TABLET(40 MG) BY MOUTH DAILY 30 MINUTES BEFORE BREAKFAST AS NEEDED     potassium chloride 20 MEQ ER tablet  Take 1 tablet (20 mEq total) by mouth daily.  PROCHAMBER Spcr  Generic drug: inhalational spacing device     progesterone 100 MG capsule  Commonly known as: PROMETRIUM  Take 1 capsule (100 mg total) by mouth nightly.     rosuvastatin 10 MG tablet  Commonly known as: CRESTOR  Take 1 tablet (10 mg total) by mouth nightly.     triamcinolone 0.1 % cream  Commonly known as: KENALOG  Apply topically two (2) times a day as needed WHEN ALLERGIES FLARE-UP AND CAUSES RASH / ITCHING     UBRELVY 50 mg tablet  Generic drug: ubrogepant  TAKE 1 TABLET BY MOUTH FOR HEADACHE. MAY REPEAT IN 2 HOURS IF NEEDED. LIMIT 2 TABLETS PER DAY AND NO MORE THAN 4 TABLETS PER WEEK     venlafaxine 37.5 MG 24 hr capsule  Commonly known as: EFFEXOR XR  Take 1 capsule (37.5 mg total) by mouth daily for 30 days, THEN 2 capsules (75 mg total) daily.  Start taking on: November 03, 2022     [facility] 93 mcg/actuation Aerb  Generic drug: fluticasone propionate  1 spray into each nostril two (2) times a day as needed.     XOLAIR 75 mg/0.5 mL syringe  Generic drug: omalizumab  Inject under the skin.     XOLAIR 150 mg/mL syringe  Generic drug: omalizumab  Inject 2 mL (300 mg total) under the skin every fourteen (14) days.              Plan: We reviewed the findings today.  The patient requested bilateral corticosteroid injections and this was performed today.  She will continue to work on strengthening of her hip and core musculature.  We will see her back as needed for this.  Procedures    No follow-ups on file.    Reed Pandy Sunnie Odden, CMA  Date: 12/16/2022  Time: 8:21 AM

## 2022-12-17 NOTE — Unmapped (Signed)
Voicemail message left by  lab- the urine specimen exceeded 24 hours hold time they are unable to do the urine culture. Please have patient redo lab test

## 2022-12-17 NOTE — Unmapped (Signed)
Injections have been scheduled, thank you. KA

## 2022-12-18 ENCOUNTER — Ambulatory Visit
Admit: 2022-12-18 | Discharge: 2022-12-19 | Payer: MEDICARE | Attending: Physician Assistant | Primary: Physician Assistant

## 2022-12-18 DIAGNOSIS — S46819A Strain of other muscles, fascia and tendons at shoulder and upper arm level, unspecified arm, initial encounter: Principal | ICD-10-CM

## 2022-12-18 MED ORDER — PREDNISONE 20 MG TABLET
ORAL_TABLET | 0 refills | 0 days | Status: CP
Start: 2022-12-18 — End: ?
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
## 2022-12-20 ENCOUNTER — Ambulatory Visit: Admit: 2022-12-20 | Discharge: 2022-12-21 | Payer: MEDICARE | Attending: Sports Medicine | Primary: Sports Medicine

## 2022-12-20 DIAGNOSIS — M67911 Unspecified disorder of synovium and tendon, right shoulder: Principal | ICD-10-CM

## 2022-12-20 DIAGNOSIS — M67912 Unspecified disorder of synovium and tendon, left shoulder: Principal | ICD-10-CM

## 2022-12-20 MED ADMIN — bupivacaine (PF) (MARCAINE) 0.5 % (5 mg/mL) injection (PF) 5 mL: 5 mL | INTRA_ARTICULAR | @ 20:00:00 | Stop: 2022-12-20

## 2022-12-20 MED ADMIN — triamcinolone acetonide (KENALOG) injection 20 mg: 20 mg | INTRA_ARTICULAR | @ 20:00:00 | Stop: 2022-12-20

## 2022-12-20 NOTE — Unmapped (Unsigned)
PATIENT: Natalie [name]      AGE: 52 y.o.     DOB: 10-Feb-1970    Medical Record Number:  [id number]            Date of Exam:  12/20/2022      Primary Care Provider:  Royal Hawthorn, MD       Chief Complaint   No chief complaint on file.       History Of Present Illness   Natalie [name] is a  52 y.o.  female who presents today for evaluation of bilateral shoulders. ***        Home Medications     Current Outpatient Medications:     albuterol HFA 90 mcg/actuation inhaler, Inhale 2 puffs every four (4) hours as needed for wheezing., Disp: 54 g, Rfl: 0    albuterol HFA 90 mcg/actuation inhaler, Inhale 2 puffs every six (6) hours as needed for wheezing., Disp: 54 g, Rfl: 0    albuterol HFA 90 mcg/actuation inhaler, Inhale 2 puffs every six (6) hours as needed for wheezing or shortness of breath., Disp: 54 g, Rfl: 0    amoxicillin-clavulanate (AUGMENTIN) 875-125 mg per tablet, Take 1 tablet by mouth two (2) times a day for 7 days., Disp: 14 tablet, Rfl: 0    azelastine (ASTELIN) 137 mcg (0.1 %) nasal spray, 2 sprays into each nostril two (2) times a day., Disp: 30 mL, Rfl: 5    azelastine (OPTIVAR) 0.05 % ophthalmic solution, Administer 2 drops to both eyes daily as needed., Disp: 6 mL, Rfl: 5    baclofen (LIORESAL) 20 MG tablet, 10 - 20 mg three times a day as needed for spasms., Disp: 90 tablet, Rfl: 2    cetirizine (ZYRTEC) 10 MG chewable tablet, Chew 1 tablet (10 mg total) at bedtime as needed for allergies., Disp: , Rfl:     diclofenac sodium (VOLTAREN) 1 % gel, Apply 2 g topically four (4) times a day., Disp: 100 g, Rfl: 6    diclofenac sodium 20 mg/gram /actuation(2 %) sopm, Apply 40 mg topically two (2) times a day., Disp: 1 g, Rfl: 0    empty container Misc, Use as directed to dispose of Xolair syringes, Disp: 1 each, Rfl: 2    EPINEPHrine (EPIPEN) 0.3 mg/0.3 mL injection, Inject 0.3 mL (0.3 mg total) into the muscle once as needed for anaphylaxis (Difficulty breathing, throat closing, etc) for up to 1 dose., Disp: 1 each, Rfl: 12    escitalopram oxalate (LEXAPRO) 10 MG tablet, 1/2 tab PO Daily for 6 days. If well tolerated, increase to 1 whole tablet PO daily., Disp: 30 tablet, Rfl: 1    estradiol (ESTRACE) 1 MG tablet, Take 1 tablet (1 mg total) by mouth daily., Disp: 90 tablet, Rfl: 3    famotidine (PEPCID) 20 MG tablet, Take 1 tablet (20 mg total) by mouth two (2) times a day., Disp: , Rfl:     gabapentin (NEURONTIN) 800 MG tablet, Take 1 tablet (800 mg total) by mouth two (2) times a day., Disp: 180 tablet, Rfl: 0    hydroCHLOROthiazide 12.5 MG tablet, TAKE 1 TABLET(12.5 MG) BY MOUTH DAILY AS NEEDED FOR SWELLING, Disp: 90 tablet, Rfl: 1    hydroxychloroquine (PLAQUENIL) 200 mg tablet, Take 1 tablet (200 mg total) by mouth two (2) times a day., Disp: 60 tablet, Rfl: 12    ibuprofen (MOTRIN) 800 MG tablet, Take 1 tablet (800 mg total) by mouth two (2) times a day as needed for  pain., Disp: 60 tablet, Rfl: 0    omalizumab (XOLAIR) 150 mg/mL syringe, Inject 2 mL (300 mg total) under the skin every fourteen (14) days., Disp: 4 mL, Rfl: 11    omalizumab (XOLAIR) 75 mg/0.5 mL syringe, Inject under the skin., Disp: , Rfl:     ondansetron (ZOFRAN) 4 MG tablet, Take 1 tablet (4 mg total) by mouth every eight (8) hours as needed for nausea (during a migraine)., Disp: 30 tablet, Rfl: 2    oxyCODONE-acetaminophen (PERCOCET) 10-325 mg per tablet, Take 1 tablet by mouth every four (4) hours as needed for pain. Max 4 tabs/day. Fill on or after: 10/04/22. Brand name only due to allergy., Disp: 120 tablet, Rfl: 0    oxyCODONE-acetaminophen (PERCOCET) 10-325 mg per tablet, Take 1 tablet by mouth every four (4) hours as needed for pain. Max 4 tabs/day. Fill on or after: 11/03/22. Brand name only due to allergy., Disp: 120 tablet, Rfl: 0    oxyCODONE-acetaminophen (PERCOCET) 10-325 mg per tablet, Take 1 tablet by mouth every four (4) hours as needed for pain. Max 4 tabs/day. Fill on or after: 12/03/22. Brand name only due to allergy., Disp: 120 tablet, Rfl: 0    pantoprazole (PROTONIX) 40 MG tablet, TAKE 1 TABLET(40 MG) BY MOUTH DAILY 30 MINUTES BEFORE BREAKFAST AS NEEDED, Disp: 30 tablet, Rfl: 5    potassium chloride 20 MEQ ER tablet, Take 1 tablet (20 mEq total) by mouth daily., Disp: 90 tablet, Rfl: 3    predniSONE (DELTASONE) 20 MG tablet, 2 p.o. daily for 2 days then 1 p.o. daily for 3 days and one half p.o. daily for 4 days, Disp: 9 tablet, Rfl: 0    PROCHAMBER Spcr, , Disp: , Rfl:     progesterone (PROMETRIUM) 100 MG capsule, Take 1 capsule (100 mg total) by mouth nightly., Disp: 90 capsule, Rfl: 3    rosuvastatin (CRESTOR) 10 MG tablet, Take 1 tablet (10 mg total) by mouth nightly., Disp: 90 tablet, Rfl: 3    triamcinolone (KENALOG) 0.1 % cream, Apply topically two (2) times a day as needed WHEN ALLERGIES FLARE-UP AND CAUSES RASH / ITCHING, Disp: 80 g, Rfl: 0    ubrogepant (UBRELVY) 50 mg tablet, TAKE 1 TABLET BY MOUTH FOR HEADACHE. MAY REPEAT IN 2 HOURS IF NEEDED. LIMIT 2 TABLETS PER DAY AND NO MORE THAN 4 TABLETS PER WEEK, Disp: 16 tablet, Rfl: 3    venlafaxine (EFFEXOR XR) 37.5 MG 24 hr capsule, Take 1 capsule (37.5 mg total) by mouth daily for 30 days, THEN 2 capsules (75 mg total) daily., Disp: 60 capsule, Rfl: 5    XHANCE 93 mcg/actuation AerB, 1 spray into each nostril two (2) times a day as needed., Disp: 16 mL, Rfl: 11    Current Facility-Administered Medications:     omalizumab Geoffry Paradise) injection 300 mg, 300 mg, Subcutaneous, Q28 Days, Volertas, Sofija Dalia, MD, 300 mg at 03/09/22 1441     Allergies   Buprenorphine hcl, Pregabalin, Buprenorphine, Cephalexin monohydrate, Doxycycline, Oxycodone hcl, Adhesive tape-silicones, Doxycycline hyclate (bulk), Keflex [cephalexin], Opioids - morphine analogues, and Oxycodone-acetaminophen    Medical History     Past Medical History:   Diagnosis Date    Allergic conjunctivitis 11/23/2018    Anemia 1986    Arthritis     Breast mass     Chondromalacia of left knee     Chondromalacia of right knee     Depressive disorder     Fibrocystic breast     GERD (gastroesophageal reflux  disease)     Hematuria     History of kidney stones     History of transfusion     Hyperlipidemia 1999    Hypothyroidism     Lumbar disc disease 1998    She was injured at work at the age of 29 and then had disk surgery 2 years later     Menopause ovarian failure     Menopause, premature     Migraine with aura     Neuromuscular disorder (CMS-HCC) 1999    Obesity (BMI 30-39.9) 11/23/2018    Ovarian cyst     Plantar fasciitis     Pseudotumor cerebri     Rash due to allergy 11/23/2018    Sickle cell trait (CMS-HCC)     Urinary tract infection     Vaginitis        Surgical History     Past Surgical History:   Procedure Laterality Date    BACK SURGERY  2001    BREAST BIOPSY Bilateral     When patient was 16 & 17 Both Negative    HYSTERECTOMY  2008    PR REMOVAL OF TONSILS,13+ Y/O Bilateral 10/10/2019    Procedure: TONSILLECTOMY;  Surgeon: Lona Millard, MD;  Location: OR [place];  Service: ENT    SALPINGOOPHORECTOMY Bilateral 2007    SPINE SURGERY  2001    TOTAL VAGINAL HYSTERECTOMY  01/04/2006    Uterine Prolapse-Dr. Leeanne Rio OP Note    TUBAL LIGATION  1995        Social History     Past Medical History:   Diagnosis Date    Allergic conjunctivitis 11/23/2018    Anemia 1986    Arthritis     Breast mass     Chondromalacia of left knee     Chondromalacia of right knee     Depressive disorder     Fibrocystic breast     GERD (gastroesophageal reflux disease)     Hematuria     History of kidney stones     History of transfusion     Hyperlipidemia 1999    Hypothyroidism     Lumbar disc disease 1998    She was injured at work at the age of 61 and then had disk surgery 2 years later     Menopause ovarian failure     Menopause, premature     Migraine with aura     Neuromuscular disorder (CMS-HCC) 1999    Obesity (BMI 30-39.9) 11/23/2018    Ovarian cyst     Plantar fasciitis     Pseudotumor cerebri     Rash due to allergy 11/23/2018    Sickle cell trait (CMS-HCC)     Urinary tract infection     Vaginitis         Family History     Family History   Problem Relation Age of Onset    Hypertension Mother     Heart disease Mother     Arthritis Mother     Depression Mother     Drug abuse Mother     Mental illness Mother     Ulcers Mother     COPD Mother     Heart attack Father     Mental illness Father     Asthma Father     Cancer Sister         lymphoma    Heart attack Maternal Grandmother     Heart disease Maternal Grandmother  Hypertension Maternal Grandmother     Thyroid disease Maternal Grandmother     Stroke Paternal Grandfather     Depression Son     Drug abuse Son     Mental illness Son     COPD Maternal Aunt     Heart attack Cousin 33    Breast cancer Cousin 32    Thyroid disease Other     Alcohol abuse Neg Hx      Family Status   Relation Name Status    Mother Annice Pih Alive    Father DJ Deceased    Sister Marvis Repress (Not Specified)    [employer]/textile technologist (Not Specified)    PGF HV (Not Specified)    Son Cloretta Ned (Not Specified)    Mat Aunt Amada Jupiter Deceased    Cousin Maquitta Deceased    Other  (Not Specified)    Neg Hx  (Not Specified)   No partnership data on file       Physical Exam   There were no vitals filed for this visit.     Appearance: Well nourished/well developed.  Psychiatric: Mood and affect appropriate.    Head:  Normocephalic, atraumatic.   Skin:  Clean, dry, no lesions, no rashes.  Neuro:  Alert and oriented.  Respiratory: Unlabored.       FOCUSED EXAM:  ***    Imaging   No results found.      Assessment & Plan     Diagnosis: No diagnosis found.      Prescription:      Your Medication List            Accurate as of December 20, 2022  7:49 AM. If you have any questions, ask your nurse or doctor.                CONTINUE taking these medications      albuterol 90 mcg/actuation inhaler  Commonly known as: PROVENTIL HFA;VENTOLIN HFA  Inhale 2 puffs every four (4) hours as needed for wheezing.     albuterol 90 mcg/actuation inhaler  Commonly known as: PROAIR HFA  Inhale 2 puffs every six (6) hours as needed for wheezing.     albuterol 90 mcg/actuation inhaler  Commonly known as: PROVENTIL HFA;VENTOLIN HFA  Inhale 2 puffs every six (6) hours as needed for wheezing or shortness of breath.     amoxicillin-clavulanate 875-125 mg per tablet  Commonly known as: AUGMENTIN  Take 1 tablet by mouth two (2) times a day for 7 days.     azelastine 0.05 % ophthalmic solution  Commonly known as: OPTIVAR  Administer 2 drops to both eyes daily as needed.     azelastine 137 mcg (0.1 %) nasal spray  Commonly known as: ASTELIN  2 sprays into each nostril two (2) times a day.     baclofen 20 MG tablet  Commonly known as: LIORESAL  10 - 20 mg three times a day as needed for spasms.     cetirizine 10 MG chewable tablet  Commonly known as: ZYRTEC  Chew 1 tablet (10 mg total) at bedtime as needed for allergies.     diclofenac sodium 1 % gel  Commonly known as: VOLTAREN  Apply 2 g topically four (4) times a day.     diclofenac sodium 20 mg/gram /actuation(2 %) Sopm  Apply 40 mg topically two (2) times a day.     empty container Misc  Use as directed to dispose of Xolair syringes     EPINEPHrine  0.3 mg/0.3 mL injection  Commonly known as: EPIPEN  Inject 0.3 mL (0.3 mg total) into the muscle once as needed for anaphylaxis (Difficulty breathing, throat closing, etc) for up to 1 dose.     escitalopram oxalate 10 MG tablet  Commonly known as: LEXAPRO  1/2 tab PO Daily for 6 days. If well tolerated, increase to 1 whole tablet PO daily.     estradiol 1 MG tablet  Commonly known as: ESTRACE  Take 1 tablet (1 mg total) by mouth daily.     famotidine 20 MG tablet  Commonly known as: PEPCID  Take 1 tablet (20 mg total) by mouth two (2) times a day.     gabapentin 800 MG tablet  Commonly known as: NEURONTIN  Take 1 tablet (800 mg total) by mouth two (2) times a day.     hydroCHLOROthiazide 12.5 MG tablet  TAKE 1 TABLET(12.5 MG) BY MOUTH DAILY AS NEEDED FOR SWELLING     hydroxychloroquine 200 mg tablet  Commonly known as: PLAQUENIL  Take 1 tablet (200 mg total) by mouth two (2) times a day.     ibuprofen 800 MG tablet  Commonly known as: MOTRIN  Take 1 tablet (800 mg total) by mouth two (2) times a day as needed for pain.     ondansetron 4 MG tablet  Commonly known as: ZOFRAN  Take 1 tablet (4 mg total) by mouth every eight (8) hours as needed for nausea (during a migraine).     oxyCODONE-acetaminophen 10-325 mg per tablet  Commonly known as: PERCOCET  Take 1 tablet by mouth every four (4) hours as needed for pain. Max 4 tabs/day. Fill on or after: 10/04/22. Brand name only due to allergy.     oxyCODONE-acetaminophen 10-325 mg per tablet  Commonly known as: PERCOCET  Take 1 tablet by mouth every four (4) hours as needed for pain. Max 4 tabs/day. Fill on or after: 11/03/22. Brand name only due to allergy.     oxyCODONE-acetaminophen 10-325 mg per tablet  Commonly known as: PERCOCET  Take 1 tablet by mouth every four (4) hours as needed for pain. Max 4 tabs/day. Fill on or after: 12/03/22. Brand name only due to allergy.     pantoprazole 40 MG tablet  Commonly known as: Protonix  TAKE 1 TABLET(40 MG) BY MOUTH DAILY 30 MINUTES BEFORE BREAKFAST AS NEEDED     potassium chloride 20 MEQ ER tablet  Take 1 tablet (20 mEq total) by mouth daily.     predniSONE 20 MG tablet  Commonly known as: DELTASONE  2 p.o. daily for 2 days then 1 p.o. daily for 3 days and one half p.o. daily for 4 days     PROCHAMBER Spcr  Generic drug: inhalational spacing device     progesterone 100 MG capsule  Commonly known as: PROMETRIUM  Take 1 capsule (100 mg total) by mouth nightly.     rosuvastatin 10 MG tablet  Commonly known as: CRESTOR  Take 1 tablet (10 mg total) by mouth nightly.     triamcinolone 0.1 % cream  Commonly known as: KENALOG  Apply topically two (2) times a day as needed WHEN ALLERGIES FLARE-UP AND CAUSES RASH / ITCHING     UBRELVY 50 mg tablet  Generic drug: ubrogepant  TAKE 1 TABLET BY MOUTH FOR HEADACHE. MAY REPEAT IN 2 HOURS IF NEEDED. LIMIT 2 TABLETS PER DAY AND NO MORE THAN 4 TABLETS PER WEEK     venlafaxine 37.5 MG 24 hr  capsule  Commonly known as: EFFEXOR XR  Take 1 capsule (37.5 mg total) by mouth daily for 30 days, THEN 2 capsules (75 mg total) daily.  Start taking on: November 03, 2022     [facility] 93 mcg/actuation Aerb  Generic drug: fluticasone propionate  1 spray into each nostril two (2) times a day as needed.     XOLAIR 75 mg/0.5 mL syringe  Generic drug: omalizumab  Inject under the skin.     XOLAIR 150 mg/mL syringe  Generic drug: omalizumab  Inject 2 mL (300 mg total) under the skin every fourteen (14) days.              Plan: ***    No follow-ups on file.    Reed Pandy Tryniti Laatsch, CMA  Date: 12/20/2022  Time: 7:49 AM
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
## 2022-12-21 ENCOUNTER — Institutional Professional Consult (permissible substitution): Admit: 2022-12-21 | Discharge: 2022-12-22 | Payer: MEDICARE

## 2022-12-21 DIAGNOSIS — M170 Bilateral primary osteoarthritis of knee: Principal | ICD-10-CM

## 2022-12-21 MED ADMIN — sodium hyaluronate (viscosup) (GELSYN-3) 16.8 mg/2 mL injection 16.8 mg: 16.8 mg | INTRA_ARTICULAR | @ 15:00:00 | Stop: 2022-12-21

## 2022-12-21 MED FILL — XOLAIR 150 MG/ML SUBCUTANEOUS SYRINGE: SUBCUTANEOUS | 28 days supply | Qty: 4 | Fill #1

## 2022-12-21 NOTE — Unmapped (Signed)
Left knee severe medial compartment osteoarthritis, Right knee osteoarthritis; Gelsyn injection #1/3 (Dr. Zenda Alpers) (left > right, last injection series 03/2022 and 07/2021 with good relief)     We discussed the Gelsyn injection series for the bilateral knee and the patient desired to proceed.     Procedure: After sterile prep, 2ml Gelsyn was injected into the left knee. The patient tolerated the procedure well.     Procedure: After sterile prep, 2ml Gelsyn was injected into the right knee. The patient tolerated the procedure well.     Follow-up in 1 week for the second bilateral knee Gelsyn injection.

## 2022-12-26 MED ORDER — BACLOFEN 20 MG TABLET
ORAL_TABLET | 2 refills | 0 days
Start: 2022-12-26 — End: ?

## 2022-12-26 MED ORDER — GABAPENTIN 800 MG TABLET
ORAL_TABLET | Freq: Two times a day (BID) | ORAL | 0 refills | 90 days
Start: 2022-12-26 — End: ?

## 2022-12-27 ENCOUNTER — Ambulatory Visit: Admit: 2022-12-27 | Discharge: 2022-12-28 | Payer: MEDICARE

## 2022-12-27 DIAGNOSIS — G894 Chronic pain syndrome: Principal | ICD-10-CM

## 2022-12-27 DIAGNOSIS — M47812 Spondylosis without myelopathy or radiculopathy, cervical region: Principal | ICD-10-CM

## 2022-12-27 DIAGNOSIS — M47816 Spondylosis without myelopathy or radiculopathy, lumbar region: Principal | ICD-10-CM

## 2022-12-27 DIAGNOSIS — M5417 Radiculopathy, lumbosacral region: Principal | ICD-10-CM

## 2022-12-27 DIAGNOSIS — M170 Bilateral primary osteoarthritis of knee: Principal | ICD-10-CM

## 2022-12-27 DIAGNOSIS — Z0289 Encounter for other administrative examinations: Principal | ICD-10-CM

## 2022-12-27 DIAGNOSIS — M7918 Myalgia, other site: Principal | ICD-10-CM

## 2022-12-27 MED ORDER — OXYCODONE-ACETAMINOPHEN 10 MG-325 MG TABLET
ORAL_TABLET | ORAL | 0 refills | 1 days | Status: CP | PRN
Start: 2022-12-27 — End: ?

## 2022-12-27 MED ORDER — GABAPENTIN 800 MG TABLET
ORAL_TABLET | Freq: Two times a day (BID) | ORAL | 0 refills | 90 days | Status: CP
Start: 2022-12-27 — End: ?

## 2022-12-27 MED ORDER — BACLOFEN 20 MG TABLET
ORAL_TABLET | 2 refills | 0 days | Status: CP
Start: 2022-12-27 — End: ?
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
## 2022-12-27 NOTE — Unmapped (Signed)
Medication:oxyCODONE-acetaminophen (PERCOCET) 10-325 mg per tablet   OZH:YQMV 1 tablet by mouth every four (4) hours as needed for pain. Max 4 tabs/day   Quantity on RX: #120  Filled on:12/02/22  Pill count today: # 8

## 2022-12-27 NOTE — Unmapped (Signed)
 It was good to see you today.    I have refilled your medications with no changes.    We will see you in 3 months, or sooner if needed.

## 2022-12-28 ENCOUNTER — Institutional Professional Consult (permissible substitution): Admit: 2022-12-28 | Discharge: 2022-12-29 | Payer: MEDICARE

## 2022-12-28 DIAGNOSIS — M170 Bilateral primary osteoarthritis of knee: Principal | ICD-10-CM

## 2022-12-28 MED ADMIN — sodium hyaluronate (viscosup) (GELSYN-3) 16.8 mg/2 mL injection 16.8 mg: 16.8 mg | INTRA_ARTICULAR | @ 20:00:00 | Stop: 2022-12-28

## 2022-12-28 MED ADMIN — sodium hyaluronate (viscosup) (GELSYN-3) 16.8 mg/2 mL injection 16.8 mg: 16.8 mg | INTRA_ARTICULAR | @ 19:00:00 | Stop: 2022-12-28

## 2022-12-28 NOTE — Unmapped (Signed)
Left knee severe medial compartment osteoarthritis, Right knee osteoarthritis; Gelsyn injection #2/3 (Dr. Zenda Alpers) (left > right, last injection series 03/2022 and 07/2021 with good relief) (improvement on the right knee with first injection)     We discussed the Gelsyn injection series for the bilateral knee and the patient desired to proceed.     Procedure: After sterile prep, 2ml Gelsyn was injected into the left knee. The patient tolerated the procedure well.     Procedure: After sterile prep, 2ml Gelsyn was injected into the right knee. The patient tolerated the procedure well.     Follow-up in 1 week for the third bilateral knee Gelsyn injection.

## 2022-12-31 DIAGNOSIS — M170 Bilateral primary osteoarthritis of knee: Principal | ICD-10-CM

## 2022-12-31 LAB — DRUG SCREEN, PAIN CLINIC, URINE
2-HYDROXY ETHYL FLURAZEPAM, UR: NOT DETECTED ng/mL
6-MONOACETYLMORPHINE, UR: NOT DETECTED ng/mL
7-AMINOCLONAZEPAM, UR: NOT DETECTED ng/mL
7-AMINOFLUNITRAZEPAM, UR: NOT DETECTED ng/mL
ALPHA-HYDROXY TRIAZOLAM, UR: NOT DETECTED ng/mL
ALPHA-HYDROXYALPRAZOLAM GLUCURONIDE, UR: NOT DETECTED ng/mL
ALPHA-HYDROXYALPRAZOLAM, UR: NOT DETECTED ng/mL
ALPRAZOLAM, UR: NOT DETECTED ng/mL
AMPHETAMINE, UR: NOT DETECTED ng/mL
BUPRENORPHINE, UR: NOT DETECTED ng/mL
CHLORDIAZEPOXIDE, UR: NOT DETECTED ng/mL
CLOBAZAM, UR: NOT DETECTED ng/mL
CLONAZEPAM, UR: NOT DETECTED ng/mL
COCAINE METABOLITES URINE: NEGATIVE ng/mL
CODEINE, UR: NOT DETECTED ng/mL
CODEINE-6-BETA-GLUCURONIDE, UR: NOT DETECTED ng/mL
COMMENT,URINE: NORMAL
CREATININE-O-ADULT: 121.1 mg/dL
DIAZEPAM, UR: NOT DETECTED ng/mL
DIHYDROCODEINE, UR: NOT DETECTED ng/mL
EDDP, UR: NOT DETECTED ng/mL
EPHEDRINE, UR: NOT DETECTED ng/mL
FLUNITRAZEPAM, UR: NOT DETECTED ng/mL
FLURAZEPAM, UR: NOT DETECTED ng/mL
HYDROCODONE, UR: NOT DETECTED ng/mL
HYDROMORPHONE, UR: NOT DETECTED ng/mL
HYDROMORPHONE-3-BETA-GLUCURONIDE, UR: NOT DETECTED ng/mL
LORAZEPAM GLUCURONIDE, UR: NOT DETECTED ng/mL
LORAZEPAM, UR: NOT DETECTED ng/mL
MDA, UR: NOT DETECTED ng/mL
MDEA, UR: NOT DETECTED ng/mL
MDMA, UR: NOT DETECTED ng/mL
MEPERIDINE, UR: NOT DETECTED ng/mL
METHADONE, UR: NOT DETECTED ng/mL
METHAMPHETAMINE, UR: NOT DETECTED ng/mL
METHYLPHENIDATE, UR: NOT DETECTED ng/mL
MIDAZOLAM, UR: NOT DETECTED ng/mL
MORPHINE, UR: NOT DETECTED ng/mL
MORPHINE-6-BETA-GLUCURONIDE, UR: NOT DETECTED ng/mL
N-DESMETHYLCLOBAZAM, UR: NOT DETECTED ng/mL
NALOXONE, UR: NOT DETECTED ng/mL
NORBUPRENOORPHINE GLUCURONIDE, UR: NOT DETECTED ng/mL
NORBUPRENORPHINE, UR: NOT DETECTED ng/mL
NORDIAZEPAM, UR: NOT DETECTED ng/mL
NORFENTANYL, UR: NOT DETECTED ng/mL
NORHYDROCODONE, UR: NOT DETECTED ng/mL
NORMEPERIDINE, UR: NOT DETECTED ng/mL
OXAZEPAM GLUCURONIDE, UR: NOT DETECTED ng/mL
OXAZEPAM, UR: NOT DETECTED ng/mL
OXIDANTS-O-ADULT: NEGATIVE
PH-O-ADULT: 5.7
PHENCYCLIDINE, UR: NOT DETECTED ng/mL
PHENTERMINE, UR: NOT DETECTED ng/mL
PRAZEPAM, UR: NOT DETECTED ng/mL
PROPOXYPHENE, UR: NOT DETECTED ng/mL
PSEUDOEPHEDRINE, UR: NOT DETECTED ng/mL
RITALINIC ACID, UR: NOT DETECTED ng/mL
SPECIFIC GRAVITY-O-ADULT: 1.022
TAPENTADOL, UR: NOT DETECTED ng/mL
TAPENTADOL-BETA-GLUCURONIDE, UR: NOT DETECTED ng/mL — AB
TEMAZEPAM GLUCURONIDE, UR: NOT DETECTED ng/mL
TEMAZEPAM, UR: NOT DETECTED ng/mL
THC, SCREEN URINE: NEGATIVE ng/mL
TRAMADOL, UR: NOT DETECTED ng/mL
TRIAZOLAM, UR: NOT DETECTED ng/mL
URINE BARBITURATES MAYO: NEGATIVE ng/mL
ZOLPIDEM PHENYL-4-CARBOXYLIC ACID, UR: NOT DETECTED ng/mL
ZOLPIDEM, UR: NOT DETECTED ng/mL

## 2022-12-31 MED ORDER — DICLOFENAC 20 MG/GRAM/ACTUATION (2 %) TOPICAL SOLN METERED-DOSE PUMP
0 refills | 0 days | Status: CP
Start: 2022-12-31 — End: ?

## 2022-12-31 NOTE — Unmapped (Signed)
Refill request

## 2023-01-02 MED ORDER — OXYCODONE-ACETAMINOPHEN 10 MG-325 MG TABLET
ORAL_TABLET | ORAL | 0 refills | 20.00000 days | Status: CP | PRN
Start: 2023-01-02 — End: ?

## 2023-01-04 ENCOUNTER — Ambulatory Visit: Admit: 2023-01-04 | Payer: MEDICARE

## 2023-01-04 NOTE — Unmapped (Unsigned)
Left knee severe medial compartment osteoarthritis, Right knee osteoarthritis; Gelsyn injection #3/3 (Dr. Zenda Alpers) (left > right, last injection series 03/2022 and 07/2021 with good relief) (improvement on the right knee with first injection)     We discussed the Gelsyn injection series for the bilateral knee and the patient desired to proceed.     Procedure: After sterile prep, 2ml Gelsyn was injected into the left knee. The patient tolerated the procedure well.     Procedure: After sterile prep, 2ml Gelsyn was injected into the right knee. The patient tolerated the procedure well.     Follow-up in 6 weeks as needed.

## 2023-01-07 ENCOUNTER — Telehealth: Admit: 2023-01-07 | Discharge: 2023-01-08 | Payer: MEDICARE | Attending: Psychologist | Primary: Psychologist

## 2023-01-07 DIAGNOSIS — F1190 Opioid use, unspecified, uncomplicated: Principal | ICD-10-CM

## 2023-01-07 DIAGNOSIS — F4321 Adjustment disorder with depressed mood: Principal | ICD-10-CM

## 2023-01-07 DIAGNOSIS — F419 Anxiety disorder, unspecified: Principal | ICD-10-CM
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
## 2023-01-11 ENCOUNTER — Institutional Professional Consult (permissible substitution): Admit: 2023-01-11 | Discharge: 2023-01-12 | Payer: MEDICARE

## 2023-01-11 DIAGNOSIS — M170 Bilateral primary osteoarthritis of knee: Principal | ICD-10-CM

## 2023-01-11 MED ADMIN — sodium hyaluronate (viscosup) (GELSYN-3) 16.8 mg/2 mL injection 16.8 mg: 16.8 mg | INTRA_ARTICULAR | @ 19:00:00 | Stop: 2023-01-11

## 2023-01-11 NOTE — Unmapped (Signed)
Left knee severe medial compartment osteoarthritis, Right knee osteoarthritis; Gelsyn injection #3/3 (Dr. Zenda Alpers) (left > right, last injection series 03/2022 and 07/2021 with good relief) (improvement on the right knee with first injections)     We discussed the Gelsyn injection series for the bilateral knee and the patient desired to proceed.     Procedure: After sterile prep, 2ml Gelsyn was injected into the left knee. The patient tolerated the procedure well.     Procedure: After sterile prep, 2ml Gelsyn was injected into the right knee. The patient tolerated the procedure well.     Follow-up in 6 weeks as needed.

## 2023-01-12 ENCOUNTER — Ambulatory Visit: Admit: 2023-01-12 | Discharge: 2023-01-13 | Payer: MEDICARE

## 2023-01-12 DIAGNOSIS — M549 Dorsalgia, unspecified: Principal | ICD-10-CM

## 2023-01-12 DIAGNOSIS — M542 Cervicalgia: Principal | ICD-10-CM
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
## 2023-01-14 DIAGNOSIS — N951 Menopausal and female climacteric states: Principal | ICD-10-CM

## 2023-01-14 MED ORDER — ESTRADIOL 0.5 MG TABLET
ORAL_TABLET | 1 refills | 0.00 days
Start: 2023-01-14 — End: ?

## 2023-01-17 MED ORDER — ESTRADIOL 0.5 MG TABLET
ORAL_TABLET | 1 refills | 0.00 days
Start: 2023-01-17 — End: ?

## 2023-01-17 NOTE — Unmapped (Signed)
The original prescription was discontinued on 07/16/2022 by Royal Hawthorn, MD for the following reason: Reorder. Renewing this prescription may not be appropriate.

## 2023-01-22 DIAGNOSIS — N951 Menopausal and female climacteric states: Principal | ICD-10-CM

## 2023-01-22 MED ORDER — ESCITALOPRAM 10 MG TABLET
ORAL_TABLET | 1 refills | 0.00 days
Start: 2023-01-22 — End: ?

## 2023-01-24 MED ORDER — ESCITALOPRAM 10 MG TABLET
ORAL_TABLET | Freq: Every day | ORAL | 4 refills | 30.00 days
Start: 2023-01-24 — End: ?

## 2023-01-24 NOTE — Unmapped (Signed)
She is also on Venlafaxine, both act somewhat similar, and using together could potentially result in Serotonin Syndrome.

## 2023-01-24 NOTE — Unmapped (Signed)
[facility] Refill Coordination Note    Specialty Medication(s) to be Shipped:   CF/Pulmonary/Asthma: Xolair 150mg /ml    Other medication(s) to be shipped: No additional medications requested for fill at this time     Natalie [name], DOB: [birth year]  Phone: [phone] (home)       All above HIPAA information was verified with patient.     Was a Nurse, learning disability used for this call? No    Completed refill call assessment today to schedule patient's medication shipment from the [facility] [phone]).  All relevant notes have been reviewed.     Specialty medication(s) and dose(s) confirmed: Regimen is correct and unchanged.   Changes to medications: Natalie [name] reports no changes at this time.  Changes to insurance: No  New side effects reported not previously addressed with a pharmacist or physician: None reported  Questions for the pharmacist: No    Confirmed patient received a Conservation officer, historic buildings and a Surveyor, mining with first shipment. The patient will receive a drug information handout for each medication shipped and additional FDA Medication Guides as required.       DISEASE/MEDICATION-SPECIFIC INFORMATION        For patients on injectable medications: Patient currently has 0 doses left.  Next injection is scheduled for 01/21/23.    SPECIALTY MEDICATION ADHERENCE     Medication Adherence    Patient reported X missed doses in the last month: 1  Specialty Medication: XOLAIR 150 mg/mL syringe (omalizumab)  Patient is on additional specialty medications: No              Were doses missed due to medication being on hold? No    XOLAIR 150 mg/mL syringe (omalizumab)  : 0 days of medicine on hand       REFERRAL TO PHARMACIST     Referral to the pharmacist: Not needed      [facility]     Shipping address confirmed in Epic.       Delivery Scheduled: Yes, Expected medication delivery date: 01/25/23.     Medication will be delivered via Same Day Courier to the prescription address in Epic WAM.    Dan Europe   [facility] Specialty Technician

## 2023-01-24 NOTE — Unmapped (Signed)
Sig: TAKE 1/2 TABLET BY MOUTH DAILY FOR 6 DAYS. IF WELL TOLERATED. INCREASE TO 1 TABLET BY MOUTH DAILY       Unable to complete refill request, please update sig.   Please advise on how to proceed.    Patient is requesting the following refill  Requested Prescriptions     Pending Prescriptions Disp Refills    escitalopram oxalate (LEXAPRO) 10 MG tablet [Pharmacy Med Name: ESCITALOPRAM 10MG  TABLETS] 30 tablet 1     Sig: TAKE 1/2 TABLET BY MOUTH DAILY FOR 6 DAYS. IF WELL TOLERATED. INCREASE TO 1 TABLET BY MOUTH DAILY       Recent Visits  Date Type Provider Dept   12/15/22 Office Visit Karyl Kinnier, [place] [facility]   10/07/22 Office Visit Neelagiri, Ardyth Man, MD [place] [facility]   09/14/22 Telemedicine Neelagiri, Ardyth Man, MD [place] [facility]   08/25/22 Office Visit Neelagiri, Ardyth Man, MD [place] [facility]   08/10/22 Office Visit Karyl Kinnier, [place] [facility]   07/16/22 Telemedicine Neelagiri, Ardyth Man, MD [place] [facility]   07/14/22 Telemedicine Tretha Sciara, [place] [facility]   06/08/22 Office Visit Neelagiri, Ardyth Man, MD [place] [facility]   05/07/22 Telemedicine Neelagiri, Ardyth Man, MD [place] [facility]   04/09/22 Office Visit Neelagiri, Ardyth Man, MD [place] [facility]   Showing recent visits within past 365 days and meeting all other requirements  Future Appointments  Date Type Provider Dept   02/07/23 Appointment Neelagiri, Ardyth Man, MD [place] [facility]   Showing future appointments within next 365 days and meeting all other requirements       Labs: PHQ9:   PHQ-9 PHQ-9 TOTAL SCORE   10/07/2022   9:45 AM 0

## 2023-01-25 MED FILL — XOLAIR 150 MG/ML SUBCUTANEOUS SYRINGE: SUBCUTANEOUS | 28 days supply | Qty: 4 | Fill #2

## 2023-01-25 NOTE — Unmapped (Signed)
Adult Refill: Patient is requesting the following refill  Requested Prescriptions      No prescriptions requested or ordered in this encounter     Escitalopram 10 mg last refilled 09/14/2022 with 30 tablets and 1 refill     Recent Visits  Date Type Provider Dept   12/15/22 Office Visit Karyl Kinnier, [place] [facility]   10/07/22 Office Visit Neelagiri, Ardyth Man, MD [place] [facility]   09/14/22 Telemedicine Neelagiri, Ardyth Man, [place] [facility]   08/25/22 Office Visit Neelagiri, Ardyth Man, [place] [facility]   08/10/22 Office Visit Karyl Kinnier, [place] [facility]   07/16/22 Telemedicine Neelagiri, Ardyth Man, [place] [facility]   07/14/22 Telemedicine Tretha Sciara, [place] [facility]   06/08/22 Office Visit Neelagiri, Ardyth Man, MD [place] [facility]   05/07/22 Telemedicine Neelagiri, Ardyth Man, MD [place] [facility]   04/09/22 Office Visit Neelagiri, Ardyth Man, MD [place] [facility]   Showing recent visits within past 365 days and meeting all other requirements  Future Appointments  Date Type Provider Dept   02/07/23 Appointment Neelagiri, Ardyth Man, MD [place] [facility]   Showing future appointments within next 365 days and meeting all other requirements       Labs: Pregnancy: No results found for: PREGPOC, PREGSERUM
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
## 2023-01-25 NOTE — Unmapped (Signed)
Spoke with the patient and informed her of Dr. Peter Minium note below and he is unable to fill the escitalopram. She verbalized understanding and will stop taking this medication.       Neelagiri, Ram, MD        Not able to refill escitalopram, as she has recently been started on venlafaxine by neurology, for headache prevention.    Both act similarly, and can't be taken together. So, am not refilling escitalopram.    Thanks!

## 2023-01-28 ENCOUNTER — Ambulatory Visit: Admit: 2023-01-28 | Discharge: 2023-01-29 | Payer: MEDICARE

## 2023-01-28 DIAGNOSIS — M47812 Spondylosis without myelopathy or radiculopathy, cervical region: Principal | ICD-10-CM

## 2023-01-28 DIAGNOSIS — M4316 Spondylolisthesis, lumbar region: Principal | ICD-10-CM

## 2023-01-28 DIAGNOSIS — M549 Dorsalgia, unspecified: Principal | ICD-10-CM

## 2023-01-28 DIAGNOSIS — Z981 Arthrodesis status: Principal | ICD-10-CM

## 2023-01-28 MED ORDER — METHYLPREDNISOLONE 4 MG TABLETS IN A DOSE PACK
0 refills | 0.00 days | Status: CP
Start: 2023-01-28 — End: ?

## 2023-01-29 DIAGNOSIS — G43E01 Chronic migraine with aura, not intractable, with status migrainosus: Principal | ICD-10-CM

## 2023-01-29 MED ORDER — UBRELVY 50 MG TABLET
ORAL_TABLET | 3 refills | 0.00 days
Start: 2023-01-29 — End: ?

## 2023-01-31 DIAGNOSIS — M170 Bilateral primary osteoarthritis of knee: Principal | ICD-10-CM

## 2023-01-31 DIAGNOSIS — E876 Hypokalemia: Principal | ICD-10-CM

## 2023-01-31 MED ORDER — UBRELVY 50 MG TABLET
ORAL_TABLET | 3 refills | 0 days | Status: CP
Start: 2023-01-31 — End: ?

## 2023-01-31 MED ORDER — DICLOFENAC 20 MG/GRAM/ACTUATION (2 %) TOPICAL SOLN METERED-DOSE PUMP
0 refills | 0.00 days
Start: 2023-01-31 — End: ?

## 2023-01-31 MED ORDER — POTASSIUM CHLORIDE ER 20 MEQ TABLET,EXTENDED RELEASE
ORAL_TABLET | Freq: Every day | ORAL | 5 refills | 0.00 days
Start: 2023-01-31 — End: ?

## 2023-02-01 DIAGNOSIS — R072 Precordial pain: Principal | ICD-10-CM

## 2023-02-01 MED ORDER — POTASSIUM CHLORIDE ER 20 MEQ TABLET,EXTENDED RELEASE
ORAL_TABLET | Freq: Every day | ORAL | 5 refills | 0.00 days
Start: 2023-02-01 — End: ?

## 2023-02-01 MED ORDER — OXYCODONE-ACETAMINOPHEN 10 MG-325 MG TABLET
ORAL_TABLET | ORAL | 0 refills | 20.00000 days | Status: CP | PRN
Start: 2023-02-01 — End: ?

## 2023-02-01 MED ORDER — PANTOPRAZOLE 40 MG TABLET,DELAYED RELEASE
ORAL_TABLET | 2 refills | 0.00 days | Status: CP
Start: 2023-02-01 — End: ?

## 2023-02-01 NOTE — Unmapped (Signed)
 This request was reviewed and met approval guidelines using the clinical decision support tools.

## 2023-02-01 NOTE — Unmapped (Signed)
The original prescription was discontinued on 07/23/2021 by Royal Hawthorn, MD for the following reason: Duplicate order. Renewing this prescription may not be appropriate.

## 2023-02-04 ENCOUNTER — Ambulatory Visit
Admit: 2023-02-04 | Payer: MEDICARE | Attending: Rehabilitative and Restorative Service Providers" | Primary: Rehabilitative and Restorative Service Providers"

## 2023-02-04 MED ORDER — DICLOFENAC 20 MG/GRAM/ACTUATION (2 %) TOPICAL SOLN METERED-DOSE PUMP
0 refills | 0.00 days
Start: 2023-02-04 — End: ?

## 2023-02-04 NOTE — Unmapped (Unsigned)
Assessment and Plan:     There are no diagnoses linked to this encounter.        Shingrix, and updated COVID-19 vaccines recommended.    Diagnosis and plan along with any newly prescribed medication(s) were discussed in detail with this patient today. The patient verbalized understanding and agreed with the plan without language barriers or behavioral barriers to understanding unless otherwise noted.     No follow-ups on file.     HPI:   Natalie [name]  is here for No chief complaint on file.      Takes hydrochlorothiazide 12.5 mg every 1 to 2 days.     Uses albuterol inhaler monthly as needed for shortness of breath, mostly on yard work.     {chrovchtm:39287}    PHQ-2 Score:      PHQ-9 Score:      [name] Score:      {select_status_or_delete_smartlist:64641}     ASCVD risk:  The 10-year ASCVD risk score (Arnett DK, et al., 2019) is: 3.8%    Values used to calculate the score:      Age: 53 years      Sex: Female      Is Non-Hispanic African American: Yes      Diabetic: No      Tobacco smoker: No      Systolic Blood Pressure: 130 mmHg      Is BP treated: Yes      HDL Cholesterol: 50 mg/dL      Total Cholesterol: 166 mg/dL    Note: For patients with SBP <90 or >200, Total Cholesterol <130 or >320, HDL <20 or >100 which are outside of the allowable range, the calculator will use these upper or lower values to calculate the patient???s risk score.    ROS:     All ROS negative unless otherwise noted in HPI.    Vital Signs:     Wt Readings from Last 3 Encounters:   01/28/23 73.5 kg (162 lb)   01/12/23 73.5 kg (162 lb)   12/27/22 75.6 kg (166 lb 11.2 oz)     Temp Readings from Last 3 Encounters:   12/18/22 36.7 ??C (98.1 ??F) (Tympanic)   12/15/22 36.6 ??C (97.8 ??F) (Temporal)   10/07/22 36.3 ??C (97.4 ??F) (Temporal)     BP Readings from Last 3 Encounters:   01/28/23 130/85   01/12/23 121/80   12/27/22 147/86     Pulse Readings from Last 3 Encounters:   01/28/23 71   01/12/23 79   12/27/22 90     There is no height or weight on file to calculate BMI.    Objective:     Physical Exam  Vitals and nursing note reviewed.   Constitutional:       General: She is not in acute distress.     Appearance: Normal appearance. She is not ill-appearing or diaphoretic.   Eyes:      General: No scleral icterus.     Conjunctiva/sclera: Conjunctivae normal.   Neck:      Thyroid: No thyroid mass, thyromegaly or thyroid tenderness.      Vascular: No carotid bruit or JVD.      Comments: No thyromegaly, no nodules or masses felt on exam.  Cardiovascular:      Rate and Rhythm: Normal rate and regular rhythm.      Pulses: Normal pulses.      Heart sounds: Normal heart sounds. No murmur heard.  Pulmonary:  Effort: Pulmonary effort is normal. No respiratory distress.      Breath sounds: Normal breath sounds. No wheezing.   Musculoskeletal:      Right lower leg: No edema.      Left lower leg: No edema.   Lymphadenopathy:      Cervical: No cervical adenopathy.   Neurological:      Mental Status: She is alert and oriented to person, place, and time.   Psychiatric:         Mood and Affect: Mood normal.         Behavior: Behavior normal.         Thought Content: Thought content normal.         Judgment: Judgment normal.       POC Testing and Recent Labs:     Results for orders placed or performed in visit on 12/27/22   Drug Screen, [facility], Urine   Result Value Ref Range    Current Medications Not provided     Creatinine-Adulterants 121.1 mg/dL    Specific Gravity-Adulterants 1.022     Ph-Adultertants 5.7     Oxidants-Adulterants Negative Cutoff: 200 mg/L    Comment, Urine Normal     Urine Barbiturate Screen Negative Cutoff: 200 ng/mL    Cocaine Metabolites, Ur Negative Cutoff: 150 ng/mL    THC, Screen Urine Negative Cutoff: 50 ng/mL    Codeine, Ur Not Detected Cutoff: 25 ng/mL    Codeine-6-beta-glucuronide, Ur Not Detected Cutoff: 100 ng/mL    Morphine, Ur Not Detected Cutoff: 25 ng/mL    Morphine-6-beta-glucuronide, Ur Not Detected Cutoff: 100 ng/mL    6-monoacetylmorphine, Ur Not Detected Cutoff: 25 ng/mL    Hydrocodone, Ur Not Detected Cutoff: 25 ng/mL    Norhydrocodone, Ur Not Detected Cutoff: 25 ng/mL    Dihydrocodeine, Ur Not Detected Cutoff: 25 ng/mL    Hydromorphone, Ur Not Detected Cutoff: 25 ng/mL    Hydromorphone-3-beta-glucuronide, Ur Not Detected Cutoff: 100 ng/mL    Oxycodone, Ur Present (A) Cutoff: 25 ng/mL    Noroxycodone, Ur Present (A) Cutoff: 25 ng/mL    Oxymorphone, Ur Present (A) Cutoff: 25 ng/mL    Oxymorphone-3-beta-glucuronide, Ur Present (A) Cutoff: 100 ng/mL    Noroxymorphone, Ur Present (A) Cutoff: 25 ng/mL    Fentanyl, Ur Not Detected Cutoff: 2 ng/mL    Norfentanyl, Ur Not Detected Cutoff: 2 ng/mL    Meperidine, Ur Not Detected Cutoff: 25 ng/mL    Normeperidine, Ur Not Detected Cutoff: 25 ng/mL    Naloxone, Ur Not Detected Cutoff: 25 ng/mL    Naloxone-3-beta-glucuronide, Ur Not Detected Cutoff: 100 ng/mL    Methadone, Ur Not Detected Cutoff: 25 ng/mL    EDDP, Ur Not Detected Cutoff: 25 ng/mL    Propoxyphene, Ur Not Detected Cutoff: 25 ng/mL    Norpropoxyphene, Ur Not Detected Cutoff: 25 ng/mL    Tramadol, Ur Not Detected Cutoff: 25 ng/mL    O-desmethyltramadol, Ur Not Detected Cutoff: 25 ng/mL    Tapentadol, Ur Not Detected Cutoff: 25 ng/mL    N-desmethyltapentadol, Ur Not Detected Cutoff: 50 ng/mL    Tapentadol-beta-glucuronide, Ur Not Detected Cutoff: 100 ng/mL    Buprenorphine, Ur Not Detected Cutoff: 5 ng/mL    Norbuprenorphine, Ur Not Detected Cutoff: 5 ng/mL    Norbuprenorphine glucuronide, Ur Not Detected Cutoff: 20 ng/mL    Opioid Interpretation, Ur SEE COMMENTS     Alprazolam, Ur Not Detected Cutoff: 10 ng/mL    Alpha-Hydroxyalprazolam, Ur Not Detected Cutoff: 10 ng/mL  Alpha-Hydroxyalprazolam Glucuronide, Ur Not Detected Cutoff: 50 ng/mL    Chlordiazepoxide, Ur Not Detected Cutoff: 10 ng/mL    Clobazam, Ur Not Detected Cutoff: 10 ng/mL    N-Desmethylclobazam, Ur Not Detected Cutoff: 200 ng/mL    Clonazepam, Ur Not Detected Cutoff: 10 ng/mL    7-aminoclonazepam, Ur Not Detected Cutoff: 10 ng/mL    Diazepam, Ur Not Detected Cutoff: 10 ng/mL    Nordiazepam, Ur Not Detected Cutoff: 10 ng/mL    Flunitrazepam, Ur Not Detected Cutoff: 10 ng/mL    7-aminoflunitrazepam, Ur Not Detected Cutoff: 10 ng/mL    Flurazepam, Ur Not Detected Cutoff: 10 ng/mL    2-Hydroxy Ethyl Flurazepam, Ur Not Detected Cutoff: 10 ng/mL    Lorazepam, Ur Not Detected Cutoff: 10 ng/mL    Lorazepam Glucuronide, Ur Not Detected Cutoff: 50 ng/mL    Midazolam, Ur Not Detected Cutoff: 10 ng/mL    Alpha-Hydroxy Midazolam, Ur Not Detected Cutoff: 10 ng/mL    Oxazepam, Ur Not Detected Cutoff: 10 ng/mL    Oxazepam Glucuronide, Ur Not Detected Cutoff: 50 ng/mL    Prazepam, Ur Not Detected Cutoff: 10 ng/mL    Temazepam, Ur Not Detected Cutoff: 10 ng/mL    Temazepam Glucuronide, Ur Not Detected Cutoff: 50 ng/mL    Triazolam, Ur Not Detected Cutoff: 10 ng/mL    Alpha-Hydroxy Triazolam, Ur Not Detected Cutoff: 10 ng/mL    Zolpidem, Ur Not Detected Cutoff: 10 ng/mL    Zolpidem Phenyl-4-Carboxylic acid, Ur Not Detected Cutoff: 10 ng/mL    Benzodiazepine Interpretation SEE COMMENTS     Methamphetamine, Ur Not Detected Cutoff: 100 ng/mL    Amphetamine, Ur Not Detected Cutoff: 100 ng/mL    MDMA, Ur Not Detected Cutoff: 100 ng/mL    MDEA, Ur Not Detected Cutoff: 100 ng/mL    MDA, Ur Not Detected Cutoff: 100 ng/mL    Ephedrine, Ur Not Detected Cutoff: 100 ng/mL    Pseudoephedrine, Ur Not Detected Cutoff: 100 ng/mL    Phentermine, Ur Not Detected Cutoff: 100 ng/mL    Phencyclidine, Ur Not Detected Cutoff: 20 ng/mL    Methylphenidate, Ur Not Detected Cutoff: 20 ng/mL    Ritalinic Acid, Ur Not Detected Cutoff: 100 ng/mL    Stimulant Interpretation SEE COMMENTS      *Note: Due to a large number of results and/or encounters for the requested time period, some results have not been displayed. A complete set of results can be found in Results Review.       {[facility] - [id number]    I attest that I, Rayetta Humphrey, personally documented this note while acting as scribe for Royal Hawthorn, MD.      Rayetta Humphrey, Scribe.  02/07/2023     The documentation recorded by the scribe accurately reflects the service I personally performed and the decisions made by me.    Royal Hawthorn, MD

## 2023-02-07 MED FILL — ROSUVASTATIN 10 MG TABLET: ORAL | 90 days supply | Qty: 90 | Fill #2
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
## 2023-02-08 NOTE — Unmapped (Unsigned)
Assessment and Plan:     There are no diagnoses linked to this encounter.        Shingrix, and updated COVID-19 vaccines recommended.    Diagnosis and plan along with any newly prescribed medication(s) were discussed in detail with this patient today. The patient verbalized understanding and agreed with the plan without language barriers or behavioral barriers to understanding unless otherwise noted.     No follow-ups on file.     HPI:   Natalie [name]  is here for No chief complaint on file.      Takes hydrochlorothiazide 12.5 mg every 1 to 2 days.     Uses albuterol inhaler monthly as needed for shortness of breath, mostly on yard work.     {chrovchtm:39287}    PHQ-2 Score:      PHQ-9 Score:      [name] Score:      {select_status_or_delete_smartlist:64641}     ASCVD risk:  The 10-year ASCVD risk score (Arnett DK, et al., 2019) is: 3.8%    Values used to calculate the score:      Age: 53 years      Sex: Female      Is Non-Hispanic African American: Yes      Diabetic: No      Tobacco smoker: No      Systolic Blood Pressure: 130 mmHg      Is BP treated: Yes      HDL Cholesterol: 50 mg/dL      Total Cholesterol: 166 mg/dL    Note: For patients with SBP <90 or >200, Total Cholesterol <130 or >320, HDL <20 or >100 which are outside of the allowable range, the calculator will use these upper or lower values to calculate the patient???s risk score.    ROS:     All ROS negative unless otherwise noted in HPI.    Vital Signs:     Wt Readings from Last 3 Encounters:   01/28/23 73.5 kg (162 lb)   01/12/23 73.5 kg (162 lb)   12/27/22 75.6 kg (166 lb 11.2 oz)     Temp Readings from Last 3 Encounters:   12/18/22 36.7 ??C (98.1 ??F) (Tympanic)   12/15/22 36.6 ??C (97.8 ??F) (Temporal)   10/07/22 36.3 ??C (97.4 ??F) (Temporal)     BP Readings from Last 3 Encounters:   01/28/23 130/85   01/12/23 121/80   12/27/22 147/86     Pulse Readings from Last 3 Encounters:   01/28/23 71   01/12/23 79   12/27/22 90     There is no height or weight on file to calculate BMI.    Objective:     Physical Exam  Vitals and nursing note reviewed.   Constitutional:       General: She is not in acute distress.     Appearance: Normal appearance. She is not ill-appearing or diaphoretic.   Eyes:      General: No scleral icterus.     Conjunctiva/sclera: Conjunctivae normal.   Neck:      Thyroid: No thyroid mass, thyromegaly or thyroid tenderness.      Vascular: No carotid bruit or JVD.      Comments: No thyromegaly, no nodules or masses felt on exam.  Cardiovascular:      Rate and Rhythm: Normal rate and regular rhythm.      Pulses: Normal pulses.      Heart sounds: Normal heart sounds. No murmur heard.  Pulmonary:  Effort: Pulmonary effort is normal. No respiratory distress.      Breath sounds: Normal breath sounds. No wheezing.   Musculoskeletal:      Right lower leg: No edema.      Left lower leg: No edema.   Lymphadenopathy:      Cervical: No cervical adenopathy.   Neurological:      Mental Status: She is alert and oriented to person, place, and time.   Psychiatric:         Mood and Affect: Mood normal.         Behavior: Behavior normal.         Thought Content: Thought content normal.         Judgment: Judgment normal.       POC Testing and Recent Labs:     Results for orders placed or performed in visit on 12/27/22   Drug Screen, [facility], Urine   Result Value Ref Range    Current Medications Not provided     Creatinine-Adulterants 121.1 mg/dL    Specific Gravity-Adulterants 1.022     Ph-Adultertants 5.7     Oxidants-Adulterants Negative Cutoff: 200 mg/L    Comment, Urine Normal     Urine Barbiturate Screen Negative Cutoff: 200 ng/mL    Cocaine Metabolites, Ur Negative Cutoff: 150 ng/mL    THC, Screen Urine Negative Cutoff: 50 ng/mL    Codeine, Ur Not Detected Cutoff: 25 ng/mL    Codeine-6-beta-glucuronide, Ur Not Detected Cutoff: 100 ng/mL    Morphine, Ur Not Detected Cutoff: 25 ng/mL    Morphine-6-beta-glucuronide, Ur Not Detected Cutoff: 100 ng/mL    6-monoacetylmorphine, Ur Not Detected Cutoff: 25 ng/mL    Hydrocodone, Ur Not Detected Cutoff: 25 ng/mL    Norhydrocodone, Ur Not Detected Cutoff: 25 ng/mL    Dihydrocodeine, Ur Not Detected Cutoff: 25 ng/mL    Hydromorphone, Ur Not Detected Cutoff: 25 ng/mL    Hydromorphone-3-beta-glucuronide, Ur Not Detected Cutoff: 100 ng/mL    Oxycodone, Ur Present (A) Cutoff: 25 ng/mL    Noroxycodone, Ur Present (A) Cutoff: 25 ng/mL    Oxymorphone, Ur Present (A) Cutoff: 25 ng/mL    Oxymorphone-3-beta-glucuronide, Ur Present (A) Cutoff: 100 ng/mL    Noroxymorphone, Ur Present (A) Cutoff: 25 ng/mL    Fentanyl, Ur Not Detected Cutoff: 2 ng/mL    Norfentanyl, Ur Not Detected Cutoff: 2 ng/mL    Meperidine, Ur Not Detected Cutoff: 25 ng/mL    Normeperidine, Ur Not Detected Cutoff: 25 ng/mL    Naloxone, Ur Not Detected Cutoff: 25 ng/mL    Naloxone-3-beta-glucuronide, Ur Not Detected Cutoff: 100 ng/mL    Methadone, Ur Not Detected Cutoff: 25 ng/mL    EDDP, Ur Not Detected Cutoff: 25 ng/mL    Propoxyphene, Ur Not Detected Cutoff: 25 ng/mL    Norpropoxyphene, Ur Not Detected Cutoff: 25 ng/mL    Tramadol, Ur Not Detected Cutoff: 25 ng/mL    O-desmethyltramadol, Ur Not Detected Cutoff: 25 ng/mL    Tapentadol, Ur Not Detected Cutoff: 25 ng/mL    N-desmethyltapentadol, Ur Not Detected Cutoff: 50 ng/mL    Tapentadol-beta-glucuronide, Ur Not Detected Cutoff: 100 ng/mL    Buprenorphine, Ur Not Detected Cutoff: 5 ng/mL    Norbuprenorphine, Ur Not Detected Cutoff: 5 ng/mL    Norbuprenorphine glucuronide, Ur Not Detected Cutoff: 20 ng/mL    Opioid Interpretation, Ur SEE COMMENTS     Alprazolam, Ur Not Detected Cutoff: 10 ng/mL    Alpha-Hydroxyalprazolam, Ur Not Detected Cutoff: 10 ng/mL  Alpha-Hydroxyalprazolam Glucuronide, Ur Not Detected Cutoff: 50 ng/mL    Chlordiazepoxide, Ur Not Detected Cutoff: 10 ng/mL    Clobazam, Ur Not Detected Cutoff: 10 ng/mL    N-Desmethylclobazam, Ur Not Detected Cutoff: 200 ng/mL    Clonazepam, Ur Not Detected Cutoff: 10 ng/mL    7-aminoclonazepam, Ur Not Detected Cutoff: 10 ng/mL    Diazepam, Ur Not Detected Cutoff: 10 ng/mL    Nordiazepam, Ur Not Detected Cutoff: 10 ng/mL    Flunitrazepam, Ur Not Detected Cutoff: 10 ng/mL    7-aminoflunitrazepam, Ur Not Detected Cutoff: 10 ng/mL    Flurazepam, Ur Not Detected Cutoff: 10 ng/mL    2-Hydroxy Ethyl Flurazepam, Ur Not Detected Cutoff: 10 ng/mL    Lorazepam, Ur Not Detected Cutoff: 10 ng/mL    Lorazepam Glucuronide, Ur Not Detected Cutoff: 50 ng/mL    Midazolam, Ur Not Detected Cutoff: 10 ng/mL    Alpha-Hydroxy Midazolam, Ur Not Detected Cutoff: 10 ng/mL    Oxazepam, Ur Not Detected Cutoff: 10 ng/mL    Oxazepam Glucuronide, Ur Not Detected Cutoff: 50 ng/mL    Prazepam, Ur Not Detected Cutoff: 10 ng/mL    Temazepam, Ur Not Detected Cutoff: 10 ng/mL    Temazepam Glucuronide, Ur Not Detected Cutoff: 50 ng/mL    Triazolam, Ur Not Detected Cutoff: 10 ng/mL    Alpha-Hydroxy Triazolam, Ur Not Detected Cutoff: 10 ng/mL    Zolpidem, Ur Not Detected Cutoff: 10 ng/mL    Zolpidem Phenyl-4-Carboxylic acid, Ur Not Detected Cutoff: 10 ng/mL    Benzodiazepine Interpretation SEE COMMENTS     Methamphetamine, Ur Not Detected Cutoff: 100 ng/mL    Amphetamine, Ur Not Detected Cutoff: 100 ng/mL    MDMA, Ur Not Detected Cutoff: 100 ng/mL    MDEA, Ur Not Detected Cutoff: 100 ng/mL    MDA, Ur Not Detected Cutoff: 100 ng/mL    Ephedrine, Ur Not Detected Cutoff: 100 ng/mL    Pseudoephedrine, Ur Not Detected Cutoff: 100 ng/mL    Phentermine, Ur Not Detected Cutoff: 100 ng/mL    Phencyclidine, Ur Not Detected Cutoff: 20 ng/mL    Methylphenidate, Ur Not Detected Cutoff: 20 ng/mL    Ritalinic Acid, Ur Not Detected Cutoff: 100 ng/mL    Stimulant Interpretation SEE COMMENTS      *Note: Due to a large number of results and/or encounters for the requested time period, some results have not been displayed. A complete set of results can be found in Results Review.       {[facility] - [id number]    I attest that I, Rayetta Humphrey, personally documented this note while acting as scribe for Royal Hawthorn, MD.      Rayetta Humphrey, Scribe.  02/08/2023     The documentation recorded by the scribe accurately reflects the service I personally performed and the decisions made by me.    Royal Hawthorn, MD

## 2023-02-09 ENCOUNTER — Ambulatory Visit: Admit: 2023-02-09 | Discharge: 2023-02-09 | Payer: MEDICARE

## 2023-02-09 ENCOUNTER — Encounter: Admit: 2023-02-09 | Discharge: 2023-02-09 | Payer: MEDICARE | Attending: Psychologist | Primary: Psychologist

## 2023-02-09 DIAGNOSIS — F4321 Adjustment disorder with depressed mood: Principal | ICD-10-CM

## 2023-02-09 DIAGNOSIS — F1190 Opioid use, unspecified, uncomplicated: Principal | ICD-10-CM

## 2023-02-09 DIAGNOSIS — F419 Anxiety disorder, unspecified: Principal | ICD-10-CM

## 2023-02-09 NOTE — Unmapped (Signed)
[facility]   Confidential Psychological Therapy Session    Patient Name: Jahmiyah Fratello  Medical Record Number: [id number]  Date of Service: February 09, 2023  Attending Psychologist: Caroline More, PhD  CPT Procedure Code: 15176 for 45 minutes of face to face counseling    This visit was performed face to face with interactive technology using a HIPPA compliant audio/visual platform. We reviewed confidentiality today. The patient was present in [place], a state in which this provider is licensed and able to provide care (location and contact information confirmed), attended this visit alone, and consented to this virtual pain psychology visit.    REFERRING PHYSICIAN: Criss Rosales, MD    CHIEF COMPLAINT AND REASON FOR VISIT:  COM follow up evaluation/pain coping skills; CBT/ACT for pain; grief    SUBJECTIVE / HISTORY OF PRESENT ILLNESS: Ms.  Fliegel is a very pleasant 53 y.o.  female with chronic lower back pain, chronic headaches, and myofascial pain. Her pain started in 1999 in her lower back.  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  She had a spinal fusion in 2001 at the L5-S1 level.  She has had bilateral shoulder pain, neck pain, lower back pain, and b/l knees.  She also described a radiculopathy b/l that goes from her back to her bilateral buttocks and down to her toes.      Pt initially established with Dr. Oda Kilts in 08/2016 and then was was first evaluated by me in 07/2017. Last follow up with me was 01/07/23. Pt is working 3 jobs now. Is thinking about starting her own business in exercise and health (apparently bf has a business so is going to look into how she can learn from him. Pt reviewed goals and values and activities and relationships that contribute to her health and happiness. Patient reports good adherence to COM (Percocet). UTS UTD (most recent 12/27/22 was appropriate).  Patient shares that she has had some increased acute knee pain and she is not sure what this is from.  She believes this may be due to some of the sudden barometric pressure changes.  She expects it to resolve/return to baseline.    Spent extra time processing stressors related to her son and his MH, and how she feels impacted by the stressful relationship. Provided MH referral for son, per her request, again, and discussed boundary setting (if some technically safe, not to put to much emotional time and energy into the relationship, if it is not working for [her].      OBJECTIVE / MENTAL STATUS:    Appearance:   Appears stated age and Clean/Neat   Motor:  No abnormal movements   Speech/Language:   Normal rate, volume, tone, fluency   Mood:  Irritable   Affect:  Blunted   Thought process:  Logical, linear, clear, coherent, goal directed   Thought content:    Denies SI, HI, self harm, delusions, obsessions, paranoid ideation, or ideas of reference   Perceptual disturbances:    Denies auditory and visual hallucinations, behavior not concerning for response to internal stimuli   Orientation:  Oriented to person, place, time, and general circumstances   Attention:  Able to fully attend without fluctuations in consciousness   Concentration:  Able to fully concentrate and attend   Memory:  Immediate, short-term, long-term, and recall grossly intact    Fund of knowledge:   Consistent with level of education and development   Insight:  Fair   Judgment:   Intact   Impulse Control:  Intact       DIAGNOSTIC IMPRESSION:   Anxiety NOS  Chronic continuou use of opiates  Grief    ASSESSMENT:   Ms.  Rolstad is a very pleasant 53 y.o.  female from [place], [place] with chronic lower back pain, chronic headaches, and myofascial pain. Her pain started in 1999 in her lower back.  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  She had a spinal fusion in 2001 at the L5-S1 level.  She has had bilateral shoulder pain, neck pain, lower back pain, and b/l knees.  She also described a radiculopathy b/l that goes from her back to her bilateral buttocks and down to her toes.  The patient is currently considered to be high risk due to prior nonadherence, but appropriate with a behavioral adherence plan in place.  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 She continues to participate actively in psychotherapy.    PLAN:   (1) COM - high risk but remains appropriate with a behavioral adherence plan also in place.    -Patient MUST meet with pain psychology/me once a month.   -No additional overuse of opioids will be tolerated.    -Patient should always bring her medication to clinic for pill counts.  -Patient was informed she has had numerous infractions with respect to her pain contract over the years, and that no further infractions will be tolerated.    If any additional pain contract or behavioral adherence contract infractions occur, I recommend stopping opioids.    Pt doing well with adherence, per our last few visits.     (2) Pain coping skills- addressed compliance, as well as pacing and mindful breathing, body scan, and problem solving    (3) follow-in 1 month    (4) MH resources provided for her son, per her request     [facility]  Phone: [phone]  Fax: [phone]  Crisis Line: [phone]

## 2023-02-10 DIAGNOSIS — R600 Localized edema: Principal | ICD-10-CM

## 2023-02-10 MED ORDER — HYDROCHLOROTHIAZIDE 12.5 MG TABLET
ORAL_TABLET | 3 refills | 0.00 days | Status: CP
Start: 2023-02-10 — End: ?

## 2023-02-16 ENCOUNTER — Ambulatory Visit: Admit: 2023-02-16 | Discharge: 2023-02-17 | Payer: MEDICARE

## 2023-02-16 DIAGNOSIS — M7918 Myalgia, other site: Principal | ICD-10-CM

## 2023-02-16 NOTE — Unmapped (Addendum)
It was good to see you today.    We will get your scheduled for TPIs for your neck pain     Continue Voltaren gel     Follow up with PT

## 2023-02-16 NOTE — Unmapped (Unsigned)
Trigger Point Injection of 3+ muscle groups: Bilateral cervical paraspinals, bilateral trapezius, rhomboids, and levator scapulae  Pre-operative diagnosis: myalgia  Post-operative diagnosis: Same    Attending Physician: Dr. Criss Rosales  Assistant: Dr. Susy Manor     The patient is a 53 y.o. female with past medical history of pseudotumor cerebri, bilateral knee osteoarthritis, myofascial pain syndrome, L5-S1 spinal fusion in 2001, coccydynia. She presents today for treatment of her myofascial paracervical, shoulders, upper mid and lower back areas with repeat trigger point injections     Last TPIs were completed on 08/31/22 ***    Reports significant benefit from continuing on a regular TPI regimen. 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     Has serial consent from 03/11/2022 ***    Repeat TPI must document:     - Pt with focal area of pain in skeletal muscle: neck, upper back   - Exam reveals hyperirritable spot, taut band identified by palpation, and nodule of muscle fiber harder than normal consistency   - Pt has failed non-invasive conservative therapy  - Pt involved in ongoing conservative treatment program including HEP  - Pt with previous TPI on 08/31/22 with pain relief of ***  - Pt noted functional improvement after last TPI of >50%  - Now with recurrence of myofascial pain resulting functional limitations including: household chores and cooking, standing sink   - Dates of previous TPIs in 12 month rolling period:  08/31/22(no more than 3)  -FMAS score prior to TPI: is not available since the change in insurance requirements have changed in the middle of her treatment plan and she has received several trigger points in the past.   -FMAS score following TPI: 2. Interpretation: 0-10: Minimal impact on function       After risks and benefits were explained including bleeding, infection, worsening of the pain, damage to the area being injected, weakness, allergic reaction to medications, vascular injection, pneumothorax and nerve damage, signed consent was obtained.  All questions were answered.   The patient is not taking antiplatelet or anticoagulation medications and does not have a driver today.    The area of the trigger point was identified and the skin prepped with chlorhexidine.  Next, a 27 gauge 0.5 inch needle was placed in the area of the trigger point.  Dry needling was performed in the area of the trigger point.  Once reproduction of the pain was elicited and negative aspiration confirmed, the trigger point was injected with 0.5-32ml of .25% bupivacaine and the needle removed.      The patient did tolerate the procedure well and there were not complications.      Trigger points injected:  20   ***    Trigger point locations:  Bilateral cervical paraspinals, bilateral trapezius, rhomboids, and levator scapulae    The patient was monitored for 15 minutes after the procedure.  Vital signs remained normal and the patient ambulated out of clinic    Pre-procedure pain score: ***/10  Post-procedure pain score: ***/10    DISPO:  ***    Requested Prescriptions      No prescriptions requested or ordered in this encounter     No orders of the defined types were placed in this encounter.

## 2023-02-16 NOTE — Unmapped (Signed)
Medication:oxyCODONE-acetaminophen (PERCOCET) 10-325 mg per tablet   SIG:PT DID NOT BRING MEDICATION TO COUNT/DOCUMENT  Quantity on RX: #  Filled on:  Pill count today: #
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
## 2023-02-17 ENCOUNTER — Ambulatory Visit: Admit: 2023-02-17 | Payer: MEDICARE | Attending: Anesthesiology | Primary: Anesthesiology

## 2023-02-17 NOTE — Unmapped (Signed)
Pre-procedure call. Left message regarding appointment time, arrival time, and need for a driver. Advised patient to call [phone] if experiencing Covid symptoms, tested positive for Covid in the past 10 days, or taking antibiotics for an infection. Also notified patient to call the same phone number to speak with a nurse for pre-procedure instructions.   02/17/23 1559   Pre-op Phone Call   Surgery Time Verified No   Expected Arrival Time 0845   Arrival Time Verified No   Surgery Location Verified No   Medical History Reviewed No

## 2023-02-17 NOTE — Unmapped (Incomplete)
POST PROCEDURE INSTRUCTIONS   You may apply an ice pack 20-30 minutes at a time to the injection site if you experience soreness.     Keep the injection site clean and dry. You make remove the band-aid one day following the procedure.     You may take a shower but AVOID getting in to baths, pools or whirlpools for 48 HOURS AFTER THE PROCEDURE.       ACTIVITY   Refrain from heavy activity for the next 24 to 48 hours. General walking is okay. You may resume your normal activities the day following the procedure.   You may start or resume your individualized exercise program or physical therapy 48 hours after the procedure.  IF YOU'VE HAD TRIGGER POINT INJECTIONS, YOU MAY CONTINUE EXERCISE OR PHYSICAL THERAPY WITHOUT DELAY.  MEDICATIONS   Please note that it is okay to continue other prescribed medications (blood pressure, insulin, water pill, depression/anxiety pill, etc.) as well as other prescribed pain medications such as Neurontin, Lyrical, Celebrex, Ultram, Vicodin, Norco and acetaminophen (Tylenol).   SIDE EFFECTS   Increase in pain during the first 24 to 48 hours.     You might experience:   1. Mild to moderate swelling at the joint.   2. Possible bruising at the injection site.     WHEN TO CALL THE DOCTOR/NURSE   Severe pain, worse or different that the pain you had before the procedure.     Fever or chills.     Redness, or swelling around the injection site.     Call the Pain Management  Procedural nurses [phone],  during normal business hours (7 am-3 pm). If it is AFTER HOURS or during a weekend or holiday, call the hospital operator and ask for the Anesthesia Pain physician on call at [phone].   FOR EMERGENCIES, CALL 911 OR GO TO THE NEAREST HOSPITAL EMERGENCY DEPARTMENT.   ?   TO SCHEDULE APPOINTMENTS OR FOR QUESTIONS RELATED TO MEDICATIONS   Call the Pain Management Clinic at [phone]

## 2023-02-18 NOTE — Unmapped (Signed)
02/18/23 1121   Pre-op Phone Call   Surgery Time Verified Yes   Expected Arrival Time 0845   Arrival Time Verified Yes   Surgery Location Verified Yes   Medical History Reviewed Yes     Pre-procedure call. Denies recent infection/antibiotics. Denies taking any recent blood thinners/NSAIDs.

## 2023-02-21 ENCOUNTER — Ambulatory Visit: Admit: 2023-02-21 | Discharge: 2023-02-22 | Payer: MEDICARE | Attending: Anesthesiology | Primary: Anesthesiology

## 2023-02-21 ENCOUNTER — Ambulatory Visit: Admit: 2023-02-21 | Discharge: 2023-02-22 | Payer: MEDICARE

## 2023-02-21 ENCOUNTER — Inpatient Hospital Stay: Admit: 2023-02-21 | Discharge: 2023-02-22 | Payer: MEDICARE

## 2023-02-21 DIAGNOSIS — M170 Bilateral primary osteoarthritis of knee: Principal | ICD-10-CM

## 2023-02-21 DIAGNOSIS — M1711 Unilateral primary osteoarthritis, right knee: Principal | ICD-10-CM

## 2023-02-21 DIAGNOSIS — M7918 Myalgia, other site: Principal | ICD-10-CM

## 2023-02-21 DIAGNOSIS — M1712 Unilateral primary osteoarthritis, left knee: Principal | ICD-10-CM

## 2023-02-21 MED ORDER — DICLOFENAC 20 MG/GRAM/ACTUATION (2 %) TOPICAL SOLN METERED-DOSE PUMP
Freq: Two times a day (BID) | TOPICAL | 0 refills | 0.00 days | Status: CP
Start: 2023-02-21 — End: ?

## 2023-02-21 MED ADMIN — bupivacaine HCl (MARCAINE) 0.5 % (5 mg/mL) injection 25 mg: 5 mL | @ 19:00:00 | Stop: 2023-02-21

## 2023-02-21 MED ADMIN — bupivacaine (PF) (MARCAINE) 0.25 % (2.5 mg/mL) injection (PF): @ 15:00:00 | Stop: 2023-02-21

## 2023-02-21 MED ADMIN — triamcinolone acetonide (KENALOG) injection 20 mg: 20 mg | INTRA_ARTICULAR | @ 19:00:00 | Stop: 2023-02-21

## 2023-02-21 NOTE — Unmapped (Signed)
PATIENT: Natalie [name]      AGE: 53 y.o.     DOB: November 23, 1970         Date of Examination:  02/21/2023      Primary Care Provider:  Royal Hawthorn, MD     Chief Complaint     Chief Complaint   Patient presents with    Left Knee - Follow-up    Right Knee - Follow-up        History Of Present Illness   Natalie [name] presents for a follow-up of left knee severe medial compartment osteoarthritis and right knee osteoarthritis. She is a patient of Dr. Leone Payor and has been treated with steroid injections and viscosupplementation.  Her last steroid injection to the left knee was in November and she noted improvement with this. She underwent Gelsy injection series last month and feels the injections did not help her left knee significantly but feels her right knee is about 98% improved. She feels the one-injection series was more beneficial for the left knee.  She also had bilateral knee viscosupplementation in March with months of relief.  She would like to consider repeat steroid injection for the left knee today.  She is taking Tylenol for pain.     Home Medications     Current Outpatient Medications:     albuterol HFA 90 mcg/actuation inhaler, Inhale 2 puffs every six (6) hours as needed for wheezing., Disp: 54 g, Rfl: 0    albuterol HFA 90 mcg/actuation inhaler, Inhale 2 puffs every six (6) hours as needed for wheezing or shortness of breath., Disp: 54 g, Rfl: 0    azelastine (ASTELIN) 137 mcg (0.1 %) nasal spray, 2 sprays into each nostril two (2) times a day., Disp: 30 mL, Rfl: 5    azelastine (OPTIVAR) 0.05 % ophthalmic solution, Administer 2 drops to both eyes daily as needed., Disp: 6 mL, Rfl: 5    baclofen (LIORESAL) 20 MG tablet, 10 - 20 mg three times a day as needed for spasms., Disp: 90 tablet, Rfl: 2    cetirizine (ZYRTEC) 10 MG chewable tablet, Chew 1 tablet (10 mg total) at bedtime as needed for allergies., Disp: , Rfl:     diclofenac sodium 20 mg/gram /actuation(2 %) sopm, APPLY 40 MG TOPICALLY TO THE AFFECTED AREA TWICE DAILY, Disp: 112 g, Rfl: 0    empty container Misc, Use as directed to dispose of Xolair syringes, Disp: 1 each, Rfl: 2    EPINEPHrine (EPIPEN) 0.3 mg/0.3 mL injection, Inject 0.3 mL (0.3 mg total) into the muscle once as needed for anaphylaxis (Difficulty breathing, throat closing, etc) for up to 1 dose., Disp: 1 each, Rfl: 12    escitalopram oxalate (LEXAPRO) 10 MG tablet, 1/2 tab PO Daily for 6 days. If well tolerated, increase to 1 whole tablet PO daily., Disp: 30 tablet, Rfl: 1    estradiol (ESTRACE) 1 MG tablet, Take 1 tablet (1 mg total) by mouth daily., Disp: 90 tablet, Rfl: 3    famotidine (PEPCID) 20 MG tablet, Take 1 tablet (20 mg total) by mouth two (2) times a day., Disp: , Rfl:     gabapentin (NEURONTIN) 800 MG tablet, Take 1 tablet (800 mg total) by mouth two (2) times a day., Disp: 180 tablet, Rfl: 0    hydroCHLOROthiazide 12.5 MG tablet, TAKE 1 TABLET(12.5 MG) BY MOUTH DAILY AS NEEDED FOR SWELLING, Disp: 90 tablet, Rfl: 3    hydroxychloroquine (PLAQUENIL) 200 mg tablet, Take 1 tablet (200 mg total)  by mouth two (2) times a day., Disp: 60 tablet, Rfl: 12    ibuprofen (MOTRIN) 800 MG tablet, Take 1 tablet (800 mg total) by mouth two (2) times a day as needed for pain., Disp: 60 tablet, Rfl: 0    methylPREDNISolone (MEDROL DOSEPACK) 4 mg tablet, follow package directions, Disp: 1 each, Rfl: 0    omalizumab (XOLAIR) 150 mg/mL syringe, Inject 2 mL (300 mg total) under the skin every fourteen (14) days., Disp: 4 mL, Rfl: 11    omalizumab (XOLAIR) 75 mg/0.5 mL syringe, Inject under the skin., Disp: , Rfl:     ondansetron (ZOFRAN) 4 MG tablet, Take 1 tablet (4 mg total) by mouth every eight (8) hours as needed for nausea (during a migraine)., Disp: 30 tablet, Rfl: 2    oxyCODONE-acetaminophen (PERCOCET) 10-325 mg per tablet, Take 1 tablet by mouth every four (4) hours as needed for pain. Max 4 tabs/day. Fill on or after: 11/03/22. Brand name only due to allergy., Disp: 120 tablet, Rfl: 0    oxyCODONE-acetaminophen (PERCOCET) 10-325 mg per tablet, Take 1 tablet by mouth every four (4) hours as needed for pain. Max 4 tabs/day. Fill on or after: 01/02/23. Brand name only due to allergy., Disp: 120 tablet, Rfl: 0    oxyCODONE-acetaminophen (PERCOCET) 10-325 mg per tablet, Take 1 tablet by mouth every four (4) hours as needed for pain. Max 4 tabs/day. Fill on or after: 02/01/23. Brand name only due to allergy., Disp: 120 tablet, Rfl: 0    [START ON 03/03/2023] oxyCODONE-acetaminophen (PERCOCET) 10-325 mg per tablet, Take 1 tablet by mouth every four (4) hours as needed for pain. Max 4 tabs/day. Fill on or after: 03/03/23. Brand name only due to allergy., Disp: 120 tablet, Rfl: 0    oxyCODONE-acetaminophen (PERCOCET) 10-325 mg per tablet, Take 1 tablet by mouth every four (4) hours as needed for pain. Max 4 tabs/day. Fill on or after 12/27/22. Partial refill #3 tabs only, pt had # 117 refilled already. Brand name only due to allergy., Disp: 3 tablet, Rfl: 0    pantoprazole (PROTONIX) 40 MG tablet, TAKE 1 TABLET(40 MG) BY MOUTH DAILY 30 MINUTES BEFORE BREAKFAST AS NEEDED, Disp: 90 tablet, Rfl: 2    potassium chloride 20 MEQ ER tablet, Take 1 tablet (20 mEq total) by mouth daily., Disp: 90 tablet, Rfl: 3    PROCHAMBER Spcr, , Disp: , Rfl:     progesterone (PROMETRIUM) 100 MG capsule, Take 1 capsule (100 mg total) by mouth nightly., Disp: 90 capsule, Rfl: 3    rosuvastatin (CRESTOR) 10 MG tablet, Take 1 tablet (10 mg total) by mouth nightly., Disp: 90 tablet, Rfl: 3    triamcinolone (KENALOG) 0.1 % cream, Apply topically two (2) times a day as needed WHEN ALLERGIES FLARE-UP AND CAUSES RASH / ITCHING, Disp: 80 g, Rfl: 0    UBRELVY 50 mg tablet, TAKE 1 TABLET BY MOUTH FOR HEADACHE. MAY REPEAT IN 2 HOURS IF NEEDED. LIMIT 2 TABLETS PER DAY AND NO MORE THAN 4 TABLETS PER WEEK, Disp: 16 tablet, Rfl: 3    venlafaxine (EFFEXOR XR) 37.5 MG 24 hr capsule, Take 1 capsule (37.5 mg total) by mouth daily for 30 days, THEN 2 capsules (75 mg total) daily., Disp: 60 capsule, Rfl: 5    XHANCE 93 mcg/actuation AerB, 1 spray into each nostril two (2) times a day as needed., Disp: 16 mL, Rfl: 11    albuterol HFA 90 mcg/actuation inhaler, Inhale 2 puffs every four (4) hours as  needed for wheezing., Disp: 54 g, Rfl: 0    diclofenac sodium 20 mg/gram /actuation(2 %) sopm, Apply 40 mg topically two (2) times a day., Disp: 112 g, Rfl: 0    Current Facility-Administered Medications:     bupivacaine HCl (MARCAINE) 0.5 % (5 mg/mL) injection 25 mg, 5 mL, Other, Once, Diona Browner, Marion Downer, PA    omalizumab Geoffry Paradise) injection 300 mg, 300 mg, Subcutaneous, Q28 Days, Volertas, Sofija Dalia, MD, 300 mg at 03/09/22 1441    triamcinolone acetonide (KENALOG) injection 20 mg, 20 mg, Intra-articular, Once, Audley Hose, PA     Allergies   Buprenorphine hcl, Pregabalin, Buprenorphine, Cephalexin monohydrate, Doxycycline, Oxycodone hcl, Adhesive tape-silicones, Doxycycline hyclate (bulk), Keflex [cephalexin], Opioids - morphine analogues, and Oxycodone-acetaminophen    Medical History     Past Medical History:   Diagnosis Date    Allergic conjunctivitis 11/23/2018    Allergic rhinitis     Anemia 1986    Arthritis     Breast mass     Chondromalacia of left knee     Chondromalacia of right knee     Cluster headache     Depressive disorder     Dizziness     Fibrocystic breast     GERD (gastroesophageal reflux disease)     Headache, tension-type     Hematuria     History of kidney stones     History of transfusion     Spinal Fusion    Hyperlipidemia 1999    Hypothyroidism     Kidney stone     Lumbar disc disease 1998    She was injured at work at the age of 44 and then had disk surgery 2 years later     Menopause ovarian failure     Menopause, premature     Migraine with aura     Neuromuscular disorder (CMS-HCC) 1999    Obesity (BMI 30-39.9) 11/23/2018    Ovarian cyst     Plantar fasciitis     Pseudotumor cerebri     Rash due to allergy 11/23/2018    Scoliosis     Sickle cell trait (CMS-HCC)     Sleep apnea     Urinary tract infection     Vaginitis        Surgical History     Past Surgical History:   Procedure Laterality Date    BACK SURGERY  2001    BREAST BIOPSY Bilateral     When patient was 16 & 17 Both Negative    EPIDURAL BLOCK INJECTION      HYSTERECTOMY  2008    PR REMOVAL OF TONSILS,13+ Y/O Bilateral 10/10/2019    Procedure: TONSILLECTOMY;  Surgeon: Lona Millard, MD;  Location: OR [facility];  Service: ENT    SALPINGOOPHORECTOMY Bilateral 2007    SPINAL FUSION      SPINE SURGERY  2001    TOTAL VAGINAL HYSTERECTOMY  01/04/2006    Uterine Prolapse-Dr. Leeanne Rio OP Note    TRIGGER POINT INJECTION      TUBAL LIGATION  1995       Social History     Social History     Tobacco Use    Smoking status: Never     Passive exposure: Past    Smokeless tobacco: Never   Vaping Use    Vaping status: Never Used   Substance Use Topics    Alcohol use: Yes     Comment: Only drink for really special occasions  Drug use: Never       Family History     Family History   Problem Relation Age of Onset    Hypertension Mother     Heart disease Mother     Arthritis Mother     Depression Mother     Drug abuse Mother     Mental illness Mother     Ulcers Mother     COPD Mother     Allergic rhinitis Mother     Heart attack Father     Mental illness Father     Asthma Father     Cancer Sister         Lymphoma    Heart attack Maternal Grandmother     Heart disease Maternal Grandmother     Hypertension Maternal Grandmother     Thyroid disease Maternal Grandmother     Stroke Paternal Grandfather     Depression Son     Drug abuse Son     Mental illness Son     COPD Maternal Aunt     Heart attack Cousin 49    Breast cancer Cousin 32    Thyroid disease Other     Cancer Sister     COPD Maternal Aunt     Migraines Maternal Aunt     Depression Son     Drug abuse Son     Mental illness Son     Alcohol abuse Neg Hx        Physical Exam          02/21/23 1336   BP: 130/83   Pulse: 74   Weight: 77 kg (169 lb 12.1 oz)   Height: 154.9 cm (5' 1)      Pain Assessment  Pain Assessment: 0-10  0-10 Pain Scale: 6  Pain Location: Knee  Pain Orientation: Left  Pain Descriptors: Aching, Burning    Appearance:  Well nourished/well developed.  Psychiatric:  Mood and affect appropriate.    Head:   Normocephalic, atraumatic.   Skin:   Clean, dry, no lesions, no rashes.  Neuro:   Alert and oriented.  Respiratory:  Unlabored.       FOCUSED EXAM:  Bilateral knees: Trace effusion. Tenderness along the medial joint lines greater on the left and tenderness on the left lateral joint line. Minimal tenderness to the patellofemoral joints. Full extension with flexion to 125 degrees. Patellar crepitation with motion. Normal gait.       Imaging   No image results found.      Assessment & Plan       ICD-10-CM   1. Primary osteoarthritis of both knees  M17.0   2. Primary osteoarthritis of left knee  M17.12   3. Primary osteoarthritis of right knee  M17.11         Plan: Left knee severe medial compartment osteoarthritis, right knee osteoarthritis; Gelsyn injection series 12/2022 with relief on the right knee but recurrent symptoms on the left knee     We discussed the diagnosis and further treatment for her knee.  She will begin icing the knees 20 minutes daily.  She may take tylenol 500-1000mg  twice daily as needed.  She has done well in the past with steroid injections and would like to repeat this in the left knee today.  We reviewed risks and benefits and she desired to proceed.     Procedure: After sterile prep, 5 cc 0.5% Marcaine and 2 cc Kenalog was injected into the left knee.  The patient tolerated the procedure well.     We also discussed repeat viscosupplementation and may consider the one-injection as she had more improvement with this than the 3-injection series - we would try for a repeat series in June.  Follow-up in 8 weeks with Dr. Zenda Alpers.    Medications Prescribed Today diclofenac sodium 20 mg/gram /actuation(2 %) sopm Apply 40 mg topically two (2) times a day.                  Carolynn Comment Adah Stoneberg, PA  Date: 02/21/2023  Time: 2:09 PM    This note was created utilizing Dragon Medical dictation software and may contain transcription errors missed during proofreading.
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
## 2023-02-22 NOTE — Unmapped (Signed)
[facility] Refill Coordination Note    Specialty Medication(s) to be Shipped:   CF/Pulmonary/Asthma: Xolair 150mg /ml    Other medication(s) to be shipped: No additional medications requested for fill at this time     Natalie [name], DOB: Nov 22, 1970  Phone: [phone] (home)       All above HIPAA information was verified with patient.     Was a Nurse, learning disability used for this call? No    Completed refill call assessment today to schedule patient's medication shipment from the [facility] [phone]).  All relevant notes have been reviewed.     Specialty medication(s) and dose(s) confirmed: Regimen is correct and unchanged.   Changes to medications: Natalie [name] reports no changes at this time.  Changes to insurance: No  New side effects reported not previously addressed with a pharmacist or physician: None reported  Questions for the pharmacist: No    Confirmed patient received a Conservation officer, historic buildings and a Surveyor, mining with first shipment. The patient will receive a drug information handout for each medication shipped and additional FDA Medication Guides as required.       DISEASE/MEDICATION-SPECIFIC INFORMATION        For patients on injectable medications: Patient currently has 0 doses left.  Next injection is scheduled for 02/25/23.    SPECIALTY MEDICATION ADHERENCE     Medication Adherence    Patient reported X missed doses in the last month: 0  Specialty Medication: XOLAIR 150 mg/mL syringe (omalizumab)  Patient is on additional specialty medications: No              Were doses missed due to medication being on hold? No    XOLAIR 150 mg/mL syringe (omalizumab)  : 0 days of medicine on hand       REFERRAL TO PHARMACIST     Referral to the pharmacist: Not needed      [facility]     Shipping address confirmed in Epic.       Delivery Scheduled: Yes, Expected medication delivery date: 02/24/23.     Medication will be delivered via Same Day Courier to the prescription address in Epic WAM.    Dan Europe   [facility] Specialty Technician

## 2023-02-22 NOTE — Unmapped (Unsigned)
Name:  Natalie [name]  DOB: 1970-06-21  Today's Date: 02/22/2023  Age:  53 y.o.    Assessment/Plan:      There are no diagnoses linked to this encounter.    Diagnosis and plan along with any newly prescribed medication(s) were discussed in detail with this patient today. The patient verbalized understanding and agreed with the plan without language barriers or behavioral barriers to understanding unless otherwise noted.    Subjective:     HPI: Natalie [name] is a 53 y.o. female is here for    No chief complaint on file.    She has been having    Past Medical/Surgical History:     Past Medical History:   Diagnosis Date    Allergic conjunctivitis 11/23/2018    Allergic rhinitis     Anemia 1986    Arthritis     Breast mass     Chondromalacia of left knee     Chondromalacia of right knee     Cluster headache     Depressive disorder     Dizziness     Fibrocystic breast     GERD (gastroesophageal reflux disease)     Headache, tension-type     Hematuria     History of kidney stones     History of transfusion     Spinal Fusion    Hyperlipidemia 1999    Hypothyroidism     Kidney stone     Lumbar disc disease 1998    She was injured at work at the age of 89 and then had disk surgery 2 years later     Menopause ovarian failure     Menopause, premature     Migraine with aura     Neuromuscular disorder (CMS-HCC) 1999    Obesity (BMI 30-39.9) 11/23/2018    Ovarian cyst     Plantar fasciitis     Pseudotumor cerebri     Rash due to allergy 11/23/2018    Scoliosis     Sickle cell trait (CMS-HCC)     Sleep apnea     Urinary tract infection     Vaginitis      Past Surgical History:   Procedure Laterality Date    BACK SURGERY  2001    BREAST BIOPSY Bilateral     When patient was 16 & 17 Both Negative    EPIDURAL BLOCK INJECTION      HYSTERECTOMY  2008    PR REMOVAL OF TONSILS,13+ Y/O Bilateral 10/10/2019    Procedure: TONSILLECTOMY;  Surgeon: Lona Millard, MD;  Location: [facility];  Service: ENT    SALPINGOOPHORECTOMY Bilateral 2007    SPINAL FUSION      SPINE SURGERY  2001    TOTAL VAGINAL HYSTERECTOMY  01/04/2006    Uterine Prolapse-Dr. Leeanne Rio OP Note    TRIGGER POINT INJECTION      TUBAL LIGATION  1995       Family History:     Family History   Problem Relation Age of Onset    Hypertension Mother     Heart disease Mother     Arthritis Mother     Depression Mother     Drug abuse Mother     Mental illness Mother     Ulcers Mother     COPD Mother     Allergic rhinitis Mother     Heart attack Father     Mental illness Father     Asthma Father  Cancer Sister         Lymphoma    Heart attack Maternal Grandmother     Heart disease Maternal Grandmother     Hypertension Maternal Grandmother     Thyroid disease Maternal Grandmother     Stroke Paternal Grandfather     Depression Son     Drug abuse Son     Mental illness Son     COPD Maternal Aunt     Heart attack Cousin 17    Breast cancer Cousin 32    Thyroid disease Other     Cancer Sister     COPD Maternal Aunt     Migraines Maternal Aunt     Depression Son     Drug abuse Son     Mental illness Son     Alcohol abuse Neg Hx        Social History:     Social History     Socioeconomic History    Marital status: Single    Number of children: 2    Years of education: 15   Occupational History     Employer: NOT EMPLOYED   Tobacco Use    Smoking status: Never     Passive exposure: Past    Smokeless tobacco: Never   Vaping Use    Vaping status: Never Used   Substance and Sexual Activity    Alcohol use: Yes     Comment: Only drink for really special occasions    Drug use: Never    Sexual activity: Yes     Partners: Male     Birth control/protection: Post-menopausal, Surgical     Comment: steady partner for 4 yrs, as on 04/03/19.   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, agricultural)     Country of Origin - [place]                      Social Drivers of Psychologist, prison and probation services Strain: Low Risk  (10/07/2022)    Overall Financial Resource Strain (CARDIA)     Difficulty of Paying Living Expenses: Not hard at all   Food Insecurity: No Food Insecurity (10/07/2022)    Hunger Vital Sign     Worried About Running Out of Food in the Last Year: Never true     Ran Out of Food in the Last Year: Never true   Transportation Needs: No Transportation Needs (10/07/2022)    PRAPARE - Transportation     Lack of Transportation (Medical): No     Lack of Transportation (Non-Medical): No   Stress: Stress Concern Present (06/16/2022)    [employer] of Occupational Health - Occupational Stress Questionnaire     Feeling of Stress : To some extent       Allergies:     Buprenorphine hcl, Pregabalin, Buprenorphine, Cephalexin monohydrate, Doxycycline, Oxycodone hcl, Adhesive tape-silicones, Doxycycline hyclate (bulk), Keflex [cephalexin], Opioids - morphine analogues, and Oxycodone-acetaminophen    Current Medications:     Current Outpatient Medications  Medication Sig Dispense Refill    albuterol HFA 90 mcg/actuation inhaler Inhale 2 puffs every four (4) hours as needed for wheezing. 54 g 0    albuterol HFA 90 mcg/actuation inhaler Inhale 2 puffs every six (6) hours as needed for wheezing. 54 g 0    albuterol HFA 90 mcg/actuation inhaler Inhale 2 puffs every six (6) hours as needed for wheezing or shortness of breath. 54 g 0    azelastine (ASTELIN) 137 mcg (0.1 %) nasal spray 2 sprays into each nostril two (2) times a day. 30 mL 5    azelastine (OPTIVAR) 0.05 % ophthalmic solution Administer 2 drops to both eyes daily as needed. 6 mL 5    baclofen (LIORESAL) 20 MG tablet 10 - 20 mg three times a day as needed for spasms. 90 tablet 2    cetirizine (ZYRTEC) 10 MG chewable tablet Chew 1 tablet (10 mg total) at bedtime as needed for allergies.      diclofenac sodium 20 mg/gram /actuation(2 %) sopm APPLY 40 MG TOPICALLY TO THE AFFECTED AREA TWICE DAILY 112 g 0    diclofenac sodium 20 mg/gram /actuation(2 %) sopm Apply 40 mg topically two (2) times a day. 112 g 0    empty container Misc Use as directed to dispose of Xolair syringes 1 each 2    EPINEPHrine (EPIPEN) 0.3 mg/0.3 mL injection Inject 0.3 mL (0.3 mg total) into the muscle once as needed for anaphylaxis (Difficulty breathing, throat closing, etc) for up to 1 dose. 1 each 12    escitalopram oxalate (LEXAPRO) 10 MG tablet 1/2 tab PO Daily for 6 days. If well tolerated, increase to 1 whole tablet PO daily. 30 tablet 1    estradiol (ESTRACE) 1 MG tablet Take 1 tablet (1 mg total) by mouth daily. 90 tablet 3    famotidine (PEPCID) 20 MG tablet Take 1 tablet (20 mg total) by mouth two (2) times a day.      gabapentin (NEURONTIN) 800 MG tablet Take 1 tablet (800 mg total) by mouth two (2) times a day. 180 tablet 0    hydroCHLOROthiazide 12.5 MG tablet TAKE 1 TABLET(12.5 MG) BY MOUTH DAILY AS NEEDED FOR SWELLING 90 tablet 3    hydroxychloroquine (PLAQUENIL) 200 mg tablet Take 1 tablet (200 mg total) by mouth two (2) times a day. 60 tablet 12    ibuprofen (MOTRIN) 800 MG tablet Take 1 tablet (800 mg total) by mouth two (2) times a day as needed for pain. 60 tablet 0    methylPREDNISolone (MEDROL DOSEPACK) 4 mg tablet follow package directions 1 each 0    omalizumab (XOLAIR) 150 mg/mL syringe Inject 2 mL (300 mg total) under the skin every fourteen (14) days. 4 mL 11    omalizumab (XOLAIR) 75 mg/0.5 mL syringe Inject under the skin.      ondansetron (ZOFRAN) 4 MG tablet Take 1 tablet (4 mg total) by mouth every eight (8) hours as needed for nausea (during a migraine). 30 tablet 2    oxyCODONE-acetaminophen (PERCOCET) 10-325 mg per tablet Take 1 tablet by mouth every four (4) hours as needed for pain. Max 4 tabs/day. Fill on or after: 11/03/22. Brand name only due to allergy. 120 tablet 0    oxyCODONE-acetaminophen (PERCOCET) 10-325 mg per tablet Take 1 tablet by mouth every four (4) hours as needed for pain. Max 4 tabs/day. Fill on or after: 01/02/23. Brand name only due to allergy. 120 tablet 0    oxyCODONE-acetaminophen (PERCOCET) 10-325  mg per tablet Take 1 tablet by mouth every four (4) hours as needed for pain. Max 4 tabs/day. Fill on or after: 02/01/23. Brand name only due to allergy. 120 tablet 0    [START ON 03/03/2023] oxyCODONE-acetaminophen (PERCOCET) 10-325 mg per tablet Take 1 tablet by mouth every four (4) hours as needed for pain. Max 4 tabs/day. Fill on or after: 03/03/23. Brand name only due to allergy. 120 tablet 0    oxyCODONE-acetaminophen (PERCOCET) 10-325 mg per tablet Take 1 tablet by mouth every four (4) hours as needed for pain. Max 4 tabs/day. Fill on or after 12/27/22. Partial refill #3 tabs only, pt had # 117 refilled already. Brand name only due to allergy. 3 tablet 0    pantoprazole (PROTONIX) 40 MG tablet TAKE 1 TABLET(40 MG) BY MOUTH DAILY 30 MINUTES BEFORE BREAKFAST AS NEEDED 90 tablet 2    potassium chloride 20 MEQ ER tablet Take 1 tablet (20 mEq total) by mouth daily. 90 tablet 3    PROCHAMBER Spcr       progesterone (PROMETRIUM) 100 MG capsule Take 1 capsule (100 mg total) by mouth nightly. 90 capsule 3    rosuvastatin (CRESTOR) 10 MG tablet Take 1 tablet (10 mg total) by mouth nightly. 90 tablet 3    triamcinolone (KENALOG) 0.1 % cream Apply topically two (2) times a day as needed WHEN ALLERGIES FLARE-UP AND CAUSES RASH / ITCHING 80 g 0    UBRELVY 50 mg tablet TAKE 1 TABLET BY MOUTH FOR HEADACHE. MAY REPEAT IN 2 HOURS IF NEEDED. LIMIT 2 TABLETS PER DAY AND NO MORE THAN 4 TABLETS PER WEEK 16 tablet 3    venlafaxine (EFFEXOR XR) 37.5 MG 24 hr capsule Take 1 capsule (37.5 mg total) by mouth daily for 30 days, THEN 2 capsules (75 mg total) daily. 60 capsule 5    XHANCE 93 mcg/actuation AerB 1 spray into each nostril two (2) times a day as needed. 16 mL 11 Current Facility-Administered Medications   Medication Dose Route Frequency Provider Last Rate Last Admin    omalizumab (XOLAIR) injection 300 mg  300 mg Subcutaneous Q28 Days Volertas, Sofija Dalia, MD   300 mg at 03/09/22 1441       ROS:     All ROS negative unless otherwise noted in HPI.    Vital Signs:     Wt Readings from Last 3 Encounters:   02/21/23 77 kg (169 lb 12.1 oz)   02/16/23 77.4 kg (170 lb 9.6 oz)   01/28/23 73.5 kg (162 lb)     Temp Readings from Last 3 Encounters:   02/21/23 36.1 ??C (96.9 ??F) (Temporal)   12/18/22 36.7 ??C (98.1 ??F) (Tympanic)   12/15/22 36.6 ??C (97.8 ??F) (Temporal)     BP Readings from Last 3 Encounters:   02/21/23 137/89   02/21/23 130/83   02/16/23 139/81     Pulse Readings from Last 3 Encounters:   02/21/23 64   02/21/23 74   02/16/23 78     There is no height or weight on file to calculate BMI.    Objective:      Physical Exam  Vitals and nursing note reviewed.   Constitutional:       General: She is not in acute distress.     Appearance: Normal appearance. She is not ill-appearing or diaphoretic.   Eyes:      General: No scleral icterus.     Conjunctiva/sclera: Conjunctivae normal.   Neck:  Thyroid: No thyroid mass, thyromegaly or thyroid tenderness.      Vascular: No JVD.      Comments: No thyromegaly, no nodules or masses felt on exam.  Cardiovascular:      Rate and Rhythm: Normal rate and regular rhythm.      Pulses: Normal pulses.      Heart sounds: Normal heart sounds. No murmur heard.  Pulmonary:      Effort: Pulmonary effort is normal. No respiratory distress.      Breath sounds: Normal breath sounds. No wheezing.   Musculoskeletal:      Right lower leg: No edema.      Left lower leg: No edema.   Neurological:      Mental Status: She is alert and oriented to person, place, and time.   Psychiatric:         Mood and Affect: Mood normal.         Behavior: Behavior normal.         Thought Content: Thought content normal.         Judgment: Judgment normal. Labs:     No results found for this visit on 02/23/23.    I attest that I, Rayetta Humphrey, personally documented this note while acting as scribe for Royal Hawthorn, MD.      Rayetta Humphrey, Scribe.  02/23/2023     The documentation recorded by the scribe accurately reflects the service I personally performed and the decisions made by me.    Royal Hawthorn, MD

## 2023-02-23 ENCOUNTER — Ambulatory Visit: Admit: 2023-02-23 | Discharge: 2023-02-23 | Payer: MEDICARE

## 2023-02-23 DIAGNOSIS — R3915 Urgency of urination: Principal | ICD-10-CM

## 2023-02-23 DIAGNOSIS — J3089 Other allergic rhinitis: Principal | ICD-10-CM

## 2023-02-23 DIAGNOSIS — H66002 Acute suppurative otitis media without spontaneous rupture of ear drum, left ear: Principal | ICD-10-CM

## 2023-02-23 DIAGNOSIS — R350 Frequency of micturition: Principal | ICD-10-CM

## 2023-02-23 DIAGNOSIS — J302 Other seasonal allergic rhinitis: Principal | ICD-10-CM

## 2023-02-23 DIAGNOSIS — F1120 Opioid dependence, uncomplicated: Principal | ICD-10-CM

## 2023-02-23 MED ORDER — IPRATROPIUM BROMIDE 21 MCG (0.03 %) NASAL SPRAY
Freq: Four times a day (QID) | NASAL | 1 refills | 43.00 days | Status: CP
Start: 2023-02-23 — End: 2024-02-23

## 2023-02-23 MED ORDER — AMOXICILLIN 500 MG CAPSULE
ORAL_CAPSULE | Freq: Two times a day (BID) | ORAL | 0 refills | 10.00 days | Status: CP
Start: 2023-02-23 — End: 2023-03-05

## 2023-02-23 NOTE — Unmapped (Signed)
Name:  Natalie [name]  DOB: 1970/08/07  Today's Date: 02/23/2023  Age:  53 y.o.    Assessment/Plan:      Natalie [name] was seen today for otalgia.    Diagnoses and all orders for this visit:    Non-recurrent acute suppurative otitis media of left ear without spontaneous rupture of tympanic membrane  -     amoxicillin (AMOXIL) 500 MG capsule; Take 2 capsules (1,000 mg total) by mouth two (2) times a day for 10 days.    Uncomplicated opioid dependence (CMS-HCC)    Urinary frequency  -     Urinalysis with Microscopy with Culture Reflex; Future    Urgency of urination  -     Urinalysis with Microscopy with Culture Reflex; Future    Perennial allergic rhinitis with seasonal variation  -     ipratropium (ATROVENT) 21 mcg (0.03 %) nasal spray; 2 sprays into each nostril four (4) times a day.      UA to rule out urinary infection. Will await culture sensitivity to determine appropriate antibiotic treatment.    Trial of amoxicillin for symptoms resolution, as apparently, she has tolerated it well in the past.  Recommended use of Atrovent, along with current nasal sprays for effusion and sinus pressure relief.    We shall consider ENT referral for further evaluation of recurrent sinusitis and persistent ear pain, if symptoms persist.    Eye exam recommended, if blurred vision persists    Shingrix, and updated COVID-19 vaccines recommended.    Diagnosis and plan along with any newly prescribed medication(s) were discussed in detail with this patient today. The patient verbalized understanding and agreed with the plan without language barriers or behavioral barriers to understanding unless otherwise noted.    Subjective:     HPI: Natalie [name] is a 53 y.o. female is here for    Chief Complaint   Patient presents with    Otalgia     Left ear pain for 1 month affecting left eye sight as well         She has been having of persistent on and off left sided otalgia x 1 month, and worsening recently. She feels it has been affecting her vision in left eye.  Describes the pain as sharp and achy pain, and has had symptoms indicative of vertigo.  Also complains of left sided sore throat and left sided jaw soreness.  Feels the left ear is muffled and full.  Admits, some nasal stuffiness, and sinus pressure.  Denies tinnitus, fever, chills, cough, post nasal drip.  Been using fluticasone and azelastine nose sprays, along with oral cetirizine BID.    She has had frequent otalgia and was seen by ENT in the past.  Follows with allergist for chronic urticaria. She does take Xolair twice monthly.  She was not found to have major allergies on skin allergy testing in the past.     She has urinary frequency, with urgency since yesterday.  Has been passing urine in small amounts despite urge.  Admits some dysuria.  No abdominal pain, nausea or vomiting, constipation.  Claims to drink adequate amount of water daily.  Passes stools x 2-3 daily    Reports, has been tapering off of hormone therapy as she noticed it was causing BP to go higher.    Past Medical/Surgical History:     Past Medical History:   Diagnosis Date    Allergic conjunctivitis 11/23/2018    Allergic rhinitis  Anemia 1986    Arthritis     Breast mass     Chondromalacia of left knee     Chondromalacia of right knee     Cluster headache     Depressive disorder     Dizziness     Fibrocystic breast     GERD (gastroesophageal reflux disease)     Headache, tension-type     Hematuria     History of kidney stones     History of transfusion     Spinal Fusion    Hyperlipidemia 1999    Hypothyroidism     Kidney stone     Lumbar disc disease 1998    She was injured at work at the age of 58 and then had disk surgery 2 years later     Menopause ovarian failure     Menopause, premature     Migraine with aura     Neuromuscular disorder (CMS-HCC) 1999    Obesity (BMI 30-39.9) 11/23/2018    Ovarian cyst     Plantar fasciitis     Pseudotumor cerebri     Rash due to allergy 11/23/2018 Scoliosis     Sickle cell trait (CMS-HCC)     Sleep apnea     Urinary tract infection     Vaginitis      Past Surgical History:   Procedure Laterality Date    BACK SURGERY  2001    BREAST BIOPSY Bilateral     When patient was 16 & 17 Both Negative    EPIDURAL BLOCK INJECTION      HYSTERECTOMY  2008    PR REMOVAL OF TONSILS,13+ Y/O Bilateral 10/10/2019    Procedure: TONSILLECTOMY;  Surgeon: Lona Millard, MD;  Location: OR [facility];  Service: ENT    SALPINGOOPHORECTOMY Bilateral 2007    SPINAL FUSION      SPINE SURGERY  2001    TOTAL VAGINAL HYSTERECTOMY  01/04/2006    Uterine Prolapse-Dr. Leeanne Rio OP Note    TRIGGER POINT INJECTION      TUBAL LIGATION  1995       Family History:     Family History   Problem Relation Age of Onset    Hypertension Mother     Heart disease Mother     Arthritis Mother     Depression Mother     Drug abuse Mother     Mental illness Mother     Ulcers Mother     COPD Mother     Allergic rhinitis Mother     Heart attack Father     Mental illness Father     Asthma Father     Cancer Sister         Lymphoma    Heart attack Maternal Grandmother     Heart disease Maternal Grandmother     Hypertension Maternal Grandmother     Thyroid disease Maternal Grandmother     Stroke Paternal Grandfather     Depression Son     Drug abuse Son     Mental illness Son     COPD Maternal Aunt     Heart attack Cousin 61    Breast cancer Cousin 32    Thyroid disease Other     Cancer Sister     COPD Maternal Aunt     Migraines Maternal Aunt     Depression Son     Drug abuse Son     Mental illness Son     Alcohol abuse Neg  Hx        Social History:     Social History     Socioeconomic History    Marital status: Single     Spouse name: None    Number of children: 2    Years of education: 15    Highest education level: None   Occupational History     Employer: NOT EMPLOYED   Tobacco Use    Smoking status: Never     Passive exposure: Past    Smokeless tobacco: Never   Vaping Use    Vaping status: Never Used Substance and Sexual Activity    Alcohol use: Yes     Comment: Only drink for really special occasions    Drug use: Never    Sexual activity: Yes     Partners: Male     Birth control/protection: Post-menopausal, Surgical     Comment: steady partner for 4 yrs, as on 04/03/19.   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, agricultural)     Country of Origin - [place]                      Social Drivers of Psychologist, prison and probation services Strain: Low Risk  (10/07/2022)    Overall Financial Resource Strain (CARDIA)     Difficulty of Paying Living Expenses: Not hard at all   Food Insecurity: No Food Insecurity (10/07/2022)    Hunger Vital Sign     Worried About Running Out of Food in the Last Year: Never true     Ran Out of Food in the Last Year: Never true   Transportation Needs: No Transportation Needs (10/07/2022)    PRAPARE - Transportation     Lack of Transportation (Medical): No     Lack of Transportation (Non-Medical): No   Stress: Stress Concern Present (06/16/2022)    [employer] of Occupational Health - Occupational Stress Questionnaire     Feeling of Stress : To some extent       Allergies:     Buprenorphine hcl, Pregabalin, Buprenorphine, Cephalexin monohydrate, Doxycycline, Oxycodone hcl, Adhesive tape-silicones, Doxycycline hyclate (bulk), Keflex [cephalexin], Opioids - morphine analogues, and Oxycodone-acetaminophen    Current Medications:     Current Outpatient Medications   Medication Sig Dispense Refill    albuterol HFA 90 mcg/actuation inhaler Inhale 2 puffs every four (4) hours as needed for wheezing. 54 g 0    albuterol HFA 90 mcg/actuation inhaler Inhale 2 puffs every six (6) hours as needed for wheezing. 54 g 0    albuterol HFA 90 mcg/actuation inhaler Inhale 2 puffs every six (6) hours as needed for wheezing or shortness of breath. 54 g 0    azelastine (ASTELIN) 137 mcg (0.1 %) nasal spray 2 sprays into each nostril two (2) times a day. 30 mL 5    azelastine (OPTIVAR) 0.05 % ophthalmic solution Administer 2 drops to both eyes daily as needed. 6 mL 5    baclofen (LIORESAL) 20 MG tablet 10 - 20 mg  three times a day as needed for spasms. 90 tablet 2    cetirizine (ZYRTEC) 10 MG chewable tablet Chew 1 tablet (10 mg total) at bedtime as needed for allergies.      diclofenac sodium 20 mg/gram /actuation(2 %) sopm APPLY 40 MG TOPICALLY TO THE AFFECTED AREA TWICE DAILY 112 g 0    diclofenac sodium 20 mg/gram /actuation(2 %) sopm Apply 40 mg topically two (2) times a day. 112 g 0    empty container Misc Use as directed to dispose of Xolair syringes 1 each 2    EPINEPHrine (EPIPEN) 0.3 mg/0.3 mL injection Inject 0.3 mL (0.3 mg total) into the muscle once as needed for anaphylaxis (Difficulty breathing, throat closing, etc) for up to 1 dose. 1 each 12    escitalopram oxalate (LEXAPRO) 10 MG tablet 1/2 tab PO Daily for 6 days. If well tolerated, increase to 1 whole tablet PO daily. 30 tablet 1    estradiol (ESTRACE) 1 MG tablet Take 1 tablet (1 mg total) by mouth daily. 90 tablet 3    famotidine (PEPCID) 20 MG tablet Take 1 tablet (20 mg total) by mouth two (2) times a day.      gabapentin (NEURONTIN) 800 MG tablet Take 1 tablet (800 mg total) by mouth two (2) times a day. 180 tablet 0    hydroCHLOROthiazide 12.5 MG tablet TAKE 1 TABLET(12.5 MG) BY MOUTH DAILY AS NEEDED FOR SWELLING 90 tablet 3    hydroxychloroquine (PLAQUENIL) 200 mg tablet Take 1 tablet (200 mg total) by mouth two (2) times a day. 60 tablet 12    ibuprofen (MOTRIN) 800 MG tablet Take 1 tablet (800 mg total) by mouth two (2) times a day as needed for pain. 60 tablet 0    methylPREDNISolone (MEDROL DOSEPACK) 4 mg tablet follow package directions 1 each 0 omalizumab (XOLAIR) 150 mg/mL syringe Inject 2 mL (300 mg total) under the skin every fourteen (14) days. 4 mL 11    omalizumab (XOLAIR) 75 mg/0.5 mL syringe Inject under the skin.      ondansetron (ZOFRAN) 4 MG tablet Take 1 tablet (4 mg total) by mouth every eight (8) hours as needed for nausea (during a migraine). 30 tablet 2    oxyCODONE-acetaminophen (PERCOCET) 10-325 mg per tablet Take 1 tablet by mouth every four (4) hours as needed for pain. Max 4 tabs/day. Fill on or after: 11/03/22. Brand name only due to allergy. 120 tablet 0    oxyCODONE-acetaminophen (PERCOCET) 10-325 mg per tablet Take 1 tablet by mouth every four (4) hours as needed for pain. Max 4 tabs/day. Fill on or after: 01/02/23. Brand name only due to allergy. 120 tablet 0    oxyCODONE-acetaminophen (PERCOCET) 10-325 mg per tablet Take 1 tablet by mouth every four (4) hours as needed for pain. Max 4 tabs/day. Fill on or after: 02/01/23. Brand name only due to allergy. 120 tablet 0    [START ON 03/03/2023] oxyCODONE-acetaminophen (PERCOCET) 10-325 mg per tablet Take 1 tablet by mouth every four (4) hours as needed for pain. Max 4 tabs/day. Fill on or after: 03/03/23. Brand name only due to allergy. 120 tablet 0    oxyCODONE-acetaminophen (PERCOCET) 10-325 mg per tablet Take 1 tablet by mouth every four (4) hours as needed for pain. Max 4 tabs/day. Fill on or after 12/27/22. Partial refill #3 tabs only, pt had # 117 refilled already. Brand name only due to allergy. 3 tablet 0    pantoprazole (PROTONIX) 40 MG tablet TAKE  1 TABLET(40 MG) BY MOUTH DAILY 30 MINUTES BEFORE BREAKFAST AS NEEDED 90 tablet 2    potassium chloride 20 MEQ ER tablet Take 1 tablet (20 mEq total) by mouth daily. 90 tablet 3    PROCHAMBER Spcr       progesterone (PROMETRIUM) 100 MG capsule Take 1 capsule (100 mg total) by mouth nightly. 90 capsule 3    rosuvastatin (CRESTOR) 10 MG tablet Take 1 tablet (10 mg total) by mouth nightly. 90 tablet 3    triamcinolone (KENALOG) 0.1 % cream Apply topically two (2) times a day as needed WHEN ALLERGIES FLARE-UP AND CAUSES RASH / ITCHING 80 g 0    UBRELVY 50 mg tablet TAKE 1 TABLET BY MOUTH FOR HEADACHE. MAY REPEAT IN 2 HOURS IF NEEDED. LIMIT 2 TABLETS PER DAY AND NO MORE THAN 4 TABLETS PER WEEK 16 tablet 3    venlafaxine (EFFEXOR XR) 37.5 MG 24 hr capsule Take 1 capsule (37.5 mg total) by mouth daily for 30 days, THEN 2 capsules (75 mg total) daily. 60 capsule 5    XHANCE 93 mcg/actuation AerB 1 spray into each nostril two (2) times a day as needed. 16 mL 11    amoxicillin (AMOXIL) 500 MG capsule Take 2 capsules (1,000 mg total) by mouth two (2) times a day for 10 days. 40 capsule 0    ipratropium (ATROVENT) 21 mcg (0.03 %) nasal spray 2 sprays into each nostril four (4) times a day. 30 mL 1     Current Facility-Administered Medications   Medication Dose Route Frequency Provider Last Rate Last Admin    omalizumab (XOLAIR) injection 300 mg  300 mg Subcutaneous Q28 Days Volertas, Sofija Dalia, MD   300 mg at 03/09/22 1441       ROS:     All ROS negative unless otherwise noted in HPI.    Vital Signs:     Wt Readings from Last 3 Encounters:   02/23/23 76.7 kg (169 lb)   02/21/23 77 kg (169 lb 12.1 oz)   02/16/23 77.4 kg (170 lb 9.6 oz)     Temp Readings from Last 3 Encounters:   02/23/23 36.5 ??C (97.7 ??F)   02/21/23 36.1 ??C (96.9 ??F) (Temporal)   12/18/22 36.7 ??C (98.1 ??F) (Tympanic)     BP Readings from Last 3 Encounters:   02/23/23 134/84   02/21/23 137/89   02/21/23 130/83     Pulse Readings from Last 3 Encounters:   02/23/23 73   02/21/23 64   02/21/23 74     Body mass index is 31.95 kg/m??.    Objective:      Physical Exam  Vitals and nursing note reviewed.   Constitutional:       General: She is not in acute distress.     Appearance: Normal appearance. She is not ill-appearing or diaphoretic.   HENT:      Right Ear: External ear normal. No tenderness. Tympanic membrane is not injected.      Left Ear: External ear normal. A middle ear effusion is present. Tympanic membrane is erythematous.      Ears:      Comments: Bilateral TM are dull in appearance.  Left TM is effused and external ear is tender to touch.     Nose:      Right Turbinates: Enlarged.      Left Turbinates: Enlarged.      Left Sinus: Maxillary sinus tenderness and frontal sinus tenderness present.  Comments: Bilateral turbinate hypertrophy.     Mouth/Throat:      Pharynx: Oropharynx is clear. No posterior oropharyngeal erythema.   Eyes:      General: No scleral icterus.     Conjunctiva/sclera: Conjunctivae normal.   Neck:      Thyroid: No thyroid mass, thyromegaly or thyroid tenderness.   Cardiovascular:      Rate and Rhythm: Normal rate and regular rhythm.      Pulses: Normal pulses.      Heart sounds: Normal heart sounds. No murmur heard.  Pulmonary:      Effort: Pulmonary effort is normal. No respiratory distress.      Breath sounds: Normal breath sounds. No wheezing.   Abdominal:      Palpations: Abdomen is soft.      Tenderness: There is no abdominal tenderness. There is left CVA tenderness. There is no right CVA tenderness, guarding or rebound.   Musculoskeletal:      Right lower leg: No edema.      Left lower leg: No edema.   Lymphadenopathy:      Head:      Left side of head: Submandibular adenopathy present.      Comments: Left sided submandibular lymph node, palpable and tende   Neurological:      Mental Status: She is alert and oriented to person, place, and time.   Psychiatric:         Mood and Affect: Mood normal.         Behavior: Behavior normal.         Thought Content: Thought content normal.         Judgment: Judgment normal.           Labs:     No results found for this visit on 02/23/23.    I attest that I, Rayetta Humphrey, personally documented this note while acting as scribe for Royal Hawthorn, MD.      Rayetta Humphrey, Scribe.  02/23/2023     The documentation recorded by the scribe accurately reflects the service I personally performed and the decisions made by me.    Royal Hawthorn, MD

## 2023-02-24 DIAGNOSIS — R350 Frequency of micturition: Principal | ICD-10-CM

## 2023-02-24 DIAGNOSIS — R3915 Urgency of urination: Principal | ICD-10-CM

## 2023-02-24 MED FILL — EMPTY CONTAINER: 120 days supply | Qty: 1 | Fill #1

## 2023-02-24 MED FILL — XOLAIR 150 MG/ML SUBCUTANEOUS SYRINGE: SUBCUTANEOUS | 28 days supply | Qty: 4 | Fill #3

## 2023-03-02 ENCOUNTER — Ambulatory Visit
Admit: 2023-03-02 | Discharge: 2023-03-07 | Payer: MEDICARE | Attending: Rehabilitative and Restorative Service Providers" | Primary: Rehabilitative and Restorative Service Providers"

## 2023-03-02 DIAGNOSIS — M4316 Spondylolisthesis, lumbar region: Principal | ICD-10-CM

## 2023-03-02 DIAGNOSIS — M47812 Spondylosis without myelopathy or radiculopathy, cervical region: Principal | ICD-10-CM
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
## 2023-03-03 MED ORDER — DICLOFENAC 20 MG/GRAM/ACTUATION (2 %) TOPICAL SOLN METERED-DOSE PUMP
Freq: Two times a day (BID) | TOPICAL | 0 refills | 0.00 days
Start: 2023-03-03 — End: ?

## 2023-03-03 MED ORDER — OXYCODONE-ACETAMINOPHEN 10 MG-325 MG TABLET
ORAL_TABLET | ORAL | 0 refills | 20 days | Status: CP | PRN
Start: 2023-03-03 — End: ?

## 2023-03-04 ENCOUNTER — Encounter: Admit: 2023-03-04 | Discharge: 2023-03-05 | Payer: MEDICARE | Attending: Psychologist | Primary: Psychologist

## 2023-03-04 DIAGNOSIS — F1190 Opioid use, unspecified, uncomplicated: Principal | ICD-10-CM

## 2023-03-04 DIAGNOSIS — F4321 Adjustment disorder with depressed mood: Principal | ICD-10-CM

## 2023-03-04 DIAGNOSIS — F419 Anxiety disorder, unspecified: Principal | ICD-10-CM

## 2023-03-04 MED ORDER — DICLOFENAC 20 MG/GRAM/ACTUATION (2 %) TOPICAL SOLN METERED-DOSE PUMP
Freq: Two times a day (BID) | TOPICAL | 0 refills | 0.00 days
Start: 2023-03-04 — End: ?

## 2023-03-04 NOTE — Unmapped (Signed)
Already filled

## 2023-03-04 NOTE — Unmapped (Signed)
[facility]   Confidential Psychological Therapy Session    Patient Name: Natalie [name]  Medical Record Number: [id number]  Date of Service: March 04, 2023  Attending Psychologist: Caroline More, PhD  CPT Procedure Code: 29562 for 45 minutes of face to face counseling    This visit was performed face to face with interactive technology using a HIPPA compliant audio/visual platform. We reviewed confidentiality today. The patient was present in [place], a state in which this provider is licensed and able to provide care (location and contact information confirmed), attended this visit alone, and consented to this virtual pain psychology visit.    REFERRING PHYSICIAN: Criss Rosales, MD    CHIEF COMPLAINT AND REASON FOR VISIT:  COM follow up evaluation/pain coping skills; CBT/ACT for pain; grief    SUBJECTIVE / HISTORY OF PRESENT ILLNESS: [name] is a very pleasant 53 y.o.  female with chronic lower back pain, chronic headaches, and myofascial pain. Her pain started in 1999 in her lower back.  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  She had a spinal fusion in 2001 at the L5-S1 level.  She has had bilateral shoulder pain, neck pain, lower back pain, and b/l knees.  She also described a radiculopathy b/l that goes from her back to her bilateral buttocks and down to her toes.      Pt initially established with Dr. Oda Kilts in 08/2016 and then was was first evaluated by me in 07/2017. Last follow up with me was 02/09/23. Pt had TPI's recently which has helped some with pain, allowing her do do more. Still working 3 jobs. Less stress with sign other. More stress with her oldest son. Reflected on how much time and energy she spends worrying about him and trying to make him accept responsibility. Discussed how this impacts her energy and goals. Discussed what she is worried will happen if she doesn't intervene and how intervening often has undermined his actually taking on independence as a grown adult. Pt reflects on what it would be like if she could focus her time and energy on her own values and goals and not on trying to fix [her] son.    OBJECTIVE / MENTAL STATUS:    Appearance:   Appears stated age and Clean/Neat   Motor:  No abnormal movements   Speech/Language:   Normal rate, volume, tone, fluency   Mood:  Irritable   Affect:  Blunted   Thought process:  Logical, linear, clear, coherent, goal directed   Thought content:    Denies SI, HI, self harm, delusions, obsessions, paranoid ideation, or ideas of reference   Perceptual disturbances:    Denies auditory and visual hallucinations, behavior not concerning for response to internal stimuli   Orientation:  Oriented to person, place, time, and general circumstances   Attention:  Able to fully attend without fluctuations in consciousness   Concentration:  Able to fully concentrate and attend   Memory:  Immediate, short-term, long-term, and recall grossly intact    Fund of knowledge:   Consistent with level of education and development   Insight:    Fair   Judgment:   Intact   Impulse Control:  Intact       DIAGNOSTIC IMPRESSION:   Anxiety NOS  Chronic continuou use of opiates  Grief    ASSESSMENT:   [name] is a very pleasant 53 y.o.  female from [place], [place] with  chronic lower back pain, chronic headaches, and myofascial pain. Her pain started in 1999 in her lower back.  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  She had a spinal fusion in 2001 at the L5-S1 level.  She has had bilateral shoulder pain, neck pain, lower back pain, and b/l knees.  She also described a radiculopathy b/l that goes from her back to her bilateral buttocks and down to her toes.  The patient is currently considered to be high risk due to prior nonadherence, but appropriate with a behavioral adherence plan in place.  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 She continues to participate actively in psychotherapy.    PLAN:   (1) COM - high risk but remains appropriate with a behavioral adherence plan also in place.    -Patient MUST meet with pain psychology/me once a month.   -No additional overuse of opioids will be tolerated.    -Patient should always bring her medication to clinic for pill counts.  -Patient was informed she has had numerous infractions with respect to her pain contract over the years, and that no further infractions will be tolerated.    If any additional pain contract or behavioral adherence contract infractions occur, I recommend stopping opioids.    Pt doing well with adherence, per our last few visits.     (2) Pain coping skills- addressed compliance, as well as pacing and mindful breathing, body scan, and problem solving    (3) follow-in 1 month

## 2023-03-07 MED ORDER — BACLOFEN 20 MG TABLET
ORAL_TABLET | 2 refills | 0.00 days
Start: 2023-03-07 — End: ?

## 2023-03-07 MED ORDER — GABAPENTIN 800 MG TABLET
ORAL_TABLET | Freq: Two times a day (BID) | ORAL | 0 refills | 90.00 days
Start: 2023-03-07 — End: ?

## 2023-03-07 NOTE — Unmapped (Signed)
Spoke with the patient - she stated her tongue feel thick when she talks, swelling on left side of her neck, Recommended she be seen in the emergency room with her neck swelling and feeling like her tongue is swelling/thick feeling. She stated she didn't feel like it is an emergency, discussed if she tongue is swelling and her neck she does not want it to continue to swell that she would have difficulty breathing. She verbalized understanding and stated she will go to the emergency room to be evaluated.
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
## 2023-03-07 NOTE — Unmapped (Signed)
Upcoming Appt:  Future Appointments   Date Time Provider Department Center   03/08/2023  8:30 AM Galen Daft, FNP ANESPAINMRKT [facility]   03/08/2023  3:00 PM Darliss Cheney, PTA UNCCAOSMSPPT [facility]   03/10/2023  3:00 PM Darliss Cheney, PTA [facility]   04/04/2023  9:00 AM Jyl Heinz UNCUROSVCET [facility]   04/15/2023 12:00 PM Goetzinger, Mardene Celeste, PhD ANESPAINMRKT [facility]   04/18/2023  2:00 PM Reinke, Merrilee Seashore, MD [facility]   04/19/2023  8:40 AM Tamsen Roers, MD NCNEULBT [facility]       Disposition:  Call EMS 911 Now    Is this a pediatric patient?   No   Any recent, relevant visit?   Yes   Date/location of recent visit?  02-23-23 ear infection    Any related medications?  Yes   Name of medication and dose?  Amoxicillin finished meds    Any relevant medical history?   No    Any interventions?   Yes   What has been done? (include related medication given, dose, and date/time)  dayquil    Reason for Disposition   Neck swelling    Answer Assessment - Initial Assessment Questions  1. ONSET: When did the swelling start? (e.g., minutes, hours, days)      03-04-23  2. LOCATION: What part of the tongue is swollen?      Left side looks larger than the right,pt reports feels like her tongue is thick  3. SEVERITY: How swollen is it?      Mild to moderate  4. CAUSE: What do you think is causing the tongue swelling? (e.g., history of angioedema, allergies)      Pt unsure  5. RECURRENT SYMPTOM: Have you had tongue swelling before? If Yes, ask: When was the last time? What happened that time?      Denies  6. OTHER SYMPTOMS: Do you have any other symptoms? (e.g., breathing difficulty, facial swelling)      Left neck is swollen,pt reports feels like her throat is closing up,feels like there is a lump in her throat.  7. PREGNANCY: Is there any chance you are pregnant? When was your last menstrual period?      N/A    Protocols used: Tongue Swelling-A-AH

## 2023-03-08 ENCOUNTER — Ambulatory Visit: Admit: 2023-03-08 | Discharge: 2023-03-09 | Payer: MEDICARE

## 2023-03-08 ENCOUNTER — Ambulatory Visit
Admit: 2023-03-08 | Payer: MEDICARE | Attending: Rehabilitative and Restorative Service Providers" | Primary: Rehabilitative and Restorative Service Providers"

## 2023-03-08 DIAGNOSIS — M7918 Myalgia, other site: Principal | ICD-10-CM

## 2023-03-08 DIAGNOSIS — M5417 Radiculopathy, lumbosacral region: Principal | ICD-10-CM

## 2023-03-08 DIAGNOSIS — M170 Bilateral primary osteoarthritis of knee: Principal | ICD-10-CM

## 2023-03-08 DIAGNOSIS — M47812 Spondylosis without myelopathy or radiculopathy, cervical region: Principal | ICD-10-CM

## 2023-03-08 DIAGNOSIS — M47816 Spondylosis without myelopathy or radiculopathy, lumbar region: Principal | ICD-10-CM

## 2023-03-08 MED ORDER — BACLOFEN 20 MG TABLET
ORAL_TABLET | 2 refills | 0.00 days | Status: CP
Start: 2023-03-08 — End: ?

## 2023-03-08 MED ORDER — GABAPENTIN 800 MG TABLET
ORAL_TABLET | Freq: Two times a day (BID) | ORAL | 0 refills | 90.00 days | Status: CP
Start: 2023-03-08 — End: ?

## 2023-03-08 NOTE — Unmapped (Signed)
Patient declined printed AVS but can review on MyChart. Patient voiced understanding.

## 2023-03-08 NOTE — Unmapped (Signed)
Medication: Percocet 10-325 mg   SIG: Take 1 tablet by mouth every 4 hours as needed for pain  Quantity on RX: # 120  Filled on: 03/03/23  Pill count today: # 96

## 2023-03-09 ENCOUNTER — Ambulatory Visit: Admit: 2023-03-09 | Discharge: 2023-03-11 | Payer: MEDICARE

## 2023-03-09 ENCOUNTER — Ambulatory Visit
Admit: 2023-03-09 | Discharge: 2023-03-11 | Payer: MEDICARE | Attending: Rehabilitative and Restorative Service Providers" | Primary: Rehabilitative and Restorative Service Providers"

## 2023-03-09 ENCOUNTER — Ambulatory Visit: Admit: 2023-03-09 | Payer: MEDICARE

## 2023-03-09 DIAGNOSIS — M47812 Spondylosis without myelopathy or radiculopathy, cervical region: Principal | ICD-10-CM
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
## 2023-03-19 DIAGNOSIS — M170 Bilateral primary osteoarthritis of knee: Principal | ICD-10-CM

## 2023-03-19 MED ORDER — DICLOFENAC 20 MG/GRAM/ACTUATION (2 %) TOPICAL SOLN METERED-DOSE PUMP
0 refills | 0.00 days
Start: 2023-03-19 — End: ?

## 2023-03-21 MED ORDER — DICLOFENAC 20 MG/GRAM/ACTUATION (2 %) TOPICAL SOLN METERED-DOSE PUMP
0 refills | 0.00 days
Start: 2023-03-21 — End: ?

## 2023-03-21 NOTE — Unmapped (Signed)
 [facility] Refill Coordination Note    Natalie [name], [name]: October 13, 1970  Phone: [phone] (home)       All above HIPAA information was verified with patient.         03/19/2023    10:08 AM   Specialty Rx Medication Refill Questionnaire   Which Medications would you like refilled and shipped? Xolair   Please list all current allergies: No new allergies   Have you missed any doses in the last 30 days? No   Have you had any changes to your medication(s) since your last refill? No   How many days remaining of each medication do you have at home? 1 week   If receiving an injectable medication, next injection date is 03/26/2023   Have you experienced any side effects in the last 30 days? No   Please enter the full address (street address, city, state, zip code) where you would like your medication(s) to be delivered to. [address] , [place] [address]   Please specify on which day you would like your medication(s) to arrive. Note: if you need your medication(s) within 3 days, please call the pharmacy to schedule your order at [phone]  03/22/2023   Has your insurance changed since your last refill? No   Would you like a pharmacist to call you to discuss your medication(s)? No   Do you require a signature for your package? (Note: if we are billing Medicare Part B or your order contains a controlled substance, we will require a signature) No         Completed refill call assessment today to schedule patient's medication shipment from the [facility] [phone]).  All relevant notes have been reviewed.       Confirmed patient received a Conservation officer, historic buildings and a Surveyor, mining with first shipment. The patient will receive a drug information handout for each medication shipped and additional FDA Medication Guides as required.         REFERRAL TO PHARMACIST     Referral to the pharmacist: Not needed      [facility]     Shipping address confirmed in Epic.     Delivery Scheduled: Yes, Expected medication delivery date: 03/22/23.     Medication will be delivered via Same Day Courier to the prescription address in Epic WAM.    Unk Lightning   [facility] Specialty Technician

## 2023-03-22 MED FILL — XOLAIR 150 MG/ML SUBCUTANEOUS SYRINGE: SUBCUTANEOUS | 28 days supply | Qty: 4 | Fill #4

## 2023-03-23 DIAGNOSIS — M545 Low back pain, unspecified back pain laterality, unspecified chronicity, unspecified whether sciatica present: Principal | ICD-10-CM

## 2023-03-23 DIAGNOSIS — M4316 Spondylolisthesis, lumbar region: Principal | ICD-10-CM

## 2023-03-23 DIAGNOSIS — M546 Pain in thoracic spine: Principal | ICD-10-CM
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
## 2023-03-30 DIAGNOSIS — M47812 Spondylosis without myelopathy or radiculopathy, cervical region: Principal | ICD-10-CM
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
## 2023-04-02 DIAGNOSIS — M170 Bilateral primary osteoarthritis of knee: Principal | ICD-10-CM

## 2023-04-02 MED ORDER — OXYCODONE-ACETAMINOPHEN 10 MG-325 MG TABLET
ORAL_TABLET | ORAL | 0 refills | 20.00000 days | Status: CP | PRN
Start: 2023-04-02 — End: ?

## 2023-04-02 MED ORDER — DICLOFENAC 20 MG/GRAM/ACTUATION (2 %) TOPICAL SOLN METERED-DOSE PUMP
0 refills | 0.00 days
Start: 2023-04-02 — End: ?

## 2023-04-04 MED ORDER — DICLOFENAC 20 MG/GRAM/ACTUATION (2 %) TOPICAL SOLN METERED-DOSE PUMP
Freq: Two times a day (BID) | TOPICAL | 0 refills | 0.00 days | Status: CP
Start: 2023-04-04 — End: ?

## 2023-04-10 NOTE — Unmapped (Unsigned)
 REFERRING PROVIDER:  Alona Bene*    ASSESSMENT:  Natalie [name] is a 53 y.o. female with microscopic hematuria.    The 2020 AUA guidelines for classification and work-up for microscopic hematuria were discussed in detail as well as causes of hematuria.      ***The patient has a formal urinalysis that demonstrates >=3 RBC/HPF, and therefore meets the definition of microscopic hematuria.     ***A formal urinalysis was sent today. If she does not meet the criteria for microscopic hematuria (defined as >=3 RBC/HPF) from the microscopic urinalysis sent today, then formal evaluation for hematuria is unnecessary.    Based on the AUA microhematuria risk stratification system below, this patient is considered to be {DESC;LOW/INTERMEDIATE/HIGH:32201} risk.       ***For low risk patients, recommendation includes shared decision-making to decide between repeating urinalysis within 6 months or proceeding with cystoscopy and renal ultrasound. After discussion, the patient wishes to proceed with {ABS LOW RISK HEM ZOXWRUE:45409}    ***For intermediate risk patients, recommendation includes cystoscopy and renal utrasound.    ***For high risk patients, recommendation includes cystoscopy with axial upper tract imaging. For patients with no contraindications to its use, CT urogram is recommended. If contraindications exist, MR urography is the next best option. If contraindications exist for both CTU and MRU, then retrograde urogram with non-contrast axial imaging or renal ultrasound is recommended.     We did discuss other contributing factors in the workup of microscopic hematuria, including family history of RCC (which necessitates upper tract imaging regardless of risk category), and the use of cytology (which is not recommended for workup of initial microscopic hematuria but could be considered for persistent AMH after a negative workup or irritative voiding symptoms suggestive of possible CIS).     Finally, we discussed follow-up if the workup described above is negative. A repeat formal urinalysis maybe obtained within 12 months. If the subsequent urinalysis is negative (and the initial workup was negative), evaluation for microscopic hematuria may be discontinued. For those with persistent microscopic hematuria, shared decision-making will be needed to decide upon additional microscopic hematuria evaluation. Should the patient ever develop gross hematuria following a negative workup for microscopic hematuria, she was counseled to return to our clinic for evaluation.     All questions were answered today.    PLAN:  {ABS MICRO HEM WORKUP OPTIONS:68889}    It was a pleasure seeing this {Blank single:19197::gentleman,woman} in my clinic today. I personally spent *** minutes face-to-face and non-face-to-face in the care of this patient, which includes all pre, intra, and post visit time on the date of service.      REASON FOR VISIT: microscopic hematuria.    HISTORY OF PRESENT ILLNESS:  Natalie [name] is a pleasant 53 y.o. female who is being seen today in consultation at the request of Kaddijatou Steele Sizer* for evaluation of microscopic hematuria, and she first noticed this on ***. Since that time, the hematuria {HAS HAS NOT:18834} resolved.    Per review of notes, she has a long history of microscopic hematuria dating back to 2017 per [facility]A.   She has previously been seen by Gerrit Friends NP 12/03/2016 for persistent microhematuria. They discussed CTU and cystoscopy for formal workup. They also discussed her history of kidney stones including stone prevention.   She was seen again as follow up with Gerrit Friends NP 2019 S/P normal cytoscopy 2018 and CT urogram overall ok; punctate stones noted.  They had planned for 1 year follow  up with rus, and nephrology referral for persistent microhematuria for one year but patient has been lost to follow up since.     She did follow up with nephrology, most recent visit 2021: suspect Sickle Cell trait causing her to have microscopic hematuria no follow up since. o  Today ***     Gross hematuria: ***   Urology history: ***  Uro onc FH: ***  Smoking history: ***     04/01/22 UA 23 RBC w/out infection   05/07/22 UA 5 RBC w/out infection  08/25/22 UA 4 RBC w/out infection   10/07/22 1 RBC w/out infection   12/15/22 UA 6 RBC w/out infection    Today, she denies fever, chills, chest pain, shortness of breath, cough, wheezing, nausea, vomiting, abdominal pain, flank pain, diarrhea, constipation, dysuria, hematuria, rashes, open wounds, edema, and weakness or numbness in the upper or lower extremities.     Creatinine, Serum   Date Value Ref Range Status   12/19/2012 0.80 0.50 - 1.10 mg/dL Final   16/10/9602 5.40 0.50 - 1.10 mg/dL Final     Creatinine   Date Value Ref Range Status   10/07/2022 0.75 0.55 - 1.02 mg/dL Final   98/11/9145 8.29 0.55 - 1.02 mg/dL Final   56/21/3086 0.8 0.4 - 1.0 mg/dl Final   57/84/6962 0.7 0.4 - 1.0 mg/dl Final     Blood, UA   Date Value Ref Range Status   12/15/2022 Small (A) Negative Final   10/07/2022 Small (A) Negative Final   08/25/2022 Trace (A) Negative Final   10/28/2015 2+ (A) Negative Final   05/26/2012 Small (A) Negative Final   05/12/2012 Negative Negative Final     Urine Culture, Comprehensive   Date Value Ref Range Status   11/17/2022 NO GROWTH  Final   10/07/2022   Final    Mixed organisms isolated; Mixed organisms suggests poorly collected specimen. Please resubmit if clinically indicated.   08/25/2022 Normal Urogenital Flora  Final     Urine Culture, Routine   Date Value Ref Range Status   10/28/2015 Final report  Final   07/31/2014 Final report  Final     RBC, UA   Date Value Ref Range Status   12/15/2022 6 (H) 0 - 3 /HPF Final   10/07/2022 1 0 - 3 /HPF Final   08/25/2022 4 (H) 0 - 3 /HPF Final       No results found for this or any previous visit.     No results found for this or any previous visit.        All outside records personally reviewed.    PAST MEDICAL HISTORY:   Past Medical History:   Diagnosis Date    Allergic conjunctivitis 11/23/2018    Allergic rhinitis     Anemia 1986    Arthritis     Breast mass     Chondromalacia of left knee     Chondromalacia of right knee     Cluster headache     Depressive disorder     Dizziness     Fibrocystic breast     GERD (gastroesophageal reflux disease)     Headache, tension-type     Hematuria     History of kidney stones     History of transfusion     Spinal Fusion    Hyperlipidemia 1999    Hypothyroidism     Kidney stone     Lumbar disc disease 1998    She was injured at work  at the age of 39 and then had disk surgery 2 years later     Menopause ovarian failure     Menopause, premature     Migraine with aura     Neuromuscular disorder (CMS-HCC) 1999    Obesity (BMI 30-39.9) 11/23/2018    Ovarian cyst     Plantar fasciitis     Pseudotumor cerebri     Rash due to allergy 11/23/2018    Scoliosis     Sickle cell trait (CMS-HCC)     Sleep apnea     Urinary tract infection     Vaginitis        PAST SURGICAL HISTORY:   Past Surgical History:   Procedure Laterality Date    BACK SURGERY  2001    BREAST BIOPSY Bilateral     When patient was 16 & 17 Both Negative    EPIDURAL BLOCK INJECTION      HYSTERECTOMY  2008    PR REMOVAL OF TONSILS,13+ Y/O Bilateral 10/10/2019    Procedure: TONSILLECTOMY;  Surgeon: Lona Millard, MD;  Location: OR [place];  Service: ENT    SALPINGOOPHORECTOMY Bilateral 2007    SPINAL FUSION      SPINE SURGERY  2001    TOTAL VAGINAL HYSTERECTOMY  01/04/2006    Uterine Prolapse-Dr. Leeanne Rio OP Note    TRIGGER POINT INJECTION      TUBAL LIGATION  1995       FAMILY HISTORY:   Family History   Problem Relation Age of Onset    Hypertension Mother     Heart disease Mother     Arthritis Mother     Depression Mother     Drug abuse Mother     Mental illness Mother     Ulcers Mother     COPD Mother     Allergic rhinitis Mother     Heart attack Father     Mental illness Father Asthma Father     Cancer Sister         Lymphoma    Heart attack Maternal Grandmother     Heart disease Maternal Grandmother     Hypertension Maternal Grandmother     Thyroid disease Maternal Grandmother     Stroke Paternal Grandfather     Depression Son     Drug abuse Son     Mental illness Son     COPD Maternal Aunt     Heart attack Cousin 55    Breast cancer Cousin 32    Thyroid disease Other     Cancer Sister     COPD Maternal Aunt     Migraines Maternal Aunt     Depression Son     Drug abuse Son     Mental illness Son     Alcohol abuse Neg Hx        SOCIAL HISTORY:    reports that she has never smoked. She has been exposed to tobacco smoke. She has never used smokeless tobacco. She reports current alcohol use. She reports that she does not use drugs.    MEDICATIONS:   Current Outpatient Medications   Medication Sig Dispense Refill    albuterol HFA 90 mcg/actuation inhaler Inhale 2 puffs every four (4) hours as needed for wheezing. 54 g 0    albuterol HFA 90 mcg/actuation inhaler Inhale 2 puffs every six (6) hours as needed for wheezing. 54 g 0    albuterol HFA 90 mcg/actuation inhaler Inhale 2 puffs every six (6)  hours as needed for wheezing or shortness of breath. 54 g 0    azelastine (ASTELIN) 137 mcg (0.1 %) nasal spray 2 sprays into each nostril two (2) times a day. 30 mL 5    azelastine (OPTIVAR) 0.05 % ophthalmic solution Administer 2 drops to both eyes daily as needed. 6 mL 5    baclofen (LIORESAL) 20 MG tablet 10 - 20 mg three times a day as needed for spasms. 90 tablet 2    cetirizine (ZYRTEC) 10 MG chewable tablet Chew 1 tablet (10 mg total) at bedtime as needed for allergies.      diclofenac sodium 20 mg/gram /actuation(2 %) sopm Apply 40 mg topically two (2) times a day. 112 g 0    diclofenac sodium 20 mg/gram /actuation(2 %) sopm Apply 40 mg topically two (2) times a day. 112 g 0    empty container Misc Use as directed to dispose of Xolair syringes 1 each 2    EPINEPHrine (EPIPEN) 0.3 mg/0.3 mL injection Inject 0.3 mL (0.3 mg total) into the muscle once as needed for anaphylaxis (Difficulty breathing, throat closing, etc) for up to 1 dose. 1 each 12    escitalopram oxalate (LEXAPRO) 10 MG tablet 1/2 tab PO Daily for 6 days. If well tolerated, increase to 1 whole tablet PO daily. 30 tablet 1    estradiol (ESTRACE) 1 MG tablet Take 1 tablet (1 mg total) by mouth daily. 90 tablet 3    gabapentin (NEURONTIN) 800 MG tablet Take 1 tablet (800 mg total) by mouth two (2) times a day. 180 tablet 0    hydroCHLOROthiazide 12.5 MG tablet TAKE 1 TABLET(12.5 MG) BY MOUTH DAILY AS NEEDED FOR SWELLING 90 tablet 3    hydroxychloroquine (PLAQUENIL) 200 mg tablet Take 1 tablet (200 mg total) by mouth two (2) times a day. 60 tablet 12    ibuprofen (MOTRIN) 800 MG tablet Take 1 tablet (800 mg total) by mouth two (2) times a day as needed for pain. 60 tablet 0    ipratropium (ATROVENT) 21 mcg (0.03 %) nasal spray 2 sprays into each nostril four (4) times a day. 30 mL 1    methylPREDNISolone (MEDROL DOSEPACK) 4 mg tablet follow package directions 1 each 0    omalizumab (XOLAIR) 150 mg/mL syringe Inject 2 mL (300 mg total) under the skin every fourteen (14) days. 4 mL 11    omalizumab (XOLAIR) 75 mg/0.5 mL syringe Inject under the skin.      oxyCODONE-acetaminophen (PERCOCET) 10-325 mg per tablet Take 1 tablet by mouth every four (4) hours as needed for pain. Max 4 tabs/day. Fill on or after: 04/02/23. Brand name only due to allergy. 120 tablet 0    [START ON 05/02/2023] oxyCODONE-acetaminophen (PERCOCET) 10-325 mg per tablet Take 1 tablet by mouth every four (4) hours as needed for pain. Max 4 tabs/day. Fill on or after: 05/02/23. Brand name only due to allergy. 120 tablet 0    [START ON 06/01/2023] oxyCODONE-acetaminophen (PERCOCET) 10-325 mg per tablet Take 1 tablet by mouth every four (4) hours as needed for pain. Max 4 tabs/day. Fill on or after: 06/01/23. Brand name only due to allergy. 120 tablet 0    pantoprazole (PROTONIX) 40 MG tablet TAKE 1 TABLET(40 MG) BY MOUTH DAILY 30 MINUTES BEFORE BREAKFAST AS NEEDED 90 tablet 2    potassium chloride 20 MEQ ER tablet Take 1 tablet (20 mEq total) by mouth daily. 90 tablet 3    PROCHAMBER Spcr  progesterone (PROMETRIUM) 100 MG capsule Take 1 capsule (100 mg total) by mouth nightly. 90 capsule 3    rosuvastatin (CRESTOR) 10 MG tablet Take 1 tablet (10 mg total) by mouth nightly. 90 tablet 3    triamcinolone (KENALOG) 0.1 % cream Apply topically two (2) times a day as needed WHEN ALLERGIES FLARE-UP AND CAUSES RASH / ITCHING 80 g 0    UBRELVY 50 mg tablet TAKE 1 TABLET BY MOUTH FOR HEADACHE. MAY REPEAT IN 2 HOURS IF NEEDED. LIMIT 2 TABLETS PER DAY AND NO MORE THAN 4 TABLETS PER WEEK 16 tablet 3    venlafaxine (EFFEXOR XR) 37.5 MG 24 hr capsule Take 1 capsule (37.5 mg total) by mouth daily for 30 days, THEN 2 capsules (75 mg total) daily. 60 capsule 5    XHANCE 93 mcg/actuation AerB 1 spray into each nostril two (2) times a day as needed. 16 mL 11     Current Facility-Administered Medications   Medication Dose Route Frequency Provider Last Rate Last Admin    omalizumab Geoffry Paradise) injection 300 mg  300 mg Subcutaneous Q28 Days Volertas, Sofija Dalia, MD   300 mg at 03/09/22 1441       ALLERGIES:    Allergies   Allergen Reactions    Buprenorphine Hcl Itching and Swelling    Pregabalin Swelling    Buprenorphine     Cephalexin Monohydrate     Doxycycline     Oxycodone Hcl     Adhesive Tape-Silicones Itching     Band-aids ok.    Doxycycline Hyclate (Bulk) Nausea And Vomiting     GI Upset    Keflex [Cephalexin] Rash    Opioids - Morphine Analogues Itching    Oxycodone-Acetaminophen Itching and Nausea And Vomiting     GI Upset- GENERIC ONLY- able to take the brand name Percocets       REVIEW OF SYSTEMS: {ABS REVIEW OF SYSTEMS:23552}    PHYSICAL EXAMINATION:   GENERAL: Pleasant female in no acute distress.   VITAL SIGNS: not currently breastfeeding.  HEENT: Normocephalic, atraumatic, extraocular muscles intact  CARDIOVASCULAR: No peripheral edema  PULMONARY: Normal work of breathing, no use of accessory muscles  EXTREMITIES: No clubbing, cyanosis or edema.   NEUROLOGIC:  Cranial nerves II-XII grossly intact  PSYCHOLOGIC: Normal affect, normal mood  SKIN: Warm and dry. No lesions.    LABS:  Results for orders placed or performed in visit on 12/27/22   Drug Screen, [facility], Urine   Result Value Ref Range    Current Medications Not provided     Creatinine-Adulterants 121.1 mg/dL    Specific Gravity-Adulterants 1.022     Ph-Adultertants 5.7     Oxidants-Adulterants Negative Cutoff: 200 mg/L    Comment, Urine Normal     Urine Barbiturate Screen Negative Cutoff: 200 ng/mL    Cocaine Metabolites, Ur Negative Cutoff: 150 ng/mL    THC, Screen Urine Negative Cutoff: 50 ng/mL    Codeine, Ur Not Detected Cutoff: 25 ng/mL    Codeine-6-beta-glucuronide, Ur Not Detected Cutoff: 100 ng/mL    Morphine, Ur Not Detected Cutoff: 25 ng/mL    Morphine-6-beta-glucuronide, Ur Not Detected Cutoff: 100 ng/mL    6-monoacetylmorphine, Ur Not Detected Cutoff: 25 ng/mL    Hydrocodone, Ur Not Detected Cutoff: 25 ng/mL    Norhydrocodone, Ur Not Detected Cutoff: 25 ng/mL    Dihydrocodeine, Ur Not Detected Cutoff: 25 ng/mL    Hydromorphone, Ur Not Detected Cutoff: 25 ng/mL    Hydromorphone-3-beta-glucuronide, Ur Not Detected Cutoff:  100 ng/mL    Oxycodone, Ur Present (A) Cutoff: 25 ng/mL    Noroxycodone, Ur Present (A) Cutoff: 25 ng/mL    Oxymorphone, Ur Present (A) Cutoff: 25 ng/mL    Oxymorphone-3-beta-glucuronide, Ur Present (A) Cutoff: 100 ng/mL    Noroxymorphone, Ur Present (A) Cutoff: 25 ng/mL    Fentanyl, Ur Not Detected Cutoff: 2 ng/mL    Norfentanyl, Ur Not Detected Cutoff: 2 ng/mL    Meperidine, Ur Not Detected Cutoff: 25 ng/mL    Normeperidine, Ur Not Detected Cutoff: 25 ng/mL    Naloxone, Ur Not Detected Cutoff: 25 ng/mL    Naloxone-3-beta-glucuronide, Ur Not Detected Cutoff: 100 ng/mL    Methadone, Ur Not Detected Cutoff: 25 ng/mL    EDDP, Ur Not Detected Cutoff: 25 ng/mL    Propoxyphene, Ur Not Detected Cutoff: 25 ng/mL    Norpropoxyphene, Ur Not Detected Cutoff: 25 ng/mL    Tramadol, Ur Not Detected Cutoff: 25 ng/mL    O-desmethyltramadol, Ur Not Detected Cutoff: 25 ng/mL    Tapentadol, Ur Not Detected Cutoff: 25 ng/mL    N-desmethyltapentadol, Ur Not Detected Cutoff: 50 ng/mL    Tapentadol-beta-glucuronide, Ur Not Detected Cutoff: 100 ng/mL    Buprenorphine, Ur Not Detected Cutoff: 5 ng/mL    Norbuprenorphine, Ur Not Detected Cutoff: 5 ng/mL    Norbuprenorphine glucuronide, Ur Not Detected Cutoff: 20 ng/mL    Opioid Interpretation, Ur SEE COMMENTS     Alprazolam, Ur Not Detected Cutoff: 10 ng/mL    Alpha-Hydroxyalprazolam, Ur Not Detected Cutoff: 10 ng/mL    Alpha-Hydroxyalprazolam Glucuronide, Ur Not Detected Cutoff: 50 ng/mL    Chlordiazepoxide, Ur Not Detected Cutoff: 10 ng/mL    Clobazam, Ur Not Detected Cutoff: 10 ng/mL    N-Desmethylclobazam, Ur Not Detected Cutoff: 200 ng/mL    Clonazepam, Ur Not Detected Cutoff: 10 ng/mL    7-aminoclonazepam, Ur Not Detected Cutoff: 10 ng/mL    Diazepam, Ur Not Detected Cutoff: 10 ng/mL    Nordiazepam, Ur Not Detected Cutoff: 10 ng/mL    Flunitrazepam, Ur Not Detected Cutoff: 10 ng/mL    7-aminoflunitrazepam, Ur Not Detected Cutoff: 10 ng/mL    Flurazepam, Ur Not Detected Cutoff: 10 ng/mL    2-Hydroxy Ethyl Flurazepam, Ur Not Detected Cutoff: 10 ng/mL    Lorazepam, Ur Not Detected Cutoff: 10 ng/mL    Lorazepam Glucuronide, Ur Not Detected Cutoff: 50 ng/mL    Midazolam, Ur Not Detected Cutoff: 10 ng/mL    Alpha-Hydroxy Midazolam, Ur Not Detected Cutoff: 10 ng/mL    Oxazepam, Ur Not Detected Cutoff: 10 ng/mL    Oxazepam Glucuronide, Ur Not Detected Cutoff: 50 ng/mL    Prazepam, Ur Not Detected Cutoff: 10 ng/mL    Temazepam, Ur Not Detected Cutoff: 10 ng/mL    Temazepam Glucuronide, Ur Not Detected Cutoff: 50 ng/mL    Triazolam, Ur Not Detected Cutoff: 10 ng/mL    Alpha-Hydroxy Triazolam, Ur Not Detected Cutoff: 10 ng/mL    Zolpidem, Ur Not Detected Cutoff: 10 ng/mL    Zolpidem Phenyl-4-Carboxylic acid, Ur Not Detected Cutoff: 10 ng/mL    Benzodiazepine Interpretation SEE COMMENTS     Methamphetamine, Ur Not Detected Cutoff: 100 ng/mL    Amphetamine, Ur Not Detected Cutoff: 100 ng/mL    MDMA, Ur Not Detected Cutoff: 100 ng/mL    MDEA, Ur Not Detected Cutoff: 100 ng/mL    MDA, Ur Not Detected Cutoff: 100 ng/mL    Ephedrine, Ur Not Detected Cutoff: 100 ng/mL    Pseudoephedrine, Ur Not Detected Cutoff: 100 ng/mL  Phentermine, Ur Not Detected Cutoff: 100 ng/mL    Phencyclidine, Ur Not Detected Cutoff: 20 ng/mL    Methylphenidate, Ur Not Detected Cutoff: 20 ng/mL    Ritalinic Acid, Ur Not Detected Cutoff: 100 ng/mL    Stimulant Interpretation SEE COMMENTS      *Note: Due to a large number of results and/or encounters for the requested time period, some results have not been displayed. A complete set of results can be found in Results Review.

## 2023-04-11 ENCOUNTER — Ambulatory Visit: Admit: 2023-04-11 | Payer: MEDICARE

## 2023-04-11 ENCOUNTER — Ambulatory Visit
Admit: 2023-04-11 | Payer: MEDICARE | Attending: Rehabilitative and Restorative Service Providers" | Primary: Rehabilitative and Restorative Service Providers"

## 2023-04-11 DIAGNOSIS — M47812 Spondylosis without myelopathy or radiculopathy, cervical region: Principal | ICD-10-CM

## 2023-04-11 NOTE — Unmapped (Signed)
 Pre-procedure call made and RN spoke with patient    Recent infection/antibiotics. No    Are you diabetic? No Monitor Blood Glucose Daily? No    Taking any recent blood thinners/NSAIDs. No    Any electronic implants no    Pregnancy No    Driver necessary Yes    Patient informed to arrive 30 minutes before procedure appointment time.  Patient verbalized understanding to all.
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
## 2023-04-12 NOTE — Unmapped (Addendum)
 PROCEDURE;  Radiofrequency Ablation Of bilateral C4, C5, C6Cervical Medial Branch Nerves    PRE-PROCEDURE DIAGNOSIS: Cervical Spondylosis without myelopathy or radiculopathy  M47.812  POST-PROCEDURE DIAGNOSIS:  Same    PERFORMED BY: Dr. Criss Rosales  ASSISTANT: Dr. Allena Katz  Anesthesia: Local and IV Fentanyl    Natalie [name] is a 53 y.o. female with Cervical spondylosis without myelopathy s/p diagnostic medial branch blocks with good but temporary relief of pain, confirming the facet joints as the source.  We will plan to proceed with bilateral C4, C5, C6 medial branch nerve RFA  upon approval.  After failing conservative measures, through the process of shared decision making, we have decided to move forward with the below procedure.     Cervicalgia Worsened; wishes to repeat cervical MB RFA.  Procedure ordered: bilateral C4, C5, C6 medial branch nerve RFA  Diagnosis: Cervical spondylosis  Is the associated diagnosis spondylosis without myelopathy or radiculopathy? Yes.   Had previously?: Yes. Last: 05/11/2022  If yes, results (% relief or %improvement in function and duration): >80% relief for 8 months; consistent improvement of ADL???s and ability to teach water aerobics.   Have conservative options been tried within the past 12 months, including diagnosis-specific physical therapy or home exercise program for at least 6 weeks: Yes.   HEP: Yes. 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 Instructions? N/A  Labs required?: No.  Driver: Yes.  PIV: Yes.  Risk category (consider age, hx bleeding issues, anticoagulants, liver dx, kidney dx): intermediate  Expected timing: Next available  Pertinent physical exam findings: +pain on facet loading bilaterally cervical region.   Length of procedure: 60 minutes    DESCRIPTION OF THE PROCEDURE:     Informed consent was obtained and potential risks discussed including, but not limited to: bleeding, bruising, severe allergic reaction to components of the injection materials, compression of the spinal cord, infection (superficial, deep, abscess and meningitis), nerve or spinal cord damage, paralysis, inability to place the needle properly, arachnoiditis, the possibility of no benefit (pain relief) derived from the injection, or in rare occasions worsening of pain. Questions were answered to the patient's satisfaction and the patient wishes to proceed. Alternative options for treatment have previously been discussed and explored with the patient. The patient was marked on the appropriate side.    The patient is not taking antiplatelet or anticoagulation medications and does  have a driver today.  A PIV was placed in the upper extremity.    After informed consent was obtained the patient was placed in prone  position on the fluoroscopy table and all pressure points padded. Standard ASA monitors were applied. A timeout protocol was performed. Hand washing with antibacterial soap and water and/or the use of alcohol based cleanser was performed. Proper protective gear was worn by the physician including a mask, scrub cap, and sterile gloves.    The patient's neck and lateral to the mastoid processes was prepped with chloroprep and draped in a sterile fashion. Skin and subcutaneous tissue overlying the intended target areas were anesthetized with small amount of 0.5% lidocaine. Then  20 gauge 10 cm long curved tip radiofrequency cannula with a 10 mm active tip was advanced in initial antero/posterior then lateral fluoroscopic view towards the C4, C5, C6  articular pillars. Once the center of each pillar was reached the cannulas were stimulated. When we tested C4 cannula, patient had bilateral arm motor stimulation. At that time  AP view was obtatined to determine the location of the tip of the needle and C4 needle tip was found to be towards the midline and CSF was also encountered in the needle. C4 needle was removed. We re approached the procedure with AP and lateral view. Once again the center of each pillar was reached at C4, C5, C6 level the cannulas were stimulated.    With impedance greater than 200 0hms, each cannula was stimulated at 50 Hz up to 1 volt. The needle position was redefined such that the patient experienced aching, burning, or tingling back pain. At each level this was obtained at approximately below 1 volt at 50 Hz.     Motor stimulation at each level was then undertaken using 2 Hz at up to three times the sensory voltage, and the patient exhibited no radicular motor stimulation with final needle placement. At this point, after negative aspiration for heme/CSF,  1 ml of 2% lidocaine was injected at each level. One hundred twenty seconds later each level was heated to 80 degrees C for 90 seconds using radiofrequency generator and the needles were removed intact.    The procedure was then repeated in an identical fashion on the contralateral side.  Left side was unremarkable.        The patient did tolerate the procedure fairly and there were no apparent complications except CSF encounter as described above. All injection sites were sterilely dressed. A neurological assessment 15 minutes following the procedure was unchanged. The patient was discharged after an appropriate period of observation and post procedural education was given. We had discussed with the patient about possibility of PDPH. She will contact us know if she has any new neurological symptoms.    Post procedure - normal motor strength all 4 extremities, normal gait. Normal sensation except mildly diminished LEFT UE and b/l LE which is chronic per patient.    Total Fluoroscopic time  90  seconds.  Pre-procedure pain score was 6/10  Post-procedure pain score is 8/10    DISPO:    Has follow up on 5/7 (clinic)

## 2023-04-12 NOTE — Unmapped (Signed)
POST PROCEDURE INSTRUCTIONS   You may apply an ice pack 20-30 minutes at a time to the injection site if you experience soreness.     Keep the injection site clean and dry. You make remove the band-aid one day following the procedure.     You may take a shower but AVOID getting in to baths, pools or whirlpools for 48 HOURS AFTER THE PROCEDURE.       ACTIVITY   Refrain from heavy activity for the next 24 to 48 hours. General walking is okay. You may resume your normal activities the day following the procedure.   You may start or resume your individualized exercise program or physical therapy 48 hours after the procedure.  IF YOU'VE HAD TRIGGER POINT INJECTIONS, YOU MAY CONTINUE EXERCISE OR PHYSICAL THERAPY WITHOUT DELAY.  MEDICATIONS   Please note that it is okay to continue other prescribed medications (blood pressure, insulin, water pill, depression/anxiety pill, etc.) as well as other prescribed pain medications such as Neurontin, Lyrical, Celebrex, Ultram, Vicodin, Norco and acetaminophen (Tylenol).   SIDE EFFECTS   Increase in pain during the first 24 to 48 hours.     You might experience:   1. Mild to moderate swelling at the joint.   2. Possible bruising at the injection site.     WHEN TO CALL THE DOCTOR/NURSE   Severe pain, worse or different that the pain you had before the procedure.     Fever or chills.     Redness, or swelling around the injection site.     Call the Pain Management  Procedural nurses [phone],  during normal business hours (7 am-3 pm). If it is AFTER HOURS or during a weekend or holiday, call the hospital operator and ask for the Anesthesia Pain physician on call at [phone].   FOR EMERGENCIES, CALL 911 OR GO TO THE NEAREST HOSPITAL EMERGENCY DEPARTMENT.   ?   TO SCHEDULE APPOINTMENTS OR FOR QUESTIONS RELATED TO MEDICATIONS   Call the Pain Management Clinic at [phone] POST SEDATION GUIDELINES/DISCHARGE INSTRUCTIONS    The medicines given to you today for your procedure will stay in your body for some time. You may feel dizzy or lose your sense of balance. The use of your muscles may be changed. Your judgment may be affected. Your reaction time, for example, when driving a car, will be slower. You may not see any of these changes in yourself. In general, you should completely recover from these medicines by tomorrow. For your own safety, we have some strict rules for you to follow for the next 12 hours.      The 7D???s  Do not DRIVE.    Do not use appliances or equipment that could be DANGEROUS, like power tools, stove, burners, lawnmowers, garbage disposals.     Watch out for DIZZINESS. Walk slowly and take your time. Sudden changes of position can also cause nausea.     Do not make any important DECISIONS. You may change your mind tomorrow.     Do not DRINK alcoholic beverages. The drugs in your body may cause your reaction to alcohol to be dangerous.     DIET: If you feel nauseated or ???sick to your stomach???, drink clear liquids like 7-Up, broth, apple juice, ginger ale, tea, and cola or eat jello. If these liquids do not make you ???sick to your stomach???, try eating soft foods like potatoes, rice, pasta and cereal. Tomorrow you can eat regular foods.  DISCUSS any questions you may have with your primary care doctor or the doctor who ordered your test today.

## 2023-04-13 ENCOUNTER — Ambulatory Visit: Admit: 2023-04-13 | Discharge: 2023-04-14 | Payer: MEDICARE | Attending: Anesthesiology | Primary: Anesthesiology

## 2023-04-13 DIAGNOSIS — M7918 Myalgia, other site: Principal | ICD-10-CM

## 2023-04-13 DIAGNOSIS — M47812 Spondylosis without myelopathy or radiculopathy, cervical region: Principal | ICD-10-CM

## 2023-04-13 DIAGNOSIS — G894 Chronic pain syndrome: Principal | ICD-10-CM

## 2023-04-13 MED ADMIN — lidocaine (XYLOCAINE) 20 mg/mL (2 %) injection 10 mL: 10 mL | @ 13:00:00 | Stop: 2023-04-13

## 2023-04-13 MED ADMIN — lidocaine (XYLOCAINE) 5 mg/mL (0.5 %) injection 50 mL: 50 mL | @ 13:00:00 | Stop: 2023-04-13

## 2023-04-13 MED ADMIN — fentaNYL (PF) (SUBLIMAZE) injection 25 mcg: 25 ug | INTRAVENOUS | @ 13:00:00 | Stop: 2023-04-13

## 2023-04-13 MED ADMIN — fentaNYL (PF) (SUBLIMAZE) injection 25 mcg: 25 ug | INTRAVENOUS | @ 12:00:00 | Stop: 2023-04-13

## 2023-04-13 NOTE — Unmapped (Signed)
 Driver present.  Discharge instructions given to patient and pt understood instructions.

## 2023-04-15 ENCOUNTER — Encounter: Admit: 2023-04-15 | Discharge: 2023-04-16 | Payer: MEDICARE | Attending: Psychologist | Primary: Psychologist

## 2023-04-15 DIAGNOSIS — F1190 Opioid use, unspecified, uncomplicated: Principal | ICD-10-CM

## 2023-04-15 DIAGNOSIS — F4321 Adjustment disorder with depressed mood: Principal | ICD-10-CM

## 2023-04-15 DIAGNOSIS — F419 Anxiety disorder, unspecified: Principal | ICD-10-CM

## 2023-04-15 NOTE — Unmapped (Signed)
 Called on check on the patient today AM. She states since procedure she feels soreness/mild stiffness in the procedure area of the neck. She had back HA which is worse when she turns he head. No positional HA. Her neck pain soreness is upto left shoulder area.   She has chronic paresthesia LUE unchanged.   She didn't report any fever, incontinence or motor weakness.    She said when she went work yesterday she had burning/tingling in the left foot and some numbness as well this is since the procedure. This is more than her baseline numbness. No gait issues.     I discussed with the patient that neck pain soreness stiffness is likely an expected outcome from the procedure.     Regarding paresthesia since procedure unsure of clear etiology - no other red flags. Advised to watch her symptoms.     Some HA worse with rotational movement of the neck also likely procedure related. No like PDPH.     At this point I do think think she had obvious concerning emergent pathology. I have advised her to follow her symptoms and reach out to [place] on Monday and report to [place] via my chart how she is doing.     Reviewed with the patient the red flag symptoms & when to report to ED. She understands and agrees.

## 2023-04-15 NOTE — Unmapped (Signed)
 [facility]   Confidential Psychological Therapy Session    Patient Name: Thelma Lorenzetti  Medical Record Number: [id number]  Date of Service: April 15, 2023  Attending Psychologist: Caroline More, PhD  CPT Procedure Code: 81191 for 45 minutes of face to face counseling    This visit was performed face to face with interactive technology using a HIPPA compliant audio/visual platform. We reviewed confidentiality today. The patient was present in [place], a state in which this provider is licensed and able to provide care (location and contact information confirmed), attended this visit alone, and consented to this virtual pain psychology visit.    REFERRING PHYSICIAN: Criss Rosales, MD    CHIEF COMPLAINT AND REASON FOR VISIT:  COM follow up evaluation/pain coping skills; CBT/ACT for pain; grief    SUBJECTIVE / HISTORY OF PRESENT ILLNESS: Ms.  Borunda is a very pleasant 53 y.o.  female with chronic lower back pain, chronic headaches, and myofascial pain. Her pain started in 1999 in her lower back.  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  She had a spinal fusion in 2001 at the L5-S1 level.  She has had bilateral shoulder pain, neck pain, lower back pain, and b/l knees.  She also described a radiculopathy b/l that goes from her back to her bilateral buttocks and down to her toes.      Pt initially established with Dr. Oda Kilts in 08/2016 and then was was first evaluated by me in 07/2017. Last follow up with me was 03/06/2023.  Patient is doing well today, citing improved psychosocial stressors and improved relationships with her significant other as well as 2 sons.  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  She describes how her life feels more full now that she is connected to valued activities and she discusses how she is able to focus away from pain and more towards things she cares about and things she enjoys.  This also seems to have had a positive impact on relationships and related stressors.  She reflects on how thoughts and feelings have changed over time, and how they connect to behaviors.  Overall coping well with pain.  Had recent injection.  Notes it helped.    OBJECTIVE / MENTAL STATUS:    Appearance:   Appears stated age and Clean/Neat   Motor:  No abnormal movements   Speech/Language:   Normal rate, volume, tone, fluency   Mood:  Irritable   Affect:  Blunted   Thought process:  Logical, linear, clear, coherent, goal directed   Thought content:    Denies SI, HI, self harm, delusions, obsessions, paranoid ideation, or ideas of reference   Perceptual disturbances:    Denies auditory and visual hallucinations, behavior not concerning for response to internal stimuli   Orientation:  Oriented to person, place, time, and general circumstances   Attention:  Able to fully attend without fluctuations in consciousness   Concentration:  Able to fully concentrate and attend   Memory:  Immediate, short-term, long-term, and recall grossly intact    Fund of knowledge:   Consistent with level of education and development   Insight:    Fair   Judgment:   Intact   Impulse Control:  Intact  DIAGNOSTIC IMPRESSION:   Anxiety NOS  Chronic continuou use of opiates  Grief    ASSESSMENT:   Ms.  Mccarter is a very pleasant 53 y.o.  female from [place], [place] with chronic lower back pain, chronic headaches, and myofascial pain. Her pain started in 1999 in her lower back.  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  She had a spinal fusion in 2001 at the L5-S1 level.  She has had bilateral shoulder pain, neck pain, lower back pain, and b/l knees.  She also described a radiculopathy b/l that goes from her back to her bilateral buttocks and down to her toes.  The patient is currently considered to be high risk due to prior nonadherence, but appropriate with a behavioral adherence plan in place.  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 She continues to participate actively in psychotherapy.    PLAN:   (1) COM - high risk but remains appropriate with a behavioral adherence plan also in place.    -Patient MUST meet with pain psychology/me once a month.   -No additional overuse of opioids will be tolerated.    -Patient should always bring her medication to clinic for pill counts.  -Patient was informed she has had numerous infractions with respect to her pain contract over the years, and that no further infractions will be tolerated.    If any additional pain contract or behavioral adherence contract infractions occur, I recommend stopping opioids.    Pt doing well with adherence, per our last few visits.     (2) Pain coping skills- addressed compliance, as well as continued skills practice    (3) follow-in 1-2 months

## 2023-04-18 ENCOUNTER — Ambulatory Visit: Admit: 2023-04-18 | Discharge: 2023-04-19 | Payer: MEDICARE

## 2023-04-18 ENCOUNTER — Ambulatory Visit: Admit: 2023-04-18 | Discharge: 2023-04-19 | Payer: MEDICARE | Attending: Sports Medicine | Primary: Sports Medicine

## 2023-04-18 DIAGNOSIS — J019 Acute sinusitis, unspecified: Principal | ICD-10-CM

## 2023-04-18 DIAGNOSIS — B9689 Other specified bacterial agents as the cause of diseases classified elsewhere: Principal | ICD-10-CM

## 2023-04-18 MED ORDER — CLINDAMYCIN HCL 300 MG CAPSULE
ORAL_CAPSULE | Freq: Three times a day (TID) | ORAL | 0 refills | 10 days | Status: CP
Start: 2023-04-18 — End: 2023-04-28

## 2023-04-18 NOTE — Unmapped (Signed)
 Called and spoke with patient today.  She reported - some improvement in HA with rotation and pain going to left shoulder with Head rotation. No positional HA. Neck is still sore.     Still has numbness esp LLE below the knee with tingling in the same upon walking or rubbing her hands.    No motor deficit/weakness per pt. No incontinence. No trouble walking.    No fever, chills sweating.     Dicussed and offered if she would like to come in to be seen tomorrow 04/19/23. However, patient states that she is fine watching her symptoms on her own for now and she will reach out to [place] in a couple days.

## 2023-04-18 NOTE — Unmapped (Signed)
 Assessment/Plan:        Diagnoses and all orders for this visit:    Acute bacterial sinusitis  -     clindamycin (CLEOCIN) 300 MG capsule; Take 1 capsule (300 mg total) by mouth Three (3) times a day for 10 days.            Subjective:      HPI  Natalie [name] is a 53 y.o. old female who presents for evaluation of recent sinus infection.  She has had increased nasal congestion, postnasal drip, and ear pressure and fullness.  She took a course of amoxicillin with temporary improvement but not resolution of her symptoms.    The following portions of the patient's history were reviewed and updated as appropriate:   She  has a past medical history of Allergic conjunctivitis (11/23/2018), Allergic rhinitis, Anemia (1986), Arthritis, Breast mass, Chondromalacia of left knee, Chondromalacia of right knee, Cluster headache, Depressive disorder, Dizziness, Fibrocystic breast, GERD (gastroesophageal reflux disease), Headache, tension-type, Hematuria, History of kidney stones, History of transfusion, Hyperlipidemia (1999), Hypothyroidism, Kidney stone, Lumbar disc disease (1998), Menopause ovarian failure, Menopause, premature, Migraine with aura, Neuromuscular disorder (CMS-HCC) (1999), Obesity (BMI 30-39.9) (11/23/2018), Ovarian cyst, Plantar fasciitis, Pseudotumor cerebri, Rash due to allergy (11/23/2018), Scoliosis, Sickle cell trait (CMS-HCC), Sleep apnea, Urinary tract infection, and Vaginitis.  She  has a past surgical history that includes Back surgery (2001); Tubal ligation (1995); Salpingoophorectomy (Bilateral, 2007); Breast biopsy (Bilateral); Total vaginal hysterectomy (01/04/2006); Spine surgery (2001); Hysterectomy (2008); pr removal of tonsils,12+ y/o (Bilateral, 10/10/2019); Spinal fusion; Epidural block injection; and Trigger point injection.  Her family history includes Allergic rhinitis in her mother; Arthritis in her mother; Asthma in her father; Breast cancer (age of onset: 22) in her cousin; COPD in her maternal aunt, maternal aunt, and mother; Cancer in her sister and sister; Depression in her mother, son, and son; Drug abuse in her mother, son, and son; Heart attack in her father and maternal grandmother; Heart attack (age of onset: 42) in her cousin; Heart disease in her maternal grandmother and mother; Hypertension in her maternal grandmother and mother; Mental illness in her father, mother, son, and son; Migraines in her maternal aunt; Stroke in her paternal grandfather; Thyroid disease in her maternal grandmother and another family member; Ulcers in her mother.  She  reports that she has never smoked. She has been exposed to tobacco smoke. She has never used smokeless tobacco. She reports current alcohol use. She reports that she does not use drugs.  Current Outpatient Medications   Medication Sig Dispense Refill    albuterol HFA 90 mcg/actuation inhaler Inhale 2 puffs every six (6) hours as needed for wheezing or shortness of breath. 54 g 0    azelastine (ASTELIN) 137 mcg (0.1 %) nasal spray 2 sprays into each nostril two (2) times a day. 30 mL 5    azelastine (OPTIVAR) 0.05 % ophthalmic solution Administer 2 drops to both eyes daily as needed. 6 mL 5    baclofen (LIORESAL) 20 MG tablet 10 - 20 mg three times a day as needed for spasms. 90 tablet 2    clindamycin (CLEOCIN) 300 MG capsule Take 1 capsule (300 mg total) by mouth Three (3) times a day for 10 days. 30 capsule 0    diclofenac sodium 20 mg/gram /actuation(2 %) sopm Apply 40 mg topically two (2) times a day. 112 g 0    empty container Misc Use as directed to dispose of Xolair syringes 1 each  2    EPINEPHrine (EPIPEN) 0.3 mg/0.3 mL injection Inject 0.3 mL (0.3 mg total) into the muscle once as needed for anaphylaxis (Difficulty breathing, throat closing, etc) for up to 1 dose. 1 each 12    estradiol (ESTRACE) 1 MG tablet Take 1 tablet (1 mg total) by mouth daily. 90 tablet 3    gabapentin (NEURONTIN) 800 MG tablet Take 1 tablet (800 mg total) by mouth two (2) times a day. 180 tablet 0    hydroxychloroquine (PLAQUENIL) 200 mg tablet Take 1 tablet (200 mg total) by mouth two (2) times a day. 60 tablet 12    ipratropium (ATROVENT) 21 mcg (0.03 %) nasal spray 2 sprays into each nostril four (4) times a day. 30 mL 1    omalizumab (XOLAIR) 150 mg/mL syringe Inject 2 mL (300 mg total) under the skin every fourteen (14) days. 4 mL 11    oxyCODONE-acetaminophen (PERCOCET) 10-325 mg per tablet Take 1 tablet by mouth every four (4) hours as needed for pain. Max 4 tabs/day. Fill on or after: 04/02/23. Brand name only due to allergy. 120 tablet 0    [START ON 05/02/2023] oxyCODONE-acetaminophen (PERCOCET) 10-325 mg per tablet Take 1 tablet by mouth every four (4) hours as needed for pain. Max 4 tabs/day. Fill on or after: 05/02/23. Brand name only due to allergy. 120 tablet 0    [START ON 06/01/2023] oxyCODONE-acetaminophen (PERCOCET) 10-325 mg per tablet Take 1 tablet by mouth every four (4) hours as needed for pain. Max 4 tabs/day. Fill on or after: 06/01/23. Brand name only due to allergy. 120 tablet 0    pantoprazole (PROTONIX) 40 MG tablet TAKE 1 TABLET(40 MG) BY MOUTH DAILY 30 MINUTES BEFORE BREAKFAST AS NEEDED 90 tablet 2    potassium chloride 20 MEQ ER tablet Take 1 tablet (20 mEq total) by mouth daily. 90 tablet 3    progesterone (PROMETRIUM) 100 MG capsule Take 1 capsule (100 mg total) by mouth nightly. 90 capsule 3    rosuvastatin (CRESTOR) 10 MG tablet Take 1 tablet (10 mg total) by mouth nightly. 90 tablet 3    UBRELVY 50 mg tablet TAKE 1 TABLET BY MOUTH FOR HEADACHE. MAY REPEAT IN 2 HOURS IF NEEDED. LIMIT 2 TABLETS PER DAY AND NO MORE THAN 4 TABLETS PER WEEK 16 tablet 3    venlafaxine (EFFEXOR XR) 37.5 MG 24 hr capsule Take 1 capsule (37.5 mg total) by mouth daily for 30 days, THEN 2 capsules (75 mg total) daily. 60 capsule 5    XHANCE 93 mcg/actuation AerB 1 spray into each nostril two (2) times a day as needed. 16 mL 11     Current Facility-Administered Medications   Medication Dose Route Frequency Provider Last Rate Last Admin    omalizumab Geoffry Paradise) injection 300 mg  300 mg Subcutaneous Q28 Days Volertas, Sofija Dalia, MD   300 mg at 03/09/22 1441     She is allergic to buprenorphine hcl, pregabalin, buprenorphine, cephalexin monohydrate, doxycycline, oxycodone hcl, adhesive tape-silicones, doxycycline hyclate (bulk), keflex [cephalexin], opioids - morphine analogues, and oxycodone-acetaminophen..    Review of Systems  ENT ROS as noted in HPI    Objective:      Physical Exam  Constitutional      General appearance: Appears stated age, normocephalic and atraumatic. Vocalization and voice quality normal for age.    Ears, Nose, Mouth, and Throat      External inspection of ears: Normal overall appearance without lesions or inflammation bilaterally.  External inspection of the nose: symmetric and atraumatic.     Otoscopic examination: External auditory canal without inflammation, discharge, lesions,or masses bilaterally. Middle Ear without effusion, mass, or cholesteatoma bilaterally. Tympanic membranes intact without retractions or lesions bilaterally.     Hearing:  Clinical speech reception within normal limits.    Nasal mucosa: Normal pink color without polyps identified bilaterally. Septum: Midline without deviation. Turbinates: Inferior - normal size and color bilaterally.     Oral Cavity:  Lips, teeth, and gums: Upper and Lower lips with normal color, moist and no cracks or lesions. Dentition in good repair. Gingiva: normal color without lesions.   Hard Palate: intact without clefting and no lesions. Soft Palate: Intact, normal mucosal color. Tongue: tongue has normal mobility, color and appearance. Oral mucosa: buccal, labial, lingual, palatal mucosa pink, well hydrated without lesions or masses. Oropharynx: pharyngeal mucosa without inflamation, without lesions or masses. Tonsils: tonsils not enlarged or inflamed bilaterally.Tongue base/lingual tonsils are without inflammation or masses. Floor of mouth: normal course of Wharton's duct, no masses.      Submandibular Glands: normal bilaterally. Parotid Glands: normal size, symmetric, no lesions palpated bilaterally. Sublingual salivary glands: normal.          Neck     Neck: Normal appearance without obvious scarring or asymmetry.   Thyroid: Thyroid not enlarged, no palpable nodules or tenderness.     Lymphatic     Palpation of lymph nodes in head and neck: Left: Normal. Right: Normal.     Neurologic   Cranial nerves: Cranial nerves intact with no focal neurological deficits. Normal ocular motility, primary gaze and normal facial strength.     Psychiatric     Judgment and insight: Normal for age.   Alert and oriented as appropriate for age.      Mood and affect: Normal for age.             I attest to the above information and documentation. However, this note has been created using voice recognition software and may have errors that were not dictated and not seen in editing.

## 2023-04-18 NOTE — Unmapped (Signed)
 [facility] Pharm[facility] Refill Coordination Note    Natalie [name], [place]: August 02, 1970  Phone: [phone] (home)       All above HIPAA information was verified with patient.         04/15/2023    11:45 AM   Specialty Rx Medication Refill Questionnaire   Which Medications would you like refilled and shipped? 0   Please list all current allergies: Dust pollen grass   Have you missed any doses in the last 30 days? No   Have you had any changes to your medication(s) since your last refill? No   How many days remaining of each medication do you have at home? 10   If receiving an injectable medication, next injection date is 04/25/2023   Have you experienced any side effects in the last 30 days? No   Please enter the full address (street address, city, state, zip code) where you would like your medication(s) to be delivered to. [address] , [place] [address]   Please specify on which day you would like your medication(s) to arrive. Note: if you need your medication(s) within 3 days, please call the pharmacy to schedule your order at [phone]  04/19/2023   Has your insurance changed since your last refill? No   Would you like a pharmacist to call you to discuss your medication(s)? No   Do you require a signature for your package? (Note: if we are billing Medicare Part B or your order contains a controlled substance, we will require a signature) No   I have been provided my out of pocket cost for my medication and approve the pharmacy to charge the amount to my credit card on file. Yes         Completed refill call assessment today to schedule patient's medication shipment from the [facility] [phone]).  All relevant notes have been reviewed.       Confirmed patient received a Conservation officer, historic buildings and a Surveyor, mining with first shipment. The patient will receive a drug information handout for each medication shipped and additional FDA Medication Guides as required.         REFERRAL TO PHARMACIST     Referral to the pharmacist: Not needed      [facility]     Shipping address confirmed in Epic.     Delivery Scheduled: Yes, Expected medication delivery date: 04/19/2023.     Medication will be delivered via Same Day Courier to the prescription address in Epic WAM.    Natalie [name]   [facility] Specialty Technician

## 2023-04-18 NOTE — Unmapped (Unsigned)
 PATIENT: Natalie [name]      AGE: 53 y.o.     DOB: 14-Jul-1970         Date of Examination:  04/18/2023      Primary Care Provider:  Royal Hawthorn, MD     Chief Complaint     No chief complaint on file.       History Of Present Illness   Natalie [name] presents for a follow-up of left knee severe medial compartment osteoarthritis and right knee osteoarthritis. She is a patient of Dr. Leone Payor and has been treated with steroid injections and viscosupplementation.  Her last steroid injection to the left knee was in November and she noted improvement with this. She underwent Gelsy injection series last month and feels the injections did not help her left knee significantly but feels her right knee is about 98% improved. She feels the one-injection series was more beneficial for the left knee.  She also had bilateral knee viscosupplementation in March with months of relief.  She would like to consider repeat steroid injection for the left knee today.  She is taking Tylenol for pain.       Today's History Update 04/18/2023 - Patient presents for follow up of right knee. ***       Home Medications     Current Outpatient Medications:     albuterol HFA 90 mcg/actuation inhaler, Inhale 2 puffs every four (4) hours as needed for wheezing., Disp: 54 g, Rfl: 0    albuterol HFA 90 mcg/actuation inhaler, Inhale 2 puffs every six (6) hours as needed for wheezing., Disp: 54 g, Rfl: 0    albuterol HFA 90 mcg/actuation inhaler, Inhale 2 puffs every six (6) hours as needed for wheezing or shortness of breath., Disp: 54 g, Rfl: 0    azelastine (ASTELIN) 137 mcg (0.1 %) nasal spray, 2 sprays into each nostril two (2) times a day., Disp: 30 mL, Rfl: 5    azelastine (OPTIVAR) 0.05 % ophthalmic solution, Administer 2 drops to both eyes daily as needed., Disp: 6 mL, Rfl: 5    baclofen (LIORESAL) 20 MG tablet, 10 - 20 mg three times a day as needed for spasms., Disp: 90 tablet, Rfl: 2    cetirizine (ZYRTEC) 10 MG chewable tablet, Chew 1 tablet (10 mg total) at bedtime as needed for allergies., Disp: , Rfl:     diclofenac sodium 20 mg/gram /actuation(2 %) sopm, Apply 40 mg topically two (2) times a day., Disp: 112 g, Rfl: 0    diclofenac sodium 20 mg/gram /actuation(2 %) sopm, Apply 40 mg topically two (2) times a day., Disp: 112 g, Rfl: 0    empty container Misc, Use as directed to dispose of Xolair syringes, Disp: 1 each, Rfl: 2    EPINEPHrine (EPIPEN) 0.3 mg/0.3 mL injection, Inject 0.3 mL (0.3 mg total) into the muscle once as needed for anaphylaxis (Difficulty breathing, throat closing, etc) for up to 1 dose., Disp: 1 each, Rfl: 12    escitalopram oxalate (LEXAPRO) 10 MG tablet, 1/2 tab PO Daily for 6 days. If well tolerated, increase to 1 whole tablet PO daily., Disp: 30 tablet, Rfl: 1    estradiol (ESTRACE) 1 MG tablet, Take 1 tablet (1 mg total) by mouth daily., Disp: 90 tablet, Rfl: 3    gabapentin (NEURONTIN) 800 MG tablet, Take 1 tablet (800 mg total) by mouth two (2) times a day., Disp: 180 tablet, Rfl: 0    hydroCHLOROthiazide 12.5 MG tablet, TAKE 1 TABLET(12.5  MG) BY MOUTH DAILY AS NEEDED FOR SWELLING, Disp: 90 tablet, Rfl: 3    hydroxychloroquine (PLAQUENIL) 200 mg tablet, Take 1 tablet (200 mg total) by mouth two (2) times a day., Disp: 60 tablet, Rfl: 12    ibuprofen (MOTRIN) 800 MG tablet, Take 1 tablet (800 mg total) by mouth two (2) times a day as needed for pain., Disp: 60 tablet, Rfl: 0    ipratropium (ATROVENT) 21 mcg (0.03 %) nasal spray, 2 sprays into each nostril four (4) times a day., Disp: 30 mL, Rfl: 1    methylPREDNISolone (MEDROL DOSEPACK) 4 mg tablet, follow package directions, Disp: 1 each, Rfl: 0    omalizumab (XOLAIR) 150 mg/mL syringe, Inject 2 mL (300 mg total) under the skin every fourteen (14) days., Disp: 4 mL, Rfl: 11    omalizumab (XOLAIR) 75 mg/0.5 mL syringe, Inject under the skin., Disp: , Rfl:     oxyCODONE-acetaminophen (PERCOCET) 10-325 mg per tablet, Take 1 tablet by mouth every four (4) hours as needed for pain. Max 4 tabs/day. Fill on or after: 04/02/23. Brand name only due to allergy., Disp: 120 tablet, Rfl: 0    [START ON 05/02/2023] oxyCODONE-acetaminophen (PERCOCET) 10-325 mg per tablet, Take 1 tablet by mouth every four (4) hours as needed for pain. Max 4 tabs/day. Fill on or after: 05/02/23. Brand name only due to allergy., Disp: 120 tablet, Rfl: 0    [START ON 06/01/2023] oxyCODONE-acetaminophen (PERCOCET) 10-325 mg per tablet, Take 1 tablet by mouth every four (4) hours as needed for pain. Max 4 tabs/day. Fill on or after: 06/01/23. Brand name only due to allergy., Disp: 120 tablet, Rfl: 0    pantoprazole (PROTONIX) 40 MG tablet, TAKE 1 TABLET(40 MG) BY MOUTH DAILY 30 MINUTES BEFORE BREAKFAST AS NEEDED, Disp: 90 tablet, Rfl: 2    potassium chloride 20 MEQ ER tablet, Take 1 tablet (20 mEq total) by mouth daily., Disp: 90 tablet, Rfl: 3    PROCHAMBER Spcr, , Disp: , Rfl:     progesterone (PROMETRIUM) 100 MG capsule, Take 1 capsule (100 mg total) by mouth nightly., Disp: 90 capsule, Rfl: 3    rosuvastatin (CRESTOR) 10 MG tablet, Take 1 tablet (10 mg total) by mouth nightly., Disp: 90 tablet, Rfl: 3    triamcinolone (KENALOG) 0.1 % cream, Apply topically two (2) times a day as needed WHEN ALLERGIES FLARE-UP AND CAUSES RASH / ITCHING, Disp: 80 g, Rfl: 0    UBRELVY 50 mg tablet, TAKE 1 TABLET BY MOUTH FOR HEADACHE. MAY REPEAT IN 2 HOURS IF NEEDED. LIMIT 2 TABLETS PER DAY AND NO MORE THAN 4 TABLETS PER WEEK, Disp: 16 tablet, Rfl: 3    venlafaxine (EFFEXOR XR) 37.5 MG 24 hr capsule, Take 1 capsule (37.5 mg total) by mouth daily for 30 days, THEN 2 capsules (75 mg total) daily., Disp: 60 capsule, Rfl: 5    XHANCE 93 mcg/actuation AerB, 1 spray into each nostril two (2) times a day as needed., Disp: 16 mL, Rfl: 11    Current Facility-Administered Medications:     omalizumab Geoffry Paradise) injection 300 mg, 300 mg, Subcutaneous, Q28 Days, Volertas, Sofija Dalia, MD, 300 mg at 03/09/22 1441 Allergies   Buprenorphine hcl, Pregabalin, Buprenorphine, Cephalexin monohydrate, Doxycycline, Oxycodone hcl, Adhesive tape-silicones, Doxycycline hyclate (bulk), Keflex [cephalexin], Opioids - morphine analogues, and Oxycodone-acetaminophen    Medical History     Past Medical History:   Diagnosis Date    Allergic conjunctivitis 11/23/2018    Allergic rhinitis  Anemia 1986    Arthritis     Breast mass     Chondromalacia of left knee     Chondromalacia of right knee     Cluster headache     Depressive disorder     Dizziness     Fibrocystic breast     GERD (gastroesophageal reflux disease)     Headache, tension-type     Hematuria     History of kidney stones     History of transfusion     Spinal Fusion    Hyperlipidemia 1999    Hypothyroidism     Kidney stone     Lumbar disc disease 1998    She was injured at work at the age of 63 and then had disk surgery 2 years later     Menopause ovarian failure     Menopause, premature     Migraine with aura     Neuromuscular disorder (CMS-HCC) 1999    Obesity (BMI 30-39.9) 11/23/2018    Ovarian cyst     Plantar fasciitis     Pseudotumor cerebri     Rash due to allergy 11/23/2018    Scoliosis     Sickle cell trait (CMS-HCC)     Sleep apnea     Urinary tract infection     Vaginitis        Surgical History     Past Surgical History:   Procedure Laterality Date    BACK SURGERY  2001    BREAST BIOPSY Bilateral     When patient was 16 & 17 Both Negative    EPIDURAL BLOCK INJECTION      HYSTERECTOMY  2008    PR REMOVAL OF TONSILS,12+ Y/O Bilateral 10/10/2019    Procedure: TONSILLECTOMY;  Surgeon: Lona Millard, MD;  Location: [facility];  Service: ENT    SALPINGOOPHORECTOMY Bilateral 2007    SPINAL FUSION      SPINE SURGERY  2001    TOTAL VAGINAL HYSTERECTOMY  01/04/2006    Uterine Prolapse-Dr. Leeanne Rio OP Note    TRIGGER POINT INJECTION      TUBAL LIGATION  1995       Social History     Social History     Tobacco Use    Smoking status: Never     Passive exposure: Past    Smokeless tobacco: Never   Vaping Use    Vaping status: Never Used   Substance Use Topics    Alcohol use: Yes     Comment: Only drink for really special occasions    Drug use: Never       Family History     Family History   Problem Relation Age of Onset    Hypertension Mother     Heart disease Mother     Arthritis Mother     Depression Mother     Drug abuse Mother     Mental illness Mother     Ulcers Mother     COPD Mother     Allergic rhinitis Mother     Heart attack Father     Mental illness Father     Asthma Father     Cancer Sister         Lymphoma    Heart attack Maternal Grandmother     Heart disease Maternal Grandmother     Hypertension Maternal Grandmother     Thyroid disease Maternal Grandmother     Stroke Paternal Grandfather     Depression Son  Drug abuse Son     Mental illness Son     COPD Maternal Aunt     Heart attack Cousin 58    Breast cancer Cousin 32    Thyroid disease Other     Cancer Sister     COPD Maternal Aunt     Migraines Maternal Aunt     Depression Son     Drug abuse Son     Mental illness Son     Alcohol abuse Neg Hx        Physical Exam     There were no vitals filed for this visit.          Appearance:  Well nourished/well developed.  Psychiatric:  Mood and affect appropriate.    Head:   Normocephalic, atraumatic.   Skin:   Clean, dry, no lesions, no rashes.  Neuro:   Alert and oriented.  Respiratory:  Unlabored.       FOCUSED EXAM:  Bilateral knees: Trace effusion. Tenderness along the medial joint lines greater on the left and tenderness on the left lateral joint line. Minimal tenderness to the patellofemoral joints. Full extension with flexion to 125 degrees. Patellar crepitation with motion. Normal gait.       Imaging   XR Knee 4 Or More Views Left  AP, PA 45, skyline and lateral views of the knee were independently   reviewed. The x-rays demonstrated moderate medial compartment narrowing of   about 50%.  Compared to the prior x-rays taken last year this showed progression of her medial compartment narrowing.  Maintained lateral   compartment and patellofemoral joint space      Assessment & Plan     No diagnosis found.        Plan: Left knee severe medial compartment osteoarthritis, right knee osteoarthritis; Gelsyn injection series 12/2022 with relief on the right knee but recurrent symptoms on the left knee     We discussed the diagnosis and further treatment for her knee.  She will begin icing the knees 20 minutes daily.  She may take tylenol 500-1000mg  twice daily as needed.  She has done well in the past with steroid injections and would like to repeat this in the left knee today.  We reviewed risks and benefits and she desired to proceed.     Procedure: After sterile prep, 5 cc 0.5% Marcaine and 2 cc Kenalog was injected into the left knee.  The patient tolerated the procedure well.     We also discussed repeat viscosupplementation and may consider the one-injection as she had more improvement with this than the 3-injection series - we would try for a repeat series in June.  Follow-up in 8 weeks with Dr. Zenda Alpers.                Reed Pandy Almando Brawley, CMA  Date: 04/18/2023  Time: 8:21 AM    This note was created utilizing Dragon Medical dictation software and may contain transcription errors missed during proofreading.

## 2023-04-19 MED FILL — XOLAIR 150 MG/ML SUBCUTANEOUS SYRINGE: SUBCUTANEOUS | 28 days supply | Qty: 4 | Fill #5

## 2023-04-21 ENCOUNTER — Ambulatory Visit: Admit: 2023-04-21 | Discharge: 2023-04-22 | Payer: MEDICARE | Attending: Sports Medicine | Primary: Sports Medicine

## 2023-04-21 DIAGNOSIS — M25511 Pain in right shoulder: Principal | ICD-10-CM

## 2023-04-21 DIAGNOSIS — G8929 Other chronic pain: Principal | ICD-10-CM

## 2023-04-21 NOTE — Unmapped (Signed)
 PATIENT: Natalie [name]      AGE: 53 y.o.     DOB: Jul 02, 1970         Date of Examination:  04/21/2023      Primary Care Provider:  Royal Hawthorn, MD     Chief Complaint     Chief Complaint   Patient presents with    Left Knee - Follow-up    Right Knee - Follow-up        History Of Present Illness     She has been complain of chronic right shoulder pain.  She was last seen about 4 months ago with similar pain.  She complains of pain with overhead use of the shoulder.  She had undergone a subacromial injection at the time and had good relief of her symptoms.  She states that she has had a prior injection before this as well and has done physical therapy for the shoulder.  The shoulder is once again symptomatic with complaints of pain in the lateral shoulder with overhead use.  No recent cause or exacerbation of her activities.      Home Medications     Current Outpatient Medications:     albuterol HFA 90 mcg/actuation inhaler, Inhale 2 puffs every six (6) hours as needed for wheezing or shortness of breath., Disp: 54 g, Rfl: 0    azelastine (ASTELIN) 137 mcg (0.1 %) nasal spray, 2 sprays into each nostril two (2) times a day., Disp: 30 mL, Rfl: 5    azelastine (OPTIVAR) 0.05 % ophthalmic solution, Administer 2 drops to both eyes daily as needed., Disp: 6 mL, Rfl: 5    baclofen (LIORESAL) 20 MG tablet, 10 - 20 mg three times a day as needed for spasms., Disp: 90 tablet, Rfl: 2    clindamycin (CLEOCIN) 300 MG capsule, Take 1 capsule (300 mg total) by mouth Three (3) times a day for 10 days., Disp: 30 capsule, Rfl: 0    diclofenac sodium 20 mg/gram /actuation(2 %) sopm, Apply 40 mg topically two (2) times a day., Disp: 112 g, Rfl: 0    empty container Misc, Use as directed to dispose of Xolair syringes, Disp: 1 each, Rfl: 2    EPINEPHrine (EPIPEN) 0.3 mg/0.3 mL injection, Inject 0.3 mL (0.3 mg total) into the muscle once as needed for anaphylaxis (Difficulty breathing, throat closing, etc) for up to 1 dose., Disp: 1 each, Rfl: 12    estradiol (ESTRACE) 1 MG tablet, Take 1 tablet (1 mg total) by mouth daily., Disp: 90 tablet, Rfl: 3    gabapentin (NEURONTIN) 800 MG tablet, Take 1 tablet (800 mg total) by mouth two (2) times a day., Disp: 180 tablet, Rfl: 0    hydroxychloroquine (PLAQUENIL) 200 mg tablet, Take 1 tablet (200 mg total) by mouth two (2) times a day., Disp: 60 tablet, Rfl: 12    ipratropium (ATROVENT) 21 mcg (0.03 %) nasal spray, 2 sprays into each nostril four (4) times a day., Disp: 30 mL, Rfl: 1    omalizumab (XOLAIR) 150 mg/mL syringe, Inject 2 mL (300 mg total) under the skin every fourteen (14) days., Disp: 4 mL, Rfl: 11    oxyCODONE-acetaminophen (PERCOCET) 10-325 mg per tablet, Take 1 tablet by mouth every four (4) hours as needed for pain. Max 4 tabs/day. Fill on or after: 04/02/23. Brand name only due to allergy., Disp: 120 tablet, Rfl: 0    [START ON 05/02/2023] oxyCODONE-acetaminophen (PERCOCET) 10-325 mg per tablet, Take 1 tablet by mouth every four (  4) hours as needed for pain. Max 4 tabs/day. Fill on or after: 05/02/23. Brand name only due to allergy., Disp: 120 tablet, Rfl: 0    [START ON 06/01/2023] oxyCODONE-acetaminophen (PERCOCET) 10-325 mg per tablet, Take 1 tablet by mouth every four (4) hours as needed for pain. Max 4 tabs/day. Fill on or after: 06/01/23. Brand name only due to allergy., Disp: 120 tablet, Rfl: 0    pantoprazole (PROTONIX) 40 MG tablet, TAKE 1 TABLET(40 MG) BY MOUTH DAILY 30 MINUTES BEFORE BREAKFAST AS NEEDED, Disp: 90 tablet, Rfl: 2    potassium chloride 20 MEQ ER tablet, Take 1 tablet (20 mEq total) by mouth daily., Disp: 90 tablet, Rfl: 3    progesterone (PROMETRIUM) 100 MG capsule, Take 1 capsule (100 mg total) by mouth nightly., Disp: 90 capsule, Rfl: 3    rosuvastatin (CRESTOR) 10 MG tablet, Take 1 tablet (10 mg total) by mouth nightly., Disp: 90 tablet, Rfl: 3    UBRELVY 50 mg tablet, TAKE 1 TABLET BY MOUTH FOR HEADACHE. MAY REPEAT IN 2 HOURS IF NEEDED. LIMIT 2 TABLETS PER DAY AND NO MORE THAN 4 TABLETS PER WEEK, Disp: 16 tablet, Rfl: 3    venlafaxine (EFFEXOR XR) 37.5 MG 24 hr capsule, Take 1 capsule (37.5 mg total) by mouth daily for 30 days, THEN 2 capsules (75 mg total) daily., Disp: 60 capsule, Rfl: 5    XHANCE 93 mcg/actuation AerB, 1 spray into each nostril two (2) times a day as needed., Disp: 16 mL, Rfl: 11    Current Facility-Administered Medications:     omalizumab Geoffry Paradise) injection 300 mg, 300 mg, Subcutaneous, Q28 Days, Volertas, Sofija Dalia, MD, 300 mg at 03/09/22 1441     Allergies   Buprenorphine hcl, Pregabalin, Buprenorphine, Cephalexin monohydrate, Doxycycline, Oxycodone hcl, Adhesive tape-silicones, Doxycycline hyclate (bulk), Keflex [cephalexin], Opioids - morphine analogues, and Oxycodone-acetaminophen    Medical History     Past Medical History:   Diagnosis Date    Allergic conjunctivitis 11/23/2018    Allergic rhinitis     Anemia 1986    Arthritis     Breast mass     Chondromalacia of left knee     Chondromalacia of right knee     Chronic kidney disease     Cluster headache     Depressive disorder     Dizziness     Fibrocystic breast     GERD (gastroesophageal reflux disease)     Headache, tension-type     Hematuria     History of kidney stones     History of transfusion     Spinal Fusion    Hyperlipidemia 1999    Hypothyroidism     Kidney stone     Lumbar disc disease 1998    She was injured at work at the age of 41 and then had disk surgery 2 years later     Menopause ovarian failure     Menopause, premature     Migraine with aura     Neuromuscular disorder 1999    Obesity (BMI 30-39.9) 11/23/2018    Ovarian cyst     Plantar fasciitis     Pseudotumor cerebri     Rash due to allergy 11/23/2018    Scoliosis     Sickle cell trait (CMS-HCC)     Sleep apnea     Urinary tract infection     Vaginitis        Surgical History     Past Surgical History:   Procedure  Laterality Date    BACK SURGERY  2001    BREAST BIOPSY Bilateral     When patient was 16 & 17 Both Negative    EPIDURAL BLOCK INJECTION      HYSTERECTOMY  2008    PR REMOVAL OF TONSILS,13+ Y/O Bilateral 10/10/2019    Procedure: TONSILLECTOMY;  Surgeon: Lona Millard, MD;  Location: [facility];  Service: ENT    SALPINGOOPHORECTOMY Bilateral 2007    SPINAL FUSION      SPINE SURGERY  2001    TOTAL VAGINAL HYSTERECTOMY  01/04/2006    Uterine Prolapse-Dr. Leeanne Rio OP Note    TRIGGER POINT INJECTION      TUBAL LIGATION  1995       Social History     Social History     Tobacco Use    Smoking status: Never     Passive exposure: Past    Smokeless tobacco: Never   Vaping Use    Vaping status: Never Used   Substance Use Topics    Alcohol use: Yes     Comment: Only drink for really special occasions    Drug use: Never       Family History     Family History   Problem Relation Age of Onset    Hypertension Mother     Heart disease Mother     Arthritis Mother     Depression Mother     Drug abuse Mother     Mental illness Mother     Ulcers Mother     COPD Mother     Allergic rhinitis Mother     Heart attack Father     Mental illness Father     Asthma Father     Cancer Sister         Lymphoma    Heart attack Maternal Grandmother     Heart disease Maternal Grandmother     Hypertension Maternal Grandmother     Thyroid disease Maternal Grandmother     Stroke Paternal Grandfather     Depression Son     Drug abuse Son     Mental illness Son     COPD Maternal Aunt     Heart attack Cousin 32    Breast cancer Cousin 32    Thyroid disease Other     Cancer Sister     COPD Maternal Aunt     Migraines Maternal Aunt     Depression Son     Drug abuse Son     Mental illness Son     Alcohol abuse Neg Hx        Physical Exam          04/21/23 0916   BP: 120/80   Pulse: 70   Weight: 77.1 kg (170 lb)   Height: 154.9 cm (5' 1)      Pain Assessment  Pain Assessment: 0-10  0-10 Pain Scale: 3  Pain Location: Knee  Pain Orientation: Left, Right  Pain Descriptors: Aching  Pain Frequency: Constant/continuous    Appearance:  Well nourished/well developed.  Psychiatric:  Mood and affect appropriate.    Head:   Normocephalic, atraumatic.   Skin:   Clean, dry, no lesions, no rashes.  Neuro:   Alert and oriented.  Respiratory:  Unlabored.       FOCUSED EXAM: Right shoulder revealed full active abduction and flexion but pain through mid range abduction.  5/5 internal and external rotation strength.  Pain with supraspinatus testing.  Tenderness rotator cuff insertion with mild AC joint tenderness.  Pain with impingement signs.    Imaging   XR Knee 4 Or More Views Left  AP, PA 45, skyline and lateral views of the knee were independently   reviewed. The x-rays demonstrated moderate medial compartment narrowing of   about 50%.  Compared to the prior x-rays taken last year this showed   progression of her medial compartment narrowing.  Maintained lateral   compartment and patellofemoral joint space      Assessment & Plan       ICD-10-CM   1. Chronic right shoulder pain  M25.511    G89.29           Plan: Patient continues to have chronic pain in her right shoulder.  She has had prior corticosteroid injections as well as physical therapy but the symptoms persist in spite of this.  Due to her chronic nature of her symptoms, we will schedule for an MRI to evaluate for possible rotator cuff tear.  We will see her back once the MRI is obtained.  She will continue to limit any activities overhead at this point and manage with ibuprofen or Tylenol for the time being.                Lorraine Lax, MD  Date: 04/21/2023  Time: 10:06 AM    This note was created utilizing Dragon Medical dictation software and may contain transcription errors missed during proofreading.

## 2023-04-22 NOTE — Unmapped (Signed)
 Spoke with pt this am.  The excuse from work note has already been done.  Sorry for the confusion.  No further note needed.  FYI pt uses the [id number] - [place], [place] - [address] AT [facility] [address]   For any future Rxes.  Thank you, Luster Landsberg

## 2023-04-24 NOTE — Unmapped (Signed)
 I had called in Prednisone 60 mg daily for 5 days to patient's pharmacy. Prior to calling in patient knew that I was going to call in Rx. However when I tried to reach out to the patient after Rx was sent, she was not reachable. Message sent.

## 2023-04-25 NOTE — Unmapped (Signed)
 Spoke with pt.  Pt currently taking the steroid.  Appt made for 05/02/23 for eval.
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
## 2023-05-02 ENCOUNTER — Ambulatory Visit: Admit: 2023-05-02 | Discharge: 2023-05-03 | Attending: Anesthesiology | Primary: Anesthesiology

## 2023-05-02 DIAGNOSIS — M47812 Spondylosis without myelopathy or radiculopathy, cervical region: Principal | ICD-10-CM

## 2023-05-02 DIAGNOSIS — M170 Bilateral primary osteoarthritis of knee: Principal | ICD-10-CM

## 2023-05-02 DIAGNOSIS — M47816 Spondylosis without myelopathy or radiculopathy, lumbar region: Principal | ICD-10-CM

## 2023-05-02 DIAGNOSIS — E876 Hypokalemia: Principal | ICD-10-CM

## 2023-05-02 MED ORDER — DICLOFENAC 20 MG/GRAM/ACTUATION (2 %) TOPICAL SOLN METERED-DOSE PUMP
0 refills | 0.00 days
Start: 2023-05-02 — End: ?

## 2023-05-02 MED ORDER — OXYCODONE-ACETAMINOPHEN 10 MG-325 MG TABLET
ORAL_TABLET | ORAL | 0 refills | 20.00000 days | Status: CP | PRN
Start: 2023-05-02 — End: ?

## 2023-05-02 MED ORDER — POTASSIUM CHLORIDE ER 20 MEQ TABLET,EXTENDED RELEASE
ORAL_TABLET | Freq: Every day | ORAL | 2 refills | 0.00 days | Status: CP
Start: 2023-05-02 — End: ?

## 2023-05-02 NOTE — Unmapped (Signed)
 Medication: Percocet 10-325   SIG: Take 1 tablet by mouth every 4 hours as needed for pain  Quantity on RX: # 120  Filled on: 05/02/23  Pill count today: # 119

## 2023-05-02 NOTE — Unmapped (Signed)
 Today we did the following   -It was good to see you    -If we have prescribed you a medication, be prepared that your insurance company may require additional paperwork (even for generic medication), typically called a prior authorization.  This sometimes can delay the prescription, but know that our office is very efficient at taking care of these.  If it is significantly delayed, you may call the pharmacy for updates, or call the office.    -Unfortunately many opioid medications have been on shortage lately.  It is helpful to contact your pharmacy up to a week before your fill date to verify if they will have medication (sometimes they know, sometimes they may not).  If we need to send a prescription to a different pharmacy, please let us  know what pharmacy has it in stock and provide us  the name, address and phone number so we can transfer a prescription.  The earlier you are able to do this, decreases the chance that you run out of medication.  It has been challenging, but we will do our best to accommodate, but many times it takes us  48-72 hours to respond to phone calls or mychart messages, we typically are unable to make last minute changes.    Narcan is now available Over the Counter (OTC), meaning you can pick it up at many pharmacies without a prescription.  The cost is around $45.      -I think the neuritis will start to improve soon and that should help the neck pain and sensitivity.  I do think the leg numbness will take a little more time and some adjustments.    -Adjust oxycodone to every 4 hours as needed starting NOW.  I will move your May script to 4/27 so you can refill it sooner.    -Increase gabapentin to 400mg  in the morning and 800mg  at night for a week.  If tolerated increase to 400mg  in the morning, 400mg  midday, and 800mg  at night.  If you want to go up higher, let us  know and we can help direct and send a new prescription.    -Continue to be active    -OK to restart PT when you feel up to it    -Keep your visit 5/7    -Please plan to arrive to all appointments at least 30 minutes prior to your scheduled appointment time. Failure to do so may delay your visit.    *Bring opioid medication with you to each visit    MyChart Messages:  Please use MyChart for non-urgent symptoms or matters such as general questions, non-urgent prescription refills, or non-urgent scheduling issues. For your safety and best  care please DO NOT use MyChart messages to report emergent or urgent symptoms as messages are only checked during regular business hours.  These messages are checked by the nurses during normal business hours 8:30 am-4:30 pm Monday-Friday every 24-48 hours and are for non-urgent, non-emergent concerns. You may be asked to return for a follow up visit if it is deemed your questions are best handled in the clinic setting.  Please call our nurse line below for more time sensitive issues. If our office is closed you can contact the hospital operator to reach the on call provider available for urgent/emergent issues only.     DO:  1. Read the medication guide  2. Take medication exactly as prescribed  3. Store medication in a safe place, away from children and pets, or anyone who might  steal or misuse it. Store it in a safe and secure place (lock box). Also secure any  unfilled prescriptions.   4. Bring unused medication to the next visit so it can be disposed of safely  5. Tell the prescribing provider if you experience any side effects from the medication. You may also report side effects to the FDA at 1-800-FDA-1088.  6. DO check your states rules regarding opioids and driving.    DO NOT:  1. Give your medications to others  2. Take medication unless it was prescribed to you  3. Take more medication than prescribed   4. Stop taking medication without first talking to your provider  5. Break, chew, crush, dissolve, or inject medications. If you cannot swallow your medications, talk to your provider.   6. Drink alcohol or use illegal drugs while taking this medication    BENZODIAZEPINES: Taking opioid pain medications in combination with benzodiazepines can increase the risk of overdose and death.     -Because of the high volume of calls we receive and the high demand for our clinical services, we are occupied all day providing care for patients in the clinic. This leaves little time to respond to phone calls, and we are generally unable to discuss patient care advice over the telephone. If you are experiencing a medication side effect or complication, you can call and let us  know, but we will typically not make a medication substitution or change over the telephone.     We are generally unable to respond acutely to a flare up of pain, as this is quite common in our patients and needs to be dealt with as part of the long term management plan. Please make an appointment with us  if you wish to discuss a matter in any detail. Should you still need to call, please do so at [phone].     Please do not call for early medication refills.     Thank you for choosing [facility]. It was a pleasure to see you in clinic today. Please contact us  with any questions or concerns at [phone].     Hulen Mages, MD    05/02/2023  Anesthesiologist and Pain Management Provider  [facility]      -If you or someone you know is having a mental health emergency, you can dial [phone] (instead of 911), for the mental health emergency line.      What are common side effects from pain medications you may be taking?    Do not drive or do anything with responsibility until you know you are tolerating a new medication.    Many pain type medications can be sedating, especially in combination with others.  They can also decrease your breathing especially in combination with other medications.  If a medication is not effective for you, talk to us  about weaning off (do not stop abruptly on your own), so we do not just continue it.    Only take medications as prescribed.  If a medication is prescribed as needed, it means you can use it as you need it and do not have to take it every day.  Do not ever take more than what is prescribed to you for any medication.  Not only could this be dangerous, but if you do not take it as prescribed we may have to discontinue it due to safety concerns.  If you feel like your medications are not controlling your medications, do not adjust the medication  on your own.  Please discuss at your next clinic visit and we can determine a plan.    Opioids/Narcotics (Examples: oxycodone, hydrocodone, tramadol, morphine, hydromorphone, fentanyl patch)  ?? Constipation-It is important to treat this early and effectively - your doctor can help!  ?? Itching  ?? Nausea  ?? Dizziness or lightheadedness  ?? Drowsiness or sedation  ?? Decreased breathing   ?? Addiction  Sexual difficulties or loss of interest.  Mood changes.  Cognitive impairment (not thinking or responding normally).     Anticonvulsants (Examples: Gabapentin, Pregabalin, Carbamazepine, topiramate)  ?? Dizziness or lightheadedness  ?? Drowsiness or sedation  ?? Nausea  ?? Leg swelling     Antidepressants (Examples: duloxetine,amitriptyline, nortriptyline, venlafaxine)  New warning in 2019 that these medications in combination with opioids can cause respiratory depression.  ?? Dizziness or lightheadedness  ?? Drowsiness or sedation  ?? Constipation  ?? Urinary retention  ?? Dry mouth  ?? Nausea  ?? Difficulty falling asleep     NSAIDS (Examples: Ibuprofen, naproxen, meloxicam, celecoxib)  -The FDA put out guidelines in 2015 that NSAIDs should not be used regularly due to safety concerns.  OK to use a few times a month, or for 1-2 weeks for a flare of pain.  If you take NSAIDs everyday, we should discuss this and stop it due to safety.  ?? Bleeding  ?? Stomach ulcers  ?? Kidney problems  -Heart Attack  -Stroke     Muscle relaxants (Examples: cyclobenzaprine, tizanidine, baclofen)  ?? Dizziness or lightheadedness  ?? Drowsiness or sedation  ?? Nausea  ?? Low blood pressure  ?? Dry mouth     Many pain medications can lead to a rare, potentially dangerous condition called serotonin syndrome. Symptoms can include tremors, agitation, fever, muscle rigidity/contractions, diarrhea, excessive sweating, palpitations and high blood pressure. If these symptoms arise, please contact us  or report to the emergency room for further management.      Your pharmacist can answer any questions you may have in regard to all your medications or possible interactions.    OPIOID(NARCOTIC) MEDICATIONS    You must obtain you narcotic/opioid medications from the [facility] and inform your Pain physician if you receive pain medications (narcotics) from another doctor or emergency room.    It is important that you secure your pain medications to keep them safe from children. You must not share, sell or trade your pain medications with anyone. You must not take anyone else's pain medications. You should not take more pain medications than prescribed without asking your pain doctor first.    You will not receive extra refills for lost or stolen medications, regardless of the circumstances. You may be charged with driving under the influence (DUI) if you drive while on pain medications.    Pain medications can cause sleepiness, dangerously slow breathing and even death. If you or a family member notice a change in your breathing, notify the pain physician immediately.  If you stop breathing or are found unconscious, your family should immediately call 911. Use of pain medicines with alcohol can increase the risk of these complications.    If you stop taking pain medications too quickly, you may have side effects such as sweating, shaking, loss of temper, nausea, diarrhea and increased pain.    A narcotic overdose is the misuse or overuse (doubling up on pills or taking doses too close together) of a narcotic drug. A narcotic overdose can make you pass out and stop breathing.  This is especially true if you drink alcohol or use valium-like medications in addition to the opioid pain medication.  If you stop breathing or are found unconscious, your family should immediately call 911.    You may experience constipation while taking these medications.  You may need to use a stool softener and laxative.    Opiates (narcotics) are medicines used to relieve moderate to severe pain. They may be used for a short time for pain, such as after surgery. Or they may be used for long-term pain. They don't cure a health problem. But they help you manage the pain.  Opiates relieve pain by changing the way your body feels pain and the way you feel about pain. Opiates are powerful medicines. You may need to take extra steps to stay safe. Taking too much (overdose) of an opiate can cause death.    SPECIAL RISK:  Taking your medication in combination with a benzodiazepine medication (nerve pill such as Valium, Xanax, Klonopin, Ativan) can increase the risk to you of overdose and death.      What to know about taking this medicine  Your body gets used to opiates if you take them all the time. You could have withdrawal symptoms when you stop taking them. Symptoms include nausea, sweating, chills, diarrhea, and shaking. But you can avoid these symptoms if you slowly stop taking the medicine as your doctor tells you to.  You have a small chance of addiction if you take opiates as prescribed. Your risk is a bit higher if you have abused drugs in the past.  Some opiates have acetaminophen in them. Check the labels on all the other medicines you take. This includes over-the-counter drugs. Many medicines have acetaminophen. Do not take others with acetaminophen in them unless your doctor has told you to. Taking too much acetaminophen can be harmful. Talk to your doctor or pharmacist if you have questions about this.  Be sure you know how to safely get rid of any leftover medicine. Talk to your doctor or pharmacist about how to do this. Ask for written instructions.    When should you call for help?  Call 911 anytime you think you may need emergency care. For example, call if:  You have trouble breathing.  You have swelling of your face, lips, tongue, or throat.  You have signs of an overdose. These include:  Cold, clammy skin.  Confusion.  Severe nervousness or restlessness.  Severe dizziness, drowsiness, or weakness.  Slow breathing.  Seizures.    Get rid of unused opioid pain medication safely - If you have opioid pills, liquid or patches that you are not going to use, get rid of them right away. Check the [place]  Drug Enforcement Administration (DEA) website (http://www.tucker.net/) for approved locations near you.

## 2023-05-02 NOTE — Unmapped (Signed)
 Patient is requesting the following refill  Requested Prescriptions     Pending Prescriptions Disp Refills    potassium chloride 20 mEq TbER [Pharmacy Med Name: POTASSIUM CHLORIDE ER TABLETS] 30 tablet 5     Sig: TAKE 1 TABLET BY MOUTH DAILY       Recent Visits  Date Type Provider Dept   02/23/23 Office Visit Neelagiri, Forrest Iha, MD [place]   12/15/22 Office Visit Sherell Dill, [place] [facility]   10/07/22 Office Visit Neelagiri, Forrest Iha, MD [place] [facility]   09/14/22 Telemedicine Neelagiri, Forrest Iha, MD [place] [facility]   08/25/22 Office Visit Neelagiri, Forrest Iha, MD [place] [facility]   08/10/22 Office Visit Sherell Dill, [place] [facility]   07/16/22 Telemedicine Neelagiri, Forrest Iha, MD [place] [facility]   07/14/22 Telemedicine [place], Chanda Combes, [place] [facility]   06/08/22 Office Visit Neelagiri, Forrest Iha, MD [place] [facility]   05/07/22 Telemedicine Neelagiri, Forrest Iha, MD [place] [facility]   Showing recent visits within past 365 days and meeting all other requirements  Future Appointments  Date Type Provider Dept   05/18/23 Appointment Neelagiri, Forrest Iha, MD [place] [facility]   Showing future appointments within next 365 days and meeting all other requirements       Labs: Potassium:   Potassium, Serum (mEq/L)   Date Value   12/19/2012 4.1     Potassium (mmol/L)   Date Value   10/07/2022 4.2   01/02/2015 3.9

## 2023-05-03 NOTE — Unmapped (Unsigned)
 REFERRING PROVIDER:  Randolm Butte*    ASSESSMENT:  Natalie [name] is a 53 y.o. female with microscopic hematuria.    The 2025 AUA guidelines for classification and work-up for microscopic hematuria were discussed in detail as well as causes of hematuria.      ***The patient has a formal urinalysis that demonstrates >=3 RBC/HPF, and therefore meets the definition of microscopic hematuria.     ***A formal urinalysis was sent today. If she does not meet the criteria for microscopic hematuria (defined as >=3 RBC/HPF) from the microscopic urinalysis sent today, then formal evaluation for hematuria is unnecessary.      Based on the AUA microhematuria risk stratification system below, this patient is considered to be {DESC;LOW/INTERMEDIATE/HIGH:32201} risk.            **** In low/negligible-risk patients with microhematuria, clinicians should obtain repeat urinalysis within six months rather than perform immediate cystoscopy or imaging.   If microscopic hematuria persists on repeat urinalysis, patient should be reclassified as intermediate- or high-risk based on the repeat UA. At that point, risk-based evaluation will be performed in accordance with recommendations for these respective risk strata.      ***For intermediate risk patients, recommendation includes cystoscopy and renal utrasound.  In appropriately counseled intermediate-risk patients who want to avoid cystoscopy and accept the risk of forgoing direct visual inspection of the bladder urothelium, clinicians may offer urine cytology or validated urine-based tumor markers to facilitate the decision regarding utility of cystoscopy. Renal and bladder ultrasound should still be performed in these cases.  For patients with intermediate-risk microhematuria who do not undergo cystoscopy based on urinary marker results, clinicians should obtain a repeat urinalysis within 12 months. Such patients with persistent microhematuria should then undergo cystoscopy.     ***For high risk patients, recommendation includes cystoscopy with axial upper tract imaging. For patients with no contraindications to its use, CT urogram is recommended. If contraindications exist, MR urography is the next best option. If contraindications exist for both CTU and MRU, then retrograde urogram with non-contrast axial imaging or renal ultrasound is recommended.     We did discuss other contributing factors in the workup of microscopic hematuria, including family or personal history of RCC, VHL, Birt-Hogg-Dube, Tuberus Sclerosis, or lynch syndrome. The presence of any of these genetic syndromes necessitates upper tract imaging regardless of risk category. *** (NEED TO CLARIFY. Does family hx of VHL, BHD, TS qualify someone for upper tract axial imaging?)  We also discussed the use of cytology, which is not recommended for workup of initial microscopic hematuria, but could be considered for intermediate risk patients who wish to avoid cystoscopy, persistent AMH after a negative workup, or irritative voiding symptoms suggestive of possible CIS.     Finally, we discussed follow-up if the workup described above is negative. If workup is negative, we can decide through shared decision making if repeat UA is needed.     If the subsequent urinalysis is negative (and the initial workup was negative), evaluation for microscopic hematuria may be discontinued. For those with persistent microscopic hematuria, shared decision-making will be needed to decide upon additional microscopic hematuria evaluation. Should the patient ever develop gross hematuria or changing clinical picture, following a negative workup for microscopic hematuria, she was counseled to return to our clinic for evaluation.     All questions were answered today.    Lastly, we discussed smoking cessation during today's visit and assessed the patient's readiness to quit. Today, {he_she:25689} {does/does not:53974::does }  express readiness to quit.   I explained that combination nicotine replacement therapy can provide maximal efficacy in smoking cessation by combining long-active (patch) medication to reduce the intensity and frequency of urges and short-acting (gum) to manage urges that arise due to triggers.   I referred the patient to the [place] Quitline: 1-800-QUIT-NOW ([employer]) as well as our internal resource through the [facility] ([email or web address]) to discuss further. The patient {is:54246::is} interested in referral to the TTP.       PLAN:  - {ABS MICRO HEM WORKUP OPTIONS:68889}  - Pt has been referred to the [facility] for smoking cessation     It was a pleasure seeing this {Blank single:19197::gentleman,woman} in my clinic today.    I personally spent *** minutes face-to-face and non-face-to-face in the care of this patient, which includes all pre, intra, and post visit time on the date of service.    REASON FOR VISIT: microscopic hematuria.    HISTORY OF PRESENT ILLNESS:  Natalie [name] is a pleasant 53 y.o. female who is being seen today in consultation at the request of Natalie [name]* for evaluation of microscopic hematuria, and she first noticed this on ***. Since that time, the hematuria {HAS HAS NOT:18834} resolved.    Current or former smoker? ***   If yes, current smoker, see Heaviness of Smoking Index Below.   Heaviness of Smoking Index (HIS)  On the days that you smoke, how soon after you wake up do you have your first cigarette?  {howsoondoyousmoke:117911}    How many cigarettes do you typically smoke per day?   {howmanycigsdoyousmoke:117912}    Scoring  0-2 low addiction  3-4 moderate addiction  5-6: high addiction      History of irritative LUTS: ***   History of gross hematuria: ***  History of exposure to benzene chemicals or aromatic amines (e.g., rubber, petrochemicals, dyes):  ***  Prior cyclophosphamide/ifosfamide chemotherapy: ***  History of renal disease: ***  History of kidney stones: ***  Prior pelvic radiation: ***  Family or personal history of urothelial cancer, lynch syndrome, RCC, VHL, Birt-Hogg-Dube, Tuberus Sclerosis, hereditary leiomyomatosis RCC: ***  History of chronic indwelling catheter or foreign body in urinary tract: ***      All outside records personally reviewed.    PAST MEDICAL HISTORY:   Past Medical History:   Diagnosis Date    Allergic conjunctivitis 11/23/2018    Allergic rhinitis     Anemia 1986    Arthritis     Breast mass     Chondromalacia of left knee     Chondromalacia of right knee     Chronic kidney disease     Cluster headache     Depressive disorder     Dizziness     Fibrocystic breast     GERD (gastroesophageal reflux disease)     Headache, tension-type     Hematuria     History of kidney stones     History of transfusion     Spinal Fusion    Hyperlipidemia 1999    Hypothyroidism     Kidney stone     Lumbar disc disease 1998    She was injured at work at the age of 53 and then had disk surgery 2 years later     Menopause ovarian failure     Menopause, premature     Migraine with aura     Neuromuscular disorder 1999    Obesity (BMI 30-39.9) 11/23/2018  Ovarian cyst     Plantar fasciitis     Pseudotumor cerebri     Rash due to allergy 11/23/2018    Scoliosis     Sickle cell trait (CMS-HCC)     Sleep apnea     Urinary tract infection     Vaginitis        PAST SURGICAL HISTORY:   Past Surgical History:   Procedure Laterality Date    BACK SURGERY  2001    BREAST BIOPSY Bilateral     When patient was 16 & 17 Both Negative    EPIDURAL BLOCK INJECTION      HYSTERECTOMY  2008    PR REMOVAL OF TONSILS,13+ Y/O Bilateral 10/10/2019    Procedure: TONSILLECTOMY;  Surgeon: Trudie Fuse, MD;  Location: OR [place];  Service: ENT    SALPINGOOPHORECTOMY Bilateral 2007    SPINAL FUSION      SPINE SURGERY  2001    TOTAL VAGINAL HYSTERECTOMY  01/04/2006    Uterine Prolapse-Dr. Jenkins [facility] OP Note    TRIGGER POINT INJECTION TUBAL LIGATION  1995       FAMILY HISTORY:   Family History   Problem Relation Age of Onset    Hypertension Mother     Heart disease Mother     Arthritis Mother     Depression Mother     Drug abuse Mother     Mental illness Mother     Ulcers Mother     COPD Mother     Allergic rhinitis Mother     Heart attack Father     Mental illness Father     Asthma Father     Cancer Sister         Lymphoma    Heart attack Maternal Grandmother     Heart disease Maternal Grandmother     Hypertension Maternal Grandmother     Thyroid disease Maternal Grandmother     Stroke Paternal Grandfather     Depression Son     Drug abuse Son     Mental illness Son     COPD Maternal Aunt     Heart attack Cousin 24    Breast cancer Cousin 32    Thyroid disease Other     Cancer Sister     COPD Maternal Aunt     Migraines Maternal Aunt     Depression Son     Drug abuse Son     Mental illness Son     Alcohol abuse Neg Hx        SOCIAL HISTORY:    reports that she has never smoked. She has been exposed to tobacco smoke. She has never used smokeless tobacco. She reports current alcohol use. She reports that she does not use drugs.    MEDICATIONS:   Current Outpatient Medications   Medication Sig Dispense Refill    albuterol HFA 90 mcg/actuation inhaler Inhale 2 puffs every six (6) hours as needed for wheezing or shortness of breath. 54 g 0    azelastine (ASTELIN) 137 mcg (0.1 %) nasal spray 2 sprays into each nostril two (2) times a day. 30 mL 5    azelastine (OPTIVAR) 0.05 % ophthalmic solution Administer 2 drops to both eyes daily as needed. 6 mL 5    baclofen (LIORESAL) 20 MG tablet 10 - 20 mg three times a day as needed for spasms. 90 tablet 2    diclofenac sodium 20 mg/gram /actuation(2 %) sopm Apply 40 mg topically two (2)  times a day. 112 g 0    empty container Misc Use as directed to dispose of Xolair syringes 1 each 2    EPINEPHrine (EPIPEN) 0.3 mg/0.3 mL injection Inject 0.3 mL (0.3 mg total) into the muscle once as needed for anaphylaxis (Difficulty breathing, throat closing, etc) for up to 1 dose. 1 each 12    estradiol (ESTRACE) 1 MG tablet Take 1 tablet (1 mg total) by mouth daily. 90 tablet 3    gabapentin (NEURONTIN) 800 MG tablet Take 1 tablet (800 mg total) by mouth two (2) times a day. 180 tablet 0    hydroxychloroquine (PLAQUENIL) 200 mg tablet Take 1 tablet (200 mg total) by mouth two (2) times a day. 60 tablet 12    ipratropium (ATROVENT) 21 mcg (0.03 %) nasal spray 2 sprays into each nostril four (4) times a day. 30 mL 1    omalizumab (XOLAIR) 150 mg/mL syringe Inject 2 mL (300 mg total) under the skin every fourteen (14) days. 4 mL 11    oxyCODONE-acetaminophen (PERCOCET) 10-325 mg per tablet Take 1 tablet by mouth every four (4) hours as needed for pain. Max 4 tabs/day. Fill on or after: 04/02/23. Brand name only due to allergy. 120 tablet 0    oxyCODONE-acetaminophen (PERCOCET) 10-325 mg per tablet Take 1 tablet by mouth every four (4) hours as needed for pain. Max 4 tabs/day. Fill on or after: 05/02/23. Brand name only due to allergy. 120 tablet 0    [START ON 05/22/2023] oxyCODONE-acetaminophen (PERCOCET) 10-325 mg per tablet Take 1 tablet by mouth every four (4) hours as needed for pain. Max 6 tabs/day. Fill on or after: 05/22/23 (early due to quantity change due to increased symptoms). Brand name only due to allergy. 180 tablet 0    pantoprazole (PROTONIX) 40 MG tablet TAKE 1 TABLET(40 MG) BY MOUTH DAILY 30 MINUTES BEFORE BREAKFAST AS NEEDED 90 tablet 2    potassium chloride 20 MEQ ER tablet Take 1 tablet (20 mEq total) by mouth daily. 90 tablet 3    potassium chloride 20 mEq TbER TAKE 1 TABLET BY MOUTH DAILY 90 tablet 2    progesterone (PROMETRIUM) 100 MG capsule Take 1 capsule (100 mg total) by mouth nightly. 90 capsule 3    rosuvastatin (CRESTOR) 10 MG tablet Take 1 tablet (10 mg total) by mouth nightly. 90 tablet 3    UBRELVY 50 mg tablet TAKE 1 TABLET BY MOUTH FOR HEADACHE. MAY REPEAT IN 2 HOURS IF NEEDED. LIMIT 2 TABLETS PER DAY AND NO MORE THAN 4 TABLETS PER WEEK 16 tablet 3    venlafaxine (EFFEXOR XR) 37.5 MG 24 hr capsule Take 1 capsule (37.5 mg total) by mouth daily for 30 days, THEN 2 capsules (75 mg total) daily. 60 capsule 5    XHANCE 93 mcg/actuation AerB 1 spray into each nostril two (2) times a day as needed. 16 mL 11     Current Facility-Administered Medications   Medication Dose Route Frequency Provider Last Rate Last Admin    omalizumab Anitra Ket) injection 300 mg  300 mg Subcutaneous Q28 Days Volertas, Sofija Dalia, MD   300 mg at 03/09/22 1441       ALLERGIES:    Allergies   Allergen Reactions    Buprenorphine Hcl Itching and Swelling    Pregabalin Swelling    Buprenorphine     Cephalexin Monohydrate     Doxycycline     Oxycodone Hcl     Adhesive Tape-Silicones Itching  Band-aids ok.    Doxycycline Hyclate (Bulk) Nausea And Vomiting     GI Upset    Keflex [Cephalexin] Rash    Opioids - Morphine Analogues Itching    Oxycodone-Acetaminophen Itching and Nausea And Vomiting     GI Upset- GENERIC ONLY- able to take the brand name Percocets       REVIEW OF SYSTEMS: {ABS REVIEW OF SYSTEMS:23552}    PHYSICAL EXAMINATION:   GENERAL: Pleasant female in no acute distress.   VITAL SIGNS: not currently breastfeeding.  PULMONARY: Normal work of breathing, no use of accessory muscles  PSYCHOLOGIC: Normal affect, normal mood    LABS:  Results for orders placed or performed in visit on 12/27/22   Drug Screen, [facility], Urine   Result Value Ref Range    Current Medications Not provided     Creatinine-Adulterants 121.1 mg/dL    Specific Gravity-Adulterants 1.022     Ph-Adultertants 5.7     Oxidants-Adulterants Negative Cutoff: 200 mg/L    Comment, Urine Normal     Urine Barbiturate Screen Negative Cutoff: 200 ng/mL    Cocaine Metabolites, Ur Negative Cutoff: 150 ng/mL    THC, Screen Urine Negative Cutoff: 50 ng/mL    Codeine, Ur Not Detected Cutoff: 25 ng/mL    Codeine-6-beta-glucuronide, Ur Not Detected Cutoff: 100 ng/mL    Morphine, Ur Not Detected Cutoff: 25 ng/mL    Morphine-6-beta-glucuronide, Ur Not Detected Cutoff: 100 ng/mL    6-monoacetylmorphine, Ur Not Detected Cutoff: 25 ng/mL    Hydrocodone, Ur Not Detected Cutoff: 25 ng/mL    Norhydrocodone, Ur Not Detected Cutoff: 25 ng/mL    Dihydrocodeine, Ur Not Detected Cutoff: 25 ng/mL    Hydromorphone, Ur Not Detected Cutoff: 25 ng/mL    Hydromorphone-3-beta-glucuronide, Ur Not Detected Cutoff: 100 ng/mL    Oxycodone, Ur Present (A) Cutoff: 25 ng/mL    Noroxycodone, Ur Present (A) Cutoff: 25 ng/mL    Oxymorphone, Ur Present (A) Cutoff: 25 ng/mL    Oxymorphone-3-beta-glucuronide, Ur Present (A) Cutoff: 100 ng/mL    Noroxymorphone, Ur Present (A) Cutoff: 25 ng/mL    Fentanyl, Ur Not Detected Cutoff: 2 ng/mL    Norfentanyl, Ur Not Detected Cutoff: 2 ng/mL    Meperidine, Ur Not Detected Cutoff: 25 ng/mL    Normeperidine, Ur Not Detected Cutoff: 25 ng/mL    Naloxone, Ur Not Detected Cutoff: 25 ng/mL    Naloxone-3-beta-glucuronide, Ur Not Detected Cutoff: 100 ng/mL    Methadone, Ur Not Detected Cutoff: 25 ng/mL    EDDP, Ur Not Detected Cutoff: 25 ng/mL    Propoxyphene, Ur Not Detected Cutoff: 25 ng/mL    Norpropoxyphene, Ur Not Detected Cutoff: 25 ng/mL    Tramadol, Ur Not Detected Cutoff: 25 ng/mL    O-desmethyltramadol, Ur Not Detected Cutoff: 25 ng/mL    Tapentadol, Ur Not Detected Cutoff: 25 ng/mL    N-desmethyltapentadol, Ur Not Detected Cutoff: 50 ng/mL    Tapentadol-beta-glucuronide, Ur Not Detected Cutoff: 100 ng/mL    Buprenorphine, Ur Not Detected Cutoff: 5 ng/mL    Norbuprenorphine, Ur Not Detected Cutoff: 5 ng/mL    Norbuprenorphine glucuronide, Ur Not Detected Cutoff: 20 ng/mL    Opioid Interpretation, Ur SEE COMMENTS     Alprazolam, Ur Not Detected Cutoff: 10 ng/mL    Alpha-Hydroxyalprazolam, Ur Not Detected Cutoff: 10 ng/mL    Alpha-Hydroxyalprazolam Glucuronide, Ur Not Detected Cutoff: 50 ng/mL    Chlordiazepoxide, Ur Not Detected Cutoff: 10 ng/mL    Clobazam, Ur Not Detected Cutoff: 10 ng/mL  N-Desmethylclobazam, Ur Not Detected Cutoff: 200 ng/mL    Clonazepam, Ur Not Detected Cutoff: 10 ng/mL    7-aminoclonazepam, Ur Not Detected Cutoff: 10 ng/mL    Diazepam, Ur Not Detected Cutoff: 10 ng/mL    Nordiazepam, Ur Not Detected Cutoff: 10 ng/mL    Flunitrazepam, Ur Not Detected Cutoff: 10 ng/mL    7-aminoflunitrazepam, Ur Not Detected Cutoff: 10 ng/mL    Flurazepam, Ur Not Detected Cutoff: 10 ng/mL    2-Hydroxy Ethyl Flurazepam, Ur Not Detected Cutoff: 10 ng/mL    Lorazepam, Ur Not Detected Cutoff: 10 ng/mL    Lorazepam Glucuronide, Ur Not Detected Cutoff: 50 ng/mL    Midazolam, Ur Not Detected Cutoff: 10 ng/mL    Alpha-Hydroxy Midazolam, Ur Not Detected Cutoff: 10 ng/mL    Oxazepam, Ur Not Detected Cutoff: 10 ng/mL    Oxazepam Glucuronide, Ur Not Detected Cutoff: 50 ng/mL    Prazepam, Ur Not Detected Cutoff: 10 ng/mL    Temazepam, Ur Not Detected Cutoff: 10 ng/mL    Temazepam Glucuronide, Ur Not Detected Cutoff: 50 ng/mL    Triazolam, Ur Not Detected Cutoff: 10 ng/mL    Alpha-Hydroxy Triazolam, Ur Not Detected Cutoff: 10 ng/mL    Zolpidem, Ur Not Detected Cutoff: 10 ng/mL    Zolpidem Phenyl-4-Carboxylic acid, Ur Not Detected Cutoff: 10 ng/mL    Benzodiazepine Interpretation SEE COMMENTS     Methamphetamine, Ur Not Detected Cutoff: 100 ng/mL    Amphetamine, Ur Not Detected Cutoff: 100 ng/mL    MDMA, Ur Not Detected Cutoff: 100 ng/mL    MDEA, Ur Not Detected Cutoff: 100 ng/mL    MDA, Ur Not Detected Cutoff: 100 ng/mL    Ephedrine, Ur Not Detected Cutoff: 100 ng/mL    Pseudoephedrine, Ur Not Detected Cutoff: 100 ng/mL    Phentermine, Ur Not Detected Cutoff: 100 ng/mL    Phencyclidine, Ur Not Detected Cutoff: 20 ng/mL    Methylphenidate, Ur Not Detected Cutoff: 20 ng/mL    Ritalinic Acid, Ur Not Detected Cutoff: 100 ng/mL    Stimulant Interpretation SEE COMMENTS      *Note: Due to a large number of results and/or encounters for the requested time period, some results have not been displayed. A complete set of results can be found in Results Review.

## 2023-05-04 ENCOUNTER — Ambulatory Visit: Admit: 2023-05-04 | Payer: Medicare (Managed Care)

## 2023-05-04 NOTE — Unmapped (Unsigned)
 PATIENT: Natalie [name]      AGE: 53 y.o.     DOB: Sep 10, 1970         Date of Examination:  05/04/2023      Primary Care Provider:  Zoraida Hirschfeld, MD     Chief Complaint     No chief complaint on file.       History Of Present Illness     She has been complain of chronic right shoulder pain.  She was last seen about 4 months ago with similar pain.  She complains of pain with overhead use of the shoulder.  She had undergone a subacromial injection at the time and had good relief of her symptoms.  She states that she has had a prior injection before this as well and has done physical therapy for the shoulder.  The shoulder is once again symptomatic with complaints of pain in the lateral shoulder with overhead use.  No recent cause or exacerbation of her activities.    Today's History Update 05/05/2023 - Patient presents for MRI results review of right shoulder. ***       Home Medications     Current Outpatient Medications:     albuterol HFA 90 mcg/actuation inhaler, Inhale 2 puffs every six (6) hours as needed for wheezing or shortness of breath., Disp: 54 g, Rfl: 0    azelastine (ASTELIN) 137 mcg (0.1 %) nasal spray, 2 sprays into each nostril two (2) times a day., Disp: 30 mL, Rfl: 5    azelastine (OPTIVAR) 0.05 % ophthalmic solution, Administer 2 drops to both eyes daily as needed., Disp: 6 mL, Rfl: 5    baclofen (LIORESAL) 20 MG tablet, 10 - 20 mg three times a day as needed for spasms., Disp: 90 tablet, Rfl: 2    diclofenac sodium 20 mg/gram /actuation(2 %) sopm, Apply 40 mg topically two (2) times a day., Disp: 112 g, Rfl: 0    empty container Misc, Use as directed to dispose of Xolair syringes, Disp: 1 each, Rfl: 2    EPINEPHrine (EPIPEN) 0.3 mg/0.3 mL injection, Inject 0.3 mL (0.3 mg total) into the muscle once as needed for anaphylaxis (Difficulty breathing, throat closing, etc) for up to 1 dose., Disp: 1 each, Rfl: 12    estradiol (ESTRACE) 1 MG tablet, Take 1 tablet (1 mg total) by mouth daily., Disp: 90 tablet, Rfl: 3    gabapentin (NEURONTIN) 800 MG tablet, Take 1 tablet (800 mg total) by mouth two (2) times a day., Disp: 180 tablet, Rfl: 0    hydroxychloroquine (PLAQUENIL) 200 mg tablet, Take 1 tablet (200 mg total) by mouth two (2) times a day., Disp: 60 tablet, Rfl: 12    ipratropium (ATROVENT) 21 mcg (0.03 %) nasal spray, 2 sprays into each nostril four (4) times a day., Disp: 30 mL, Rfl: 1    omalizumab (XOLAIR) 150 mg/mL syringe, Inject 2 mL (300 mg total) under the skin every fourteen (14) days., Disp: 4 mL, Rfl: 11    oxyCODONE-acetaminophen (PERCOCET) 10-325 mg per tablet, Take 1 tablet by mouth every four (4) hours as needed for pain. Max 4 tabs/day. Fill on or after: 04/02/23. Brand name only due to allergy., Disp: 120 tablet, Rfl: 0    oxyCODONE-acetaminophen (PERCOCET) 10-325 mg per tablet, Take 1 tablet by mouth every four (4) hours as needed for pain. Max 4 tabs/day. Fill on or after: 05/02/23. Brand name only due to allergy., Disp: 120 tablet, Rfl: 0    [START ON  05/22/2023] oxyCODONE-acetaminophen (PERCOCET) 10-325 mg per tablet, Take 1 tablet by mouth every four (4) hours as needed for pain. Max 6 tabs/day. Fill on or after: 05/22/23 (early due to quantity change due to increased symptoms). Brand name only due to allergy., Disp: 180 tablet, Rfl: 0    pantoprazole (PROTONIX) 40 MG tablet, TAKE 1 TABLET(40 MG) BY MOUTH DAILY 30 MINUTES BEFORE BREAKFAST AS NEEDED, Disp: 90 tablet, Rfl: 2    potassium chloride 20 MEQ ER tablet, Take 1 tablet (20 mEq total) by mouth daily., Disp: 90 tablet, Rfl: 3    potassium chloride 20 mEq TbER, TAKE 1 TABLET BY MOUTH DAILY, Disp: 90 tablet, Rfl: 2    progesterone (PROMETRIUM) 100 MG capsule, Take 1 capsule (100 mg total) by mouth nightly., Disp: 90 capsule, Rfl: 3    rosuvastatin (CRESTOR) 10 MG tablet, Take 1 tablet (10 mg total) by mouth nightly., Disp: 90 tablet, Rfl: 3    UBRELVY 50 mg tablet, TAKE 1 TABLET BY MOUTH FOR HEADACHE. MAY REPEAT IN 2 HOURS IF NEEDED. LIMIT 2 TABLETS PER DAY AND NO MORE THAN 4 TABLETS PER WEEK, Disp: 16 tablet, Rfl: 3    venlafaxine (EFFEXOR XR) 37.5 MG 24 hr capsule, Take 1 capsule (37.5 mg total) by mouth daily for 30 days, THEN 2 capsules (75 mg total) daily., Disp: 60 capsule, Rfl: 5    XHANCE 93 mcg/actuation AerB, 1 spray into each nostril two (2) times a day as needed., Disp: 16 mL, Rfl: 11    Current Facility-Administered Medications:     omalizumab (XOLAIR) injection 300 mg, 300 mg, Subcutaneous, Q28 Days, Volertas, Sofija Dalia, MD, 300 mg at 03/09/22 1441     Allergies   Buprenorphine hcl, Pregabalin, Buprenorphine, Cephalexin monohydrate, Doxycycline, Oxycodone hcl, Adhesive tape-silicones, Doxycycline hyclate (bulk), Keflex [cephalexin], Opioids - morphine analogues, and Oxycodone-acetaminophen    Medical History     Past Medical History:   Diagnosis Date    Allergic conjunctivitis 11/23/2018    Allergic rhinitis     Anemia 1986    Arthritis     Breast mass     Chondromalacia of left knee     Chondromalacia of right knee     Chronic kidney disease     Cluster headache     Depressive disorder     Dizziness     Fibrocystic breast     GERD (gastroesophageal reflux disease)     Headache, tension-type     Hematuria     History of kidney stones     History of transfusion     Spinal Fusion    Hyperlipidemia 1999    Hypothyroidism     Kidney stone     Lumbar disc disease 1998    She was injured at work at the age of 87 and then had disk surgery 2 years later     Menopause ovarian failure     Menopause, premature     Migraine with aura     Neuromuscular disorder 1999    Obesity (BMI 30-39.9) 11/23/2018    Ovarian cyst     Plantar fasciitis     Pseudotumor cerebri     Rash due to allergy 11/23/2018    Scoliosis     Sickle cell trait (CMS-HCC)     Sleep apnea     Urinary tract infection     Vaginitis        Surgical History     Past Surgical History:   Procedure Laterality Date  BACK SURGERY  2001    BREAST BIOPSY Bilateral When patient was 16 & 17 Both Negative    EPIDURAL BLOCK INJECTION      HYSTERECTOMY  2008    PR REMOVAL OF TONSILS,12+ Y/O Bilateral 10/10/2019    Procedure: TONSILLECTOMY;  Surgeon: Trudie Fuse, MD;  Location: OR [facility];  Service: ENT    SALPINGOOPHORECTOMY Bilateral 2007    SPINAL FUSION      SPINE SURGERY  2001    TOTAL VAGINAL HYSTERECTOMY  01/04/2006    Uterine Prolapse-Dr. Jenkins Mo OP Note    TRIGGER POINT INJECTION      TUBAL LIGATION  1995       Social History     Social History     Tobacco Use    Smoking status: Never     Passive exposure: Past    Smokeless tobacco: Never   Vaping Use    Vaping status: Never Used   Substance Use Topics    Alcohol use: Yes     Comment: Only drink for really special occasions    Drug use: Never       Family History     Family History   Problem Relation Age of Onset    Hypertension Mother     Heart disease Mother     Arthritis Mother     Depression Mother     Drug abuse Mother     Mental illness Mother     Ulcers Mother     COPD Mother     Allergic rhinitis Mother     Heart attack Father     Mental illness Father     Asthma Father     Cancer Sister         Lymphoma    Heart attack Maternal Grandmother     Heart disease Maternal Grandmother     Hypertension Maternal Grandmother     Thyroid disease Maternal Grandmother     Stroke Paternal Grandfather     Depression Son     Drug abuse Son     Mental illness Son     COPD Maternal Aunt     Heart attack Cousin 55    Breast cancer Cousin 32    Thyroid disease Other     Cancer Sister     COPD Maternal Aunt     Migraines Maternal Aunt     Depression Son     Drug abuse Son     Mental illness Son     Alcohol abuse Neg Hx        Physical Exam     There were no vitals filed for this visit.          Appearance:  Well nourished/well developed.  Psychiatric:  Mood and affect appropriate.    Head:   Normocephalic, atraumatic.   Skin:   Clean, dry, no lesions, no rashes.  Neuro:   Alert and oriented.  Respiratory:  Unlabored.       FOCUSED EXAM: Right shoulder revealed full active abduction and flexion but pain through mid range abduction.  5/5 internal and external rotation strength.  Pain with supraspinatus testing.  Tenderness rotator cuff insertion with mild AC joint tenderness.  Pain with impingement signs.    Imaging   XR Knee 4 Or More Views Left  AP, PA 45, skyline and lateral views of the knee were independently   reviewed. The x-rays demonstrated moderate medial compartment narrowing of   about 50%.  Compared  to the prior x-rays taken last year this showed   progression of her medial compartment narrowing.  Maintained lateral   compartment and patellofemoral joint space      Assessment & Plan     No diagnosis found.          Plan: ***                Tressia Fry Haruna Rohlfs, CMA  Date: 05/04/2023  Time: 2:51 PM    This note was created utilizing Dragon Medical dictation software and may contain transcription errors missed during proofreading.

## 2023-05-05 ENCOUNTER — Ambulatory Visit
Admit: 2023-05-05 | Discharge: 2023-05-06 | Payer: Medicare (Managed Care) | Attending: Sports Medicine | Primary: Sports Medicine

## 2023-05-05 DIAGNOSIS — M67911 Unspecified disorder of synovium and tendon, right shoulder: Principal | ICD-10-CM

## 2023-05-05 DIAGNOSIS — M75111 Incomplete rotator cuff tear or rupture of right shoulder, not specified as traumatic: Principal | ICD-10-CM

## 2023-05-05 MED ADMIN — triamcinolone acetonide (KENALOG) injection 20 mg: 20 mg | INTRA_ARTICULAR | @ 18:00:00 | Stop: 2023-05-05

## 2023-05-05 MED ADMIN — bupivacaine (PF) (MARCAINE) 0.5 % (5 mg/mL) injection (PF) 5 mL: 5 mL | INTRA_ARTICULAR | @ 18:00:00 | Stop: 2023-05-05

## 2023-05-05 NOTE — Unmapped (Signed)
 Lg Joint Inj: R subacromial bursa on 05/05/2023 2:15 PM  Indications: pain  Details: 22 G needle  Laterality: right  Location: shoulder  Medications: 20 mg triamcinolone acetonide 10 mg/mL; 5 mL bupivacaine (PF) 0.5 % (5 mg/mL)  Procedure, treatment alternatives, risks and benefits explained, specific risks discussed. Consent was given by the patient. Immediately prior to procedure a time out was called to verify the correct patient, procedure, equipment, support staff and site/side marked as required. Patient was prepped and draped in the usual sterile fashion.     Provider Attestation: The information documented by members of my medical care team was reviewed and verified for accuracy by me.          PATIENT: Natalie [name]      AGE: 53 y.o.     DOB: 1970/02/08         Date of Examination:  05/05/2023      Primary Care Provider:  Zoraida Hirschfeld, MD     Chief Complaint     Chief Complaint   Patient presents with    Right Shoulder - Results        History Of Present Illness     She has been complain of chronic right shoulder pain.  She was last seen about 4 months ago with similar pain.  She complains of pain with overhead use of the shoulder.  She had undergone a subacromial injection at the time and had good relief of her symptoms.  She states that she has had a prior injection before this as well and has done physical therapy for the shoulder.  The shoulder is once again symptomatic with complaints of pain in the lateral shoulder with overhead use.  No recent cause or exacerbation of her activities.    Today's History Update 05/05/2023 - Patient presents for MRI results review of right shoulder.  Her symptoms are unchanged      Home Medications     Current Outpatient Medications:     albuterol HFA 90 mcg/actuation inhaler, Inhale 2 puffs every six (6) hours as needed for wheezing or shortness of breath., Disp: 54 g, Rfl: 0    azelastine (ASTELIN) 137 mcg (0.1 %) nasal spray, 2 sprays into each nostril two (2) times a day., Disp: 30 mL, Rfl: 5    azelastine (OPTIVAR) 0.05 % ophthalmic solution, Administer 2 drops to both eyes daily as needed., Disp: 6 mL, Rfl: 5    baclofen (LIORESAL) 20 MG tablet, 10 - 20 mg three times a day as needed for spasms., Disp: 90 tablet, Rfl: 2    diclofenac sodium 20 mg/gram /actuation(2 %) sopm, Apply 40 mg topically two (2) times a day., Disp: 112 g, Rfl: 0    empty container Misc, Use as directed to dispose of Xolair syringes, Disp: 1 each, Rfl: 2    EPINEPHrine (EPIPEN) 0.3 mg/0.3 mL injection, Inject 0.3 mL (0.3 mg total) into the muscle once as needed for anaphylaxis (Difficulty breathing, throat closing, etc) for up to 1 dose., Disp: 1 each, Rfl: 12    estradiol (ESTRACE) 1 MG tablet, Take 1 tablet (1 mg total) by mouth daily., Disp: 90 tablet, Rfl: 3    gabapentin (NEURONTIN) 800 MG tablet, Take 1 tablet (800 mg total) by mouth two (2) times a day., Disp: 180 tablet, Rfl: 0    hydroxychloroquine (PLAQUENIL) 200 mg tablet, Take 1 tablet (200 mg total) by mouth two (2) times a day., Disp: 60 tablet, Rfl: 12  ipratropium (ATROVENT) 21 mcg (0.03 %) nasal spray, 2 sprays into each nostril four (4) times a day., Disp: 30 mL, Rfl: 1    omalizumab (XOLAIR) 150 mg/mL syringe, Inject 2 mL (300 mg total) under the skin every fourteen (14) days., Disp: 4 mL, Rfl: 11    oxyCODONE-acetaminophen (PERCOCET) 10-325 mg per tablet, Take 1 tablet by mouth every four (4) hours as needed for pain. Max 4 tabs/day. Fill on or after: 04/02/23. Brand name only due to allergy., Disp: 120 tablet, Rfl: 0    oxyCODONE-acetaminophen (PERCOCET) 10-325 mg per tablet, Take 1 tablet by mouth every four (4) hours as needed for pain. Max 4 tabs/day. Fill on or after: 05/02/23. Brand name only due to allergy., Disp: 120 tablet, Rfl: 0    [START ON 05/22/2023] oxyCODONE-acetaminophen (PERCOCET) 10-325 mg per tablet, Take 1 tablet by mouth every four (4) hours as needed for pain. Max 6 tabs/day. Fill on or after: 05/22/23 (early due to quantity change due to increased symptoms). Brand name only due to allergy., Disp: 180 tablet, Rfl: 0    pantoprazole (PROTONIX) 40 MG tablet, TAKE 1 TABLET(40 MG) BY MOUTH DAILY 30 MINUTES BEFORE BREAKFAST AS NEEDED, Disp: 90 tablet, Rfl: 2    potassium chloride 20 MEQ ER tablet, Take 1 tablet (20 mEq total) by mouth daily., Disp: 90 tablet, Rfl: 3    potassium chloride 20 mEq TbER, TAKE 1 TABLET BY MOUTH DAILY, Disp: 90 tablet, Rfl: 2    progesterone (PROMETRIUM) 100 MG capsule, Take 1 capsule (100 mg total) by mouth nightly., Disp: 90 capsule, Rfl: 3    rosuvastatin (CRESTOR) 10 MG tablet, Take 1 tablet (10 mg total) by mouth nightly., Disp: 90 tablet, Rfl: 3    UBRELVY 50 mg tablet, TAKE 1 TABLET BY MOUTH FOR HEADACHE. MAY REPEAT IN 2 HOURS IF NEEDED. LIMIT 2 TABLETS PER DAY AND NO MORE THAN 4 TABLETS PER WEEK, Disp: 16 tablet, Rfl: 3    venlafaxine (EFFEXOR XR) 37.5 MG 24 hr capsule, Take 1 capsule (37.5 mg total) by mouth daily for 30 days, THEN 2 capsules (75 mg total) daily., Disp: 60 capsule, Rfl: 5    XHANCE 93 mcg/actuation AerB, 1 spray into each nostril two (2) times a day as needed., Disp: 16 mL, Rfl: 11    Current Facility-Administered Medications:     omalizumab (XOLAIR) injection 300 mg, 300 mg, Subcutaneous, Q28 Days, Volertas, Sofija Dalia, MD, 300 mg at 03/09/22 1441     Allergies   Buprenorphine hcl, Pregabalin, Buprenorphine, Cephalexin monohydrate, Doxycycline, Oxycodone hcl, Adhesive tape-silicones, Doxycycline hyclate (bulk), Keflex [cephalexin], Opioids - morphine analogues, and Oxycodone-acetaminophen    Medical History     Past Medical History:   Diagnosis Date    Allergic conjunctivitis 11/23/2018    Allergic rhinitis     Anemia 1986    Arthritis     Breast mass     Chondromalacia of left knee     Chondromalacia of right knee     Chronic kidney disease     Cluster headache     Depressive disorder     Dizziness     Fibrocystic breast     GERD (gastroesophageal reflux disease)     Headache, tension-type     Hematuria     History of kidney stones     History of transfusion     Spinal Fusion    Hyperlipidemia 1999    Hypothyroidism     Kidney stone  Lumbar disc disease 1998    She was injured at work at the age of 60 and then had disk surgery 2 years later     Menopause ovarian failure     Menopause, premature     Migraine with aura     Neuromuscular disorder 1999    Obesity (BMI 30-39.9) 11/23/2018    Ovarian cyst     Plantar fasciitis     Pseudotumor cerebri     Rash due to allergy 11/23/2018    Scoliosis     Sickle cell trait (CMS-HCC)     Sleep apnea     Urinary tract infection     Vaginitis        Surgical History     Past Surgical History:   Procedure Laterality Date    BACK SURGERY  2001    BREAST BIOPSY Bilateral     When patient was 16 & 17 Both Negative    EPIDURAL BLOCK INJECTION      HYSTERECTOMY  2008    PR REMOVAL OF TONSILS,12+ Y/O Bilateral 10/10/2019    Procedure: TONSILLECTOMY;  Surgeon: Trudie Fuse, MD;  Location: OR [facility];  Service: ENT    SALPINGOOPHORECTOMY Bilateral 2007    SPINAL FUSION      SPINE SURGERY  2001    TOTAL VAGINAL HYSTERECTOMY  01/04/2006    Uterine Prolapse-Dr. Jenkins Mo OP Note    TRIGGER POINT INJECTION      TUBAL LIGATION  1995       Social History     Social History     Tobacco Use    Smoking status: Never     Passive exposure: Past    Smokeless tobacco: Never   Vaping Use    Vaping status: Never Used   Substance Use Topics    Alcohol use: Yes     Comment: Only drink for really special occasions    Drug use: Never       Family History     Family History   Problem Relation Age of Onset    Hypertension Mother     Heart disease Mother     Arthritis Mother     Depression Mother     Drug abuse Mother     Mental illness Mother     Ulcers Mother     COPD Mother     Allergic rhinitis Mother     Heart attack Father     Mental illness Father     Asthma Father     Cancer Sister         Lymphoma    Heart attack Maternal Grandmother Heart disease Maternal Grandmother     Hypertension Maternal Grandmother     Thyroid disease Maternal Grandmother     Stroke Paternal Grandfather     Depression Son     Drug abuse Son     Mental illness Son     COPD Maternal Aunt     Heart attack Cousin 38    Breast cancer Cousin 32    Thyroid disease Other     Cancer Sister     COPD Maternal Aunt     Migraines Maternal Aunt     Depression Son     Drug abuse Son     Mental illness Son     Alcohol abuse Neg Hx        Physical Exam          05/05/23 1430   BP: 152/86  Pulse: 80   Weight: 77.1 kg (170 lb)   Height: 154.9 cm (5' 1)      Pain Assessment  Pain Assessment: No/denies pain    Appearance:  Well nourished/well developed.  Psychiatric:  Mood and affect appropriate.    Head:   Normocephalic, atraumatic.   Skin:   Clean, dry, no lesions, no rashes.  Neuro:   Alert and oriented.  Respiratory:  Unlabored.       FOCUSED EXAM: Right shoulder revealed full active abduction and flexion but pain through mid range abduction.  5/5 internal and external rotation strength.  Pain with supraspinatus testing.  Tenderness rotator cuff insertion with mild AC joint tenderness.  Pain with impingement signs.    Imaging   MRI of the right shoulder was independently reviewed today.  The MRI showed no evidence of a full-thickness rotator cuff tear.  There was some interstitial tearing of the supraspinatus tendon at the insertion.  This was visible on coronal cut on 1 cut with evidence of tendinopathy with with no significant involvement of the tendon anterior and posterior to this.  There was minimal subacromial bursitis.  Mild AC joint arthritis was noted with spur formation.    Assessment & Plan       ICD-10-CM   1. Tendinopathy of rotator cuff, right  M67.911             Plan: We discussed further management of her shoulder pain.  She has had 1 prior corticosteroid injection which gave her significant relief of her symptoms.  We elected to repeat this today but discussed limiting this in the future.  Recommended that she work with her physical therapist on strengthening of her rotator cuff and scapular stabilizers.  Patient was instructed to follow up with primary care provider due to elevated blood pressure.               Markel Silber, MD  Date: 05/05/2023  Time: 4:13 PM    This note was created utilizing Dragon Medical dictation software and may contain transcription errors missed during proofreading.

## 2023-05-12 NOTE — Unmapped (Signed)
 [facility] Delivery Pharm[facility] Refill Coordination Note    Natalie [name], [place]: 1970-06-13  Phone: There are no phone numbers on file.      All above HIPAA information was verified with patient.         05/12/2023    11:50 AM   Specialty Rx Medication Refill Questionnaire   Which Medications would you like refilled and shipped? Xolair    If medication refills are not needed at this time, please indicate the reason below. No   Please list all current allergies: In chart   Have you missed any doses in the last 30 days? No   Have you had any changes to your medication(s) since your last refill? No   How many days remaining of each medication do you have at home? 0   If receiving an injectable medication, next injection date is 05/19/2023   Have you experienced any side effects in the last 30 days? No   Please enter the full address (street address, city, state, zip code) where you would like your medication(s) to be delivered to. [address] [place] [address]   Please specify on which day you would like your medication(s) to arrive. Note: if you need your medication(s) within 3 days, please call the pharmacy to schedule your order at [phone]  05/13/2023   Has your insurance changed since your last refill? No   Would you like a pharmacist to call you to discuss your medication(s)? No   Do you require a signature for your package? (Note: if we are billing Medicare Part B or your order contains a controlled substance, we will require a signature) No   I have been provided my out of pocket cost for my medication and approve the pharmacy to charge the amount to my credit card on file. Yes         Completed refill call assessment today to schedule patient's medication shipment from the [facility] [phone]).  All relevant notes have been reviewed.       Confirmed patient received a Conservation officer, historic buildings and a Surveyor, mining with first shipment. The patient will receive a drug information handout for each medication shipped and additional FDA Medication Guides as required.         REFERRAL TO PHARMACIST     Referral to the pharmacist: Not needed      [facility]     Shipping address confirmed in Epic.     Delivery Scheduled: Yes, Expected medication delivery date: 05/13/23.     Medication will be delivered via Same Day Courier to the prescription address in Epic WAM.    Stephen Ehrlich   [facility] Specialty Technician

## 2023-05-13 MED FILL — XOLAIR 150 MG/ML SUBCUTANEOUS SYRINGE: SUBCUTANEOUS | 28 days supply | Qty: 4 | Fill #6

## 2023-05-16 DIAGNOSIS — M170 Bilateral primary osteoarthritis of knee: Principal | ICD-10-CM

## 2023-05-16 MED ORDER — DICLOFENAC 20 MG/GRAM/ACTUATION (2 %) TOPICAL SOLN METERED-DOSE PUMP
0 refills | 0.00 days
Start: 2023-05-16 — End: ?

## 2023-05-18 ENCOUNTER — Ambulatory Visit: Admit: 2023-05-18 | Discharge: 2023-05-19 | Payer: Medicare (Managed Care)

## 2023-05-18 DIAGNOSIS — Z0000 Encounter for general adult medical examination without abnormal findings: Principal | ICD-10-CM

## 2023-05-18 DIAGNOSIS — N760 Acute vaginitis: Principal | ICD-10-CM

## 2023-05-18 DIAGNOSIS — G478 Other sleep disorders: Principal | ICD-10-CM

## 2023-05-18 DIAGNOSIS — Z0184 Encounter for antibody response examination: Principal | ICD-10-CM

## 2023-05-18 DIAGNOSIS — B9689 Other specified bacterial agents as the cause of diseases classified elsewhere: Principal | ICD-10-CM

## 2023-05-18 DIAGNOSIS — Z1159 Encounter for screening for other viral diseases: Principal | ICD-10-CM

## 2023-05-18 DIAGNOSIS — Z1231 Encounter for screening mammogram for malignant neoplasm of breast: Principal | ICD-10-CM

## 2023-05-18 DIAGNOSIS — F1120 Opioid dependence, uncomplicated: Principal | ICD-10-CM

## 2023-05-18 DIAGNOSIS — R350 Frequency of micturition: Principal | ICD-10-CM

## 2023-05-18 DIAGNOSIS — E0590 Thyrotoxicosis, unspecified without thyrotoxic crisis or storm: Principal | ICD-10-CM

## 2023-05-18 DIAGNOSIS — E7800 Pure hypercholesterolemia, unspecified: Principal | ICD-10-CM

## 2023-05-18 DIAGNOSIS — G4719 Other hypersomnia: Principal | ICD-10-CM

## 2023-05-18 DIAGNOSIS — R8290 Unspecified abnormal findings in urine: Principal | ICD-10-CM

## 2023-05-18 DIAGNOSIS — J019 Acute sinusitis, unspecified: Principal | ICD-10-CM

## 2023-05-18 DIAGNOSIS — R0683 Snoring: Principal | ICD-10-CM

## 2023-05-18 DIAGNOSIS — R7989 Other specified abnormal findings of blood chemistry: Principal | ICD-10-CM

## 2023-05-18 DIAGNOSIS — E559 Vitamin D deficiency, unspecified: Principal | ICD-10-CM

## 2023-05-18 MED ORDER — FLUCONAZOLE 150 MG TABLET
ORAL_TABLET | Freq: Once | ORAL | 0 refills | 1.00 days | Status: CP
Start: 2023-05-18 — End: 2023-05-18

## 2023-05-18 MED ORDER — CLINDAMYCIN HCL 300 MG CAPSULE
ORAL_CAPSULE | Freq: Three times a day (TID) | ORAL | 0 refills | 21.00 days | Status: CP
Start: 2023-05-18 — End: 2023-05-18

## 2023-05-18 MED ORDER — NALOXONE 4 MG/ACTUATION NASAL SPRAY
Freq: Once | NASAL | 2 refills | 0.00 days | Status: CP | PRN
Start: 2023-05-18 — End: ?

## 2023-05-18 MED ORDER — ROSUVASTATIN 10 MG TABLET
ORAL_TABLET | Freq: Every evening | ORAL | 3 refills | 90.00 days | Status: CP
Start: 2023-05-18 — End: ?

## 2023-05-18 NOTE — Unmapped (Signed)
 Assessment/Plan:      Natalie [name] was seen today for annual exam.    Diagnoses and all orders for this visit:    Routine general medical examination at a health care facility  -     CBC w/ Differential; Future  -     Comprehensive Metabolic Panel; Future  -     Lipid Panel; Future  -     Urinalysis with Microscopy; Future  -     TSH; Future  -     Vitamin D 25 Hydroxy (25OH D2 + D3); Future  -     Rubeola antibody IgG; Future  -     Hepatitis B surface antibody; Future  -     Urinalysis with Microscopy    Encounter for screening mammogram for malignant neoplasm of breast  -     Mammo Digital Screening W Tomo Bilateral W CAD; Future    Uncomplicated opioid dependence  -     naloxone (NARCAN) 4 mg nasal spray; 1 spray into alternating nostrils once as needed (opioid overdose.). One spray in either nostril once for known/suspected opioid overdose. May repeat every 2-3 minutes in alternating nostril til EMS arrives    Snoring  -     Polysomnography (Standard); Future    Non-restorative sleep  -     Polysomnography (Standard); Future    Excessive daytime sleepiness  -     Polysomnography (Standard); Future    Vitamin D deficiency  -     Vitamin D 25 Hydroxy (25OH D2 + D3); Future    Low TSH level  -     TSH; Future    Subclinical hyperthyroidism  -     TSH; Future    Pure hypercholesterolemia  -     Lipid Panel; Future  -     rosuvastatin (CRESTOR) 10 MG tablet; Take 1 tablet (10 mg total) by mouth nightly.    Urinary frequency  -     Urine Culture; Future  -     Urine Culture    Abnormal urine odor    Immunity status testing  -     Rubeola antibody IgG; Future  -     Hepatitis B surface antibody; Future    Encounter for screening for other viral diseases  -     Hepatitis B surface antibody; Future    Acute bacterial sinusitis  -     Discontinue: clindamycin (CLEOCIN) 300 MG capsule; Take 1 capsule (300 mg total) by mouth Three (3) times a day for 21 days.  -     clindamycin (CLEOCIN) 300 MG capsule; Take 1 capsule (300 mg total) by mouth Three (3) times a day for 10 days.  -     fluconazole (DIFLUCAN) 150 MG tablet; Take 1 tablet (150 mg total) by mouth once for 1 dose.    Acute vaginitis  -     fluconazole (DIFLUCAN) 150 MG tablet; Take 1 tablet (150 mg total) by mouth once for 1 dose.      Assessment & Plan  Chronic low back pain  Chronic low back pain with disc bulging and foraminal narrowing. Pain management ongoing.  - Continue current pain management regimen.  - Follow up with pain management specialist.    Chronic pain due to shoulder and knee issues  Chronic pain in shoulder and knee. 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  - Continue current pain management regimen.  - Follow up with orthopedic  specialist.    Chronic sinusitis and perennial allergic rhinitis with seasonal variation  Chronic sinusitis with pain behind the eye, left ear pain, and swollen lymph node. History of sinus infections treated with antibiotics. Perennial allergic rhinitis with seasonal variation. She reports not using nasal sprays due to discomfort.  - Prescribe antibiotic for sinusitis.  - Instruct her to use nasal sprays regularly, aiming towards the turbinates and sinus openings.  - Advise warm salt water gargles.    Urinary frequency  Urinary frequency with strong odor to urine. No burning pain reported. Symptoms present for a couple of months.  - Perform urine testing to check for infection or other issues.    Wellness Visit  Routine wellness visit. She is up to date on preventive care including colonoscopy, Pap smear, and vaccinations. Mammogram is due after June 12th.  - Order mammogram after June 12th.  - Ensure she remains up to date with preventive care.  - Continue exercising and eating balanced diet.      Return in about 6 months (around 11/17/2023) for Next scheduled follow up.     Subjective:   Patient ID: Natalie [name] is a 53 y.o. female who  is being seen for her health maintenance exam.    No LMP recorded. Patient has had a hysterectomy.    Chief Complaint   Patient presents with    Annual Exam     History of Present Illness  Natalie [name] is a 53 year old female who presents for an annual physical exam.    She is concerned about a possible sinus infection, experiencing soreness under her chin, a palpable knot in left side of neck when turning her head, and ear pain. Her ears feel numb and congested, with pain behind her eye and a swollen left nostril. She has morning drainage and has experienced these symptoms intermittently for years, with the current episode lasting about three weeks. She uses Xhance nose spray but dislikes the sensation it causes.    She has a history of chronic low back pain, managed with pain medication. She has numbness and a sensation of walking on pebbles. She experiences sharp pains in her knee and side of the legs. She has disc bulging in the lumbar spine with arthritic changes and mild bilateral foraminal narrowing, as well as moderate to severe foraminal narrowing at L4, L5.    She experiences frequent urination, noting that increased water intake leads to a need to urinate shortly after, sometimes requiring stops while driving. This has been occurring for a couple of months. She also mentions a strong odor to her urine, similar to the smell after eating asparagus.    She experiences sleep disturbances, sleeping 6-8 hours per night but often waking up tired. She snores badly, which has worsened recently, and her boyfriend wakes her up due to the noise. She feels tired and sleepy during the day, especially while driving, and sometimes drinks Red Bull to stay awake. She has not had a recent sleep study.    Her diet consists mainly of vegetables, eggs, and cheese, avoiding meat. 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 She aims to walk at least 5,000 steps a day.    She has stopped taking estrogen, progesterone, and Effexor.    She sees pain management for chronic low back pain, Ortho for knee and shoulder pain, allergist, GYN, Neurologist for recurrent headaches, ENT, Ophthalmologist and  Psychiatrsit Dr. Laurence Pons.    She reports that she gets bonus for getting annual visit, and reviewing labs and medical records.    SLEEP: 6-8 hours, depending on her work schedule. Snores, but no witnessed apneic spells. She doesn't always feel refreshed upon getting up from sleep. She has occasional excess daytime sleepiness and tired feeling. Doesn't doze off while driving, but she feels sleepy sometimes.    DIET:  healthy home cooked meals  cheese and eggs. No meat. Lot of vegetables and fruits.  EXERCISE: exercises 5 times a week: walks 5K steps per day, along with Calisthenics and strength training.  CAFFEINE:  a soda can every 2 days or so.  WATER: 64 ounces  VACCINES:   LMP: No LMP recorded. Patient has had a hysterectomy.   PAP: Pap Smear date: Not Found   No results found. However, due to the size of the patient record, not all encounters were searched. Please check Results Review for a complete set of results.   MAMMOGRAM: Mammogram date: 06/22/2022    COLON CANCER SCREENING:Done, UTD  DENTAL: See's dentist regularly  VISION: Denies vision changes    ASCVD risk:  The 10-year ASCVD risk score (Arnett DK, et al., 2019) is: 1.6%    Values used to calculate the score:      Age: 62 years      Sex: Female      Is Non-Hispanic African American: Yes      Diabetic: No      Tobacco smoker: No      Systolic Blood Pressure: 122 mmHg      Is BP treated: No      HDL Cholesterol: 60 mg/dL      Total Cholesterol: 181 mg/dL    Note: For patients with SBP <90 or >200, Total Cholesterol <130 or >320, HDL <20 or >100 which are outside of the allowable range, the calculator will use these upper or lower values to calculate the patient???s risk score.      Dexa: Osteoporosis Never treated  05/22/23 Zoraida Hirschfeld, MD       PHQ-2 Score: 1    PHQ-9 Score: 3    Edinburgh Score:      Screening complete, no depression identified / no further action needed today         ROS:     ROS negative unless noted in HPI    Vital Signs:     Wt Readings from Last 3 Encounters:   05/18/23 78.8 kg (173 lb 11.2 oz)   05/05/23 77.1 kg (170 lb)   05/02/23 77.1 kg (170 lb)     Temp Readings from Last 3 Encounters:   05/18/23 36.6 ??C (97.8 ??F) (Temporal)   04/13/23 36.3 ??C (97.4 ??F) (Temporal)   02/23/23 36.5 ??C (97.7 ??F)     BP Readings from Last 3 Encounters:   05/18/23 122/78   05/05/23 152/86   05/02/23 135/83     Pulse Readings from Last 3 Encounters:   05/18/23 77   05/05/23 80   05/02/23 85     Body mass index is 32.82 kg/m??.    Objective:     Physical Exam  Vitals and nursing note reviewed.   Constitutional:       General: She is not in acute distress.     Appearance: She is well-developed. She is not ill-appearing.   HENT:      Head: Normocephalic.      Comments: Maxillary sinus tenderness.  Right Ear: Tympanic membrane normal.      Left Ear: Tympanic membrane normal.      Ears:      Comments: Effusion in bilateral ears.     Nose: Congestion present.      Mouth/Throat:      Pharynx: No oropharyngeal exudate or posterior oropharyngeal erythema.   Eyes:      General: No scleral icterus.     Conjunctiva/sclera: Conjunctivae normal.   Neck:      Thyroid: No thyromegaly.      Comments: No thyromegaly. No nodules or masses felt.    Cardiovascular:      Rate and Rhythm: Normal rate and regular rhythm.      Heart sounds: Normal heart sounds.   Pulmonary:      Effort: Pulmonary effort is normal. No respiratory distress.      Breath sounds: Normal breath sounds. No stridor. No wheezing.   Abdominal:      Palpations: Abdomen is soft. There is no mass.      Tenderness: There is no abdominal tenderness.   Musculoskeletal:         General: No tenderness. Normal range of motion.      Cervical back: Neck supple. Right lower leg: No edema.      Left lower leg: No edema.   Lymphadenopathy:      Head:      Right side of head: No submandibular adenopathy.      Left side of head: Submandibular adenopathy present.      Cervical: No cervical adenopathy.   Neurological:      Mental Status: She is alert.      Motor: No abnormal muscle tone.   Psychiatric:         Behavior: Behavior normal.         Thought Content: Thought content normal.         Judgment: Judgment normal.          Labs:     Results for orders placed or performed in visit on 05/18/23   Urine Culture    Specimen: Clean Catch; Urine   Result Value Ref Range    Urine Culture, Comprehensive Normal Urogenital Flora    Urinalysis with Microscopy   Result Value Ref Range    Color, UA Yellow     Clarity, UA Clear     Specific Gravity, UA 1.014 1.005 - 1.030    pH, UA 6.0 5.0 - 9.0    Leukocyte Esterase, UA Negative Negative    Nitrite, UA Negative Negative    Protein, UA Negative Negative    Glucose, UA Negative Negative, Trace    Ketones, UA Negative Negative    Urobilinogen, UA <2.0 mg/dL <0.9 mg/dL    Bilirubin, UA Negative Negative    Blood, UA Moderate (A) Negative    RBC, UA 3 0 - 3 /HPF    WBC, UA 1 0 - 2 /HPF    WBC Clumps None Seen None Seen /HPF    Squam Epithel, UA 3 0 - 5 /HPF    Bacteria, UA Occasional (A) None Seen /HPF    Hyphal Yeast None Seen None Seen /HPF    Yeast, UA None Seen None Seen /HPF       Chronic Labs:

## 2023-05-19 LAB — URINALYSIS WITH MICROSCOPY
BILIRUBIN UA: NEGATIVE
GLUCOSE UA: NEGATIVE
HYPHAL YEAST: NONE SEEN /HPF
KETONES UA: NEGATIVE
LEUKOCYTE ESTERASE UA: NEGATIVE
NITRITE UA: NEGATIVE
PH UA: 6 (ref 5.0–9.0)
PROTEIN UA: NEGATIVE
RBC UA: 3 /HPF (ref 0–3)
SPECIFIC GRAVITY UA: 1.014 (ref 1.005–1.030)
SQUAMOUS EPITHELIAL: 3 /HPF (ref 0–5)
UROBILINOGEN UA: <2
WBC CLUMPS: NONE SEEN /HPF
WBC UA: 1 /HPF (ref 0–2)
YEAST: NONE SEEN /HPF

## 2023-05-20 ENCOUNTER — Ambulatory Visit: Admit: 2023-05-20 | Discharge: 2023-05-21 | Payer: Medicare (Managed Care)

## 2023-05-20 DIAGNOSIS — E0590 Thyrotoxicosis, unspecified without thyrotoxic crisis or storm: Principal | ICD-10-CM

## 2023-05-20 DIAGNOSIS — E7800 Pure hypercholesterolemia, unspecified: Principal | ICD-10-CM

## 2023-05-20 DIAGNOSIS — R7989 Other specified abnormal findings of blood chemistry: Principal | ICD-10-CM

## 2023-05-20 DIAGNOSIS — Z0000 Encounter for general adult medical examination without abnormal findings: Principal | ICD-10-CM

## 2023-05-20 DIAGNOSIS — Z0184 Encounter for antibody response examination: Principal | ICD-10-CM

## 2023-05-20 DIAGNOSIS — Z1159 Encounter for screening for other viral diseases: Principal | ICD-10-CM

## 2023-05-20 DIAGNOSIS — E559 Vitamin D deficiency, unspecified: Principal | ICD-10-CM

## 2023-05-20 LAB — CBC W/ AUTO DIFF
BASOPHILS ABSOLUTE COUNT: 0.1 10*9/L (ref 0.0–0.1)
BASOPHILS RELATIVE PERCENT: 1.2 %
EOSINOPHILS ABSOLUTE COUNT: 0.2 10*9/L (ref 0.0–0.5)
EOSINOPHILS RELATIVE PERCENT: 4.5 %
HEMATOCRIT: 41 % (ref 34.0–44.0)
HEMOGLOBIN: 13.4 g/dL (ref 11.3–14.9)
LYMPHOCYTES ABSOLUTE COUNT: 2 10*9/L (ref 1.1–3.6)
LYMPHOCYTES RELATIVE PERCENT: 37.8 %
MEAN CORPUSCULAR HEMOGLOBIN CONC: 32.6 g/dL (ref 32.0–36.0)
MEAN CORPUSCULAR HEMOGLOBIN: 30.8 pg (ref 25.9–32.4)
MEAN CORPUSCULAR VOLUME: 94.3 fL (ref 77.6–95.7)
MEAN PLATELET VOLUME: 10.2 fL (ref 6.8–10.7)
MONOCYTES ABSOLUTE COUNT: 0.5 10*9/L (ref 0.3–0.8)
MONOCYTES RELATIVE PERCENT: 8.8 %
NEUTROPHILS ABSOLUTE COUNT: 2.6 10*9/L (ref 1.8–7.8)
NEUTROPHILS RELATIVE PERCENT: 47.7 %
PLATELET COUNT: 233 10*9/L (ref 150–450)
RED BLOOD CELL COUNT: 4.35 10*12/L (ref 3.95–5.13)
RED CELL DISTRIBUTION WIDTH: 14.1 % (ref 12.2–15.2)
WBC ADJUSTED: 5.4 10*9/L (ref 3.6–11.2)

## 2023-05-20 LAB — COMPREHENSIVE METABOLIC PANEL
ALBUMIN: 3.9 g/dL (ref 3.4–5.0)
ALKALINE PHOSPHATASE: 47 U/L (ref 46–116)
ALT (SGPT): 28 U/L (ref 10–49)
ANION GAP: 8 mmol/L (ref 3–11)
AST (SGOT): 24 U/L (ref <34)
BILIRUBIN TOTAL: 0.6 mg/dL (ref 0.3–1.2)
BLOOD UREA NITROGEN: 11 mg/dL (ref 9–23)
BUN / CREAT RATIO: 17
CALCIUM: 10.1 mg/dL (ref 8.7–10.4)
CHLORIDE: 106 mmol/L (ref 98–107)
CO2: 32 mmol/L — ABNORMAL HIGH (ref 20.0–31.0)
CREATININE: 0.66 mg/dL (ref 0.55–1.02)
EGFR CKD-EPI (2021) FEMALE: >90 mL/min/1.73m2 (ref >=60)
GLUCOSE RANDOM: 85 mg/dL (ref 70–99)
POTASSIUM: 4.8 mmol/L (ref 3.5–5.1)
PROTEIN TOTAL: 7.2 g/dL (ref 5.7–8.2)
SODIUM: 146 mmol/L — ABNORMAL HIGH (ref 136–145)

## 2023-05-20 LAB — LIPID PANEL
CHOLESTEROL: 181 mg/dL (ref <200)
HDL CHOLESTEROL: 60 mg/dL (ref >50)
LDL CHOLESTEROL CALCULATED: 102 mg/dL — ABNORMAL HIGH (ref <100)
NON-HDL CHOLESTEROL: 121 mg/dL (ref <130)
TRIGLYCERIDES: 122 mg/dL (ref <150)

## 2023-05-20 LAB — TSH: THYROID STIMULATING HORMONE: 0.369 u[IU]/mL — ABNORMAL LOW (ref 0.550–4.780)

## 2023-05-20 LAB — HEPATITIS B SURFACE ANTIBODY: HEPATITIS B SURFACE ANTIBODY: NONREACTIVE

## 2023-05-21 DIAGNOSIS — E0590 Thyrotoxicosis, unspecified without thyrotoxic crisis or storm: Principal | ICD-10-CM

## 2023-05-21 DIAGNOSIS — R7989 Other specified abnormal findings of blood chemistry: Principal | ICD-10-CM

## 2023-05-21 LAB — T4, FREE: FREE T4: 1.15 ng/dL (ref 0.89–1.76)

## 2023-05-22 MED ORDER — OXYCODONE-ACETAMINOPHEN 10 MG-325 MG TABLET
ORAL_TABLET | ORAL | 0 refills | 30.00 days | Status: CP | PRN
Start: 2023-05-22 — End: ?

## 2023-05-23 LAB — RUBEOLA ANTIBODY IGG: RUBEOLA IGG ANTIBODY: NEGATIVE — AB

## 2023-05-23 LAB — VITAMIN D 25 HYDROXY: VITAMIN D, TOTAL (25OH): 37.5 ng/mL (ref 20.0–80.0)

## 2023-05-26 DIAGNOSIS — R350 Frequency of micturition: Principal | ICD-10-CM

## 2023-05-26 DIAGNOSIS — R3129 Other microscopic hematuria: Principal | ICD-10-CM

## 2023-05-27 DIAGNOSIS — Z23 Encounter for immunization: Principal | ICD-10-CM

## 2023-05-27 DIAGNOSIS — R3129 Other microscopic hematuria: Principal | ICD-10-CM

## 2023-05-31 NOTE — Unmapped (Signed)
 Department of Anesthesiology  [facility]  [address]  [place], [place] [address]  [phone]    Chronic Pain Follow Up Note  1. Lumbosacral radiculopathy    2. 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 Spondylosis without myelopathy or radiculopathy, cervical region    5. Primary osteoarthritis of both knees      Assessment and Plan  Attending: Thereasa Flaming is a 53 y.o. female who is being followed at the [facility] with a history of chronic pain localized to bilateral shoulder, knee as well as cervical thoracic and lumbar pain with history of L5-S1 spinal fusion and the pain is associated with bilateral radiculopathy. She was first seen in clinic in August 2018. She has a history of migraines and pseudotumor cerebri, followed by neurology and ophthalmology for treatment. Gets caudal ESI and TPIs to good effect. Compliance concerns in 2021 that led to additional oversight and then transitioning to Belbuca in 10/2019. However, she did not tolerate belbuca or nucynta and was transitioned back to Percocet but with closer oversight and assistance from pain psychology. Between medications and procedures, she has been able to continue working. She now teaches water aerobics.      May 2025  At last visit in May, the patient returned for early follow up following her procedure 04/13/23. 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 Reported persistent numbness and neuropathic pain from hairline to bottom, with significant numbness and tingling in left leg and foot. Symptoms had not improved since procedure approximately two and a half weeks ago. Neuritis likely due to inflammation of nerves as they died off, which can take up to a month to resolve. Informed that symptoms should gradually improve over time. Discussed that nerve damage and neuropathy were rare but known risks of procedure, and neuritis should settle down with time.  Her neck pain and sensitivity were likely related to the neuritis.  Her lower extremity numbness was likely related to her dural puncture.  Her strength was 5/5, but the increase in numbness is affecting her. It was still early following her procedure and it likely will take more time. She had significant issues with neuropathic pain in the past, although this had improved with our treatment plan over the years.  At this point doubt imaging would provide additional changes to treatment plan but will continue close oversight. Chronic pain involving neck, shoulder, knee, and spine, managed with medications including baclofen, gabapentin, Voltaren gel, nortriptyline, and Percocet. Underwent procedures such as caudal epidural steroid injections, trigger point injections, and cervical radiofrequency ablations. Exacerbation of symptoms likely related to recent procedure, but underlying chronic pain syndrome remains significant issue. Discussed that chronic nature of pain required ongoing management and adjustments to treatment plan.      Today, patient presents reporting unchanged continued post procedural neuritis, numbness and neuropathic pain to the level of bottom of her left foot. She denies new symptoms. She states she did not notice any benefit with steroid pack. She notes some effect with gabapentin 400/400/800 mg, unable to tolerate increased dose in the past. Significant benefit with increased percocet, we agree to continue #180 percocet for May refill. She also notes benefit from OTC lidocaine, we will add lidocaine 5% patches as well. She also notes pain worsens with prolonged standing and requests work letter to have frequent breaks, which we provided  today. Encouraged pt to reach us  if symptoms progress. We will refill percocet for this month without changes and return to previous dosage #120 for June refill, she agrees with this.  We will follow up in two months to reassess symptoms and medication management.     Of note, pill count about two days short today, she states she has 2-3 days supply in her pill organizer at home. Currently she takes percocet 5-6 daily.     Post-procedural neuritis   - Continue gabapentin dosage by adding 400 mg dose in morning and midday, while maintaining 800 mg dose at night.    - Continue oxycodone prescription to allow dosing every four hours as needed for pain management given the increase in pain and to allow her to continue to be active, working, and taking care of her nephew.    - Encourage use of heat, ice, and stretching exercises to alleviate symptoms.    - Continue lidocaine patches, Voltaren gel    Lumbar fusion    Lumbar fusion relevant to chronic pain management. No indication of acute issues related to lumbar fusion at this time.    Cervicalgia  Worsened; s/p cervical RFA with likely neuritis and some effects related to dural puncture.      Myofascial pain syndrome  She states she benefited after the most recent cervical TPIs, currently getting about 60% relief, FMAS score improved  - PT on hold, but can restart at any point  - Continue Baclofen 20 mg TID, max dose  - Repeat cervical TPIs PRN after 04/2023; FMAS 10 at last visit, 16 day of procedure    Lumbosacral radiculopathy    Having sustained relief following caudal ESI in August 2023, though worsened recently. Would like to prioritize neck pain currently, though wants to repeat caudal ESI in the future.  - Continue Gabapentin  - Continue Voltaren gel  - Continue Nortriptyline 50 mg at bedtime per Neurology     Chronic pain syndrome   - Continue Percocet 10-325 mg to q4 PRN x1 month then return to q 6 hours #120   - UDS up to date  - Patient has been exercising with benefit.  - Continue current pain management regimen including baclofen, Voltaren gel, nortriptyline, and Percocet as needed.    - Monitor and adjust medications as necessary based on symptom control and side effects.       Last pain medication agreement on file and signed on 12/27/22  Last urine toxicology: 12/27/22.  Appropriate.  [place]CCSRS database was reviewed today and is appropriate:    COMM: 3 points on 06/01/23  Last Opioid Change: Percocet 10-325 decreased from 5x daily PRN to 4x daily PRN 05/2016, 01/2018-increase to #132, several changes in Fall 2021 due to compliance concerns, 11/2019-back to percocet #90, 05/2020-back to #120, 04/2023-Up to #180  Previous Compliance Issues: Did not bring medications 10/2016, no show 04/2017, overtaking 06/2017, short on 09/27/17, lost Rx 10/2017, 01/2018-too late to be seen, 04/2019- 4 days short, but reports some tabs in med box at home. 07/2019 - short, states that she left 28 pills at home and 5 in her car. 08/2019- short, says she overtook during acute increase in pain in setting of acute sinusitis/strep throat. 09/2019- short, says she is unsure why her pill count is low and denies overuse. 10/2019 Did not bring Belbuca to appointment, 11/28/2019 - Did not bring Percocet for pill count, 06/2021 - Did not bring Percocet  Oral Morphine Equivalents: 60 (90 temporarily)  On a Benzodiazepine: no   Naloxone last Ordered:  12/31/2020   NCCSRS database was reviewed 06/01/23.      Return in about 2 months (around 08/01/2023).    Future Considerations:  Repeat caudal ESI  Ensure continued f/u with Dr. Erica Hau      Requested Prescriptions     Signed Prescriptions Disp Refills    baclofen (LIORESAL) 20 MG tablet 90 tablet 2     Sig: 10 - 20 mg three times a day as needed for spasms.    oxyCODONE-acetaminophen (PERCOCET) 10-325 mg per tablet 180 tablet 0     Sig: Take 1 tablet by mouth every four (4) hours as needed for pain. Max 6 tabs/day. Fill on or after: 06/21/23. Brand name only due to allergy.    lidocaine (LIDODERM) 5 % patch 30 patch 2     Sig: Place 1 patch on the skin daily. Apply up to 1 patches at a time.  Use for up to 12 hours/day.    oxyCODONE-acetaminophen (PERCOCET) 10-325 mg per tablet 120 tablet 0     Sig: Take 1 tablet by mouth every four (4) hours as needed for pain. Max 4 tabs/day. Fill on or after: 07/21/23. Brand name only due to allergy.    gabapentin (NEURONTIN) 800 MG tablet 180 tablet 0     Sig: Take 1 tablet (800 mg total) by mouth two (2) times a day.     No orders of the defined types were placed in this encounter.      HPI  Nyashia Raney Lovan is a 53 y.o. being followed at [facility] for complaint of chronic pain localized to her neck, upper back, lower back, bilateral hips and knees.      Patient was last seen in May. Since last visit, the patient has followed with Ortho and Fam Med.     Today, patient presents reporting unchanged continued post procedural neuritis, numbness and neuropathic pain to the level of bottom of her left foot. She denies new or red flag symptoms. She states she did not notice any benefit with steroid pack. She notes some effect with gabapentin 400/400/800 mg, unable to tolerate increased dose in the past. Significant benefit with increased percocet due to increased pain. She also notes benefit from OTC lidocaine, we will add lidocaine 5% patches as well. She endorses benefit on current medication regimen without any associated adverse side effects.     Patients current medication regimen:  - Percocet 10-325 mg q4-6h prn   - Gabapentin 400/400/800 mg  - Voltaren gel-using regularly  - Baclofen 20mg  TID  - Nortriptyline 50 mg at bedtime (per neurology)     Current view: Showing all answers              Previous Responses        Op Gct Opt Outs       Question 05/18/2023  1:03 PM EDT - Filed by Patient    Would you like to be excluded from the patient list? Yes    Would you like for us  to withhold information about you from your family and friends? Yes    Would you like for us  to withhold information about you from community clergy? Yes          Patient denies homicidal/suicidal ideation.     Allergies  Allergies   Allergen Reactions    Buprenorphine Hcl Itching and Swelling    Pregabalin Swelling    Buprenorphine  Cephalexin Monohydrate     Doxycycline     Oxycodone Hcl     Adhesive Tape-Silicones Itching     Band-aids ok.    Doxycycline Hyclate (Bulk) Nausea And Vomiting     GI Upset    Keflex [Cephalexin] Rash    Opioids - Morphine Analogues Itching    Oxycodone-Acetaminophen Itching and Nausea And Vomiting     GI Upset- GENERIC ONLY- able to take the brand name Percocets     Home Medications    Current Outpatient Medications   Medication Sig Dispense Refill    albuterol HFA 90 mcg/actuation inhaler Inhale 2 puffs every six (6) hours as needed for wheezing or shortness of breath. 54 g 0    azelastine (ASTELIN) 137 mcg (0.1 %) nasal spray 2 sprays into each nostril two (2) times a day. 30 mL 5    azelastine (OPTIVAR) 0.05 % ophthalmic solution Administer 2 drops to both eyes daily as needed. 6 mL 5    baclofen (LIORESAL) 20 MG tablet 10 - 20 mg three times a day as needed for spasms. 90 tablet 2    diclofenac sodium 20 mg/gram /actuation(2 %) sopm Apply 40 mg topically two (2) times a day. 112 g 0    empty container Misc Use as directed to dispose of Xolair syringes 1 each 2    EPINEPHrine (EPIPEN) 0.3 mg/0.3 mL injection Inject 0.3 mL (0.3 mg total) into the muscle once as needed for anaphylaxis (Difficulty breathing, throat closing, etc) for up to 1 dose. 1 each 12    gabapentin (NEURONTIN) 800 MG tablet Take 1 tablet (800 mg total) by mouth two (2) times a day. 180 tablet 0    hydroxychloroquine (PLAQUENIL) 200 mg tablet Take 1 tablet (200 mg total) by mouth two (2) times a day. 60 tablet 12    ipratropium (ATROVENT) 21 mcg (0.03 %) nasal spray 2 sprays into each nostril four (4) times a day. 30 mL 1    naloxone (NARCAN) 4 mg nasal spray 1 spray into alternating nostrils once as needed (opioid overdose.). One spray in either nostril once for known/suspected opioid overdose. May repeat every 2-3 minutes in alternating nostril til EMS arrives 2 each 2    omalizumab (XOLAIR) 150 mg/mL syringe Inject 2 mL (300 mg total) under the skin every fourteen (14) days. 4 mL 11    oxyCODONE-acetaminophen (PERCOCET) 10-325 mg per tablet Take 1 tablet by mouth every four (4) hours as needed for pain. Max 4 tabs/day. Fill on or after: 04/02/23. Brand name only due to allergy. 120 tablet 0    oxyCODONE-acetaminophen (PERCOCET) 10-325 mg per tablet Take 1 tablet by mouth every four (4) hours as needed for pain. Max 4 tabs/day. Fill on or after: 05/02/23. Brand name only due to allergy. 120 tablet 0    oxyCODONE-acetaminophen (PERCOCET) 10-325 mg per tablet Take 1 tablet by mouth every four (4) hours as needed for pain. Max 6 tabs/day. Fill on or after: 05/22/23 (early due to quantity change due to increased symptoms). Brand name only due to allergy. 180 tablet 0    pantoprazole (PROTONIX) 40 MG tablet TAKE 1 TABLET(40 MG) BY MOUTH DAILY 30 MINUTES BEFORE BREAKFAST AS NEEDED 90 tablet 2    potassium chloride 20 MEQ ER tablet Take 1 tablet (20 mEq total) by mouth daily. 90 tablet 3    potassium chloride 20 mEq TbER TAKE 1 TABLET BY MOUTH DAILY 90 tablet 2    rosuvastatin (CRESTOR) 10 MG  tablet Take 1 tablet (10 mg total) by mouth nightly. 90 tablet 3    UBRELVY 50 mg tablet TAKE 1 TABLET BY MOUTH FOR HEADACHE. MAY REPEAT IN 2 HOURS IF NEEDED. LIMIT 2 TABLETS PER DAY AND NO MORE THAN 4 TABLETS PER WEEK 16 tablet 3    XHANCE 93 mcg/actuation AerB 1 spray into each nostril two (2) times a day as needed. 16 mL 11     No current facility-administered medications for this visit.     Previous Medication Trials:  NSAIDS- celebrex, ibuprofen, naproxen,   Antidepressants-   Anticonvulsant- gabapentin, pregabalin,   Muscle relaxants- baclofen, flexeril, valium, skelaxin, robaxin, tizanidine  Topicals-voltaren gel, lidoderm,   Short-acting opiates-hydromorphone, percocet, oxycodone, tramadol, ultracet, vicodin, nucynta IR  Long-acting opiates- fentanyl, morphine, methadone, oxycontin. Sedation with long acting opioids. Belbuca (side effects)  Anxiolytics-   Other-     Previous Interventions  PT/Aquatic therapy/TENS/Meidcation/Epidural injections/Nerve Block/ Other injections/Surgery/Psychological Counseling  Before our clinic:  - L3-5 MBBB 11/2015 with good benefit; repeat did not help per patient  - B/l trochanteric injections 10/2014  - TPI 11/2015 upper neck to lower back  - B/l knee injections 2017  - Caudal epidural steroid injection (unsure of timing) with improvement in radiculopathy symptoms  Previous tests include MRI, XRAYs, CT scan and NCS/EMG  [facility]  Caudal ESI 10/20/16, 01/2017, 07/14/17, 10/27/17, 03/22/18, 11/16/18, 05/25/19,11/26/19  Knee injection-[place] 10/22/16  TPI 10/28/16, 01/2017, 07/14/17, 08/11/17, 10/27/17, 03/22/18, 12/17/19  Lumbar L2, L3, L4 MBB - >80% relief x2 in October 2022  Lumbar L2, L3, L4 RFA - 12/10/20 - initial flare-likely neuritis. Can assess efficacy longer term.  PT-07/2018  Bilateral knee IA CSI-Sports med 02/16/19  Trigger point injections 03/05/20 and 04/21/20, 05/30/20, 07/09/20, 08/21/20, 09/25/20, 11/05/20  Caudal ESI 03/19/20, 08/11/20, 09/01/21  TENS unit-ordered 05/2020  12/24/21-bilateral GTB CST ortho  11/26/21-ortho knee CSI  11/19/21-ortho knee CSI  Cervical RFA 05/11/22  TPI 08/31/22  PT referral 08/2022  Cervical RFA 04/13/23-dural puncture, neuritis    Imaging:  Cervical XRAY 11/20/21  Impression   Mild multilevel degenerative disc disease, greatest at C4-C5 and C5-6 and unchanged from the prior CT on 08/08/2020. Anterolisthesis of C4 on C5 is also unchanged.     Review Of Systems  See questionnaires    Physical Exam    VITALS:   Vitals:    06/01/23 0829   BP: 140/89   Pulse: 69   Resp: 16   SpO2: 99%     Wt Readings from Last 3 Encounters:   06/01/23 77.1 kg (170 lb)   05/18/23 78.8 kg (173 lb 11.2 oz)   05/05/23 77.1 kg (170 lb)       GENERAL:  The patient is well developed, well-nourished, and appears to be in no apparent distress.   HEAD/NECK:    Normocephalic/atraumatic. clear sclera, pupils not pinpoint  CV:  Warm and well perfused.   LUNGS:   Normal work of breathing, no supplemental O2  EXTREMITIES:  No clubbing, cyanosis noted.  NEUROLOGIC:    The patient is alert and oriented, speech fluent, normal language.   MUSCULOSKELETAL:    Motor function  preserved.   GAIT:  The patient rises from a seated position with no difficulty and ambulates with nonantalgic gait without the assistance of a walking aid.   SKIN:   No obvious rashes, lesions, or erythema.  PSY:   Appropriate affect. No overt pain behaviors. No evidence of psychomotor retardation or agitation, no signs of intoxication.  We are delivering comprehensive, continuous, longitudinal care for this patient with chronic pain.

## 2023-06-01 ENCOUNTER — Ambulatory Visit: Admit: 2023-06-01 | Discharge: 2023-06-01 | Payer: Medicare (Managed Care)

## 2023-06-01 ENCOUNTER — Ambulatory Visit
Admit: 2023-06-01 | Discharge: 2023-06-01 | Payer: Medicare (Managed Care) | Attending: Psychologist | Primary: Psychologist

## 2023-06-01 DIAGNOSIS — M47812 Spondylosis without myelopathy or radiculopathy, cervical region: Principal | ICD-10-CM

## 2023-06-01 DIAGNOSIS — F419 Anxiety disorder, unspecified: Principal | ICD-10-CM

## 2023-06-01 DIAGNOSIS — M7918 Myalgia, other site: Principal | ICD-10-CM

## 2023-06-01 DIAGNOSIS — M170 Bilateral primary osteoarthritis of knee: Principal | ICD-10-CM

## 2023-06-01 DIAGNOSIS — M5417 Radiculopathy, lumbosacral region: Principal | ICD-10-CM

## 2023-06-01 DIAGNOSIS — M47816 Spondylosis without myelopathy or radiculopathy, lumbar region: Principal | ICD-10-CM

## 2023-06-01 DIAGNOSIS — F4321 Adjustment disorder with depressed mood: Principal | ICD-10-CM

## 2023-06-01 DIAGNOSIS — F1190 Opioid use, unspecified, uncomplicated: Principal | ICD-10-CM

## 2023-06-01 MED ORDER — BACLOFEN 20 MG TABLET
ORAL_TABLET | 2 refills | 0.00000 days
Start: 2023-06-01 — End: ?

## 2023-06-01 MED ORDER — OXYCODONE-ACETAMINOPHEN 10 MG-325 MG TABLET
ORAL_TABLET | ORAL | 0 refills | 20.00000 days | Status: CP | PRN
Start: 2023-06-01 — End: ?

## 2023-06-01 MED ORDER — GABAPENTIN 800 MG TABLET
ORAL_TABLET | Freq: Two times a day (BID) | ORAL | 0 refills | 90.00000 days | Status: CP
Start: 2023-06-01 — End: ?

## 2023-06-01 MED ORDER — LIDOCAINE 5 % TOPICAL PATCH
MEDICATED_PATCH | TRANSDERMAL | 2 refills | 30.00000 days | Status: CP
Start: 2023-06-01 — End: ?

## 2023-06-01 NOTE — Unmapped (Signed)
 [facility] Refill Coordination Note    Samanthan Vivirito, [place]: 11/30/1970  Phone: There are no phone numbers on file.      All above HIPAA information was verified with patient.         05/31/2023     8:31 PM   Specialty Rx Medication Refill Questionnaire   Which Medications would you like refilled and shipped? Xolair   Please list all current allergies: See chart   Have you missed any doses in the last 30 days? No   Have you had any changes to your medication(s) since your last refill? No   How many days remaining of each medication do you have at home? 7 days   If receiving an injectable medication, next injection date is 06/03/2023   Have you experienced any side effects in the last 30 days? No   Please enter the full address (street address, city, state, zip code) where you would like your medication(s) to be delivered to. [address], [place] [address]   Please specify on which day you would like your medication(s) to arrive. Note: if you need your medication(s) within 3 days, please call the pharmacy to schedule your order at [phone]  06/07/2023   Has your insurance changed since your last refill? No   Would you like a pharmacist to call you to discuss your medication(s)? No   Do you require a signature for your package? (Note: if we are billing Medicare Part B or your order contains a controlled substance, we will require a signature) No   I have been provided my out of pocket cost for my medication and approve the pharmacy to charge the amount to my credit card on file. Yes         Completed refill call assessment today to schedule patient's medication shipment from the [facility] [phone]).  All relevant notes have been reviewed.       Confirmed patient received a Conservation officer, historic buildings and a Surveyor, mining with first shipment. The patient will receive a drug information handout for each medication shipped and additional FDA Medication Guides as required.         REFERRAL TO PHARMACIST     Referral to the pharmacist: Not needed      [facility]     Shipping address confirmed in Epic.     Delivery Scheduled: Yes, Expected medication delivery date: 5/13.     Medication will be delivered via Same Day Courier to the prescription address in Epic WAM.    Pearson Bounds, PharmD   [facility] Specialty Pharmacist

## 2023-06-01 NOTE — Unmapped (Signed)
 Medication:oxyCODONE-acetaminophen (PERCOCET) 10-325 mg per tablet   ZOX:WRUE 1 tablet by mouth every four (4) hours as needed for pain. Max 4 tabs/day.   Quantity on RX: #180  Filled on:05/19/2023  Pill count today: #115

## 2023-06-01 NOTE — Unmapped (Signed)
It was good to see you today.    I have refilled your medications with no changes.    We will see you in 2 months, or sooner if needed.

## 2023-06-01 NOTE — Unmapped (Signed)
 [facility]   Confidential Psychological Therapy Session    Patient Name: Evangeline Amonett  Medical Record Number: [id number]  Date of Service: Jun 01, 2023  Attending Psychologist: Donette Furlong, PhD  CPT Procedure Code: 64403 for 45 minutes of face to face counseling    IN PERSON VISIT    REFERRING PHYSICIAN: Hulen Mages, MD    CHIEF COMPLAINT AND REASON FOR VISIT:  COM follow up evaluation/pain coping skills; CBT/ACT for pain; grief    SUBJECTIVE / HISTORY OF PRESENT ILLNESS: Ms.  Marse is a very pleasant 53 y.o.  female with chronic lower back pain, chronic headaches, and myofascial pain. Her pain started in 1999 in her lower back.  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  She had a spinal fusion in 2001 at the L5-S1 level.  She has had bilateral shoulder pain, neck pain, lower back pain, and b/l knees.  She also described a radiculopathy b/l that goes from her back to her bilateral buttocks and down to her toes.      Pt initially established with Dr. Socorro Dunks in 08/2016 and then was was first evaluated by me in 07/2017. Last follow up with me was 04/15/2023.  Patient participates actively today.  Had an appointment with the nurse practitioner in clinic today and has had continued numbness and pain down her legs particularly down her left leg and into her foot.  This started after her cervical ablation, with calls unclear.  Discussed different reason she might be experiencing a pain flare including a flare of her fibromyalgia.  Retouched on activity rest cycling and pacing in different relationships and social dynamics that impact her stress level.  Retouched on motivation and some strategies she can utilize to help her focus on balance and flexibility, addressing self-care needs.  This helps with her own psychological adjustment and reinforces flexibility and acceptance.  Spent extra time today addressing some changes in how she can care for others, so as to her mood other family members being more independent and not getting used to her doing things for them specifically.        OBJECTIVE / MENTAL STATUS:    Appearance:   Appears stated age and Clean/Neat   Motor:  No abnormal movements   Speech/Language:   Normal rate, volume, tone, fluency   Mood:  Irritable   Affect:  Blunted   Thought process:  Logical, linear, clear, coherent, goal directed   Thought content:    Denies SI, HI, self harm, delusions, obsessions, paranoid ideation, or ideas of reference   Perceptual disturbances:    Denies auditory and visual hallucinations, behavior not concerning for response to internal stimuli   Orientation:  Oriented to person, place, time, and general circumstances   Attention:  Able to fully attend without fluctuations in consciousness   Concentration:  Able to fully concentrate and attend   Memory:  Immediate, short-term, long-term, and recall grossly intact    Fund of knowledge:   Consistent with level of education and development   Insight:    Fair   Judgment:   Intact   Impulse Control:  Intact       DIAGNOSTIC IMPRESSION:   Anxiety NOS  Chronic continuou use of opiates  Grief    ASSESSMENT:   Ms.  Duchene is a very pleasant 53 y.o.  female from [place], [place] with chronic lower back pain, chronic headaches, and myofascial pain. Her pain  started in 1999 in her lower back.  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  She had a spinal fusion in 2001 at the L5-S1 level.  She has had bilateral shoulder pain, neck pain, lower back pain, and b/l knees.  She also described a radiculopathy b/l that goes from her back to her bilateral buttocks and down to her toes.  The patient is currently considered to be high risk due to prior nonadherence, but appropriate with a behavioral adherence plan in place.  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 She continues to participate actively in psychotherapy.    PLAN:   (1) COM - high risk but remains appropriate with a behavioral adherence plan also in place.    -Patient MUST meet with pain psychology/me once a month.   -No additional overuse of opioids will be tolerated.    -Patient should always bring her medication to clinic for pill counts.  -Patient was informed she has had numerous infractions with respect to her pain contract over the years, and that no further infractions will be tolerated.    If any additional pain contract or behavioral adherence contract infractions occur, I recommend stopping opioids.    Pt doing well with adherence, per our last few visits.     (2) Pain coping skills- addressed compliance, as well as continued skills practice    (3) follow-in 1-2 months    Answers submitted by the patient for this visit:  Back Pain Questionnaire (Submitted on 05/31/2023)  Chief Complaint: Back pain

## 2023-06-01 NOTE — Unmapped (Signed)
 Reviewed with patient, their AVS can be viewed through their My Chart.  Pt voiced understanding

## 2023-06-02 DIAGNOSIS — Z23 Encounter for immunization: Principal | ICD-10-CM

## 2023-06-02 NOTE — Unmapped (Signed)
 Patient left voice mail as well as sent MyChart message requesting her Work note be revised.  I need a note stating I can sitt when I need to take a break.  Now it says I can't stand for long hours.  Returned call to patient, advised that I received both her messages and her MyChart message for forwarded to the provider.  Asked that she allow the provider time to review her message and chart.  Advised this would be done in-between patients, as she is in clinic seeing patients today.  Patient verbalized understanding.

## 2023-06-02 NOTE — Unmapped (Signed)
 Phone call to patient to advise a new Work Letter has been sent to her MyChart.  Patient confirmed receipt and thank us .

## 2023-06-07 MED FILL — XOLAIR 150 MG/ML SUBCUTANEOUS SYRINGE: SUBCUTANEOUS | 28 days supply | Qty: 4 | Fill #7

## 2023-06-08 DIAGNOSIS — M170 Bilateral primary osteoarthritis of knee: Principal | ICD-10-CM

## 2023-06-08 MED ORDER — DICLOFENAC 20 MG/GRAM/ACTUATION (2 %) TOPICAL SOLN METERED-DOSE PUMP
Freq: Two times a day (BID) | TOPICAL | 0 refills | 0.00000 days | Status: CP
Start: 2023-06-08 — End: ?

## 2023-06-13 ENCOUNTER — Encounter: Admit: 2023-06-13 | Discharge: 2023-06-14 | Payer: Medicare (Managed Care)

## 2023-06-13 DIAGNOSIS — R12 Heartburn: Principal | ICD-10-CM

## 2023-06-13 DIAGNOSIS — R1010 Upper abdominal pain, unspecified: Principal | ICD-10-CM

## 2023-06-13 DIAGNOSIS — K219 Gastro-esophageal reflux disease without esophagitis: Principal | ICD-10-CM

## 2023-06-13 MED ORDER — FAMOTIDINE 20 MG TABLET
ORAL_TABLET | Freq: Two times a day (BID) | ORAL | 0 refills | 0.00000 days | PRN
Start: 2023-06-13 — End: 2024-06-12

## 2023-06-13 NOTE — Unmapped (Signed)
 The patient reports they are physically located in [place]  and is currently: at home. I conducted a audio/video visit. I spent  18m 16s on the video call with the patient. I spent an additional 6 minutes on pre- and post-visit activities on the date of service .     Name:  Natalie [name]  DOB: March 24, 1970  Today's Date: 06/13/2023  Age:  53 y.o.    Assessment/Plan:      Natalie [name] was seen today for nausea and stomach hurts after she eats.    Diagnoses and all orders for this visit:    Gastroesophageal reflux disease without esophagitis  -     famotidine (PEPCID) 20 MG tablet; Take 1 tablet (20 mg total) by mouth two (2) times a day as needed for heartburn.    Heartburn  -     famotidine (PEPCID) 20 MG tablet; Take 1 tablet (20 mg total) by mouth two (2) times a day as needed for heartburn.    Pain of upper abdomen  -     famotidine (PEPCID) 20 MG tablet; Take 1 tablet (20 mg total) by mouth two (2) times a day as needed for heartburn.      Assessment & Plan  Gastroesophageal reflux disease (GERD)  GERD with postprandial abdominal pain, morning acid reflux, and supine nausea. Current pantoprazole therapy inadequate. Ibuprofen may exacerbate symptoms.  - Prescribe famotidine twice daily with pantoprazole.  - Continue pantoprazole once daily before breakfast.  - Avoid NSAIDs.  - Avoid spicy, fatty foods, sodas, citrus juices, tomatoes.  - Advise not to lie down within two hours postprandially.  - If symptoms persist after one week, consider increasing pantoprazole to twice daily for up to two weeks.    Diagnosis and plan along with any newly prescribed medication(s) were discussed in detail with this patient today. The patient verbalized understanding and agreed with the plan without language barriers or behavioral barriers to understanding unless otherwise noted.    Subjective:      HPI: Natalie [name] is a 53 y.o. female is here for    Chief Complaint   Patient presents with    Nausea and stomach hurts after she eats     It's been going on for about a week.      History of Present Illness  Natalie [name] is a 53 year old female who presents with abdominal pain and symptoms suggestive of acid reflux.    She has been experiencing a dull, achy pain in her abdomen that occurs after eating or drinking, lasting about an hour and a half postprandially. This has persisted for approximately one week.    She experiences symptoms of acid reflux, particularly in the mornings, which awakens her when she gets up to urinate. She describes a sensation of heat in her throat and chest. Nausea is present when lying in bed with her eyes closed and occurs intermittently throughout the day, often postprandially.    She takes pantoprazole once daily in the morning before breakfast. Despite this, the pain has been severe enough to cause her to miss work recently. No vomiting or dysphagia. She has not taken any Pepcid recently. She was previously taking ibuprofen mixed with acetaminophen for headaches and neck pain but has discontinued it.    She has a history of similar acid reflux issues in the past but has not undergone any endoscopic procedures. No recent dietary changes or consumption of spicy, fatty, or greasy foods.  Past Medical/Surgical History:     Past Medical History:   Diagnosis Date    Allergic conjunctivitis 11/23/2018    Allergic rhinitis     Anemia 1986    Arthritis     Breast mass     Chondromalacia of left knee     Chondromalacia of right knee     Chronic kidney disease     Cluster headache     Depressive disorder     Dizziness     Fibrocystic breast     GERD (gastroesophageal reflux disease)     Headache, tension-type     Hematuria     History of kidney stones     History of transfusion     Spinal Fusion    Hyperlipidemia 1999    Hypothyroidism     Kidney stone     Lumbar disc disease 1998    She was injured at work at the age of 23 and then had disk surgery 2 years later     Menopause ovarian failure Menopause, premature     Migraine with aura     Neuromuscular disorder   1999    Obesity (BMI 30-39.9) 11/23/2018    Ovarian cyst     Plantar fasciitis     Pseudotumor cerebri     Rash due to allergy 11/23/2018    Scoliosis     Sickle cell trait (CMS-HCC)     Sleep apnea     Urinary tract infection     Vaginitis      Past Surgical History:   Procedure Laterality Date    BACK SURGERY  2001    BREAST BIOPSY Bilateral     When patient was 16 & 17 Both Negative    EPIDURAL BLOCK INJECTION      HYSTERECTOMY  2008    PR REMOVAL OF TONSILS,12+ Y/O Bilateral 10/10/2019    Procedure: TONSILLECTOMY;  Surgeon: Trudie Fuse, MD;  Location: OR [facility];  Service: ENT    SALPINGOOPHORECTOMY Bilateral 2007    SPINAL FUSION      SPINE SURGERY  2001    TOTAL VAGINAL HYSTERECTOMY  01/04/2006    Uterine Prolapse-Dr. Jenkins [facility] OP Note    TRIGGER POINT INJECTION      TUBAL LIGATION  1995       Family History:     Family History   Problem Relation Age of Onset    Hypertension Mother     Heart disease Mother     Arthritis Mother     Depression Mother     Drug abuse Mother     Mental illness Mother     Ulcers Mother     COPD Mother     Allergic rhinitis Mother     Heart attack Father     Mental illness Father     Asthma Father     Cancer Sister         Lymphoma    Heart attack Maternal Grandmother     Heart disease Maternal Grandmother     Hypertension Maternal Grandmother     Thyroid disease Maternal Grandmother     Stroke Paternal Grandfather     Depression Son     Drug abuse Son     Mental illness Son     COPD Maternal Aunt     Heart attack Cousin 64    Breast cancer Cousin 32    Thyroid disease Other     Cancer Sister     COPD  Maternal Aunt     Migraines Maternal Aunt     Depression Son     Drug abuse Son     Mental illness Son     Alcohol abuse Neg Hx        Social History:     Social History     Socioeconomic History    Marital status: Single     Spouse name: None    Number of children: 2    Years of education: 15 Highest education level: None   Occupational History     Employer: NOT EMPLOYED    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   Tobacco Use    Smoking status: Never     Passive exposure: Past    Smokeless tobacco: Never   Vaping Use    Vaping status: Never Used   Substance and Sexual Activity    Alcohol use: Yes     Comment: Only drink for really special occasions    Drug use: Never    Sexual activity: Yes     Partners: Male     Birth control/protection: Post-menopausal, Surgical     Comment: steady boyfriend   Social History Narrative    Marital Status - Single     Children - Son(s) [2]    Pets - Dog (1) Turtle (1)     Household - Lives with sons and grandson Lynder Sanger)     Occupation - Disabled since 2002    Designer, fashion/clothing Use - Denies      Diet - Regular    Exercise/Sports - Walking     Hobbies - Conservation officer, historic buildings - [employer] Degree Psychologist, forensic)     Country of Origin - [place]                       Social Drivers of Psychologist, prison and probation services Strain: Low Risk  (06/12/2023)    Overall Financial Resource Strain (CARDIA)     Difficulty of Paying Living Expenses: Not hard at all   Food Insecurity: No Food Insecurity (06/12/2023)    Hunger Vital Sign     Worried About Running Out of Food in the Last Year: Never true     Ran Out of Food in the Last Year: Never true   Recent Concern: Food Insecurity - Food Insecurity Present (05/18/2023)    Hunger Vital Sign     Worried About Running Out of Food in the Last Year: Never true     Ran Out of Food in the Last Year: Sometimes true   Transportation Needs: No Transportation Needs (06/12/2023)    PRAPARE - Transportation     Lack of Transportation (Medical): No     Lack of Transportation (Non-Medical): No   Stress: Stress Concern Present (06/16/2022)    [employer] of Occupational Health - Occupational Stress Questionnaire     Feeling of Stress : To some extent   Housing: Low Risk  (06/12/2023)    Housing     Within the past 12 months, have you ever stayed: outside, in a car, in a tent, in an overnight shelter, or temporarily in someone else's home (i.e. couch-surfing)?: No     Are you worried about losing your housing?: No       Allergies:     Buprenorphine hcl, Pregabalin, Buprenorphine, Cephalexin monohydrate, Doxycycline, Oxycodone hcl, Adhesive tape-silicones, Doxycycline hyclate (bulk), Keflex [cephalexin], Opioids - morphine analogues, and Oxycodone-acetaminophen  Current Medications:     Current Outpatient Medications   Medication Sig Dispense Refill    albuterol HFA 90 mcg/actuation inhaler Inhale 2 puffs every six (6) hours as needed for wheezing or shortness of breath. 54 g 0    azelastine (ASTELIN) 137 mcg (0.1 %) nasal spray 2 sprays into each nostril two (2) times a day. 30 mL 5    azelastine (OPTIVAR) 0.05 % ophthalmic solution Administer 2 drops to both eyes daily as needed. 6 mL 5    baclofen (LIORESAL) 20 MG tablet 10 - 20 mg three times a day as needed for spasms. 90 tablet 2    diclofenac sodium 20 mg/gram /actuation(2 %) sopm Apply 40 mg topically two (2) times a day. 112 g 0    gabapentin (NEURONTIN) 800 MG tablet Take 1 tablet (800 mg total) by mouth two (2) times a day. 180 tablet 0    ipratropium (ATROVENT) 21 mcg (0.03 %) nasal spray 2 sprays into each nostril four (4) times a day. 30 mL 1    lidocaine (LIDODERM) 5 % patch Place 1 patch on the skin daily. Apply up to 1 patches at a time.  Use for up to 12 hours/day. 30 patch 2    omalizumab (XOLAIR) 150 mg/mL syringe Inject 2 mL (300 mg total) under the skin every fourteen (14) days. 4 mL 11    oxyCODONE-acetaminophen (PERCOCET) 10-325 mg per tablet Take 1 tablet by mouth every four (4) hours as needed for pain. Max 4 tabs/day. Fill on or after: 04/02/23. Brand name only due to allergy. 120 tablet 0    pantoprazole (PROTONIX) 40 MG tablet TAKE 1 TABLET(40 MG) BY MOUTH DAILY 30 MINUTES BEFORE BREAKFAST AS NEEDED 90 tablet 2    potassium chloride 20 MEQ ER tablet Take 1 tablet (20 mEq total) by mouth daily. 90 tablet 3    rosuvastatin (CRESTOR) 10 MG tablet Take 1 tablet (10 mg total) by mouth nightly. 90 tablet 3    UBRELVY 50 mg tablet TAKE 1 TABLET BY MOUTH FOR HEADACHE. MAY REPEAT IN 2 HOURS IF NEEDED. LIMIT 2 TABLETS PER DAY AND NO MORE THAN 4 TABLETS PER WEEK 16 tablet 3    XHANCE 93 mcg/actuation AerB 1 spray into each nostril two (2) times a day as needed. 16 mL 11    empty container Misc Use as directed to dispose of Xolair syringes 1 each 2    EPINEPHrine (EPIPEN) 0.3 mg/0.3 mL injection Inject 0.3 mL (0.3 mg total) into the muscle once as needed for anaphylaxis (Difficulty breathing, throat closing, etc) for up to 1 dose. 1 each 12    famotidine (PEPCID) 20 MG tablet Take 1 tablet (20 mg total) by mouth two (2) times a day as needed for heartburn. 60 tablet 1    hydroxychloroquine (PLAQUENIL) 200 mg tablet Take 1 tablet (200 mg total) by mouth two (2) times a day. (Patient not taking: Reported on 06/13/2023) 60 tablet 12    naloxone (NARCAN) 4 mg nasal spray 1 spray into alternating nostrils once as needed (opioid overdose.). One spray in either nostril once for known/suspected opioid overdose. May repeat every 2-3 minutes in alternating nostril til EMS arrives 2 each 2    [START ON 06/21/2023] oxyCODONE-acetaminophen (PERCOCET) 10-325 mg per tablet Take 1 tablet by mouth every four (4) hours as needed for pain. Max 6 tabs/day. Fill on or after: 06/21/23. Brand name only due to allergy. 180 tablet 0    [START ON  07/21/2023] oxyCODONE-acetaminophen (PERCOCET) 10-325 mg per tablet Take 1 tablet by mouth every four (4) hours as needed for pain. Max 4 tabs/day. Fill on or after: 07/21/23. Brand name only due to allergy. 120 tablet 0    potassium chloride 20 mEq TbER TAKE 1 TABLET BY MOUTH DAILY 90 tablet 2     No current facility-administered medications for this visit.       ROS:     Review of Systems    Vital Signs:     Wt Readings from Last 3 Encounters:   06/01/23 77.1 kg (170 lb)   05/18/23 78.8 kg (173 lb 11.2 oz)   05/05/23 77.1 kg (170 lb)     Temp Readings from Last 3 Encounters:   05/18/23 36.6 ??C (97.8 ??F) (Temporal)   04/13/23 36.3 ??C (97.4 ??F) (Temporal)   02/23/23 36.5 ??C (97.7 ??F)     BP Readings from Last 3 Encounters:   06/01/23 140/89   05/18/23 122/78   05/05/23 152/86     Pulse Readings from Last 3 Encounters:   06/01/23 69   05/18/23 77   05/05/23 80     There is no height or weight on file to calculate BMI.    Objective:      Physical Exam  Nursing note reviewed.   Constitutional:       General: She is not in acute distress.     Appearance: She is well-developed.   Pulmonary:      Effort: Pulmonary effort is normal. No respiratory distress.   Neurological:      Mental Status: She is alert and oriented to person, place, and time.   Psychiatric:         Behavior: Behavior normal.         Thought Content: Thought content normal.         Judgment: Judgment normal.           Labs:     No results found for this visit on 06/13/23.    Zoraida Hirschfeld, MD  06/13/2023

## 2023-06-13 NOTE — Unmapped (Signed)
 Message from patient who states her [facility] told her they will no longer be able to order Brand Percocet.  Patient is due for fill 06/21/23.  Called pharmacy spoke with pharmacist, Diden P who stated that as of today they cannot accept any new Rxs for Brand Percocet.   Called patient back to ask what pharmacy patient wants to use instead.  Patient stated [facility].  Advised [facility] to [facility] and ask if they carry the Brand.  Patient has an allergy to generic.    [facility] [address] [place].  Phone [phone]  Patient will call back.

## 2023-06-14 DIAGNOSIS — M47816 Spondylosis without myelopathy or radiculopathy, lumbar region: Principal | ICD-10-CM

## 2023-06-14 DIAGNOSIS — M47812 Spondylosis without myelopathy or radiculopathy, cervical region: Principal | ICD-10-CM

## 2023-06-14 DIAGNOSIS — M170 Bilateral primary osteoarthritis of knee: Principal | ICD-10-CM

## 2023-06-14 MED ORDER — FAMOTIDINE 20 MG TABLET
ORAL_TABLET | 0 refills | 0.00000 days
Start: 2023-06-14 — End: ?

## 2023-06-14 NOTE — Unmapped (Signed)
 Message from patient who states her [facility] told her they will no longer be able to order Brand Percocet.  Patient is due for fill 06/21/23.    Called pharmacy spoke with pharmacist, Diden P who stated that as of 06/13/23 they cannot accept any new Rxs for Brand Percocet.    Patient would like her May and June Rx's Brand-Only sent to [facility], [address], [place] [address]

## 2023-06-14 NOTE — Unmapped (Signed)
 Let me know which pharmacy she wants me to send refills.

## 2023-06-16 ENCOUNTER — Ambulatory Visit: Admit: 2023-06-16 | Payer: Medicare (Managed Care) | Attending: Vascular Neurology | Primary: Vascular Neurology

## 2023-06-21 DIAGNOSIS — G43E01 Chronic migraine with aura, not intractable, with status migrainosus: Principal | ICD-10-CM

## 2023-06-21 MED ORDER — UBRELVY 50 MG TABLET
ORAL_TABLET | ORAL | 3 refills | 0.00000 days | Status: CP
Start: 2023-06-21 — End: ?

## 2023-06-21 MED ORDER — OXYCODONE-ACETAMINOPHEN 10 MG-325 MG TABLET
ORAL_TABLET | ORAL | 0 refills | 30.00000 days | Status: CP | PRN
Start: 2023-06-21 — End: ?

## 2023-06-21 NOTE — Unmapped (Signed)
 Patient called back to say she has been unable to locate a pharmacy that can order name brand Percocet.  Patient took her last tablet this morning.  Patient states her pharmacy has 117 of her #180 tablets.  Advised patient to go ahead and fill whatever the pharmacy can fill and let me know.  I will verify the amount dispensed and request another Rx from the provider.  Advised patient the 2nd Rx won't be available for fill until a day or two prior to when she is out of the #117.

## 2023-06-27 ENCOUNTER — Ambulatory Visit: Admit: 2023-06-27 | Discharge: 2023-06-28 | Payer: Medicare (Managed Care)

## 2023-06-27 DIAGNOSIS — M170 Bilateral primary osteoarthritis of knee: Principal | ICD-10-CM

## 2023-06-27 MED ADMIN — bupivacaine HCl (MARCAINE) 0.5 % (5 mg/mL) injection 25 mg: 5 mL | @ 20:00:00 | Stop: 2023-06-27

## 2023-06-27 MED ADMIN — triamcinolone acetonide (KENALOG) injection 20 mg: 20 mg | INTRA_ARTICULAR | @ 20:00:00 | Stop: 2023-06-27

## 2023-06-27 NOTE — Unmapped (Signed)
 PATIENT: Natalie [name]      AGE: 53 y.o.     DOB: 1970/10/10         Date of Examination:  06/27/2023      Primary Care Provider:  Zoraida Hirschfeld, MD     Chief Complaint     Chief Complaint   Patient presents with    Left Knee - Follow-up        History Of Present Illness   Natalie [name] presents for a follow-up ON 02/21/2023 of left knee severe medial compartment osteoarthritis and right knee osteoarthritis. She is a patient of Dr. Jacquelyn Matt and has been treated with steroid injections and viscosupplementation.  Her last steroid injection to the left knee was in November and she noted improvement with this. She underwent Gelsyn injection series last month and feels the injections did not help her left knee significantly but feels her right knee is about 98% improved. She feels the one-injection series was more beneficial for the left knee.  She also had bilateral knee viscosupplementation in March with months of relief.  She would like to consider repeat steroid injection for the left knee today.  She is taking Tylenol for pain.     UPDATE 06/27/2023: Patient presents for follow-up on her bilateral knee osteoarthritis.  She received steroid injections in her left knee in January which provided significant relief for about 2 months. She had viscosupplementation for both knees in December with relief.    Home Medications     Current Outpatient Medications:     albuterol HFA 90 mcg/actuation inhaler, Inhale 2 puffs every six (6) hours as needed for wheezing or shortness of breath., Disp: 54 g, Rfl: 0    azelastine (ASTELIN) 137 mcg (0.1 %) nasal spray, 2 sprays into each nostril two (2) times a day., Disp: 30 mL, Rfl: 5    azelastine (OPTIVAR) 0.05 % ophthalmic solution, Administer 2 drops to both eyes daily as needed., Disp: 6 mL, Rfl: 5    baclofen (LIORESAL) 20 MG tablet, 10 - 20 mg three times a day as needed for spasms., Disp: 90 tablet, Rfl: 2    diclofenac sodium 20 mg/gram /actuation(2 %) sopm, Apply 40 mg topically two (2) times a day., Disp: 112 g, Rfl: 0    empty container Misc, Use as directed to dispose of Xolair syringes, Disp: 1 each, Rfl: 2    EPINEPHrine (EPIPEN) 0.3 mg/0.3 mL injection, Inject 0.3 mL (0.3 mg total) into the muscle once as needed for anaphylaxis (Difficulty breathing, throat closing, etc) for up to 1 dose., Disp: 1 each, Rfl: 12    famotidine (PEPCID) 20 MG tablet, Take 1 tablet (20 mg total) by mouth two (2) times a day as needed for heartburn., Disp: 60 tablet, Rfl: 1    gabapentin (NEURONTIN) 800 MG tablet, Take 1 tablet (800 mg total) by mouth two (2) times a day., Disp: 180 tablet, Rfl: 0    hydroxychloroquine (PLAQUENIL) 200 mg tablet, Take 1 tablet (200 mg total) by mouth two (2) times a day., Disp: 60 tablet, Rfl: 12    ipratropium (ATROVENT) 21 mcg (0.03 %) nasal spray, 2 sprays into each nostril four (4) times a day., Disp: 30 mL, Rfl: 1    lidocaine (LIDODERM) 5 % patch, Place 1 patch on the skin daily. Apply up to 1 patches at a time.  Use for up to 12 hours/day., Disp: 30 patch, Rfl: 2    naloxone (NARCAN) 4 mg nasal spray, 1  spray into alternating nostrils once as needed (opioid overdose.). One spray in either nostril once for known/suspected opioid overdose. May repeat every 2-3 minutes in alternating nostril til EMS arrives, Disp: 2 each, Rfl: 2    omalizumab (XOLAIR) 150 mg/mL syringe, Inject 2 mL (300 mg total) under the skin every fourteen (14) days., Disp: 4 mL, Rfl: 11    oxyCODONE-acetaminophen (PERCOCET) 10-325 mg per tablet, Take 1 tablet by mouth every four (4) hours as needed for pain. Max 4 tabs/day. Fill on or after: 04/02/23. Brand name only due to allergy., Disp: 120 tablet, Rfl: 0    oxyCODONE-acetaminophen (PERCOCET) 10-325 mg per tablet, Take 1 tablet by mouth every four (4) hours as needed for pain. Max 6 tabs/day. Fill on or after: 06/21/23. Brand name only due to allergy., Disp: 180 tablet, Rfl: 0    [START ON 07/21/2023] oxyCODONE-acetaminophen (PERCOCET) 10-325 mg per tablet, Take 1 tablet by mouth every four (4) hours as needed for pain. Max 4 tabs/day. Fill on or after: 07/21/23. Brand name only due to allergy., Disp: 120 tablet, Rfl: 0    pantoprazole (PROTONIX) 40 MG tablet, TAKE 1 TABLET(40 MG) BY MOUTH DAILY 30 MINUTES BEFORE BREAKFAST AS NEEDED, Disp: 90 tablet, Rfl: 2    potassium chloride 20 MEQ ER tablet, Take 1 tablet (20 mEq total) by mouth daily., Disp: 90 tablet, Rfl: 3    potassium chloride 20 mEq TbER, TAKE 1 TABLET BY MOUTH DAILY, Disp: 90 tablet, Rfl: 2    rosuvastatin (CRESTOR) 10 MG tablet, Take 1 tablet (10 mg total) by mouth nightly., Disp: 90 tablet, Rfl: 3    UBRELVY 50 mg tablet, TAKE 1 TABLET BY MOUTH FOR HEADACHE. MAY REPEAT IN 2 HOURS IF NEEDED. LIMIT 2 TABLETS PER DAY AND NO MORE THAN 4 TABLETS PER WEEK, Disp: 16 tablet, Rfl: 3    XHANCE 93 mcg/actuation AerB, 1 spray into each nostril two (2) times a day as needed., Disp: 16 mL, Rfl: 11  No current facility-administered medications for this visit.     Allergies   Buprenorphine hcl, Pregabalin, Buprenorphine, Cephalexin monohydrate, Doxycycline, Oxycodone hcl, Adhesive tape-silicones, Doxycycline hyclate (bulk), Keflex [cephalexin], Opioids - morphine analogues, and Oxycodone-acetaminophen    Medical History     Past Medical History:   Diagnosis Date    Allergic conjunctivitis 11/23/2018    Allergic rhinitis     Anemia 1986    Arthritis     Breast mass     Chondromalacia of left knee     Chondromalacia of right knee     Chronic kidney disease     Cluster headache     Depressive disorder     Dizziness     Fibrocystic breast     GERD (gastroesophageal reflux disease)     Headache, tension-type     Hematuria     History of kidney stones     History of transfusion     Spinal Fusion    Hyperlipidemia 1999    Hypothyroidism     Kidney stone     Lumbar disc disease 1998    She was injured at work at the age of 53 and then had disk surgery 2 years later Menopause ovarian failure     Menopause, premature     Migraine with aura     Neuromuscular disorder   1999    Obesity (BMI 30-39.9) 11/23/2018    Ovarian cyst     Plantar fasciitis     Pseudotumor cerebri  Rash due to allergy 11/23/2018    Scoliosis     Sickle cell trait (CMS-HCC)     Sleep apnea     Urinary tract infection     Vaginitis        Surgical History     Past Surgical History:   Procedure Laterality Date    BACK SURGERY  2001    BREAST BIOPSY Bilateral     When patient was 16 & 17 Both Negative    EPIDURAL BLOCK INJECTION      HYSTERECTOMY  2008    PR REMOVAL OF TONSILS,13+ Y/O Bilateral 10/10/2019    Procedure: TONSILLECTOMY;  Surgeon: Trudie Fuse, MD;  Location: OR [place];  Service: ENT    SALPINGOOPHORECTOMY Bilateral 2007    SPINAL FUSION      SPINE SURGERY  2001    TOTAL VAGINAL HYSTERECTOMY  01/04/2006    Uterine Prolapse-Dr. Jenkins [facility] OP Note    TRIGGER POINT INJECTION      TUBAL LIGATION  1995       Social History     Social History     Tobacco Use    Smoking status: Never     Passive exposure: Past    Smokeless tobacco: Never   Vaping Use    Vaping status: Never Used   Substance Use Topics    Alcohol use: Yes     Comment: Only drink for really special occasions    Drug use: Never       Family History     Family History   Problem Relation Age of Onset    Hypertension Mother     Heart disease Mother     Arthritis Mother     Depression Mother     Drug abuse Mother     Mental illness Mother     Ulcers Mother     COPD Mother     Allergic rhinitis Mother     Heart attack Father     Mental illness Father     Asthma Father     Cancer Sister         Lymphoma    Heart attack Maternal Grandmother     Heart disease Maternal Grandmother     Hypertension Maternal Grandmother     Thyroid disease Maternal Grandmother     Stroke Paternal Grandfather     Depression Son     Drug abuse Son     Mental illness Son     COPD Maternal Aunt     Heart attack Cousin 58    Breast cancer Cousin 32 Thyroid disease Other     Cancer Sister     COPD Maternal Aunt     Migraines Maternal Aunt     Depression Son     Drug abuse Son     Mental illness Son     Alcohol abuse Neg Hx        Physical Exam          06/27/23 1548   BP: 152/87   Pulse: 55   Weight: 77.1 kg (170 lb)   Height: 154.9 cm (5' 1)      Pain Assessment  Pain Assessment: 0-10  0-10 Pain Scale: 5  Pain Location: Knee  Pain Orientation: Left  Pain Descriptors: Aching, Tingling, Sharp, Numbness  Pain Frequency: Constant/continuous    Appearance:  Well nourished/well developed.  Psychiatric:  Mood and affect appropriate.    Head:   Normocephalic, atraumatic.   Skin:  Clean, dry, no lesions, no rashes.  Neuro:   Alert and oriented.  Respiratory:  Unlabored.       FOCUSED EXAM:  Bilateral knees: Trace effusion. Tenderness along the medial joint lines greater on the left and tenderness on the left lateral joint line. Minimal tenderness to the patellofemoral joints. Full extension with flexion to 125 degrees. Patellar crepitation with motion. Normal gait. Varus deformity.      Imaging   No image results found.      Assessment & Plan       ICD-10-CM   1. Primary osteoarthritis of both knees  M17.0           Plan: Left knee severe medial compartment osteoarthritis, right knee osteoarthritis; Gelsyn injection series 12/2022 with relief on the right knee but recurrent symptoms on the left knee - steroid injection on the left knee in 01/2023 with relief; recent exacerbation     We discussed the diagnosis and further treatment for her knee.  She will begin icing the knees 20 minutes daily.  She may take tylenol 500-1000mg  twice daily as needed.  She has done well in the past with steroid injections and would like to repeat this in the knees today.  We reviewed risks and benefits and she desired to proceed.     Procedure: After sterile prep, 5 cc 0.5% Marcaine and 2 cc Kenalog was injected into the right knee.  The patient tolerated the procedure well.    Procedure: After sterile prep, 5 cc 0.5% Marcaine and 2 cc Kenalog was injected into the left knee.  The patient tolerated the procedure well.     We also discussed repeat viscosupplementation and may consider the one-injection as she had more improvement with this than the 3-injection series - we will try for a repeat series in the upcoming weeks for bilateral knees following insurance approval.                Miles Allan Johnmatthew Solorio, PA  Date: 06/27/2023  Time: 7:48 PM    This note was created utilizing Dragon Medical dictation software and may contain transcription errors missed during proofreading.

## 2023-06-27 NOTE — Unmapped (Signed)
 KH, can you schedule repeat visco for this patient - bilateral knees? Thank you

## 2023-06-29 DIAGNOSIS — M47816 Spondylosis without myelopathy or radiculopathy, lumbar region: Principal | ICD-10-CM

## 2023-06-29 DIAGNOSIS — M47812 Spondylosis without myelopathy or radiculopathy, cervical region: Principal | ICD-10-CM

## 2023-06-29 DIAGNOSIS — M170 Bilateral primary osteoarthritis of knee: Principal | ICD-10-CM

## 2023-06-29 MED ORDER — OXYCODONE-ACETAMINOPHEN 10 MG-325 MG TABLET
ORAL_TABLET | ORAL | 0 refills | 11.00000 days | Status: CP | PRN
Start: 2023-06-29 — End: ?

## 2023-06-29 NOTE — Unmapped (Signed)
 Submitted for insurance benefits verification, no auth required , will contact patient to discuss/schedule once insurance benefits/auth obtained.

## 2023-06-29 NOTE — Unmapped (Signed)
 Patient received a partial fill of percocet in May.  Confirmed in PDMP.  Remainder sent to pharmacy.  She has her regular script for June and then has a July follow up.    Requested Prescriptions     Signed Prescriptions Disp Refills    oxyCODONE-acetaminophen (PERCOCET) 10-325 mg per tablet 63 tablet 0     Sig: Take 1 tablet by mouth every four (4) hours as needed for pain. Max 6 tabs/day. Fill on or after: 06/29/23 (This is the remainder for the partial fill patient received in May).

## 2023-06-30 DIAGNOSIS — R928 Other abnormal and inconclusive findings on diagnostic imaging of breast: Principal | ICD-10-CM

## 2023-07-01 NOTE — Unmapped (Signed)
 LMTCB to discuss and schedule No Auth required . KH

## 2023-07-02 DIAGNOSIS — N951 Menopausal and female climacteric states: Principal | ICD-10-CM

## 2023-07-02 MED ORDER — ESTRADIOL 0.5 MG TABLET
ORAL_TABLET | Freq: Every day | ORAL | 1 refills | 0.00000 days
Start: 2023-07-02 — End: ?

## 2023-07-04 MED ORDER — ESTRADIOL 0.5 MG TABLET
ORAL_TABLET | Freq: Every day | ORAL | 1 refills | 30.00000 days
Start: 2023-07-04 — End: ?

## 2023-07-04 NOTE — Unmapped (Signed)
 The original prescription was discontinued on 10/25/2022 by Adelina Adu, MD. Renewing this prescription may not be appropriate.

## 2023-07-04 NOTE — Unmapped (Signed)
 Benefits verification obtained, No Auth required  . Patient called, discussed and scheduled.

## 2023-07-05 ENCOUNTER — Ambulatory Visit: Admit: 2023-07-05 | Discharge: 2023-07-06 | Payer: Medicare (Managed Care)

## 2023-07-05 DIAGNOSIS — E870 Hyperosmolality and hypernatremia: Principal | ICD-10-CM

## 2023-07-05 DIAGNOSIS — R7989 Other specified abnormal findings of blood chemistry: Principal | ICD-10-CM

## 2023-07-05 DIAGNOSIS — Z23 Encounter for immunization: Principal | ICD-10-CM

## 2023-07-05 DIAGNOSIS — R232 Flushing: Principal | ICD-10-CM

## 2023-07-05 DIAGNOSIS — Z114 Encounter for screening for human immunodeficiency virus [HIV]: Principal | ICD-10-CM

## 2023-07-05 DIAGNOSIS — Z7289 Other problems related to lifestyle: Principal | ICD-10-CM

## 2023-07-05 DIAGNOSIS — Z113 Encounter for screening for infections with a predominantly sexual mode of transmission: Principal | ICD-10-CM

## 2023-07-05 DIAGNOSIS — E0590 Thyrotoxicosis, unspecified without thyrotoxic crisis or storm: Principal | ICD-10-CM

## 2023-07-05 DIAGNOSIS — N760 Acute vaginitis: Principal | ICD-10-CM

## 2023-07-05 DIAGNOSIS — R3129 Other microscopic hematuria: Principal | ICD-10-CM

## 2023-07-05 DIAGNOSIS — B9689 Other specified bacterial agents as the cause of diseases classified elsewhere: Principal | ICD-10-CM

## 2023-07-05 DIAGNOSIS — R300 Dysuria: Principal | ICD-10-CM

## 2023-07-05 LAB — COMPREHENSIVE METABOLIC PANEL
ALBUMIN: 3.8 g/dL (ref 3.4–5.0)
ALKALINE PHOSPHATASE: 47 U/L (ref 46–116)
ALT (SGPT): 39 U/L (ref 10–49)
ANION GAP: 8 mmol/L (ref 3–11)
AST (SGOT): 31 U/L (ref <34)
BILIRUBIN TOTAL: 0.4 mg/dL (ref 0.3–1.2)
BLOOD UREA NITROGEN: 11 mg/dL (ref 9–23)
BUN / CREAT RATIO: 18
CALCIUM: 10.1 mg/dL (ref 8.7–10.4)
CHLORIDE: 106 mmol/L (ref 98–107)
CO2: 29 mmol/L (ref 20.0–31.0)
CREATININE: 0.62 mg/dL (ref 0.55–1.02)
EGFR CKD-EPI (2021) FEMALE: >90 mL/min/1.73m2 (ref >=60)
GLUCOSE RANDOM: 92 mg/dL (ref 70–99)
POTASSIUM: 4.5 mmol/L (ref 3.5–5.1)
PROTEIN TOTAL: 7.3 g/dL (ref 5.7–8.2)
SODIUM: 143 mmol/L (ref 136–145)

## 2023-07-05 LAB — SYPHILIS SCREEN: SYPHILIS AB IGG/IGM SCREEN: NONREACTIVE

## 2023-07-05 LAB — HEPATITIS B SURFACE ANTIGEN: HEPATITIS B SURFACE ANTIGEN: NONREACTIVE

## 2023-07-05 LAB — URINALYSIS WITH MICROSCOPY WITH CULTURE REFLEX PERFORMABLE
BACTERIA: NONE SEEN /HPF
BILIRUBIN UA: NEGATIVE
GLUCOSE UA: NEGATIVE
HYPHAL YEAST: NONE SEEN /HPF
KETONES UA: NEGATIVE
LEUKOCYTE ESTERASE UA: NEGATIVE
NITRITE UA: NEGATIVE
PH UA: 6 (ref 5.0–9.0)
PROTEIN UA: NEGATIVE
RBC UA: 29 /HPF — ABNORMAL HIGH (ref 0–3)
SPECIFIC GRAVITY UA: 1.025 (ref 1.005–1.030)
SQUAMOUS EPITHELIAL: 3 /HPF (ref 0–5)
UROBILINOGEN UA: <2
WBC CLUMPS: NONE SEEN /HPF
WBC UA: <1 /HPF (ref 0–2)
YEAST: NONE SEEN /HPF

## 2023-07-05 LAB — T4, FREE: FREE T4: 1.36 ng/dL (ref 0.89–1.76)

## 2023-07-05 LAB — HEPATITIS C ANTIBODY: HEPATITIS C ANTIBODY: NONREACTIVE

## 2023-07-05 LAB — TSH: THYROID STIMULATING HORMONE: 0.78 u[IU]/mL (ref 0.550–4.780)

## 2023-07-05 LAB — HIV ANTIGEN/ANTIBODY COMBO: HIV ANTIGEN/ANTIBODY COMBO: NONREACTIVE

## 2023-07-05 MED ORDER — METRONIDAZOLE 500 MG TABLET
ORAL_TABLET | Freq: Two times a day (BID) | ORAL | 0 refills | 7.00000 days | Status: CP
Start: 2023-07-05 — End: 2023-07-12

## 2023-07-05 NOTE — Unmapped (Signed)
 Name:  Natalie [name]  DOB: [birth year]  Today's Date: 07/10/2023  Age:  53 y.o.    Assessment/Plan:      Natalie was seen today for bacterial vaginosis.    Diagnoses and all orders for this visit:    Acute vaginitis  -     Chlamydia/Gonorrhoeae NAA; Future  -     Vaginitis Molecular Panel; Future  -     Chlamydia/Gonorrhoeae NAA  -     Vaginitis Molecular Panel    Low TSH level    Hypernatremia  -     Comprehensive Metabolic Panel; Future  -     Comprehensive Metabolic Panel    Encounter for screening examination for sexually transmitted infection  -     HIV Antigen/Antibody Combo; Future  -     Syphilis Screen; Future  -     Hepatitis C antibody; Future  -     Hepatitis B surface antigen; Future  -     Chlamydia/Gonorrhoeae NAA; Future  -     Vaginitis Molecular Panel; Future  -     Chlamydia/Gonorrhoeae NAA  -     Vaginitis Molecular Panel  -     HIV Antigen/Antibody Combo  -     Syphilis Screen  -     Hepatitis C antibody  -     Hepatitis B surface antigen    Dysuria  -     Urinalysis with Microscopy with Culture Reflex; Future  -     Urinalysis with Microscopy with Culture Reflex  -     Urine Culture; Future  -     Urine Culture    Subclinical hyperthyroidism  -     TSH; Future  -     T4, Free; Future  -     TSH  -     T4, Free    Hot flashes  -     TSH; Future  -     T4, Free; Future  -     TSH  -     T4, Free    Encounter for screening for human immunodeficiency virus (HIV)  -     HIV Antigen/Antibody Combo; Future  -     HIV Antigen/Antibody Combo    Other problems related to lifestyle  -     Hepatitis C antibody; Future  -     Hepatitis B surface antigen; Future  -     Hepatitis C antibody  -     Hepatitis B surface antigen    Need for measles-mumps-rubella (MMR) vaccine  -     MMR vaccine subcutaneous; Future  -     MMR vaccine subcutaneous    Microhematuria  -     Urine Culture; Future  -     Urine Culture    Bacterial vaginitis  -     metroNIDAZOLE (FLAGYL) 500 MG tablet; Take 1 tablet (500 mg total) by mouth two (2) times a day for 7 days.      Assessment & Plan  Vaginal Discharge with Odor  Cream-colored discharge with odor, burning at urination end. Differential: bacterial vaginosis, trichomoniasis, yeast infection. Boric acid ineffective.    Breast Mass  4 mm asymmetry in left outer breast on mammogram. History of fibrocystic changes and benign lumps. Follow-up ultrasound scheduled.  - Proceed with scheduled breast ultrasound.    Hypertension  Blood pressure 146/88 mmHg. Not on anti-hypertensives. Pain may contribute to elevation.  - Monitor blood pressure closely.  -  Consider lifestyle modifications.    Menopausal Symptoms  Severe hot flashes, sweating, chills. Ran out of hormone replacement therapy. Unable to contact gynecologist.  - Advise to contact gynecologist for hormone therapy management.    Hypothyroidism  Previously treated. Current symptoms include increased hot flashes. Thyroid function tests needed.  - Order thyroid function tests.    General Health Maintenance  Due for measles vaccination. Previous low potassium, on supplementation. Increased cholesterol, slightly elevated sodium.  - Administer measles vaccine.  - Continue potassium supplementation, monitor levels.  - Monitor cholesterol, ensure adherence to rosuvastatin.  - Monitor sodium levels.    Diagnosis and plan along with any newly prescribed medication(s) were discussed in detail with this patient today. The patient verbalized understanding and agreed with the plan without language barriers or behavioral barriers to understanding unless otherwise noted.    Subjective:      HPI: Natalie [name] is a 53 y.o. female is here for    Chief Complaint   Patient presents with    Bacterial Vaginosis     History of Present Illness  Natalie [name] is a 53 year old female who presents with vaginal discharge and odor.    She has been experiencing a cream-colored vaginal discharge with a strong odor for the past three weeks. There is no associated itching, but there is a burning sensation at the end of urination. She used boric acid treatment about two weeks ago without improvement. She is sexually active with a steady partner and does not use protection. Her partner does not have any symptoms.    She has a history of elevated blood pressure readings, with today's measurement at 146/88 mmHg. Previous readings include 150/87 on June 2nd and 140/89 on May 7th. She is not currently on any medication for hypertension.    She has been experiencing severe hot flashes and sweating, followed by periods of feeling cold. She ran out of her hormone medications, which include estradiol and progesterone.    She takes potassium supplements daily at a dose of 20 milliequivalents due to previously low potassium levels, which she is currently maintaining with supplementation.    She has a history of hypothyroidism treated in the past while living in [place], which resolved after a couple of years of treatment.    She reports a history of breast lumps as a teenager, with multiple lumps on both sides. Recently, a mammogram showed a 4mm asymmetry in the left breast, prompting a follow-up ultrasound.    Her previous blood work showed she does not have immunity to measles. She is not currently around anyone who is sick or immunocompromised.    She experiences frequent urination and mild abdominal pain, particularly in the lower abdomen. No constipation and regular bowel movements are noted.         Past Medical/Surgical History:     Past Medical History[1]  Past Surgical History[2]    Family History:     Family History[3]    Social History:     Social History[4]    Allergies:     Buprenorphine hcl, Pregabalin, Buprenorphine, Cephalexin monohydrate, Doxycycline, Oxycodone hcl, Adhesive tape-silicones, Doxycycline hyclate (bulk), Keflex [cephalexin], Opioids - morphine analogues, and Oxycodone-acetaminophen    Current Medications:     Current Medications[5]    ROS: Review of Systems    Vital Signs:     Wt Readings from Last 3 Encounters:   07/05/23 74.8 kg (165 lb)   06/27/23 77.1 kg (170 lb)  06/01/23 77.1 kg (170 lb)     Temp Readings from Last 3 Encounters:   07/05/23 36.2 ??C (97.2 ??F) (Temporal)   05/18/23 36.6 ??C (97.8 ??F) (Temporal)   04/13/23 36.3 ??C (97.4 ??F) (Temporal)     BP Readings from Last 3 Encounters:   07/05/23 138/82   06/27/23 152/87   06/01/23 140/89     Pulse Readings from Last 3 Encounters:   07/05/23 70   06/27/23 55   06/01/23 69     Body mass index is 31.18 kg/m??.    Objective:      Physical Exam  Vitals and nursing note reviewed. Exam conducted with a chaperone present.   Constitutional:       Appearance: Normal appearance. She is well-developed. She is not diaphoretic.     Eyes:      General: No scleral icterus.     Conjunctiva/sclera: Conjunctivae normal.     Neck:      Thyroid: No thyromegaly.      Vascular: No JVD.     Cardiovascular:      Rate and Rhythm: Normal rate and regular rhythm.      Heart sounds: Normal heart sounds. No murmur heard.  Pulmonary:      Effort: Pulmonary effort is normal. No respiratory distress.      Breath sounds: Normal breath sounds. No wheezing.   Abdominal:      Palpations: Abdomen is soft. There is no mass.      Tenderness: There is abdominal tenderness (in suprapubc region / lower abdomen.). There is no right CVA tenderness, left CVA tenderness, guarding or rebound.   Genitourinary:     Exam position: Lithotomy position.      Vagina: Vaginal discharge (mucoid vaginal discharge.) present.      Cervix: Normal.      Uterus: Normal. Not tender.       Adnexa: Right adnexa normal and left adnexa normal.     Neurological:      Mental Status: She is alert.     Psychiatric:         Behavior: Behavior normal.         Thought Content: Thought content normal.         Judgment: Judgment normal.           Labs:     Results for orders placed or performed in visit on 07/05/23   Chlamydia/Gonorrhoeae NAA    Specimen: Cervix; Swab   Result Value Ref Range    Chlamydia trachomatis, NAA Negative Negative    Gonorrhoeae NAA Negative Negative    CT/GC Specimen Type Swab     CT/GC Specimen Source Cervix    Vaginitis Molecular Panel    Specimen: Clinician-collected Vaginal Swab   Result Value Ref Range    Bacterial Vaginitis Positive (A) Negative    Candida group NAAT Negative Negative    Candida glabrata NAAT Negative Negative    Trichomonas NAAT Negative Negative   Urine Culture    Specimen: Clean Catch; Urine   Result Value Ref Range    Urine Culture, Comprehensive Normal Urogenital Flora    TSH   Result Value Ref Range    TSH 0.780 0.550 - 4.780 uIU/mL   T4, Free   Result Value Ref Range    Free T4 1.36 0.89 - 1.76 ng/dL   Comprehensive Metabolic Panel   Result Value Ref Range    Sodium 143 136 - 145 mmol/L    Potassium 4.5 3.5 -  5.1 mmol/L    Chloride 106 98 - 107 mmol/L    CO2 29.0 20.0 - 31.0 mmol/L    Anion Gap 8 3 - 11 mmol/L    BUN 11 9 - 23 mg/dL    Creatinine 9.37 9.44 - 1.02 mg/dL    BUN/Creatinine Ratio 18     eGFR CKD-EPI (2021) Female >90 >=60 mL/min/1.49m2    Glucose 92 70 - 99 mg/dL    Calcium 89.8 8.7 - 89.5 mg/dL    Albumin 3.8 3.4 - 5.0 g/dL    Total Protein 7.3 5.7 - 8.2 g/dL    Total Bilirubin 0.4 0.3 - 1.2 mg/dL    AST 31 <65 U/L    ALT 39 10 - 49 U/L    Alkaline Phosphatase 47 46 - 116 U/L   HIV Antigen/Antibody Combo   Result Value Ref Range    HIV Antigen/Antibody Combo Nonreactive Nonreactive   Syphilis Screen   Result Value Ref Range    Syphilis Antibody IgG/IgM Screen Nonreactive Nonreactive   Hepatitis C antibody   Result Value Ref Range    Hepatitis C Ab Nonreactive Nonreactive   Hepatitis B surface antigen   Result Value Ref Range    Hep B Surface Ag Nonreactive Nonreactive   Urinalysis with Microscopy with Culture Reflex   Result Value Ref Range    Color, UA Yellow     Clarity, UA Clear     Specific Gravity, UA 1.025 1.005 - 1.030    pH, UA 6.0 5.0 - 9.0    Leukocyte Esterase, UA Negative Negative    Nitrite, UA Negative Negative    Protein, UA Negative Negative    Glucose, UA Negative Negative, Trace    Ketones, UA Negative Negative    Urobilinogen, UA <2.0 mg/dL <7.9 mg/dL    Bilirubin, UA Negative Negative    Blood, UA Large (A) Negative    RBC, UA 29 (H) 0 - 3 /HPF    WBC, UA <1 0 - 2 /HPF    Squam Epithel, UA 3 0 - 5 /HPF    Bacteria, UA None Seen None Seen /HPF    WBC Clumps None Seen None Seen /HPF    Hyphal Yeast None Seen None Seen /HPF    Yeast, UA None Seen None Seen /HPF       Montel Bobo, MD  07/10/2023                   [1]   Past Medical History:  Diagnosis Date    Allergic conjunctivitis 11/23/2018    Allergic rhinitis     Anemia 1986    Arthritis     Breast cyst 85 & 87 both breast    Removed    Breast mass     Chondromalacia of left knee     Chondromalacia of right knee     Chronic kidney disease     Cluster headache     Depressive disorder     Dizziness     Fibrocystic breast     GERD (gastroesophageal reflux disease)     Headache, tension-type     Hematuria     History of kidney stones     History of transfusion     Spinal Fusion    Hyperlipidemia 1999    Hypothyroidism     Kidney stone     Lumbar disc disease 1998    She was injured at work at the age of 7 and then had  disk surgery 2 years later     Menopause ovarian failure     Menopause, premature     Migraine with aura     Neuromuscular disorder    1999    Obesity (BMI 30-39.9) 11/23/2018    Ovarian cyst     Plantar fasciitis     Pseudotumor cerebri     Rash due to allergy 11/23/2018    Scoliosis     Sickle cell trait (CMS-HCC)     Sleep apnea     Urinary tract infection     Vaginitis    [2]   Past Surgical History:  Procedure Laterality Date    BACK SURGERY  2001    BREAST BIOPSY Bilateral     When patient was 16 & 17 Both Negative    BREAST LUMPECTOMY  85 &86    removed    EPIDURAL BLOCK INJECTION      HYSTERECTOMY  2008    PR REMOVAL OF TONSILS,13+ Y/O Bilateral 10/10/2019    Procedure: TONSILLECTOMY;  Surgeon: Norleen Rome Miss, MD; Location: OR [facility];  Service: ENT    SALPINGOOPHORECTOMY Bilateral 2007    SPINAL FUSION      SPINE SURGERY  2001    TOTAL VAGINAL HYSTERECTOMY  01/04/2006    Uterine Prolapse-Dr. Almarie Broach OP Note    TRIGGER POINT INJECTION      TUBAL LIGATION  1995   [3]   Family History  Problem Relation Age of Onset    Hypertension Mother     Heart disease Mother     Arthritis Mother     Depression Mother     Drug abuse Mother     Mental illness Mother     Ulcers Mother     COPD Mother     Allergic rhinitis Mother     Heart attack Father     Mental illness Father     Asthma Father     Cancer Sister         Lymphoma    Heart attack Maternal Grandmother     Heart disease Maternal Grandmother     Hypertension Maternal Grandmother     Thyroid disease Maternal Grandmother     Stroke Paternal Grandfather     Depression Son     Drug abuse Son     Mental illness Son     COPD Maternal Aunt     Migraines Maternal Aunt     Heart attack Cousin 75    Breast cancer Cousin 32    Thyroid disease Other     Cancer Sister     COPD Maternal Aunt     Migraines Maternal Aunt     Depression Son     Drug abuse Son     Mental illness Son     Alcohol abuse Neg Hx    [4]   Social History  Socioeconomic History    Marital status: Single     Spouse name: None    Number of children: 2    Years of education: 15    Highest education level: None   Occupational History     Employer: NOT EMPLOYED    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   Tobacco Use    Smoking status: Never     Passive exposure: Past    Smokeless tobacco: Never   Vaping Use    Vaping status: Never Used   Substance and Sexual Activity    Alcohol use: Yes  Comment: Only drink for really special occasions    Drug use: Never    Sexual activity: Yes     Partners: Male     Birth control/protection: Post-menopausal, Surgical     Comment: steady boyfriend   Social History Narrative    Marital Status - Single     Children - Son(s) [2]    Pets - Dog (1) Turtle (1) Household - Lives with sons and grandson Hennie)     Occupation - Disabled since 2002    Armed forces technical officer - Denies      Diet - Regular    Exercise/Sports - Walking     Hobbies - Conservation officer, historic buildings - [employer] Degree Psychologist, forensic)     Country of Origin - [place]                       Social Drivers of Psychologist, prison and probation services Strain: Low Risk  (06/12/2023)    Overall Financial Resource Strain (CARDIA)     Difficulty of Paying Living Expenses: Not hard at all   Food Insecurity: No Food Insecurity (06/12/2023)    Hunger Vital Sign     Worried About Running Out of Food in the Last Year: Never true     Ran Out of Food in the Last Year: Never true   Recent Concern: Food Insecurity - Food Insecurity Present (05/18/2023)    Hunger Vital Sign     Worried About Running Out of Food in the Last Year: Never true     Ran Out of Food in the Last Year: Sometimes true   Transportation Needs: No Transportation Needs (06/12/2023)    PRAPARE - Transportation     Lack of Transportation (Medical): No     Lack of Transportation (Non-Medical): No   Stress: Stress Concern Present (06/16/2022)    [employer] of Occupational Health - Occupational Stress Questionnaire     Feeling of Stress : To some extent   Housing: Low Risk  (06/12/2023)    Housing     Within the past 12 months, have you ever stayed: outside, in a car, in a tent, in an overnight shelter, or temporarily in someone else's home (i.e. couch-surfing)?: No     Are you worried about losing your housing?: No   [5]   Current Outpatient Medications   Medication Sig Dispense Refill    albuterol HFA 90 mcg/actuation inhaler Inhale 2 puffs every six (6) hours as needed for wheezing or shortness of breath. 54 g 0    azelastine (ASTELIN) 137 mcg (0.1 %) nasal spray 2 sprays into each nostril two (2) times a day. 30 mL 5    azelastine (OPTIVAR) 0.05 % ophthalmic solution Administer 2 drops to both eyes daily as needed. 6 mL 5    baclofen (LIORESAL) 20 MG tablet 10 - 20 mg three times a day as needed for spasms. 90 tablet 2    diclofenac sodium 20 mg/gram /actuation(2 %) sopm Apply 40 mg topically two (2) times a day. 112 g 0    empty container Misc Use as directed to dispose of Xolair syringes 1 each 2    EPINEPHrine (EPIPEN) 0.3 mg/0.3 mL injection Inject 0.3 mL (0.3 mg total) into the muscle once as needed for anaphylaxis (Difficulty breathing, throat closing, etc) for up to 1 dose. 1 each 12    famotidine (PEPCID) 20 MG tablet Take 1 tablet (20 mg total) by mouth two (  2) times a day as needed for heartburn. 60 tablet 1    gabapentin (NEURONTIN) 800 MG tablet Take 1 tablet (800 mg total) by mouth two (2) times a day. 180 tablet 0    hydroxychloroquine (PLAQUENIL) 200 mg tablet Take 1 tablet (200 mg total) by mouth two (2) times a day. 60 tablet 12    ipratropium (ATROVENT) 21 mcg (0.03 %) nasal spray 2 sprays into each nostril four (4) times a day. 30 mL 1    lidocaine (LIDODERM) 5 % patch Place 1 patch on the skin daily. Apply up to 1 patches at a time.  Use for up to 12 hours/day. 30 patch 2    naloxone (NARCAN) 4 mg nasal spray 1 spray into alternating nostrils once as needed (opioid overdose.). One spray in either nostril once for known/suspected opioid overdose. May repeat every 2-3 minutes in alternating nostril til EMS arrives 2 each 2    omalizumab (XOLAIR) 150 mg/mL syringe Inject 2 mL (300 mg total) under the skin every fourteen (14) days. 4 mL 11    oxyCODONE-acetaminophen (PERCOCET) 10-325 mg per tablet Take 1 tablet by mouth every four (4) hours as needed for pain. Max 6 tabs/day. Fill on or after: 06/21/23. Brand name only due to allergy. 180 tablet 0    [START ON 07/21/2023] oxyCODONE-acetaminophen (PERCOCET) 10-325 mg per tablet Take 1 tablet by mouth every four (4) hours as needed for pain. Max 4 tabs/day. Fill on or after: 07/21/23. Brand name only due to allergy. 120 tablet 0    oxyCODONE-acetaminophen (PERCOCET) 10-325 mg per tablet Take 1 tablet by mouth every four (4) hours as needed for pain. Max 6 tabs/day. Fill on or after: 06/29/23 (This is the remainder for the partial fill patient received in May). 63 tablet 0    pantoprazole (PROTONIX) 40 MG tablet TAKE 1 TABLET(40 MG) BY MOUTH DAILY 30 MINUTES BEFORE BREAKFAST AS NEEDED 90 tablet 2    potassium chloride 20 mEq TbER TAKE 1 TABLET BY MOUTH DAILY 90 tablet 2    rosuvastatin (CRESTOR) 10 MG tablet Take 1 tablet (10 mg total) by mouth nightly. 90 tablet 3    UBRELVY 50 mg tablet TAKE 1 TABLET BY MOUTH FOR HEADACHE. MAY REPEAT IN 2 HOURS IF NEEDED. LIMIT 2 TABLETS PER DAY AND NO MORE THAN 4 TABLETS PER WEEK 16 tablet 3    XHANCE 93 mcg/actuation AerB 1 spray into each nostril two (2) times a day as needed. 16 mL 11    estradiol (ESTRACE) 1 MG tablet Take 1 tablet (1 mg total) by mouth daily. 30 tablet 11    metroNIDAZOLE (FLAGYL) 500 MG tablet Take 1 tablet (500 mg total) by mouth two (2) times a day for 7 days. 14 tablet 0    progesterone (PROMETRIUM) 100 MG capsule Take 1 capsule (100 mg total) by mouth daily. 90 capsule prn     No current facility-administered medications for this visit.

## 2023-07-07 MED ORDER — PROGESTERONE MICRONIZED 100 MG CAPSULE
ORAL_CAPSULE | Freq: Every day | ORAL | prn refills | 90.00000 days | Status: CP
Start: 2023-07-07 — End: 2023-07-07

## 2023-07-07 MED ORDER — ESTRADIOL 1 MG TABLET
ORAL_TABLET | Freq: Every day | ORAL | 11 refills | 30.00000 days | Status: CP
Start: 2023-07-07 — End: 2024-07-06

## 2023-07-07 NOTE — Unmapped (Signed)
 [facility] Refill Coordination Note    Natalie [name], : [birth year]  Phone: There are no phone numbers on file.      All above HIPAA information was verified with patient.         07/06/2023     3:14 PM   Specialty Rx Medication Refill Questionnaire   Which Medications would you like refilled and shipped? Xolair   Please list all current allergies: Listed in chart   Have you missed any doses in the last 30 days? No   Have you had any changes to your medication(s) since your last refill? No   How many days remaining of each medication do you have at home? 0   If receiving an injectable medication, next injection date is 07/15/2023   Have you experienced any side effects in the last 30 days? No   Please enter the full address (street address, city, state, zip code) where you would like your medication(s) to be delivered to. [address] , [place] [address]   Please specify on which day you would like your medication(s) to arrive. Note: if you need your medication(s) within 3 days, please call the pharmacy to schedule your order at [phone]  07/10/2023   Has your insurance changed since your last refill? No   Would you like a pharmacist to call you to discuss your medication(s)? No   Do you require a signature for your package? (Note: if we are billing Medicare Part B or your order contains a controlled substance, we will require a signature) No   I have been provided my out of pocket cost for my medication and approve the pharmacy to charge the amount to my credit card on file. Yes         Completed refill call assessment today to schedule patient's medication shipment from the [facility] [phone]).  All relevant notes have been reviewed.       Confirmed patient received a Conservation officer, historic buildings and a Surveyor, mining with first shipment. The patient will receive a drug information handout for each medication shipped and additional FDA Medication Guides as required.         REFERRAL TO PHARMACIST     Referral to the pharmacist: Not needed      [facility]     Shipping address confirmed in Epic.     Delivery Scheduled: Yes, Expected medication delivery date: 07/11/23.     Medication will be delivered via Same Day Courier to the prescription address in Epic WAM.    Stephen Ehrlich   [facility] Specialty Technician

## 2023-07-08 DIAGNOSIS — M7918 Myalgia, other site: Principal | ICD-10-CM

## 2023-07-08 DIAGNOSIS — M5417 Radiculopathy, lumbosacral region: Principal | ICD-10-CM

## 2023-07-08 MED ORDER — GABAPENTIN 800 MG TABLET
ORAL_TABLET | Freq: Two times a day (BID) | ORAL | 0 refills | 0.00000 days
Start: 2023-07-08 — End: ?

## 2023-07-08 NOTE — Unmapped (Signed)
 Rec'd refill request through interface from patient's pharmacy for Gabapentin 800mg   Last OV: 06/01/2023  Next OV: 07/26/2023  Please consider refill if appropriate

## 2023-07-11 ENCOUNTER — Ambulatory Visit
Admit: 2023-07-11 | Discharge: 2023-07-12 | Payer: Medicare (Managed Care) | Attending: Sports Medicine | Primary: Sports Medicine

## 2023-07-11 DIAGNOSIS — M170 Bilateral primary osteoarthritis of knee: Principal | ICD-10-CM

## 2023-07-11 MED ORDER — GABAPENTIN 800 MG TABLET
ORAL_TABLET | Freq: Two times a day (BID) | ORAL | 0 refills | 90.00000 days
Start: 2023-07-11 — End: ?

## 2023-07-11 MED ADMIN — sodium hyaluronate (viscosup) (GELSYN-3) 16.8 mg/2 mL injection 16.8 mg: 16.8 mg | INTRA_ARTICULAR | @ 19:00:00 | Stop: 2023-07-11

## 2023-07-11 MED FILL — XOLAIR 150 MG/ML SUBCUTANEOUS SYRINGE: SUBCUTANEOUS | 28 days supply | Qty: 4 | Fill #8

## 2023-07-11 NOTE — Unmapped (Signed)
 PDMD reviewed, 90 days supply was dispensed on 6/05. Pt has appointment on 7/1, further refills will be addressed at next visit.

## 2023-07-11 NOTE — Unmapped (Signed)
 Lg Joint Inj: bilateral knee on 07/11/2023 3:00 PM  Indications: pain  Details: 22 G needle  Laterality: bilateral  Location: knee  Medications (Right): 16.8 mg sodium hyaluronate (viscosup) 16.8 mg/2 mL  Medications (Left): 16.8 mg sodium hyaluronate (viscosup) 16.8 mg/2 mL  Procedure, treatment alternatives, risks and benefits explained, specific risks discussed. Consent was given by the patient. Immediately prior to procedure a time out was called to verify the correct patient, procedure, equipment, support staff and site/side marked as required. Patient was prepped and draped in the usual sterile fashion.     Provider Attestation: The information documented by members of my medical care team was reviewed and verified for accuracy by me.

## 2023-07-13 DIAGNOSIS — M170 Bilateral primary osteoarthritis of knee: Principal | ICD-10-CM

## 2023-07-13 MED ORDER — DICLOFENAC 20 MG/GRAM/ACTUATION (2 %) TOPICAL SOLN METERED-DOSE PUMP
0 refills | 0.00000 days
Start: 2023-07-13 — End: ?

## 2023-07-17 NOTE — Unmapped (Signed)
 Left knee severe medial compartment osteoarthritis, Right knee osteoarthritis; Gelsyn injection #1/3 (Dr. Venice) (left > right, last injection series 12/2022, 03/2022, 07/2021 with good relief) (improvement with first injections)     We discussed the Gelsyn injection series for the bilateral knee and the patient desired to proceed.     Procedure: After sterile prep, 2ml Gelsyn was injected into the left knee. The patient tolerated the procedure well.     Procedure: After sterile prep, 2ml Gelsyn was injected into the right knee. The patient tolerated the procedure well.     Follow-up in 1 week for the second bilateral knee Gelsyn injection.

## 2023-07-18 ENCOUNTER — Ambulatory Visit: Admit: 2023-07-18 | Discharge: 2023-07-19 | Payer: Medicare (Managed Care)

## 2023-07-18 ENCOUNTER — Ambulatory Visit
Admit: 2023-07-18 | Discharge: 2023-07-19 | Payer: Medicare (Managed Care) | Attending: Psychologist | Primary: Psychologist

## 2023-07-18 DIAGNOSIS — M170 Bilateral primary osteoarthritis of knee: Principal | ICD-10-CM

## 2023-07-18 DIAGNOSIS — F4321 Adjustment disorder with depressed mood: Principal | ICD-10-CM

## 2023-07-18 DIAGNOSIS — F419 Anxiety disorder, unspecified: Principal | ICD-10-CM

## 2023-07-18 DIAGNOSIS — F1190 Opioid use, unspecified, uncomplicated: Principal | ICD-10-CM

## 2023-07-18 MED ADMIN — sodium hyaluronate (viscosup) (GELSYN-3) 16.8 mg/2 mL injection 16.8 mg: 16.8 mg | INTRA_ARTICULAR | @ 18:00:00 | Stop: 2023-07-18

## 2023-07-18 NOTE — Unmapped (Signed)
 [facility]   Confidential Psychological Therapy Session    Patient Name: Natalie [name]  Medical Record Number: [id number]  Date of Service: July 18, 2023  Attending Psychologist: Greig Holland, PhD  CPT Procedure Code: 09162 for 60 minutes of face to face counseling    IN PERSON VISIT    REFERRING PHYSICIAN: Prentice Cost, MD    CHIEF COMPLAINT AND REASON FOR VISIT:  COM follow up evaluation/pain coping skills; CBT/ACT for pain; grief    SUBJECTIVE / HISTORY OF PRESENT ILLNESS: [name] is a very pleasant 53 y.o.  female with chronic lower back pain, chronic headaches, and myofascial pain. Her pain started in 1999 in her lower back.  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  She had a spinal fusion in 2001 at the L5-S1 level.  She has had bilateral shoulder pain, neck pain, lower back pain, and b/l knees.  She also described a radiculopathy b/l that goes from her back to her bilateral buttocks and down to her toes.      Pt initially established with Dr. Cost in 08/2016 and then was was first evaluated by me in 07/2017. Last follow up with me was 06/01/2023. Pt participates actively. Reports good adherence to COM. Still working but pain has been worse, in her head and neck and also in her lower extremities (which is newer). Pt processes the change and worries about new pain. Reviewed ARC and pacing and how to overlearn relaxation practices and mindfulness practice. Pt processes stressors related to custody hearing of niece. Worked out in her favor ultimately but the whole ideal was stressful. Spent extra time practicing mindful breathing and body scan.     OBJECTIVE / MENTAL STATUS:    Appearance:   Appears stated age and Clean/Neat   Motor:  No abnormal movements   Speech/Language:   Normal rate, volume, tone, fluency   Mood:  Irritable   Affect:  Blunted   Thought process:  Logical, linear, clear, coherent, goal directed   Thought content:    Denies SI, HI, self harm, delusions, obsessions, paranoid ideation, or ideas of reference   Perceptual disturbances:    Denies auditory and visual hallucinations, behavior not concerning for response to internal stimuli   Orientation:  Oriented to person, place, time, and general circumstances   Attention:  Able to fully attend without fluctuations in consciousness   Concentration:  Able to fully concentrate and attend   Memory:  Immediate, short-term, long-term, and recall grossly intact    Fund of knowledge:   Consistent with level of education and development   Insight:    Fair   Judgment:   Intact   Impulse Control:  Intact       DIAGNOSTIC IMPRESSION:   Anxiety NOS  Chronic continuou use of opiates  Grief    ASSESSMENT:   [name] is a very pleasant 53 y.o.  female from [place], [place] with chronic lower back pain, chronic headaches, and myofascial pain. Her pain started in 1999 in her lower back.  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  She had a spinal fusion in 2001 at the L5-S1 level.  She has had bilateral shoulder pain, neck pain, lower back pain, and b/l knees.  She also described a radiculopathy b/l that goes from her back to her bilateral buttocks and down to her toes.  The patient  is currently considered to be high risk due to prior nonadherence, but appropriate with a behavioral adherence plan in place.  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 She continues to participate actively in psychotherapy.    PLAN:   (1) COM - high risk but remains appropriate with a behavioral adherence plan also in place.    -Patient MUST meet with pain psychology/me once a month.   -No additional overuse of opioids will be tolerated.    -Patient should always bring her medication to clinic for pill counts.  -Patient was informed she has had numerous infractions with respect to her pain contract over the years, and that no further infractions will be tolerated.    If any additional pain contract or behavioral adherence contract infractions occur, I recommend stopping opioids.    Pt doing well with adherence, per our last few visits.     (2) Pain coping skills- addressed compliance, as well as continued skills practice    (3) follow-in 1-2 months

## 2023-07-19 DIAGNOSIS — M170 Bilateral primary osteoarthritis of knee: Principal | ICD-10-CM

## 2023-07-19 MED ORDER — DICLOFENAC 20 MG/GRAM/ACTUATION (2 %) TOPICAL SOLN METERED-DOSE PUMP
0 refills | 0.00000 days
Start: 2023-07-19 — End: ?

## 2023-07-20 MED ORDER — DICLOFENAC 20 MG/GRAM/ACTUATION (2 %) TOPICAL SOLN METERED-DOSE PUMP
0 refills | 0.00000 days
Start: 2023-07-20 — End: ?

## 2023-07-21 DIAGNOSIS — M170 Bilateral primary osteoarthritis of knee: Principal | ICD-10-CM

## 2023-07-21 MED ORDER — OXYCODONE-ACETAMINOPHEN 10 MG-325 MG TABLET
ORAL_TABLET | ORAL | 0 refills | 20.00000 days | Status: CN | PRN
Start: 2023-07-21 — End: ?

## 2023-07-21 MED ORDER — DICLOFENAC 20 MG/GRAM/ACTUATION (2 %) TOPICAL SOLN METERED-DOSE PUMP
0 refills | 0.00000 days
Start: 2023-07-21 — End: ?

## 2023-07-22 MED ORDER — DICLOFENAC 20 MG/GRAM/ACTUATION (2 %) TOPICAL SOLN METERED-DOSE PUMP
Freq: Two times a day (BID) | TOPICAL | 0 refills | 0.00000 days
Start: 2023-07-22 — End: ?

## 2023-07-23 NOTE — Unmapped (Signed)
 Left knee severe medial compartment osteoarthritis, Right knee osteoarthritis; Gelsyn injection #3/3 (Dr. Venice) (left > right, last injection series 12/2022, 03/2022, 07/2021 with good relief) (improvement with first injections)     We discussed the Gelsyn injection series for the bilateral knee and the patient desired to proceed.     Procedure: After sterile prep, 2ml Gelsyn was injected into the left knee. The patient tolerated the procedure well.     Procedure: After sterile prep, 2ml Gelsyn was injected into the right knee. The patient tolerated the procedure well.     Follow-up in 6 weeks as needed.

## 2023-07-25 ENCOUNTER — Ambulatory Visit: Admit: 2023-07-25 | Discharge: 2023-07-26 | Payer: Medicare (Managed Care)

## 2023-07-25 DIAGNOSIS — M170 Bilateral primary osteoarthritis of knee: Principal | ICD-10-CM

## 2023-07-25 MED ADMIN — sodium hyaluronate (viscosup) (GELSYN-3) 16.8 mg/2 mL injection 16.8 mg: 16.8 mg | INTRA_ARTICULAR | @ 18:00:00 | Stop: 2023-07-25

## 2023-07-26 ENCOUNTER — Ambulatory Visit: Admit: 2023-07-26 | Payer: Medicare (Managed Care) | Attending: Anesthesiology | Primary: Anesthesiology

## 2023-07-26 DIAGNOSIS — M791 Myalgia, unspecified site: Principal | ICD-10-CM

## 2023-07-26 DIAGNOSIS — M542 Cervicalgia: Principal | ICD-10-CM

## 2023-07-26 DIAGNOSIS — M47812 Spondylosis without myelopathy or radiculopathy, cervical region: Principal | ICD-10-CM

## 2023-07-26 NOTE — Unmapped (Signed)
 Ref request for PT for neck and mid back.  Sent to preferred location.    Orders Placed This Encounter   Procedures    Ambulatory referral to Physical Therapy     Standing Status:   Future     Expected Date:   07/26/2023     Expiration Date:   07/25/2024     Referral Priority:   Routine     Referral Type:   Physical Therapy     Number of Visits Requested:   1

## 2023-07-31 MED ORDER — GABAPENTIN 800 MG TABLET
ORAL_TABLET | Freq: Two times a day (BID) | ORAL | 0 refills | 90.00000 days
Start: 2023-07-31 — End: ?

## 2023-07-31 MED ORDER — OXYCODONE-ACETAMINOPHEN 10 MG-325 MG TABLET
ORAL_TABLET | ORAL | 0 refills | 20.00000 days | PRN
Start: 2023-07-31 — End: ?

## 2023-07-31 MED ORDER — BACLOFEN 20 MG TABLET
ORAL_TABLET | 2 refills | 0.00000 days
Start: 2023-07-31 — End: ?

## 2023-08-01 ENCOUNTER — Ambulatory Visit
Admit: 2023-08-01 | Discharge: 2023-08-02 | Payer: Medicare (Managed Care) | Attending: Anesthesiology | Primary: Anesthesiology

## 2023-08-01 DIAGNOSIS — M47812 Spondylosis without myelopathy or radiculopathy, cervical region: Principal | ICD-10-CM

## 2023-08-01 DIAGNOSIS — G894 Chronic pain syndrome: Principal | ICD-10-CM

## 2023-08-01 DIAGNOSIS — M7918 Myalgia, other site: Principal | ICD-10-CM

## 2023-08-01 DIAGNOSIS — M5417 Radiculopathy, lumbosacral region: Principal | ICD-10-CM

## 2023-08-01 DIAGNOSIS — M47816 Spondylosis without myelopathy or radiculopathy, lumbar region: Principal | ICD-10-CM

## 2023-08-01 DIAGNOSIS — M170 Bilateral primary osteoarthritis of knee: Principal | ICD-10-CM

## 2023-08-01 MED ORDER — GABAPENTIN 800 MG TABLET
ORAL_TABLET | Freq: Two times a day (BID) | ORAL | 0 refills | 90.00000 days | Status: CP
Start: 2023-08-01 — End: ?

## 2023-08-01 MED ORDER — OXYCODONE-ACETAMINOPHEN 10 MG-325 MG TABLET
ORAL_TABLET | ORAL | 0 refills | 30.00000 days | Status: CP | PRN
Start: 2023-08-01 — End: ?

## 2023-08-01 MED ORDER — BACLOFEN 20 MG TABLET
ORAL_TABLET | ORAL | 2 refills | 0.00000 days | Status: CP
Start: 2023-08-01 — End: ?

## 2023-08-01 MED ORDER — MILNACIPRAN 12.5 MG TABLET
ORAL_TABLET | ORAL | 2 refills | 0.00000 days | Status: CP
Start: 2023-08-01 — End: ?

## 2023-08-01 NOTE — Unmapped (Addendum)
 Today we did the following   -It was good to see you    -If we have prescribed you a medication, be prepared that your insurance company may require additional paperwork (even for generic medication), typically called a prior authorization.  This sometimes can delay the prescription, but know that our office is very efficient at taking care of these.  If it is significantly delayed, you may call the pharmacy for updates, or call the office.    -Unfortunately many opioid medications have been on shortage lately.  It is helpful to contact your pharmacy up to a week before your fill date to verify if they will have medication (sometimes they know, sometimes they may not).  If we need to send a prescription to a different pharmacy, please let us  know what pharmacy has it in stock and provide us  the name, address and phone number so we can transfer a prescription.  The earlier you are able to do this, decreases the chance that you run out of medication.  It has been challenging, but we will do our best to accommodate, but many times it takes us  48-72 hours to respond to phone calls or mychart messages, we typically are unable to make last minute changes.    Narcan  is now available Over the Counter (OTC), meaning you can pick it up at many pharmacies without a prescription.  The cost is around $45.      -adjust percocet back to every 4 hours, up to 6 tablets per day, Rx's updated    -Continue other medications, refilled as needed.    -Start savella , instructions given to increase over 2 weeks    -Start PT    -Caudal ESI in early August    -EMG/NCS to check on the left foot    -Back to clinic in 3 months    -Please plan to arrive to all appointments at least 30 minutes prior to your scheduled appointment time. Failure to do so may delay your visit.    *Bring opioid medication with you to each visit    MyChart Messages:  Please use MyChart for non-urgent symptoms or matters such as general questions, non-urgent prescription refills, or non-urgent scheduling issues. For your safety and best  care please DO NOT use MyChart messages to report emergent or urgent symptoms as messages are only checked during regular business hours.  These messages are checked by the nurses during normal business hours 8:30 am-4:30 pm Monday-Friday every 24-48 hours and are for non-urgent, non-emergent concerns. You may be asked to return for a follow up visit if it is deemed your questions are best handled in the clinic setting.  Please call our nurse line below for more time sensitive issues. If our office is closed you can contact the hospital operator to reach the on call provider available for urgent/emergent issues only.     DO:  1. Read the medication guide  2. Take medication exactly as prescribed  3. Store medication in a safe place, away from children and pets, or anyone who might  steal or misuse it. Store it in a safe and secure place (lock box). Also secure any  unfilled prescriptions.   4. Bring unused medication to the next visit so it can be disposed of safely  5. Tell the prescribing provider if you experience any side effects from the medication. You may also report side effects to the FDA at 1-800-FDA-1088.  6. DO check your states rules regarding opioids and  driving.    DO NOT:  1. Give your medications to others  2. Take medication unless it was prescribed to you  3. Take more medication than prescribed   4. Stop taking medication without first talking to your provider  5. Break, chew, crush, dissolve, or inject medications. If you cannot swallow your medications, talk to your provider.   6. Drink alcohol or use illegal drugs while taking this medication    BENZODIAZEPINES: Taking opioid pain medications in combination with benzodiazepines can increase the risk of overdose and death.     -Because of the high volume of calls we receive and the high demand for our clinical services, we are occupied all day providing care for patients in the clinic. This leaves little time to respond to phone calls, and we are generally unable to discuss patient care advice over the telephone. If you are experiencing a medication side effect or complication, you can call and let us  know, but we will typically not make a medication substitution or change over the telephone.     We are generally unable to respond acutely to a flare up of pain, as this is quite common in our patients and needs to be dealt with as part of the long term management plan. Please make an appointment with us  if you wish to discuss a matter in any detail. Should you still need to call, please do so at [phone].     Please do not call for early medication refills.     Thank you for choosing  Pain Management. It was a pleasure to see you in clinic today. Please contact us  with any questions or concerns at [phone].     Prentice Cost, MD    08/01/2023  Anesthesiologist and Pain Management Provider  [facility]      -If you or someone you know is having a mental health emergency, you can dial [phone] (instead of 911), for the mental health emergency line.      What are common side effects from pain medications you may be taking?    Do not drive or do anything with responsibility until you know you are tolerating a new medication.    Many pain type medications can be sedating, especially in combination with others.  They can also decrease your breathing especially in combination with other medications.  If a medication is not effective for you, talk to us  about weaning off (do not stop abruptly on your own), so we do not just continue it.    Only take medications as prescribed.  If a medication is prescribed as needed, it means you can use it as you need it and do not have to take it every day.  Do not ever take more than what is prescribed to you for any medication.  Not only could this be dangerous, but if you do not take it as prescribed we may have to discontinue it due to safety concerns.  If you feel like your medications are not controlling your medications, do not adjust the medication on your own.  Please discuss at your next clinic visit and we can determine a plan.    Opioids/Narcotics (Examples: oxycodone , hydrocodone , tramadol, morphine, hydromorphone , fentanyl  patch)  ?? Constipation-It is important to treat this early and effectively - your doctor can help!  ?? Itching  ?? Nausea  ?? Dizziness or lightheadedness  ?? Drowsiness or sedation  ?? Decreased breathing   ?? Addiction  Sexual difficulties or  loss of interest.  Mood changes.  Cognitive impairment (not thinking or responding normally).     Anticonvulsants (Examples: Gabapentin , Pregabalin, Carbamazepine, topiramate )  ?? Dizziness or lightheadedness  ?? Drowsiness or sedation  ?? Nausea  ?? Leg swelling     Antidepressants (Examples: duloxetine,amitriptyline, nortriptyline , venlafaxine )  New warning in 2019 that these medications in combination with opioids can cause respiratory depression.  ?? Dizziness or lightheadedness  ?? Drowsiness or sedation  ?? Constipation  ?? Urinary retention  ?? Dry mouth  ?? Nausea  ?? Difficulty falling asleep     NSAIDS (Examples: Ibuprofen , naproxen , meloxicam, celecoxib )  -The FDA put out guidelines in 2015 that NSAIDs should not be used regularly due to safety concerns.  OK to use a few times a month, or for 1-2 weeks for a flare of pain.  If you take NSAIDs everyday, we should discuss this and stop it due to safety.  ?? Bleeding  ?? Stomach ulcers  ?? Kidney problems  -Heart Attack  -Stroke     Muscle relaxants (Examples: cyclobenzaprine , tizanidine , baclofen )  ?? Dizziness or lightheadedness  ?? Drowsiness or sedation  ?? Nausea  ?? Low blood pressure  ?? Dry mouth     Many pain medications can lead to a rare, potentially dangerous condition called serotonin syndrome. Symptoms can include tremors, agitation, fever, muscle rigidity/contractions, diarrhea, excessive sweating, palpitations and high blood pressure. If these symptoms arise, please contact us  or report to the emergency room for further management.      Your pharmacist can answer any questions you may have in regard to all your medications or possible interactions.    OPIOID(NARCOTIC) MEDICATIONS    You must obtain you narcotic/opioid medications from the [facility] and inform your Pain physician if you receive pain medications (narcotics) from another doctor or emergency room.    It is important that you secure your pain medications to keep them safe from children. You must not share, sell or trade your pain medications with anyone. You must not take anyone else's pain medications. You should not take more pain medications than prescribed without asking your pain doctor first.    You will not receive extra refills for lost or stolen medications, regardless of the circumstances. You may be charged with driving under the influence (DUI) if you drive while on pain medications.    Pain medications can cause sleepiness, dangerously slow breathing and even death. If you or a family member notice a change in your breathing, notify the pain physician immediately.  If you stop breathing or are found unconscious, your family should immediately call 911. Use of pain medicines with alcohol can increase the risk of these complications.    If you stop taking pain medications too quickly, you may have side effects such as sweating, shaking, loss of temper, nausea, diarrhea and increased pain.    A narcotic overdose is the misuse or overuse (doubling up on pills or taking doses too close together) of a narcotic drug. A narcotic overdose can make you pass out and stop breathing. This is especially true if you drink alcohol or use valium-like medications in addition to the opioid pain medication.  If you stop breathing or are found unconscious, your family should immediately call 911.    You may experience constipation while taking these medications.  You may need to use a stool softener and laxative.    Opiates (narcotics) are medicines used to relieve moderate to severe pain. They may be  used for a short time for pain, such as after surgery. Or they may be used for long-term pain. They don't cure a health problem. But they help you manage the pain.  Opiates relieve pain by changing the way your body feels pain and the way you feel about pain. Opiates are powerful medicines. You may need to take extra steps to stay safe. Taking too much (overdose) of an opiate can cause death.    SPECIAL RISK:  Taking your medication in combination with a benzodiazepine medication (nerve pill such as Valium, Xanax, Klonopin, Ativan) can increase the risk to you of overdose and death.      What to know about taking this medicine  Your body gets used to opiates if you take them all the time. You could have withdrawal symptoms when you stop taking them. Symptoms include nausea, sweating, chills, diarrhea, and shaking. But you can avoid these symptoms if you slowly stop taking the medicine as your doctor tells you to.  You have a small chance of addiction if you take opiates as prescribed. Your risk is a bit higher if you have abused drugs in the past.  Some opiates have acetaminophen  in them. Check the labels on all the other medicines you take. This includes over-the-counter drugs. Many medicines have acetaminophen . Do not take others with acetaminophen  in them unless your doctor has told you to. Taking too much acetaminophen  can be harmful. Talk to your doctor or pharmacist if you have questions about this.  Be sure you know how to safely get rid of any leftover medicine. Talk to your doctor or pharmacist about how to do this. Ask for written instructions.    When should you call for help?  Call 911 anytime you think you may need emergency care. For example, call if:  You have trouble breathing.  You have swelling of your face, lips, tongue, or throat.  You have signs of an overdose. These include:  Cold, clammy skin.  Confusion.  Severe nervousness or restlessness.  Severe dizziness, drowsiness, or weakness.  Slow breathing.  Seizures.    Get rid of unused opioid pain medication safely - If you have opioid pills, liquid or patches that you are not going to use, get rid of them right away. Check the [place]  Drug Enforcement Administration (DEA) website (http://www.tucker.net/) for approved locations near you.

## 2023-08-01 NOTE — Unmapped (Signed)
 Department of Anesthesiology  [facility]  [address], [address]  [place], [place] [address]  [phone]    Chronic Pain Follow Up Note  1. Chronic pain syndrome    2. Spondylosis of lumbar region without myelopathy or radiculopathy    3. Spondylosis without myelopathy or radiculopathy, cervical region    4. Primary osteoarthritis of both knees    5. Lumbosacral radiculopathy    6. Myofascial pain syndrome      Assessment and Plan  Attending: [name] is a 53 y.o. female who is being followed at the [facility] with a history of chronic pain localized to bilateral shoulder, knee as well as cervical thoracic and lumbar pain with history of L5-S1 spinal fusion and the pain is associated with bilateral radiculopathy. She was first seen in clinic in August 2018. She has a history of migraines and pseudotumor cerebri, followed by neurology and ophthalmology for treatment. Gets caudal ESI and TPIs to good effect. Compliance concerns in 2021 that led to additional oversight and then transitioning to Belbuca  in 10/2019. However, she did not tolerate belbuca  or nucynta  and was transitioned back to Percocet but with closer oversight and assistance from pain psychology. Between medications and procedures, she has been able to continue working. She now teaches water aerobics.   She's had a history of neck pain and previously responded to TPI, but more recently underwent cervical RFA.  Cervical RFA in March 2025 complicated by dural puncture and worsened lower extremity symptoms following, continuing to monitor.  Also has bilateral knee pain and follows with orthopedics and undergoes injections with steroids and HA.     July 2025  Last seen in May, returns today for follow up s/p bilateral cervical RFA on 04/13/23 c/b post-procedural neuritis and dural puncture. Patient endorses similar symptoms as prior visit, but some improvement in soreness at injection site. She reports persistent numbness and neuropathic pain with numbness and tingling from her buttocks to her toes bilaterally with L>R. Reiterated that nerve injury and neuropathy were rare but known risks of procedure, and neuritis should settle down with time. Patient also complaining of neck pain causing her to wake up in the middle of the night. LE motor strength is grossly intact bilaterally, however, she has significant discomfort on the bottom of her left foot describing sensation of a standing on a pebble at multiple locations on the balls of her foot and increased numbness at her L heel, which she states has changed her gait. Patient recently completed steroid and HA gel injections with ortho for her knees, which she states has slightly helped, but still has worse pain on L side. Otherwise, her chronic pain remains overall unchanged, and her requirements for percocet remains typically around 6/day on days that she works and 4-5/day on non-working days. Of note, her pill count showed higher usage than 4/day (33 in clinic and reported 24 in pill container). Will return to previous dosage #180 for next refill after she finishes current refill as she hasn't tolerated weaning to her previous quantity. Patient also to return to physical therapy next week. Chronic pain also managed with Gabapentin  400/400/800 on days that she does not work (on working days takes 1600 mg at night w/o major side effects d/t concerns of sleepiness if taken during the day), baclofen  20 mg TID, ice packs on neck, OTC lidocaine  patches, and voltaren  gel. Patient reports not taking nortriptyline  since before march d/t side  effect of drowsiness affecting her during her work. Discussed with patient trying a new SNRI to help with neuropathic pain. Patient states she was unable to tolerate cymbalta or effexor  in the past, but was agreeable to try savella .  We discussed EMG/NCS for the lower extremity symptoms. She also inquires about repeat caudal ESI since it has been some time and there could be an aspect of worsened lumbar radiculopathy leading to her symptoms.      Post-procedural neuritis   - Continue gabapentin  400/400/800 mg daily    - Continue oxycodone  prescription to allow dosing every four hours as needed for pain management given the increase in pain and to allow her to continue to be active, working, and taking care of her nephew.    - Encourage use of heat, ice, and stretching exercises to alleviate symptoms.    - Continue lidocaine  patches, Voltaren  gel    Lumbar fusion    Lumbar fusion relevant to chronic pain management. No indication of acute issues related to lumbar fusion at this time.    Cervicalgia  Unchanged; s/p cervical RFA with likely neuritis and some effects related to dural puncture.     Myofascial pain syndrome  She states she benefited after the most recent cervical TPIs, currently getting about 60% relief, FMAS score improved  - Patient to restart PT  next week  - Continue Baclofen  20 mg TID, max dose  - Repeat cervical TPIs PRN after 04/2023; FMAS 10 at last visit, 16 day of procedure    Lumbosacral radiculopathy    Having sustained relief following caudal ESI in August 2023, though worsened recently. Would like to prioritize neck pain currently, though wants to repeat caudal ESI in the future.  - Continue Gabapentin   - Continue Voltaren  gel  - Nortriptyline  50 mg at bedtime per Neurology (patient not taking)  - Start Savella  12.5 mg qam for 1-2 weeks, then increase to 25 mg qam  -EMG/NCS  -Repeat Caudal ESI, see order documentation below    Chronic pain syndrome   - Return to Percocet 10-325 mg to allow q4h PRN d/t increasing pain requirements   - UDS up to date, order random today  - Patient has been exercising with benefit.  - Continue current pain management regimen including baclofen , Voltaren  gel, Percocet as needed, and start savella     - Monitor and adjust medications as necessary based on symptom control and side effects.       Last pain medication agreement on file and signed on 12/27/22  Last urine toxicology: 12/27/22.  Appropriate.  NCCSRS database was reviewed 08/01/23 and is appropriate:    COMM: 3 points on 06/01/23  Last Opioid Change: Percocet 10-325 decreased from 5x daily PRN to 4x daily PRN 05/2016, 01/2018-increase to #132, several changes in Fall 2021 due to compliance concerns, 11/2019-back to percocet #90, 05/2020-back to #120, 04/2023-Up to #180, 06/2023-back to #120, 07/2023-back to #180  Previous Compliance Issues: Did not bring medications 10/2016, no show 04/2017, overtaking 06/2017, short on 09/27/17, lost Rx 10/2017, 01/2018-too late to be seen, 04/2019- 4 days short, but reports some tabs in med box at home. 07/2019 - short, states that she left 28 pills at home and 5 in her car. 08/2019- short, says she overtook during acute increase in pain in setting of acute sinusitis/strep throat. 09/2019- short, says she is unsure why her pill count is low and denies overuse. 10/2019 Did not bring Belbuca  to appointment, 11/28/2019 - Did not bring Percocet for  pill count, 06/2021 - Did not bring Percocet  Oral Morphine Equivalents: 60-90  On a Benzodiazepine: no   Naloxone  last Ordered:  04/2023  NCCSRS database was reviewed 08/01/23.      Return for proc early August, clinic before 10/1 MD or NP.    Future Considerations:  Repeat caudal ESI  Ensure continued f/u with Dr. Garrel      PROCEDURE CHECKLIST FOR ORDERING PROVIDERS    Please select responses to the procedure-relevant sections of the SmartPhrase below and delete non-relevant sections. Prioritize your decision making and only order one injection/procedure type at a time.    Natalie [name] is a 53 y.o. female   Diagnosis: lumbar radiculopathy  Procedure Ordered/Requested (Include Side and Specific Level): Caudal ESI  Does the condition cause moderate to severe impairment in function? PLEASE NOTE: Pain scale of 1-3 is considered low and does NOT support ???moderate??? or ???severe??? pain level: Yes.  If low pain score, does pain impact function: N/A-pain scores in moderate/severe range, does impact function..   Has the condition been ongoing for greater than 3 months: Yes  Have conservative options been tried for at least 6 weeks within the past 12 months, including diagnosis-specific physical therapy or home exercise program for at least 6 weeks and oral medications as tolerated: Yes  Date(s) Last Course of PT Was Completed: 2025  HEP (please include frequency and duration of sessions): Yes. Daily.  Also teaches water aerobics herself  Medications Tried/Failed: See below, extensive  Anticoagulants: None.  Clearance to Stop Obtained and Instructions Reviewed with Patient? NA  Labs required?: No.  Driver: Yes.  PIV: No.  Risk category (consider age, hx bleeding issues, anticoagulants, liver dx, kidney dx): intermediate  Expected timing: Late July, early August-had oral steroids March, Knee CSI 06/27/23  Length of procedure: 30 minutes  Pertinent imaging findings: MRI 01/24/23  Impression   1.  L3-4: Mild progression of lateral recess narrowing due to disc bulging and facet arthropathy. Mild bilateral foraminal narrowing, unchanged.       2.  L4-5: Disc and facet degeneration resulting in lateral recess narrowing and moderate to severe bilateral foraminal narrowing, similar to the prior.       3.  L5-S1: Status post posterior fusion without canal or foraminal stenosis.     Contrast allergy: No., will contrast be used: Yes.  Medication to be injected: Local anesthetic and Steroid  Consent Obtained: No      If Ordering Fluoroscopically Guided Epidural Steroid Injection:  Is the pain predominantly radicular? Yes  Is this a repeat injection order? Yes  If repeat injection order, did the patient receive 50% of sustained improvement in pain relief and/or improvement in function measured from baseline for at least three months after last injection? Yes: 90% relief for 12months  The patient has NOT had more than 4 epidural injections in a rolling twelve (12) month period.  Have ESI's been ongoing for 12 months? Yes.. Although she is not getting them regularly, last was in 2023, if we repeat more frequently will make PCP aware    If NO to any of the above, do not order or provide additional rationale.    Do NOT order epidural steroid injections for axial back/neck pain.          Requested Prescriptions     Signed Prescriptions Disp Refills    oxyCODONE -acetaminophen  (PERCOCET) 10-325 mg per tablet 180 tablet 0     Sig: Take 1 tablet by mouth every four (  4) hours as needed for pain. Max 6 tabs/day. Fill on or after: 10/09/23    oxyCODONE -acetaminophen  (PERCOCET) 10-325 mg per tablet 180 tablet 0     Sig: Take 1 tablet by mouth every four (4) hours as needed for pain. Max 6 tabs/day. Fill on or after: 08/10/23 (date moved up d/t frequency change)    oxyCODONE -acetaminophen  (PERCOCET) 10-325 mg per tablet 180 tablet 0     Sig: Take 1 tablet by mouth every four (4) hours as needed for pain. Max 6 tabs/day. Fill on or after: 09/09/23    gabapentin  (NEURONTIN ) 800 MG tablet 180 tablet 0     Sig: Take 1 tablet (800 mg total) by mouth two (2) times a day.    baclofen  (LIORESAL ) 20 MG tablet 90 tablet 2     Sig: 10 - 20 mg three times a day as needed for spasms.    milnacipran  (SAVELLA ) 12.5 mg Tab tablet 60 tablet 2     Sig: Take 1 tablet in the morning for 1-2 weeks.  If tolerated, increase to 2 tablets in the morning and continue.     Orders Placed This Encounter   Procedures    NJX DX/THER SBST INTRLMNR LMBR/SAC W/IMG GDN (37676)     Kema Santaella  [facility] proc or spine center  Late July or early August  Caudal ESI  Driver yes  PIV no     Standing Status:   Future     Expected Date:   08/01/2023     Expiration Date:   07/31/2024    Drug Screen, [facility], Urine     Patient's Current Medications:   oxycodone      Release to patient:   Immediate [1]    EMG     Standing Status:   Future     Expected Date:   08/01/2023     Expiration Date:   07/31/2024     Reason for Exam::   long term neuropathic pain, worse since procedure 03/2023     Performing Location?:   [place]       HPI  Natalie [name] is a 53 y.o. being followed at [facility] for complaint of chronic pain localized to her neck, upper back, lower back, bilateral hips and knees.      Patient was last seen in May. Since last visit, the patient has followed with Ortho and Fam Med.     Today, patient presents reporting unchanged continued post procedural neuritis, numbness and neuropathic pain to the level of bottom of her left foot. She denies new or red flag symptoms. She states she did not notice any benefit with steroid pack. She notes some effect with gabapentin  400/400/800 mg, unable to tolerate increased dose in the past. Significant benefit with increased percocet due to increased pain. She also notes benefit from OTC lidocaine  (unable to fill 5% lidocaine  patch Rx), and voltaren  gel. She endorses benefit on current medication regimen without any associated adverse side effects.     Patients current medication regimen:  - Percocet 10-325 mg q4-6h prn   - Gabapentin  400/400/800 mg (on days she is not working); 1600 mg at bedtime on days she is working  - Voltaren  gel-using regularly  - Lidocaine  patch OTC  - Baclofen  20mg  TID  - Nortriptyline  50 mg at bedtime per neurology (not taking, d/t drowsiness)     Current view: Showing all answers           [facility] Return Patient Questionnaire       Question 08/01/2023  7:14 AM EDT - Natalie [name] by Patient    What is the reason for your visit? Post procedure assessment    Date of onset of your pain: 04/13/2023    Please rate your pain at its WORST in the past month. 9    Please rate your pain at its LEAST in the past month. 8    Please rate your pain as it is RIGHT NOW. 8    Please rate your pain on AVERAGE in the past month. 8    Please circle the location of your pain. Please select the words that describe your pain. Burning     Pulling     Pulsing     Sore     Stabbing Tender Throbbing    How often do you have pain? All the time    When is your pain the worst?       Which of the following have been negatively affected by your pain? Enjoyment of life     General activity     Mood     Normal work     Recreational activities     Relationships with people     Sleep     Standing    Since your last visit:     Have you had any of the following? Primary Care Visit    Do you have any new pain you would like to discuss with your doctor? Yes    How has your pain changed? Stayed the same    Are you currently taking any blood-thinners or anticoagulants? No    If you are on Pain Medication - Are you having any of the following? Change in Sleep Patterns     I am stable on my current medication regime    If you have had a procedure since your last visit, how much pain relief was obtained? 0%    If you have had a procedure since your last visit, were there any complications?       General: Snoring     Night Sweats     Difficulty Sleeping    Cardiovascular:       Gastrointestinal - (Intestinal): Gastric Reflux    Skin: Itching    Endocrine (Hormonal System):       Musculoskeletal System - (Muscles, Joints and Coverings): Joint Aches/Swelling     Spasms/Spasticity/Cramps     Back pain    Neurologic: Headaches/Migraines     Numbness/Tingling    Psychiatric:             [facility] Patient-Entered [facility]pi Selection Questionnaire       Question 08/01/2023  7:16 AM EDT - Natalie [name] by Patient    What is the primary reason for your visit? Back Pain    Your back pain Has lasted for 3 months or more    When did you first notice your back pain? More than 1 month ago    How often do you feel back pain? Constantly    Since you first noticed your back pain, how has it changed? Unchanged    Where is your back pain located? Buttocks     Lower back - above the waist     Lower back - below the waist     Upper back    How would you describe your back pain? Aching     Burning     Shooting  Stabbing    Where does your back pain spread? Left foot     Left knee     Left thigh     Right foot    On a scale of 0 to 10 (10 being the worst), how strong is your back pain? 9    Your back pain is... the same all the time    What makes your back pain worse? Bending     Certain positions     Lying down     Sitting     Standing     Twisting    When do you feel stiffness in your back? All day    Are you experiencing any of the following symptoms with your back pain?     Abdominal pain No    Bladder incontinence No    Bowel incontinence No    Chest pain No    Fever No    Headaches Yes    Leg pain Yes    Numbness Yes    Numbness around the anus No    Painful urination No    Paralysis No    Pelvic pain No    Pins and needles Yes    Tingling Yes    Weakness No    Weight loss No    Do any of the following apply to you?               Allergies  Allergies   Allergen Reactions    Buprenorphine  Hcl Itching and Swelling    Pregabalin Swelling    Buprenorphine      Cephalexin  Monohydrate     Doxycycline      Oxycodone  Hcl     Adhesive Tape-Silicones Itching     Band-aids ok.    Doxycycline  Hyclate (Bulk) Nausea And Vomiting     GI Upset    Keflex  [Cephalexin ] Rash    Opioids - Morphine Analogues Itching    Oxycodone -Acetaminophen  Itching and Nausea And Vomiting     GI Upset- GENERIC ONLY- able to take the brand name Percocets     Home Medications    Current Outpatient Medications   Medication Sig Dispense Refill    albuterol  HFA 90 mcg/actuation inhaler Inhale 2 puffs every six (6) hours as needed for wheezing or shortness of breath. 54 g 0    azelastine  (ASTELIN ) 137 mcg (0.1 %) nasal spray 2 sprays into each nostril two (2) times a day. 30 mL 5    azelastine  (OPTIVAR ) 0.05 % ophthalmic solution Administer 2 drops to both eyes daily as needed. 6 mL 5    diclofenac  sodium 20 mg/gram /actuation(2 %) sopm Apply 40 mg topically two (2) times a day. 112 g 0 empty container Misc Use as directed to dispose of Xolair  syringes 1 each 2    EPINEPHrine  (EPIPEN ) 0.3 mg/0.3 mL injection Inject 0.3 mL (0.3 mg total) into the muscle once as needed for anaphylaxis (Difficulty breathing, throat closing, etc) for up to 1 dose. 1 each 12    estradiol  (ESTRACE ) 1 MG tablet Take 1 tablet (1 mg total) by mouth daily. 30 tablet 11    famotidine  (PEPCID ) 20 MG tablet Take 1 tablet (20 mg total) by mouth two (2) times a day as needed for heartburn. 60 tablet 1    hydroxychloroquine  (PLAQUENIL ) 200 mg tablet Take 1 tablet (200 mg total) by mouth two (2) times a day. 60 tablet 12    ipratropium (ATROVENT ) 21 mcg (0.03 %) nasal spray 2 sprays  into each nostril four (4) times a day. 30 mL 1    lidocaine  (LIDODERM ) 5 % patch Place 1 patch on the skin daily. Apply up to 1 patches at a time.  Use for up to 12 hours/day. 30 patch 2    naloxone  (NARCAN ) 4 mg nasal spray 1 spray into alternating nostrils once as needed (opioid overdose.). One spray in either nostril once for known/suspected opioid overdose. May repeat every 2-3 minutes in alternating nostril til EMS arrives 2 each 2    omalizumab  (XOLAIR ) 150 mg/mL syringe Inject 2 mL (300 mg total) under the skin every fourteen (14) days. 4 mL 11    pantoprazole  (PROTONIX ) 40 MG tablet TAKE 1 TABLET(40 MG) BY MOUTH DAILY 30 MINUTES BEFORE BREAKFAST AS NEEDED 90 tablet 2    potassium chloride  20 mEq TbER TAKE 1 TABLET BY MOUTH DAILY 90 tablet 2    progesterone  (PROMETRIUM ) 100 MG capsule Take 1 capsule (100 mg total) by mouth daily. 90 capsule prn    rosuvastatin  (CRESTOR ) 10 MG tablet Take 1 tablet (10 mg total) by mouth nightly. 90 tablet 3    UBRELVY  50 mg tablet TAKE 1 TABLET BY MOUTH FOR HEADACHE. MAY REPEAT IN 2 HOURS IF NEEDED. LIMIT 2 TABLETS PER DAY AND NO MORE THAN 4 TABLETS PER WEEK 16 tablet 3    XHANCE  93 mcg/actuation AerB 1 spray into each nostril two (2) times a day as needed. 16 mL 11    baclofen  (LIORESAL ) 20 MG tablet 10 - 20 mg three times a day as needed for spasms. 90 tablet 2    gabapentin  (NEURONTIN ) 800 MG tablet Take 1 tablet (800 mg total) by mouth two (2) times a day. 180 tablet 0    milnacipran  (SAVELLA ) 12.5 mg Tab tablet Take 1 tablet in the morning for 1-2 weeks.  If tolerated, increase to 2 tablets in the morning and continue. 60 tablet 2    [START ON 10/09/2023] oxyCODONE -acetaminophen  (PERCOCET) 10-325 mg per tablet Take 1 tablet by mouth every four (4) hours as needed for pain. Max 6 tabs/day. Fill on or after: 10/09/23 180 tablet 0    oxyCODONE -acetaminophen  (PERCOCET) 10-325 mg per tablet Take 1 tablet by mouth every four (4) hours as needed for pain. Max 6 tabs/day. Fill on or after: 08/10/23 (date moved up d/t frequency change) 180 tablet 0    [START ON 09/09/2023] oxyCODONE -acetaminophen  (PERCOCET) 10-325 mg per tablet Take 1 tablet by mouth every four (4) hours as needed for pain. Max 6 tabs/day. Fill on or after: 09/09/23 180 tablet 0     No current facility-administered medications for this visit.     Previous Medication Trials:  NSAIDS- celebrex , ibuprofen , naproxen ,   Antidepressants-   Anticonvulsant- gabapentin , pregabalin,   Muscle relaxants- baclofen , flexeril , valium, skelaxin, robaxin, tizanidine   Topicals-voltaren  gel, lidoderm ,   Short-acting opiates-hydromorphone , percocet, oxycodone , tramadol, ultracet, vicodin, nucynta  IR  Long-acting opiates- fentanyl , morphine, methadone, oxycontin . Sedation with long acting opioids. Belbuca  (side effects)  Anxiolytics-   Other-     Previous Interventions  PT/Aquatic therapy/TENS/Meidcation/Epidural injections/Nerve Block/ Other injections/Surgery/Psychological Counseling  Before our clinic:  - L3-5 MBBB 11/2015 with good benefit; repeat did not help per patient  - B/l trochanteric injections 10/2014  - TPI 11/2015 upper neck to lower back  - B/l knee injections 2017  - Caudal epidural steroid injection (unsure of timing) with improvement in radiculopathy symptoms  Previous tests include MRI, XRAYs, CT scan and NCS/EMG  [facility]  Caudal ESI 10/20/16, 01/2017, 07/14/17, 10/27/17, 03/22/18, 11/16/18, 05/25/19,11/26/19  Knee injection-[place] 10/22/16  TPI 10/28/16, 01/2017, 07/14/17, 08/11/17, 10/27/17, 03/22/18, 12/17/19  Lumbar L2, L3, L4 MBB - >80% relief x2 in October 2022  Lumbar L2, L3, L4 RFA - 12/10/20 - initial flare-likely neuritis. Can assess efficacy longer term.  PT-07/2018  Bilateral knee IA CSI-Sports med 02/16/19  Trigger point injections 03/05/20 and 04/21/20, 05/30/20, 07/09/20, 08/21/20, 09/25/20, 11/05/20  Caudal ESI 03/19/20, 08/11/20, 08/27/21, repeat TBD  TENS unit-ordered 05/2020  12/24/21-bilateral GTB CST ortho  11/26/21-ortho knee CSI  11/19/21-ortho knee CSI  Cervical RFA 05/11/22  TPI 08/31/22  PT referral 08/2022  Cervical RFA 04/13/23-dural puncture, neuritis  Ortho 07/25/23-HA series bilateral knees,   Ortho 06/27/23-bilateral knee CSI,     Imaging:  Cervical XRAY 11/20/21  Impression   Mild multilevel degenerative disc disease, greatest at C4-C5 and C5-6 and unchanged from the prior CT on 08/08/2020. Anterolisthesis of C4 on C5 is also unchanged.     Review Of Systems  See questionnaires    Physical Exam    VITALS:   Vitals:    08/01/23 0825   BP: 128/84   Pulse: 77   Resp: 16   SpO2: 98%     Wt Readings from Last 3 Encounters:   08/01/23 77.1 kg (170 lb)   07/25/23 73.9 kg (163 lb)   07/18/23 74 kg (163 lb 2.3 oz)       GENERAL:  The patient is well developed, well-nourished, and appears to be in no apparent distress.   HEAD/NECK:    Normocephalic/atraumatic. clear sclera, pupils not pinpoint  CV:  Warm and well perfused.   LUNGS:   Normal work of breathing, no supplemental O2  EXTREMITIES:  No clubbing, cyanosis noted.  NEUROLOGIC:    The patient is alert and oriented, speech fluent, normal language.   MUSCULOSKELETAL:    Motor function  preserved.   GAIT:  The patient rises from a seated position with no difficulty and ambulates with nonantalgic gait without the assistance of a walking aid.   SKIN:   No obvious rashes, lesions, or erythema.  PSY:   Appropriate affect. No overt pain behaviors. No evidence of psychomotor retardation or agitation, no signs of intoxication.     Medical Decision Making    Problems Addressed:  1 or more chronic illness w/ exacerbation, progression, or side effects of treatment (moderate)    Amount/Complexity:  Reviewed external records: PCP 07/05/23, Pain psych 07/18/23, Ortho 07/25/23-HA series bilateral knees, Ortho 06/27/23-bilateral knee CSI,  and [place] PDMP (today on day of visit)  Reviewed results: Labs 05/20/23-PLT WNL, 07/05/23-Creatinine WNL, LFTs WNL  Ordered tests: UDs and EMG  Assessment requiring independent historian: None  Discussion of management/test results with medical professional: None  Independent interpretation of test: None    Risk  Moderate: Prescription Drug Management, Minor surgery w/ risk factors, Elective Major Surgery w/o risk factors, Diagnosis/treatment significantly limited by social determinants of health          We are delivering comprehensive, continuous, longitudinal care for this patient with chronic pain.     I saw and evaluated the patient, participating in the key portions of the service.  I reviewed the resident's note and agree with the findings and plan.  Prentice Cost, MD

## 2023-08-01 NOTE — Unmapped (Signed)
 Medication:oxyCODONE -acetaminophen  (PERCOCET) 10-325 mg per tablet   DPH:Ujxz 1 tablet by mouth every four (4) hours as needed for pain.   Quantity on RX: #120  Filled on:07/19/2023  Pill count today: #29 - PT STATED  I FORGOT THE REST OF MY PILLS @ [facility], #24.

## 2023-08-01 NOTE — Unmapped (Signed)
 Reviewed with patient, their AVS can be viewed through their My Chart.  Pt voiced understanding

## 2023-08-02 MED ORDER — DICLOFENAC 20 MG/GRAM/ACTUATION (2 %) TOPICAL SOLN METERED-DOSE PUMP
Freq: Two times a day (BID) | TOPICAL | 0 refills | 0.00000 days | Status: CP
Start: 2023-08-02 — End: ?

## 2023-08-04 NOTE — Unmapped (Signed)
 [facility] Clinical Assessment & Refill Coordination Note    Natalie [name], DOB: 1970-02-09  Phone: There are no phone numbers on file.    All above HIPAA information was verified with patient.     Was a Nurse, learning disability used for this call? No    Specialty Medication(s):   Inflammatory Disorders: Xolair      Current Medications[1]     Changes to medications: [name] reports starting the following medications: Savella  12.5mg     Medication list has been reviewed and updated in Epic: Yes    Allergies[2]    Changes to allergies: No    Allergies have been reviewed and updated in Epic: Yes    SPECIALTY MEDICATION ADHERENCE     Xolair  150 mg/ml: 0 doses of medicine on hand     Medication Adherence    Patient reported X missed doses in the last month: 0  Specialty Medication: Xolair  150 mg/mL - inject 300mg  Q14d  Patient is on additional specialty medications: No  Patient is on more than two specialty medications: No  Any gaps in refill history greater than 2 weeks in the last 3 months: no  Demonstrates understanding of importance of adherence: yes  Informant: patient          Specialty medication(s) dose(s) confirmed: Regimen is correct and unchanged.     Are there any concerns with adherence? No    Adherence counseling provided? Not needed    CLINICAL MANAGEMENT AND INTERVENTION      Clinical Benefit Assessment:    Do you feel the medicine is effective or helping your condition? Yes    Clinical Benefit counseling provided? Not needed    Adverse Effects Assessment:    Are you experiencing any side effects? No    Are you experiencing difficulty administering your medicine? No    Quality of Life Assessment:    Quality of Life    Rheumatology  Oncology  Dermatology  Cystic Fibrosis          How many days over the past month did your chronic idiopathic urticaria  keep you from your normal activities? For example, brushing your teeth or getting up in the morning. 0    Have you discussed this with your provider? Not needed    Acute Infection Status:    Acute infections noted within Epic:  No active infections    Patient reported infection: None    Therapy Appropriateness:    Is therapy appropriate based on current medication list, adverse reactions, adherence, clinical benefit and progress toward achieving therapeutic goals? Yes, therapy is appropriate and should be continued     Clinical Intervention:    Was an intervention completed as part of this clinical assessment? No    DISEASE/MEDICATION-SPECIFIC INFORMATION      For patients on injectable medications: Patient currently has 0 doses left.  Next injection is scheduled for 08/05/23.    Chronic Inflammatory Diseases: Have you experienced any flares in the last month? No but has minor flares prior to when dose is due  Has this been reported to your provider? Yes, symptoms are still mostly controlled    PATIENT SPECIFIC NEEDS     Does the patient have any physical, cognitive, or cultural barriers? No    Is the patient high risk? No    Does the patient require physician intervention or other additional services (i.e., nutrition, smoking cessation, social work)? No    Does the patient have an additional or emergency  contact listed in their chart? Yes    SOCIAL DETERMINANTS OF HEALTH     At the [facility], we have learned that life circumstances - like trouble affording food, housing, utilities, or transportation can affect the health of many of our patients.   That is why we wanted to ask: are you currently experiencing any life circumstances that are negatively impacting your health and/or quality of life? Patient declined to answer    Social Drivers of Health     Food Insecurity: No Food Insecurity (06/12/2023)    Hunger Vital Sign     Worried About Running Out of Food in the Last Year: Never true     Ran Out of Food in the Last Year: Never true   Recent Concern: Food Insecurity - Food Insecurity Present (05/18/2023)    Hunger Vital Sign     Worried About Running Out of Food in the Last Year: Never true     Ran Out of Food in the Last Year: Sometimes true   Tobacco Use: Low Risk  (07/25/2023)    Patient History     Smoking Tobacco Use: Never     Smokeless Tobacco Use: Never     Passive Exposure: Past   Transportation Needs: No Transportation Needs (06/12/2023)    PRAPARE - Transportation     Lack of Transportation (Medical): No     Lack of Transportation (Non-Medical): No   Alcohol Use: Not At Risk (06/16/2022)    Alcohol Use     How often do you have a drink containing alcohol?: Never     How many drinks containing alcohol do you have on a typical day when you are drinking?: 1 - 2     How often do you have 5 or more drinks on one occasion?: Never   Housing: Low Risk  (06/12/2023)    Housing     Within the past 12 months, have you ever stayed: outside, in a car, in a tent, in an overnight shelter, or temporarily in someone else's home (i.e. couch-surfing)?: No     Are you worried about losing your housing?: No   Physical Activity: Not on file   Utilities: Low Risk  (06/12/2023)    Utilities     Within the past 12 months, have you been unable to get utilities (heat, electricity) when it was really needed?: No   Stress: Stress Concern Present (06/16/2022)    [employer] of Occupational Health - Occupational Stress Questionnaire     Feeling of Stress : To some extent   Interpersonal Safety: Not At Risk (08/01/2023)    Interpersonal Safety     Unsafe Where You Currently Live: No     Physically Hurt by Anyone: No     Abused by Anyone: No   Substance Use: Not on file (06/20/2023)   Intimate Partner Violence: Not At Risk (08/01/2023)    Humiliation, Afraid, Rape, and Kick questionnaire     Fear of Current or Ex-Partner: No     Emotionally Abused: No     Physically Abused: No     Sexually Abused: No   Social Connections: Not on file   Financial Resource Strain: Low Risk  (06/12/2023)    Overall Financial Resource Strain (CARDIA)     Difficulty of Paying Living Expenses: Not hard at all   Health Literacy: Low Risk  (10/07/2022)    Health Literacy     : Never   Internet Connectivity: Internet connectivity concern unknown (  06/12/2023)    Internet Connectivity     Do you have access to internet services: Yes     How do you connect to the internet: Not on file     Is your internet connection strong enough for you to watch video on your device without major problems?: Not on file     Do you have enough data to get through the month?: Not on file     Does at least one of the devices have a camera that you can use for video chat?: Not on file       Would you be willing to receive help with any of the needs that you have identified today? Not applicable       SHIPPING     Specialty Medication(s) to be Shipped:   Inflammatory Disorders: Xolair     Other medication(s) to be shipped: No additional medications requested for fill at this time     Changes to insurance: No    Cost and Payment: Patient has a $0 copay, payment information is not required.    Delivery Scheduled: Yes, Expected medication delivery date: 08/05/23.     Medication will be delivered via Same Day Courier to the confirmed prescription address in [facility].    The patient will receive a drug information handout for each medication shipped and additional FDA Medication Guides as required.  Verified that patient has previously received a Conservation officer, historic buildings and a Surveyor, mining.    The patient or caregiver noted above participated in the development of this care plan and knows that they can request review of or adjustments to the care plan at any time.      All of the patient's questions and concerns have been addressed.    Shelba Moats, PharmD   [facility] Specialty Pharmacist       [1]   Current Outpatient Medications   Medication Sig Dispense Refill    albuterol  HFA 90 mcg/actuation inhaler Inhale 2 puffs every six (6) hours as needed for wheezing or shortness of breath. 54 g 0    azelastine  (ASTELIN ) 137 mcg (0.1 %) nasal spray 2 sprays into each nostril two (2) times a day. 30 mL 5    azelastine  (OPTIVAR ) 0.05 % ophthalmic solution Administer 2 drops to both eyes daily as needed. 6 mL 5    baclofen  (LIORESAL ) 20 MG tablet 10 - 20 mg three times a day as needed for spasms. 90 tablet 2    diclofenac  sodium 20 mg/gram /actuation(2 %) sopm Apply 40 mg topically two (2) times a day. 100 g 0    empty container Misc Use as directed to dispose of Xolair  syringes 1 each 2    EPINEPHrine  (EPIPEN ) 0.3 mg/0.3 mL injection Inject 0.3 mL (0.3 mg total) into the muscle once as needed for anaphylaxis (Difficulty breathing, throat closing, etc) for up to 1 dose. 1 each 12    estradiol  (ESTRACE ) 1 MG tablet Take 1 tablet (1 mg total) by mouth daily. 30 tablet 11    famotidine  (PEPCID ) 20 MG tablet Take 1 tablet (20 mg total) by mouth two (2) times a day as needed for heartburn. 60 tablet 1    gabapentin  (NEURONTIN ) 800 MG tablet Take 1 tablet (800 mg total) by mouth two (2) times a day. 180 tablet 0    hydroxychloroquine  (PLAQUENIL ) 200 mg tablet Take 1 tablet (200 mg total) by mouth two (2) times a day. 60  tablet 12    ipratropium (ATROVENT ) 21 mcg (0.03 %) nasal spray 2 sprays into each nostril four (4) times a day. 30 mL 1    lidocaine  (LIDODERM ) 5 % patch Place 1 patch on the skin daily. Apply up to 1 patches at a time.  Use for up to 12 hours/day. 30 patch 2    milnacipran  (SAVELLA ) 12.5 mg Tab tablet Take 1 tablet in the morning for 1-2 weeks.  If tolerated, increase to 2 tablets in the morning and continue. 60 tablet 2    naloxone  (NARCAN ) 4 mg nasal spray 1 spray into alternating nostrils once as needed (opioid overdose.). One spray in either nostril once for known/suspected opioid overdose. May repeat every 2-3 minutes in alternating nostril til EMS arrives 2 each 2    omalizumab  (XOLAIR ) 150 mg/mL syringe Inject 2 mL (300 mg total) under the skin every fourteen (14) days. 4 mL 11    [START ON 10/09/2023] oxyCODONE -acetaminophen  (PERCOCET) 10-325 mg per tablet Take 1 tablet by mouth every four (4) hours as needed for pain. Max 6 tabs/day. Fill on or after: 10/09/23 180 tablet 0    oxyCODONE -acetaminophen  (PERCOCET) 10-325 mg per tablet Take 1 tablet by mouth every four (4) hours as needed for pain. Max 6 tabs/day. Fill on or after: 08/10/23 (date moved up d/t frequency change) 180 tablet 0    [START ON 09/09/2023] oxyCODONE -acetaminophen  (PERCOCET) 10-325 mg per tablet Take 1 tablet by mouth every four (4) hours as needed for pain. Max 6 tabs/day. Fill on or after: 09/09/23 180 tablet 0    pantoprazole  (PROTONIX ) 40 MG tablet TAKE 1 TABLET(40 MG) BY MOUTH DAILY 30 MINUTES BEFORE BREAKFAST AS NEEDED 90 tablet 2    potassium chloride  20 mEq TbER TAKE 1 TABLET BY MOUTH DAILY 90 tablet 2    progesterone  (PROMETRIUM ) 100 MG capsule Take 1 capsule (100 mg total) by mouth daily. 90 capsule prn    rosuvastatin  (CRESTOR ) 10 MG tablet Take 1 tablet (10 mg total) by mouth nightly. 90 tablet 3    UBRELVY  50 mg tablet TAKE 1 TABLET BY MOUTH FOR HEADACHE. MAY REPEAT IN 2 HOURS IF NEEDED. LIMIT 2 TABLETS PER DAY AND NO MORE THAN 4 TABLETS PER WEEK 16 tablet 3    XHANCE  93 mcg/actuation AerB 1 spray into each nostril two (2) times a day as needed. 16 mL 11     No current facility-administered medications for this visit.   [2]   Allergies  Allergen Reactions    Buprenorphine  Hcl Itching and Swelling    Pregabalin Swelling    Buprenorphine      Cephalexin  Monohydrate     Doxycycline      Oxycodone  Hcl     Adhesive Tape-Silicones Itching     Band-aids ok.    Doxycycline  Hyclate (Bulk) Nausea And Vomiting     GI Upset    Keflex  [Cephalexin ] Rash    Opioids - Morphine Analogues Itching    Oxycodone -Acetaminophen  Itching and Nausea And Vomiting     GI Upset- GENERIC ONLY- able to take the brand name Percocets

## 2023-08-05 LAB — DRUG SCREEN, PAIN CLINIC, URINE
2-HYDROXY ETHYL FLURAZEPAM, UR: NOT DETECTED ng/mL
6-MONOACETYLMORPHINE, UR: NOT DETECTED ng/mL
7-AMINOCLONAZEPAM, UR: NOT DETECTED ng/mL
7-AMINOFLUNITRAZEPAM, UR: NOT DETECTED ng/mL
ALPHA-HYDROXY MIDAZOLAM, UR: NOT DETECTED ng/mL
ALPHA-HYDROXY TRIAZOLAM, UR: NOT DETECTED ng/mL
ALPHA-HYDROXYALPRAZOLAM GLUCURONIDE, UR: NOT DETECTED ng/mL
ALPRAZOLAM, UR: NOT DETECTED ng/mL
AMPHETAMINE, UR: NOT DETECTED ng/mL
BUPRENORPHINE, UR: NOT DETECTED ng/mL
CHLORDIAZEPOXIDE, UR: NOT DETECTED ng/mL
CLOBAZAM, UR: NOT DETECTED ng/mL
CLONAZEPAM, UR: NOT DETECTED ng/mL
COCAINE METABOLITES URINE: NEGATIVE ng/mL
CODEINE, UR: NOT DETECTED ng/mL
CODEINE-6-BETA-GLUCURONIDE, UR: NOT DETECTED ng/mL
COMMENT,URINE: NORMAL
CREATININE-O-ADULT: 53.9 mg/dL
DIAZEPAM, UR: NOT DETECTED ng/mL
DIHYDROCODEINE, UR: NOT DETECTED ng/mL
EDDP, UR: NOT DETECTED ng/mL
EPHEDRINE, UR: NOT DETECTED ng/mL
FLUNITRAZEPAM, UR: NOT DETECTED ng/mL
FLURAZEPAM, UR: NOT DETECTED ng/mL
HYDROCODONE, UR: NOT DETECTED ng/mL
HYDROMORPHONE, UR: NOT DETECTED ng/mL
HYDROMORPHONE-3-BETA-GLUCURONIDE, UR: NOT DETECTED ng/mL
LORAZEPAM GLUCURONIDE, UR: NOT DETECTED ng/mL
LORAZEPAM, UR: NOT DETECTED ng/mL
MDA, UR: NOT DETECTED ng/mL
MDEA, UR: NOT DETECTED ng/mL
MDMA, UR: NOT DETECTED ng/mL
MEPERIDINE, UR: NOT DETECTED ng/mL
METHADONE, UR: NOT DETECTED ng/mL
METHAMPHETAMINE, UR: NOT DETECTED ng/mL
METHYLPHENIDATE, UR: NOT DETECTED ng/mL
MIDAZOLAM, UR: NOT DETECTED ng/mL
MORPHINE, UR: NOT DETECTED ng/mL
MORPHINE-6-BETA-GLUCURONIDE, UR: NOT DETECTED ng/mL
N-DESMETHYLCLOBAZAM, UR: NOT DETECTED ng/mL
N-DESMETHYLTAPENTADOL, UR: NOT DETECTED ng/mL — AB
NALOXONE, UR: NOT DETECTED ng/mL
NORBUPRENOORPHINE GLUCURONIDE, UR: NOT DETECTED ng/mL
NORBUPRENORPHINE, UR: NOT DETECTED ng/mL
NORDIAZEPAM, UR: NOT DETECTED ng/mL
NORFENTANYL, UR: NOT DETECTED ng/mL
NORHYDROCODONE, UR: NOT DETECTED ng/mL
NORMEPERIDINE, UR: NOT DETECTED ng/mL
OXAZEPAM GLUCURONIDE, UR: NOT DETECTED ng/mL
OXAZEPAM, UR: NOT DETECTED ng/mL
OXIDANTS-O-ADULT: NEGATIVE
PH-O-ADULT: 8.4
PHENCYCLIDINE, UR: NOT DETECTED ng/mL
PHENTERMINE, UR: NOT DETECTED ng/mL
PRAZEPAM, UR: NOT DETECTED ng/mL
PROPOXYPHENE, UR: NOT DETECTED ng/mL
PSEUDOEPHEDRINE, UR: NOT DETECTED ng/mL
RITALINIC ACID, UR: NOT DETECTED ng/mL
SPECIFIC GRAVITY-O-ADULT: 1.012
TAPENTADOL, UR: NOT DETECTED ng/mL
TEMAZEPAM GLUCURONIDE, UR: NOT DETECTED ng/mL
TEMAZEPAM, UR: NOT DETECTED ng/mL
THC, SCREEN URINE: NEGATIVE ng/mL
TRAMADOL, UR: NOT DETECTED ng/mL
TRIAZOLAM, UR: NOT DETECTED ng/mL
URINE BARBITURATES MAYO: NEGATIVE ng/mL
ZOLPIDEM PHENYL-4-CARBOXYLIC ACID, UR: NOT DETECTED ng/mL
ZOLPIDEM, UR: NOT DETECTED ng/mL

## 2023-08-05 MED FILL — XOLAIR 150 MG/ML SUBCUTANEOUS SYRINGE: SUBCUTANEOUS | 28 days supply | Qty: 4 | Fill #9

## 2023-08-08 ENCOUNTER — Ambulatory Visit: Admit: 2023-08-08 | Payer: Medicare (Managed Care)

## 2023-08-16 NOTE — Unmapped (Signed)
 Referring Provider: Charmaine Fuchs, MD     PCP: Charmaine Fuchs, MD    08/17/2023    Chief Complaint: microhematuria    HPI:  Ms. Natalie [name] is a 53 y.o. female with a past medical history of breast mass (4 mm on mammogram), hypothyroidism, hyperlipidemia severe medial compartment left knee osteoarthritis and right knee osteoarthritis who is was seen in consultation at the request of Dr. Charmaine Fuchs, MD for evaluation of microhematuria.    Evaluated on 07/05/2023 with PCP where she was noted to have vaginal discharge and diagnosed with acute vaginitis. UA with microscopy was sent at that time which had moderate blood. She has had microhematuria in all UA samples (at least 6 since 2024). She denies any gross hematuria.    Patient teaches water aerobics, and does develop a UTI/vaginitis every 3-4 months. No foamy urine. Does note that the urine is dark. Also works at a [employer] where she is always on her feet. No heavy weight lifting, or excessive aerobic exercise. She does go to the pool 4 times a week.     1 year prior, she thinks she had a kidney stone that passed on it's own. Thinks she may have had a couple of kidney stones over the years as well.     Currently does have urinary frequency and has burning after she pees. She also is smelling blood.   Does not get menstrual cycles, has had a hysterectomy since 2008.     Last labs on 05/20/2023, with mild hypernatremia (146) and potassium (4.8), Cr 0.66.    No known history of diabetes. No known history of hypertension. Denies any personal history of AKI.   Pt has prior kidney stones, 1 year prior.     Pt denies significant/consistent active NSAID use.   Pt has never used tobacco. Pt denies active recreational drug use.    Pt is not currently taking OTC meds or supplements.    Pt has a family history of kidney disease, including 1 cousin who was on dialysis. Prior complications of pregnancy include preterm labors x2. Has not seen a previous nephrologist.    Denies fevers, chills, headaches, chest pain, shortness of breath, nausea, vomiting, abdominal pain, diarrhea, dysuria,  ulcers, arthralgias, syncope or seizures.     ROS:  11 systems reviewed and negative except those noted in the history of present illness    PAST MEDICAL HISTORY:  Past Medical History[1]    ALLERGIES  Buprenorphine  hcl, Pregabalin, Buprenorphine , Cephalexin  monohydrate, Doxycycline , Oxycodone  hcl, Adhesive tape-silicones, Doxycycline  hyclate (bulk), Keflex  [cephalexin ], Opioids - morphine analogues, and Oxycodone -acetaminophen     SOCIAL HISTORY  Social History [2]      FAMILY HISTORY  Family History[3]     MEDICATIONS:  Current Medications[4]    PHYSICAL EXAM:     Vitals:    08/17/23 0936   BP: 133/86   Pulse: 74   Temp: 35.6 ??C (96.1 ??F)     CONSTITUTIONAL: Alert, well appearing, no distress  HEENT: Moist mucous membranes, oropharynx clear without erythema or exudate  EYES: Extra ocular movements intact. Pupils reactive, sclerae anicteric.  NECK: Supple, no lymphadenopathy  CARDIOVASCULAR: Regular, normal S1/S2 heart sounds, no murmurs, no rubs.   PULM: Clear to auscultation bilaterally  GASTROINTESTINAL: Soft, active bowel sounds, nontender  EXTREMITIES: No lower extremity edema bilaterally.   SKIN: No rashes or lesions  NEUROLOGIC: No focal motor or sensory deficits  PSYCH: alert and oriented x 3    MEDICAL DECISION  MAKING    Results for orders placed or performed in visit on 08/17/23   POCT Urinalysis Dipstick   Result Value Ref Range    Spec Gravity/POC 1.020 1.003 - 1.030    PH/POC 6.0 5.0 - 9.0    Leuk Esterase/POC Negative Negative    Nitrite/POC Negative Negative    Protein/POC Negative Negative    UA Glucose/POC Negative Negative    Ketones, POC Negative Negative    Bilirubin/POC 1+ (A) Negative    Blood/POC 2+ (A) Negative    Urobilinogen/POC 0.2 0.2 - 1.0 mg/dL     *Note: Due to a large number of results and/or encounters for the requested time period, some results have not been displayed. A complete set of results can be found in Results Review.        Creatinine, Serum   Date Value Ref Range Status   12/19/2012 0.80 0.50 - 1.10 mg/dL Final   90/82/7985 9.35 0.50 - 1.10 mg/dL Final   98/85/7985 9.34 0.50 - 1.10 mg/dL Final     Creatinine   Date Value Ref Range Status   07/05/2023 0.62 0.55 - 1.02 mg/dL Final   95/74/7974 9.33 0.55 - 1.02 mg/dL Final   90/87/7975 9.24 0.55 - 1.02 mg/dL Final   93/85/7975 9.29 0.55 - 1.02 mg/dL Final   96/92/7975 9.32 0.55 - 1.02 mg/dL Final   87/91/7983 0.8 0.4 - 1.0 mg/dl Final   93/91/7983 0.7 0.4 - 1.0 mg/dl Final   94/80/7984 0.8 0.4 - 1.0 mg/dl Final   91/98/7987 0.8 0.4 - 1.0 mg/dL Final   No results found for: GFR   BUN   Date Value Ref Range Status   07/05/2023 11 9 - 23 mg/dL Final   95/74/7974 11 9 - 23 mg/dL Final   90/87/7975 13 9 - 23 mg/dL Final   93/85/7975 7 (L) 9 - 23 mg/dL Final   96/92/7975 9 9 - 23 mg/dL Final   87/91/7983 12 8 - 26 mg/dL Final   93/91/7983 11 8 - 26 mg/dL Final   94/80/7984 8 8 - 26 mg/dL Final   91/98/7987 9 8 - 26 mg/dL Final     BUN, Bld   Date Value Ref Range Status   12/19/2012 12 6 - 23 mg/dL Final   90/82/7985 12 6 - 23 mg/dL Final   98/85/7985 11 6 - 23 mg/dL Final      No results found for: PHOS   Calcium   Date Value Ref Range Status   07/05/2023 10.1 8.7 - 10.4 mg/dL Final   95/74/7974 89.8 8.7 - 10.4 mg/dL Final   90/87/7975 9.7 8.7 - 10.4 mg/dL Final   93/85/7975 9.5 8.7 - 10.4 mg/dL Final   96/92/7975 9.7 8.7 - 10.4 mg/dL Final   87/91/7983 9.2 8.4 - 10.5 mg/dL Final   93/91/7983 9.2 8.4 - 10.5 mg/dL Final   94/80/7984 9.2 8.4 - 10.5 mg/dL Final   91/98/7987 9.1 8.4 - 10.5 mg/dL Final      No results found for: PTH   HGB   Date Value Ref Range Status   05/20/2023 13.4 11.3 - 14.9 g/dL Final   90/87/7975 86.9 11.3 - 14.9 g/dL Final   93/85/7975 86.6 11.3 - 14.9 g/dL Final   96/84/7975 86.3 11.3 - 14.9 g/dL Final   96/92/7975 86.4 11.3 - 14.9 g/dL Final   93/91/7983 86.7 11.9 - 15.8 g/dL Final   88/90/7984 86.3 11.9 - 15.8 g/dL Final   94/80/7984 86.5 11.9 - 15.8  g/dL Final   88/74/7985 86.5 12.0 - 15.0 g/dL Final   92/83/7985 86.5 12.0 - 15.0 g/dL Final   98/85/7985 86.7 12.0 - 15.0 g/dL Final   91/98/7987 86.4 11.9 - 15.8 g/dL Final      CO2   Date Value Ref Range Status   07/05/2023 29.0 20.0 - 31.0 mmol/L Final   05/20/2023 32.0 (H) 20.0 - 31.0 mmol/L Final   10/07/2022 34.0 (H) 20.0 - 31.0 mmol/L Final   07/09/2022 31.0 20.0 - 31.0 mmol/L Final   04/01/2022 33.0 (H) 20.0 - 31.0 mmol/L Final   01/02/2015 30.0 22.0 - 32.0 mmol/L Final   07/03/2014 23.0 22.0 - 32.0 mmol/L Final   06/12/2013 24.0 22.0 - 32.0 mmol/L Final   08/26/2010 20.0 (L) 22.0 - 32.0 mmol/L Final         Urine Sediment: many squamous cells, 8-10 monomorphic RBCs seen per HPF        ASSESSMENT/PLAN:    Ms.Natalie [name] is a 53 y.o. patient with a past medical history significant forbreast mass (4 mm on mammogram), hypothyroidism severe medial compartment left knee osteoarthritis and right knee osteoarthritis who is being seen in clinic for initial evaluation of microscopic hematuria, which is less likely glomerular in etiology, as urine sediment without evidence of  monomorphic RBCs.     # Microscopic hematuria  - Urine sediment with 8-10 monomorphic RBCs seen per HPF  - UA without leukesterases, nitrites (patient has symptoms of frequency and dysura but less likely UTI)  - Due to monomorphic nature of RBCs, less likely glomerular pathology, recommend urologic evaluation, which is scheduled for Sep 2025    All questions/concerns addressed. No follow up arranged.    Waylyn Tenbrink  [facility]  Renal Fellow                               [1]   Past Medical History:  Diagnosis Date    Allergic conjunctivitis 11/23/2018    Allergic rhinitis     Anemia 1986    Arthritis     Breast cyst 85 & 87 both breast    Removed    Breast mass     Chondromalacia of left knee     Chondromalacia of right knee     Chronic kidney disease     Cluster headache     Depressive disorder     Dizziness     Fibrocystic breast     GERD (gastroesophageal reflux disease)     Headache, tension-type     Hematuria     History of kidney stones     History of transfusion     Spinal Fusion    Hyperlipidemia 1999    Hypothyroidism     Kidney stone     Lumbar disc disease 1998    She was injured at work at the age of 74 and then had disk surgery 2 years later     Menopause ovarian failure     Menopause, premature     Migraine with aura     Neuromuscular disorder    1999    Obesity (BMI 30-39.9) 11/23/2018    Ovarian cyst     Plantar fasciitis     Pseudotumor cerebri     Rash due to allergy 11/23/2018    Scoliosis     Sickle cell trait (CMS-HCC)     Sleep apnea  Urinary tract infection     Vaginitis    [2]   Social History  Socioeconomic History    Marital status: Single    Number of children: 2    Years of education: 15   Occupational History     Employer: NOT EMPLOYED    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   Tobacco Use    Smoking status: Never     Passive exposure: Past    Smokeless tobacco: Never   Vaping Use    Vaping status: Never Used   Substance and Sexual Activity    Alcohol use: Yes     Comment: Only drink for really special occasions    Drug use: Never    Sexual activity: Yes     Partners: Male     Birth control/protection: Post-menopausal, Surgical     Comment: steady boyfriend   Social History Narrative    Marital Status - Single     Children - Son(s) [2]    Pets - Dog (1) Turtle (1)     Household - Lives with sons and grandson Hennie)     Occupation - Disabled since 2002    Designer, fashion/clothing Use - Denies      Diet - Regular    Exercise/Sports - Walking     Hobbies - Conservation officer, historic buildings - [employer] Degree Psychologist, forensic)     Country of Origin - [place]                       Social Drivers of Psychologist, prison and probation services Strain: Low Risk  (06/12/2023)    Overall Financial Resource Strain (CARDIA)     Difficulty of Paying Living Expenses: Not hard at all   Food Insecurity: No Food Insecurity (06/12/2023)    Hunger Vital Sign     Worried About Running Out of Food in the Last Year: Never true     Ran Out of Food in the Last Year: Never true   Recent Concern: Food Insecurity - Food Insecurity Present (05/18/2023)    Hunger Vital Sign     Worried About Running Out of Food in the Last Year: Never true     Ran Out of Food in the Last Year: Sometimes true   Transportation Needs: No Transportation Needs (06/12/2023)    PRAPARE - Transportation     Lack of Transportation (Medical): No     Lack of Transportation (Non-Medical): No   Stress: Stress Concern Present (06/16/2022)    [employer] of Occupational Health - Occupational Stress Questionnaire     Feeling of Stress : To some extent   Housing: Low Risk  (06/12/2023)    Housing     Within the past 12 months, have you ever stayed: outside, in a car, in a tent, in an overnight shelter, or temporarily in someone else's home (i.e. couch-surfing)?: No     Are you worried about losing your housing?: No   [3]   Family History  Problem Relation Age of Onset    Hypertension Mother     Heart disease Mother     Arthritis Mother     Depression Mother     Drug abuse Mother     Mental illness Mother     Ulcers Mother     COPD Mother     Allergic rhinitis Mother     Heart attack Father     Mental illness Father  Asthma Father     Cancer Sister         Lymphoma    Heart attack Maternal Grandmother     Heart disease Maternal Grandmother     Hypertension Maternal Grandmother     Thyroid disease Maternal Grandmother     Stroke Paternal Grandfather     Depression Son     Drug abuse Son     Mental illness Son     COPD Maternal Aunt     Migraines Maternal Aunt     Heart attack Cousin 55    Breast cancer Cousin 32    Thyroid disease Other     Cancer Sister     COPD Maternal Aunt     Migraines Maternal Aunt     Depression Son     Drug abuse Son     Mental illness Son     Alcohol abuse Neg Hx    [4]   Current Outpatient Medications   Medication Sig Dispense Refill    albuterol  HFA 90 mcg/actuation inhaler Inhale 2 puffs every six (6) hours as needed for wheezing or shortness of breath. 54 g 0    azelastine  (ASTELIN ) 137 mcg (0.1 %) nasal spray 2 sprays into each nostril two (2) times a day. 30 mL 5    azelastine  (OPTIVAR ) 0.05 % ophthalmic solution Administer 2 drops to both eyes daily as needed. 6 mL 5    baclofen  (LIORESAL ) 20 MG tablet 10 - 20 mg three times a day as needed for spasms. 90 tablet 2    diclofenac  sodium 20 mg/gram /actuation(2 %) sopm Apply 40 mg topically two (2) times a day. 100 g 0    empty container Misc Use as directed to dispose of Xolair  syringes 1 each 2    EPINEPHrine  (EPIPEN ) 0.3 mg/0.3 mL injection Inject 0.3 mL (0.3 mg total) into the muscle once as needed for anaphylaxis (Difficulty breathing, throat closing, etc) for up to 1 dose. 1 each 12    estradiol  (ESTRACE ) 1 MG tablet Take 1 tablet (1 mg total) by mouth daily. 30 tablet 11    famotidine  (PEPCID ) 20 MG tablet Take 1 tablet (20 mg total) by mouth two (2) times a day as needed for heartburn. 60 tablet 1    gabapentin  (NEURONTIN ) 800 MG tablet Take 1 tablet (800 mg total) by mouth two (2) times a day. 180 tablet 0    hydroxychloroquine  (PLAQUENIL ) 200 mg tablet Take 1 tablet (200 mg total) by mouth two (2) times a day. 60 tablet 12    ipratropium (ATROVENT ) 21 mcg (0.03 %) nasal spray 2 sprays into each nostril four (4) times a day. 30 mL 1    lidocaine  (LIDODERM ) 5 % patch Place 1 patch on the skin daily. Apply up to 1 patches at a time.  Use for up to 12 hours/day. 30 patch 2    milnacipran  (SAVELLA ) 12.5 mg Tab tablet Take 1 tablet in the morning for 1-2 weeks.  If tolerated, increase to 2 tablets in the morning and continue. 60 tablet 2    naloxone  (NARCAN ) 4 mg nasal spray 1 spray into alternating nostrils once as needed (opioid overdose.). One spray in either nostril once for known/suspected opioid overdose. May repeat every 2-3 minutes in alternating nostril til EMS arrives 2 each 2    omalizumab  (XOLAIR ) 150 mg/mL syringe Inject 2 mL (300 mg total) under the skin every fourteen (14) days. 4 mL 11    [START ON 10/09/2023]  oxyCODONE -acetaminophen  (PERCOCET) 10-325 mg per tablet Take 1 tablet by mouth every four (4) hours as needed for pain. Max 6 tabs/day. Fill on or after: 10/09/23 180 tablet 0    oxyCODONE -acetaminophen  (PERCOCET) 10-325 mg per tablet Take 1 tablet by mouth every four (4) hours as needed for pain. Max 6 tabs/day. Fill on or after: 08/10/23 (date moved up d/t frequency change) 180 tablet 0    [START ON 09/09/2023] oxyCODONE -acetaminophen  (PERCOCET) 10-325 mg per tablet Take 1 tablet by mouth every four (4) hours as needed for pain. Max 6 tabs/day. Fill on or after: 09/09/23 180 tablet 0    pantoprazole  (PROTONIX ) 40 MG tablet TAKE 1 TABLET(40 MG) BY MOUTH DAILY 30 MINUTES BEFORE BREAKFAST AS NEEDED 90 tablet 2    potassium chloride  20 mEq TbER TAKE 1 TABLET BY MOUTH DAILY 90 tablet 2    progesterone  (PROMETRIUM ) 100 MG capsule Take 1 capsule (100 mg total) by mouth daily. 90 capsule prn    rosuvastatin  (CRESTOR ) 10 MG tablet Take 1 tablet (10 mg total) by mouth nightly. 90 tablet 3    UBRELVY  50 mg tablet TAKE 1 TABLET BY MOUTH FOR HEADACHE. MAY REPEAT IN 2 HOURS IF NEEDED. LIMIT 2 TABLETS PER DAY AND NO MORE THAN 4 TABLETS PER WEEK 16 tablet 3    XHANCE  93 mcg/actuation AerB 1 spray into each nostril two (2) times a day as needed. 16 mL 11     No current facility-administered medications for this visit.

## 2023-08-17 ENCOUNTER — Ambulatory Visit: Admit: 2023-08-17 | Discharge: 2023-08-18 | Payer: Medicare (Managed Care)

## 2023-08-17 DIAGNOSIS — R3129 Other microscopic hematuria: Principal | ICD-10-CM

## 2023-08-17 DIAGNOSIS — M5417 Radiculopathy, lumbosacral region: Principal | ICD-10-CM

## 2023-08-17 MED ORDER — LIDOCAINE 5 % TOPICAL PATCH
MEDICATED_PATCH | TRANSDERMAL | 2 refills | 30.00000 days | Status: CP
Start: 2023-08-17 — End: ?

## 2023-08-17 NOTE — Unmapped (Signed)
 Rec'd refill requested by fax from patient's pharmacy for Lidocaine  5% patch  Last OV: 08/01/2023  Next OV: 08/29/23  Please consider refill if appropriate

## 2023-08-19 NOTE — Unmapped (Signed)
 Prior Authorization History  lidocaine  (LIDODERM ) 5 % patch     Approval Details    Authorization number: [id number]  Authorized from August 19, 2023 to January 25, 2024  Information received electronically from payer     History    View all authorizations for this medication  Approved   08/19/2023 10:26 AM  User: Sherre Devere FALCON, RN Appeal supported: No   Note from payer: Request Reference Number: [id number].  LIDOCAINE     DIS 5% PATCH is approved through 01/25/2024.  Your patient may now fill this prescription and it will be covered.   Payer: [employer] Part D Case ID: [id number]    [phone]    [id number]    Notes     Time User Attachment    Attachment received from payer. 08/19/2023 10:26 AM Interface, Epa In Electronic Prior Authorization Attachment - Document    Waiting for Payer Response   08/19/2023 10:23 AM  Deadline to reply: August 24, 2023 10:04 AM User: Sherre Devere FALCON, RN   Note to payer: The attachment was sent by alternative means.   Payer: [employer] [facility] [employer] Part D Case ID: [id number]    [phone]    [phone]  Attachment: The attachment was sent by alternative means.  Prior Authorization Request Evaluation Questions   This request expires on 08/24/2023 09:03 AM CST. Please provide all information requested. Failure to complete this form in its entirety may result in delayed processing or an adverse determination for insufficient information.  Waiting for Payer Response   08/17/2023 10:03 AM  User: Sebastian Pierce, FNP   Payer: [employer] Generic Payer    Devere Sherre, RN

## 2023-08-25 DIAGNOSIS — M51379 Degeneration of intervertebral disc of lumbosacral region, unspecified whether pain present: Principal | ICD-10-CM

## 2023-08-25 NOTE — Unmapped (Signed)
 Pre-procedure call. Left message regarding appointment time, arrival time, and need for a driver. Advised patient to call [phone] if experiencing Covid symptoms, tested positive for Covid in the past 10 days, or taking antibiotics for an infection. Also notified patient to call the same phone number to speak with a nurse for pre-procedure instructions.       08/25/23 1131   Pre-op Phone Call   Surgery Time Verified No   Expected Arrival Time 0745   Arrival Time Verified No   Surgery Location Verified No   Ride and Caregiver Arranged No

## 2023-08-26 NOTE — Unmapped (Signed)
 [facility] Refill Coordination Note    Natalie [name], Natalie [name]: [birth year]  Phone: There are no phone numbers on file.      All above HIPAA information was verified with patient.         08/25/2023     8:57 PM   Specialty Rx Medication Refill Questionnaire   Which Medications would you like refilled and shipped? Xolair    Please list all current allergies: Pollen   Have you missed any doses in the last 30 days? No   Have you had any changes to your medication(s) since your last refill? No   How much of each medication do you have remaining at home? (eg. number of tablets, injections, etc.) None   If receiving an injectable medication, next injection date is 09/04/2023   Have you experienced any side effects in the last 30 days? No   Please enter the full address (street address, city, state, zip code) where you would like your medication(s) to be delivered to. [address] [name] [address]   Please specify on which day you would like your medication(s) to arrive. Note: if you need your medication(s) within 3 days, please call the pharmacy to schedule your order at [phone]  08/29/2023   Has your insurance changed since your last refill? No   Would you like a pharmacist to call you to discuss your medication(s)? No   Do you require a signature for your package? (Note: if we are billing Medicare Part B or your order contains a controlled substance, we will require a signature) No   I have been provided my out of pocket cost for my medication and approve the pharmacy to charge the amount to my credit card on file. Yes         Completed refill call assessment today to schedule patient's medication shipment from the [facility] [phone]).  All relevant notes have been reviewed.       Confirmed patient received a Conservation officer, historic buildings and a Surveyor, mining with first shipment. The patient will receive a drug information handout for each medication shipped and additional FDA Medication Guides as required.         REFERRAL TO PHARMACIST     Referral to the pharmacist: Not needed      [facility]     Shipping address confirmed in Epic.     Delivery Scheduled: Yes, Expected medication delivery date: 08/29/23.     Medication will be delivered via Same Day Courier to the prescription address in Epic WAM.    Natalie [name]   [facility] Specialty Technician
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
## 2023-08-29 ENCOUNTER — Ambulatory Visit
Admit: 2023-08-29 | Discharge: 2023-08-29 | Payer: Medicare (Managed Care) | Attending: Anesthesiology | Primary: Anesthesiology

## 2023-08-29 ENCOUNTER — Inpatient Hospital Stay: Admit: 2023-08-29 | Discharge: 2023-08-29 | Payer: Medicare (Managed Care)

## 2023-08-29 ENCOUNTER — Ambulatory Visit: Admit: 2023-08-29 | Discharge: 2023-08-30 | Payer: Medicare (Managed Care)

## 2023-08-29 DIAGNOSIS — R82998 Other abnormal findings in urine: Principal | ICD-10-CM

## 2023-08-29 DIAGNOSIS — R350 Frequency of micturition: Principal | ICD-10-CM

## 2023-08-29 DIAGNOSIS — M545 Acute left-sided low back pain without sciatica: Principal | ICD-10-CM

## 2023-08-29 DIAGNOSIS — J301 Allergic rhinitis due to pollen: Principal | ICD-10-CM

## 2023-08-29 DIAGNOSIS — R0789 Other chest pain: Principal | ICD-10-CM

## 2023-08-29 DIAGNOSIS — M549 Dorsalgia, unspecified: Principal | ICD-10-CM

## 2023-08-29 DIAGNOSIS — M79605 Pain in left leg: Principal | ICD-10-CM

## 2023-08-29 LAB — URINALYSIS WITH MICROSCOPY WITH CULTURE REFLEX PERFORMABLE
BACTERIA: NONE SEEN /HPF
BILIRUBIN UA: NEGATIVE
HYPHAL YEAST: NONE SEEN /HPF
KETONES UA: NEGATIVE
LEUKOCYTE ESTERASE UA: NEGATIVE
NITRITE UA: NEGATIVE
PH UA: 7 (ref 5.0–9.0)
RBC UA: 77 /HPF — ABNORMAL HIGH (ref 0–3)
SPECIFIC GRAVITY UA: 1.023 (ref 1.005–1.030)
SQUAMOUS EPITHELIAL: 10 /HPF — ABNORMAL HIGH (ref 0–5)
UROBILINOGEN UA: <2
WBC CLUMPS: NONE SEEN /HPF
WBC UA: 1 /HPF (ref 0–2)
YEAST: NONE SEEN /HPF

## 2023-08-29 MED ORDER — ALBUTEROL SULFATE HFA 90 MCG/ACTUATION AEROSOL INHALER
Freq: Four times a day (QID) | RESPIRATORY_TRACT | 0 refills | 0.00000 days | Status: CP | PRN
Start: 2023-08-29 — End: 2024-08-28

## 2023-08-29 MED ADMIN — triamcinolone acetonide (KENALOG-40) injection: @ 12:00:00 | Stop: 2023-08-29

## 2023-08-29 MED ADMIN — iohexol (OMNIPAQUE) 180 mg iodine/mL solution 1 mL: 1 mL | @ 13:00:00 | Stop: 2023-08-29

## 2023-08-29 MED FILL — XOLAIR 150 MG/ML SUBCUTANEOUS SYRINGE: SUBCUTANEOUS | 28 days supply | Qty: 4 | Fill #10
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
## 2023-09-07 ENCOUNTER — Ambulatory Visit
Admit: 2023-09-07 | Discharge: 2023-09-08 | Payer: Medicare (Managed Care) | Attending: Psychologist | Primary: Psychologist

## 2023-09-07 DIAGNOSIS — F1190 Opioid use, unspecified, uncomplicated: Principal | ICD-10-CM

## 2023-09-07 DIAGNOSIS — F419 Anxiety disorder, unspecified: Principal | ICD-10-CM

## 2023-09-07 DIAGNOSIS — F4321 Adjustment disorder with depressed mood: Principal | ICD-10-CM

## 2023-09-07 NOTE — Unmapped (Signed)
 [facility]   Confidential Psychological Therapy Session    Patient Name: Natalie [name]  Medical Record Number: [id number]  Date of Service: September 07, 2023  Attending Psychologist: Greig Holland, PhD  CPT Procedure Code: 09162 for 60 minutes of face to face counseling    This visit was attempted via face to face with interactive technology using a HIPPA compliant audio/visual platform. Patient unable to connect today so we completed the visit via phone (audio only). We reviewed confidentiality today. The patient was present in [place] , a state in which this provider is licensed and able to provide care (location and contact information confirmed), attended this visit alone, and consented to this virtual pain psychology visit.    REFERRING PHYSICIAN: Prentice Cost, MD    CHIEF COMPLAINT AND REASON FOR VISIT:  COM follow up evaluation/pain coping skills; CBT/ACT for pain; grief    SUBJECTIVE / HISTORY OF PRESENT ILLNESS: [name] is a very pleasant 53 y.o.  female with chronic lower back pain, chronic headaches, and myofascial pain. Her pain started in 1999 in her lower back.  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  She had a spinal fusion in 2001 at the L5-S1 level.  She has had bilateral shoulder pain, neck pain, lower back pain, and b/l knees.  She also described a radiculopathy b/l that goes from her back to her bilateral buttocks and down to her toes.      Pt initially established with Dr. Cost in 08/2016 and then was was first evaluated by me in 07/2017. Last follow up with me was 07/18/2023.  Patient processes thoughts and feelings related to pain, recent career transitions and shifts, financial needs, and other psychosocial factors.  Mood and anxiety are stable.  Has been spending more time with her granddaughters.  Was able to see her niece; new stepmother has been allowing her to visit with family which is a relief given prior circumstances.  Patient reviews values and develops a committed action plan to valued activities, particularly with respect to career changes.  Spent extra time today practicing mindful breathing and body scan, connecting mind and body and shifting perspective given changes.    OBJECTIVE / MENTAL STATUS:    Appearance:   Appears stated age and Clean/Neat   Motor:  No abnormal movements   Speech/Language:   Normal rate, volume, tone, fluency   Mood:  Irritable   Affect:  Blunted   Thought process:  Logical, linear, clear, coherent, goal directed   Thought content:    Denies SI, HI, self harm, delusions, obsessions, paranoid ideation, or ideas of reference   Perceptual disturbances:    Denies auditory and visual hallucinations, behavior not concerning for response to internal stimuli   Orientation:  Oriented to person, place, time, and general circumstances   Attention:  Able to fully attend without fluctuations in consciousness   Concentration:  Able to fully concentrate and attend   Memory:  Immediate, short-term, long-term, and recall grossly intact    Fund of knowledge:   Consistent with level of education and development   Insight:    Fair   Judgment:   Intact   Impulse Control:  Intact       DIAGNOSTIC IMPRESSION:   Anxiety NOS  Chronic continuou use of opiates  Grief    /ASSESSMENT:   [name] is a very pleasant 53 y.o.  female from [place], [place] with  chronic lower back pain, chronic . headaches, and myofascial pain. Her pain started in 1999 in her lower back.  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  She had a spinal fusion in 2001 at the L5-S1 level.  She has had bilateral shoulder pain, neck pain, lower back pain, and b/l knees.  She also described a radiculopathy b/l that goes from her back to her bilateral buttocks and down to her toes.  The patient is currently considered to be high risk due to prior nonadherence, but appropriate with a behavioral adherence plan in place.  She is shifting her career, and connects her committed action plan to values, goals and needs.  Mood and anxiety are stable, reasonably well-managed at this time.    PLAN:   (1) COM - high risk but remains appropriate with a behavioral adherence plan also in place.    -Patient MUST meet with pain psychology/me once a month.   -No additional overuse of opioids will be tolerated.    -Patient should always bring her medication to clinic for pill counts.  -Patient was informed she has had numerous infractions with respect to her pain contract over the years, and that no further infractions will be tolerated.    If any additional pain contract or behavioral adherence contract infractions occur, I recommend stopping opioids.    Pt doing well with adherence, per our last few visits.     (2) Pain coping skills- addressed compliance, as well as continued skills practice    (3) follow-in 70month

## 2023-09-08 NOTE — Unmapped (Signed)
 Patient stopped by office needing disability placard completed; pt had previous appointment last week on 8/4. Informed patient Dr. Charmaine currently out of office this week will send message to provider for further review to see if forms can be completed once return to office on next week.      Thanks!  Nia

## 2023-09-09 MED ORDER — OXYCODONE-ACETAMINOPHEN 10 MG-325 MG TABLET
ORAL_TABLET | ORAL | 0 refills | 30.00000 days | Status: CP | PRN
Start: 2023-09-09 — End: ?

## 2023-09-15 NOTE — Unmapped (Signed)
 Reached out to patient in regards to previous message below. Pt stated she is requesting for disability placard to be completed more then six months. Patient would like to consult with provider to see if she should send it to pain management provider, if so she will be by office to pick up forms. Informed patient will send message to Dr. Charmaine to confirm.     Thanks!  Nia

## 2023-09-16 NOTE — Unmapped (Signed)
 Patient verbalized understanding in regards to message below; PT will be by office to pick up forms.

## 2023-09-16 NOTE — Unmapped (Signed)
 Forms placed at the front desk for patient pick up.      Thanks!  Nia

## 2023-09-23 NOTE — Unmapped (Signed)
 [facility] Refill Coordination Note    Natalie [name], Natalie [name]: [birth year]  Phone: There are no phone numbers on file.      All above HIPAA information was verified with patient.         09/22/2023     9:04 PM   Specialty Rx Medication Refill Questionnaire   Which Medications would you like refilled and shipped? Xolair    Please list all current allergies: In records   Have you missed any doses in the last 30 days? No   Have you had any changes to your medication(s) since your last refill? No   How much of each medication do you have remaining at home? (eg. number of tablets, injections, etc.) 0   If receiving an injectable medication, next injection date is 09/30/2023   Have you experienced any side effects in the last 30 days? No   Please enter the full address (street address, city, state, zip code) where you would like your medication(s) to be delivered to. [address] , [place] [address]   Please specify on which day you would like your medication(s) to arrive. Note: if you need your medication(s) within 3 days, please call the pharmacy to schedule your order at [phone]  09/27/2023   Has your insurance changed since your last refill? No   Would you like a pharmacist to call you to discuss your medication(s)? No   Do you require a signature for your package? (Note: if we are billing Medicare Part B or your order contains a controlled substance, we will require a signature) No   I have been provided my out of pocket cost for my medication and approve the pharmacy to charge the amount to my credit card on file. Yes         Completed refill call assessment today to schedule patient's medication shipment from the [facility] [phone]).  All relevant notes have been reviewed.       Confirmed patient received a Conservation officer, historic buildings and a Surveyor, mining with first shipment. The patient will receive a drug information handout for each medication shipped and additional FDA Medication Guides as required.         REFERRAL TO PHARMACIST     Referral to the pharmacist: Not needed      [facility]     Shipping address confirmed in Epic.     Delivery Scheduled: Yes, Expected medication delivery date: 09/27/23.     Medication will be delivered via Same Day Courier to the prescription address in Epic WAM.    Natalie [name]   [facility] Specialty Technician

## 2023-09-27 MED FILL — XOLAIR 150 MG/ML SUBCUTANEOUS SYRINGE: SUBCUTANEOUS | 28 days supply | Qty: 4 | Fill #11

## 2023-09-28 DIAGNOSIS — M47816 Spondylosis without myelopathy or radiculopathy, lumbar region: Principal | ICD-10-CM

## 2023-09-28 DIAGNOSIS — M170 Bilateral primary osteoarthritis of knee: Principal | ICD-10-CM

## 2023-09-28 DIAGNOSIS — M47812 Spondylosis without myelopathy or radiculopathy, cervical region: Principal | ICD-10-CM

## 2023-09-28 DIAGNOSIS — G43E01 Chronic migraine with aura, not intractable, with status migrainosus: Principal | ICD-10-CM

## 2023-09-28 DIAGNOSIS — M7918 Myalgia, other site: Principal | ICD-10-CM

## 2023-09-28 DIAGNOSIS — M5417 Radiculopathy, lumbosacral region: Principal | ICD-10-CM

## 2023-09-28 MED ORDER — UBRELVY 50 MG TABLET
ORAL_TABLET | 3 refills | 0.00000 days
Start: 2023-09-28 — End: ?

## 2023-09-28 MED ORDER — OXYCODONE-ACETAMINOPHEN 10 MG-325 MG TABLET
ORAL_TABLET | ORAL | 0 refills | 2.00000 days | Status: CP | PRN
Start: 2023-09-28 — End: ?

## 2023-10-03 ENCOUNTER — Ambulatory Visit: Admit: 2023-10-03 | Discharge: 2023-10-04 | Payer: Medicare (Managed Care)

## 2023-10-03 DIAGNOSIS — R3915 Urgency of urination: Principal | ICD-10-CM

## 2023-10-03 DIAGNOSIS — R350 Frequency of micturition: Principal | ICD-10-CM

## 2023-10-03 DIAGNOSIS — N760 Acute vaginitis: Principal | ICD-10-CM

## 2023-10-03 DIAGNOSIS — B9689 Other specified bacterial agents as the cause of diseases classified elsewhere: Principal | ICD-10-CM

## 2023-10-03 DIAGNOSIS — Z23 Encounter for immunization: Principal | ICD-10-CM

## 2023-10-03 MED ORDER — METRONIDAZOLE 500 MG TABLET
ORAL_TABLET | Freq: Two times a day (BID) | ORAL | 0 refills | 7.00000 days | Status: CP
Start: 2023-10-03 — End: 2023-10-10

## 2023-10-03 NOTE — Unmapped (Signed)
 Symptoms started a month ago, denies any pain or burning  Always has low back pain, pain in belly into side- did pass some kidney stones
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
## 2023-10-09 MED ORDER — OXYCODONE-ACETAMINOPHEN 10 MG-325 MG TABLET
ORAL_TABLET | ORAL | 0 refills | 30.00000 days | Status: CP | PRN
Start: 2023-10-09 — End: ?

## 2023-10-10 DIAGNOSIS — L501 Idiopathic urticaria: Principal | ICD-10-CM

## 2023-10-10 DIAGNOSIS — L509 Urticaria, unspecified: Principal | ICD-10-CM

## 2023-10-10 DIAGNOSIS — Z5181 Encounter for therapeutic drug level monitoring: Principal | ICD-10-CM

## 2023-10-10 MED ORDER — HYDROXYCHLOROQUINE 200 MG TABLET
ORAL_TABLET | Freq: Two times a day (BID) | ORAL | 12 refills | 30.00000 days | Status: CP
Start: 2023-10-10 — End: ?
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
## 2023-10-10 NOTE — Unmapped (Addendum)
 VISIT SUMMARY:  You visited us  today due to persistent itching and hives that are worsened by your work conditions. We discussed your current treatment and made some adjustments to help manage your symptoms better.    YOUR PLAN:  CHRONIC SPONTANEOUS URTICARIA: You have chronic hives that cause significant itching, especially on your back, legs, arms, and fingers. Your symptoms are worsened by heat and sweating at work.  -Continue taking Zyrtec , 2 tablets in the morning and 2 tablets at night.  -Continue receiving Xolair  injections every two weeks.  -Restart taking Plaquenil , 1 tablet in the morning and 1 at night. It will take about 6 weeks to become fully effective.  -We have sent your Plaquenil  prescription to [facility] on [address].  -Schedule a follow-up appointment in 3 months to assess your response to the treatment and manage your symptoms.  -Ensure you have an annual eye examination due to potential, though rare, side effects of Plaquenil .

## 2023-10-16 DIAGNOSIS — M5417 Radiculopathy, lumbosacral region: Principal | ICD-10-CM

## 2023-10-16 DIAGNOSIS — M7918 Myalgia, other site: Principal | ICD-10-CM

## 2023-10-16 MED ORDER — GABAPENTIN 800 MG TABLET
ORAL_TABLET | Freq: Two times a day (BID) | ORAL | 0 refills | 0.00000 days
Start: 2023-10-16 — End: ?

## 2023-10-17 MED ORDER — GABAPENTIN 800 MG TABLET
ORAL_TABLET | Freq: Two times a day (BID) | ORAL | 0 refills | 90.00000 days
Start: 2023-10-17 — End: ?

## 2023-10-17 NOTE — Unmapped (Signed)
 Has appt this week.  Refill swill be addressed at that visit

## 2023-10-19 ENCOUNTER — Encounter
Admit: 2023-10-19 | Discharge: 2023-10-20 | Payer: Medicare (Managed Care) | Attending: Psychologist | Primary: Psychologist

## 2023-10-19 DIAGNOSIS — F419 Anxiety disorder, unspecified: Principal | ICD-10-CM

## 2023-10-19 DIAGNOSIS — F1190 Opioid use, unspecified, uncomplicated: Principal | ICD-10-CM

## 2023-10-19 DIAGNOSIS — L501 Idiopathic urticaria: Principal | ICD-10-CM

## 2023-10-19 DIAGNOSIS — G894 Chronic pain syndrome: Principal | ICD-10-CM

## 2023-10-19 MED ORDER — XOLAIR 150 MG/ML SUBCUTANEOUS SYRINGE
SUBCUTANEOUS | 11 refills | 28.00000 days | Status: CP
Start: 2023-10-19 — End: ?
  Filled 2023-10-24: qty 4, 28d supply, fill #0

## 2023-10-19 NOTE — Unmapped (Signed)
 Patient last seen 09/2023.  Refill sent per protocol.

## 2023-10-19 NOTE — Unmapped (Signed)
 [facility] Pharm[facility] Refill Coordination Note    Natalie [name], [place]: 1970-04-23  Phone: [phone] (home)       All above HIPAA information was verified with patient.         10/19/2023     8:47 AM   Specialty Rx Medication Refill Questionnaire   Which Medications would you like refilled and shipped? Xolair    Please list all current allergies: Chart   Have you missed any doses in the last 30 days? No   Have you had any changes to your medication(s) since your last refill? No   How much of each medication do you have remaining at home? (eg. number of tablets, injections, etc.) 0   If receiving an injectable medication, next injection date is 10/24/2023   Have you experienced any side effects in the last 30 days? No   Please enter the full address (street address, city, state, zip code) where you would like your medication(s) to be delivered to. [address] [name] [address]   Please specify on which day you would like your medication(s) to arrive. Note: if you need your medication(s) within 3 days, please call the pharmacy to schedule your order at [phone]  10/24/2023   Has your insurance changed since your last refill? No   Would you like a pharmacist to call you to discuss your medication(s)? No   Do you require a signature for your package? (Note: if we are billing Medicare Part B or your order contains a controlled substance, we will require a signature) No   I have been provided my out of pocket cost for my medication and approve the pharmacy to charge the amount to my credit card on file. Yes         Completed refill call assessment today to schedule patient's medication shipment from the [facility] [phone]).  All relevant notes have been reviewed.       Confirmed patient received a Conservation officer, historic buildings and a Surveyor, mining with first shipment. The patient will receive a drug information handout for each medication shipped and additional FDA Medication Guides as required.         REFERRAL TO PHARMACIST     Referral to the pharmacist: Not needed      [facility]     Shipping address confirmed in Epic.     Delivery Scheduled: Yes, Expected medication delivery date: 10/24/2023.     Medication will be delivered via Same Day Courier to the prescription address in Epic WAM.    Natalie [name]   [facility] Pharm[facility] Specialty Technician

## 2023-10-19 NOTE — Unmapped (Signed)
 [facility]   Confidential Psychological Therapy Session    Patient Name: Natalie [name]  Medical Record Number: [id number]  Date of Service: October 19, 2023  Attending Psychologist: Greig Holland, PhD  CPT Procedure Code: 09162 for 60 minutes of face to face counseling    This visit was attempted via face to face with interactive technology using a HIPPA compliant audio/visual platform. Patient unable to connect today (often she can, but had technical difficulties today) so we completed the visit via phone (audio only). We reviewed confidentiality today. The patient was present in [place] , a state in which this provider is licensed and able to provide care (location and contact information confirmed), attended this visit alone, and consented to this virtual pain psychology visit.    REFERRING PHYSICIAN: Prentice Cost, MD    CHIEF COMPLAINT AND REASON FOR VISIT:  COM follow up evaluation/pain coping skills; CBT/ACT for pain; grief    SUBJECTIVE / HISTORY OF PRESENT ILLNESS: [name] is a very pleasant 53 y.o.  female with chronic lower back pain, chronic headaches, and myofascial pain. Her pain started in 1999 in her lower back.  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  She had a spinal fusion in 2001 at the L5-S1 level.  She has had bilateral shoulder pain, neck pain, lower back pain, headaches and b/l knees.     Pt initially established with Dr. Cost in 08/2016 and then was was first evaluated by me in 07/2017. Last follow up with me was 09/07/2023.  Patient processes thoughts and feelings related to pain, recent career transitions and shifts, challenging conversations with her son, and other psychosocial factors.  Mood and anxiety are stable.  Last UTS was in 07/2023 and was appropriate. Since then has had ESI. More headaches unfortunately and that has affected concentration at work - worries about making mistakes. Also had challenging conversation with her son re how she raised him - she reflects on this and how she felt, responded, and we reviewed openness and acceptance of what came up. Spent extra time reviewing how she can adjust some rigid ways of being, working on flexibility and broad healthy ways to evaluate how she is making decisions and engaging in daily life.     OBJECTIVE / MENTAL STATUS:    Appearance:   Appears stated age and Clean/Neat   Motor:  No abnormal movements   Speech/Language:   Normal rate, volume, tone, fluency   Mood:  Irritable   Affect:  Blunted   Thought process:  Logical, linear, clear, coherent, goal directed   Thought content:    Denies SI, HI, self harm, delusions, obsessions, paranoid ideation, or ideas of reference   Perceptual disturbances:    Denies auditory and visual hallucinations, behavior not concerning for response to internal stimuli   Orientation:  Oriented to person, place, time, and general circumstances   Attention:  Able to fully attend without fluctuations in consciousness   Concentration:  Able to fully concentrate and attend   Memory:  Immediate, short-term, long-term, and recall grossly intact    Fund of knowledge:   Consistent with level of education and development   Insight:    Fair   Judgment:   Intact   Impulse Control:  Intact       DIAGNOSTIC IMPRESSION:   Anxiety NOS  Chronic continuou use of opiates    /ASSESSMENT:   [name] is a  very pleasant 53 y.o.  female from [place], [place] with chronic lower back pain, chronic . headaches, and myofascial pain. Her pain started in 1999 in her lower back.  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  She had a spinal fusion in 2001 at the L5-S1 level.  She has had bilateral shoulder pain, neck pain, lower back pain, and b/l knees.  She also described a radiculopathy b/l that goes from her back to her bilateral buttocks and down to her toes.  The patient is currently considered to be high risk due to prior nonadherence, but appropriate with a behavioral adherence plan in place.  She is shifting her career, and connects her committed action plan to values, goals and needs.  Mood and anxiety are stable, reasonably well-managed at this time.    PLAN:   (1) COM - high risk but remains appropriate with a behavioral adherence plan also in place.    -Patient MUST meet with pain psychology/me once a month.   -No additional overuse of opioids will be tolerated.    -Patient should always bring her medication to clinic for pill counts.  -Patient was informed she has had numerous infractions with respect to her pain contract over the years, and that no further infractions will be tolerated.    If any additional pain contract or behavioral adherence contract infractions occur, I recommend stopping opioids.    Pt doing well with adherence, per our last few visits.     (2) Pain coping skills- addressed compliance, as well as continued skills practice    (3) follow-in 32month

## 2023-10-21 DIAGNOSIS — R072 Precordial pain: Principal | ICD-10-CM

## 2023-10-21 MED ORDER — PANTOPRAZOLE 40 MG TABLET,DELAYED RELEASE
ORAL_TABLET | ORAL | 3 refills | 0.00000 days | Status: CP
Start: 2023-10-21 — End: ?

## 2023-10-25 ENCOUNTER — Ambulatory Visit: Admit: 2023-10-25 | Discharge: 2023-10-26 | Payer: Medicare (Managed Care)

## 2023-10-25 DIAGNOSIS — M7918 Myalgia, other site: Principal | ICD-10-CM

## 2023-10-25 DIAGNOSIS — M47816 Spondylosis without myelopathy or radiculopathy, lumbar region: Principal | ICD-10-CM

## 2023-10-25 DIAGNOSIS — M170 Bilateral primary osteoarthritis of knee: Principal | ICD-10-CM

## 2023-10-25 DIAGNOSIS — M5417 Radiculopathy, lumbosacral region: Principal | ICD-10-CM

## 2023-10-25 DIAGNOSIS — M47812 Spondylosis without myelopathy or radiculopathy, cervical region: Principal | ICD-10-CM

## 2023-10-25 MED ORDER — GABAPENTIN 800 MG TABLET
ORAL_TABLET | Freq: Every evening | ORAL | 0 refills | 90.00000 days | Status: CP
Start: 2023-10-25 — End: ?

## 2023-10-25 MED ORDER — MILNACIPRAN 25 MG TABLET
ORAL_TABLET | Freq: Every day | ORAL | 2 refills | 30.00000 days | Status: CP
Start: 2023-10-25 — End: ?

## 2023-10-25 MED ORDER — BACLOFEN 20 MG TABLET
ORAL_TABLET | ORAL | 2 refills | 0.00000 days | Status: CP
Start: 2023-10-25 — End: ?

## 2023-10-25 MED ORDER — LIDOCAINE 5 % TOPICAL OINTMENT
TOPICAL | 2 refills | 0.00000 days | Status: CP
Start: 2023-10-25 — End: ?

## 2023-10-25 NOTE — Unmapped (Signed)
 Present during controlled medication pill count.  Correct count verified.

## 2023-10-25 NOTE — Unmapped (Signed)
 Voice mail from pharmacy stating they received three new prescriptions for #10 tablets.  Is this correct?

## 2023-10-25 NOTE — Unmapped (Signed)
 Department of Anesthesiology  [facility]  [address], [address]  [place], [place] [address]  [phone]    Chronic Pain Follow Up Note  1. Lumbosacral radiculopathy    2. 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 Spondylosis without myelopathy or radiculopathy, cervical region    5. Primary osteoarthritis of both knees      Assessment and Plan  Attending: [name] is a 53 y.o. female who is being followed at the [facility] with a history of chronic pain localized to bilateral shoulder, knee as well as cervical thoracic and lumbar pain with history of L5-S1 spinal fusion and the pain is associated with bilateral radiculopathy. She was first seen in clinic in August 2018. She has a history of migraines and pseudotumor cerebri, followed by neurology and ophthalmology for treatment. Gets caudal ESI and TPIs to good effect. Compliance concerns in 2021 that led to additional oversight and then transitioning to Belbuca  in 10/2019. However, she did not tolerate belbuca  or nucynta  and was transitioned back to Percocet but with closer oversight and assistance from pain psychology. Between medications and procedures, she has been able to continue working. She now teaches water aerobics.   She's had a history of neck pain and previously responded to TPI, but more recently underwent cervical RFA.  Cervical RFA in March 2025 complicated by dural puncture and worsened lower extremity symptoms following, continuing to monitor.  Also has bilateral knee pain and follows with orthopedics and undergoes injections with steroids and HA.     September 2025  Last seen in June, returned for follow up s/p bilateral cervical RFA on 04/13/23 c/b post-procedural neuritis and dural puncture. Patient endorses similar symptoms as prior visit, but some improvement in soreness at injection site.     Today, the patient presents for regular chronic pain follow up.     Lumbosacral radiculopathy   Improved, s/p caudal ESI on 08/29/2023. 70% ongoing pain relief, especially for buttock throbbing. Also reports improved radicular pain in the left lower extremity since initiation of Savella  25 mg daily. Gabapentin  causes drowsiness, especially on workdays, and is taken only at night and would like to take only at night.  - Continue Savella  25 mg daily.  - Decrease gabapentin  to 800 mg at night, If pain worsens with this change and she needs to return to the previous dose, she will contact us .  - Change lidocaine  patch to ointment.  - Refill medications for three months.  - Continue Voltaren  gel  - Nortriptyline  50 mg at bedtime per Neurology (patient not taking)    Post-procedural neuritis   - Decrease gabapentin  to 800 mg at night.  - Continue oxycodone  prescription to allow dosing every four hours as needed for pain management given the increase in pain and to allow her to continue to be active, working, and taking care of her nephew.    - Encourage use of heat, ice, and stretching exercises to alleviate symptoms.    - Continue lidocaine  patches, Voltaren  gel    Lumbar fusion    Lumbar fusion relevant to chronic pain management. No indication of acute issues related to lumbar fusion at this time.    Cervicalgia  Unchanged; s/p cervical RFA with likely neuritis and some effects related to dural puncture. She reports that she tried laser treatment at chiropractic, but it worsened her pain, so she discontinued  it. Reports benefit from lidocaine  patches, although patches do not adhere well to the neck. Changed to ointment form to improve adherence and effectiveness.  - Change lidocaine  patch to ointment.  - Use over-the-counter ointment if insurance does not cover prescription.  - Continue use of heat, ice, and stretching exercises to alleviate symptoms.      Myofascial pain syndrome  She states she benefited after the most recent cervical TPIs, currently getting about 60% relief, FMAS score improved  - Continue PT/HEP  - Continue Baclofen  20 mg TID, max dose  - Repeat cervical TPIs PRN after 04/2023; FMAS 10 at last visit, 16 day of procedure    Psychological stress related to caregiving  Increased stress due to caregiving responsibilities for her sick father, leading to heightened pain at times. Continues to find support from family and benefits from sessions with pain psychologist Dr. Darrow.  - Continue follow-up with pain psychologist Dr. Darrow.    Chronic pain syndrome   Stable.  - Continue Percocet 10 mg every four hours, maximum six pills per day. [place] and having some Percocet in her pill organizer at home. Advised to bring all her opioids on each visit.   - UDS up to date  - Patient has been exercising with benefit.  - Continue current pain management regimen including baclofen , Voltaren  gel, Percocet as needed, and start savella     - Monitor and adjust medications as necessary based on symptom control and side effects.       Last pain medication agreement on file and signed on 12/27/22  Last urine toxicology: 12/27/22.  Appropriate.  NCCSRS database was reviewed 10/25/23 and is appropriate:    COMM: 4 point on 10/25/2023   Last Opioid Change: Percocet 10-325 decreased from 5x daily PRN to 4x daily PRN 05/2016, 01/2018-increase to #132, several changes in Fall 2021 due to compliance concerns, 11/2019-back to percocet #90, 05/2020-back to #120, 04/2023-Up to #180, 06/2023-back to #120, 07/2023-back to #180  Previous Compliance Issues: Did not bring medications 10/2016, no show 04/2017, overtaking 06/2017, short on 09/27/17, lost Rx 10/2017, 01/2018-too late to be seen, 04/2019- 4 days short, but reports some tabs in med box at home. 07/2019 - short, states that she left 28 pills at home and 5 in her car. 08/2019- short, says she overtook during acute increase in pain in setting of acute sinusitis/strep throat. 09/2019- short, says she is unsure why her pill count is low and denies overuse. 10/2019 Did not bring Belbuca  to appointment, 11/28/2019 - Did not bring Percocet for pill count, 06/2021 - Did not bring Percocet  Oral Morphine Equivalents: 60-90  On a Benzodiazepine: no   Naloxone  last Ordered:  04/2023  NCCSRS database was reviewed 10/25/23.      Return in about 3 months (around 01/24/2024).    Future Considerations:  Repeat caudal ESI  Ensure continued f/u with Dr. Garrel    Requested Prescriptions     Signed Prescriptions Disp Refills    gabapentin  (NEURONTIN ) 800 MG tablet 90 tablet 0     Sig: Take 1 tablet (800 mg total) by mouth nightly.    baclofen  (LIORESAL ) 20 MG tablet 90 tablet 2     Sig: 10 - 20 mg three times a day as needed for spasms.    milnacipran  (SAVELLA ) 25 mg Tab 30 tablet 2     Sig: Take 1 tablet (25 mg total) by mouth in the morning.    lidocaine  (  XYLOCAINE ) 5 % ointment 200 g 2     Sig: Apply 2-4 grams(1-2 inch ribbon) to affected areas up to three times a day as needed for pain.    oxyCODONE -acetaminophen  (PERCOCET) 10-325 mg per tablet 180 tablet 0     Sig: Take 1 tablet by mouth every four (4) hours as needed for pain. Max 6 tabs/day. OK to fill 11/08/23    oxyCODONE -acetaminophen  (PERCOCET) 10-325 mg per tablet 180 tablet 0     Sig: Take 1 tablet by mouth every four (4) hours as needed for pain. Max 6 tabs/day. OK to fill 12/08/23    oxyCODONE -acetaminophen  (PERCOCET) 10-325 mg per tablet 180 tablet 0     Sig: Take 1 tablet by mouth every four (4) hours as needed for pain. Max 6 tabs/day. OK to fill 01/07/24     No orders of the defined types were placed in this encounter.        HPI  Shon Mansouri Popson is a 53 y.o. being followed at [facility] for complaint of chronic pain localized to her neck, upper back, lower back, bilateral hips and knees.      History of Present Illness  Kathi Flurry Dethlefs is a 53 year old female who presents for regular chronic pain management follow-up.    She continues to take Percocet 10 mg every four hours, with a maximum of six pills per day with benefit. On her days off, she takes about five pills per day, but usually takes six pills daily.    She experiences significant benefit from the initiation of Savella , taking 25 mg every morning, and denies any side effects. She finds Savella  more helpful than gabapentin , which she takes 400/400/800 mg per day, but mostly takes 800 mg at night due to drowsiness.    She reports that she tried laser treatment at chiropractic for neck pain, but it worsened her pain, so she discontinued it. She uses a lidocaine  patch, which provides great benefit, although it does not stay on her neck well.    No new symptoms or side effects from her medications.    She reports a 70% ongoing pain relief from a caudal ESI injection received in August, particularly for buttock throbbing, which has significantly improved. She also notes improvement in radicular pain that previously extended to the left lower extremity.    She mentions increased stress due to her father's illness, which has heightened her pain at times. She finds support from her family and continues to follow up with our pain psychologist, which she finds helpful.    Patients current medication regimen:  - Percocet 10-325 mg q4 prn   - Gabapentin  400/400/800 mg (on days she is not working); 1600 mg at bedtime on days she is working  - Voltaren  gel-using regularly  - Lidocaine  patch OTC  - Baclofen  20mg  TID  - Nortriptyline  50 mg at bedtime per neurology (not taking, d/t drowsiness)    Allergies  Allergies   Allergen Reactions    Buprenorphine  Hcl Itching and Swelling    Pregabalin Swelling    Cephalexin  Monohydrate     Doxycycline      Oxycodone  Hcl     Adhesive Tape-Silicones Itching     Band-aids ok.    Doxycycline  Hyclate (Bulk) Nausea And Vomiting     GI Upset    Keflex  [Cephalexin ] Rash    Opioids - Morphine Analogues Itching Oxycodone -Acetaminophen  Itching and Nausea And Vomiting     GI Upset- GENERIC ONLY-  able to take the brand name Percocets     Home Medications    Current Outpatient Medications   Medication Sig Dispense Refill    albuterol  HFA 90 mcg/actuation inhaler Inhale 2 puffs every six (6) hours as needed for wheezing or shortness of breath. 54 g 0    azelastine  (ASTELIN ) 137 mcg (0.1 %) nasal spray 2 sprays into each nostril two (2) times a day. 30 mL 5    azelastine  (OPTIVAR ) 0.05 % ophthalmic solution Administer 2 drops to both eyes daily as needed. 6 mL 5    baclofen  (LIORESAL ) 20 MG tablet 10 - 20 mg three times a day as needed for spasms. 90 tablet 2    diclofenac  sodium 20 mg/gram /actuation(2 %) sopm Apply 40 mg topically two (2) times a day. 100 g 0    dicyclomine  (BENTYL ) 10 mg/mL injection inject 2 milliliter by intramuscular route 4 times every day      empty container Misc Use as directed to dispose of Xolair  syringes 1 each 2    EPINEPHrine  (EPIPEN ) 0.3 mg/0.3 mL injection Inject 0.3 mL (0.3 mg total) into the muscle once as needed for anaphylaxis (Difficulty breathing, throat closing, etc) for up to 1 dose. 1 each 12    erythromycin (ROMYCIN) 5 mg/gram (0.5 %) ophthalmic ointment 1 application. (1 Application total).      estradiol  (ESTRACE ) 1 MG tablet Take 1 tablet (1 mg total) by mouth daily. 30 tablet 11    famotidine  (PEPCID ) 20 MG tablet Take 1 tablet (20 mg total) by mouth two (2) times a day as needed for heartburn. 60 tablet 1    gabapentin  (NEURONTIN ) 800 MG tablet Take 1 tablet (800 mg total) by mouth two (2) times a day. 180 tablet 0    hydroxychloroquine  (PLAQUENIL ) 200 mg tablet Take 1 tablet (200 mg total) by mouth two (2) times a day. 60 tablet 12    ipratropium (ATROVENT ) 21 mcg (0.03 %) nasal spray 2 sprays into each nostril four (4) times a day. 30 mL 1    lidocaine  (LIDODERM ) 5 % patch Place 1 patch on the skin daily. Apply up to 1 patches at a time.  Use for up to 12 hours/day. 30 patch 2 milnacipran  (SAVELLA ) 12.5 mg Tab tablet Take 1 tablet in the morning for 1-2 weeks.  If tolerated, increase to 2 tablets in the morning and continue. 60 tablet 2    naloxone  (NARCAN ) 4 mg nasal spray 1 spray into alternating nostrils once as needed (opioid overdose.). One spray in either nostril once for known/suspected opioid overdose. May repeat every 2-3 minutes in alternating nostril til EMS arrives 2 each 2    omalizumab  (XOLAIR ) 150 mg/mL syringe Inject 2 syringes (300 mg total) under the skin every fourteen (14) days. 4 mL 11    oxyCODONE -acetaminophen  (PERCOCET) 10-325 mg per tablet Take 1 tablet by mouth every four (4) hours as needed for pain. Max 6 tabs/day. Fill on or after: 10/09/23 180 tablet 0    oxyCODONE -acetaminophen  (PERCOCET) 10-325 mg per tablet Take 1 tablet by mouth every four (4) hours as needed for pain. Max 6 tabs/day. Fill on or after: 08/10/23 (date moved up d/t frequency change) 180 tablet 0    oxyCODONE -acetaminophen  (PERCOCET) 10-325 mg per tablet Take 1 tablet by mouth every four (4) hours as needed for pain. Max 6 tabs/day. OK to fill 09/28/23 due to short August prescription. 10 tablet 0    pantoprazole  (PROTONIX ) 40 MG tablet TAKE  1 TABLET(40 MG) BY MOUTH DAILY 30 MINUTES BEFORE BREAKFAST AS NEEDED 90 tablet 3    potassium chloride  20 mEq TbER TAKE 1 TABLET BY MOUTH DAILY 90 tablet 2    progesterone  (PROMETRIUM ) 100 MG capsule Take 1 capsule (100 mg total) by mouth daily. 90 capsule prn    rosuvastatin  (CRESTOR ) 10 MG tablet Take 1 tablet (10 mg total) by mouth nightly. 90 tablet 3    sumatriptan  (IMITREX ) 100 MG tablet Take 1 tablet (100 mg total) by mouth.      UBRELVY  50 mg tablet TAKE 1 TABLET BY MOUTH FOR HEADACHE. MAY REPEAT IN 2 HOURS IF NEEDED. LIMIT 2 TABLETS PER DAY AND NO MORE THAN 4 TABLETS PER WEEK 16 tablet 3    XHANCE  93 mcg/actuation AerB 1 spray into each nostril two (2) times a day as needed. 16 mL 11     No current facility-administered medications for this visit.     Previous Medication Trials:  NSAIDS- celebrex , ibuprofen , naproxen ,   Antidepressants-   Anticonvulsant- gabapentin , pregabalin,   Muscle relaxants- baclofen , flexeril , valium, skelaxin, robaxin, tizanidine   Topicals-voltaren  gel, lidoderm ,   Short-acting opiates-hydromorphone , percocet, oxycodone , tramadol, ultracet, vicodin, nucynta  IR  Long-acting opiates- fentanyl , morphine, methadone, oxycontin . Sedation with long acting opioids. Belbuca  (side effects)  Anxiolytics-   Other-     Previous Interventions  PT/Aquatic therapy/TENS/Meidcation/Epidural injections/Nerve Block/ Other injections/Surgery/Psychological Counseling  Before our clinic:  - L3-5 MBBB 11/2015 with good benefit; repeat did not help per patient  - B/l trochanteric injections 10/2014  - TPI 11/2015 upper neck to lower back  - B/l knee injections 2017  - Caudal epidural steroid injection (unsure of timing) with improvement in radiculopathy symptoms  Previous tests include MRI, XRAYs, CT scan and NCS/EMG  [facility]  Caudal ESI 10/20/16, 01/2017, 07/14/17, 10/27/17, 03/22/18, 11/16/18, 05/25/19,11/26/19  Knee injection-[place] 10/22/16  TPI 10/28/16, 01/2017, 07/14/17, 08/11/17, 10/27/17, 03/22/18, 12/17/19  Lumbar L2, L3, L4 MBB - >80% relief x2 in October 2022  Lumbar L2, L3, L4 RFA - 12/10/20 - initial flare-likely neuritis. Can assess efficacy longer term.  PT-07/2018  Bilateral knee IA CSI-Sports med 02/16/19  Trigger point injections 03/05/20 and 04/21/20, 05/30/20, 07/09/20, 08/21/20, 09/25/20, 11/05/20  Caudal ESI 03/19/20, 08/11/20, 08/27/21, 08/29/23  TENS unit-ordered 05/2020  12/24/21-bilateral GTB CST ortho  11/26/21-ortho knee CSI  11/19/21-ortho knee CSI  Cervical RFA 05/11/22  TPI 08/31/22  PT referral 08/2022  Cervical RFA 04/13/23-dural puncture, neuritis  Ortho 07/25/23-HA series bilateral knees,   Ortho 06/27/23-bilateral knee CSI,     Imaging/Labs:  Lab Results   Component Value Date    PLT 233 05/20/2023     Lab Results   Component Value Date CREATININE 0.62 07/05/2023     No results found for: PT, INR  No results found for: APTT  No results found for: A1C  Lab Results   Component Value Date    ALKPHOS 47 07/05/2023    BILITOT 0.4 07/05/2023    PROT 7.3 07/05/2023    ALBUMIN 3.8 07/05/2023    ALT 39 07/05/2023    AST 31 07/05/2023       Review Of Systems  See questionnaires    Physical Exam    VITALS:   Vitals:    10/25/23 1233   BP: 158/93   Pulse: 78   SpO2: 94%     Wt Readings from Last 3 Encounters:   10/03/23 75.3 kg (166 lb)   08/29/23 75.5 kg (166 lb 6.4 oz)  08/17/23 74.2 kg (163 lb 9.6 oz)     GENERAL:  The patient is well developed, well-nourished, and appears to be in no apparent distress.   HEAD/NECK:    Normocephalic/atraumatic. clear sclera, pupils not pinpoint  CV:  Warm and well perfused.   LUNGS:   Normal work of breathing, no supplemental O2  EXTREMITIES:  No clubbing, cyanosis noted.  NEUROLOGIC:    The patient is alert and oriented, speech fluent, normal language.   MUSCULOSKELETAL:    Motor function  preserved.   GAIT:  The patient rises from a seated position with no difficulty and ambulates with nonantalgic gait without the assistance of a walking aid.   SKIN:   No obvious rashes, lesions, or erythema.  PSY:   Appropriate affect. No overt pain behaviors. No evidence of psychomotor retardation or agitation, no signs of intoxication.     We are delivering comprehensive, continuous, longitudinal care for this patient with chronic pain.

## 2023-10-25 NOTE — Unmapped (Signed)
 Medication:OXYCODONE  10-325  DPH:UJXZ 1 Q 4 HRS PRN FOR PAIN   Quantity on RX: #180  Filled on:10/04/2023  Pill count today: #80

## 2023-10-25 NOTE — Unmapped (Signed)
 It was good to see you today.    I have refilled your medications with no changes.    We will see you in 3 months, or sooner if needed.

## 2023-10-25 NOTE — Unmapped (Signed)
 Pt declined printed AVS let them know their AVS can be viewed through their My Chart.  Pt voiced understanding

## 2023-10-25 NOTE — Unmapped (Signed)
 Called pharmacy to verify they received 3 new percocet Rx for #180.  Spoke with pharmacy tech, Lonni who confirmed receipt and cancelled the 3 incorrect #10 tablets prescriptions.

## 2023-11-07 DIAGNOSIS — M170 Bilateral primary osteoarthritis of knee: Principal | ICD-10-CM

## 2023-11-07 MED ORDER — DICLOFENAC 20 MG/GRAM/ACTUATION (2 %) TOPICAL SOLN METERED-DOSE PUMP
TOPICAL | 0 refills | 0.00000 days | Status: CP
Start: 2023-11-07 — End: ?

## 2023-11-08 MED ORDER — OXYCODONE-ACETAMINOPHEN 10 MG-325 MG TABLET
ORAL_TABLET | ORAL | 0 refills | 30.00000 days | Status: CP | PRN
Start: 2023-11-08 — End: 2023-10-25

## 2023-11-16 NOTE — Unmapped (Signed)
 [facility] Refill Coordination Note    Natalie [name], [place]: 1970/12/21  Phone: [phone] (home)       All above HIPAA information was verified with patient.         11/16/2023     9:47 AM   Specialty Rx Medication Refill Questionnaire   Which Medications would you like refilled and shipped? Xolair    Please list all current allergies: In the chart   Have you missed any doses in the last 30 days? No   Have you had any changes to your medication(s) since your last refill? No   How much of each medication do you have remaining at home? (eg. number of tablets, injections, etc.) 0   If receiving an injectable medication, next injection date is 11/18/2023   Have you experienced any side effects in the last 30 days? No   Please enter the full address (street address, city, state, zip code) where you would like your medication(s) to be delivered to. [address], [place] [address]   Please specify on which day you would like your medication(s) to arrive. Note: if you need your medication(s) within 3 days, please call the pharmacy to schedule your order at [phone]  11/18/2023   Has your insurance changed since your last refill? No   Would you like a pharmacist to call you to discuss your medication(s)? No   Do you require a signature for your package? (Note: if we are billing Medicare Part B or your order contains a controlled substance, we will require a signature) No   I have been provided my out of pocket cost for my medication and approve the pharmacy to charge the amount to my credit card on file. Yes         Completed refill call assessment today to schedule patient's medication shipment from the [facility] [phone]).  All relevant notes have been reviewed.       Confirmed patient received a Conservation officer, historic buildings and a Surveyor, mining with first shipment. The patient will receive a drug information handout for each medication shipped and additional FDA Medication Guides as required.         REFERRAL TO PHARMACIST     Referral to the pharmacist: Not needed      [facility]     Shipping address confirmed in Epic.     Delivery Scheduled: Yes, Expected medication delivery date: 11/18/23.     Medication will be delivered via Same Day Courier to the prescription address in Epic WAM.    Natalie [name]   [facility] Specialty Technician

## 2023-11-18 MED FILL — XOLAIR 150 MG/ML SUBCUTANEOUS SYRINGE: SUBCUTANEOUS | 28 days supply | Qty: 4 | Fill #1

## 2023-11-23 DIAGNOSIS — M5417 Radiculopathy, lumbosacral region: Principal | ICD-10-CM

## 2023-11-23 MED ORDER — LIDOCAINE 5 % TOPICAL PATCH
MEDICATED_PATCH | TOPICAL | 2 refills | 0.00000 days | Status: CP
Start: 2023-11-23 — End: ?

## 2023-11-25 ENCOUNTER — Encounter
Admit: 2023-11-25 | Discharge: 2023-11-26 | Payer: Medicare (Managed Care) | Attending: Psychologist | Primary: Psychologist

## 2023-11-25 DIAGNOSIS — F1190 Opioid use, unspecified, uncomplicated: Principal | ICD-10-CM

## 2023-11-25 DIAGNOSIS — F4321 Adjustment disorder with depressed mood: Principal | ICD-10-CM

## 2023-11-25 DIAGNOSIS — F419 Anxiety disorder, unspecified: Principal | ICD-10-CM

## 2023-11-25 NOTE — Progress Notes (Signed)
 [facility]   Confidential Psychological Therapy Session    Patient Name: Natalie [name]  Medical Record Number: [id number]  Date of Service: November 25, 2023  Attending Psychologist: Greig Holland, PhD  CPT Procedure Code: 09167 for  minutes of face to face counseling    This visit was attempted via face to face with interactive technology using a HIPPA compliant audio/visual platform. Patient unable to connect today (often she can, but had technical difficulties today) so we completed the visit via phone (audio only). We reviewed confidentiality today. The patient was present in  , a state in which this provider is licensed and able to provide care (location and contact information confirmed), attended this visit alone, and consented to this virtual pain psychology visit.    REFERRING PHYSICIAN: Prentice Cost, MD    CHIEF COMPLAINT AND REASON FOR VISIT:  COM follow up evaluation/pain coping skills; CBT/ACT for pain; grief    SUBJECTIVE / HISTORY OF PRESENT ILLNESS: [name] is a very pleasant 53 y.o.  female with chronic lower back pain, chronic headaches, and myofascial pain. Her pain started in 1999 in her lower back.  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  She had a spinal fusion in 2001 at the L5-S1 level.  She has had bilateral shoulder pain, neck pain, lower back pain, headaches and b/l knees.     Pt initially established with Dr. Cost in 08/2016 and then was was first evaluated by me in 07/2017. Last follow up with me was 10/19/2023.  Patient processes thoughts and feelings related to pain, recent other psychosocial factors.  Is in court today, and we ended our visit short, due to the upcoming hearing starting.  Patient is not in court for her self, supporting family.  Patient shares mood has remained positive and she is trying to stay active, focusing on job and goal transitions during this time.  Reflected on how sometimes we can go through times in her life that feel less stable, but ultimately make some changes in the direction we are headed, that ultimately serves as better in the end.  We briefly reviewed values as a method to evaluate decision making and direction in her lives. Mood and anxiety are stable.  Last UTS was in 07/2023 and was appropriate.     OBJECTIVE / MENTAL STATUS:    Appearance:   Appears stated age and Clean/Neat   Motor:  No abnormal movements   Speech/Language:   Normal rate, volume, tone, fluency   Mood:  Irritable   Affect:  Blunted   Thought process:  Logical, linear, clear, coherent, goal directed   Thought content:    Denies SI, HI, self harm, delusions, obsessions, paranoid ideation, or ideas of reference   Perceptual disturbances:    Denies auditory and visual hallucinations, behavior not concerning for response to internal stimuli   Orientation:  Oriented to person, place, time, and general circumstances   Attention:  Able to fully attend without fluctuations in consciousness   Concentration:  Able to fully concentrate and attend   Memory:  Immediate, short-term, long-term, and recall grossly intact    Fund of knowledge:   Consistent with level of education and development   Insight:    Fair   Judgment:   Intact   Impulse Control:  Intact       DIAGNOSTIC IMPRESSION:   Anxiety NOS  Chronic continuou use of opiates    /  ASSESSMENT:   [name] is a very pleasant 53 y.o.  female from [place], [place] with chronic lower back pain, chronic . headaches, and myofascial pain. Her pain started in 1999 in her lower back.  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  She had a spinal fusion in 2001 at the L5-S1 level.  She has had bilateral shoulder pain, neck pain, lower back pain, and b/l knees.  She also described a radiculopathy b/l that goes from her back to her bilateral buttocks and down to her toes.  The patient is currently considered to be high risk due to prior nonadherence, but appropriate with a behavioral adherence plan in place.  She is shifting her career, and connects her committed action plan to values, goals and needs.  Mood and anxiety are stable, reasonably well-managed at this time.    PLAN:   (1) COM - high risk but remains appropriate with a behavioral adherence plan also in place.    -Patient MUST meet with pain psychology/me once a month.   -No additional overuse of opioids will be tolerated.    -Patient should always bring her medication to clinic for pill counts.  -Patient was informed she has had numerous infractions with respect to her pain contract over the years, and that no further infractions will be tolerated.    If any additional pain contract or behavioral adherence contract infractions occur, I recommend stopping opioids.    Pt doing well with adherence, per our last few visits.     (2) Pain coping skills- addressed compliance, as well as continued skills practice    (3) follow-in 41month

## 2023-11-28 ENCOUNTER — Ambulatory Visit: Admit: 2023-11-28 | Discharge: 2023-11-29 | Payer: Medicare (Managed Care)

## 2023-11-28 DIAGNOSIS — M170 Bilateral primary osteoarthritis of knee: Principal | ICD-10-CM

## 2023-11-28 MED ADMIN — triamcinolone acetonide (KENALOG-40) injection 40 mg: 40 mg | INTRA_ARTICULAR | @ 21:00:00 | Stop: 2023-11-28

## 2023-11-28 MED ADMIN — triamcinolone acetonide (KENALOG-40) injection 20 mg: 20 mg | INTRA_ARTICULAR | @ 21:00:00 | Stop: 2023-11-28

## 2023-11-28 MED ADMIN — bupivacaine HCl (MARCAINE) 0.5 % (5 mg/mL) injection 25 mg: 5 mL | @ 21:00:00 | Stop: 2023-11-28

## 2023-11-28 NOTE — Telephone Encounter (Signed)
 K[facility], can you schedule repeat visco for this patient - bilateral knees? Thank you
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
## 2023-11-29 NOTE — Telephone Encounter (Signed)
 Submitted for insurance benefits verification, no auth required , will contact patient to discuss/schedule once insurance benefits/auth obtained.

## 2023-12-01 NOTE — Telephone Encounter (Signed)
 LMTCB to discuss and schedule No Auth required . KH

## 2023-12-02 ENCOUNTER — Encounter: Admit: 2023-12-02 | Discharge: 2023-12-03 | Payer: Medicare (Managed Care)

## 2023-12-02 DIAGNOSIS — L282 Other prurigo: Principal | ICD-10-CM

## 2023-12-02 DIAGNOSIS — B9689 Other specified bacterial agents as the cause of diseases classified elsewhere: Principal | ICD-10-CM

## 2023-12-02 DIAGNOSIS — N760 Acute vaginitis: Principal | ICD-10-CM

## 2023-12-02 DIAGNOSIS — Z207 Contact with and (suspected) exposure to pediculosis, acariasis and other infestations: Principal | ICD-10-CM

## 2023-12-02 MED ORDER — IVERMECTIN 3 MG TABLET
ORAL_TABLET | Freq: Once | ORAL | 0 refills | 1.00000 days | Status: CP
Start: 2023-12-02 — End: 2023-12-02

## 2023-12-02 MED ORDER — METRONIDAZOLE 500 MG TABLET
ORAL_TABLET | Freq: Two times a day (BID) | ORAL | 0 refills | 7.00000 days | Status: CP
Start: 2023-12-02 — End: 2023-12-09

## 2023-12-02 NOTE — Telephone Encounter (Signed)
 Had video visit, and sent in ivermectin, as she reports having been treated with topical permethrin cream, but continues to have itching rash (non crusted) in lower legs.

## 2023-12-02 NOTE — Telephone Encounter (Signed)
 Benefits verification obtained, Auth obtained  . Patient called, discussed and scheduled.
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
## 2023-12-08 MED ORDER — OXYCODONE-ACETAMINOPHEN 10 MG-325 MG TABLET
ORAL_TABLET | ORAL | 0 refills | 30.00000 days | Status: CP | PRN
Start: 2023-12-08 — End: 2023-10-25

## 2023-12-12 NOTE — Progress Notes (Signed)
 [facility] Refill Coordination Note    Natalie [name], [place]: 1970-02-15  Phone: [phone] (home)       All above HIPAA information was verified with patient.         12/10/2023     9:10 AM   Specialty Rx Medication Refill Questionnaire   Which Medications would you like refilled and shipped? Xolair    Please list all current allergies: Record   Have you missed any doses in the last 30 days? No   Have you had any changes to your medication(s) since your last refill? No   How much of each medication do you have remaining at home? (eg. number of tablets, injections, etc.) None   If receiving an injectable medication, next injection date is 12/16/2023   Have you experienced any side effects in the last 30 days? No   Please enter the full address (street address, city, state, zip code) where you would like your medication(s) to be delivered to. [address] [name] [address]   Please specify on which day you would like your medication(s) to arrive. Note: if you need your medication(s) within 3 days, please call the pharmacy to schedule your order at [phone]  12/13/2023   Has your insurance changed since your last refill? No   Would you like a pharmacist to call you to discuss your medication(s)? No   Do you require a signature for your package? (Note: if we are billing Medicare Part B or your order contains a controlled substance, we will require a signature) No   I have been provided my out of pocket cost for my medication and approve the pharmacy to charge the amount to my credit card on file. Yes         Completed refill call assessment today to schedule patient's medication shipment from the [facility] [phone]).  All relevant notes have been reviewed.       Confirmed patient received a Conservation Officer, Historic Buildings and a Surveyor, Mining with first shipment. The patient will receive a drug information handout for each medication shipped and additional FDA Medication Guides as required.         REFERRAL TO PHARMACIST     Referral to the pharmacist: Not needed      [facility]     Shipping address confirmed in Epic.     Delivery Scheduled: Yes, Expected medication delivery date: 12/13/23.     Medication will be delivered via Same Day Courier to the prescription address in Epic WAM.    Natalie [name]   [facility] Specialty Technician

## 2023-12-13 MED FILL — XOLAIR 150 MG/ML SUBCUTANEOUS SYRINGE: SUBCUTANEOUS | 28 days supply | Qty: 4 | Fill #2

## 2023-12-25 MED ORDER — PROGESTERONE MICRONIZED 100 MG CAPSULE
ORAL_CAPSULE | prn refills | 0.00000 days
Start: 2023-12-25 — End: ?

## 2023-12-25 MED ORDER — ESTRADIOL 1 MG TABLET
ORAL_TABLET | Freq: Every day | ORAL | 0.00000 days
Start: 2023-12-25 — End: ?

## 2023-12-26 ENCOUNTER — Encounter
Admit: 2023-12-26 | Discharge: 2023-12-27 | Payer: Medicare (Managed Care) | Attending: Psychologist | Primary: Psychologist

## 2023-12-26 DIAGNOSIS — F419 Anxiety disorder, unspecified: Principal | ICD-10-CM

## 2023-12-26 DIAGNOSIS — F4321 Adjustment disorder with depressed mood: Principal | ICD-10-CM

## 2023-12-26 DIAGNOSIS — F1190 Opioid use, unspecified, uncomplicated: Principal | ICD-10-CM

## 2023-12-26 MED ORDER — ESTRADIOL 1 MG TABLET
ORAL_TABLET | Freq: Every day | ORAL | 90.00000 days
Start: 2023-12-26 — End: ?

## 2023-12-26 MED ORDER — PROGESTERONE MICRONIZED 100 MG CAPSULE
ORAL_CAPSULE | prn refills | 0.00000 days
Start: 2023-12-26 — End: ?

## 2023-12-26 NOTE — Telephone Encounter (Signed)
 Please schedule your yearly appointment for refill/annual exam. Thank you

## 2023-12-26 NOTE — Progress Notes (Signed)
 [facility]   Confidential Psychological Therapy Session    Patient Name: Natalie [name]  Medical Record Number: [id number]  Date of Service: December 26, 2023  Attending Psychologist: Greig Holland, PhD  CPT Procedure Code: 09162 for 60 minutes of face to face counseling    This visit was attempted via face to face with interactive technology using a HIPPA compliant audio/visual platform. Patient unable to connect today (often she can, but had technical difficulties today) so we completed the visit via phone (audio only). We reviewed confidentiality today. The patient was present in [place] , a state in which this provider is licensed and able to provide care (location and contact information confirmed), attended this visit alone, and consented to this virtual pain psychology visit.    REFERRING PHYSICIAN: Prentice Cost, MD    CHIEF COMPLAINT AND REASON FOR VISIT:  COM follow up evaluation/pain coping skills; CBT/ACT for pain; grief    SUBJECTIVE / HISTORY OF PRESENT ILLNESS: [name] is a very pleasant 53 y.o.  female with chronic lower back pain, chronic headaches, and myofascial pain. Her pain started in 1999 in her lower back.  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  She had a spinal fusion in 2001 at the L5-S1 level.  She has had bilateral shoulder pain, neck pain, lower back pain, headaches and b/l knees.     Pt initially established with Dr. Cost in 08/2016 and then was was first evaluated by me in 07/2017. Last follow up with me was 11/25/2023.  Patient processes thoughts and feelings related to pain, recent other psychosocial factors including to help others, niece needing help with car, desire to find work that is more flexible.  Patient shares mood has remained positive and she is trying to stay active, focusing on job and goal transitions during this time. Has had increased anxiety related to her boyfriend and conflict and how that impacts blood pressure.  Practiced somatic quieting.  Mood and anxiety are stable.  Last UTS was in 07/2023 and was appropriate. Reports good compliance. Let pain still present but working on making functional and practical accommodations to stay active.    OBJECTIVE / MENTAL STATUS:    Appearance:   Appears stated age and Clean/Neat   Motor:  No abnormal movements   Speech/Language:   Normal rate, volume, tone, fluency   Mood:  Irritable   Affect:  Blunted   Thought process:  Logical, linear, clear, coherent, goal directed   Thought content:    Denies SI, HI, self harm, delusions, obsessions, paranoid ideation, or ideas of reference   Perceptual disturbances:    Denies auditory and visual hallucinations, behavior not concerning for response to internal stimuli   Orientation:  Oriented to person, place, time, and general circumstances   Attention:  Able to fully attend without fluctuations in consciousness   Concentration:  Able to fully concentrate and attend   Memory:  Immediate, short-term, long-term, and recall grossly intact    Fund of knowledge:   Consistent with level of education and development   Insight:    Fair   Judgment:   Intact   Impulse Control:  Intact       DIAGNOSTIC IMPRESSION:   Anxiety NOS  Chronic continuou use of opiates    /ASSESSMENT:   [name] is a very pleasant 53 y.o.  female from [place], [place] with chronic lower back pain, chronic . headaches,  and myofascial pain. Her pain started in 1999 in her lower back.  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  She had a spinal fusion in 2001 at the L5-S1 level.  She has had bilateral shoulder pain, neck pain, lower back pain, and b/l knees.  She also described a radiculopathy b/l that goes from her back to her bilateral buttocks and down to her toes.  The patient is currently considered to be high risk due to prior nonadherence, but appropriate with a behavioral adherence plan in place.  She is shifting her career, and connects her committed action plan to values, goals and needs.  Mood and anxiety are stable, reasonably well-managed at this time.    PLAN:   (1) COM - high risk but remains appropriate with a behavioral adherence plan also in place.    -Patient MUST meet with pain psychology/me once a month.   -No additional overuse of opioids will be tolerated.    -Patient should always bring her medication to clinic for pill counts.  -Patient was informed she has had numerous infractions with respect to her pain contract over the years, and that no further infractions will be tolerated.    If any additional pain contract or behavioral adherence contract infractions occur, I recommend stopping opioids.    Pt doing well with adherence, per our last few visits.     (2) Pain coping skills- addressed compliance, as well as continued skills practice    (3) follow-in 30month

## 2023-12-29 DIAGNOSIS — J3089 Other allergic rhinitis: Principal | ICD-10-CM

## 2023-12-29 MED ORDER — XHANCE 93 MCG/ACTUATION BREATH ACTIVATED AEROSOL
ORAL | 11 refills | 0.00000 days | Status: CP
Start: 2023-12-29 — End: ?

## 2023-12-29 MED ORDER — SAVELLA 25 MG TABLET
ORAL_TABLET | 2 refills | 0.00000 days
Start: 2023-12-29 — End: ?

## 2023-12-29 NOTE — Telephone Encounter (Signed)
 Unable to complete refill request, medication is not included in the refill protocol.   Please review below request.     Patient is requesting the following refill  Requested Prescriptions     Pending Prescriptions Disp Refills    XHANCE  93 mcg/actuation AerB [Pharmacy Med Name: XHANCE  NASAL SPRAY(120SP)] 16 mL 11     Sig: USE 1 SPRAY INTO EACH NOSTRIL TWICE DAILY AS NEEDED       Recent Visits  Date Type Provider Dept   12/02/23 Telemedicine Neelagiri, Montel, MD [facility]   10/03/23 Office Visit Neelagiri, Montel, MD [facility]   08/29/23 Office Visit Neelagiri, Montel, MD [facility]   07/05/23 Office Visit Neelagiri, Montel, MD [facility]   06/13/23 Telemedicine Neelagiri, Montel, MD [facility]   05/18/23 Office Visit Neelagiri, Montel, MD [facility]   02/23/23 Office Visit Neelagiri, Montel, MD [facility]   Showing recent visits within past 365 days and meeting all other requirements  Future Appointments  No visits were found meeting these conditions.  Showing future appointments within next 365 days and meeting all other requirements       Labs: Not applicable this refill

## 2023-12-30 NOTE — Telephone Encounter (Signed)
 Refill request received for patient.      Medication Requested: Savella  25 mg  Last Office Visit: 12/26/2023   Next Office Visit: 01/24/2024  Last Prescriber: Dola Mebrahtu    Please refill if appropriate

## 2024-01-02 MED ORDER — SAVELLA 25 MG TABLET
ORAL_TABLET | 2 refills | 0.00000 days
Start: 2024-01-02 — End: ?

## 2024-01-02 NOTE — Telephone Encounter (Signed)
 Patient was given xr refill at last OV on 9/30, she should have enough pills till next appointment on 12/30. PDMD reviewed, had one more refill after 10/28 refill, which was dispensed on 12/2.

## 2024-01-03 NOTE — Progress Notes (Signed)
 [facility] Refill Coordination Note    Natalie [name], [place]: 05/21/1970  Phone: [phone] (home)       All above HIPAA information was verified with patient.         01/03/2024     3:25 PM   Specialty Rx Medication Refill Questionnaire   Which Medications would you like refilled and shipped? 0   Please list all current allergies: 0   Have you missed any doses in the last 30 days? No   Have you had any changes to your medication(s) since your last refill? No   How much of each medication do you have remaining at home? (eg. number of tablets, injections, etc.) 0   If receiving an injectable medication, next injection date is 01/13/2024   Have you experienced any side effects in the last 30 days? No   Please enter the full address (street address, city, state, zip code) where you would like your medication(s) to be delivered to. [address] [place] [address] [name] [address]   Please specify on which day you would like your medication(s) to arrive. Note: if you need your medication(s) within 3 days, please call the pharmacy to schedule your order at [phone]  01/06/2024   Has your insurance changed since your last refill? No   Would you like a pharmacist to call you to discuss your medication(s)? No   Do you require a signature for your package? (Note: if we are billing Medicare Part B or your order contains a controlled substance, we will require a signature) No   I have been provided my out of pocket cost for my medication and approve the pharmacy to charge the amount to my credit card on file. Yes         Completed refill call assessment today to schedule patient's medication shipment from the [facility] [phone]).  All relevant notes have been reviewed.       Confirmed patient received a Conservation Officer, Historic Buildings and a Surveyor, Mining with first shipment. The patient will receive a drug information handout for each medication shipped and additional FDA Medication Guides as required.         REFERRAL TO PHARMACIST     Referral to the pharmacist: Not needed      [name]     Shipping address confirmed in Epic.     Delivery Scheduled: Yes, Expected medication delivery date: 01/06/24.     Medication will be delivered via Next Day Courier to the prescription address in Epic WAM.    [name]   [facility] Specialty Technician

## 2024-01-05 MED FILL — XOLAIR 150 MG/ML SUBCUTANEOUS SYRINGE: SUBCUTANEOUS | 28 days supply | Qty: 4 | Fill #3

## 2024-01-07 MED ORDER — OXYCODONE-ACETAMINOPHEN 10 MG-325 MG TABLET
ORAL_TABLET | ORAL | 0 refills | 30.00000 days | Status: CP | PRN
Start: 2024-01-07 — End: 2023-10-25

## 2024-01-10 ENCOUNTER — Ambulatory Visit: Admit: 2024-01-10 | Discharge: 2024-01-11 | Payer: Medicare (Managed Care)

## 2024-01-10 DIAGNOSIS — Z79890 Hormone replacement therapy (postmenopausal): Principal | ICD-10-CM

## 2024-01-10 DIAGNOSIS — N951 Menopausal and female climacteric states: Principal | ICD-10-CM

## 2024-01-10 DIAGNOSIS — I10 Essential (primary) hypertension: Principal | ICD-10-CM

## 2024-01-10 DIAGNOSIS — J0100 Acute maxillary sinusitis, unspecified: Principal | ICD-10-CM

## 2024-01-10 DIAGNOSIS — E559 Vitamin D deficiency, unspecified: Principal | ICD-10-CM

## 2024-01-10 DIAGNOSIS — G43E01 Chronic migraine with aura, not intractable, with status migrainosus: Principal | ICD-10-CM

## 2024-01-10 DIAGNOSIS — R252 Cramp and spasm: Principal | ICD-10-CM

## 2024-01-10 MED ORDER — UBRELVY 50 MG TABLET
ORAL_TABLET | ORAL | 0 refills | 0.00000 days | Status: CP
Start: 2024-01-10 — End: ?

## 2024-01-10 MED ORDER — PROGESTERONE MICRONIZED 100 MG CAPSULE
ORAL_CAPSULE | Freq: Every day | ORAL | 0 refills | 30.00000 days | Status: CP
Start: 2024-01-10 — End: 2025-01-09

## 2024-01-10 MED ORDER — AMOXICILLIN 500 MG CAPSULE
ORAL_CAPSULE | Freq: Three times a day (TID) | ORAL | 0 refills | 10.00000 days | Status: CP
Start: 2024-01-10 — End: 2024-01-20

## 2024-01-10 MED ORDER — AMLODIPINE 5 MG TABLET
ORAL_TABLET | Freq: Every day | ORAL | 1 refills | 30.00000 days | Status: CP
Start: 2024-01-10 — End: 2025-01-09

## 2024-01-10 MED ORDER — ESTRADIOL 1 MG TABLET
ORAL_TABLET | Freq: Every day | ORAL | 0 refills | 0.00000 days
Start: 2024-01-10 — End: 2025-01-09

## 2024-01-10 NOTE — Progress Notes (Signed)
 Wants refill on headache medication , and hormone meds.both feet and legs cramping as if she's having a trolley horse. States that her BP has been running high lately

## 2024-01-10 NOTE — Progress Notes (Signed)
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 (0.3 mg total) into the muscle once as needed for anaphylaxis (Difficulty breathing, throat closing, etc) for up to 1 dose. 1 each 12    erythromycin (ROMYCIN) 5 mg/gram (0.5 %) ophthalmic ointment 1 application. (1 Application total).      famotidine  (PEPCID ) 20 MG tablet Take 1 tablet (20 mg total) by mouth two (2) times a day as needed for heartburn. 60 tablet 1    gabapentin  (NEURONTIN ) 800 MG tablet Take 1 tablet (800 mg total) by mouth nightly. 90 tablet 0    hydroxychloroquine  (PLAQUENIL ) 200 mg tablet Take 1 tablet (200 mg total) by mouth two (2) times a day. 60 tablet 12    ipratropium (ATROVENT ) 21 mcg (0.03 %) nasal spray 2 sprays into each nostril four (4) times a day. 30 mL 1    lidocaine  (LIDODERM ) 5 % patch PLACE 1 PATCH ON THE SKIN DAILY. APPLY UP TO 1 PATCH AT A TIME. USE FOR UP TO 12 HOURS/DAY 30 patch 2    lidocaine  (XYLOCAINE ) 5 % ointment Apply 2-4 grams(1-2 inch ribbon) to affected areas up to three times a day as needed for pain. 200 g 2    milnacipran  (SAVELLA ) 25 mg Tab Take 1 tablet (25 mg total) by mouth in the morning. 30 tablet 2  naloxone  (NARCAN ) 4 mg nasal spray 1 spray into alternating nostrils once as needed (opioid overdose.). One spray in either nostril once for known/suspected opioid overdose. May repeat every 2-3 minutes in alternating nostril til EMS arrives 2 each 2    omalizumab  (XOLAIR ) 150 mg/mL syringe Inject 2 syringes (300 mg total) under the skin every fourteen (14) days. 4 mL 11    oxyCODONE -acetaminophen  (PERCOCET) 10-325 mg per tablet Take 1 tablet by mouth every four (4) hours as needed for pain. Max 6 tabs/day. OK to fill 11/08/23 180 tablet 0    oxyCODONE -acetaminophen  (PERCOCET) 10-325 mg per tablet Take 1 tablet by mouth every four (4) hours as needed for pain. Max 6 tabs/day. OK to fill 12/08/23 180 tablet 0    oxyCODONE -acetaminophen  (PERCOCET) 10-325 mg per tablet Take 1 tablet by mouth every four (4) hours as needed for pain. Max 6 tabs/day. OK to fill 01/07/24 180 tablet 0    pantoprazole  (PROTONIX ) 40 MG tablet TAKE 1 TABLET(40 MG) BY MOUTH DAILY 30 MINUTES BEFORE BREAKFAST AS NEEDED 90 tablet 3    potassium chloride  20 mEq TbER TAKE 1 TABLET BY MOUTH DAILY 90 tablet 2    rosuvastatin  (CRESTOR ) 10 MG tablet Take 1 tablet (10 mg total) by mouth nightly. 90 tablet 3    XHANCE  93 mcg/actuation AerB USE 1 SPRAY INTO EACH NOSTRIL TWICE DAILY AS NEEDED 16 mL 11    amlodipine  (NORVASC ) 5 MG tablet Take 1 tablet (5 mg total) by mouth daily. 30 tablet 1    amoxicillin  (AMOXIL ) 500 MG capsule Take 1 capsule (500 mg total) by mouth Three (3) times a day for 10 days. 30 capsule 0    cholecalciferol , vitamin D3-1,250 mcg, 50,000 unit,, 1,250 mcg (50,000 unit) capsule 1 capsule PO Weekly x 12 weeks.  After that, cut down to over-the-counter vitamin D 2000 units daily. 12 capsule 0    estradiol  (ESTRACE ) 1 MG tablet Take 1 tablet (1 mg total) by mouth daily. 30 tablet 0    progesterone  (PROMETRIUM ) 100 MG capsule Take 1 capsule (100 mg total) by mouth daily. 30 capsule 0    ubrogepant  (UBRELVY ) 50 mg tablet TAKE 1 TABLET BY MOUTH FOR HEADACHE. MAY REPEAT IN 2 HOURS IF NEEDED. LIMIT 2 TABLETS PER DAY AND NO MORE THAN 4 TABLETS PER WEEK 16 tablet 0     No current facility-administered medications for this visit.

## 2024-01-11 ENCOUNTER — Ambulatory Visit: Admit: 2024-01-11 | Discharge: 2024-01-12 | Payer: Medicare (Managed Care)

## 2024-01-11 DIAGNOSIS — I10 Essential (primary) hypertension: Principal | ICD-10-CM

## 2024-01-11 DIAGNOSIS — E559 Vitamin D deficiency, unspecified: Principal | ICD-10-CM

## 2024-01-11 DIAGNOSIS — R252 Cramp and spasm: Principal | ICD-10-CM

## 2024-01-11 LAB — CBC W/ AUTO DIFF
BASOPHILS ABSOLUTE COUNT: 0 10*9/L (ref 0.0–0.1)
BASOPHILS RELATIVE PERCENT: 0.5 %
EOSINOPHILS ABSOLUTE COUNT: 0.1 10*9/L (ref 0.0–0.5)
EOSINOPHILS RELATIVE PERCENT: 2.2 %
HEMATOCRIT: 40 % (ref 34.0–44.0)
HEMOGLOBIN: 13.2 g/dL (ref 11.3–14.9)
LYMPHOCYTES ABSOLUTE COUNT: 2 10*9/L (ref 1.1–3.6)
LYMPHOCYTES RELATIVE PERCENT: 36.1 %
MEAN CORPUSCULAR HEMOGLOBIN CONC: 32.9 g/dL (ref 32.0–36.0)
MEAN CORPUSCULAR HEMOGLOBIN: 30.5 pg (ref 25.9–32.4)
MEAN CORPUSCULAR VOLUME: 92.8 fL (ref 77.6–95.7)
MEAN PLATELET VOLUME: 10.4 fL (ref 6.8–10.7)
MONOCYTES ABSOLUTE COUNT: 0.4 10*9/L (ref 0.3–0.8)
MONOCYTES RELATIVE PERCENT: 7.4 %
NEUTROPHILS ABSOLUTE COUNT: 2.9 10*9/L (ref 1.8–7.8)
NEUTROPHILS RELATIVE PERCENT: 53.8 %
PLATELET COUNT: 240 10*9/L (ref 150–450)
RED BLOOD CELL COUNT: 4.32 10*12/L (ref 3.95–5.13)
RED CELL DISTRIBUTION WIDTH: 12.9 % (ref 12.2–15.2)
WBC ADJUSTED: 5.5 10*9/L (ref 3.6–11.2)

## 2024-01-11 LAB — COMPREHENSIVE METABOLIC PANEL
ALBUMIN: 4.2 g/dL (ref 3.4–5.0)
ALKALINE PHOSPHATASE: 54 U/L (ref 46–116)
ALT (SGPT): 14 U/L (ref 10–49)
ANION GAP: 9 mmol/L (ref 3–11)
AST (SGOT): 20 U/L (ref <34)
BILIRUBIN TOTAL: 0.6 mg/dL (ref 0.3–1.2)
BLOOD UREA NITROGEN: 8 mg/dL — ABNORMAL LOW (ref 9–23)
BUN / CREAT RATIO: 12
CALCIUM: 9.8 mg/dL (ref 8.7–10.4)
CHLORIDE: 101 mmol/L (ref 98–107)
CO2: 30 mmol/L (ref 20.0–31.0)
CREATININE: 0.69 mg/dL (ref 0.55–1.02)
EGFR CKD-EPI (2021) FEMALE: >90 mL/min/1.73m2 (ref >=60)
GLUCOSE RANDOM: 79 mg/dL (ref 70–179)
POTASSIUM: 3.4 mmol/L — ABNORMAL LOW (ref 3.5–5.1)
PROTEIN TOTAL: 8.1 g/dL (ref 5.7–8.2)
SODIUM: 140 mmol/L (ref 136–145)

## 2024-01-11 LAB — MAGNESIUM: MAGNESIUM: 2 mg/dL (ref 1.6–2.6)

## 2024-01-11 MED ORDER — ESTRADIOL 1 MG TABLET
ORAL_TABLET | Freq: Every day | ORAL | 90.00000 days
Start: 2024-01-11 — End: ?

## 2024-01-12 LAB — VITAMIN D 25 HYDROXY: VITAMIN D, TOTAL (25OH): 20.6 ng/mL (ref 20.0–80.0)

## 2024-01-12 MED ORDER — CHOLECALCIFEROL (VITAMIN D3) 1,250 MCG (50,000 UNIT) CAPSULE
ORAL_CAPSULE | ORAL | 0 refills | 0.00000 days | Status: CP
Start: 2024-01-12 — End: ?

## 2024-01-16 NOTE — Telephone Encounter (Addendum)
 ubrogepant  (UBRELVY ) 50 mg tablet     Reply deadline: January 17, 2024  4:05 PM  Case ID: [id number]  Payer: [employer] Part D  Phone: [phone]  Fax: [phone]    Associated Diagnoses: Chronic migraine with aura, not intractable, with status migrainosus [G43.E01]     Patient Name: Natalie [name]   Patient DOB: 02-25-1970  Patient ID: [id number]   Status of Request: Cancelled  Medication Name: Ubrelvy  Tab 50mg    GPI/NDC: [id number]  Decision Notes:  This medication or product was previously approved on A-25AEPA1 from 01/26/2023 to 01/25/2024.  and on A-26AEPA1 from 01/26/2024 to 01/24/2025. You will be able to fill a prescription for this medication at your pharmacy. If your pharmacy has questions regarding the processing of your prescription, please have them call the [facility] pharmacy help desk at [phone].

## 2024-01-20 NOTE — Telephone Encounter (Signed)
 Upcoming Appt:  Future Appointments   Date Time Provider Department Center   01/20/2024  4:10 PM Nydia Handler, DO [facility]   01/24/2024  1:00 PM Lobonc, Prentice Cadet, MD [facility]   01/27/2024  8:00 AM Debera Almarie Bradley, PA [facility]   01/31/2024  9:30 AM Goetzinger, Greig Klinefelter, PhD [facility]   02/03/2024  8:00 AM Debera Almarie Bradley, PA [facility]   02/10/2024  8:00 AM Debera Almarie Bradley, PA [facility]       Disposition:  See Physician Within 24 Hours    Triage documentation reviewed and Disposition present?  yes    Encounter Reason for Disposition:    [1] MODERATE headache (e.g., interferes with normal activities) AND [2] present > 24 hours AND [3] unexplained  (Exceptions: Pain medicines not tried, typical migraine, or headache part of viral illness.)      Is this a pediatric patient?   No     Any recent, relevant visit?   Yes     Date/location of recent visit?  01/20/2024 Office Visit, congestion, headache    Any related medications?  Yes     Name of medication and dose?  Amoxicillin  500 MG TID- Course completed    Any relevant medical history?   Yes     What history?  Migraines    Any interventions?   Yes     What has been done? (include related medication given, dose, and date/time)  Ubrelvy  50 mg 01/19/2024 10 pm (12 hours ago)      [facility] Eligibility    Patient [facility] eligible (according to most recent zip code and insurance information)? Yes  Address on file: [address]  [place] [address]  Insurance on file: [place] [employer]  Member ID: [id number]  Subscriber Name: Janish,Cedra ARNESS    Verify home address ([address] Box addresses will not be accepted) & insurance information with the patient before placing Dispatch referral. If the information on file is not up to date, do not place Dispatch referral.    Patient is [facility] eligible and appropriate for referral: No     Encounter Initial Assessment:  1. LOCATION: Where does it hurt?       Patient reports pain around left eye, head feels full. Patient seen in office 01/10/2024 (10 days ago) for congestion, headache. Antibiotic complete, patient improved in terms of congestion, though headache still not fully resolved with migraine medication.  2. ONSET: When did the headache start? (e.g., minutes, hours, days)       Patient with chronic headaches for last 6 months, most recent headache lasting approximately 10 days since last being seen. Patient reports eye burning, though eye itself normal in appearance, denies presence of eyelid swelling, redness.Patient does note that the bag under her left eye looks a little puffy.   3. PATTERN: Does the pain come and go, or has it been constant since it started?      Constant  4. SEVERITY: How bad is the pain? and What does it keep you from doing?  (e.g., Scale 1-10; mild, moderate, or severe)      Patient reports constant headache, temporarily alleviated by migraine pills. Patient reports headache severe overnight, but less so today.  Patient rates pain 6/10 at time of call.  5. RECURRENT SYMPTOM: Have you ever had headaches before? If Yes, ask: When was the last time? and What happened that time?  Patient reports chronic headaches, with new eye pain with current episode.   6. CAUSE: What do you think is causing the headache?      Chronic Headache, Migraine related  7. MIGRAINE: Have you been diagnosed with migraine headaches? If Yes, ask: Is this headache similar?       Yes, patient diagnosed with migraines, eye pain is different.  8. HEAD INJURY: Has there been any recent injury to your head?       Denies  9. OTHER SYMPTOMS: Do you have any other symptoms? (e.g., fever, stiff neck, eye pain, sore throat, cold symptoms)      Patient reports chronic neck pain that feels a little dull and achy, she is able to touch chin to chest.   Denies sore throat, cold symptoms, fever.  10. PREGNANCY: Is there any chance you are pregnant? When was your last menstrual period?        N/A      Encounter Protocols Used:  [facility]

## 2024-01-21 ENCOUNTER — Emergency Department
Admit: 2024-01-21 | Discharge: 2024-01-21 | Disposition: A | Discharge status: Home / Self Care | Payer: Medicare (Managed Care)

## 2024-01-21 DIAGNOSIS — R519 Acute nonintractable headache, unspecified headache type: Principal | ICD-10-CM

## 2024-01-21 LAB — CBC W/ AUTO DIFF
BASOPHILS ABSOLUTE COUNT: 0.1 10*9/L (ref 0.0–0.1)
BASOPHILS RELATIVE PERCENT: 1.3 %
EOSINOPHILS ABSOLUTE COUNT: 0.1 10*9/L (ref 0.0–0.5)
EOSINOPHILS RELATIVE PERCENT: 1.3 %
HEMATOCRIT: 42.5 % (ref 34.0–44.0)
HEMOGLOBIN: 13.9 g/dL (ref 11.3–14.9)
LYMPHOCYTES ABSOLUTE COUNT: 2.1 10*9/L (ref 1.1–3.6)
LYMPHOCYTES RELATIVE PERCENT: 28.4 %
MEAN CORPUSCULAR HEMOGLOBIN CONC: 32.7 g/dL (ref 32.0–36.0)
MEAN CORPUSCULAR HEMOGLOBIN: 30.2 pg (ref 25.9–32.4)
MEAN CORPUSCULAR VOLUME: 92.3 fL (ref 77.6–95.7)
MEAN PLATELET VOLUME: 10.5 fL (ref 6.8–10.7)
MONOCYTES ABSOLUTE COUNT: 0.6 10*9/L (ref 0.3–0.8)
MONOCYTES RELATIVE PERCENT: 8 %
NEUTROPHILS ABSOLUTE COUNT: 4.6 10*9/L (ref 1.8–7.8)
NEUTROPHILS RELATIVE PERCENT: 61 %
PLATELET COUNT: 210 10*9/L (ref 150–450)
RED BLOOD CELL COUNT: 4.6 10*12/L (ref 3.95–5.13)
RED CELL DISTRIBUTION WIDTH: 13.3 % (ref 12.2–15.2)
WBC ADJUSTED: 7.5 10*9/L (ref 3.6–11.2)

## 2024-01-21 LAB — MAGNESIUM: MAGNESIUM: 2.1 mg/dL (ref 1.6–2.6)

## 2024-01-21 LAB — BASIC METABOLIC PANEL
ANION GAP: 8 mmol/L (ref 3–11)
BLOOD UREA NITROGEN: <5 mg/dL — ABNORMAL LOW (ref 9–23)
CALCIUM: 9.7 mg/dL (ref 8.7–10.4)
CHLORIDE: 104 mmol/L (ref 98–107)
CO2: 27 mmol/L (ref 20.0–31.0)
CREATININE: 0.66 mg/dL (ref 0.55–1.02)
EGFR CKD-EPI (2021) FEMALE: >90 mL/min/1.73m2 (ref >=60)
GLUCOSE RANDOM: 79 mg/dL (ref 70–179)
POTASSIUM: 4.3 mmol/L (ref 3.5–5.1)
SODIUM: 139 mmol/L (ref 136–145)

## 2024-01-21 MED ORDER — DICLOFENAC POTASSIUM 50 MG TABLET
ORAL_TABLET | Freq: Three times a day (TID) | ORAL | 0 refills | 9.00000 days | Status: CP | PRN
Start: 2024-01-21 — End: 2024-01-29

## 2024-01-21 MED ADMIN — diphenhydrAMINE (BENADRYL) injection 12.5 mg: 12.5 mg | INTRAVENOUS | @ 21:00:00 | Stop: 2024-01-21

## 2024-01-21 MED ADMIN — ketorolac (TORADOL) injection 30 mg: 30 mg | INTRAVENOUS | @ 21:00:00 | Stop: 2024-01-21

## 2024-01-21 MED ADMIN — prochlorperazine (COMPAZINE) injection 5 mg: 5 mg | INTRAVENOUS | @ 21:00:00 | Stop: 2024-01-21
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
## 2024-01-21 NOTE — ED Notes (Signed)
 Patient's vitals are stable with no acute distress noted. All discharge instructions, medications, and follow up instructions were reviewed with patient and understanding was verbalized. Patient left with all belongings. Pt. is ambulatory and A&O X4.

## 2024-01-21 NOTE — ED Notes (Signed)
 Pt asking to have her IV taken out. Made her aware if other tests are needed we will have to place another one. She said ok that's fine

## 2024-01-22 DIAGNOSIS — E876 Hypokalemia: Principal | ICD-10-CM

## 2024-01-22 MED ORDER — POTASSIUM CHLORIDE ER 20 MEQ TABLET,EXTENDED RELEASE
ORAL_TABLET | Freq: Every day | ORAL | 2 refills | 0.00000 days
Start: 2024-01-22 — End: ?

## 2024-01-23 MED ORDER — POTASSIUM CHLORIDE ER 20 MEQ TABLET,EXTENDED RELEASE
ORAL_TABLET | Freq: Every day | ORAL | 3 refills | 0.00000 days | Status: CP
Start: 2024-01-23 — End: ?

## 2024-01-23 NOTE — Telephone Encounter (Signed)
 Patient is requesting the following refill  Requested Prescriptions     Pending Prescriptions Disp Refills    potassium chloride  20 mEq Glenna Cocking Med Name: POTASSIUM CHLORIDE  ER TABLETS] 90 tablet 2     Sig: TAKE 1 TABLET BY MOUTH DAILY       Recent Visits  Date Type Provider Dept   01/10/24 Office Visit Neelagiri, Montel, MD [facility]   12/02/23 Telemedicine Neelagiri, Montel, MD [facility]   10/03/23 Office Visit Neelagiri, Montel, MD [facility]   08/29/23 Office Visit Neelagiri, Montel, MD [facility]   07/05/23 Office Visit Neelagiri, Montel, MD [facility]   06/13/23 Telemedicine Neelagiri, Montel, MD [facility]   05/18/23 Office Visit Neelagiri, Montel, MD [place] [facility]   02/23/23 Office Visit Neelagiri, Montel, MD [place] [facility]   Showing recent visits within past 365 days and meeting all other requirements  Future Appointments  No visits were found meeting these conditions.  Showing future appointments within next 365 days and meeting all other requirements       Labs: Potassium:   Potassium, Serum (mEq/L)   Date Value   12/19/2012 4.1     Potassium (mmol/L)   Date Value   01/21/2024 4.3   01/02/2015 3.9

## 2024-01-24 ENCOUNTER — Ambulatory Visit: Admit: 2024-01-24 | Payer: Medicare (Managed Care) | Attending: Anesthesiology | Primary: Anesthesiology

## 2024-01-24 DIAGNOSIS — M7918 Myalgia, other site: Principal | ICD-10-CM

## 2024-01-24 DIAGNOSIS — M5417 Radiculopathy, lumbosacral region: Principal | ICD-10-CM

## 2024-01-24 MED ORDER — GABAPENTIN 800 MG TABLET
ORAL_TABLET | Freq: Two times a day (BID) | ORAL | 0.00000 days
Start: 2024-01-24 — End: ?

## 2024-01-24 NOTE — Progress Notes (Unsigned)
 Name:  Natalie [name]  DOB: November 14, 1970  Date: 01/24/2024    Subjective:   Murle Hellstrom is a 53 y.o. female here for ***    No chief complaint on file.      History of Present Illness         PHQ-2 Score:      PHQ-9 Score:      Edinburgh Score:      {select_status_or_delete_smartlist:64641}     Vital Signs:     There were no vitals taken for this visit.    Wt Readings from Last 3 Encounters:   01/21/24 74.8 kg (165 lb)   01/10/24 75.1 kg (165 lb 9.6 oz)   11/28/23 75.3 kg (166 lb)     BP Readings from Last 3 Encounters:   01/21/24 136/71   01/10/24 142/84   10/25/23 158/93       Review of Systems:      Review of systems negative unless otherwise stated in HPI.    Objective:     Physical Exam      Physical Exam  Constitutional:       Appearance: Normal appearance.   Cardiovascular:      Rate and Rhythm: Normal rate and regular rhythm.   Pulmonary:      Effort: Pulmonary effort is normal.      Breath sounds: Normal breath sounds.   Skin:     General: Skin is warm and dry.   Neurological:      Mental Status: She is alert.   Psychiatric:         Mood and Affect: Mood normal.         SDOH: I provided an intervention for the {SDOH INFJPW:54326} SDOH domain. The intervention was {SDOH INTERVENTION:45674}       Assessment/Plan:     Assessment & Plan      Assessment & Plan        The problems listed above were reviewed and discussed in details today with the patient. All medications and labs were discussed. Updates made in problem list charting. Any changes needed after lab results/imaging returns will be made in a telephone or result note.     We discussed return and emergency precautions and when to present to ED or seek sooner follow up visit for above conditions. We discussed appropriate use and possible adverse effects and monitoring for new and high risk medications. Suggested patient read the package insert to all medications for possible warnings and side effects.        Eirene Rather K Atonya Templer, FNP

## 2024-01-25 ENCOUNTER — Ambulatory Visit: Admit: 2024-01-25 | Discharge: 2024-01-26 | Payer: Medicare (Managed Care)

## 2024-01-25 DIAGNOSIS — I10 Essential (primary) hypertension: Principal | ICD-10-CM

## 2024-01-25 DIAGNOSIS — E559 Vitamin D deficiency, unspecified: Principal | ICD-10-CM

## 2024-01-25 DIAGNOSIS — H1013 Acute atopic conjunctivitis, bilateral: Principal | ICD-10-CM

## 2024-01-25 MED ORDER — AMLODIPINE 5 MG TABLET
ORAL_TABLET | Freq: Every day | ORAL | 1 refills | 90.00000 days
Start: 2024-01-25 — End: 2025-01-24

## 2024-01-25 MED ORDER — AZELASTINE 0.05 % EYE DROPS
Freq: Every day | OPHTHALMIC | 0 refills | 60.00000 days | Status: CP | PRN
Start: 2024-01-25 — End: ?

## 2024-01-25 MED ORDER — GABAPENTIN 800 MG TABLET
ORAL_TABLET | Freq: Two times a day (BID) | ORAL | 0 refills | 90.00000 days | Status: CP
Start: 2024-01-25 — End: ?

## 2024-01-25 NOTE — Progress Notes (Signed)
 Left knee severe medial compartment osteoarthritis, Right knee osteoarthritis; Gelsyn injection #1/3 (Dr. Venice) (left > right, last injection series 06/2023, 12/2022, 03/2022, 07/2021 with good relief)     We discussed the Gelsyn injection series for the bilateral knee and the patient desired to proceed.     Procedure: After sterile prep, 2ml Gelsyn was injected into the left knee. The patient tolerated the procedure well.     Procedure: After sterile prep, 2ml Gelsyn was injected into the right knee. The patient tolerated the procedure well.     Follow-up in 1 week for the second bilateral knee Gelsyn injection.

## 2024-01-27 ENCOUNTER — Ambulatory Visit: Admit: 2024-01-27 | Discharge: 2024-01-28 | Payer: Medicare (Managed Care)

## 2024-01-27 DIAGNOSIS — M170 Bilateral primary osteoarthritis of knee: Principal | ICD-10-CM

## 2024-01-27 MED ADMIN — sodium hyaluronate (viscosup) (GELSYN-3) 16.8 mg/2 mL injection 16.8 mg: 16.8 mg | INTRA_ARTICULAR | @ 13:00:00 | Stop: 2024-01-27

## 2024-01-31 ENCOUNTER — Encounter
Admit: 2024-01-31 | Discharge: 2024-02-01 | Payer: Medicare (Managed Care) | Attending: Psychologist | Primary: Psychologist

## 2024-01-31 DIAGNOSIS — F419 Anxiety disorder, unspecified: Principal | ICD-10-CM

## 2024-01-31 DIAGNOSIS — F1190 Opioid use, unspecified, uncomplicated: Principal | ICD-10-CM

## 2024-01-31 NOTE — Progress Notes (Signed)
 [name]   Confidential Psychological Therapy Session    Patient Name: Natalie [name]  Medical Record Number: [id number]  Date of Service: January 31, 2024  Attending Psychologist: Greig Holland, PhD  CPT Procedure Code: 09162 for 60 minutes of face to face counseling    This visit was attempted via face to face with interactive technology using a HIPPA compliant audio/visual platform. Patient unable to connect today (often she can, but had technical difficulties today) so we completed the visit via phone (audio only). We reviewed confidentiality today. The patient was present in [place] , a state in which this provider is licensed and able to provide care (location and contact information confirmed), attended this visit alone, and consented to this virtual pain psychology visit.    REFERRING PHYSICIAN: Prentice Cost, MD    CHIEF COMPLAINT AND REASON FOR VISIT:  COM follow up evaluation/pain coping skills; CBT/ACT for pain; grief    SUBJECTIVE / HISTORY OF PRESENT ILLNESS: [name] is a very pleasant 54 y.o.  female with chronic lower back pain, chronic headaches, and myofascial pain. Her pain started in 1999 in her lower back.  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  She had a spinal fusion in 2001 at the L5-S1 level.  She has had bilateral shoulder pain, neck pain, lower back pain, headaches and b/l knees.     Pt initially established with Dr. Cost in 08/2016 and then was was first evaluated by me in 07/2017. Last follow up with me was 12/26/2023.  Patient processes thoughts and feelings related to pain, recent other psychosocial factors - had tire blow out last night - brother helped her get a new tire and change but was helping and now knee pain higher. Had round 3 of diagnostic nerve blocks and will soon proceed with RFA to address b/l knee pain. Pt anticipate sthat QOL and functioing will improve thereafter, and now just continues to used pacing and ARC to help with pain. Recently diagnosed and treated for hypertension after having headaches - pain improved and now feeling a little better overall too. Patient contemplates [facility] based on boyfriend's recommendation but feel strongly that she does better getting out of the house regularly - processed these connections today. Mood and anxiety are stable.  Last UTS was in 07/2023 and was appropriate. Reports good compliance. Let pain still present but working on making functional and practical accommodations to stay active. Interested in repeat TPI = has appointment with Dr. Cost on 1/12 - also interested in repeat caudal (says it has been about a year since last).     OBJECTIVE / MENTAL STATUS:    Appearance:   Appears stated age and Clean/Neat   Motor:  No abnormal movements   Speech/Language:   Normal rate, volume, tone, fluency   Mood:  Irritable   Affect:  Blunted   Thought process:  Logical, linear, clear, coherent, goal directed   Thought content:    Denies SI, HI, self harm, delusions, obsessions, paranoid ideation, or ideas of reference   Perceptual disturbances:    Denies auditory and visual hallucinations, behavior not concerning for response to internal stimuli   Orientation:  Oriented to person, place, time, and general circumstances   Attention:  Able to fully attend without fluctuations in consciousness   Concentration:  Able to fully concentrate and attend   Memory:  Immediate, short-term, long-term, and recall grossly intact  Fund of knowledge:   Consistent with level of education and development   Insight:    Fair   Judgment:   Intact   Impulse Control:  Intact       DIAGNOSTIC IMPRESSION:   Anxiety NOS  Chronic continuou use of opiates    /ASSESSMENT:   [name] is a very pleasant 54 y.o.  female from [place], [place] with chronic lower back pain, chronic . headaches, and myofascial pain. Her pain started in 1999 in her lower back.  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  She had a spinal fusion in 2001 at the L5-S1 level.  She has had bilateral shoulder pain, neck pain, lower back pain, and b/l knees.  She also described a radiculopathy b/l that goes from her back to her bilateral buttocks and down to her toes.  The patient is currently considered to be high risk due to prior nonadherence, but appropriate with a behavioral adherence plan in place.  She is shifting her career, and connects her committed action plan to values, goals and needs.  Mood and anxiety are stable, reasonably well-managed at this time.    PLAN:   (1) COM - high risk but remains appropriate with a behavioral adherence plan also in place.    -Patient MUST meet with pain psychology/me once a month.   -No additional overuse of opioids will be tolerated.    -Patient should always bring her medication to clinic for pill counts.  -Patient was informed she has had numerous infractions with respect to her pain contract over the years, and that no further infractions will be tolerated.    If any additional pain contract or behavioral adherence contract infractions occur, I recommend stopping opioids.    Pt doing well with adherence, per our last few visits.     (2) Pain coping skills- addressed compliance, as well as continued skills practice    (3) follow-in 1 month

## 2024-02-02 MED ORDER — SAVELLA 25 MG TABLET
ORAL_TABLET | 2 refills | 0.00000 days
Start: 2024-02-02 — End: ?

## 2024-02-02 MED ORDER — MILNACIPRAN 25 MG TABLET
ORAL_TABLET | Freq: Every day | ORAL | 2 refills | 30.00000 days
Start: 2024-02-02 — End: ?

## 2024-02-02 NOTE — Telephone Encounter (Signed)
 Rec'd refill request through interface from patient's pharmacy for Savella  25mg .  Last prescribed 10/25/23, last filled 12/27/23  Last OV: 10/25/23  Next OV: 02/06/24  Please consider refill if appropriate

## 2024-02-02 NOTE — Telephone Encounter (Signed)
 Refill request received via interface for milnacipran  (SAVELLA ) 25 mg Tab. POV: 10/25/23; Next scheduled office visit: 02/06/24. Please consider refill if appropriate.

## 2024-02-02 NOTE — Progress Notes (Signed)
 Left knee severe medial compartment osteoarthritis, Right knee osteoarthritis; Gelsyn injection #2/3 (Dr. Venice) (left > right, last injection series 06/2023, 12/2022, 03/2022, 07/2021 with good relief)     We discussed the Gelsyn injection series for the bilateral knee and the patient desired to proceed.     Procedure: After sterile prep, 2ml Gelsyn was injected into the left knee. The patient tolerated the procedure well.     Procedure: After sterile prep, 2ml Gelsyn was injected into the right knee. The patient tolerated the procedure well.     Follow-up in 1 week for the third bilateral knee Gelsyn injection.

## 2024-02-03 ENCOUNTER — Ambulatory Visit: Admit: 2024-02-03 | Discharge: 2024-02-04 | Payer: Medicare (Managed Care)

## 2024-02-03 DIAGNOSIS — M170 Bilateral primary osteoarthritis of knee: Principal | ICD-10-CM

## 2024-02-03 MED ADMIN — sodium hyaluronate (viscosup) (GELSYN-3) 16.8 mg/2 mL injection 16.8 mg: 16.8 mg | INTRA_ARTICULAR | @ 13:00:00 | Stop: 2024-02-03

## 2024-02-04 MED ORDER — MILNACIPRAN 25 MG TABLET
ORAL_TABLET | Freq: Every day | ORAL | 0 refills | 30.00000 days | Status: CP
Start: 2024-02-04 — End: ?

## 2024-02-04 MED ORDER — SAVELLA 25 MG TABLET
ORAL_TABLET | 2 refills | 0.00000 days
Start: 2024-02-04 — End: ?

## 2024-02-04 NOTE — Telephone Encounter (Signed)
Refilled [employer]

## 2024-02-06 ENCOUNTER — Ambulatory Visit: Admit: 2024-02-06 | Payer: Medicare (Managed Care)

## 2024-02-06 MED ORDER — OXYCODONE-ACETAMINOPHEN 10 MG-325 MG TABLET
ORAL_TABLET | ORAL | 0 refills | 30.00000 days | PRN
Start: 2024-02-06 — End: 2024-03-07

## 2024-02-06 NOTE — Progress Notes (Signed)
 [facility] Refill Coordination Note    Natalie [name], [place]: 1971/01/07  Phone: [phone] (home)       All above HIPAA information was verified with patient.         02/02/2024    10:29 AM   Specialty Rx Medication Refill Questionnaire   Which Medications would you like refilled and shipped? Xolair    Please list all current allergies: Record   Have you missed any doses in the last 30 days? No   Have you had any changes to your medication(s) since your last refill? No   How much of each medication do you have remaining at home? (eg. number of tablets, injections, etc.) 0   If receiving an injectable medication, next injection date is 06/13/1970   Have you experienced any side effects in the last 30 days? No   Please enter the full address (street address, city, state, zip code) where you would like your medication(s) to be delivered to. [address] [place] [address]   Please specify on which day you would like your medication(s) to arrive. Note: if you need your medication(s) within 3 days, please call the pharmacy to schedule your order at [phone]  02/06/2024   Has your insurance changed since your last refill? No   Would you like a pharmacist to call you to discuss your medication(s)? No   Do you require a signature for your package? (Note: if we are billing Medicare Part B or your order contains a controlled substance, we will require a signature) No   I have been provided my out of pocket cost for my medication and approve the pharmacy to charge the amount to my credit card on file. Yes         Completed refill call assessment today to schedule patient's medication shipment from the [facility] [phone]).  All relevant notes have been reviewed.       Confirmed patient received a Conservation Officer, Historic Buildings and a Surveyor, Mining with first shipment. The patient will receive a drug information handout for each medication shipped and additional FDA Medication Guides as required.         REFERRAL TO PHARMACIST     Referral to the pharmacist: Not needed      [facility]     Shipping address confirmed in Epic.     Delivery Scheduled: Yes, Expected medication delivery date: 01/14.     Medication will be delivered via Next Day Courier to the prescription address in Epic WAM.    Natalie [name]   [facility] Specialty Technician

## 2024-02-06 NOTE — Progress Notes (Signed)
 02/06/2024 - Patient originally requested delivery for 01/12. Delivery is not possible on this date due to OOS. I have reached out to the patient and confirmed that delivery on 01/14 is ok.

## 2024-02-07 ENCOUNTER — Ambulatory Visit: Admit: 2024-02-07 | Discharge: 2024-02-08 | Payer: Medicare (Managed Care)

## 2024-02-07 DIAGNOSIS — Z0289 Encounter for other administrative examinations: Principal | ICD-10-CM

## 2024-02-07 DIAGNOSIS — M7918 Myalgia, other site: Principal | ICD-10-CM

## 2024-02-07 DIAGNOSIS — G894 Chronic pain syndrome: Principal | ICD-10-CM

## 2024-02-07 DIAGNOSIS — M47816 Spondylosis without myelopathy or radiculopathy, lumbar region: Principal | ICD-10-CM

## 2024-02-07 DIAGNOSIS — M5417 Radiculopathy, lumbosacral region: Principal | ICD-10-CM

## 2024-02-07 DIAGNOSIS — M47812 Spondylosis without myelopathy or radiculopathy, cervical region: Principal | ICD-10-CM

## 2024-02-07 DIAGNOSIS — M170 Bilateral primary osteoarthritis of knee: Principal | ICD-10-CM

## 2024-02-07 MED ORDER — BACLOFEN 20 MG TABLET
ORAL_TABLET | ORAL | 2 refills | 0.00000 days | Status: CP
Start: 2024-02-07 — End: ?

## 2024-02-07 MED ORDER — OXYCODONE-ACETAMINOPHEN 10 MG-325 MG TABLET
ORAL_TABLET | ORAL | 0 refills | 30.00000 days | Status: CP | PRN
Start: 2024-02-07 — End: 2024-03-08

## 2024-02-07 MED ORDER — MILNACIPRAN 25 MG TABLET
ORAL_TABLET | Freq: Every day | ORAL | 2 refills | 30.00000 days | Status: CP
Start: 2024-02-07 — End: ?

## 2024-02-07 MED ORDER — GABAPENTIN 800 MG TABLET
ORAL_TABLET | Freq: Every day | ORAL | 0 refills | 90.00000 days | Status: CP
Start: 2024-02-07 — End: ?

## 2024-02-07 MED FILL — XOLAIR 150 MG/ML SUBCUTANEOUS SYRINGE: SUBCUTANEOUS | 28 days supply | Qty: 4 | Fill #4

## 2024-02-07 NOTE — Progress Notes (Signed)
 Medication:oxyCODONE -acetaminophen  (PERCOCET) 10-325 mg per tablet   SIG:PT DID NOT BRING BTL OF MEDICATION TO COUNT/DOCUMENT. PT STATED  MY BTL WAS EMPTY   Quantity on RX: #  Filled on:  Pill count today: #    Pt did not bring their empty pill bottle today.  I reinforced the need to bring their controlled medications, even if bottle empty, to each visit, including empty bottles and boxes.  Pt voiced understanding.

## 2024-02-07 NOTE — Progress Notes (Signed)
 Reviewed with patient, their AVS can be viewed through their My Chart.  Pt voiced understanding

## 2024-02-07 NOTE — Patient Instructions (Signed)
 It was good to see you today.    I have refilled your medications with no changes.    We will see you in 3 months, or sooner if needed.

## 2024-02-07 NOTE — Progress Notes (Signed)
 Department of Anesthesiology  [facility]  [address]  [place], [place] [address]  [phone]    Chronic Pain Follow Up Note  1. Chronic pain syndrome    2. Lumbosacral radiculopathy    3. 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 Spondylosis without myelopathy or radiculopathy, cervical region    6. Primary osteoarthritis of both knees    7. Pain medication agreement signed        Assessment and Plan  Attending: [name] is a 54 y.o. female who is being followed at the [facility] with a history of chronic pain localized to bilateral shoulder, knee as well as cervical thoracic and lumbar pain with history of L5-S1 spinal fusion and the pain is associated with bilateral radiculopathy. She was first seen in clinic in August 2018. She has a history of migraines and pseudotumor cerebri, followed by neurology and ophthalmology for treatment. Gets caudal ESI and TPIs to good effect. Compliance concerns in 2021 that led to additional oversight and then transitioning to Belbuca  in 10/2019. However, she did not tolerate belbuca  or nucynta  and was transitioned back to Percocet but with closer oversight and assistance from pain psychology. Between medications and procedures, she has been able to continue working. She now teaches water aerobics.   She's had a history of neck pain and previously responded to TPI, but more recently underwent cervical RFA.  Cervical RFA in March 2025 complicated by dural puncture and worsened lower extremity symptoms following, continuing to monitor.  Also has bilateral knee pain and follows with orthopedics and undergoes injections with steroids and HA.     January 2026  Today, the patient presents for regular chronic pain follow up.     Lumbosacral radiculopathy   Improved, s/p caudal ESI on 08/29/2023. 70% ongoing pain relief, especially for buttock throbbing. Notes pain reduced to knee down, which used to be the entire LLE. Also reports improved radicular pain in the left lower extremity since initiation of Savella  25 mg daily. Gabapentin  takes at HS 800 mg since she did not notice benefit with BID.   - Continue Savella  25 mg daily.  - Continue gabapentin  to 800 mg at night  - Continue lidocaine  ointment.  - Refill medications for three months.  - Continue Voltaren  gel  - Nortriptyline  50 mg at bedtime per Neurology (patient not taking)  - Continue oxycodone  prescription to allow dosing every four hours as needed for pain management given the increase in pain and to allow her to continue to be active, working, and taking care of her nephew.    - Encourage use of heat, ice, and stretching exercises to alleviate symptoms.    - Continue Voltaren  gel    Lumbar fusion    Lumbar fusion relevant to chronic pain management. No indication of acute issues related to lumbar fusion at this time.    Cervicalgia  Unchanged, managed with current regimen.   Continue treatment as above.  - Use over-the-counter ointment if insurance does not cover prescription.  - Continue use of heat, ice, and stretching exercises to alleviate symptoms.      Myofascial pain syndrome  She states she benefited after the most recent cervical TPIs. She will check with her insurance to ensure yearly coverage.   - Continue PT/HEP  - Continue Baclofen  20 mg TID, max dose  - Repeat cervical TPIs  PRN     Psychological stress related to caregiving  - Continue follow-up with pain psychologist Dr. Darrow.    Primary osteoarthritis of both knees  Managed with gel injections per ortho. Pain exacerbated by daily activities and prolonged standing. Previous injections provided relief for two years.  - Continue follow-up with orthopedic for knee injections as needed.     Chronic pain syndrome   Stable.  - Continue Percocet 10 mg every four hours, maximum six pills per day.   - UDS upto date  - Patient has been exercising with benefit.  - Continue current pain management regimen including baclofen , Voltaren  gel, Percocet as needed, and start savella     - Monitor and adjust medications as necessary based on symptom control and side effects.      Pill count: appropriately ran out   Last pain medication agreement on file and signed on 12/27/22, up dated today  Last urine toxicology: 12/27/22.  Appropriate.  [place]CCSRS database was reviewed 02/07/2024 and is appropriate:    COMM: 3 point on 02/07/2024   Last Opioid Change: Percocet 10-325 decreased from 5x daily PRN to 4x daily PRN 05/2016, 01/2018-increase to #132, several changes in Fall 2021 due to compliance concerns, 11/2019-back to percocet #90, 05/2020-back to #120, 04/2023-Up to #180, 06/2023-back to #120, 07/2023-back to #180  Previous Compliance Issues: Did not bring medications 10/2016, no show 04/2017, overtaking 06/2017, short on 09/27/17, lost Rx 10/2017, 01/2018-too late to be seen, 04/2019- 4 days short, but reports some tabs in med box at home. 07/2019 - short, states that she left 28 pills at home and 5 in her car. 08/2019- short, says she overtook during acute increase in pain in setting of acute sinusitis/strep throat. 09/2019- short, says she is unsure why her pill count is low and denies overuse. 10/2019 Did not bring Belbuca  to appointment, 11/28/2019 - Did not bring Percocet for pill count, 06/2021 - Did not bring Percocet  Oral Morphine Equivalents: 60-90  On a Benzodiazepine: no   Naloxone  last Ordered:  04/2023  NCCSRS database was reviewed 02/07/2024.      Return in about 3 months (around 05/04/2024).    Future Considerations:  Repeat caudal ESI  Ensure continued f/u with Dr. Garrel    Requested Prescriptions     Signed Prescriptions Disp Refills    gabapentin  (NEURONTIN ) 800 MG tablet 90 tablet 0     Sig: Take 1 tablet (800 mg total) by mouth in the morning.    baclofen  (LIORESAL ) 20 MG tablet 90 tablet 2     Sig: 10 - 20 mg three times a day as needed for spasms.    oxyCODONE -acetaminophen  (PERCOCET) 10-325 mg per tablet 180 tablet 0     Sig: Take 1 tablet by mouth every four (4) hours as needed for pain. Max 6 tabs/day. OK to fill 02/07/24    milnacipran  (SAVELLA ) 25 mg Tab 30 tablet 2     Sig: Take 1 tablet (25 mg total) by mouth in the morning.    oxyCODONE -acetaminophen  (PERCOCET) 10-325 mg per tablet 180 tablet 0     Sig: Take 1 tablet by mouth every four (4) hours as needed for pain. Max 6 tabs/day. OK to fill 03/08/24    oxyCODONE -acetaminophen  (PERCOCET) 10-325 mg per tablet 180 tablet 0     Sig: Take 1 tablet by mouth every four (4) hours as needed for pain. Max 6 tabs/day. OK to fill 04/07/24     Orders Placed This Encounter  Procedures    Misc nursing order (specify)     Please obtain treatment agreement     HPI  Natalie [name] is a 53 y.o. being followed at [facility] for complaint of chronic pain localized to her neck, upper back, lower back, bilateral hips and knees.      History of Present Illness  Natalie [name] is a 54 year old female who presents for medication management of chronic pain.    She has a history of chronic pain following surgery, initially managed with gabapentin . She started with a higher dose, taking four pills, which caused significant drowsiness. Over time, she reduced the dose to two pills and then to one pill at night, as she did not notice a difference in pain control between the two doses. She has been on this regimen for about a month. She also takes Savella  in the morning, which she finds effective in combination with gabapentin .    Her pain initially affected her entire left leg but is now localized from the knee down. She experiences numbness in her leg, particularly in the upper part, and describes her toes as feeling like they have 'little rubber bands' on them. The pain has improved since the caudal injection last year, which initially affected both feet and her left leg.    She has received knee injections with a gel substance, which were effective for about two years. However, with increased physical activity and standing at work, her knee pain has returned. She is currently working out every day and standing all day, which aggravates her knee pain.    She is currently taking gabapentin  once at night. She reports no side effects from baclofen . She also has Narcan  at home.    She has a history of headaches, which she initially attributed to neck issues. She now reports that her headaches have improved since her blood pressure has been controlled with medication. She monitors her blood pressure at home.    Patients current medication regimen:  - Percocet 10-325 mg q4 prn   - Gabapentin  800 mg HS  - Voltaren  gel-using regularly  - Lidocaine  patch OTC  - Baclofen  20mg  TID  - Nortriptyline  50 mg at bedtime per neurology (not taking, d/t drowsiness)    [facility] Return Patient Questionnaire    Question 02/04/2024  1:11 AM EST - Filed by Patient   What is the reason for your visit? Medication Refill   Date of onset of your pain: 08/12/1997   Please rate your pain at its WORST in the past month. 10   Please rate your pain at its LEAST in the past month. 5   Please rate your pain as it is RIGHT NOW. 8   Please rate your pain on AVERAGE in the past month. 8   Please circle the location of your pain. Drawing on 02/04/2024 at 1:05 AM EST   Please select the words that describe your pain. Aching    Burning    Dull    Nauseating    Painful Cold    Pressing    Pulling    Pulsing    Sharp    shooting    Sore    Stabbing Tender Throbbing   How often do you have pain? All the time   When is your pain the worst? During the day   Which of the following have been negatively affected by your pain? Enjoyment of life  General activity    Mood    Normal work    Recreational activities    Relationships with people    Sleep    Walking    Sitting    Standing   Since your last visit:    Have you had any of the following? Primary Care Visit    Mental Health visit   Do you have any new pain you would like to discuss with your doctor? No   How has your pain changed? Stayed the same   Are you currently taking any blood-thinners or anticoagulants? No   If you are on Pain Medication - Are you having any of the following? None    I am stable on my current medication regime   If you have had a procedure since your last visit, how much pain relief was obtained? 50%   If you have had a procedure since your last visit, were there any complications? Yes   General: Snoring    Night Sweats    Fatigue    Difficulty Sleeping   Cardiovascular:    Gastrointestinal - (Intestinal): Heartburn   Skin: Rash    Hives    Itching   Endocrine (Hormonal System): Increased Sweating    Hot Flashes    Excess Urination   Musculoskeletal System - (Muscles, Joints and Coverings): Back pain   Neurologic: Headaches/Migraines   Psychiatric:      [facility] Patient-Entered Hpi Selection Questionnaire    Question 02/04/2024  1:18 AM EST - Filed by Patient   What is the primary reason for your visit? Back Pain   Your back pain Has lasted for 3 months or more   When did you first notice your back pain? More than 1 month ago   How often do you feel back pain? Constantly   Since you first noticed your back pain, how has it changed? Unchanged   Where is your back pain located? Buttocks    Lower back - above the waist    Lower back - below the waist    Upper back   How would you describe your back pain? Aching    Burning    Cramping    Shooting    Stabbing   Where does your back pain spread? Left foot    Left knee    Right foot    Right knee   On a scale of 0 to 10 (10 being the worst), how strong is your back pain? 9   Your back pain is... the same all the time   What makes your back pain worse? Bending    Certain positions    Lying down    Standing   When do you feel stiffness in your back? All day   Are you experiencing any of the following symptoms with your back pain?    Abdominal pain No   Bladder incontinence No   Bowel incontinence No   Chest pain No   Fever No   Headaches Yes   Leg pain No   Numbness No   Numbness around the anus    Painful urination    Paralysis    Pelvic pain No   Pins and needles No   Tingling No   Weakness    Weight loss    Do any of the following apply to you? Menopause    Overweight    Poor posture       Review Of Systems  See questionnaires  Allergies  Allergies   Allergen Reactions    Boric Acid Hives    Buprenorphine  Hcl Itching and Swelling    Pregabalin Swelling and Other (See Comments)     Mouth swells    Other reaction(s): Other (See Comments)   Mouth swells   Mouth swells    Cephalexin  Monohydrate     Doxycycline      Oxycodone  Hcl     Adhesive Tape-Silicones Itching     Band-aids ok.    Doxycycline  Hyclate (Bulk) Nausea And Vomiting     GI Upset    Keflex  [Cephalexin ] Rash    Morphine (Bulk) Itching    Opioids - Morphine Analogues Itching    Oxycodone -Acetaminophen  Itching, Nausea And Vomiting and Other (See Comments)     GI Upset- GENERIC ONLY- able to take the brand name Percocets    Generic percocet only-Itching and gastritis    Other reaction(s): Other (See Comments)   Generic percocet only-Itching and gastritis    Generic percocet only-Itching and gastritis     Home Medications    Current Outpatient Medications   Medication Sig Dispense Refill    albuterol  HFA 90 mcg/actuation inhaler Inhale 2 puffs every six (6) hours as needed for wheezing or shortness of breath. 54 g 0    amlodipine  (NORVASC ) 5 MG tablet Take 1 tablet (5 mg total) by mouth daily. 30 tablet 1    azelastine  (ASTELIN ) 137 mcg (0.1 %) nasal spray 2 sprays into each nostril two (2) times a day. 30 mL 5    azelastine  (OPTIVAR ) 0.05 % ophthalmic solution Administer 2 drops to both eyes daily as needed. 6 mL 0    baclofen  (LIORESAL ) 20 MG tablet 10 - 20 mg three times a day as needed for spasms. 90 tablet 2    cholecalciferol , vitamin D3-1,250 mcg, 50,000 unit,, 1,250 mcg (50,000 unit) capsule 1 capsule PO Weekly x 12 weeks.  After that, cut down to over-the-counter vitamin D 2000 units daily. (Patient not taking: Reported on 01/25/2024) 12 capsule 0    dicyclomine  (BENTYL ) 10 mg/mL injection inject 2 milliliter by intramuscular route 4 times every day      empty container Misc Use as directed to dispose of Xolair  syringes 1 each 2    EPINEPHrine  (EPIPEN ) 0.3 mg/0.3 mL injection Inject 0.3 mL (0.3 mg total) into the muscle once as needed for anaphylaxis (Difficulty breathing, throat closing, etc) for up to 1 dose. 1 each 12    erythromycin (ROMYCIN) 5 mg/gram (0.5 %) ophthalmic ointment 1 application. (1 Application total).      estradiol  (ESTRACE ) 1 MG tablet Take 1 tablet (1 mg total) by mouth daily. 30 tablet 0    famotidine  (PEPCID ) 20 MG tablet Take 1 tablet (20 mg total) by mouth two (2) times a day as needed for heartburn. 60 tablet 1    gabapentin  (NEURONTIN ) 800 MG tablet TAKE 1 TABLET(800 MG) BY MOUTH TWICE DAILY 180 tablet 0    hydroxychloroquine  (PLAQUENIL ) 200 mg tablet Take 1 tablet (200 mg total) by mouth two (2) times a day. 60 tablet 12    ipratropium (ATROVENT ) 21 mcg (0.03 %) nasal spray 2 sprays into each nostril four (4) times a day. 30 mL 1    lidocaine  (LIDODERM ) 5 % patch PLACE 1 PATCH ON THE SKIN DAILY. APPLY UP TO 1 PATCH AT A TIME. USE FOR UP TO 12 HOURS/DAY 30 patch 2    lidocaine  (XYLOCAINE ) 5 % ointment Apply 2-4 grams(1-2 inch ribbon) to  affected areas up to three times a day as needed for pain. 200 g 2    milnacipran  (SAVELLA ) 25 mg Tab Take 1 tablet (25 mg total) by mouth in the morning. 30 tablet 0    naloxone  (NARCAN ) 4 mg nasal spray 1 spray into alternating nostrils once as needed (opioid overdose.). One spray in either nostril once for known/suspected opioid overdose. May repeat every 2-3 minutes in alternating nostril til EMS arrives 2 each 2    omalizumab  (XOLAIR ) 150 mg/mL syringe Inject 2 syringes (300 mg total) under the skin every fourteen (14) days. 4 mL 11    oxyCODONE -acetaminophen  (PERCOCET) 10-325 mg per tablet Take 1 tablet by mouth every four (4) hours as needed for pain. Max 6 tabs/day. OK to fill 01/07/24 180 tablet 0    pantoprazole  (PROTONIX ) 40 MG tablet TAKE 1 TABLET(40 MG) BY MOUTH DAILY 30 MINUTES BEFORE BREAKFAST AS NEEDED 90 tablet 3    potassium chloride  20 mEq TbER TAKE 1 TABLET BY MOUTH DAILY 90 tablet 3    progesterone  (PROMETRIUM ) 100 MG capsule Take 1 capsule (100 mg total) by mouth daily. 30 capsule 0    rosuvastatin  (CRESTOR ) 10 MG tablet Take 1 tablet (10 mg total) by mouth nightly. 90 tablet 3    ubrogepant  (UBRELVY ) 50 mg tablet TAKE 1 TABLET BY MOUTH FOR HEADACHE. MAY REPEAT IN 2 HOURS IF NEEDED. LIMIT 2 TABLETS PER DAY AND NO MORE THAN 4 TABLETS PER WEEK 16 tablet 0    XHANCE  93 mcg/actuation AerB USE 1 SPRAY INTO EACH NOSTRIL TWICE DAILY AS NEEDED 16 mL 11     No current facility-administered medications for this visit.     Previous Medication Trials:  NSAIDS- celebrex , ibuprofen , naproxen ,   Antidepressants-   Anticonvulsant- gabapentin , pregabalin,   Muscle relaxants- baclofen , flexeril , valium, skelaxin, robaxin, tizanidine   Topicals-voltaren  gel, lidoderm ,   Short-acting opiates-hydromorphone , percocet, oxycodone , tramadol, ultracet, vicodin, nucynta  IR  Long-acting opiates- fentanyl , morphine, methadone, oxycontin . Sedation with long acting opioids. Belbuca  (side effects)  Anxiolytics-   Other-     Previous Interventions  PT/Aquatic therapy/TENS/Meidcation/Epidural injections/Nerve Block/ Other injections/Surgery/Psychological Counseling  Before our clinic:  - L3-5 MBBB 11/2015 with good benefit; repeat did not help per patient  - B/l trochanteric injections 10/2014  - TPI 11/2015 upper neck to lower back  - B/l knee injections 2017  - Caudal epidural steroid injection (unsure of timing) with improvement in radiculopathy symptoms  Previous tests include MRI, XRAYs, CT scan and NCS/EMG  [facility]  Caudal ESI 10/20/16, 01/2017, 07/14/17, 10/27/17, 03/22/18, 11/16/18, 05/25/19,11/26/19  Knee injection-[place] 10/22/16  TPI 10/28/16, 01/2017, 07/14/17, 08/11/17, 10/27/17, 03/22/18, 12/17/19  Lumbar L2, L3, L4 MBB - >80% relief x2 in October 2022  Lumbar L2, L3, L4 RFA - 12/10/20 - initial flare-likely neuritis. Can assess efficacy longer term.  PT-07/2018  Bilateral knee IA CSI-Sports med 02/16/19  Trigger point injections 03/05/20 and 04/21/20, 05/30/20, 07/09/20, 08/21/20, 09/25/20, 11/05/20  Caudal ESI 03/19/20, 08/11/20, 08/27/21, 08/29/23  TENS unit-ordered 05/2020  12/24/21-bilateral GTB CST ortho  11/26/21-ortho knee CSI  11/19/21-ortho knee CSI  Cervical RFA 05/11/22  TPI 08/31/22  PT referral 08/2022  Cervical RFA 04/13/23-dural puncture, neuritis  Ortho 07/25/23-HA series bilateral knees,   Ortho 06/27/23-bilateral knee CSI,     Imaging/Labs:  Lab Results   Component Value Date    PLT 210 01/21/2024     Lab Results   Component Value Date    CREATININE 0.66 01/21/2024     No  results found for: PT, INR  No results found for: APTT  No results found for: A1C  Lab Results   Component Value Date    ALKPHOS 54 01/11/2024    BILITOT 0.6 01/11/2024    PROT 8.1 01/11/2024    ALBUMIN 4.2 01/11/2024    ALT 14 01/11/2024    AST 20 01/11/2024         Physical Exam    VITALS:   Vitals:    02/07/24 0903   BP: 138/87   Pulse: 85   Resp: 16     Wt Readings from Last 3 Encounters:   02/03/24 74.4 kg (164 lb)   01/27/24 74.4 kg (164 lb)   01/25/24 74.5 kg (164 lb 3.2 oz)     GENERAL:  The patient is well developed, well-nourished, and appears to be in no apparent distress.   HEAD/NECK:    Normocephalic/atraumatic. clear sclera, pupils not pinpoint  CV:  Warm and well perfused.   LUNGS:   Normal work of breathing, no supplemental O2  EXTREMITIES:  No clubbing, cyanosis noted.  NEUROLOGIC:    The patient is alert and oriented, speech fluent, normal language.   MUSCULOSKELETAL:    Motor function  preserved.   GAIT:  The patient rises from a seated position with no difficulty and ambulates with nonantalgic gait without the assistance of a walking aid.   SKIN:   No obvious rashes, lesions, or erythema.  PSY:   Appropriate affect. No overt pain behaviors. No evidence of psychomotor retardation or agitation, no signs of intoxication.     We are delivering comprehensive, continuous, longitudinal care for this patient with chronic pain.

## 2024-02-09 NOTE — Progress Notes (Signed)
 Left knee severe medial compartment osteoarthritis, Right knee osteoarthritis; Gelsyn injection #3/3 (Dr. Venice) (left > right, last injection series 06/2023, 12/2022, 03/2022, 07/2021 with good relief)     We discussed the Gelsyn injection series for the bilateral knee and the patient desired to proceed.     Procedure: After sterile prep, 2ml Gelsyn was injected into the left knee. The patient tolerated the procedure well.     Procedure: After sterile prep, 2ml Gelsyn was injected into the right knee. The patient tolerated the procedure well.     Follow-up in 8 weeks as needed.

## 2024-02-10 ENCOUNTER — Ambulatory Visit: Admit: 2024-02-10 | Discharge: 2024-02-11 | Payer: Medicare (Managed Care)

## 2024-02-10 DIAGNOSIS — M170 Bilateral primary osteoarthritis of knee: Principal | ICD-10-CM

## 2024-02-10 MED ADMIN — sodium hyaluronate (viscosup) (GELSYN-3) 16.8 mg/2 mL injection 16.8 mg: 16.8 mg | INTRA_ARTICULAR | @ 14:00:00 | Stop: 2024-02-10

## 2024-02-12 DIAGNOSIS — Z79890 Hormone replacement therapy (postmenopausal): Principal | ICD-10-CM

## 2024-02-12 DIAGNOSIS — N951 Menopausal and female climacteric states: Principal | ICD-10-CM

## 2024-02-12 MED ORDER — PROGESTERONE MICRONIZED 100 MG CAPSULE
ORAL_CAPSULE | Freq: Every day | ORAL | 0 refills | 0.00000 days
Start: 2024-02-12 — End: ?

## 2024-02-12 MED ORDER — ESTRADIOL 1 MG TABLET
ORAL_TABLET | Freq: Every day | ORAL | 0 refills | 0.00000 days
Start: 2024-02-12 — End: ?

## 2024-02-12 NOTE — Telephone Encounter (Signed)
 Patient is requesting the following refill  Requested Prescriptions     Pending Prescriptions Disp Refills    progesterone  (PROMETRIUM ) 100 MG capsule [Pharmacy Med Name: PROGESTERONE  MICRO 100MG  CAPSULES] 30 capsule 0     Sig: TAKE 1 CAPSULE(100 MG) BY MOUTH DAILY    estradiol  (ESTRACE ) 1 MG tablet [Pharmacy Med Name: ESTRADIOL  1MG  TABLETS] 30 tablet 0     Sig: TAKE 1 TABLET(1 MG) BY MOUTH DAILY     Last Fill: 01/10/2024 Estradiol  30- 0 refills                12/162025 Progesterone  #0 -0 refills     01/10/2024 visit with Dr. Charmaine  Menopausal syndrome on hormone replacement therapy  Menopausal symptoms improved with hormone therapy. Requires medication refill.  - Refilled hormone replacement therapy for a month.  - Advised follow-up with gynecologist for ongoing management.      Interface, Surescripts In routed this conversation to [employer] [facility] Staff  Lola Zebedee HERO, LPN  TD    87/8/74  8:20 AM  Note      Please schedule your yearly appointment for refill/annual exam. Thank you        Recent Visits  Date Type Provider Dept   01/25/24 Office Visit Irene Alan Murray, FNP [facility]   01/10/24 Office Visit Neelagiri, Montel, MD [facility]   12/02/23 Telemedicine Neelagiri, Montel, MD [facility]   10/03/23 Office Visit Neelagiri, Montel, MD [place] [facility]   08/29/23 Office Visit Neelagiri, Montel, MD [name]   07/05/23 Office Visit Neelagiri, Montel, MD [facility]   06/13/23 Telemedicine Neelagiri, Montel, MD [place] [facility]   05/18/23 Office Visit Neelagiri, Montel, MD [facility] [facility]   02/23/23 Office Visit Neelagiri, Montel, MD [place] [facility]   Showing recent visits within past 365 days and meeting all other requirements  Future Appointments  Date Type Provider Dept   03/07/24 Appointment Neelagiri, Montel, MD [place] [facility]   Showing future appointments within next 365 days and meeting all other requirements       Labs: Not applicable this refill

## 2024-02-13 MED ORDER — PROGESTERONE MICRONIZED 100 MG CAPSULE
ORAL_CAPSULE | Freq: Every day | ORAL | 0 refills | 30.00000 days
Start: 2024-02-13 — End: ?

## 2024-02-13 MED ORDER — ESTRADIOL 1 MG TABLET
ORAL_TABLET | Freq: Every day | ORAL | 0 refills | 30.00000 days
Start: 2024-02-13 — End: ?

## 2024-02-13 NOTE — Telephone Encounter (Signed)
 Provided a short supply in Dec 2025. She is supposed to get the refills from her GYN.

## 2024-02-20 NOTE — Telephone Encounter (Signed)
 Sent mychart message stating blood pressure was high with her heart beating really fast.   She Notices it happens at night. When she is in bed go on until she falls asleep.     It happens minor throughout the day.     She stated she did make an appointment with amanda D to be evaluated Wednesday.     She was advised if it gets worse or if she has shortness of breath or cannot breath she should go to the emergency room.
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
## 2024-02-22 ENCOUNTER — Ambulatory Visit: Admit: 2024-02-22 | Discharge: 2024-02-23

## 2024-02-22 DIAGNOSIS — F1120 Opioid dependence, uncomplicated: Secondary | ICD-10-CM

## 2024-02-22 DIAGNOSIS — M4316 Spondylolisthesis, lumbar region: Secondary | ICD-10-CM

## 2024-02-22 DIAGNOSIS — G894 Chronic pain syndrome: Secondary | ICD-10-CM

## 2024-02-22 DIAGNOSIS — I10 Essential (primary) hypertension: Secondary | ICD-10-CM

## 2024-02-22 DIAGNOSIS — R110 Nausea: Secondary | ICD-10-CM

## 2024-02-22 DIAGNOSIS — M47812 Spondylosis without myelopathy or radiculopathy, cervical region: Secondary | ICD-10-CM

## 2024-02-22 MED ORDER — ONDANSETRON 4 MG DISINTEGRATING TABLET
ORAL_TABLET | 0 refills | 0.00000 days | Status: CP
Start: 2024-02-22 — End: ?

## 2024-02-27 DIAGNOSIS — G43E01 Chronic migraine with aura, not intractable, with status migrainosus: Secondary | ICD-10-CM

## 2024-02-27 DIAGNOSIS — M542 Cervicalgia: Principal | ICD-10-CM

## 2024-02-27 MED ORDER — UBRELVY 50 MG TABLET
ORAL_TABLET | ORAL | 0 refills | 0.00000 days | Status: CP
Start: 2024-02-27 — End: ?

## 2024-02-27 NOTE — Addendum Note (Signed)
 Addended by: DELORES LISTER A on: 02/27/2024 11:51 AM     Modules accepted: Orders
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
## 2024-02-28 DIAGNOSIS — M791 Myalgia, unspecified site: Principal | ICD-10-CM

## 2024-02-28 NOTE — Progress Notes (Unsigned)
 Patient is requesting repeat Cervical TPI     Where is the pain locate?   Pain is in my neck mid back,  lower back    Is the muscle in the area tight? yes    Does the pain radiate/travel to other parts of your body? Yes  Where? Left leg , left foot    Is there decreased range of motion (are you able to move the body part like it's supposed to move)? Yes    What have you been doing to help with the pain (example: heat, ice, home therapy, PT, exercise, massage, ultrasound, chiropractor, activity modification)? Ice pack and stretch massage     How long have you been doing these treatments? (Must be for 6 weeks and failed or it's not feasible)? Since last year but got worse since 02/13/24

## 2024-02-29 ENCOUNTER — Encounter
Admit: 2024-02-29 | Discharge: 2024-03-01 | Payer: Medicare (Managed Care) | Attending: Psychologist | Primary: Psychologist

## 2024-02-29 DIAGNOSIS — F1190 Opioid use, unspecified, uncomplicated: Secondary | ICD-10-CM

## 2024-02-29 DIAGNOSIS — F419 Anxiety disorder, unspecified: Principal | ICD-10-CM
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
## 2024-03-01 DIAGNOSIS — M47812 Spondylosis without myelopathy or radiculopathy, cervical region: Secondary | ICD-10-CM

## 2024-03-01 DIAGNOSIS — M47816 Spondylosis without myelopathy or radiculopathy, lumbar region: Secondary | ICD-10-CM

## 2024-03-01 DIAGNOSIS — M7918 Myalgia, other site: Principal | ICD-10-CM

## 2024-03-01 DIAGNOSIS — M5417 Radiculopathy, lumbosacral region: Secondary | ICD-10-CM

## 2024-03-01 MED ORDER — BACLOFEN 20 MG TABLET
ORAL_TABLET | 2 refills | 0.00000 days
Start: 2024-03-01 — End: ?
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
## 2024-03-08 MED ORDER — OXYCODONE-ACETAMINOPHEN 10 MG-325 MG TABLET
ORAL_TABLET | ORAL | 0 refills | 30.00000 days | Status: CP | PRN
Start: 2024-03-08 — End: 2024-04-07

## 2024-04-07 MED ORDER — OXYCODONE-ACETAMINOPHEN 10 MG-325 MG TABLET
ORAL_TABLET | ORAL | 0 refills | 30.00000 days | Status: CP | PRN
Start: 2024-04-07 — End: ?
